# Patient Record
Sex: Male | Born: 1955
Health system: Southern US, Community
[De-identification: ages and names within clinical notes are randomized; demographics above are authoritative.]

## PROBLEM LIST (undated history)

## (undated) DIAGNOSIS — L438 Other lichen planus: Secondary | ICD-10-CM

## (undated) DIAGNOSIS — K219 Gastro-esophageal reflux disease without esophagitis: Secondary | ICD-10-CM

## (undated) DIAGNOSIS — K589 Irritable bowel syndrome without diarrhea: Secondary | ICD-10-CM

## (undated) DIAGNOSIS — I10 Essential (primary) hypertension: Secondary | ICD-10-CM

## (undated) DIAGNOSIS — F1020 Alcohol dependence, uncomplicated: Secondary | ICD-10-CM

## (undated) DIAGNOSIS — R7689 Other specified abnormal immunological findings in serum: Secondary | ICD-10-CM

## (undated) DIAGNOSIS — L400 Psoriasis vulgaris: Secondary | ICD-10-CM

## (undated) DIAGNOSIS — F1910 Other psychoactive substance abuse, uncomplicated: Secondary | ICD-10-CM

## (undated) DIAGNOSIS — G9340 Encephalopathy, unspecified: Secondary | ICD-10-CM

## (undated) DIAGNOSIS — Z9889 Other specified postprocedural states: Secondary | ICD-10-CM

## (undated) DIAGNOSIS — F419 Anxiety disorder, unspecified: Secondary | ICD-10-CM

## (undated) DIAGNOSIS — M3500 Sicca syndrome, unspecified: Secondary | ICD-10-CM

## (undated) DIAGNOSIS — H269 Unspecified cataract: Secondary | ICD-10-CM

## (undated) DIAGNOSIS — D696 Thrombocytopenia, unspecified: Secondary | ICD-10-CM

## (undated) DIAGNOSIS — E7800 Pure hypercholesterolemia, unspecified: Secondary | ICD-10-CM

## (undated) DIAGNOSIS — D72819 Decreased white blood cell count, unspecified: Secondary | ICD-10-CM

## (undated) DIAGNOSIS — B1910 Unspecified viral hepatitis B without hepatic coma: Secondary | ICD-10-CM

## (undated) DIAGNOSIS — F32A Depression, unspecified: Secondary | ICD-10-CM

## (undated) DIAGNOSIS — R768 Other specified abnormal immunological findings in serum: Secondary | ICD-10-CM

## (undated) DIAGNOSIS — Z86010 Personal history of colonic polyps: Secondary | ICD-10-CM

## (undated) DIAGNOSIS — IMO0001 Reserved for inherently not codable concepts without codable children: Secondary | ICD-10-CM

## (undated) DIAGNOSIS — F329 Major depressive disorder, single episode, unspecified: Secondary | ICD-10-CM

## (undated) DIAGNOSIS — T7840XA Allergy, unspecified, initial encounter: Secondary | ICD-10-CM

## (undated) DIAGNOSIS — B1920 Unspecified viral hepatitis C without hepatic coma: Secondary | ICD-10-CM

## (undated) DIAGNOSIS — Z8719 Personal history of other diseases of the digestive system: Secondary | ICD-10-CM

## (undated) DIAGNOSIS — L439 Lichen planus, unspecified: Secondary | ICD-10-CM

## (undated) DIAGNOSIS — M109 Gout, unspecified: Secondary | ICD-10-CM

## (undated) DIAGNOSIS — M539 Dorsopathy, unspecified: Secondary | ICD-10-CM

## (undated) DIAGNOSIS — M25569 Pain in unspecified knee: Secondary | ICD-10-CM

## (undated) DIAGNOSIS — M1990 Unspecified osteoarthritis, unspecified site: Secondary | ICD-10-CM

## (undated) HISTORY — PX: KNEE ARTHROSCOPY: SHX127

## (undated) HISTORY — DX: Unspecified cataract: H26.9

## (undated) HISTORY — DX: Dorsopathy, unspecified: M53.9

## (undated) HISTORY — DX: Personal history of other diseases of the digestive system: Z87.19

## (undated) HISTORY — DX: Essential (primary) hypertension: I10

## (undated) HISTORY — DX: Allergy, unspecified, initial encounter: T78.40XA

## (undated) HISTORY — DX: Other specified abnormal immunological findings in serum: R76.89

## (undated) HISTORY — PX: UPPER GASTROINTESTINAL ENDOSCOPY: SHX188

## (undated) HISTORY — PX: CARPAL TUNNEL RELEASE: SHX101

## (undated) HISTORY — DX: Reserved for inherently not codable concepts without codable children: IMO0001

## (undated) HISTORY — PX: ULNAR NERVE TRANSPOSITION: SHX2595

## (undated) HISTORY — DX: Thrombocytopenia, unspecified: D69.6

## (undated) HISTORY — PX: TONSILLECTOMY: SUR1361

## (undated) HISTORY — DX: Decreased white blood cell count, unspecified: D72.819

## (undated) HISTORY — DX: Irritable bowel syndrome, unspecified: K58.9

## (undated) HISTORY — DX: Personal history of other diseases of the digestive system: Z98.890

## (undated) HISTORY — DX: Depression, unspecified: F32.A

## (undated) HISTORY — DX: Personal history of colonic polyps: Z86.010

## (undated) HISTORY — DX: Lichen planus, unspecified: L43.9

## (undated) HISTORY — PX: OTHER SURGICAL HISTORY: SHX169

## (undated) HISTORY — DX: Alcohol dependence, uncomplicated: F10.20

## (undated) HISTORY — DX: Other lichen planus: L43.8

## (undated) HISTORY — DX: Gastro-esophageal reflux disease without esophagitis: K21.9

## (undated) HISTORY — DX: Pure hypercholesterolemia, unspecified: E78.00

## (undated) HISTORY — DX: Unspecified viral hepatitis B without hepatic coma: B19.10

## (undated) HISTORY — DX: Psoriasis vulgaris: L40.0

## (undated) HISTORY — DX: Other specified abnormal immunological findings in serum: R76.8

## (undated) HISTORY — DX: Major depressive disorder, single episode, unspecified: F32.9

## (undated) HISTORY — PX: FRACTURE SURGERY: SHX138

## (undated) HISTORY — PX: SPINE SURGERY: SHX786

## (undated) HISTORY — DX: Unspecified viral hepatitis C without hepatic coma: B19.20

## (undated) HISTORY — PX: COLONOSCOPY: SHX174

## (undated) HISTORY — PX: APPENDECTOMY: SHX54

## (undated) HISTORY — DX: Other psychoactive substance abuse, uncomplicated: F19.10

## (undated) HISTORY — DX: Anxiety disorder, unspecified: F41.9

## (undated) HISTORY — DX: Unspecified osteoarthritis, unspecified site: M19.90

## (undated) HISTORY — DX: Pain in unspecified knee: M25.569

---
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
## 1977-08-10 DIAGNOSIS — B1920 Unspecified viral hepatitis C without hepatic coma: Secondary | ICD-10-CM

## 1977-08-10 HISTORY — DX: Unspecified viral hepatitis C without hepatic coma: B19.20

## 1998-06-06 ENCOUNTER — Ambulatory Visit (HOSPITAL_BASED_OUTPATIENT_CLINIC_OR_DEPARTMENT_OTHER): Admission: RE | Admit: 1998-06-06 | Discharge: 1998-06-06 | Payer: Self-pay | Admitting: Orthopedic Surgery

## 2003-05-24 ENCOUNTER — Ambulatory Visit (HOSPITAL_COMMUNITY): Admission: RE | Admit: 2003-05-24 | Discharge: 2003-05-24 | Payer: Self-pay | Admitting: *Deleted

## 2003-05-24 ENCOUNTER — Encounter: Payer: Self-pay | Admitting: Otolaryngology

## 2003-08-08 ENCOUNTER — Ambulatory Visit (HOSPITAL_COMMUNITY): Admission: RE | Admit: 2003-08-08 | Discharge: 2003-08-08 | Payer: Self-pay | Admitting: Family Medicine

## 2006-02-14 ENCOUNTER — Emergency Department (HOSPITAL_COMMUNITY): Admission: EM | Admit: 2006-02-14 | Discharge: 2006-02-14 | Payer: Self-pay | Admitting: Emergency Medicine

## 2006-04-01 ENCOUNTER — Ambulatory Visit: Payer: Self-pay | Admitting: Gastroenterology

## 2006-05-11 ENCOUNTER — Encounter (HOSPITAL_COMMUNITY): Admission: RE | Admit: 2006-05-11 | Discharge: 2006-06-10 | Payer: Self-pay | Admitting: Endocrinology

## 2006-08-02 ENCOUNTER — Emergency Department (HOSPITAL_COMMUNITY): Admission: EM | Admit: 2006-08-02 | Discharge: 2006-08-02 | Payer: Self-pay | Admitting: Emergency Medicine

## 2006-08-10 DIAGNOSIS — Z860101 Personal history of adenomatous and serrated colon polyps: Secondary | ICD-10-CM

## 2006-08-10 DIAGNOSIS — Z86010 Personal history of colonic polyps: Secondary | ICD-10-CM

## 2006-08-10 HISTORY — DX: Personal history of adenomatous and serrated colon polyps: Z86.0101

## 2006-08-10 HISTORY — DX: Personal history of colonic polyps: Z86.010

## 2006-09-30 ENCOUNTER — Ambulatory Visit: Payer: Self-pay | Admitting: Gastroenterology

## 2006-10-15 ENCOUNTER — Ambulatory Visit (HOSPITAL_COMMUNITY): Admission: RE | Admit: 2006-10-15 | Discharge: 2006-10-15 | Payer: Self-pay | Admitting: Family Medicine

## 2006-10-20 ENCOUNTER — Ambulatory Visit (HOSPITAL_COMMUNITY): Admission: RE | Admit: 2006-10-20 | Discharge: 2006-10-20 | Payer: Self-pay | Admitting: Family Medicine

## 2006-10-28 ENCOUNTER — Encounter (INDEPENDENT_AMBULATORY_CARE_PROVIDER_SITE_OTHER): Payer: Self-pay | Admitting: *Deleted

## 2006-10-28 ENCOUNTER — Ambulatory Visit (HOSPITAL_COMMUNITY): Admission: RE | Admit: 2006-10-28 | Discharge: 2006-10-28 | Payer: Self-pay | Admitting: Gastroenterology

## 2006-10-30 ENCOUNTER — Emergency Department (HOSPITAL_COMMUNITY): Admission: EM | Admit: 2006-10-30 | Discharge: 2006-10-30 | Payer: Self-pay | Admitting: Emergency Medicine

## 2006-11-04 ENCOUNTER — Ambulatory Visit: Payer: Self-pay | Admitting: Urgent Care

## 2006-11-04 ENCOUNTER — Ambulatory Visit (HOSPITAL_COMMUNITY): Admission: RE | Admit: 2006-11-04 | Discharge: 2006-11-04 | Payer: Self-pay | Admitting: Family Medicine

## 2006-11-09 ENCOUNTER — Ambulatory Visit (HOSPITAL_COMMUNITY): Admission: RE | Admit: 2006-11-09 | Discharge: 2006-11-09 | Payer: Self-pay | Admitting: Gastroenterology

## 2006-11-09 ENCOUNTER — Emergency Department (HOSPITAL_COMMUNITY): Admission: EM | Admit: 2006-11-09 | Discharge: 2006-11-09 | Payer: Self-pay | Admitting: Emergency Medicine

## 2006-11-13 ENCOUNTER — Emergency Department (HOSPITAL_COMMUNITY): Admission: EM | Admit: 2006-11-13 | Discharge: 2006-11-13 | Payer: Self-pay | Admitting: Emergency Medicine

## 2006-11-16 ENCOUNTER — Ambulatory Visit: Payer: Self-pay | Admitting: Gastroenterology

## 2006-11-23 ENCOUNTER — Emergency Department (HOSPITAL_COMMUNITY): Admission: EM | Admit: 2006-11-23 | Discharge: 2006-11-23 | Payer: Self-pay | Admitting: Emergency Medicine

## 2006-11-30 ENCOUNTER — Ambulatory Visit: Payer: Self-pay | Admitting: Gastroenterology

## 2006-11-30 ENCOUNTER — Emergency Department (HOSPITAL_COMMUNITY): Admission: EM | Admit: 2006-11-30 | Discharge: 2006-11-30 | Payer: Self-pay | Admitting: Emergency Medicine

## 2006-12-03 ENCOUNTER — Ambulatory Visit (HOSPITAL_COMMUNITY): Admission: RE | Admit: 2006-12-03 | Discharge: 2006-12-03 | Payer: Self-pay | Admitting: Family Medicine

## 2006-12-07 ENCOUNTER — Ambulatory Visit: Payer: Self-pay | Admitting: Gastroenterology

## 2006-12-08 ENCOUNTER — Ambulatory Visit (HOSPITAL_COMMUNITY): Admission: RE | Admit: 2006-12-08 | Discharge: 2006-12-08 | Payer: Self-pay | Admitting: Gastroenterology

## 2006-12-08 ENCOUNTER — Ambulatory Visit: Payer: Self-pay | Admitting: Gastroenterology

## 2006-12-08 ENCOUNTER — Encounter (INDEPENDENT_AMBULATORY_CARE_PROVIDER_SITE_OTHER): Payer: Self-pay | Admitting: Specialist

## 2007-01-10 ENCOUNTER — Ambulatory Visit: Payer: Self-pay | Admitting: Internal Medicine

## 2007-01-18 ENCOUNTER — Ambulatory Visit: Payer: Self-pay | Admitting: Gastroenterology

## 2007-01-31 ENCOUNTER — Ambulatory Visit (HOSPITAL_COMMUNITY): Admission: RE | Admit: 2007-01-31 | Discharge: 2007-01-31 | Payer: Self-pay | Admitting: Family Medicine

## 2007-02-07 ENCOUNTER — Ambulatory Visit: Payer: Self-pay | Admitting: Gastroenterology

## 2007-02-07 ENCOUNTER — Ambulatory Visit: Payer: Self-pay | Admitting: Orthopedic Surgery

## 2007-02-21 ENCOUNTER — Ambulatory Visit: Payer: Self-pay | Admitting: Orthopedic Surgery

## 2007-02-22 ENCOUNTER — Encounter: Payer: Self-pay | Admitting: Orthopedic Surgery

## 2007-02-24 ENCOUNTER — Encounter (HOSPITAL_COMMUNITY): Admission: RE | Admit: 2007-02-24 | Discharge: 2007-03-26 | Payer: Self-pay | Admitting: Orthopedic Surgery

## 2007-03-10 ENCOUNTER — Ambulatory Visit: Payer: Self-pay | Admitting: Gastroenterology

## 2007-03-15 ENCOUNTER — Ambulatory Visit: Payer: Self-pay | Admitting: Gastroenterology

## 2007-04-05 ENCOUNTER — Ambulatory Visit: Payer: Self-pay | Admitting: Gastroenterology

## 2007-04-26 ENCOUNTER — Ambulatory Visit: Payer: Self-pay | Admitting: Gastroenterology

## 2007-05-03 ENCOUNTER — Ambulatory Visit: Payer: Self-pay | Admitting: Gastroenterology

## 2007-05-24 ENCOUNTER — Ambulatory Visit: Payer: Self-pay | Admitting: Gastroenterology

## 2007-05-25 ENCOUNTER — Ambulatory Visit: Payer: Self-pay | Admitting: Gastroenterology

## 2007-06-05 ENCOUNTER — Emergency Department (HOSPITAL_COMMUNITY): Admission: EM | Admit: 2007-06-05 | Discharge: 2007-06-05 | Payer: Self-pay | Admitting: Emergency Medicine

## 2007-06-16 ENCOUNTER — Ambulatory Visit: Payer: Self-pay | Admitting: Gastroenterology

## 2007-06-23 ENCOUNTER — Ambulatory Visit: Payer: Self-pay | Admitting: Gastroenterology

## 2007-06-30 ENCOUNTER — Ambulatory Visit: Payer: Self-pay | Admitting: Gastroenterology

## 2007-07-06 ENCOUNTER — Ambulatory Visit: Payer: Self-pay | Admitting: Gastroenterology

## 2007-07-18 ENCOUNTER — Ambulatory Visit: Payer: Self-pay | Admitting: Orthopedic Surgery

## 2007-08-05 ENCOUNTER — Ambulatory Visit: Payer: Self-pay | Admitting: Gastroenterology

## 2007-08-11 ENCOUNTER — Ambulatory Visit: Payer: Self-pay | Admitting: Internal Medicine

## 2007-08-17 ENCOUNTER — Ambulatory Visit: Payer: Self-pay | Admitting: Internal Medicine

## 2007-08-30 ENCOUNTER — Telehealth: Payer: Self-pay | Admitting: Orthopedic Surgery

## 2007-09-11 ENCOUNTER — Ambulatory Visit: Payer: Self-pay | Admitting: Internal Medicine

## 2007-09-19 ENCOUNTER — Ambulatory Visit: Payer: Self-pay | Admitting: Orthopedic Surgery

## 2007-10-09 ENCOUNTER — Ambulatory Visit: Payer: Self-pay | Admitting: Internal Medicine

## 2007-11-09 ENCOUNTER — Ambulatory Visit: Payer: Self-pay | Admitting: Internal Medicine

## 2007-12-09 ENCOUNTER — Ambulatory Visit: Payer: Self-pay | Admitting: Internal Medicine

## 2008-01-09 ENCOUNTER — Ambulatory Visit: Payer: Self-pay | Admitting: Internal Medicine

## 2008-01-10 ENCOUNTER — Ambulatory Visit: Payer: Self-pay | Admitting: Gastroenterology

## 2008-02-08 ENCOUNTER — Ambulatory Visit: Payer: Self-pay | Admitting: Internal Medicine

## 2008-03-10 ENCOUNTER — Ambulatory Visit: Payer: Self-pay | Admitting: Internal Medicine

## 2008-04-10 ENCOUNTER — Ambulatory Visit: Payer: Self-pay | Admitting: Internal Medicine

## 2008-05-10 ENCOUNTER — Ambulatory Visit: Payer: Self-pay | Admitting: Internal Medicine

## 2008-06-10 ENCOUNTER — Ambulatory Visit: Payer: Self-pay | Admitting: Internal Medicine

## 2008-07-10 ENCOUNTER — Ambulatory Visit: Payer: Self-pay | Admitting: Internal Medicine

## 2008-10-05 ENCOUNTER — Ambulatory Visit (HOSPITAL_COMMUNITY): Admission: RE | Admit: 2008-10-05 | Discharge: 2008-10-05 | Payer: Self-pay | Admitting: Family Medicine

## 2008-10-05 ENCOUNTER — Encounter: Payer: Self-pay | Admitting: Orthopedic Surgery

## 2008-10-08 ENCOUNTER — Ambulatory Visit: Payer: Self-pay | Admitting: Orthopedic Surgery

## 2008-10-09 ENCOUNTER — Telehealth: Payer: Self-pay | Admitting: Orthopedic Surgery

## 2008-10-22 ENCOUNTER — Telehealth: Payer: Self-pay | Admitting: Orthopedic Surgery

## 2008-10-23 ENCOUNTER — Ambulatory Visit (HOSPITAL_COMMUNITY): Admission: RE | Admit: 2008-10-23 | Discharge: 2008-10-23 | Payer: Self-pay | Admitting: Family Medicine

## 2008-10-30 ENCOUNTER — Ambulatory Visit (HOSPITAL_COMMUNITY): Admission: RE | Admit: 2008-10-30 | Discharge: 2008-10-30 | Payer: Self-pay | Admitting: Family Medicine

## 2008-11-06 ENCOUNTER — Encounter (HOSPITAL_COMMUNITY): Admission: RE | Admit: 2008-11-06 | Discharge: 2008-12-07 | Payer: Self-pay | Admitting: Physician Assistant

## 2008-11-14 ENCOUNTER — Encounter: Payer: Self-pay | Admitting: Gastroenterology

## 2008-11-15 ENCOUNTER — Encounter: Payer: Self-pay | Admitting: Orthopedic Surgery

## 2008-11-19 ENCOUNTER — Ambulatory Visit: Payer: Self-pay | Admitting: Orthopedic Surgery

## 2008-11-26 ENCOUNTER — Telehealth: Payer: Self-pay | Admitting: Orthopedic Surgery

## 2008-12-11 ENCOUNTER — Encounter (HOSPITAL_COMMUNITY): Admission: RE | Admit: 2008-12-11 | Discharge: 2009-01-10 | Payer: Self-pay | Admitting: Physician Assistant

## 2009-01-01 ENCOUNTER — Ambulatory Visit (HOSPITAL_COMMUNITY): Admission: RE | Admit: 2009-01-01 | Discharge: 2009-01-01 | Payer: Self-pay | Admitting: Family Medicine

## 2009-01-02 ENCOUNTER — Ambulatory Visit: Payer: Self-pay | Admitting: Orthopedic Surgery

## 2009-01-03 DIAGNOSIS — K219 Gastro-esophageal reflux disease without esophagitis: Secondary | ICD-10-CM | POA: Insufficient documentation

## 2009-01-04 ENCOUNTER — Ambulatory Visit: Payer: Self-pay | Admitting: Gastroenterology

## 2009-01-04 DIAGNOSIS — B182 Chronic viral hepatitis C: Secondary | ICD-10-CM | POA: Insufficient documentation

## 2009-01-04 DIAGNOSIS — K589 Irritable bowel syndrome without diarrhea: Secondary | ICD-10-CM

## 2009-01-04 HISTORY — DX: Irritable bowel syndrome, unspecified: K58.9

## 2009-01-04 HISTORY — DX: Chronic viral hepatitis C: B18.2

## 2009-01-18 ENCOUNTER — Encounter: Payer: Self-pay | Admitting: Gastroenterology

## 2009-01-31 ENCOUNTER — Encounter: Payer: Self-pay | Admitting: Orthopedic Surgery

## 2009-03-18 ENCOUNTER — Encounter: Admission: RE | Admit: 2009-03-18 | Discharge: 2009-03-18 | Payer: Self-pay | Admitting: Neurosurgery

## 2009-04-11 ENCOUNTER — Ambulatory Visit (HOSPITAL_COMMUNITY): Admission: RE | Admit: 2009-04-11 | Discharge: 2009-04-12 | Payer: Self-pay | Admitting: Neurosurgery

## 2009-04-11 HISTORY — PX: OTHER SURGICAL HISTORY: SHX169

## 2009-07-11 ENCOUNTER — Ambulatory Visit (HOSPITAL_COMMUNITY): Admission: RE | Admit: 2009-07-11 | Discharge: 2009-07-11 | Payer: Self-pay | Admitting: Neurosurgery

## 2009-09-02 ENCOUNTER — Encounter: Payer: Self-pay | Admitting: Urgent Care

## 2009-09-02 ENCOUNTER — Encounter (INDEPENDENT_AMBULATORY_CARE_PROVIDER_SITE_OTHER): Payer: Self-pay | Admitting: *Deleted

## 2009-09-04 ENCOUNTER — Ambulatory Visit: Payer: Self-pay | Admitting: Family Medicine

## 2009-09-04 DIAGNOSIS — F419 Anxiety disorder, unspecified: Secondary | ICD-10-CM | POA: Insufficient documentation

## 2009-09-04 DIAGNOSIS — F329 Major depressive disorder, single episode, unspecified: Secondary | ICD-10-CM | POA: Insufficient documentation

## 2009-09-05 ENCOUNTER — Encounter: Payer: Self-pay | Admitting: Family Medicine

## 2009-09-06 ENCOUNTER — Encounter: Payer: Self-pay | Admitting: Family Medicine

## 2009-09-09 LAB — CONVERTED CEMR LAB
AST: 23 units/L (ref 0–37)
Basophils Relative: 1 % (ref 0–1)
Bilirubin, Direct: 0.1 mg/dL (ref 0.0–0.3)
Calcium: 10.4 mg/dL (ref 8.4–10.5)
Chloride: 105 meq/L (ref 96–112)
Eosinophils Relative: 3 % (ref 0–5)
Glucose, Bld: 119 mg/dL — ABNORMAL HIGH (ref 70–99)
Hemoglobin: 14.5 g/dL (ref 13.0–17.0)
Hgb A1c MFr Bld: 6.1 % (ref 4.6–6.1)
Indirect Bilirubin: 0.2 mg/dL (ref 0.0–0.9)
Lymphocytes Relative: 29 % (ref 12–46)
Lymphs Abs: 1.1 10*3/uL (ref 0.7–4.0)
MCHC: 32.7 g/dL (ref 30.0–36.0)
Monocytes Absolute: 0.3 10*3/uL (ref 0.1–1.0)
Monocytes Relative: 9 % (ref 3–12)
PSA: 0.51 ng/mL (ref 0.10–4.00)
Platelets: 156 10*3/uL (ref 150–400)
Sodium: 145 meq/L (ref 135–145)
Triglycerides: 97 mg/dL (ref <150)
VLDL: 19 mg/dL (ref 0–40)

## 2009-09-17 ENCOUNTER — Ambulatory Visit: Payer: Self-pay | Admitting: Gastroenterology

## 2009-09-18 ENCOUNTER — Ambulatory Visit (HOSPITAL_COMMUNITY): Payer: Self-pay | Admitting: Psychology

## 2009-09-20 ENCOUNTER — Telehealth: Payer: Self-pay | Admitting: Family Medicine

## 2009-09-26 ENCOUNTER — Encounter: Admission: RE | Admit: 2009-09-26 | Discharge: 2009-09-26 | Payer: Self-pay | Admitting: Neurosurgery

## 2009-10-02 ENCOUNTER — Ambulatory Visit (HOSPITAL_COMMUNITY): Payer: Self-pay | Admitting: Psychology

## 2009-11-05 ENCOUNTER — Encounter: Payer: Self-pay | Admitting: Urgent Care

## 2009-11-07 ENCOUNTER — Ambulatory Visit: Payer: Self-pay | Admitting: Family Medicine

## 2009-11-07 DIAGNOSIS — G4700 Insomnia, unspecified: Secondary | ICD-10-CM | POA: Insufficient documentation

## 2009-11-25 ENCOUNTER — Ambulatory Visit (HOSPITAL_COMMUNITY): Payer: Self-pay | Admitting: Psychology

## 2009-12-10 ENCOUNTER — Ambulatory Visit (HOSPITAL_COMMUNITY): Payer: Self-pay | Admitting: Psychology

## 2009-12-11 ENCOUNTER — Encounter (INDEPENDENT_AMBULATORY_CARE_PROVIDER_SITE_OTHER): Payer: Self-pay | Admitting: *Deleted

## 2009-12-13 ENCOUNTER — Telehealth: Payer: Self-pay | Admitting: Family Medicine

## 2009-12-25 ENCOUNTER — Ambulatory Visit: Payer: Self-pay | Admitting: Family Medicine

## 2009-12-26 LAB — CONVERTED CEMR LAB
Eosinophils Absolute: 0.1 10*3/uL (ref 0.0–0.7)
Eosinophils Relative: 2 % (ref 0–5)
HCT: 41.4 % (ref 39.0–52.0)
Hemoglobin: 14.3 g/dL (ref 13.0–17.0)
Lymphocytes Relative: 35 % (ref 12–46)
MCHC: 34.5 g/dL (ref 30.0–36.0)
Monocytes Absolute: 0.4 10*3/uL (ref 0.1–1.0)
Platelets: 165 10*3/uL (ref 150–400)
RBC: 4.46 M/uL (ref 4.22–5.81)
RDW: 13.4 % (ref 11.5–15.5)
Vit D, 25-Hydroxy: 28 ng/mL — ABNORMAL LOW (ref 30–89)
WBC: 5.1 10*3/uL (ref 4.0–10.5)

## 2010-01-02 ENCOUNTER — Ambulatory Visit (HOSPITAL_COMMUNITY): Payer: Self-pay | Admitting: Psychology

## 2010-03-04 ENCOUNTER — Ambulatory Visit (HOSPITAL_COMMUNITY): Payer: Self-pay | Admitting: Psychology

## 2010-03-28 ENCOUNTER — Ambulatory Visit (HOSPITAL_COMMUNITY): Payer: Self-pay | Admitting: Psychology

## 2010-03-28 ENCOUNTER — Telehealth: Payer: Self-pay | Admitting: Gastroenterology

## 2010-04-02 ENCOUNTER — Ambulatory Visit: Payer: Self-pay | Admitting: Gastroenterology

## 2010-04-04 ENCOUNTER — Telehealth: Payer: Self-pay | Admitting: Family Medicine

## 2010-04-10 ENCOUNTER — Ambulatory Visit: Payer: Self-pay | Admitting: Physician Assistant

## 2010-04-10 DIAGNOSIS — R7303 Prediabetes: Secondary | ICD-10-CM | POA: Insufficient documentation

## 2010-04-10 DIAGNOSIS — R259 Unspecified abnormal involuntary movements: Secondary | ICD-10-CM | POA: Insufficient documentation

## 2010-04-10 DIAGNOSIS — E785 Hyperlipidemia, unspecified: Secondary | ICD-10-CM | POA: Insufficient documentation

## 2010-04-10 LAB — CONVERTED CEMR LAB
Alkaline Phosphatase: 60 units/L (ref 39–117)
BUN: 25 mg/dL — ABNORMAL HIGH (ref 6–23)
CO2: 30 meq/L (ref 19–32)
Calcium: 9.6 mg/dL (ref 8.4–10.5)
Creatinine, Ser: 1.15 mg/dL (ref 0.40–1.50)
HDL: 57 mg/dL (ref >39)
Potassium: 5 meq/L (ref 3.5–5.3)
Sodium: 142 meq/L (ref 135–145)
Triglycerides: 146 mg/dL (ref <150)
VLDL: 29 mg/dL (ref 0–40)

## 2010-04-21 ENCOUNTER — Ambulatory Visit: Payer: Self-pay | Admitting: Family Medicine

## 2010-04-22 ENCOUNTER — Encounter: Payer: Self-pay | Admitting: Family Medicine

## 2010-04-24 ENCOUNTER — Telehealth (INDEPENDENT_AMBULATORY_CARE_PROVIDER_SITE_OTHER): Payer: Self-pay | Admitting: *Deleted

## 2010-06-02 ENCOUNTER — Telehealth (INDEPENDENT_AMBULATORY_CARE_PROVIDER_SITE_OTHER): Payer: Self-pay | Admitting: *Deleted

## 2010-06-04 ENCOUNTER — Telehealth: Payer: Self-pay | Admitting: Family Medicine

## 2010-06-23 ENCOUNTER — Telehealth: Payer: Self-pay | Admitting: Family Medicine

## 2010-06-24 ENCOUNTER — Telehealth: Payer: Self-pay | Admitting: Family Medicine

## 2010-08-03 ENCOUNTER — Emergency Department (HOSPITAL_COMMUNITY)
Admission: EM | Admit: 2010-08-03 | Discharge: 2010-08-03 | Payer: Self-pay | Source: Home / Self Care | Admitting: Emergency Medicine

## 2010-08-05 ENCOUNTER — Telehealth (INDEPENDENT_AMBULATORY_CARE_PROVIDER_SITE_OTHER): Payer: Self-pay

## 2010-08-21 ENCOUNTER — Ambulatory Visit: Admit: 2010-08-21 | Payer: Self-pay | Admitting: Family Medicine

## 2010-08-25 ENCOUNTER — Ambulatory Visit
Admission: RE | Admit: 2010-08-25 | Discharge: 2010-08-25 | Payer: Self-pay | Source: Home / Self Care | Attending: Family Medicine | Admitting: Family Medicine

## 2010-08-26 ENCOUNTER — Ambulatory Visit (HOSPITAL_COMMUNITY)
Admission: RE | Admit: 2010-08-26 | Discharge: 2010-08-26 | Payer: Self-pay | Source: Home / Self Care | Attending: Psychiatry | Admitting: Psychiatry

## 2010-08-27 ENCOUNTER — Telehealth: Payer: Self-pay | Admitting: Family Medicine

## 2010-08-31 ENCOUNTER — Encounter: Payer: Self-pay | Admitting: Family Medicine

## 2010-09-01 ENCOUNTER — Ambulatory Visit (HOSPITAL_COMMUNITY)
Admission: RE | Admit: 2010-09-01 | Discharge: 2010-09-01 | Payer: Self-pay | Source: Home / Self Care | Attending: Psychiatry | Admitting: Psychiatry

## 2010-09-06 DIAGNOSIS — I10 Essential (primary) hypertension: Secondary | ICD-10-CM | POA: Insufficient documentation

## 2010-09-09 ENCOUNTER — Ambulatory Visit (HOSPITAL_COMMUNITY)
Admission: RE | Admit: 2010-09-09 | Discharge: 2010-09-09 | Payer: Self-pay | Source: Home / Self Care | Attending: Psychiatry | Admitting: Psychiatry

## 2010-09-09 NOTE — Letter (Signed)
Summary: Recall Office Visit  [facility]  [address]   [place], [place] [address]   Phone: [phone]  Fax: [phone]      Dec 11, 2009   RADFORD PEASE [address] [place], [place]  [address] 1956/08/09   Dear Mr. Loscalzo,   According to our records, it is time for you to schedule a follow-up office visit with [name].   At your convenience, please call [phone] to schedule an office visit. If you have any questions, concerns, or feel that this letter is in error, we would appreciate your call.   Sincerely,    Diana Eves  [facility] Ph: [phone]   Fax: [phone]

## 2010-09-09 NOTE — Progress Notes (Signed)
  Phone Note Call from Patient   Caller: Patient Reason for Call: Refill Medication Summary of Call: pt has appointment on weds and is out of his medication, Dicyclomine HCL, pharmacy is [facility] Initial call taken by: Rosine Beat,  March 28, 2010 11:42 AM    Prescriptions: DICYCLOMINE HCL 10 MG CAPS (DICYCLOMINE HCL) Take two capsules PO in am, one at lunch,  and 2 capsules at bedtime as needed for abdominal pain.  #150 x 1   Entered and Authorized by:   Leanna Battles. Dixon Boos   Signed by:   Leanna Battles Dixon Boos on 03/28/2010   Method used:   Electronically to        [facility] (retail)       [address]       [place], [place]  [address]       Ph: [phone]       Fax: [phone]   RxID:   [id number]

## 2010-09-09 NOTE — Progress Notes (Signed)
Summary: SNRI   Phone Note Call from Patient   Summary of Call: SAID THAT DR. HEDGECOCK AT [facility] THAT HIS PCP LOOK INTO RX HIM A SNRI FOR MOOD AND PAIN CALL BACK AT [phone] Initial call taken by: Lind Guest,  June 23, 2010 1:40 PM  Follow-up for Phone Call        med changeswill be discussed at next ov, pls let him know, i am happy to consider at that time Follow-up by: Syliva Overman MD,  June 23, 2010 5:44 PM  Additional Follow-up for Phone Call Additional follow up Details #1::        patient aware  appt in january Additional Follow-up by: Lind Guest,  June 24, 2010 8:21 AM

## 2010-09-09 NOTE — Assessment & Plan Note (Signed)
Summary: TREMORS?- ROOM 2   Vital Signs:  Patient profile:   55 year old male Height:      71 inches Weight:      155.25 pounds BMI:     21.73 O2 Sat:      98 % on Room air Pulse rate:   90 / minute Resp:     16 per minute BP sitting:   116 / 78  (left arm)  Vitals Entered By: Adella Hare LPN (April 10, 2010 2:51 PM) CC: TREMORS, TWITCHING? Is Patient Diabetic? No Pain Assessment Patient in pain? no      Comments DID NOT BRING MEDS TO OV   Primary Provider:  Lodema Hong, M.D.  CC:  TREMORS and TWITCHING?Marland Kitchen  History of Present Illness: Pt presents today concerned about involuntary twitching that occurs in his extremities x approx 1 yr.  Has seen Dr Rhona Leavens in the past for Essential Tremors but has been unhappy with his care.  He feels that this is getting worse too.  Hx of cervical spinal fusion. He was mentioning the twitching to his pain dr and they suggested evaluation.  Pt states he notices the twitches more at rest, but also has them with activity.  Has been doing yoga and notices an increase during the relaxation phase after yoga exercises.  He is uncertain if this is because he is relaxing and more aware,or if because of the exercises.  Pt also asks about a general test for screening for cancers.  Advised pt that there is not one test for all cancers.  He did have a PSA  in Jan 2011 and was nl. Pt has appt in 2 weeks for check up.  He is due for labs also.   Pt states he had a head CT in the last 1 yr after his fall and head injury.    Allergies (verified): 1)  ! Levaquin  Past History:  Past medical history reviewed for relevance to current acute and chronic problems.  Past Medical History: Reviewed history from 09/04/2009 and no changes required. Hepatitis C   dx in 1979,. treated with rivabarin and iterferon treated for 15 months, pt states in OCT 2009 he was dx with reactivated hep C , he should have been to [facility] for treatment but due to ill health of his  parebnts got put off back problems gerd Depression IBS  Review of Systems General:  Denies fatigue and weakness. CV:  Denies chest pain or discomfort and lightheadness. MS:  Denies joint pain and muscle weakness. Neuro:  Complains of tremors; denies headaches, numbness, and tingling.  Physical Exam  General:  Well-developed,well-nourished,in no acute distress; alert,appropriate and cooperative throughout examination Head:  Normocephalic and atraumatic without obvious abnormalities. No apparent alopecia or balding. Eyes:  pupils equal, pupils round, pupils reactive to light, and no injection.   Ears:  External ear exam shows no significant lesions or deformities.  Otoscopic examination reveals clear canals, tympanic membranes are intact bilaterally without bulging, retraction, inflammation or discharge. Hearing is grossly normal bilaterally. Nose:  External nasal examination shows no deformity or inflammation. Nasal mucosa are pink and moist without lesions or exudates. Mouth:  Oral mucosa and oropharynx without lesions or exudates.   Neck:  No deformities, masses, or tenderness noted. Lungs:  Normal respiratory effort, chest expands symmetrically. Lungs are clear to auscultation, no crackles or wheezes. Heart:  Normal rate and regular rhythm. S1 and S2 normal without gallop, murmur, click, rub or other extra sounds. Pulses:  R radial normal and L radial normal.   Extremities:  No clubbing, cyanosis, edema, or deformity noted with normal full range of motion of all joints.   Neurologic:  alert & oriented X3, cranial nerves II-XII intact, strength normal in all extremities, sensation intact to light touch, gait normal, and DTRs symmetrical and normal.    During visit one involuntary mvmt of LLE noted.  This was one quick jumping mvmt. Cervical Nodes:  No lymphadenopathy noted Psych:  Cognition and judgment appear intact. Alert and cooperative with normal attention span and concentration.  No apparent delusions, illusions, hallucinations   Impression & Recommendations:  Problem # 1:  ABNORMAL INVOLUNTARY MOVEMENTS (ICD-781.0) Assessment New  Orders: Neurology Referral (Neuro)  Problem # 2:  PRE-DIABETES (NUU-725.36) Assessment: Comment Only  Orders: T-Comprehensive Metabolic Panel ([id number]) T- Hemoglobin A1C ([id number])  Complete Medication List: 1)  Dicyclomine Hcl 10 Mg Caps (Dicyclomine hcl) .... Take two capsules po in am, one at lunch,  and 2 capsules at bedtime as needed for abdominal pain. 2)  Ibuprofen 200 Mg Tabs (Ibuprofen) .... As needed 3)  Tylenol 325 Mg Tabs (Acetaminophen) .... As needed 4)  Prilosec Otc  .... Take 1 tablet by mouth once a day 5)  Carisoprodol 350 Mg Tabs (Carisoprodol) .... Qid 6)  Klonopin 0.5 Mg Tabs (Clonazepam) .... Take 1 tablet by mouth once a day 7)  Lovastatin 40 Mg Tabs (Lovastatin) .... Take 1 tab by mouth at bedtime 8)  Stool Softener 100 Mg Caps (Docusate sodium) .... Once daily 9)  Celexa 20 Mg Tabs (Citalopram hydrobromide) .... One tab by mouth once daily 10)  Bupropion Hcl 300 Mg Xr24h-tab (Bupropion hcl) .... Take 1 capsule by mouth once a day 11)  Kadian 20 Mg Xr24h-cap (Morphine sulfate) .... Two times a day 12)  Morphine Sulfate 15 Mg Tabs (Morphine sulfate) .... Two times a day 13)  Align Caps (Probiotic product) .... Once daily 14)  Benefiber Powd (Wheat dextrin) .... Once daily  Other Orders: T-Lipid Profile ([id number])  Patient Instructions: 1)  Keep your next appt with DrSimpson. 2)  I have ordered blood work.  Have this drawn before your next appt. 3)  I am referring you to a different neurologist. Pt declined flu vac today. Esperanza Sheets PA  April 10, 2010 3:40 PM

## 2010-09-09 NOTE — Progress Notes (Signed)
Summary: stomach bug  Phone Note Call from Patient   Summary of Call: had a stomach bug since last thursday  dia and fatigue sorta nervous and weak does not feel like eatting anything and has felt nausea  could you call something in at [facility]  had a fever thursday or friday with chills but has not had 1 since  call back at [phone] Initial call taken by: Lind Guest,  June 24, 2010 8:23 AM  Follow-up for Phone Call        advise BRAT diet , i will send phenergan for nausea, if hwe has fever or significant resp symptoms still then urgent care or ed , or   [facility] Follow-up by: Adella Hare LPN,  June 25, 2010 12:14 PM  Additional Follow-up for Phone Call Additional follow up Details #1::        Phone Call Completed Additional Follow-up by: Adella Hare LPN,  June 26, 2010 1:58 PM    New/Updated Medications: PROMETHAZINE HCL 12.5 MG TABS (PROMETHAZINE HCL) Take 1 tablet by mouth two times a day as needed for nausea Prescriptions: PROMETHAZINE HCL 12.5 MG TABS (PROMETHAZINE HCL) Take 1 tablet by mouth two times a day as needed for nausea  #15 x 0   Entered and Authorized by:   Syliva Overman MD   Signed by:   Syliva Overman MD on 06/26/2010   Method used:   Electronically to        [facility] (retail)       [address]       [place], [place]  [address]       Ph: [phone]       Fax: [phone]   RxID:   [id number]

## 2010-09-09 NOTE — Progress Notes (Signed)
Summary: LAB WORK  Phone Note Call from Patient   Summary of Call: WANTS SOMETHING ADDED TO HIS LAB WORK CALL BACK AT [phone] Initial call taken by: Lind Guest,  Dec 13, 2009 9:47 AM  Follow-up for Phone Call        returned call, left message Follow-up by: Adella Hare LPN,  Dec 14, 4538 10:01 AM  Additional Follow-up for Phone Call Additional follow up Details #1::        patient hep c and was treated  wants to know can he have viral load added to bloodwork Additional Follow-up by: Adella Hare LPN,  Dec 13, 9809 10:21 AM    Additional Follow-up for Phone Call Additional follow up Details #2::    if it is for his hep c this needs to be ordered by the specialist following him, if not currently seeing one he will need to get back to he clinic if he is concerned about the infection Follow-up by: Syliva Overman MD,  Dec 13, 2009 10:48 AM  Additional Follow-up for Phone Call Additional follow up Details #3:: Details for Additional Follow-up Action Taken: patient aware Additional Follow-up by: Adella Hare LPN,  Dec 13, 9145 4:30 PM

## 2010-09-09 NOTE — Progress Notes (Signed)
  Phone Note Call from Patient   Caller: Patient Summary of Call: patient states he fell and hit his head four days ago and for the last two days he has had a headache and nausea, states he is forgetting things such as money, wallet, keys, etc. wants to know what you suggest. states the headache makes no since due to the pain meds he is already on  Initial call taken by: Adella Hare LPN,  June 04, 2010 1:05 PM  Follow-up for Phone Call        needs ov for further eval pls sched if pain very severe go to the ED Follow-up by: Syliva Overman MD,  June 04, 2010 5:12 PM  Additional Follow-up for Phone Call Additional follow up Details #1::        APPT. 10.27.11 Additional Follow-up by: Lind Guest,  June 04, 2010 5:22 PM

## 2010-09-09 NOTE — Progress Notes (Signed)
Summary: meds  Phone Note Call from Patient   Summary of Call: pt statezs dr. Shelva Majestic should be call or sending information over for doc to give him a medicine, pt hasn't heard anything. [phone] Initial call taken by: Rudene Anda,  September 20, 2009 10:13 AM  Follow-up for Phone Call        I have no msg from Dr Shelva Majestic , let him know, he can call back to Dr. Shelva Majestic to remind him Follow-up by: Syliva Overman MD,  September 20, 2009 12:21 PM  Additional Follow-up for Phone Call Additional follow up Details #1::        Used to be on Celexa in the past. He was waiting on [place] to send you a message about this. I told him I would let you know what it was about. He has trouble sleeping for more than an hour at night and he said the Celexa really helped him in the past  [facility] .  Additional Follow-up by: Everitt Amber,  September 20, 2009 2:55 PM    Additional Follow-up for Phone Call Additional follow up Details #2::    pls advise and erx celexa 20mg  Take 1 tablet by mouth once a day #30 refill x3 Follow-up by: Syliva Overman MD,  September 23, 2009 12:43 AM  Additional Follow-up for Phone Call Additional follow up Details #3:: Details for Additional Follow-up Action Taken: patient aware Additional Follow-up by: Lilyan Gilford LPN,  September 23, 2009 9:44 AM  New/Updated Medications: CELEXA 20 MG TABS (CITALOPRAM HYDROBROMIDE) one tab by mouth once daily Prescriptions: CELEXA 20 MG TABS (CITALOPRAM HYDROBROMIDE) one tab by mouth once daily  #30 x 3   Entered by:   Lilyan Gilford LPN   Authorized by:   Syliva Overman MD   Signed by:   Lilyan Gilford LPN on 09/81/1914   Method used:   Electronically to        [facility] (retail)       [address]       [place], [place]  [address]       Ph: [phone]       Fax: [phone]   RxID:   [id number]

## 2010-09-09 NOTE — Progress Notes (Signed)
  Phone Note Call from Patient   Summary of Call: ran into doorway on Sat, eyebrow bled but not bleeding now.  Does he need to come in to be seen. [phone] Initial call taken by: Rosine Beat,  June 02, 2010 10:53 AM  Follow-up for Phone Call        since not bleeding and injury is over 24 hours old it needs to heal on its own. I do not see that his tetanus is up to daTR, UNLESS HE IS SURE HE HAS HAD ONE IN THE PAST 10 YEARS HE NEEEDS NURSE VISIT ONLY FOR TDAP ONLY LET HIM KNOW HE CAN COME TODAY IF NEEDED Follow-up by: Syliva Overman MD,  June 02, 2010 12:00 PM  Additional Follow-up for Phone Call Additional follow up Details #1::        Called patient, left message Additional Follow-up by: Mauricia Area CMA,  June 02, 2010 1:38 PM    Additional Follow-up for Phone Call Additional follow up Details #2::    LEFT MESSAGE CALL BACK  Follow-up by: Lind Guest,  June 02, 2010 2:28 PM   patient aware Adella Hare LPN  June 02, 2010 2:35 PM

## 2010-09-09 NOTE — Medication Information (Signed)
Summary: Tax adviser   Imported By: Ricard Dillon 11/05/2009 08:45:42  _____________________________________________________________________  External Attachment:    Type:   Image     Comment:   External Document  Appended Document: RX FolderDICYCLOMINE    Prescriptions: DICYCLOMINE HCL 10 MG CAPS (DICYCLOMINE HCL) Take two capsules PO in am, one at lunch,  and 2 capsules at bedtime as needed for abdominal pain.  #150 x 3   Entered and Authorized by:   Joselyn Arrow FNP-BC   Signed by:   Joselyn Arrow FNP-BC on 11/05/2009   Method used:   Electronically to        [facility] (retail)       [address]       [place], [place]  [address]       Ph: [phone]       Fax: [phone]   RxID:   [id number]

## 2010-09-09 NOTE — Progress Notes (Signed)
Summary: appt referral  Phone Note Call from Patient   Summary of Call: pt has appt with dr. Lolly Mustache for 05/13/2010 11:45.  left message  Initial call taken by: Rudene Anda,  April 24, 2010 4:34 PM

## 2010-09-09 NOTE — Assessment & Plan Note (Signed)
Summary: IBS, GERD, HCV   Visit Type:  Follow-up Visit Primary Care Provider:  Lodema Hong, M.D.  Chief Complaint:  follow up.  History of Present Illness: Taking pain meds for chronic pain. Taking Align, stool softener, MLX, and benefiber. Thinks its helping. Denies constipation. Has cramps and nausea:3x/week and once he goes it seems to be better. Taking Mg for sleep. Not increasing cramps. Lovastatin helps him sleep. No need for refills. equesting Align samples.   Current Medications (verified): 1)  Dicyclomine Hcl 10 Mg Caps (Dicyclomine Hcl) .... Take Two Capsules Po in Am, One At Lunch,  and 2 Capsules At Bedtime As Needed For Abdominal Pain. 2)  Ibuprofen 200 Mg Tabs (Ibuprofen) .... As Needed 3)  Tylenol 325 Mg Tabs (Acetaminophen) .... As Needed 4)  Prilosec Otc .... Take 1 Tablet By Mouth Once A Day 5)  Carisoprodol 350 Mg Tabs (Carisoprodol) .... Qid 6)  Klonopin 0.5 Mg Tabs (Clonazepam) .... Take 1 Tablet By Mouth Once A Day 7)  Lovastatin 40 Mg Tabs (Lovastatin) .... Take 1 Tab By Mouth At Bedtime 8)  Stool Softener 100 Mg Caps (Docusate Sodium) .... Once Daily 9)  Celexa 20 Mg Tabs (Citalopram Hydrobromide) .... One Tab By Mouth Once Daily 10)  Bupropion Hcl 300 Mg Xr24h-Tab (Bupropion Hcl) .... Take 1 Capsule By Mouth Once A Day 11)  Kadian 20 Mg Xr24h-Cap (Morphine Sulfate) .... Two Times A Day 12)  Morphine Sulfate 15 Mg Tabs (Morphine Sulfate) .... Two Times A Day 13)  Align  Caps (Probiotic Product) .... Once Daily 14)  Benefiber  Powd (Wheat Dextrin) .... Once Daily  Allergies (verified): 1)  ! Levaquin  Past History:  Past Medical History: Last updated: 09/04/2009 Hepatitis C   dx in 1979,. treated with rivabarin and iterferon treated for 15 months, pt states in OCT 2009 he was dx with reactivated hep C , he [facility] have been to [facility] treatment but due to ill health of his parebnts got put off back problems gerd Depression IBS  Past Surgical History: Last  updated: 09/04/2009 Appendectomy Tonsillectomy carpal tunnel release  not by Dr. Romeo Apple  Right ring finger surgery Sept 2, 2010- DDD Spinal stenosis, had screws put in neck  dr Gerlene Fee  Vital Signs:  Patient profile:   55 year old male Height:      71 inches Weight:      155 pounds BMI:     21.70 Temp:     98.2 degrees F oral Pulse rate:   80 / minute BP sitting:   120 / 84  (left arm) Cuff size:   regular  Vitals Entered By: Hendricks Limes LPN (April 02, 2010 4:27 PM)  Physical Exam  General:  Well developed, well nourished, no acute distress. Head:  Normocephalic and atraumatic. Lungs:  Clear throughout to auscultation. Heart:  Regular rate and rhythm; no murmurs. Abdomen:  Soft, nontender and nondistended. Normal bowel sounds.  Impression & Recommendations:  Problem # 1:  IRRITABLE BOWEL SYNDROME (ICD-564.1) Minimize use of Bentyl. Continue Align, MLX, and Benefiber.  Problem # 2:  GERD (ICD-530.81) Assessment: Improved fairly well controlled. Continue OMP. OPV in 6 mos.  Problem # 3:  HEPATITIS C, CHRONIC (ICD-070.54) Assessment: Unchanged Awaiting appt at [facility]. HFP today.  CC: PCP  Appended Document: Orders Update    Clinical Lists Changes  Orders: Added new Service order of Est. Patient Level II (72536) - Signed      Appended Document: IBS, GERD, HCV 6 MONTH F/U  OPV IS IN THE COMPUTER

## 2010-09-09 NOTE — Assessment & Plan Note (Signed)
Summary: OV   Vital Signs:  Patient profile:   55 year old male Height:      71 inches Weight:      151 pounds BMI:     21.14 O2 Sat:      98 % Temp:     98.4 degrees F oral Pulse rate:   80 / minute Pulse rhythm:   regular Resp:     16 per minute BP sitting:   124 / 88  (left arm) Cuff size:   regular  Vitals Entered By: Everitt Amber LPN (Dec 25, 2009 9:25 AM) CC: started feeling bad Sunday, nauseated, achy, chills, headache in forehead and sinus areaaches and temperature of up to 101. No fever today   Primary Care Provider:  Syliva Overman  CC:  started feeling bad Sunday, nauseated, achy, chills, and headache in forehead and sinus areaaches and temperature of up to 101. No fever today.  History of Present Illness: Pt states that he ahas had generalised aches with fatigue weaknesschills and poor apetitie in th past 3 to 4 days. His drainage from the nose has started clearing , it was yellow but is now thinner and clearer. He states his sepression is somewhat better, but he has definitely noted that too often he feels "bluse" still and misses his Mom terribly.  Current Medications (verified): 1)  Dicyclomine Hcl 10 Mg Caps (Dicyclomine Hcl) .... Take Two Capsules Po in Am, One At Lunch,  and 2 Capsules At Bedtime As Needed For Abdominal Pain. 2)  Lortab 5 5-500 Mg Tabs (Hydrocodone-Acetaminophen) .Marland Kitchen.. 1-2 Tabs Q 4-6 Hrs As Needed 3)  Ibuprofen 200 Mg Tabs (Ibuprofen) .... As Needed 4)  Tylenol 325 Mg Tabs (Acetaminophen) .... As Needed 5)  Prilosec Otc .... Take 1 Tablet By Mouth Once A Day 6)  Carisoprodol 350 Mg Tabs (Carisoprodol) .... Qid 7)  Klonopin 0.5 Mg Tabs (Clonazepam) .... Take 1 Tablet By Mouth Once A Day 8)  Lovastatin 40 Mg Tabs (Lovastatin) .... Take 1 Tab By Mouth At Bedtime 9)  Stool Softener 100 Mg Caps (Docusate Sodium) .... Once Daily 10)  Metamucil 30.9 % Powd (Psyllium) .... Once Daily 11)  Celexa 20 Mg Tabs (Citalopram Hydrobromide) .... One Tab By  Mouth Once Daily 12)  Bupropion Hcl 150 Mg Xr12h-Tab (Bupropion Hcl) .... Take 1 Tablet By Mouth Once A Day  Allergies (verified): 1)  ! Levaquin  Review of Systems      See HPI General:  Complains of chills, fatigue, malaise, sweats, and weakness; denies fever; 3 day history. Eyes:  Denies discharge and red eye. ENT:  Complains of nasal congestion, postnasal drainage, sinus pressure, and sore throat; 4 day history, seems to be recovering. CV:  Denies chest pain or discomfort, palpitations, and swelling of feet. Resp:  Complains of cough; chest congestion and cough, clear sputum. GI:  Denies abdominal pain, constipation, diarrhea, nausea, and vomiting. GU:  Denies dysuria, urinary frequency, and urinary hesitancy. MS:  Complains of thoracic pain; neck pain , had an appt to see pain specialist then had to lv without being seen, in the process of establishing new care. Derm:  Denies lesion(s) and rash. Neuro:  Complains of headaches; denies falling down, numbness, poor balance, seizures, sensation of room spinning, and tingling; headqche associated with current viral symptoms. Psych:  Complains of anxiety, depression, and mental problems; denies suicidal thoughts/plans, thoughts of violence, and unusual visions or sounds; still in therapy which is being helpful, but grieving is prolonged. Endo:  Denies cold intolerance, excessive thirst, excessive urination, and heat intolerance. Heme:  Denies abnormal bruising and bleeding. Allergy:  Denies hives or rash and itching eyes.  Physical Exam  General:  Well-developed,well-nourished,in no acute distress; alert,appropriate and cooperative throughout examination HEENT: No facial asymmetry,  EOMI, No sinus tenderness, TM's Clear, oropharynx  pink and moist.   Chest: Clear to auscultation bilaterally.  CVS: S1, S2, No murmurs, No S3.   Abd: Soft, Nontender.  MS: Adequate ROM spine, hips, shoulders and knees.  Ext: No edema.   CNS: CN 2-12  intact, power tone and sensation normal throughout.   Skin: Intact, no visible lesions or rashes.  Psych: Good eye contact,flat affect.  Memory intact, not anxious but depressed appearing.     Impression & Recommendations:  Problem # 1:  VIRAL URI (ICD-465.9) Assessment Comment Only  His updated medication list for this problem includes:    Ibuprofen 200 Mg Tabs (Ibuprofen) .Marland Kitchen... As needed    Tylenol 325 Mg Tabs (Acetaminophen) .Marland Kitchen... As needed  Instructed on symptomatic treatment. Call if symptoms persist or worsen.   Problem # 2:  DEPRESSION (ICD-311) Assessment: Unchanged  Labs Reviewed: Creat: 1.07 (09/05/2009)     The following medications were removed from the medication list:    Bupropion Hcl 150 Mg Xr12h-tab (Bupropion hcl) .Marland Kitchen... Take 1 tablet by mouth once a day His updated medication list for this problem includes:    Klonopin 0.5 Mg Tabs (Clonazepam) .Marland Kitchen... Take 1 tablet by mouth once a day    Celexa 20 Mg Tabs (Citalopram hydrobromide) ..... One tab by mouth once daily    Bupropion Hcl 300 Mg Xr24h-tab (Bupropion hcl) .Marland Kitchen... Take 1 capsule by mouth once a day  Problem # 3:  GERD (ICD-530.81) Assessment: Improved  His updated medication list for this problem includes:    Dicyclomine Hcl 10 Mg Caps (Dicyclomine hcl) .Marland Kitchen... Take two capsules po in am, one at lunch,  and 2 capsules at bedtime as needed for abdominal pain.  Complete Medication List: 1)  Dicyclomine Hcl 10 Mg Caps (Dicyclomine hcl) .... Take two capsules po in am, one at lunch,  and 2 capsules at bedtime as needed for abdominal pain. 2)  Lortab 5 5-500 Mg Tabs (Hydrocodone-acetaminophen) .Marland Kitchen.. 1-2 tabs q 4-6 hrs as needed 3)  Ibuprofen 200 Mg Tabs (Ibuprofen) .... As needed 4)  Tylenol 325 Mg Tabs (Acetaminophen) .... As needed 5)  Prilosec Otc  .... Take 1 tablet by mouth once a day 6)  Carisoprodol 350 Mg Tabs (Carisoprodol) .... Qid 7)  Klonopin 0.5 Mg Tabs (Clonazepam) .... Take 1 tablet by mouth once  a day 8)  Lovastatin 40 Mg Tabs (Lovastatin) .... Take 1 tab by mouth at bedtime 9)  Stool Softener 100 Mg Caps (Docusate sodium) .... Once daily 10)  Metamucil 30.9 % Powd (Psyllium) .... Once daily 11)  Celexa 20 Mg Tabs (Citalopram hydrobromide) .... One tab by mouth once daily 12)  Bupropion Hcl 300 Mg Xr24h-tab (Bupropion hcl) .... Take 1 capsule by mouth once a day  Other Orders: T-CBC w/Diff ([id number]) T- Hemoglobin A1C ([id number]) T-Vitamin D (25-Hydroxy) ([id number])  Patient Instructions: 1)  cancel appt in end May. 2)  sched f/u in 3.5 months pls. 3)  hBA1c and cbc and diff and vit D today. 4)  Get plenty of rest, drink lots of clear liquids, and use Tylenol or Ibuprofen for fever and comfort. Return in 7-10 days if you're not better:sooner if you're feeling worse. 5)  increased dose of bupropion start two 150mg  tabs daily as of today, new med is 300mg  one daily Prescriptions: BUPROPION HCL 300 MG XR24H-TAB (BUPROPION HCL) Take 1 capsule by mouth once a day  #30 x 3   Entered and Authorized by:   Syliva Overman MD   Signed by:   Syliva Overman MD on 12/25/2009   Method used:   Printed then faxed to ...       [facility] (retail)       [address]       [place], [place]  [address]       Ph: [phone]       Fax: [phone]   RxID:   [id number]

## 2010-09-09 NOTE — Letter (Signed)
Summary: Scheduled Appointment  [facility]  [address]   [place], [place] [address]   Phone: [phone]  Fax: [phone]    September 02, 2009   Dear: Dionne Ano Cousins            DOB: 01/20/1956    I have been instructed to schedule you an appointment in our office.  Your appointment is as follows:   Date:         September 17, 2009   Time:        10 AM   Please be here 15 minutes early.   Provider:   Unice Bailey.    Please contact the office if you need to reschedule this appointment for a more convenient time.   Thank you,    Diana Eves       [facility] Ph: [phone]   Fax: [phone]

## 2010-09-09 NOTE — Assessment & Plan Note (Signed)
Summary: office visit   Vital Signs:  Patient profile:   55 year old male Height:      71 inches Weight:      160.50 pounds BMI:     22.47 O2 Sat:      98 % Pulse rate:   72 / minute Resp:     16 per minute BP sitting:   112 / 82  (left arm) Cuff size:   regular  Vitals Entered By: Everitt Amber LPN (April 21, 2010 3:06 PM) CC: left hand has been cramping up for about 2 weeks, has been alittle depressed for about a month now and his tremors have gotten worse   Primary Care Provider:  Lodema Hong, M.D.  CC:  left hand has been cramping up for about 2 weeks and has been alittle depressed for about a month now and his tremors have gotten worse.  History of Present Illness: Pt now in a pain clinic rather than neurosurgeon for managetmentof his chronic pain, he is now on long acting and short  acting morphine which is doing better, also doing yoga and deep tissue  massages. He continues to feel significantly depressed, deniesbeing suicidal or homicidal, experiencing prolonged grief following his mother's death almost 1 year ago. He will clearly benefit from psych involvement, with med management. He has been seeing a therapist for several mths, but feels as though the benefit has ceased. He has a h/o colon polyp, states he was told he would need a re[pt colonscopy in 5 yrs and this is [past due. He has a fam h/o colon and bladder ca, denies hematuria. He has changedhis lifestyle, and is here to review labs both regarding cholesterol as well as prediabetes, there is significant improvement in both.  Pt reports he is still awaiting appt with the hepatitis clinic at [facility]  Allergies: 1)  ! Levaquin  Review of Systems      See HPI General:  Complains of fatigue and malaise; denies chills and fever. Eyes:  Denies discharge, eye pain, and red eye. ENT:  Denies hoarseness, nasal congestion, and sinus pressure. CV:  Denies chest pain or discomfort, palpitations, and swelling of feet. Resp:   Denies cough, shortness of breath, and sputum productive. GI:  Denies abdominal pain, constipation, diarrhea, nausea, and vomiting. GU:  Denies dysuria, incontinence, and urinary frequency. MS:  Complains of joint pain, low back pain, mid back pain, and stiffness. Derm:  Denies itching, lesion(s), and rash. Neuro:  Complains of tremors; states tremors are worsening, esp when around people, asnd he has also developed twitchesin the past  1 year.sees Dr Ninetta Lights x 28yrs wants another opinion. Psych:  Complains of anxiety, depression, and mental problems; denies suicidal thoughts/plans, thoughts of violence, and unusual visions or sounds. Endo:  Denies excessive thirst and excessive urination. Heme:  Denies abnormal bruising and bleeding. Allergy:  Denies hives or rash and itching eyes.  Physical Exam  General:  Well-developed,well-nourished,in no acute distress; alert,appropriate and cooperative throughout examination HEENT: No facial asymmetry,  EOMI, No sinus tenderness, TM's Clear, oropharynx  pink and moist.   Chest: Clear to auscultation bilaterally.  CVS: S1, S2, No murmurs, No S3.   Abd: Soft, Nontender.  MS: Adequate ROM spine, hips, shoulders and knees.  Ext: No edema.   CNS: CN 2-12 intact, power tone and sensation normal throughout.   Skin: Intact, no visible lesions or rashes.  Psych: Good eye contact, flatl affect.  Memory intact, not anxious but  depressed appearing.  Impression & Recommendations:  Problem # 1:  IMPAIRED FASTING GLUCOSE (ICD-790.21) Assessment Improved  Orders: T- Hemoglobin A1C ([id number]) Pt advised to reduce carbohydrate intake, espescially sweets, and to start regular physical activity, at least 30 minutes 5 days weekly, to enable weight loss, and reduce the risk of becoming diabetic   Problem # 2:  TREMOR (ICD-781.0) Assessment: Deteriorated  Orders: Neurology Referral (Neuro), pt wants second opinion  Problem # 3:  HYPERLIPIDEMIA  (ICD-272.4) Assessment: Improved  His updated medication list for this problem includes:    Lovastatin 40 Mg Tabs (Lovastatin) .Marland Kitchen... Take 1 tab by mouth at bedtime Low fat dietdiscussed and encouraged   Orders: T-Lipid Profile ([id number]) T-Hepatic Function [phone])  Labs Reviewed: SGOT: 22 (04/10/2010)   SGPT: 37 (04/10/2010)   HDL:57 (04/10/2010), 47 (09/05/2009)  LDL:108 (04/10/2010), 158 (09/05/2009)  Chol:194 (04/10/2010), 224 (09/05/2009)  Trig:146 (04/10/2010), 97 (09/05/2009)  Problem # 4:  DEPRESSION (ICD-311) Assessment: Unchanged  The following medications were removed from the medication list:    Celexa 20 Mg Tabs (Citalopram hydrobromide) ..... One tab by mouth once daily His updated medication list for this problem includes:    Klonopin 0.5 Mg Tabs (Clonazepam) .Marland Kitchen... Take 1 tablet by mouth once a day    Bupropion Hcl 300 Mg Xr24h-tab (Bupropion hcl) .Marland Kitchen... Take 1 capsule by mouth once a day    Citalopram Hydrobromide 40 Mg Tabs (Citalopram hydrobromide) .Marland Kitchen... Take 1 tablet by mouth once a day  Orders: Psychiatric Referral (Psych)  Complete Medication List: 1)  Dicyclomine Hcl 10 Mg Caps (Dicyclomine hcl) .... Take two capsules po in am, one at lunch,  and 2 capsules at bedtime as needed for abdominal pain. 2)  Ibuprofen 200 Mg Tabs (Ibuprofen) .... As needed 3)  Tylenol 325 Mg Tabs (Acetaminophen) .... As needed 4)  Prilosec Otc  .... Take 1 tablet by mouth once a day 5)  Carisoprodol 350 Mg Tabs (Carisoprodol) .... Qid 6)  Klonopin 0.5 Mg Tabs (Clonazepam) .... Take 1 tablet by mouth once a day 7)  Lovastatin 40 Mg Tabs (Lovastatin) .... Take 1 tab by mouth at bedtime 8)  Stool Softener 100 Mg Caps (Docusate sodium) .... Once daily 9)  Bupropion Hcl 300 Mg Xr24h-tab (Bupropion hcl) .... Take 1 capsule by mouth once a day 10)  Kadian 20 Mg Xr24h-cap (Morphine sulfate) .... Two times a day 11)  Morphine Sulfate 15 Mg Tabs (Morphine sulfate) .... Two times a  day 12)  Align Caps (Probiotic product) .... Once daily 13)  Benefiber Powd (Wheat dextrin) .... Once daily 14)  Citalopram Hydrobromide 40 Mg Tabs (Citalopram hydrobromide) .... Take 1 tablet by mouth once a day  Other Orders: Gastroenterology Referral (GI) T-TSH [id number])  Patient Instructions: 1)  Please schedule a CPE appointment in 4 months. 2)  Dose inc of sitalopram to 40 mg daily and a referral to pschiatry. 3)  You are being referred to Dr Darrick Penna, also to a neurologist 4)  Labs are better, 5)  Low fat diet discussed and encouraged,  6)  Pt advised to reduce carbohydrate intake, espescially sweets, and to start regular physical activity, at least 30 minutes 5 days weekly, to enable weight loss, and reduce the risk of becoming diabetic  7)  HBA1C , fasting lipid , hepatic and HBA1C also TSH  fasting in January  Prescriptions: LOVASTATIN 40 MG TABS (LOVASTATIN) Take 1 tab by mouth at bedtime  #30 x 3   Entered by:   Everitt Amber LPN  Authorized by:   Syliva Overman MD   Signed by:   Everitt Amber LPN on 30/16/0109   Method used:   Electronically to        [facility] (retail)       [address]       [place], [place]  [address]       Ph: [phone]       Fax: [phone]   RxID:   [id number] BUPROPION HCL 300 MG XR24H-TAB (BUPROPION HCL) Take 1 capsule by mouth once a day  #30 x 3   Entered by:   Everitt Amber LPN   Authorized by:   Syliva Overman MD   Signed by:   Everitt Amber LPN on 48/54/6270   Method used:   Electronically to        [facility] (retail)       [address]       [place], [place]  [address]       Ph: [phone]       Fax: [phone]   RxID:   [id number] CITALOPRAM HYDROBROMIDE 40 MG TABS (CITALOPRAM HYDROBROMIDE) Take 1 tablet by mouth once a day  #30 x 4   Entered and Authorized by:   Syliva Overman MD   Signed by:   Syliva Overman MD on 04/21/2010    Method used:   Printed then faxed to ...       [facility] (retail)       [address]       [place], [place]  [address]       Ph: [phone]       Fax: [phone]   RxID:   [id number]

## 2010-09-09 NOTE — Assessment & Plan Note (Signed)
Summary: REFILLS ON MEDS/SS   Visit Type:  f/u Primary Care Provider:  Syliva Overman  Chief Complaint:  needs refills and having a little nausea.  History of Present Illness: Patient is here for f/u for refills and c/o some nausea when he eats out. He's been under a lot of stress with death of mother in Jul 22, 2023, died with cancer. He is going to bereavement counselor and has appt with psychiatrist tomorrow. He c/o nausea without vomiting when eating at certain restaurants. Happened yesterday after eating barbecue. C/O some heartburn. .  He has h/o abd bloating, pain, diarrhea fet to be secondary to functional gut d/o.  He completed antivial tx for HCV since he was seen last.  He says 2 mos off of tx his viral load is positive.  Dr. Marva Panda in [place] has been managing his care.  Overall he is feeling well.  He c/o early am cramps relieved with bms several times a week.  Denies melena or brbpr.  Denies hb, n/v.  No wt loss.  Bentyl seems to help some. Does not feel OTC PPI working, not sure if on Prilosec or Prevacid. Taking stool softner and metamucil to help with BMs, since got constipated after neck surgery. Takes ibuprofen 800mg  daily as needed for neck and back pain. Recent labs showed normal H/H, LFTs.  He is supposed to call [facility] when he is ready to be re-evaluated for h/o refractory HCV.         Current Medications (verified): 1)  Dicyclomine Hcl 10 Mg Caps (Dicyclomine Hcl) .... Take Two Capsules Po in Am, One At Lunch,  and 2 Capsules At Bedtime As Needed For Abdominal Pain. 2)  Lortab 5 5-500 Mg Tabs (Hydrocodone-Acetaminophen) .Marland Kitchen.. 1-2 Tabs Q 4-6 Hrs As Needed 3)  Ibuprofen 200 Mg Tabs (Ibuprofen) .... As Needed 4)  Tylenol 325 Mg Tabs (Acetaminophen) .... As Needed 5)  Prilosec Otc .... Take 1 Tablet By Mouth Once A Day 6)  Carisoprodol 350 Mg Tabs (Carisoprodol) .... Qid 7)  Klonopin 0.5 Mg Tabs (Clonazepam) .... Take 1 Tablet By Mouth Once A Day 8)  Wellbutrin 75 Mg Tabs  (Bupropion Hcl) .... Take 1 Tablet By Mouth Two Times A Day 9)  Lovastatin 40 Mg Tabs (Lovastatin) .... Take 1 Tab By Mouth At Bedtime 10)  Stool Softener 100 Mg Caps (Docusate Sodium) .... Once Daily 11)  Metamucil 30.9 % Powd (Psyllium) .... Once Daily  Allergies (verified): 1)  ! Levaquin  Past History:  Past medical, surgical, family and social histories (including risk factors) reviewed, and no changes noted (except as noted below).  Past Medical History: Reviewed history from 09/04/2009 and no changes required. Hepatitis C   dx in 1979,. treated with rivabarin and iterferon treated for 15 months, pt states in OCT 2009 he was dx with reactivated hep C , he should have been to [facility] for treatment but due to ill health of his parebnts got put off back problems gerd Depression IBS  Past Surgical History: Reviewed history from 09/04/2009 and no changes required. Appendectomy Tonsillectomy carpal tunnel release  not by Dr. Romeo Apple  Right ring finger surgery Sept 2, 2010- DDD Spinal stenosis, had screws put in neck  dr Gerlene Fee  Family History: Reviewed history from 09/04/2009 and no changes required. FH of Cancer:  Family History Coronary Heart Disease male < 73 Mother-deceased-rare bladder cancer  died Jul 21, 2009  age 28 Sister-Deceased-liver mets died at age 69 Sister-deceased-Stoke   at age 57 Father-deceased-Heart Failure  and TIA's died at 57  (2009)  Social History: Reviewed history from 09/04/2009 and no changes required. Patient is divorced.  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  Recrteational drug use quit in 1977  Review of Systems      See HPI  Vital Signs:  Patient profile:   55 year old male Height:      71 inches Weight:      159 pounds BMI:     22.26 Temp:     97.8 degrees F oral Pulse rate:   76 / minute BP sitting:   138 / 90  (left arm) Cuff size:    regular  Vitals Entered By: Hendricks Limes LPN (September 17, 2009 10:18 AM)  Physical Exam  General:  Well developed, well nourished, no acute distress. Head:  Normocephalic and atraumatic. Eyes:  Sclera nonicteric. Mouth:  OP moist.  Abdomen:  Bowel sounds normal.  Abdomen is soft, nontender, nondistended.  No rebound or guarding.  No hepatosplenomegaly, masses or hernias.  No abdominal bruits.  Extremities:  No clubbing, cyanosis, edema or deformities noted. Neurologic:  Alert and  oriented x4;  grossly normal neurologically. Skin:  Intact without significant lesions or rashes. Psych:  Alert and cooperative. Normal mood and affect.  Impression & Recommendations:  Problem # 1:  GERD (ICD-530.81) Some refractory symptoms on OTC PPI. Stop current PPI. Begin Nexium 40mg  by mouth daily for next four weeks. At that time we could continue Nexium or if more cost effective for patient, he could go back on OTC PPI, but if symptoms recur he would need to stay on Nexium.  No alarm symptoms at this time.  Problem # 2:  IRRITABLE BOWEL SYNDROME (ICD-564.1) Stable on dicyclomine. Add Restora one by mouth dailiy. #30. OV in 6 months with Dr. Darrick Penna.  Problem # 3:  HEPATITIS C, CHRONIC (ICD-070.54) Encouraged him to pursue w/u at [facility]. He needs routine screening for hepatoma twice per year at minimum. He agrees to call [facility]. He will let us know if we can be of further assistance. He primarily saw Dr. Barnetta Chapel, [place], for chronic HCV.  Problem # 4:  COLONIC POLYPS, ADENOMATOUS, HX OF (ICD-V12.72) Due for surveillance TCS in 11/2011.  Patient Instructions: 1)  Take Restora (probiotic) one by mouth daily for four weeks. 2)  Stop you OTC Prevacid or Prilosec. 3)  Begin Nexium 40 mg by mouth daily for next four weeks and then go back on OTC med.  4)  Please schedule a follow-up appointment in 6 months. 5)  The medication list was reviewed and reconciled.  All changed / newly prescribed  medications were explained.  A complete medication list was provided to the patient / caregiver.  Appended Document: Orders Update - charge    Clinical Lists Changes  Orders: Added new Service order of Est. Patient Level II (36644) - Signed

## 2010-09-09 NOTE — Assessment & Plan Note (Signed)
Summary: NEW PATIENT   Vital Signs:  Patient profile:   55 year old male Height:      71 inches Weight:      160.75 pounds BMI:     22.50 O2 Sat:      97 % Pulse rate:   87 / minute Pulse rhythm:   regular Resp:     16 per minute BP sitting:   120 / 78 Cuff size:   regular  Vitals Entered By: Everitt Amber (September 04, 2009 1:41 PM) CC: New Patient, headache, still recovering from neck surgery Is Patient Diabetic? No   Primary Care Provider:  [facility]  CC:  New Patient, headache, and still recovering from neck surgery.  History of Present Illness: Larey Seat on ice  about 2 weeks ago, seeing chiropracter, and physical therapy for this He notes light and sound sensitivity since fall, and increased  which are new at the base of the skullDoes not recall direct head trauma with fall, he was dazed, states his head jerked to the side.C/o soreness all over since the fall , , he is tahing relafen and hydrocodone from [facility]. hE HAS BEEN LOST TO F/U FOR HIS HEP c DISEASE BUT PLANS TO RESUME THIS. hE IS DEPRESSED, HE RECENTLY lost his only livingrelative his mother, is interstd in therapy.  Preventive Screening-Counseling & Management  Alcohol-Tobacco     Smoking Status: quit  Current Medications (verified): 1)  Dicyclomine Hcl 10 Mg Caps (Dicyclomine Hcl) .... Take Two Capsules Po in Am, One At Lunch,  and 2 Capsules At Bedtime As Needed For Abdominal Pain. 2)  Lortab 5 5-500 Mg Tabs (Hydrocodone-Acetaminophen) .Marland Kitchen.. 1-2 Tabs Q 4-6 Hrs As Needed 3)  Ibuprofen 200 Mg Tabs (Ibuprofen) .... As Needed 4)  Tylenol 325 Mg Tabs (Acetaminophen) .... As Needed 5)  Prilosec Otc .... Take 1 Tablet By Mouth Once A Day 6)  Carisoprodol 350 Mg Tabs (Carisoprodol) .... Take 1 Tablet By Mouth Two Times A Day 7)  Klonopin 0.5 Mg Tabs (Clonazepam) .... Take 1 Tablet By Mouth Once A Day  Allergies (verified): 1)  ! Levaquin  Past History:  Family History: Last updated: 09/04/2009 FH of Cancer:    Family History Coronary Heart Disease male < 35 Mother-deceased-rare bladder cancer  died 2009-07-21  age 31 Sister-Deceased-liver mets died at age 26 Sister-deceased-Stoke   at age 83 Father-deceased-Heart Failure and TIA's died at 24  01/19/08)  Social History: Last updated: 09/04/2009 Patient is divorced.  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  Recrteational drug use quit in 19-Jan-1976  Risk Factors: Caffeine Use: 1 (10/08/2008)  Risk Factors: Smoking Status: quit (09/04/2009)  Past Medical History: Hepatitis C   dx in 01/18/78,. treated with rivabarin and iterferon treated for 15 months, pt states in OCT 2009 he was dx with reactivated hep C , he should have been to [facility] treatment but due to ill health of his parebnts got put off back problems gerd Depression IBS  Past Surgical History: Appendectomy Tonsillectomy carpal tunnel release  not by Dr. Romeo Apple  Right ring finger surgery Sept 2, 2010- DDD Spinal stenosis, had screws put in neck  dr Gerlene Fee  Family History: FH of Cancer:  Family History Coronary Heart Disease male < 52 Mother-deceased-rare bladder cancer  died 2009-07-21  age 18 Sister-Deceased-liver mets died at age 61 Sister-deceased-Stoke   at age 23 Father-deceased-Heart  Failure and TIA's died at 72  Dec 31, 2007)  Social History: Patient is divorced.  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  Recrteational drug use quit in 12-31-75 Smoking Status:  quit  Review of Systems      See HPI General:  Complains of fatigue and malaise; denies chills and fever. Eyes:  Denies blurring and discharge. ENT:  Denies earache, nasal congestion, sinus pressure, and sore throat. CV:  Denies chest pain or discomfort, difficulty breathing while lying down, palpitations, and swelling of feet. Resp:   Denies cough and sputum productive. GI:  Denies abdominal pain, constipation, diarrhea, nausea, and vomiting. GU:  Denies dysuria and urinary frequency. MS:  Complains of joint pain, low back pain, mid back pain, and stiffness. Derm:  Denies itching, lesion(s), and rash. Psych:  Complains of anxiety, depression, easily angered, easily tearful, and irritability; denies sense of great danger, suicidal thoughts/plans, thoughts of violence, and unusual visions or sounds. Heme:  Denies bleeding. Allergy:  Denies hives or rash and sneezing.  Physical Exam  General:  Well-developed,well-nourished,in no acute distress; alert,appropriate and cooperative throughout examination HEENT: No facial asymmetry,  EOMI, No sinus tenderness, TM's Clear, oropharynx  pink and moist.   Chest: Clear to auscultation bilaterally.  CVS: S1, S2, No murmurs, No S3.   Abd: Soft, Nontender.  MS: decreased ROM spine, hips, shoulders and knees.  Ext: No edema.   CNS: CN 2-12 intact, power tone and sensation normal throughout.   Skin: Intact, no visible lesions or rashes.  Psych: Good eye contact, flat affect.  Memory intact, depressed appearing.    Impression & Recommendations:  Problem # 1:  HEADACHE (ICD-784.0) Assessment Deteriorated  His updated medication list for this problem includes:    Lortab 5 5-500 Mg Tabs (Hydrocodone-acetaminophen) .Marland Kitchen... 1-2 tabs q 4-6 hrs as needed    Ibuprofen 200 Mg Tabs (Ibuprofen) .Marland Kitchen... As needed    Tylenol 325 Mg Tabs (Acetaminophen) .Marland Kitchen... As needed  Orders: Neurology Referral (Neuro)  Problem # 2:  DEPRESSION (ICD-311) Assessment: Deteriorated  The following medications were removed from the medication list:    Celexa 40 Mg Tabs (Citalopram hydrobromide) ..... Once daily    Alprazolam 0.5 Mg Tabs (Alprazolam) .Marland Kitchen... Take 1 tablet by mouth two times a day His updated medication list for this problem includes:    Klonopin 0.5 Mg Tabs (Clonazepam) .Marland Kitchen... Take 1 tablet by  mouth once a day    Wellbutrin 75 Mg Tabs (Bupropion hcl) .Marland Kitchen... Take 1 tablet by mouth two times a day  Orders: Psychology Referral (Psychology)  Problem # 3:  HEPATITIS C, CHRONIC (ICD-070.54) Assessment: Comment Only pt to contact [facility] to resume treatment  Complete Medication List: 1)  Dicyclomine Hcl 10 Mg Caps (Dicyclomine hcl) .... Take two capsules po in am, one at lunch,  and 2 capsules at bedtime as needed for abdominal pain. 2)  Lortab 5 5-500 Mg Tabs (Hydrocodone-acetaminophen) .Marland Kitchen.. 1-2 tabs q 4-6 hrs as needed 3)  Ibuprofen 200 Mg Tabs (Ibuprofen) .... As needed 4)  Tylenol 325 Mg Tabs (Acetaminophen) .... As needed 5)  Prilosec Otc  .... Take 1 tablet by mouth once a day 6)  Carisoprodol 350 Mg Tabs (Carisoprodol) .... Take 1 tablet by mouth two times a day 7)  Klonopin 0.5 Mg Tabs (Clonazepam) .... Take 1 tablet by mouth once a day 8)  Wellbutrin 75 Mg Tabs (Bupropion hcl) .Marland KitchenMarland KitchenMarland Kitchen  Take 1 tablet by mouth two times a day 9)  Lovastatin 40 Mg Tabs (Lovastatin) .... Take 1 tab by mouth at bedtime  Other Orders: T-CBC w/Diff [id number]) T-Basic Metabolic Panel [id number]) T-Lipid Profile [id number]) T-PSA ([id number]) T-TSH [id number])  Patient Instructions: 1)  Please schedule a follow-up appointment in 2 months. 2)  You will be referred for therapy ansd also to the neuro,ogist for eval of new headaches. 3)  pLS call [facility] to resume treatment, if you run into problems let us know or contact the specialist in [place] who treated you before 4)  Fasting labs are past due Prescriptions: LOVASTATIN 40 MG TABS (LOVASTATIN) Take 1 tab by mouth at bedtime  #30 x 3   Entered and Authorized by:   Syliva Overman MD   Signed by:   Syliva Overman MD on 09/09/2009   Method used:   Electronically to        [facility] (retail)       [address]       [place], [place]  [address]       Ph: [phone]       Fax: [phone]    RxID:   [id number] WELLBUTRIN 75 MG TABS (BUPROPION HCL) Take 1 tablet by mouth two times a day  #60 x 3   Entered and Authorized by:   Syliva Overman MD   Signed by:   Syliva Overman MD on 09/04/2009   Method used:   Electronically to        [facility] (retail)       [address]       [place], [place]  [address]       Ph: [phone]       Fax: [phone]   RxID:   [id number]

## 2010-09-09 NOTE — Assessment & Plan Note (Signed)
Summary: F UP   Vital Signs:  Patient profile:   55 year old male Height:      71 inches Weight:      155.75 pounds BMI:     21.80 O2 Sat:      98 % Pulse rate:   72 / minute Pulse rhythm:   regular Resp:     16 per minute BP sitting:   130 / 88  (left arm) Cuff size:   regular  Vitals Entered By: Everitt Amber LPN (November 07, 2009 1:30 PM) CC: having some pain in his right shoulder, neck and his back    Primary Care Provider:  Syliva Overman  CC:  having some pain in his right shoulder and neck and his back .  History of Present Illness: pt states he has been having dentalissues was getting meds from his dentist as we3ll as from the nuerosurgeon, he is now in a bind for pain meds and will have to speak with the neurosurgeon about this . I have made this clear that i will not  prescribe pain meds.  he has poor sleep habits, sleeps for 2 hr periods from 12 h to 6. He denies any recent fever , chills , head or chest congestion. He denies urinary frequency or burning.   Current Medications (verified): 1)  Dicyclomine Hcl 10 Mg Caps (Dicyclomine Hcl) .... Take Two Capsules Po in Am, One At Lunch,  and 2 Capsules At Bedtime As Needed For Abdominal Pain. 2)  Lortab 5 5-500 Mg Tabs (Hydrocodone-Acetaminophen) .Marland Kitchen.. 1-2 Tabs Q 4-6 Hrs As Needed 3)  Ibuprofen 200 Mg Tabs (Ibuprofen) .... As Needed 4)  Tylenol 325 Mg Tabs (Acetaminophen) .... As Needed 5)  Prilosec Otc .... Take 1 Tablet By Mouth Once A Day 6)  Carisoprodol 350 Mg Tabs (Carisoprodol) .... Qid 7)  Klonopin 0.5 Mg Tabs (Clonazepam) .... Take 1 Tablet By Mouth Once A Day 8)  Wellbutrin 75 Mg Tabs (Bupropion Hcl) .... Take 1 Tablet By Mouth Two Times A Day 9)  Lovastatin 40 Mg Tabs (Lovastatin) .... Take 1 Tab By Mouth At Bedtime 10)  Stool Softener 100 Mg Caps (Docusate Sodium) .... Once Daily 11)  Metamucil 30.9 % Powd (Psyllium) .... Once Daily 12)  Celexa 20 Mg Tabs (Citalopram Hydrobromide) .... One Tab By Mouth Once  Daily  Allergies (verified): 1)  ! Levaquin  Review of Systems      See HPI General:  Complains of fatigue and sleep disorder. Eyes:  Denies blurring and discharge. ENT:  See HPI. CV:  Denies chest pain or discomfort, difficulty breathing while lying down, palpitations, and swelling of feet. Resp:  Denies cough and sputum productive. GI:  Complains of loss of appetite; denies abdominal pain, constipation, diarrhea, nausea, and vomiting. GU:  Denies erectile dysfunction, urinary frequency, and urinary hesitancy. MS:  Complains of joint pain, low back pain, mid back pain, and stiffness; c/o generalised pain in neck shoulders and back, there are also issues with pain management since he has been getting screipts from more than 1 provider. . Derm:  Denies itching and rash. Neuro:  Denies headaches, seizures, and sensation of room spinning. Psych:  Complains of anxiety, depression, and mental problems; denies sense of great danger, suicidal thoughts/plans, thoughts of violence, and unusual visions or sounds; currently dissatisfied withhis therapist, wants more time to verbalise his feelings. Endo:  Denies excessive hunger and excessive thirst. Heme:  Denies abnormal bruising and bleeding. Allergy:  Denies hives or rash  and sneezing.  Physical Exam  General:  Well-developed,well-nourished,in no acute distress; alert,appropriate and cooperative throughout examination HEENT: No facial asymmetry,  EOMI, No sinus tenderness, TM's Clear, oropharynx  pink and moist.   Chest: Clear to auscultation bilaterally.  CVS: S1, S2, No murmurs, No S3.   Abd: Soft, Nontender.  MS: Adequate ROM spine, hips, shoulders and knees.  Ext: No edema.   CNS: CN 2-12 intact, power tone and sensation normal throughout.   Skin: Intact, no visible lesions or rashes.  Psych: Good eye contact,flat affect.  Memory intact, not anxious but depressed appearing.     Impression & Recommendations:  Problem # 1:   INSOMNIA (ICD-780.52) Assessment Improved  His updated medication list for this problem includes:    Restoril 15 Mg Caps (Temazepam) .Marland Kitchen... Take 1 capsule by mouth at bedtime  Discussed sleep hygiene.   Problem # 2:  DEPRESSION (ICD-311) Assessment: Unchanged  The following medications were removed from the medication list:    Wellbutrin 75 Mg Tabs (Bupropion hcl) .Marland Kitchen... Take 1 tablet by mouth two times a dayreports that this seeems top cause some anxiety His updated medication list for this problem includes:    Klonopin 0.5 Mg Tabs (Clonazepam) .Marland Kitchen... Take 1 tablet by mouth once a day    Celexa 20 Mg Tabs (Citalopram hydrobromide) ..... One tab by mouth once daily    Bupropion Hcl 150 Mg Xr12h-tab (Bupropion hcl) .Marland Kitchen... Take 1 tablet by mouth once a day  Problem # 3:  GERD (ICD-530.81) Assessment: Improved  His updated medication list for this problem includes:    Dicyclomine Hcl 10 Mg Caps (Dicyclomine hcl) .Marland Kitchen... Take two capsules po in am, one at lunch,  and 2 capsules at bedtime as needed for abdominal pain.  Complete Medication List: 1)  Dicyclomine Hcl 10 Mg Caps (Dicyclomine hcl) .... Take two capsules po in am, one at lunch,  and 2 capsules at bedtime as needed for abdominal pain. 2)  Lortab 5 5-500 Mg Tabs (Hydrocodone-acetaminophen) .Marland Kitchen.. 1-2 tabs q 4-6 hrs as needed 3)  Ibuprofen 200 Mg Tabs (Ibuprofen) .... As needed 4)  Tylenol 325 Mg Tabs (Acetaminophen) .... As needed 5)  Prilosec Otc  .... Take 1 tablet by mouth once a day 6)  Carisoprodol 350 Mg Tabs (Carisoprodol) .... Qid 7)  Klonopin 0.5 Mg Tabs (Clonazepam) .... Take 1 tablet by mouth once a day 8)  Lovastatin 40 Mg Tabs (Lovastatin) .... Take 1 tab by mouth at bedtime 9)  Stool Softener 100 Mg Caps (Docusate sodium) .... Once daily 10)  Metamucil 30.9 % Powd (Psyllium) .... Once daily 11)  Celexa 20 Mg Tabs (Citalopram hydrobromide) .... One tab by mouth once daily 12)  Bupropion Hcl 150 Mg Xr12h-tab (Bupropion  hcl) .... Take 1 tablet by mouth once a day 13)  Restoril 15 Mg Caps (Temazepam) .... Take 1 capsule by mouth at bedtime  Other Orders: T-Hepatic Function [id number]) T-Lipid Profile [id number]) T- Hemoglobin A1C ([id number])  Patient Instructions: 1)  f/u in 6 to 8 weeks. 2)  Hepatic Panel prior to visit, ICD-9: 3)  Lipid Panel prior to visit, ICD-9: 4)  fasting blood sugar and hBA1c   in 6 to 8 weeks 5)  you will start a long acting form of wellbutrin and I have started a new med for sleep Prescriptions: RESTORIL 15 MG CAPS (TEMAZEPAM) Take 1 capsule by mouth at bedtime  #30 x 0   Entered and Authorized by:   Syliva Overman MD  Signed by:   Syliva Overman MD on 11/07/2009   Method used:   Printed then faxed to ...       [facility] (retail)       [address]       [place], [place]  [address]       Ph: [phone]       Fax: [phone]   RxID:   [id number] BUPROPION HCL 150 MG XR12H-TAB (BUPROPION HCL) Take 1 tablet by mouth once a day  #30 x 3   Entered and Authorized by:   Syliva Overman MD   Signed by:   Syliva Overman MD on 11/07/2009   Method used:   Printed then faxed to ...       [facility] (retail)       [address]       [place], [place]  [address]       Ph: [phone]       Fax: [phone]   RxID:   [id number]

## 2010-09-09 NOTE — Progress Notes (Signed)
Summary: CALL  Phone Note Call from Patient   Summary of Call: WANTS TO KNOW ABOUT A CANCER SCREENING TEST   CALL BACK AT [phone] Initial call taken by: Lind Guest,  April 04, 2010 10:06 AM  Follow-up for Phone Call        what test Follow-up by: Syliva Overman MD,  April 04, 2010 12:19 PM  Additional Follow-up for Phone Call Additional follow up Details #1::        CALLED PATIENT, LEFT MESSAGE Additional Follow-up by: Adella Hare LPN,  April 04, 2010 3:31 PM    Additional Follow-up for Phone Call Additional follow up Details #2::    PATIENT STATES CANCER HAS RUN IN HIS FAMILY, SEVERAL DIRECT FAMILY MEMBERS HAVE HAD CANCER AND WANTS TO KNOW IF THERE IS ANY BLOODWORK THAT CAN DETECT CANCER? Follow-up by: Adella Hare LPN,  April 04, 2010 3:35 PM  Additional Follow-up for Phone Call Additional follow up Details #3:: Details for Additional Follow-up Action Taken: the only blood test for ca in males is pSA, forsomeone who has colon ca there is a test done to follow the disease Additional Follow-up by: Syliva Overman MD,  April 05, 2010 9:52 AM  patient aware Adella Hare LPN  April 07, 2010 9:46 AM

## 2010-09-09 NOTE — Miscellaneous (Signed)
Summary: neuro appt  appt at [facility] Jun 04 2010 @ 9:30am they have mailed appt and info packet to patient

## 2010-09-09 NOTE — Miscellaneous (Signed)
Summary: Orders Update  Clinical Lists Changes  Orders: Added new Test order of T-Hepatic Function ([id number]) - Signed 

## 2010-09-09 NOTE — Medication Information (Signed)
Summary: Tax adviser   Imported By: Diana Eves 09/02/2009 10:43:28  _____________________________________________________________________  External Attachment:    Type:   Image     Comment:   External Document  Appended Document: RX Folder DICYCLOMINE    Prescriptions: DICYCLOMINE HCL 10 MG CAPS (DICYCLOMINE HCL) Take two capsules PO in am, one at lunch,  and 2 capsules at bedtime as needed for abdominal pain.  #150 x 1   Entered and Authorized by:   Joselyn Arrow FNP-BC   Signed by:   Joselyn Arrow FNP-BC on 09/02/2009   Method used:   Electronically to        [facility] (retail)       [address]       [place], [place]  [address]       Ph: [phone]       Fax: [phone]   RxID:   [id number]

## 2010-09-11 ENCOUNTER — Encounter (INDEPENDENT_AMBULATORY_CARE_PROVIDER_SITE_OTHER): Payer: PRIVATE HEALTH INSURANCE | Admitting: Psychiatry

## 2010-09-11 DIAGNOSIS — F331 Major depressive disorder, recurrent, moderate: Secondary | ICD-10-CM

## 2010-09-11 NOTE — Progress Notes (Signed)
Summary: headache  Phone Note Call from Patient   Summary of Call: blood pressure meds not working. just laying n bed. haedache will not go away. [phone] Initial call taken by: Rudene Anda,  August 27, 2010 11:41 AM  Follow-up for Phone Call        [pls advise ed eval for uncontrolled headache and generally feeling ill Follow-up by: Syliva Overman MD,  August 27, 2010 4:50 PM  Additional Follow-up for Phone Call Additional follow up Details #1::        patient aware Additional Follow-up by: Adella Hare LPN,  August 27, 2010 4:57 PM

## 2010-09-11 NOTE — Assessment & Plan Note (Signed)
Summary: office visit   Vital Signs:  Patient profile:   55 year old male Height:      71 inches Weight:      157.25 pounds BMI:     22.01 O2 Sat:      98 % Pulse rate:   117 / minute Pulse rhythm:   regular Resp:     16 per minute BP sitting:   160 / 102  (left arm) Cuff size:   regular  Vitals Entered By: Everitt Amber LPN (August 25, 2010 11:29 AM) CC: Follow up chronic problems    Primary Care Provider:  Lodema Hong, M.D.  CC:  Follow up chronic problems .  History of Present Illness: Feels stressed, has recently had a change in pain management, and this is stressing him out.Statesits doing better overall He states his friends who are effectively his family, are not doing well, and this bothers him. He feels alone alot of the time.  Has appt with psych tomorrow , who he has not seen for a while,  Reports increase clear drainage, no recent fever or chills  Denies recent fever or chills. Denies sinus pressure,  ear pain or sore throat. Denies chest congestion, or cough productive of sputum. Denies chest pain, palpitations, PND, orthopnea or leg swelling. Denies abdominal pain, nausea, vomitting, diarrhea or constipation. Denies change in bowel movements or bloody stool. Denies dysuria , frequency, incontinence or hesitancy. Denies  joint pain, swelling, or reduced mobility. Denies headaches, vertigo, seizures.  Denies  rash, lesions, or itch.     Current Medications (verified): 1)  Dicyclomine Hcl 10 Mg Caps (Dicyclomine Hcl) .Marland Kitchen.. 1 By Mouth Ac/hs (Up To Qid) As Needed Diarrhea or Abd Pain 2)  Ibuprofen 200 Mg Tabs (Ibuprofen) .... As Needed 3)  Tylenol 325 Mg Tabs (Acetaminophen) .... As Needed 4)  Prilosec Otc .... Take 1 Tablet By Mouth Once A Day 5)  Carisoprodol 350 Mg Tabs (Carisoprodol) .... Qid 6)  Klonopin 0.5 Mg Tabs (Clonazepam) .... Take 1 Tablet By Mouth Once A Day 7)  Lovastatin 40 Mg Tabs (Lovastatin) .... Take 1 Tab By Mouth At Bedtime 8)  Stool  Softener 100 Mg Caps (Docusate Sodium) .... Once Daily 9)  Bupropion Hcl 300 Mg Xr24h-Tab (Bupropion Hcl) .... Take 1 Capsule By Mouth Once A Day 10)  Align  Caps (Probiotic Product) .... Once Daily 11)  Benefiber  Powd (Wheat Dextrin) .... Once Daily 12)  Citalopram Hydrobromide 40 Mg Tabs (Citalopram Hydrobromide) .... Take 1 Tablet By Mouth Once A Day 13)  Promethazine Hcl 12.5 Mg Tabs (Promethazine Hcl) .... Take 1 Tablet By Mouth Two Times A Day As Needed For Nausea 14)  Oxycodone-Acetaminophen 7.5-325 Mg Tabs (Oxycodone-Acetaminophen) .... Take 1/2-1 Tab Two Times A Day 15)  Butrans 20 Mcg/hr Ptwk (Buprenorphine) .... Apply One Patch Weekly  Allergies (verified): 1)  ! Levaquin  Review of Systems      See HPI General:  Complains of fatigue. Eyes:  Denies discharge and red eye. Psych:  Complains of anxiety, depression, and mental problems; denies suicidal thoughts/plans, thoughts of violence, and unusual visions or sounds. Endo:  Denies cold intolerance, excessive hunger, excessive thirst, and excessive urination. Heme:  Denies abnormal bruising, bleeding, enlarge lymph nodes, and fevers. Allergy:  Complains of seasonal allergies.  Physical Exam  General:  Well-developed,well-nourished,in no acute distress; alert,appropriate and cooperative throughout examination HEENT: No facial asymmetry,  EOMI, No sinus tenderness, TM's Clear, oropharynx  pink and moist.   Chest: Clear to  auscultation bilaterally.  CVS: S1, S2, No murmurs, No S3.   Abd: Soft, Nontender.  MS: Adequate ROM spine, hips, shoulders and knees.  Ext: No edema.   CNS: CN 2-12 intact, power tone and sensation normal throughout.   Skin: Intact, no visible lesions or rashes.  Psych: Good eye contact, flat affect.  Memory intact, not anxious or depressed appearing.    Impression & Recommendations:  Problem # 1:  IMPAIRED FASTING GLUCOSE (ICD-790.21) Assessment Comment Only  Orders: T- Hemoglobin A1C  ([id number])  Labs Reviewed: Creat: 1.15 (04/10/2010)    Pt advised to reduce carbohydrate intake, espescially sweets, and to start regular physical activity, at least 30 minutes 5 days weekly, to enable weight loss, and reduce the risk of becoming diabetic   Problem # 2:  HYPERLIPIDEMIA (ICD-272.4) Assessment: Comment Only  His updated medication list for this problem includes:    Lovastatin 40 Mg Tabs (Lovastatin) .Marland Kitchen... Take 1 tab by mouth at bedtime Low fat dietdiscussed and encouraged  Orders: T-Lipid Profile ([id number]) T-Hepatic Function [phone])  Labs Reviewed: SGOT: 22 (04/10/2010)   SGPT: 37 (04/10/2010)   HDL:57 (04/10/2010), 47 (09/05/2009)  LDL:108 (04/10/2010), 158 (09/05/2009)  Chol:194 (04/10/2010), 224 (09/05/2009)  Trig:146 (04/10/2010), 97 (09/05/2009)  Problem # 3:  DEPRESSION (ICD-311) Assessment: Unchanged  His updated medication list for this problem includes:    Klonopin 0.5 Mg Tabs (Clonazepam) .Marland Kitchen... Take 1 tablet by mouth once a day    Bupropion Hcl 300 Mg Xr24h-tab (Bupropion hcl) .Marland Kitchen... Take 1 capsule by mouth once a day    Citalopram Hydrobromide 40 Mg Tabs (Citalopram hydrobromide) .Marland Kitchen... Take 1 tablet by mouth once a day  Problem # 4:  UNSPECIFIED ESSENTIAL HYPERTENSION (ICD-401.9) Assessment: New  His updated medication list for this problem includes:    Amlodipine Besylate 5 Mg Tabs (Amlodipine besylate) .Marland Kitchen... Take 1 tablet by mouth once a day Patient advised to follow low sodium diet rich in fruit and vegetables, and to commit to at least 30 minutes 5 days per week of regular exercise , to improve blood presure control.   BP today: 160/102 Prior BP: 112/82 (04/21/2010)  Labs Reviewed: K+: 5.0 (04/10/2010) Creat: : 1.15 (04/10/2010)   Chol: 194 (04/10/2010)   HDL: 57 (04/10/2010)   LDL: 108 (04/10/2010)   TG: 146 (04/10/2010)  Complete Medication List: 1)  Dicyclomine Hcl 10 Mg Caps (Dicyclomine hcl) .Marland Kitchen.. 1 by mouth ac/hs (up to qid)  as needed diarrhea or abd pain 2)  Ibuprofen 200 Mg Tabs (Ibuprofen) .... As needed 3)  Tylenol 325 Mg Tabs (Acetaminophen) .... As needed 4)  Prilosec Otc  .... Take 1 tablet by mouth once a day 5)  Carisoprodol 350 Mg Tabs (Carisoprodol) .... Qid 6)  Klonopin 0.5 Mg Tabs (Clonazepam) .... Take 1 tablet by mouth once a day 7)  Lovastatin 40 Mg Tabs (Lovastatin) .... Take 1 tab by mouth at bedtime 8)  Stool Softener 100 Mg Caps (Docusate sodium) .... Once daily 9)  Bupropion Hcl 300 Mg Xr24h-tab (Bupropion hcl) .... Take 1 capsule by mouth once a day 10)  Align Caps (Probiotic product) .... Once daily 11)  Benefiber Powd (Wheat dextrin) .... Once daily 12)  Citalopram Hydrobromide 40 Mg Tabs (Citalopram hydrobromide) .... Take 1 tablet by mouth once a day 13)  Promethazine Hcl 12.5 Mg Tabs (Promethazine hcl) .... Take 1 tablet by mouth two times a day as needed for nausea 14)  Oxycodone-acetaminophen 7.5-325 Mg Tabs (Oxycodone-acetaminophen) .... Take 1/2-1 tab two times a  day 15)  Butrans 20 Mcg/hr Ptwk (Buprenorphine) .... Apply one patch weekly 16)  Amlodipine Besylate 5 Mg Tabs (Amlodipine besylate) .... Take 1 tablet by mouth once a day  Other Orders: T-Basic Metabolic Panel [id number])  Patient Instructions: 1)  Please schedule a follow-up appointment in 5 weeks. 2)  BMP prior to visit, ICD-9: 3)  Hepatic Panel prior to visit, ICD-9: 4)  Lipid Panel prior to visit, ICD-9:  fasting asap 5)  HbgA1C prior to visit, ICD-9: 6)  Bp med to be started, and is faxed in , your pressure is high Prescriptions: LOVASTATIN 40 MG TABS (LOVASTATIN) Take 1 tab by mouth at bedtime  #30 x 3   Entered by:   Everitt Amber LPN   Authorized by:   Syliva Overman MD   Signed by:   Everitt Amber LPN on 72/53/6644   Method used:   Electronically to        [facility] (retail)       [address]       [place], [place]  [address]       Ph: [phone]       Fax:  [phone]   RxID:   [id number] AMLODIPINE BESYLATE 5 MG TABS (AMLODIPINE BESYLATE) Take 1 tablet by mouth once a day  #30 x 2   Entered and Authorized by:   Syliva Overman MD   Signed by:   Syliva Overman MD on 08/25/2010   Method used:   Electronically to        [facility] (retail)       [address]       [place], [place]  [address]       Ph: [phone]       Fax: [phone]   RxID:   [id number]    Orders Added: 1)  Est. Patient Level IV [46270] 2)  T-Basic Metabolic Panel [[id number]] 3)  T-Lipid Profile [[id number]] 4)  T-Hepatic Function [[id number]] 5)  T- Hemoglobin A1C [83036-23375]

## 2010-09-11 NOTE — Progress Notes (Signed)
Summary: rx request for bentyl  Phone Note Call from Patient Call back at Home Phone [phone]   Caller: Patient Summary of Call: pt called- left voicemail- pt wants to know if he can restart Bentyl. He stated he is having some of the same symptoms he was having before when he started taking it. Pt wants to know if we can call it in to [facility] or does he need an ov. please advise Initial call taken by: Hendricks Limes LPN,  August 05, 2010 9:42 AM     Appended Document: rx request for bentyl    Prescriptions: DICYCLOMINE HCL 10 MG CAPS (DICYCLOMINE HCL) 1 by mouth ac/hs (up to QID) as needed diarrhea or abd pain  #120 x 1   Entered and Authorized by:   Joselyn Arrow FNP-BC   Signed by:   Joselyn Arrow FNP-BC on 08/05/2010   Method used:   Electronically to        [facility] (retail)       [address]       [place], [place]  [address]       Ph: [phone]       Fax: [phone]   RxID:   [id number]     Appended Document: rx request for bentyl Please let pt know rx at [facility], FU as planned 2/12  Appended Document: rx request for bentyl Left the above message on VM for pt.

## 2010-09-18 ENCOUNTER — Encounter (INDEPENDENT_AMBULATORY_CARE_PROVIDER_SITE_OTHER): Payer: PRIVATE HEALTH INSURANCE | Admitting: Psychiatry

## 2010-09-18 DIAGNOSIS — F331 Major depressive disorder, recurrent, moderate: Secondary | ICD-10-CM

## 2010-09-23 ENCOUNTER — Encounter (INDEPENDENT_AMBULATORY_CARE_PROVIDER_SITE_OTHER): Payer: PRIVATE HEALTH INSURANCE | Admitting: Psychiatry

## 2010-09-23 DIAGNOSIS — F331 Major depressive disorder, recurrent, moderate: Secondary | ICD-10-CM

## 2010-09-25 ENCOUNTER — Encounter (HOSPITAL_COMMUNITY): Payer: PRIVATE HEALTH INSURANCE | Admitting: Psychiatry

## 2010-10-09 ENCOUNTER — Encounter: Payer: Self-pay | Admitting: Gastroenterology

## 2010-10-09 ENCOUNTER — Encounter: Payer: Self-pay | Admitting: Family Medicine

## 2010-10-12 ENCOUNTER — Emergency Department (HOSPITAL_COMMUNITY)
Admission: EM | Admit: 2010-10-12 | Discharge: 2010-10-12 | Disposition: A | Discharge status: Home / Self Care | Payer: PRIVATE HEALTH INSURANCE | Attending: Emergency Medicine | Admitting: Emergency Medicine

## 2010-10-12 DIAGNOSIS — R1310 Dysphagia, unspecified: Secondary | ICD-10-CM | POA: Insufficient documentation

## 2010-10-12 DIAGNOSIS — F3289 Other specified depressive episodes: Secondary | ICD-10-CM | POA: Insufficient documentation

## 2010-10-12 DIAGNOSIS — R059 Cough, unspecified: Secondary | ICD-10-CM | POA: Insufficient documentation

## 2010-10-12 DIAGNOSIS — G8929 Other chronic pain: Secondary | ICD-10-CM | POA: Insufficient documentation

## 2010-10-12 DIAGNOSIS — R599 Enlarged lymph nodes, unspecified: Secondary | ICD-10-CM | POA: Insufficient documentation

## 2010-10-12 DIAGNOSIS — M549 Dorsalgia, unspecified: Secondary | ICD-10-CM | POA: Insufficient documentation

## 2010-10-12 DIAGNOSIS — F329 Major depressive disorder, single episode, unspecified: Secondary | ICD-10-CM | POA: Insufficient documentation

## 2010-10-12 DIAGNOSIS — H9209 Otalgia, unspecified ear: Secondary | ICD-10-CM | POA: Insufficient documentation

## 2010-10-12 DIAGNOSIS — R0982 Postnasal drip: Secondary | ICD-10-CM | POA: Insufficient documentation

## 2010-10-12 DIAGNOSIS — I10 Essential (primary) hypertension: Secondary | ICD-10-CM | POA: Insufficient documentation

## 2010-10-12 DIAGNOSIS — R070 Pain in throat: Secondary | ICD-10-CM | POA: Insufficient documentation

## 2010-10-12 DIAGNOSIS — R05 Cough: Secondary | ICD-10-CM | POA: Insufficient documentation

## 2010-10-12 DIAGNOSIS — J069 Acute upper respiratory infection, unspecified: Secondary | ICD-10-CM | POA: Insufficient documentation

## 2010-10-12 DIAGNOSIS — Z79899 Other long term (current) drug therapy: Secondary | ICD-10-CM | POA: Insufficient documentation

## 2010-10-12 DIAGNOSIS — J3489 Other specified disorders of nose and nasal sinuses: Secondary | ICD-10-CM | POA: Insufficient documentation

## 2010-10-14 ENCOUNTER — Ambulatory Visit: Payer: PRIVATE HEALTH INSURANCE | Admitting: Urgent Care

## 2010-10-14 ENCOUNTER — Ambulatory Visit: Payer: PRIVATE HEALTH INSURANCE | Admitting: Gastroenterology

## 2010-10-27 ENCOUNTER — Encounter: Payer: Self-pay | Admitting: Gastroenterology

## 2010-11-03 ENCOUNTER — Ambulatory Visit (INDEPENDENT_AMBULATORY_CARE_PROVIDER_SITE_OTHER): Payer: PRIVATE HEALTH INSURANCE | Admitting: Gastroenterology

## 2010-11-03 ENCOUNTER — Encounter: Payer: Self-pay | Admitting: Gastroenterology

## 2010-11-03 VITALS — BP 138/78 | HR 84 | Temp 98.8°F | Ht 70.0 in | Wt 160.0 lb

## 2010-11-03 DIAGNOSIS — B182 Chronic viral hepatitis C: Secondary | ICD-10-CM

## 2010-11-03 NOTE — Progress Notes (Signed)
Subjective:    Patient ID: Marc [name], male    DOB: [birth year], 55 y.o.   MRN: [id number]  HPI Pt presents today in f/u for chronic Hep C, GERD, IBS. Taking Align, Miralax, supplemental fiber for IBS. Drinking lots of water. Has BM about 4 times first thing in the morning. No melena or brbpr. C/o lower abdominal cramping prior to BM usually, sometimes unassociated with BM. +gas. Occasional GERD, takes omeprazole daily. No dysphagia or odynophagia. Originally diagnosed with Hep C in 1979, treated with ribavirin and interferon X 15 mos, Hep C reactivated in 2009. Had been followed by Dr. Marva Panda in [place]. Was supposed to f/u with [facility]; due to mother's passing was unable to make appt. Requesting appt now to [facility] for evaluation and treatment.  Denies fever, chills, jaundice, pruritis, weight loss, lack of appetite.   Past Medical History  Diagnosis Date  . Hepatitis C 1979  . Hepatitis C reactive 05/2008  . Back problem   . GERD (gastroesophageal reflux disease)   . Depression   . IBS (irritable bowel syndrome)   . HTN (hypertension)   . Hypercholesterolemia   . Hx of adenomatous colonic polyps 2008    due for surveillance 2013    Past Surgical History  Procedure Date  . Appendectomy   . Tonsillectomy   . Carpal tunnel release   . Right ring finger   . Spinal stenosis, had screws put in neck 04/11/2009    DR KRITZER    Family History  Problem Relation Age of Onset  . Heart defect      FAMILY HX  . Cancer Mother     BLADDER  . Cancer Sister     METS  . Stroke Sister   . Heart failure Father   . Stroke Father     History  Substance Use Topics  . Smoking status: Never Smoker   . Smokeless tobacco: Never Used  . Alcohol Use: No    Allergies  Allergen Reactions  . Levofloxacin     REACTION: UNKNOWN REACTION    Current Outpatient Prescriptions  Medication Sig Dispense Refill  . acetaminophen (TYLENOL) 325 MG tablet Take 650 mg by mouth as needed.        Marland Kitchen  amLODipine (NORVASC) 5 MG tablet Take 5 mg by mouth daily.        . ARIPiprazole (ABILIFY) 2 MG tablet Take 3 mg by mouth daily.        . carisoprodol (SOMA) 350 MG tablet Take 350 mg by mouth 4 (four) times daily.        . clonazePAM (KLONOPIN) 0.5 MG tablet Take 0.5 mg by mouth daily as needed.        . dicyclomine (BENTYL) 10 MG capsule Take 10 mg by mouth. TAKE ONE BY MOUTH BEFORE BREAKFAST AND AT BEDTIME UP TO FOUR TIMES A DAY AS NEEDED FOR DIARRHEA AND ABD PAIN       . DULoxetine (CYMBALTA) 30 MG capsule Take 30 mg by mouth daily.        Marland Kitchen ibuprofen (ADVIL,MOTRIN) 200 MG tablet Take 200 mg by mouth as needed.        . lovastatin (MEVACOR) 40 MG tablet Take 40 mg by mouth at bedtime.        Marland Kitchen omeprazole (PRILOSEC OTC) 20 MG tablet Take 20 mg by mouth daily.        Marland Kitchen oxyCODONE-acetaminophen (PERCOCET) 7.5-325 MG per tablet Take 1 tablet by mouth every 8 (eight)  hours as needed. TAKE HALF OR ONE TAB TWO TIMES A DAY      . Probiotic Product (ALIGN PO) Take by mouth daily.        . Wheat Dextrin (BENEFIBER DRINK MIX PO) Take by mouth daily.        Jiles Garter Dextrin-Calcium (BENEFIBER PLUS CALCIUM) CHEW Chew 2 tablets by mouth daily.        . Buprenorphine (BUTRANS) 20 MCG/HR PTWK Place onto the skin once a week.        Marland Kitchen buPROPion (WELLBUTRIN XL) 300 MG 24 hr tablet Take 300 mg by mouth daily.        . citalopram (CELEXA) 40 MG tablet Take 40 mg by mouth daily.       Marland Kitchen docusate sodium (COLACE) 100 MG capsule Take 100 mg by mouth. Take once daily       . promethazine (PHENERGAN) 12.5 MG tablet Take 12.5 mg by mouth 2 (two) times daily as needed.          BP 138/78  Pulse 84  Temp(Src) 98.8 F (37.1 C) (Oral)  Ht 5\' 10"  (1.778 m)  Wt 160 lb (72.576 kg)  BMI 22.96 kg/m2    Review of Systems  Constitutional: Negative.   HENT: Negative.   Eyes: Negative.   Respiratory: Negative.   Cardiovascular: Negative.   Gastrointestinal:       See HPI  Genitourinary: Negative.   Musculoskeletal:  Negative.   Skin: Negative.   Neurological: Negative.   Hematological: Negative.   Psychiatric/Behavioral: Negative.        Objective:   Physical Exam  Constitutional: He is oriented to person, place, and time. He appears well-developed and well-nourished.  HENT:  Head: Normocephalic and atraumatic.  Eyes: Conjunctivae are normal. No scleral icterus.  Neck: Normal range of motion.  Cardiovascular: Normal rate, regular rhythm and normal heart sounds.   Pulmonary/Chest: Effort normal and breath sounds normal.  Abdominal: Soft. He exhibits no distension and no mass. There is no tenderness. There is no rebound and no guarding.       No HSM  Musculoskeletal: Normal range of motion.  Neurological: He is alert and oriented to person, place, and time.  Skin: Skin is warm and dry.  Psychiatric: He has a normal mood and affect. His behavior is normal.          Assessment & Plan:  55 year old Caucasian male with hx of chronic Hep C, GERD, IBS. Stable from GERD and IBS standpoint. Due for updated colonoscopy in 2013. Needs referral to [facility] for possible treatment of chronic Hep C.   HFP today Obtain records from [facility], Dr. Marva Panda Referral to [facility] F/U in  6 mos

## 2010-11-03 NOTE — Patient Instructions (Signed)
Return in 6 mos Miralax as needed Fiber everyday Probiotic daily Complete labs; we will call you with results

## 2010-11-04 ENCOUNTER — Encounter: Payer: Self-pay | Admitting: Gastroenterology

## 2010-11-04 ENCOUNTER — Encounter (HOSPITAL_COMMUNITY): Payer: PRIVATE HEALTH INSURANCE | Admitting: Psychiatry

## 2010-11-04 ENCOUNTER — Telehealth: Payer: Self-pay | Admitting: Gastroenterology

## 2010-11-04 NOTE — Telephone Encounter (Signed)
rx called to [facility] pharmacy

## 2010-11-04 NOTE — Telephone Encounter (Signed)
May have short course of Zofran. Has had in past. Please call into [facility].  Zofran 4 mg po every 6-8 hours prn nausea. Disp#30 with no refills.

## 2010-11-07 ENCOUNTER — Encounter (HOSPITAL_COMMUNITY): Payer: PRIVATE HEALTH INSURANCE | Admitting: Psychiatry

## 2010-11-15 LAB — BASIC METABOLIC PANEL
CO2: 26 mEq/L (ref 19–32)
Calcium: 9.7 mg/dL (ref 8.4–10.5)
GFR calc Af Amer: >60 mL/min (ref >60)
GFR calc non Af Amer: >60 mL/min (ref >60)
Potassium: 5.2 mEq/L — ABNORMAL HIGH (ref 3.5–5.1)
Sodium: 138 mEq/L (ref 135–145)

## 2010-11-15 LAB — HEPATIC FUNCTION PANEL
Albumin: 4.4 g/dL (ref 3.5–5.2)
Bilirubin, Direct: <0.1 mg/dL (ref 0.0–0.3)
Total Bilirubin: 0.6 mg/dL (ref 0.3–1.2)

## 2010-11-15 LAB — CBC
Hemoglobin: 14.3 g/dL (ref 13.0–17.0)
MCHC: 34.3 g/dL (ref 30.0–36.0)
MCV: 96.3 fL (ref 78.0–100.0)
RDW: 13.5 % (ref 11.5–15.5)

## 2010-11-23 ENCOUNTER — Emergency Department (HOSPITAL_COMMUNITY)
Admission: EM | Admit: 2010-11-23 | Discharge: 2010-11-23 | Disposition: A | Discharge status: Home / Self Care | Payer: PRIVATE HEALTH INSURANCE | Attending: Emergency Medicine | Admitting: Emergency Medicine

## 2010-11-23 ENCOUNTER — Emergency Department (HOSPITAL_COMMUNITY): Payer: PRIVATE HEALTH INSURANCE

## 2010-11-23 DIAGNOSIS — Y92009 Unspecified place in unspecified non-institutional (private) residence as the place of occurrence of the external cause: Secondary | ICD-10-CM | POA: Insufficient documentation

## 2010-11-23 DIAGNOSIS — S40029A Contusion of unspecified upper arm, initial encounter: Secondary | ICD-10-CM | POA: Insufficient documentation

## 2010-11-23 DIAGNOSIS — W010XXA Fall on same level from slipping, tripping and stumbling without subsequent striking against object, initial encounter: Secondary | ICD-10-CM | POA: Insufficient documentation

## 2010-11-23 DIAGNOSIS — R071 Chest pain on breathing: Secondary | ICD-10-CM | POA: Insufficient documentation

## 2010-11-23 DIAGNOSIS — S20219A Contusion of unspecified front wall of thorax, initial encounter: Secondary | ICD-10-CM | POA: Insufficient documentation

## 2010-12-19 ENCOUNTER — Encounter (INDEPENDENT_AMBULATORY_CARE_PROVIDER_SITE_OTHER): Payer: PRIVATE HEALTH INSURANCE | Admitting: Psychiatry

## 2010-12-19 DIAGNOSIS — F331 Major depressive disorder, recurrent, moderate: Secondary | ICD-10-CM

## 2010-12-23 ENCOUNTER — Encounter (HOSPITAL_COMMUNITY): Payer: PRIVATE HEALTH INSURANCE | Admitting: Psychiatry

## 2010-12-23 ENCOUNTER — Encounter (INDEPENDENT_AMBULATORY_CARE_PROVIDER_SITE_OTHER): Payer: PRIVATE HEALTH INSURANCE | Admitting: Psychiatry

## 2010-12-23 DIAGNOSIS — F411 Generalized anxiety disorder: Secondary | ICD-10-CM

## 2010-12-23 NOTE — Assessment & Plan Note (Signed)
NAME:  ROLLINS, WRIGHTSON                 CHART#:  [id number]   DATE:  01/10/2007                       DOB:  [birth year]   CHIEF COMPLAINT:  Followup EGD and colonoscopy.   SUBJECTIVE:  Mr. Nazar is a 55 year old male who underwent EGD and  colonoscopy by Dr. Cira Servant on 12/08/2006 with history of chronic abdominal  pain, weight loss and constipation.  He was found to have a 3 mm sigmoid  colon polyp and a 5 mm transverse colon polyp which were both removed  via cold forceps.  Biopsies consistent with adenomatous and hyperplastic  polyp.  There was no high grade dysplasia or invasive malignancy.  He  also had a normal EGD at the same time.  He was recently diagnosed with  hepatitis-C, is being followed by the [facility] in [place] prior to  coming to see [name].  He tells me he takes a class on June 10th and will  start his treatment later this month by Dr. Jacqualine Mau.  His right upper  quadrant pain seems to be much better since his colonoscopy.  He  occasionally still has intermittent pain.  He continues to have some  constipation and can go a couple of days without a bowel movement.  At  times he has lower abdominal pain and bloating.  His pain was worse when  he was on a Z-Pak as well as doxycycline recently for a presumed insect  bite, possible tick bite, and he feels better now that he is off these 2  medications.   MEDICATIONS:  See the list from 01/10/2007.   ALLERGIES:  No known drug allergies.   OBJECTIVE:  VITAL SIGNS:  Weight 150-1/2 pounds, height 70 inches, temp  97.8, blood pressure 116/82 and pulse 60.  GENERAL:  He is a well-developed, well-nourished Caucasian male, in no  acute distress.  HEENT:  Sclerae clear, non-injected.  Conjunctivae pink.  Oropharynx  pink and moist without any lesions.  CHEST:  Heart regular rate and rhythm.  Normal S1, S2 without any  murmurs, clicks, rubs or gallops.  ABDOMEN:  Positive bowel sounds x4.  No bruits auscultated.  Soft,  nontender,  nondistended without palpable mass or hepatosplenomegaly.  No  rebound tenderness or guarding.  EXTREMITIES:  Without clubbing or edema bilaterally.   ASSESSMENT:  The patient is a 55 year old Caucasian male with history of  chronic abdominal pain and constipation.  He was recently found to have  an adenomatous polyp and otherwise normal colonoscopy along with a  hyperplastic polyp.  I suspect constipation may be contributing to his  chronic abdominal pain.   He also has recently been diagnosed with hepatitis-C and is starting  treatment at the [facility] in [place] this month.   PLAN:  1. He is to increase the fiber in his diet.  2. Fiber supplement of choice.  I have given him several names today      including Fiber sure, Benefiber or fiber of choice or Metamucil.  3. Colace 100 mg once or twice daily as needed for constipation.  4. He is to follow up with Hep-C treatment in [place].  5. If he has recurrent pain, he is going to call us.  6. Colonoscopy in April of 2013 for follow up adenomatous polyp.       [name],  N.P.  Electronically Signed     Kassie Mends, M.D.  Electronically Signed    KJ/MEDQ  D:  01/10/2007  T:  01/11/2007  Job:  [id number]   cc:   Jacklyn Shell, C.N.M.

## 2010-12-23 NOTE — Op Note (Signed)
NAME:  Marc, [name] NO.:  [id number]   MEDICAL RECORD NO.:  [id number]          PATIENT TYPE:  OIB   LOCATION:  3537                         FACILITY:  [facility]   PHYSICIAN:  Reinaldo Meeker, M.D. DATE OF BIRTH:  07-09-1956   DATE OF PROCEDURE:  04/11/2009  DATE OF DISCHARGE:                               OPERATIVE REPORT   PREOPERATIVE DIAGNOSIS:  Herniated disk and spondylosis with marked  degenerative disease in C5-6.   POSTOPERATIVE DIAGNOSIS:  Herniated disk and spondylosis with marked  degenerative disease in C5-6.   PROCEDURE:  C5-6 anterior cervical diskectomy with bone bank fusion  followed by MYSTIQUE anterior cervical plating.   SURGEON:  Reinaldo Meeker, MD   ASSISTANT:  Tia Alert, MD   PROCEDURE IN DETAIL:  After being placed in the supine position in 5  pounds of halter traction, the patient's neck was prepped and draped in  the usual sterile fashion.  Localizing x-ray was taken prior to incision  to identify the appropriate level.  Transverse incision was made at the  right anterior neck starting at the midline and headed towards the  medial aspect of the sternocleidomastoid muscle.  The platysma muscle  was then incised transversely.  The natural fascial plane between the  strap muscles medially and the sternocleidomastoid laterally was  identified and followed down to the anterior aspect of the cervical  spine.  Longus colli muscles were identified, split in midline, stripped  away bilaterally with Kittner dissector unipolar coagulation.  Self-  retaining retractor was placed for exposure and x-rays showed approach  to the appropriate level.  Using a 15 blade, the outer annulus of the  disk at C5-6 was incised.  The disk was markedly collapsed and therefore  we used the drill to follow down the small remnant of the disk space.  Microscope was then draped, brought into field and used through the  remainder of the case.  Once again  using a high-speed drill, the  remainder of the bone down to the bony overgrowth was removed.  Using  small Kerrison punches, residual bone compressing the spinal dura and  proximal foraminal nerve roots bilaterally was removed until we had well  decompressed and visualized the spinal dura and nerve roots bilaterally.  The foramina were found to be widely patent at this time.  At this time,  irrigation was carried out.  Measurements were taken and a 7 mm bone  bank plug was reconstituted.  After irrigating once more and confirming  hemostasis, the plug was impacted without difficulty and fluoroscopy  showed to be in good position.  A 19-mm MYSTIQUE  anterior cervical  plate was then chosen.  Under fluoroscopic guidance, drill holes were  placed followed by tapping, re-tapping and placing 15-mm screws x4 until  the locking mechanism was completely secured.  Final fluoroscopy showed  excellent placement of the screws and interbody device.  Large amounts  of irrigation were carried at this time and bleeding controlled with  bipolar coagulation.  The wound was then closed with interrupted Vicryl  on the platysma  muscle and inverted 5-0 PDS on the subcuticular layer  and Steri-Strips were placed on the skin.  A sterile dressing and soft  collar applied.  The patient was extubated and taken to recovery room in  stable condition.          ______________________________  Reinaldo Meeker, M.D.    ROK/MEDQ  D:  04/11/2009  T:  04/12/2009  Job:  [id number]

## 2010-12-23 NOTE — Assessment & Plan Note (Signed)
NAME:  Marc [name], Marc [name]                 CHART#:  [id number]   DATE:  05/03/2007                       DOB:  [birth year]   CHIEF COMPLAINT:  Abdominal pain.   SUBJECTIVE:  The patient is here for a follow-up.  He was last seen in  June of 2008.  He has a history of chronic abdominal pain and  constipation.  He also has a history of hepatitis C and is currently on  antiviral therapy through the HCV clinic in [place].  He has been on  therapy for two months.  He states that he continues to have chronic  daily right upper quadrant abdominal pain.  He is rarely without any  pain.  At times the pain is more severe than at others.  He has  occasional pain postprandially, but it can be worse with movement as  well.  He has had some decreased in appetite and nausea on antiviral  therapy.  He has rarely vomited.  He has noticed diarrhea for the past  six weeks or so on antiviral therapy.  He is having 2-3 stools a day and  he is taking Imodium daily.  He denies any melena or rectal bleeding.  His weight is up 5 pounds since we last saw him.   CURRENT MEDICATIONS:  See updated list.   ALLERGIES:  No known drug allergies.   PHYSICAL EXAMINATION:  VITAL SIGNS:  Weight 155, height 5 feet 11  inches, temperature 98.3, blood pressure 120/80, pulse 72.  GENERAL:  Pleasant, thin, Caucasian male in no acute distress.  SKIN:  Warm and dry, no jaundice.  HEENT:  Sclerae anicteric.  Oropharyngeal mucosa moist and pink.  CHEST:  Lungs clear to auscultation.  HEART:  Regular rate and rhythm with no murmurs, rubs, or gallops.  ABDOMEN:  Positive bowel sounds.  Soft, nondistended.  He has mild  tenderness in the right upper quadrant, right mid abdomen to deep  palpation.  No rebound tenderness or guarding.  No abdominal bruits or  hernias.  No hepatosplenomegaly or masses.  EXTREMITIES:  Lower extremities; no edema.   IMPRESSION:  The patient is a 55 year old gentleman with chronic  abdominal pain in the  right upper quadrant.  The pain appears to be  related to movement and not so much meals.  He has had occasional nausea  and abdominal pain postprandially.  He has a history of chronic  constipation, but since being on antiviral therapy, he has diarrhea.  He  is somewhat immunocompromised and actually states that he is leukopenic.  He is not at the point that they have recommended Neulasta injections.  He is on antiviral therapy for chronic hepatitis C. I would be concerned  about infectious colitis versus side effect from antiviral therapy.   With regards to the right upper quadrant abdominal pain, etiology is not  determined.  He has had multiple studies which have failed to reveal a  cause for this pain.  Potentially this is musculoskeletal.  We will  treat for possible functional abdominal pain, although, symptoms are  very typical.  The patient has typical GERD symptoms.   PLAN:  1. Stools for C. difficile, O&P culture, and WBC.  2. Prevacid 30 mg daily given for GERD #20 samples.  3. Bentyl 10 mg p.o. t.i.d. p.r.n. abdominal pain and  diarrhea #60      with 0 refills to be called in.  The patient was actually given      prescription for HyoMax but the pharmacy called asking for      substitute secondary to cost.  4. Office visit in three weeks with Kassie Mends, M.D.       Tana Coast, P.A.  Electronically Signed     Kassie Mends, M.D.  Electronically Signed    LL/MEDQ  D:  05/03/2007  T:  05/04/2007  Job:  [id number]

## 2010-12-23 NOTE — Assessment & Plan Note (Signed)
NAME:  CHOU, BUSLER                 CHART#:  [id number]   DATE:  07/06/2007                       DOB:  Mar 18, 1956   CHIEF COMPLAINT:  Follow up abdominal bloating, diarrhea and chronic  right upper quadrant pain.   SUBJECTIVE:  Mr. Pariseau is a 55 year old male with history of chronic  right upper quadrant pain, felt to have functional gut disorder.  He is  also undergoing treatment for hepatitis C with Ribavirin, interferon at  the [facility] by Dr. Hermelinda Medicus.  He has recently been  started on Procrit weekly.  He tells me he is completing his 15th week  of treatment this week.  He was seen by Dr. Cira Servant on May 25, 2007.  He was started on Benefiber and p.r.n. dicyclomine.  He tells me he is  doing well with the Benefiber.  He is having very rare episodes of right  upper quadrant pain, occasionally hot cramps, which will last less than  a few minutes a couple of times per day.  His bowel movements have been  generally loose since he started hepatitis C treatment, about 3-4 times  a day.  He denies any rectal bleeding or melena.  Overall, he is much  improved.  He denies any fevers, chills.  Does have fatigue since he  started treatment for hepatitis.  He has not tried probiotics as  directed by Dr. Cira Servant as he could not remember the name of what he was  supposed to get.   CURRENT MEDICATIONS:  See the list from 07/06/2007.   ALLERGIES:  No known drug allergies.   OBJECTIVE:  VITAL SIGNS:  Weight 153 pounds, height 71 inches,  temperature 98.79, blood pressure 100/74, pulse 72.  GENERAL:  Mr. Peatross is a thin Caucasian male who is alert, pleasant,  cooperative, no acute distress.  HEENT:  Sclerae clear, nonicteric.  Conjunctivae pink.  Oropharynx pink  and moist without any lesions.  HEART:  Regular rate and rhythm, normal S1, S2, without murmurs, rubs or  gallops.  ABDOMEN:  Positive bowel sounds x 4.  No bruits auscultated.  Soft,  nontender, nondistended  without palpable masses or hepatosplenomegaly.  No rebound, guarding.  EXTREMITIES:  No clubbing, cyanosis or edema.  SKIN:  Pink, warm and dry without any rash or jaundice.   ASSESSMENT:  Mr. Levit has chronic right upper quadrant pain thought to  be a functional gut disorder.  He has longstanding history of chronic  constipation, more loose stools since he started hepatitis treatment.  He has responded well to fiber.  He has GERD, well controlled on  Prevacid 30 mg daily.   PLAN:  1. Continue Prevacid 30 mg daily, #31 with 11 refills.  2. Dicyclomine p.r.n.  3. He can add Phillip's Colon Health, Flora-Q or Align once daily to      his regimen.  4. He is going to get a copy of his recent laboratory studies from      hepatitis C clinic for his chart and our review.  5. Office visit in 6 months or sooner if he has any further problems.       [name], N.P.  Electronically Signed     Kassie Mends, M.D.  Electronically Signed    KJ/MEDQ  D:  07/06/2007  T:  07/06/2007  Job:  [id number]   cc:   Patrica Duel, M.D.

## 2010-12-23 NOTE — Assessment & Plan Note (Signed)
NAME:  Marc [name], Marc [name]                 CHART#:  [id number]   DATE:  01/10/2008                       DOB:  July 03, 1956   REFERRING PHYSICIAN:  1. Patrica Duel, M.D.  2. Kirk Ruths, M.D.   PROBLEMS:  1. Hepatitis C.  2. Abdominal bloating, diarrhea, and abdominal pain likely secondary      to functional GERD disorder.  3. History of constipation.  4. Gastroesophageal reflux disease.  5. History of thyroiditis.  6. Appendectomy.  7. History of adenomatous polyps in April 2008 (3 mm and 5 mm).   SUBJECTIVE:  Marc [name] is a 55 year old male who is seen as return  patient visit.  He was last seen in November 2008.  He continues the  treatment for hepatitis C with interferon and ribavirin.  He expects the  end of therapy in November 2009.  He complains of abdominal pain that  wakes him up at 5 a.m.  He states he gets bent over, uses the bathroom  which is usually solid, then goes 2 more times, and his abdominal cramps  are relieved.  He has had these symptoms for the last 1-2 months.  He  has tried dicyclomine twice a day but has never taken any at bedtime on  a regular basis.  He felt the HyaMax and Benefiber did not work.  He is  constantly up during the night, he says every hour using the bathroom  (urinating).  He is seeing Dr. Marva Panda in [facility] as well as  Hematology/Oncology due to his hepatitis C therapy, which has been  complicated by neutropenia and anemia.  His nausea associated with  therapy is taken care of by Phenergan half to one, 1-2 times a day.  He  also uses Zantac.  He uses ibuprofen over-the-counter up to 6 a day,  daily for headaches.  He denies any blood in the stool.  He only has  diarrhea 3 times a week.  His abdominal discomfort is below his belly  button.   MEDICATIONS:  1. Interferon 120 mcg weekly.  2. Ribavirin 800 mg b.i.d.  3. Tylenol as needed.  4. Ibuprofen.  5. Celexa 40 mg daily.  6. Phenergan 25 mg as needed.  7. Imodium as  needed.  8. Prevacid 30 mg daily.  9. Procrit.  10.Neupogen.  11.Wellbutrin 300 mg daily.  12.Dicyclomine.   OBJECTIVE:  Weight 143 pounds (down 10 pounds from December 2008), BMI  19.9 (slightly underweight), temperature 98.4, blood pressure 100/72,  and pulse 16.  GENERAL:  He is in no apparent distress with good eye contact.  He is  alert and oriented x4.LUNGS:  Clear to auscultation  bilaterally.CARDIOVASCULAR:  Regular rhythm.  No murmurs.ABDOMEN:  Bowel  sounds are present, soft, nontender, and nondistended.   LABORATORY DATA:  On January 09, 2008:  White count 1.8, hemoglobin 11, and  platelet count 145.   ASSESSMENT:  Marc [name] is a 55 year old male who is with abdominal  discomfort relieved by bowel movements consistent with irritable bowel  syndrome.  He intermittently has diarrhea as well.  Thank you for  allowing me to see Marc [name] in consultation.  My recommendations are as  follows.   RECOMMENDATIONS:  1. He was given his discharge instructions in writing. He was asked to  avoid carbonated beverages and caffeine.  He was asked to continue      to drink plenty of water.  2. He is to take Prevacid 30 minutes prior to his first meal.  He is      to follow the reflux handout recommendations.  3. He is to take Bentyl 2 every night and then twice daily as needed      for abdominal pain.  I did discuss the side effects that may be      associated with a higher dose, such as dry mouth, dry eyes,      drowsiness, and urinary retention.  4. He has a follow up visit in 6 months.       Kassie Mends, M.D.  Electronically Signed     SM/MEDQ  D:  01/10/2008  T:  01/11/2008  Job:  [id number]   cc:   Barnetta Chapel, M.D.  Patrica Duel, M.D.

## 2010-12-23 NOTE — Assessment & Plan Note (Signed)
NAME:  Marc [name], Marc [name]                 CHART#:  [id number]   DATE:  05/25/2007                       DOB:  [birth year]   REFERRING PHYSICIAN:  Dr. Nobie Putnam.   PROBLEM LIST:  1. Hepatitis C.  2. History of thyroiditis.  3. Appendectomy.  4. Constipation, now with diarrhea on the weekends when he takes his      interferon for the last 12 weeks.  5. Chronic right upper quadrant abdominal pain.   SUBJECTIVE:  The patient is a 55 year old male who presents as a return  patient visit.  He was last seen by me in March of 2008 when I performed  a colonoscopy and an upper endoscopy.  He was initially seen by Marc [name] for his abdominal pain.  There was a question of whether or not he  had malrotation based on a CT scan of the chest which was performed in  March of 2008.  He had a CT of his chest performed for right rib pain  and right upper quadrant pain.  The CT scan showed constipation.  He  also had a corkscrew appearance with superior mesenteric artery and  vein.  In March of 2008, he had an AST of 108 and an ALT of 176 with a  total bili of 0.5 and an alk phos of 65.  CT scan of the abdomen in  April of 2008 showed no evidence of malrotation.  The superior  mesenteric artery and vein retained their expected relationship without  findings of small bowel malrotation.  The finding on the CT scan of the  chest was simulated vascular finding of small bowel malrotation.  His  upper endoscopy was normal, and he had a small polyp removed from his  sigmoid colon.  He had also a polyp removed from his transverse colon.  The polyps had adenomatous and hyperplastic changes.  He was seen in  followup in June 2008, and was complaining of chronic abdominal pain as  well as constipation.  He was asked to use metamucil and Colace and  continue his hepatitis C treatment in [name].  He returned in  September of 2008 complaining of abdominal pain and diarrhea.  His stool  was checked for C-diff and  fecal lactoferrin.  All tests were negative.  He also was begun on Bentyl that he can use as needed for loose stools.  He returns today complaining of pain in his upper abdomen.  He has been  on ribavirin and interferon treatment for approximately 12 weeks.  He  gets watery stool when he takes the interferon shot.  He reports that he  was told his hemoglobin is 9.1, his ribavirin has been decreased.  His  watery stool is every now and then.  The Bentyl helps.  His right upper  quadrant pain, he has had forever.  It does not keep him awake at  nighttime.  It is sharp.  Sometimes it is in the left upper quadrant but  usually in the right upper quadrant.  It is not associated with nausea,  vomiting, or fever.  It lasts minutes.  Sometimes he has to sit down and  take a few minutes to let it pass.  He still has his gallbladder.  He  usually has 1 bowel movement a day, and sometimes 3 a  day if he is on  his interferon.  He continues to have bloating.  His appetite has  decreased because he feels full and his tastes have changed since being  on therapy.   MEDICATIONS:  1. Klonopin.  2. Interferon.  3. Ribavirin.  4. Tylenol as needed.  5. Ibuprofen 1 g daily.  6. Celexa 40 mg daily.  7. Phenergan as needed.  8. Imodium as needed.  9. Prevacid 30 mg daily.  10.Bentyl 3 times a day as needed.   OBJECTIVE:  Weight 155 pounds (down 4 pounds since March 2008), height 5  feet 10 inches, BMI 22.2 (healthy), temperature 98.2, blood pressure  118/68, pulse 72.  IN GENERAL:  He is in no apparent distress, alert and oriented x4.  His lungs are clear to auscultation bilaterally.  CARDIOVASCULAR EXAM:  Regular rhythm, no murmur, normal S1 and S2.  ABDOMEN:  Bowel sounds are present, soft, mild tenderness to palpation  in the right upper quadrant without rebound or guarding.  Negative  Carnett's sign.  He has no pulsatile masses of abdominal bruits.  NEURO:  He has no focal of neurological  deficits.   ASSESSMENT:  The patient is a 55 year old male who has had an extensive  workup for chronic right upper quadrant pain.  He most likely has a  functional gut disorder.  He is currently undergoing treatment for his  hepatitis C with ribavirin and interferon.  I believe his intermittent  loose stools are associated with the medication side effects.  Thank you  for allowing me to see the patient in consultation.  My recommendations  follow.   RECOMMENDATIONS:  1. He should continue his Prevacid and his as-needed dicyclomine.  2. He is asked to add probiotic (Digestive Advantage, Vear Clock' Colon      Health, or other lactobacillus products), and he is asked to add      Benefiber daily.  He is given 4 samples.  If the Benefiber causes      an increase in bloating, then he may discontinue the use of the      Benefiber and continue on the probiotic only.  3. Followup visit to see Marc [name] in 6 weeks to reevaluate his      bloating, diarrhea, and abdominal pain.       Marc [name], M.D.  Electronically Signed     SM/MEDQ  D:  05/25/2007  T:  05/26/2007  Job:  [id number]   cc:   Marc [name], M.D.

## 2010-12-26 NOTE — Op Note (Signed)
NAME:  Marc [name], Marc [name] NO.:  [id number]   MEDICAL RECORD NO.:  [id number]          PATIENT TYPE:  AMB   LOCATION:  DAY                           FACILITY:  [facility]   PHYSICIAN:  Kassie Mends, M.D.      DATE OF BIRTH:  24-Mar-1956   DATE OF PROCEDURE:  12/08/2006  DATE OF DISCHARGE:                               OPERATIVE REPORT   PROCEDURE:  1. Colonoscopy with cold forceps polypectomy.  2. Esophagogastroduodenoscopy.   INDICATIONS FOR EXAMINATION:  Mr. Amsden is a 55 year old male who  presents with abdominal pain, weight loss and constipation.  He is  average risk for developing colon cancer.   FINDINGS:  1. A 3 mm sigmoid colon polyp removed via cold forceps.  A 5 mm      transverse colon polyp removed via cold forceps.  Otherwise no      masses, inflammatory changes, AVMs, diverticula or internal      hemorrhoids seen.  2. Normal esophagus without evidence of Barrett's erosions or      ulcerations.  Normal stomach, duodenal bulb and second portion of      the duodenum.   RECOMMENDATIONS:  1. No aspirin, NSAIDs or anticoagulation for 3 days.  2. Mr. Sensabaugh is given handout on high-fiber diet, constipation and      polyps.  3. Screening colonoscopy in 5 years years if polyp is adenomatous.  4. Follow up appointment one month with  Orson Ape for      abdominal pain.   MEDICATIONS:  1. Demerol 100 mg IV.  2. Versed 8 mg IV.  3. Phenergan 12.5 mg IV.   PROCEDURE TECHNIQUE:  Physical exam was performed.  Informed consent was  obtained from the patient after explaining benefits, risks and  alternatives to the procedure.  The patient was connected to the monitor  and placed in left lateral position.  Continuous oxygen provided by  nasal cannula and IV medicine administered through an indwelling  cannula.  After administration of sedation and rectal exam, the  patient's rectum was intubated and the endoscope was advanced under  direct visualization to  the cecum.  The scope was subsequently slowly  removed after carefully examining the color, texture, anatomy, integrity  of mucosa on the way out.   After the colonoscopy, the patient's esophagus intubated with a  diagnostic gastroscope and scope was advanced under direct visualization  to the second portion of duodenum.  The scope was subsequently removed  slowly but carefully examining the color, texture, anatomy and integrity  of the mucosa on the way out.  The patient was recovered in endoscopy  and discharged home in satisfactory condition.      Kassie Mends, M.D.  Electronically Signed     SM/MEDQ  D:  12/08/2006  T:  12/08/2006  Job:  [id number]   cc:   Patrica Duel, M.D.  Fax: [phone]

## 2010-12-26 NOTE — Consult Note (Signed)
NAME:  JAYKO, VOORHEES NO.:  [id number]   MEDICAL RECORD NO.:  [id number]          PATIENT TYPE:  AMB   LOCATION:  DAY                           FACILITY:  [facility]   PHYSICIAN:  Kassie Mends, M.D.      DATE OF BIRTH:  23-Mar-1956   DATE OF CONSULTATION:  11/04/2006  DATE OF DISCHARGE:                                 CONSULTATION   CHIEF COMPLAINT:  Abdominal pain/possible small bowel malrotation.   HISTORY OF PRESENT ILLNESS:  Mr. Sole is a 55 year old Caucasian male  that tells me over the last couple of years he has had intermittent  abdominal pain.  The pain usually begins in the right upper quadrant.  More recently over the last month the pain has become quite constant and  he describes as a dull ache.  At times it is sharp and pinching.  He  states that movement does make the pain worse and it is also worse with  sitting and worse with eating.  He he does have nausea with the pain.  No vomiting.  Pain radiates through to his mid back.  He was seen and  evaluated by Dr. Geanie Logan office.  He had an abdominal ultrasound on  October 15, 2006, which was normal except for fatty liver.  He also had a  CT of the chest with contrast for right rib pain, cough and dyspnea and  was found to have incidentally a small left renal cyst, constipation and  there was a corkscrew appearance of the SMA and vein suggesting small  bowel malrotation.  There was also constipation noted.  He tells me he  does have a bowel movement every 2-3 days.  He has tried some MiraLax  but was not happy with the results and it did make him nauseated, so he  quit taking it.  He denies any rectal bleeding or melena.  He has had a  loss of appetite, has had a 15-20-pound weight loss in the last year,  but he attributes this to alcohol cessation.   PAST MEDICAL HISTORY:  He has a history of hepatitis C.  He recently had  a liver biopsy and is followed by GI doctor in [place].  He is  contemplating  treatment at this time.  He has history of thyroiditis,  diabetes mellitus, appendectomy, tonsillectomy, and right carpal tunnel  release.   CURRENT MEDICATIONS:  Lexapro 20 mg daily, Vicodin p.r.n.   ALLERGIES:  No known drug allergies.   FAMILY HISTORY:  His sister died from metastatic liver cancer.  He is  unsure of the primary source.   SOCIAL HISTORY:  Mr. Sayegh is single.  He has one healthy son.  He is  employed with the [employer].  He has history of IV drug use  back in the 1970s, a 25 pack-year history of tobacco use, heavy alcohol  use for about 10 years but currently does not drink or smoke.   REVIEW OF SYSTEMS:  CONSTITUTIONAL:  Weight has been stable since he  quit smoking although he did have a loss as  described in the HPI.  Denies any fever or chills.  Denies any fatigue.  CARDIOVASCULAR:  See  HPI.  Denies any palpitations.  PULMONARY:  Denies any shortness of  breath, dyspnea, cough or hemoptysis at this time.  GI:  See HPI.  Denies any dysphagia, odynophagia.   PHYSICAL EXAMINATION:  VITAL SIGNS:  Weight 159 pounds, height 71  inches, temperature 98.1, blood pressure 98/68 and pulse 60.  GENERAL:  Mr. Robarts is a well-developed, well-nourished Caucasian male in  no acute stress.  HEENT:  Sclerae are clear, nonicteric.  Conjunctivae pink.  Oropharynx  pink and moist without any lesions.  NECK:  Supple without thyromegaly.  CARDIAC:  Regular rhythm, normal S1, S2, without murmurs, clicks, rubs  or gallops.  LUNGS:  Clear to auscultation bilaterally.  ABDOMEN:  Positive bowel sounds x4.  No bruits auscultated.  Soft and  nondistended.  He does have mild tenderness to the right upper quadrant  on deep palpation.  There is no rebound tenderness or guarding.  No  hepatosplenomegaly or masses.  EXTREMITIES:  Without clubbing or edema bilaterally.  SKIN:  Pink, warm and dry without any rash or jaundice.   IMPRESSION:  Mr. Lyerly is a 55 year old male with a  history of chronic  abdominal pain mostly in the right upper quadrant.  It does radiate to  the back, associated with nausea while but no vomiting.  History of  constipation.  CT of chest incidentally noted a corkscrew appearance of  the superior mesenteric artery and vein suggesting possible small-bowel  malrotation.  His pain is worsened with movement and sitting.  Would  want to rule out small-bowel malrotation, intermittent volvulus,  obstruction in this gentleman.  Will begin workup with complete  abdominal-pelvic films for further evaluation.   He does have chronic constipation, which could be contributing to his  pain as well.  He is intolerant of MiraLax.  He may benefit from Amitiza  but, again, would want to get the CT scan prior to starting this  medication with him.  He is also in need of colonoscopy, especially  given his age.   PLAN:  1. Will schedule CT of the abdomen and pelvis with IV and oral      contrast soon as possible.  2. Once CT is reviewed, will schedule a colonoscopy plus/minus EGD      pending the results.  I have discussed this procedure including the      risks and  benefits to include but not limited to bleeding,      infection, perforation, drug reaction, and consent will be      obtained.   We would like to thank Dr. Nobie Putnam for allowing [name] to participate in  the care of Mr. Couzens.      Nicholas Lose, N.P.      Kassie Mends, M.D.  Electronically Signed    KC/MEDQ  D:  11/04/2006  T:  11/04/2006  Job:  [id number]   cc:   Patrica Duel, M.D.  Fax: [phone]

## 2011-01-02 ENCOUNTER — Telehealth: Payer: Self-pay | Admitting: Family Medicine

## 2011-01-02 ENCOUNTER — Encounter (INDEPENDENT_AMBULATORY_CARE_PROVIDER_SITE_OTHER): Payer: PRIVATE HEALTH INSURANCE | Admitting: Psychiatry

## 2011-01-02 DIAGNOSIS — F331 Major depressive disorder, recurrent, moderate: Secondary | ICD-10-CM

## 2011-01-02 NOTE — Telephone Encounter (Signed)
Order provided as requested.

## 2011-01-03 LAB — HEPATIC FUNCTION PANEL
ALT: 41 U/L (ref 0–53)
Albumin: 4.7 g/dL (ref 3.5–5.2)
Indirect Bilirubin: 0.4 mg/dL (ref 0.0–0.9)
Total Protein: 7.1 g/dL (ref 6.0–8.3)

## 2011-01-03 LAB — BASIC METABOLIC PANEL
BUN: 27 mg/dL — ABNORMAL HIGH (ref 6–23)
Creat: 1.02 mg/dL (ref 0.40–1.50)
Potassium: 5 mEq/L (ref 3.5–5.3)

## 2011-01-20 ENCOUNTER — Telehealth: Payer: Self-pay

## 2011-01-20 ENCOUNTER — Encounter (HOSPITAL_COMMUNITY): Payer: PRIVATE HEALTH INSURANCE | Admitting: Psychiatry

## 2011-01-20 NOTE — Progress Notes (Signed)
Cc to PCP 

## 2011-01-20 NOTE — Telephone Encounter (Signed)
Pt called and said he has not had a BM in 2 days and he normally has 2-3 daily. Said when he gets like this he gets nauseated too, and wants to know what to do. 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 Please advise! He is aware it is 4:30 Pm and probably will be tomorrow before he hears back from [name].

## 2011-01-21 NOTE — Telephone Encounter (Signed)
LMOM to call.

## 2011-01-21 NOTE — Telephone Encounter (Signed)
Please call pt. He should take Miralax at 1p,2p, and 3 p today. Repeat on 6/14 and 6/15 if no BM.

## 2011-01-22 NOTE — Telephone Encounter (Signed)
Pt was informed. I told him he could start today and do the next two days since he could not be reached yesterday. He wants to know what to do if it doesn't work the third day since it will be the week-end. He is also requesting something for nausea.

## 2011-01-22 NOTE — Telephone Encounter (Signed)
Pt informed. Rx called to [facility] @ [facility].

## 2011-01-22 NOTE — Telephone Encounter (Signed)
LMOM to call.

## 2011-01-22 NOTE — Telephone Encounter (Signed)
Please call pt. He may call [facility] operator [phone] over the weekend if he does not have a BM. He needs an appt to be seen for constipation. Call in Phenergan 25 mg tabs 1/2-1 po q4-6h prn NV, #20, rfx0.

## 2011-01-23 ENCOUNTER — Telehealth: Payer: Self-pay | Admitting: Family Medicine

## 2011-01-23 ENCOUNTER — Encounter (INDEPENDENT_AMBULATORY_CARE_PROVIDER_SITE_OTHER): Payer: PRIVATE HEALTH INSURANCE | Admitting: Psychiatry

## 2011-01-23 DIAGNOSIS — F331 Major depressive disorder, recurrent, moderate: Secondary | ICD-10-CM

## 2011-01-23 NOTE — Telephone Encounter (Signed)
pls get info on the knee

## 2011-01-26 ENCOUNTER — Other Ambulatory Visit: Payer: Self-pay | Admitting: Family Medicine

## 2011-01-26 DIAGNOSIS — M25561 Pain in right knee: Secondary | ICD-10-CM

## 2011-01-26 NOTE — Telephone Encounter (Signed)
Right knee pain, feels like bone on bone, mainly on the inside of his knee. The chiropractor said he can feel some tendons that are possibly torn. He has been icing it and exercising but it is still really bothering him.  Wants to be referred in [facility]

## 2011-01-26 NOTE — Telephone Encounter (Signed)
See phone message

## 2011-01-26 NOTE — Telephone Encounter (Signed)
pls refer pt to ortho of his choice in [place] re right knee pain, referral entered

## 2011-01-27 ENCOUNTER — Encounter (HOSPITAL_COMMUNITY): Payer: PRIVATE HEALTH INSURANCE | Admitting: Psychiatry

## 2011-01-29 ENCOUNTER — Telehealth: Payer: Self-pay | Admitting: Family Medicine

## 2011-01-29 NOTE — Telephone Encounter (Signed)
Patient voicemail full 

## 2011-01-30 ENCOUNTER — Other Ambulatory Visit: Payer: Self-pay | Admitting: Family Medicine

## 2011-01-30 ENCOUNTER — Telehealth: Payer: Self-pay | Admitting: Family Medicine

## 2011-01-30 MED ORDER — AMLODIPINE BESYLATE 5 MG PO TABS: 5.0000 mg | ORAL_TABLET | Freq: Every day | ORAL | Status: DC

## 2011-01-30 NOTE — Telephone Encounter (Signed)
Med sent as requested 

## 2011-02-13 ENCOUNTER — Encounter (INDEPENDENT_AMBULATORY_CARE_PROVIDER_SITE_OTHER): Payer: PRIVATE HEALTH INSURANCE | Admitting: Psychiatry

## 2011-02-13 DIAGNOSIS — F331 Major depressive disorder, recurrent, moderate: Secondary | ICD-10-CM

## 2011-02-24 ENCOUNTER — Other Ambulatory Visit: Payer: Self-pay | Admitting: Family Medicine

## 2011-02-24 ENCOUNTER — Ambulatory Visit (HOSPITAL_COMMUNITY)
Admission: RE | Admit: 2011-02-24 | Discharge: 2011-02-24 | Disposition: A | Discharge status: Home / Self Care | Payer: PRIVATE HEALTH INSURANCE | Source: Ambulatory Visit | Attending: Orthopedic Surgery | Admitting: Orthopedic Surgery

## 2011-02-24 DIAGNOSIS — M7989 Other specified soft tissue disorders: Secondary | ICD-10-CM | POA: Insufficient documentation

## 2011-02-24 DIAGNOSIS — M79609 Pain in unspecified limb: Secondary | ICD-10-CM | POA: Insufficient documentation

## 2011-03-03 ENCOUNTER — Encounter (INDEPENDENT_AMBULATORY_CARE_PROVIDER_SITE_OTHER): Payer: PRIVATE HEALTH INSURANCE | Admitting: Psychiatry

## 2011-03-03 ENCOUNTER — Other Ambulatory Visit: Payer: Self-pay

## 2011-03-03 ENCOUNTER — Telehealth: Payer: Self-pay | Admitting: Family Medicine

## 2011-03-03 DIAGNOSIS — F331 Major depressive disorder, recurrent, moderate: Secondary | ICD-10-CM

## 2011-03-03 MED ORDER — CLONAZEPAM 0.5 MG PO TABS: 0.5000 mg | ORAL_TABLET | Freq: Every day | ORAL | Status: DC | PRN

## 2011-03-03 NOTE — Telephone Encounter (Signed)
noted 

## 2011-03-03 NOTE — Telephone Encounter (Signed)
Sent to [facility]
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
## 2011-03-06 ENCOUNTER — Encounter (HOSPITAL_COMMUNITY): Payer: PRIVATE HEALTH INSURANCE | Admitting: Psychiatry

## 2011-03-12 ENCOUNTER — Encounter: Payer: Self-pay | Admitting: Gastroenterology

## 2011-03-17 ENCOUNTER — Ambulatory Visit (INDEPENDENT_AMBULATORY_CARE_PROVIDER_SITE_OTHER): Payer: PRIVATE HEALTH INSURANCE | Admitting: Family Medicine

## 2011-03-17 ENCOUNTER — Telehealth: Payer: Self-pay

## 2011-03-17 ENCOUNTER — Encounter (INDEPENDENT_AMBULATORY_CARE_PROVIDER_SITE_OTHER): Payer: PRIVATE HEALTH INSURANCE | Admitting: Psychiatry

## 2011-03-17 ENCOUNTER — Encounter: Payer: Self-pay | Admitting: Family Medicine

## 2011-03-17 VITALS — BP 140/88 | HR 85 | Resp 16 | Ht 70.0 in | Wt 158.1 lb

## 2011-03-17 DIAGNOSIS — K589 Irritable bowel syndrome without diarrhea: Secondary | ICD-10-CM

## 2011-03-17 DIAGNOSIS — F331 Major depressive disorder, recurrent, moderate: Secondary | ICD-10-CM

## 2011-03-17 DIAGNOSIS — R109 Unspecified abdominal pain: Secondary | ICD-10-CM

## 2011-03-17 DIAGNOSIS — I10 Essential (primary) hypertension: Secondary | ICD-10-CM

## 2011-03-17 MED ORDER — PROMETHAZINE HCL 12.5 MG PO TABS: 12.5000 mg | ORAL_TABLET | Freq: Two times a day (BID) | ORAL | Status: DC | PRN

## 2011-03-17 MED ORDER — TRAMADOL HCL 50 MG PO TABS: 50.0000 mg | ORAL_TABLET | Freq: Two times a day (BID) | ORAL | Status: DC

## 2011-03-17 MED ORDER — OMEPRAZOLE MAGNESIUM 20 MG PO TBEC: 20.0000 mg | DELAYED_RELEASE_TABLET | Freq: Two times a day (BID) | ORAL | Status: DC

## 2011-03-17 MED ORDER — AMLODIPINE BESYLATE 5 MG PO TABS: 5.0000 mg | ORAL_TABLET | Freq: Every day | ORAL | Status: DC

## 2011-03-17 NOTE — Telephone Encounter (Signed)
Pt consistently misses appts, he needs to be seen in the office. He may also spk with GI since he is an established pt there. Dr Horace Porteous to see him, I discussed with her. Print production planner will call him

## 2011-03-17 NOTE — Telephone Encounter (Signed)
Dr. Jeanice Lim to see him today

## 2011-03-17 NOTE — Assessment & Plan Note (Signed)
Patient has no red flags on exam. He has history of irritable bowel syndrome. The sides yesterday when he had diarrhea it appears his bowel movements are at baseline. He will continue his probiotic and MiraLAX as needed

## 2011-03-17 NOTE — Assessment & Plan Note (Addendum)
Patient has chronic abdominal pain. At this time I think his epigastric pain is secondary to the large amount of NSAIDS he was taking. I will stop the Volteran and  the ibuprofen. increase his PPI to twice a day If this does not resolve his pain with within the next 3 weeks I will send him back to GI for upper endoscopy. I will give him a short-term course of Phenergan as needed As there is no evidence of bleed at this time no labs will be drawn today.
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
## 2011-03-17 NOTE — Telephone Encounter (Signed)
Patient walked in earlier and stated that his meds have been hurting his stomach. He has been taking Depakote, prozac, Asa, voltaren and he thinks he needs something to help coat his stomach. Can he get something called into [place]?

## 2011-03-17 NOTE — Assessment & Plan Note (Addendum)
Amlodipine was refilled. No change today

## 2011-03-17 NOTE — Patient Instructions (Signed)
Stop taking the Ibuprofen and Diclofenac (Voltaren) Increase your Prilosec to 1 tablet in the morning and 1 in the evening Use the ultram as needed for pain You can take tylenol twice a day if needed- but be careful with your liver I have refilled your blood pressure pill If you have vomiting, blood in vomit or stool or you get worse call for instructions Next visit in 3 weeks

## 2011-03-20 ENCOUNTER — Encounter (INDEPENDENT_AMBULATORY_CARE_PROVIDER_SITE_OTHER): Payer: PRIVATE HEALTH INSURANCE | Admitting: Psychiatry

## 2011-03-20 ENCOUNTER — Telehealth: Payer: Self-pay | Admitting: Family Medicine

## 2011-03-20 ENCOUNTER — Encounter (HOSPITAL_COMMUNITY): Payer: PRIVATE HEALTH INSURANCE | Admitting: Psychiatry

## 2011-03-20 DIAGNOSIS — F331 Major depressive disorder, recurrent, moderate: Secondary | ICD-10-CM

## 2011-03-22 NOTE — Telephone Encounter (Signed)
Not certain why he is even taking tramadol, I have not evaluated the pt since last year, since he was recently seen by dr Jeanice Lim, I recommend this request e directed to her

## 2011-03-23 ENCOUNTER — Telehealth: Payer: Self-pay | Admitting: Family Medicine

## 2011-03-23 NOTE — Telephone Encounter (Signed)
I reviewed his note, he is on Tramadol secondary to knee injury, He may increase Tramadol to three times a day, if this does not help he should see his orthopedic surgeon. Because of our last visit with his abdominal pain, he should not take any anti-inflammatories. He will need to address his sleep at an office visit if this is something unrelated to his pain

## 2011-03-23 NOTE — Telephone Encounter (Signed)
This is a duplicate message, waiting for response from doctor

## 2011-03-24 NOTE — Telephone Encounter (Signed)
Called patient left message

## 2011-03-24 NOTE — Telephone Encounter (Signed)
Patient aware.

## 2011-04-02 ENCOUNTER — Other Ambulatory Visit: Payer: Self-pay | Admitting: Family Medicine

## 2011-04-06 ENCOUNTER — Telehealth: Payer: Self-pay | Admitting: Family Medicine

## 2011-04-07 NOTE — Telephone Encounter (Signed)
He has been taking soma for several years tid. He wants to wean himself off the [place]. Dr Margarita Rana prescribed. He don't see him anymore. He has 2 left. Wants to know if he can get a months supply to try to wean himself off. Robbie Lis

## 2011-04-07 NOTE — Telephone Encounter (Signed)
Please let Mr. Votaw know that we do not fill this medication. He needs to call whoever prescribes him this medication. He can then taper down, to twice a day x 1 week, then 1 a day, then stop.

## 2011-04-09 ENCOUNTER — Ambulatory Visit: Payer: PRIVATE HEALTH INSURANCE | Admitting: Gastroenterology

## 2011-04-10 ENCOUNTER — Encounter (HOSPITAL_COMMUNITY): Payer: PRIVATE HEALTH INSURANCE | Admitting: Psychiatry

## 2011-04-15 ENCOUNTER — Other Ambulatory Visit: Payer: Self-pay

## 2011-04-15 ENCOUNTER — Encounter: Payer: Self-pay | Admitting: Gastroenterology

## 2011-04-15 ENCOUNTER — Ambulatory Visit (INDEPENDENT_AMBULATORY_CARE_PROVIDER_SITE_OTHER): Payer: PRIVATE HEALTH INSURANCE | Admitting: Gastroenterology

## 2011-04-15 DIAGNOSIS — Z860100 Personal history of colon polyps, unspecified: Secondary | ICD-10-CM

## 2011-04-15 DIAGNOSIS — R1013 Epigastric pain: Secondary | ICD-10-CM

## 2011-04-15 DIAGNOSIS — K3189 Other diseases of stomach and duodenum: Secondary | ICD-10-CM

## 2011-04-15 NOTE — Telephone Encounter (Signed)
Called patient, left message.
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
## 2011-04-15 NOTE — Telephone Encounter (Signed)
Patient aware.

## 2011-04-15 NOTE — Assessment & Plan Note (Signed)
Sx not ideally controlled.  Continue Align. Samples given #24. EGD w/i next 2-3 weeks and will Bx small bowel to evaluate for celiac sprue. OPV in 4 mos.

## 2011-04-15 NOTE — Assessment & Plan Note (Signed)
Pt has new onset dyspepsia. Last EGD > 4 years ago. Differential diagnosis includes H. Pylori gastritis, NSAID gastritis. non-ulcer dyspepsia, and less likely gastric CA.  EGD within next 2-3 weeks. Continue OMP. Samples #28 given. OPV in 4 mos.

## 2011-04-15 NOTE — Assessment & Plan Note (Signed)
AVERAGE RISK- 2 SIMPLE ADENOMAS REMOVED < 1 CM.  TCS IN 2018 @ to [facility] ([facility]) screening guidelines 2008

## 2011-04-16 ENCOUNTER — Encounter: Payer: Self-pay | Admitting: Gastroenterology

## 2011-04-16 NOTE — Progress Notes (Signed)
Cc to PCP 

## 2011-04-16 NOTE — Progress Notes (Signed)
Reminder in epic to have tcs in 2018 °

## 2011-04-21 ENCOUNTER — Encounter (INDEPENDENT_AMBULATORY_CARE_PROVIDER_SITE_OTHER): Payer: PRIVATE HEALTH INSURANCE | Admitting: Psychiatry

## 2011-04-21 DIAGNOSIS — F331 Major depressive disorder, recurrent, moderate: Secondary | ICD-10-CM

## 2011-04-22 ENCOUNTER — Encounter (INDEPENDENT_AMBULATORY_CARE_PROVIDER_SITE_OTHER): Payer: PRIVATE HEALTH INSURANCE | Admitting: Psychiatry

## 2011-04-22 ENCOUNTER — Ambulatory Visit: Payer: PRIVATE HEALTH INSURANCE | Admitting: Gastroenterology

## 2011-04-22 DIAGNOSIS — F331 Major depressive disorder, recurrent, moderate: Secondary | ICD-10-CM

## 2011-04-29 ENCOUNTER — Telehealth: Payer: Self-pay | Admitting: Gastroenterology

## 2011-04-29 NOTE — Telephone Encounter (Signed)
Pt called to cancel his screening TCS- he said this was a bad time for him and would call us back at a later date

## 2011-05-01 ENCOUNTER — Encounter (HOSPITAL_COMMUNITY): Admission: RE | Payer: Self-pay | Source: Ambulatory Visit

## 2011-05-01 ENCOUNTER — Ambulatory Visit (HOSPITAL_COMMUNITY)
Admission: RE | Admit: 2011-05-01 | Payer: PRIVATE HEALTH INSURANCE | Source: Ambulatory Visit | Admitting: Gastroenterology

## 2011-05-01 SURGERY — EGD (ESOPHAGOGASTRODUODENOSCOPY)
Anesthesia: Moderate Sedation

## 2011-05-06 ENCOUNTER — Encounter (HOSPITAL_COMMUNITY): Payer: PRIVATE HEALTH INSURANCE | Admitting: Psychiatry

## 2011-05-19 ENCOUNTER — Encounter (HOSPITAL_COMMUNITY): Payer: PRIVATE HEALTH INSURANCE | Admitting: Psychiatry

## 2011-05-20 LAB — COMPREHENSIVE METABOLIC PANEL
ALT: 30
AST: 27
Albumin: 4
Alkaline Phosphatase: 43
Calcium: 9.9
GFR calc Af Amer: >60
Potassium: 4.1
Sodium: 137
Total Protein: 6.5

## 2011-05-20 LAB — URINALYSIS, ROUTINE W REFLEX MICROSCOPIC
Glucose, UA: NEGATIVE
Ketones, ur: NEGATIVE
Nitrite: NEGATIVE
Protein, ur: NEGATIVE
pH: 5.5

## 2011-05-20 LAB — CBC
Hemoglobin: 9.5 — ABNORMAL LOW
Platelets: 100 — ABNORMAL LOW
RDW: 16.8 — ABNORMAL HIGH

## 2011-05-20 LAB — RAPID URINE DRUG SCREEN, HOSP PERFORMED
Amphetamines: NOT DETECTED
Barbiturates: POSITIVE — AB
Benzodiazepines: POSITIVE — AB
Tetrahydrocannabinol: NOT DETECTED

## 2011-05-20 LAB — DIFFERENTIAL
Basophils Relative: 0
Eosinophils Absolute: 0
Eosinophils Relative: 0
Monocytes Absolute: 0.3
Neutro Abs: 0.8 — ABNORMAL LOW

## 2011-05-20 LAB — APTT: aPTT: 26

## 2011-05-20 LAB — ACETAMINOPHEN LEVEL: Acetaminophen (Tylenol), Serum: <10 — ABNORMAL LOW

## 2011-05-20 LAB — ETHANOL: Alcohol, Ethyl (B): <5

## 2011-05-25 DIAGNOSIS — IMO0001 Reserved for inherently not codable concepts without codable children: Secondary | ICD-10-CM

## 2011-05-25 HISTORY — DX: Reserved for inherently not codable concepts without codable children: IMO0001

## 2011-06-08 ENCOUNTER — Other Ambulatory Visit: Payer: Self-pay | Admitting: Family Medicine

## 2011-06-09 ENCOUNTER — Ambulatory Visit (HOSPITAL_COMMUNITY)
Admission: RE | Admit: 2011-06-09 | Discharge: 2011-06-09 | Disposition: A | Discharge status: Home / Self Care | Payer: PRIVATE HEALTH INSURANCE | Attending: Psychiatry | Admitting: Psychiatry

## 2011-06-09 DIAGNOSIS — F39 Unspecified mood [affective] disorder: Secondary | ICD-10-CM | POA: Insufficient documentation

## 2011-06-10 ENCOUNTER — Other Ambulatory Visit (HOSPITAL_COMMUNITY): Payer: PRIVATE HEALTH INSURANCE | Attending: Physician Assistant

## 2011-06-10 ENCOUNTER — Ambulatory Visit (INDEPENDENT_AMBULATORY_CARE_PROVIDER_SITE_OTHER): Payer: PRIVATE HEALTH INSURANCE | Admitting: Family Medicine

## 2011-06-10 ENCOUNTER — Encounter: Payer: Self-pay | Admitting: Family Medicine

## 2011-06-10 VITALS — BP 102/78 | HR 85 | Resp 16 | Ht 70.0 in | Wt 152.4 lb

## 2011-06-10 DIAGNOSIS — I10 Essential (primary) hypertension: Secondary | ICD-10-CM

## 2011-06-10 DIAGNOSIS — F1120 Opioid dependence, uncomplicated: Secondary | ICD-10-CM

## 2011-06-10 DIAGNOSIS — K219 Gastro-esophageal reflux disease without esophagitis: Secondary | ICD-10-CM | POA: Insufficient documentation

## 2011-06-10 DIAGNOSIS — F39 Unspecified mood [affective] disorder: Secondary | ICD-10-CM | POA: Insufficient documentation

## 2011-06-10 DIAGNOSIS — F329 Major depressive disorder, single episode, unspecified: Secondary | ICD-10-CM

## 2011-06-10 DIAGNOSIS — B1920 Unspecified viral hepatitis C without hepatic coma: Secondary | ICD-10-CM | POA: Insufficient documentation

## 2011-06-10 DIAGNOSIS — F3289 Other specified depressive episodes: Secondary | ICD-10-CM

## 2011-06-10 DIAGNOSIS — G8929 Other chronic pain: Secondary | ICD-10-CM | POA: Insufficient documentation

## 2011-06-10 HISTORY — DX: Opioid dependence, uncomplicated: F11.20

## 2011-06-10 MED ORDER — CLONIDINE HCL 0.1 MG PO TABS: 0.1000 mg | ORAL_TABLET | Freq: Two times a day (BID) | ORAL | Status: DC

## 2011-06-10 NOTE — Assessment & Plan Note (Signed)
Patient will continue to follow psychiatry. His medications were updated today. He is now on Prozac as well as Seroquel. I will obtain records from his recent rehabilitation and hospital stay. His mood was actually better than previous visits.

## 2011-06-10 NOTE — Progress Notes (Signed)
Subjective:    Patient ID: Marc [name], male    DOB: Feb 14, 1956, 55 y.o.   MRN: [id number]  HPI  Marc [name] presents to followup from a [facility] stay [facility]. Marc [name] himself into rehabilitation on May 07, 2011. At that time he was having withdrawals from using narcotics. He admits to buying Vicodin and Percocet from friends and off the streets. He was using approximately 6-10 tablets daily. He was also taking his Klonopin which is medically prescribed by his neurologist daily. He was on Soma for some time however quit during this time of withdrawal. [facility] when he began to have chest pain and discomfort. He attributes this to the use of HCTZ a new blood pressure medication started by one of the physicians at the Center. His rehabilitation program consisted of use of methadone to get him off narcotics. He is currently not on any narcotic medication and is not taking Ultram which was previously prescribed by his orthopedic surgeon.  During his rehabilitation stay as noted above he didn't have elevated blood pressures. He was on amlodipine previously by his primary care provider. He states his blood pressures were so elevated that they started clonidine and HCTZ. He was maintained on clonidine and HCTZ throughout his hospital stay. He was evaluated for chest pain which he states was a combination of increased Prozac dose and HCTZ. He had a nuclear stress test, EKG, laboratory work which were all negative per report. There no records at this time. After he was cleared medically he was sent back to rehabilitation for another 2 weeks and was discharged on June 08, 2011. He continues to have a feeling of nervousness and anxiety. His sleep is slowly improving but that continues to be a problem. He has not had any further chest pain, shortness of breath.  Hypertension-  per above patient had elevated blood pressures. His current medications include clonidine 0.1 mg twice a day and Norvasc 5 mg daily. He states he's been taking a half a clonidine a couple of days because they did not give her a prescription before he left. He's not sure clonidine is being used as a psychotropic medication. Note patient stopped HCTZ himself this was not discontinued by any physician.  Depression- patient has a long-standing history of depression. He has been establish with the aftercare program in [facility]. He will have to go to classes 3 times a week to meet with bruits, counselor and psychiatrist. Of note he was also started on Seroquel during the end of his hospitalization. His Prozac was decreased back to 20 mg which he was on prior to entering rehabilitation.  Chronic pain- he is currently not on any narcotic medications. He has tapered himself off of soma which is a muscle relaxant he has been on for some time. He was started on Robaxin by the facility.   Patient sees multiple providers. He has to reschedule appointment with Dr. Gerilyn Pilgrim to become his new neurologist. He also had needs to reschedule his followup appointment with his gastroenterologist he has an endoscopy pending. His orthopedic doctor is Dr. Eulah Pont in [facility]   Review of Systems     GEN- +fatigue, fever, weight loss,weakness, recent illness CVS- denies chest pain, palpitations RESP- denies SOB, cough, wheeze ABD- denies N/V, change in stools, abd pain GU- denies dysuria, hematuria, dribbling, incontinence MSK- + joint pain, muscle aches,  injury Neuro- denies headache, dizziness, syncope, seizure activity      Objective:   Physical Exam GEN- NAD, alert and oriented x3 HEENT- PERRL, EOMI, non injected sclera, pink conjunctiva, MMM, oropharynx clear Neck- Supple, no thryomegaly CVS- RRR, no murmur RESP-CTAB EXT- No edema Pulses- Radial, DP- 2+ Psych- improved affect-  still depressed appearing, not overly anxious, no apparent SI, no hallucinations, mentation appropriate       Assessment & Plan:

## 2011-06-10 NOTE — Assessment & Plan Note (Signed)
I commended patient on seeking help for his narcotic addiction. He was not prescribed any pain medication at this time. He is to followup with psychiatry as the plan is already in place. He will continue the current medications.

## 2011-06-10 NOTE — Patient Instructions (Signed)
I will get a copy of your hospital stay You can bring by a copy for your notes as well F/u with psych and after care program Reschedule with Dr. Gerilyn Pilgrim neurology Continue your current medications  Schedule a nurse visit in 2 weeks for a recheck on your blood pressure Reschedule your GI appt  F/U appt in 6 weeks with Dr. Lodema Hong

## 2011-06-10 NOTE — Assessment & Plan Note (Signed)
Patient's blood pressure was previously fairly well controlled. He will continue Norvasc and clonidine at this time to time not sure if they're using clonidine to help with insomnia as well. His blood pressure was low normal tensive today. He will follow up in 2 weeks for blood pressure recheck with the nurse. If he is having hypotension would discontinue the clonidine.

## 2011-06-11 ENCOUNTER — Telehealth: Payer: Self-pay | Admitting: Family Medicine

## 2011-06-11 ENCOUNTER — Other Ambulatory Visit: Payer: Self-pay | Admitting: Family Medicine

## 2011-06-11 NOTE — Telephone Encounter (Signed)
Med will be called in

## 2011-06-12 ENCOUNTER — Other Ambulatory Visit (HOSPITAL_COMMUNITY): Payer: PRIVATE HEALTH INSURANCE | Attending: Physician Assistant

## 2011-06-12 DIAGNOSIS — G8929 Other chronic pain: Secondary | ICD-10-CM | POA: Insufficient documentation

## 2011-06-12 DIAGNOSIS — F39 Unspecified mood [affective] disorder: Secondary | ICD-10-CM | POA: Insufficient documentation

## 2011-06-12 DIAGNOSIS — K219 Gastro-esophageal reflux disease without esophagitis: Secondary | ICD-10-CM | POA: Insufficient documentation

## 2011-06-12 DIAGNOSIS — I10 Essential (primary) hypertension: Secondary | ICD-10-CM | POA: Insufficient documentation

## 2011-06-12 DIAGNOSIS — B1920 Unspecified viral hepatitis C without hepatic coma: Secondary | ICD-10-CM | POA: Insufficient documentation

## 2011-06-12 NOTE — Telephone Encounter (Signed)
Left patient a message.

## 2011-06-15 ENCOUNTER — Encounter: Payer: Self-pay | Admitting: Family Medicine

## 2011-06-15 ENCOUNTER — Other Ambulatory Visit (HOSPITAL_COMMUNITY): Payer: PRIVATE HEALTH INSURANCE | Admitting: Psychology

## 2011-06-15 NOTE — Progress Notes (Signed)
  Subjective:    Patient ID: Marc [name], male    DOB: [birth year], 55 y.o.   MRN: [id number]  HPI    Review of Systems     Objective:   Physical Exam        Assessment & Plan:  Based on patient's previous history where he was admitted for chest pain workup. Lipid profile, metabolic profile and CBC is scanned into chart. Stress test and echocardiogram were also given to the history section. These occurred in Oct 2012

## 2011-06-16 NOTE — Progress Notes (Signed)
    Daily Group Progress Note  Program: CD-IOP   Group Time: 1 - 2:30 pm  Participation Level: Active  Behavioral Response: Appropriate, Sharing and Assertive  Type of Therapy: Psycho-education Group  Topic: Self-Esteem; a presentation was provided on self-esteem and the ways in which one can improve and strengthen their self esteem. A handout was included and group members reviewed different ways to improve or negative one's sense of self. The importance of identifying goals and then having something to work for was discussed. A majority of the group were unable to identify their goals.      Group Time: 2:45-4 pm  Participation Level: Active  Behavioral Response: Appropriate, Sharing and Assertive  Type of Therapy: Process Group  Topic: Process Group; Second half of group was spent in process. Members shared about their current stressors, issues and concerns. One member had used since the last session and her drinking was discussed and evaluated. There was good disclosure and feedback among group members.    Summary: The patient was engaged and shared openly in the session. Marc [name] was able to identify his values and goals and noted that he worked a strong daily recovery plan which enhanced his sense of self. He provided good encouragement to his fellow group members and shared about the importance of having reconnected with his father before he passed away. The patient made some good comments and responded well to this intervention.    Family Program: Family present? No   Name of family member(s):   UDS collected: No Results: negative  AA/NA attended?: Yes  Sponsor?: Yes   Katleen Carraway, LCAS

## 2011-06-16 NOTE — Progress Notes (Unsigned)
    Daily Group Progress Note  Program: {CHL AMB BH IOP/CDIOP Program Type:21022744}   Group Time: ***  Participation Level: {CHL AMB BH Group Participation:21022742}  Behavioral Response: {CHL AMB BH Group Behavior:21022743}  Type of Therapy: {CHL AMB BH Type of Therapy:21022741}  Topic: ***     Group Time: ***  Participation Level: {CHL AMB BH Group Participation:21022742}  Behavioral Response: {CHL AMB BH Group Behavior:21022743}  Type of Therapy: {CHL AMB BH Type of Therapy:21022741}  Topic: ***   Summary: ***   Family Program: Family present? {BHH YES OR NO:22294}   Name of family member(s): ***  UDS collected: {BHH YES OR NO:22294} Results: {Findings; urine drug screen:60936}  AA/NA attended?: {BHH YES OR NO:22294}{DAYS OF ZOXW:96045}  Sponsor?: {BHH YES OR NO:22294}   Chaos Carlile, LCAS          Daily Group Progress Note  Program: CD-IOP   Group Time: 1 - 2:30 pm  Participation Level: Active  Behavioral Response: Appropriate, Sharing and Assertive  Type of Therapy: Psycho-education Group  Topic: Self-Esteem; a presentation was provided on self-esteem and the ways in which one can improve and strengthen their self esteem. A handout was included and group members reviewed different ways to improve or negative one's sense of self. The importance of identifying goals and then having something to work for was discussed. A majority of the group were unable to identify their goals.      Group Time: 2:45-4 pm  Participation Level: Active  Behavioral Response: Appropriate, Sharing and Assertive  Type of Therapy: Process Group  Topic: Process Group; Second half of group was spent in process. Members shared about their current stressors, issues and concerns. One member had used since the last session and her drinking was discussed and evaluated. There was good disclosure and feedback among group members.    Summary: The patient was engaged  and shared openly in the session. Marc [name] was able to identify his values and goals and noted that he worked a strong daily recovery plan which enhanced his sense of self. He provided good encouragement to his fellow group members and shared about the importance of having reconnected with his father before he passed away. The patient made some good comments and responded well to this intervention.    Family Program: Family present? No   Name of family member(s):   UDS collected: No Results: negative  AA/NA attended?: Yes  Sponsor?: Yes   Takina Busser, LCAS

## 2011-06-17 ENCOUNTER — Other Ambulatory Visit (HOSPITAL_COMMUNITY): Payer: PRIVATE HEALTH INSURANCE | Admitting: Psychology

## 2011-06-18 ENCOUNTER — Other Ambulatory Visit (HOSPITAL_COMMUNITY): Payer: Self-pay | Admitting: *Deleted

## 2011-06-18 DIAGNOSIS — F1020 Alcohol dependence, uncomplicated: Secondary | ICD-10-CM

## 2011-06-18 NOTE — Progress Notes (Signed)
    Daily Group Progress Note  Program: CD-IOP   Group Time: 1-2:30 pm  Participation Level: Active  Behavioral Response: Appropriate, Sharing and Assertive  Type of Therapy: Psycho-education Group  Topic: The Disease Concept of Addiction; A presentation was provided on the chronic nature of addiction. Emphasis was placed on "managing" the disease through a daily program. The importance of routine in observing the daily schedule was reiterated. The session demonstrated that living a quality life through daily management of symptoms is very possible. There was good disclosure among group members and many agreed that complacency had brought about their relapses in the past.        Group Time: 2:45-4 pm  Participation Level: Active  Behavioral Response: Appropriate, Sharing  Type of Therapy: Process Group  Topic: Group Process and Graduation; Second half of group spent in process. Members shared their feelings about current struggles and issues they are currently dealing with. At the conclusion of group, a graduation ceremony was held for a member successfully graduating from the program.   Summary: The patient talked about the importance of the 12-step community and reaching out to others. He noted that he is very engaged and active in his recovery and encouraged other members to attend meetings and work on their gratitude. He made some good comments.    Family Program: Family present? No   Name of family member(s)   UDS collected: No Results: negative  AA/NA attended?: YesMonday, Tuesday, Wednesday, Thursday, Friday, Saturday and Sunday  Sponsor?: Yes   Bianna Haran, LCAS

## 2011-06-19 ENCOUNTER — Other Ambulatory Visit (HOSPITAL_COMMUNITY): Payer: PRIVATE HEALTH INSURANCE | Admitting: Psychology

## 2011-06-19 DIAGNOSIS — F1020 Alcohol dependence, uncomplicated: Secondary | ICD-10-CM

## 2011-06-22 ENCOUNTER — Other Ambulatory Visit (HOSPITAL_COMMUNITY): Payer: PRIVATE HEALTH INSURANCE

## 2011-06-22 NOTE — Progress Notes (Signed)
    Daily Group Progress Note  Program: CD-IOP   Group Time: 1-2:30 pm  Participation Level: Active  Behavioral Response: Appropriate, Sharing and Assertive  Type of Therapy: Psycho-education Group  Topic: The Wheel of Life; an exercise and presentation on the Wheel of Life. A wheel was drawn on the board divided into 8 different segments representing the various aspects of one's life. Members were given a handout and asked to chart where they are relative to each segment in their current lives. By connecting the 8 segments, one is able to see how "smoothly" their wheel would roll. The bumpier one's ride, the more inconsistent one is doing in the various segments. Each member was invited to come to the board, draw his or her wheel and explain why they were where they are in each segment.      Group Time: 2:45-4 pm  Participation Level: Active  Behavioral Response: Appropriate and Sharing  Type of Therapy: Process Group  Topic: Group Process: in the second half of group members shared about their feelings on current struggles and issues they may be dealing with. This invited good feedback and comments and all engaged actively in this session.    Summary: The patient drew his wheel on the board. He had a fairly smooth one - certainly one of the most balanced within the group. He explained that AA addresses a number of the pieces of his wheel. He talked about his means of recreation and noted that he has over 25 guitars. This brought up disclosure of guitar ownership by 2 other members. Bill shared about having had contact with his son, but now the son hasn't responded to his texts and emails. Bill noted that his son had abused stimulants at one time, but had gotten himself off those chemicals. The patient made some good comments and provided good support and feedback to his fellow group members.     Family Program: Family present? No   Name of family member(s):   UDS collected: No  Results: negative  AA/NA attended?: YesMonday, Tuesday, Wednesday, Thursday, Friday, Saturday and Sunday  Sponsor?: Yes   Colston Pyle, LCAS

## 2011-06-23 ENCOUNTER — Other Ambulatory Visit: Payer: Self-pay | Admitting: Family Medicine

## 2011-06-23 ENCOUNTER — Telehealth: Payer: Self-pay | Admitting: Family Medicine

## 2011-06-23 MED ORDER — METHOCARBAMOL 750 MG PO TABS: 750.0000 mg | ORAL_TABLET | Freq: Three times a day (TID) | ORAL | Status: AC

## 2011-06-23 NOTE — Telephone Encounter (Signed)
Came into office wanting a refill of methocarbamol 750mg  1 tab every 8 hrs as needed. Last filled by [facility] in [place]. Can patient have a refill on this med?

## 2011-06-23 NOTE — Telephone Encounter (Signed)
Med has been sent to the pharmacy on file for him, pls let him know

## 2011-06-24 ENCOUNTER — Other Ambulatory Visit (HOSPITAL_COMMUNITY): Payer: PRIVATE HEALTH INSURANCE | Admitting: Psychology

## 2011-06-24 NOTE — Telephone Encounter (Signed)
Left message for the patient that medicine is at pharmacy

## 2011-06-25 ENCOUNTER — Ambulatory Visit (INDEPENDENT_AMBULATORY_CARE_PROVIDER_SITE_OTHER): Payer: PRIVATE HEALTH INSURANCE

## 2011-06-25 VITALS — BP 110/72 | Wt 156.0 lb

## 2011-06-25 DIAGNOSIS — I10 Essential (primary) hypertension: Secondary | ICD-10-CM

## 2011-06-25 NOTE — Progress Notes (Signed)
    Daily Group Progress Note  Program: CD-IOP   Group Time: 1-2:30 pm  Participation Level: Active  Behavioral Response: Appropriate, Sharing and Assertive  Type of Therapy: Psycho-education Group  Topic: PAWS -  Post Acute Withdrawal Symptoms. A presentation and handout was provided on PAWS. These are the very common and frequently experienced symptoms in early recovery. They include memory loss, irritability, insomnia, clumsiness and a number of other impairments that are typically experienced in early recovery. The importance of being patient with one's self and realizing that there is nothing wrong is very critical. Members shared their own particular experiences with PAWS and there was not one group member who had not experienced at least 1 of these symptoms.      Group Time: 2:45-4 pm  Participation Level: Active  Behavioral Response: Sharing  Type of Therapy: Process Group  Topic: Process Group and Graduation   Summary: the second half of group was spent in process. Member shared their feelings about current struggles and issues they are dealing with. As the group neared the end of session, a graduation ceremony was held for a successfully graduating group member.    Family Program: Family present? No   Name of family member(s):   UDS collected: No Results:  AA/NA attended?: YesMonday, Tuesday, Wednesday, Thursday, Friday, Saturday and Sunday  Sponsor?: Yes   Carlisha Wisler, LCAS

## 2011-06-25 NOTE — Progress Notes (Signed)
Met with the patient before his CD-IOP group session to identify his goals of treatment. He agreed that sobriety and developing a strong support network in the 12-step community were both very important goals.

## 2011-06-26 ENCOUNTER — Other Ambulatory Visit (HOSPITAL_COMMUNITY): Payer: PRIVATE HEALTH INSURANCE | Admitting: Psychology

## 2011-06-26 NOTE — Progress Notes (Signed)
BP was 110/72 and it was doing well and told to keep his next appt

## 2011-06-29 ENCOUNTER — Other Ambulatory Visit (HOSPITAL_COMMUNITY): Payer: PRIVATE HEALTH INSURANCE | Admitting: Psychology

## 2011-06-29 NOTE — Progress Notes (Signed)
    Daily Group Progress Note  Program: CD-IOP   Group Time: 1-2:30 pm  Participation Level: Active  Behavioral Response: Appropriate and Sharing  Type of Therapy: Activity Group  Topic: Gentle Yoga: A certified yoga instructor visited the group this afternoon. She led the group om yoga stretches and poses. The group responded well and concurred that they felt relaxed and more open at the conclusion of the session. The purpose of this session was to demonstrate the way one can change their feelings quickly and in a healthy manner. There was good feedback and the group responded well to this intervention.      Group Time: 2:45- 4 pm  Participation Level: Active  Behavioral Response: Sharing  Type of Therapy: Process Group  Topic: Process Group; second half of session was spent in process. Members shared about their current feelings and experiences in early recovery. There was good disclosure and sharing among the group and they discussed plans for the coming weekend.    Summary: The patient was attentive and reported he has been feeling very irritable and discontent, but phoned his sponsor and spoke at length about his thoughts of using. I wondered if perhaps his ill feelings were due to the recent anniversary of his mother's death? In addition, his entire family is deceased now and I questioned whether the upcoming holidays will be challenging for him? He agreed that those issues could certainly be contributing to his discomfort. The patient has some kind words to another member who is struggling in very early recovery and he encouraged her to continue talking about how she is feeling and not keeping any secrets. The patient reported he will be very active in meetings over the weekend, talk with his sponsor and attend bible study. He made some good comments and continues to emphasize the importance of the 12-step community in his daily life.   Family Program: Family present? No    Name of family member(s):   UDS collected: No Results:   AA/NA attended?: YesMonday, Tuesday, Wednesday, Thursday, Friday, Saturday and Sunday  Sponsor?: Yes   Kabe Mckoy, LCAS

## 2011-06-30 NOTE — Progress Notes (Signed)
    Daily Group Progress Note  Program: CD-IOP   Group Time: 1-2:30 pm  Participation Level: Active  Behavioral Response: Appropriate and Sharing  Type of Therapy: Psycho-education Group  Topic: Values: A presentation was made on Values and the way one's addiction "hijacks" one's values. Group members were provided with a handout listing many different values. They were asked to identify their top 3. Members then shared some of the values they had identified. A discussion ensued about the ways in which members abandoned their values while in their active addiction. There was good disclosure and members were very candid about the things they had done in their active addiction that conflicted entirely with what they value.      Group Time: 2:45-4pm  Participation Level: Active  Behavioral Response: Sharing and Assertive  Type of Therapy: Process Group  Topic: Process Group; second half of group was spent in process. Members shared their feelings about current issues in early recovery. A number of members complained about emotional lability and sleeplessness. One member brought his S/O and she made some excellent comments about how his drinking had impacted her.    Summary: The patient reported he had had a good weekend. He offered feedback to another member and explained why one needed a sponsor in order to complete Step work. The patient displayed a good understanding of the 12-step program and encouraged others to secure a sponsor before starting to work the steps. He noted that he is still on Step 1. Another member asked him about remaining in the program. The patient admitted he is considering quitting. He pointed out the long hours this program requires and he maintained that he has not learned anything. I challenged him to open up and share his feelings with the group. Nothing will change for him unless and until he is willing to share about his deepest pain and sorrow. The patient  explained that he isn't the type to open up and just offer that sort of thing. I suggested that perhaps that is the reason he is here to begin with and encouraged him to reconsider how he interacts in the group.    Family Program: Family present? No   Name of family member(s):   UDS collected: No Results:   AA/NA attended?: YesMonday, Tuesday, Wednesday, Thursday, Friday and Saturday  Sponsor?: Yes   Tahiry Spicer, LCAS

## 2011-07-01 ENCOUNTER — Other Ambulatory Visit (HOSPITAL_COMMUNITY): Payer: PRIVATE HEALTH INSURANCE | Admitting: Psychology

## 2011-07-03 ENCOUNTER — Other Ambulatory Visit (HOSPITAL_COMMUNITY): Payer: PRIVATE HEALTH INSURANCE

## 2011-07-06 ENCOUNTER — Other Ambulatory Visit (HOSPITAL_COMMUNITY): Payer: PRIVATE HEALTH INSURANCE | Admitting: Psychology

## 2011-07-06 ENCOUNTER — Other Ambulatory Visit (HOSPITAL_COMMUNITY): Payer: Self-pay | Admitting: *Deleted

## 2011-07-06 DIAGNOSIS — F1920 Other psychoactive substance dependence, uncomplicated: Secondary | ICD-10-CM

## 2011-07-06 NOTE — Progress Notes (Signed)
    Daily Group Progress Note  Program: CD-IOP   Group Time: 1-2:30 pm  Participation Level: Active  Behavioral Response: Appropriate and Sharing  Type of Therapy: Psycho-education Group  Topic: Communication: A presentation was provided on different types of communication styles. A handout was provided with examples of assertive, passive, aggressive and passive-aggressive communication styles. A scenario was read and members acted out 3 different role plays demonstrating the different ways they could communicate. There was good discussion and members identified their typical communication styles, most of which were passive and passive-aggressive. There was good discussion with examples provided and the group reflecting back on their perspectives and experiences.      Group Time: 2:45-4 pm  Participation Level: Active  Behavioral Response: Appropriate, Sharing and Assertive  Type of Therapy: Process Group  Topic: Process group; second half of group spent in process. Members shared their feelings and frustrations in early recovery. There was good discussion about plans for the upcoming holiday and preparations that will support ongoing recovery.    Summary:The patient was active in the discussion on communication styles. 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 He agreed that this has enhanced his recovery and has made life much more pleasant and easier. He volunteered to role-play one of the scenarios and handled the role quite well. Annette Stable reported he had received a text from his son stating that he would like to spend thanksgiving with his father. Annette Stable seemed very pleased by this and agreed he had a lot to be grateful for. He reported he would have the dinner out with his son and some of the members of his Bible Study. The patient made some good comments and responded well to this intervention.      Family Program: Family present? No   Name of family member(s):   UDS collected: No Results:   AA/NA attended?: YesMonday, Tuesday, Wednesday, Thursday, Friday, Saturday and Sunday  Sponsor?: Yes   Genevieve Arbaugh, LCAS

## 2011-07-07 NOTE — Progress Notes (Signed)
    Daily Group Progress Note  Program: CD-IOP   Group Time: 1-2:30 pm  Participation Level: Active  Behavioral Response: Appropriate and Sharing  Type of Therapy: Process Group  Topic: Group Process; first half of group was spent in process. Members shared about their holiday weekends and the numerous ways they spent their long holiday weekend. Three members admitted using over the weekend and their "slips" were processed and discussed at length. The contrast in motivation and intention was quite apparent among the group members and while some members attended many meetings over the weekend, others did not go to any meetings or seek out support for their recovery.      Group Time: 2:45-4 pm  Participation Level: Minimal  Behavioral Response: Appropriate  Type of Therapy: Process Group  Topic:The Neurobiology of Addiction, Part 1. The second half of group was spent watching the DVD, "The Neurobiology of Addiction". It presented a very informative presentation on the brain chemistry of addiction and the chemicals that chemically dependent people are missing or experience reduced levels of. The film was halted numerous times during the presentation and the concepts discussed thoroughly to insure that group members clearly understood the chemical nature of their addiction.     Summary: The patient reported he had had a nice weekend and ate Thanksgiving dinner with his son. The group applauded this news and the patient agreed that it was really nice to be with him. The patient shared some of his insights gained through the 12-step community and made comments about various perspectives within the 12-step community. He agreed with another member about how far one will go in their active addiction and noted that he had pawned many a guitar when he was using. The patient made some good comments and responded well to this intervention.    Family Program: Family present? No   Name of family  member(s):   UDS collected: Yes Results:   AA/NA attended?: YesMonday, Tuesday, Wednesday, Thursday, Friday, Saturday and Sunday  Sponsor?: Yes   Rip Hawes, LCAS

## 2011-07-08 ENCOUNTER — Other Ambulatory Visit (HOSPITAL_COMMUNITY): Payer: PRIVATE HEALTH INSURANCE | Admitting: Psychology

## 2011-07-09 NOTE — Progress Notes (Signed)
    Daily Group Progress Note  Program: CD-IOP   Group Time: 1-2:30 pm  Participation Level: Minimal  Behavioral Response: Appropriate  Type of Therapy: Psycho-education Group  Topic :Pharmacist: The first half of group was spent with the [facility] Pharmacist, Peggye Fothergill. She engaged the group in a discussion on different medications for various illnesses and the specific issues and complications most typical in the chemically dependent population. Group members asked many questions, especially as relates to mood disorders and sleeplessness. There was good interaction and exchange and the session went well.       Group Time: 2:45-4 pm  Participation Level: Active  Behavioral Response: Appropriate and Sharing  Type of Therapy: Process Group  Topic: Group Process and Graduation: second half of group spent in process. There were 2 new group members present and they shared about their lives in addiction and what they are wanting from the group. Near the conclusion of the session, a graduation ceremony was held for a graduating member.    Summary: The patient was active in the process group. He discussed the importance of the 12-step community in recovery. He was able to relate to one of the new group member's who had witnessed his sponsor relapse. The patient agreed that one must keep in mind that one's sponsor is human and sponsors mess up too. He admitted that he had held his sponsor on a pedestal and when the sponsor relapsed, it proved devastating. The patient made some good comments and responded well to this intervention.    Family Program: Family present? No   Name of family member(s):   UDS collected: Yes Results:   AA/NA attended?: YesMonday, Tuesday, Wednesday, Thursday, Friday, Saturday and Sunday  Sponsor?: Yes   Harshika Mago, LCAS

## 2011-07-10 ENCOUNTER — Other Ambulatory Visit (HOSPITAL_COMMUNITY): Payer: PRIVATE HEALTH INSURANCE | Admitting: Psychology

## 2011-07-13 ENCOUNTER — Other Ambulatory Visit (HOSPITAL_COMMUNITY): Payer: PRIVATE HEALTH INSURANCE | Attending: Physician Assistant | Admitting: Psychology

## 2011-07-13 ENCOUNTER — Other Ambulatory Visit (HOSPITAL_COMMUNITY): Payer: Self-pay | Admitting: *Deleted

## 2011-07-13 DIAGNOSIS — F39 Unspecified mood [affective] disorder: Secondary | ICD-10-CM | POA: Insufficient documentation

## 2011-07-13 DIAGNOSIS — K219 Gastro-esophageal reflux disease without esophagitis: Secondary | ICD-10-CM | POA: Insufficient documentation

## 2011-07-13 DIAGNOSIS — I10 Essential (primary) hypertension: Secondary | ICD-10-CM | POA: Insufficient documentation

## 2011-07-13 DIAGNOSIS — F1920 Other psychoactive substance dependence, uncomplicated: Secondary | ICD-10-CM | POA: Insufficient documentation

## 2011-07-13 DIAGNOSIS — G8929 Other chronic pain: Secondary | ICD-10-CM | POA: Insufficient documentation

## 2011-07-13 DIAGNOSIS — B1920 Unspecified viral hepatitis C without hepatic coma: Secondary | ICD-10-CM | POA: Insufficient documentation

## 2011-07-13 NOTE — Progress Notes (Signed)
    Daily Group Progress Note  Program: CD-IOP   Group Time: 1-3:30 pm  Participation Level: Active  Behavioral Response: Appropriate  Type of Therapy: Activity Group  Topic:The session today was spent watching the movie, "Flight", with [employer], in a local theatre. 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 The film and main actor clearly displayed the narcissistic thinking and behavior of the addict. It also showed the consequences of continued alcohol and drug use. This session represented an opportunity to educate patients more about addiction, but also about opportunities to have fun and enjoy one's self living sober.       Group Time: 3:30 - 4 pm  Participation Level: Active  Behavioral Response: Appropriate and Sharing  Type of Therapy: Process Group  Topic: At the conclusion of the film, group members and therapists gathered in a private room to discuss the film. Members shared their feelings about the film and identified triggers or other instances where they experienced powerful emotions. The discussion included talking about ways to deal with cravings and the importance of "playing the tape out to the end". The session today proved enlightening for the group and an opportunity not experienced in many years for a number of group members.   Summary: the patient reported he enjoyed the film. He was able to relate to the main character while he was drinking. He also found the relationships with the ex-wife and 64 yo son very compelling since his addiction distanced him from his only child. The patient made some good comments and responded well to this intervention.     Family Program: Family present? No   Name of family member(s):   UDS collected: No Results:   AA/NA attended?: Yes  Sponsor?: Yes   Alvita Fana, LCAS

## 2011-07-14 LAB — DRUG SCREEN, URINE
Benzodiazepines.: NEGATIVE
Creatinine,U: 58.9 mg/dL
Opiates: NEGATIVE
Phencyclidine (PCP): NEGATIVE
Propoxyphene: NEGATIVE

## 2011-07-14 NOTE — Progress Notes (Signed)
    Daily Group Progress Note  Program: CD-IOP   Group Time: 1-2:30 pm  Participation Level: Active  Behavioral Response: Appropriate and Sharing  Type of Therapy: Process Group  Topic: Group Process: the first half of group today was spent in process. Members checked in and shared their experiences and feelings since they were last in group. One member admitted that he had relapsed over the weekend and a significant amount of time was spent processing the relapse. The group agreed that one must have a plan for the day and in some instances, make a commitment to another in recovery to insure they are following the plan. There was good disclosure and discussion among group members.      Group Time: 2:45 - 4 pm  Participation Level: Active  Behavioral Response: Appropriate  Type of Therapy: Psycho-education Group  Topic: Cognitive Distortions: second half of group was spent identifying and discussing cognitive distortions. The idea that a thought comes before a feeling or emotion was emphasized and members discussed specific scenarios in their past trying to identify the distortion that they hold true and how it affects their feelings and subsequent behaviors.  A handout was provided with 10 very common distortions and the members provided examples of these distortions in their own lives.      Summary: The patient was attentive and discussed the fellow group member's relapse over the weekend. He agreed that one had to be very regimented and keep their schedule full and be accountable. He reminded the group of the importance of honesty and being engaged in the 12-step program. During the second half of group, he talked about resentments and admitted that he had issues he needed to examine. The patient remains abstinent and continues to make excellent progress in his recovery. He made some good comments and provided support to his fellow group members.    Family Program: Family  present? No   Name of family member(s):   UDS collected: Yes Results:  AA/NA attended?: YesMonday, Tuesday, Wednesday, Thursday, Friday, Saturday and Sunday  Sponsor?: Yes   Rami Waddle, LCAS

## 2011-07-15 ENCOUNTER — Other Ambulatory Visit (HOSPITAL_COMMUNITY): Payer: PRIVATE HEALTH INSURANCE | Admitting: Psychology

## 2011-07-15 ENCOUNTER — Encounter (HOSPITAL_COMMUNITY): Payer: Self-pay | Admitting: *Deleted

## 2011-07-16 NOTE — Progress Notes (Signed)
    Daily Group Progress Note  Program: CD-IOP   Group Time: 1-2:30 pm  Participation Level: Active  Behavioral Response: Appropriate and Sharing  Type of Therapy: Psycho-education Group  Topic:The Five Love Languages:  A presentation was provided on the [employer]. A questionnaire was handed out and members were asked to complete the 30 questions on the handout. Upon completion, members shared their totals and discussed which of the Five Languages, the felt most comfortable with and validated. There was good discussion and disclosure among the group. The importance of knowing how one feels best or most loved was emphasized. Members were also encouraged to insure that their S/O is aware of their feelings so they can be validated in the manner or language that they most respond to.       Group Time: 2:45-4 pm  Participation Level: Active  Behavioral Response: Sharing  Type of Therapy: Process Group  Topic: Group Process: Second half of group was spent in process. Members shared their current struggles and concerns. Some members noted that they had been paying more attention to their thoughts and beliefs and were trying to better understand their feelings. This heralded back to a previous session focusing on how one's thoughts produce feelings. There was good disclosure and feedback provided by group members.   Summary: The patient reported he was felt most loved by quality time. He shared a little about his 2 previous marriages and the problems he had had in each of them. When asked about the future, he insisted that he is still optimistic and hopeful about being in relationship but admitted he is focusing on today and his recovery. The patient reported that he attends NA and finds the support and validation from his fellow addicts very helpful. The patient continues to make good progress in his recovery and is very active in the 12-step community.    Family Program: Family  present? No   Name of family member(s):   UDS collected: No Results:   AA/NA attended?: YesMonday, Tuesday, Wednesday, Thursday, Friday, Saturday and Sunday  Sponsor?: Yes   Vinia Jemmott, LCAS

## 2011-07-17 ENCOUNTER — Encounter: Payer: Self-pay | Admitting: Family Medicine

## 2011-07-17 ENCOUNTER — Other Ambulatory Visit (HOSPITAL_COMMUNITY): Payer: PRIVATE HEALTH INSURANCE | Admitting: Psychology

## 2011-07-17 DIAGNOSIS — F39 Unspecified mood [affective] disorder: Secondary | ICD-10-CM

## 2011-07-17 MED ORDER — QUETIAPINE FUMARATE 50 MG PO TABS: ORAL_TABLET | ORAL | Status: DC

## 2011-07-20 ENCOUNTER — Other Ambulatory Visit (HOSPITAL_COMMUNITY): Payer: PRIVATE HEALTH INSURANCE | Admitting: Psychology

## 2011-07-20 DIAGNOSIS — F1920 Other psychoactive substance dependence, uncomplicated: Secondary | ICD-10-CM

## 2011-07-20 NOTE — Progress Notes (Signed)
    Daily Group Progress Note  Program: CD-IOP   Group Time: 1-2:30 pm  Participation Level: Active  Behavioral Response: Sharing  Type of Therapy: Psycho-education Group  Topic: Sleep Hygiene and Self Care: A presentation was provided on the topic of Sleep Hygiene.  A handout was given to all members identifying the 5 stages of sleep and detailed descriptions of each stage of sleep. The discussion went on to discuss general concepts around self-care and the various elements of recovery. There was good discussion about personal issues around sleep and the ways one's active addiction impacted their sleep. One member who had relapsed last weekend admitted his sleep is still very disrupted. There was good feedback and disclosure during this presentation.      Group Time: 2:45-4 pm  Participation Level: Active  Behavioral Response: Appropriate  Type of Therapy: Process Group  Topic: Group Process: Members discussed issues they are current dealing with in early recovery. Included in this part of group was a guided relaxation exercise. The purpose of this exercise was to emphasize the importance of group members staying balanced and focused, in part, through relaxation and meditation exercises. A new member was present and she introduced herself and answered numerous questions from fellow group members asking about her history of drugs and degree of acceptance of her chemical dependency.   Summary: The patient reported he is doing well and has been attending meetings and contacting his sponsor. [name] noted that when he is feeling irritability or signs of discontent, he calls his sponsor or another fellow in the program and feels much better afterwards. He reported that he is sleeping well, but he is taking seroquel to achieve this good sleep. The patient provided good feedback to his fellow group members and made some good comments.     Family Program: Family present? No   Name of family  member(s):   UDS collected: No Results:  AA/NA attended?: YesMonday, Tuesday, Wednesday, Thursday, Friday, Saturday and Sunday  Sponsor?: Yes   Kendrah Lovern, LCAS

## 2011-07-21 LAB — DRUG SCREEN, URINE
Barbiturate Quant, Ur: NEGATIVE
Benzodiazepines.: NEGATIVE
Cocaine Metabolites: NEGATIVE
Creatinine,U: 26.4 mg/dL

## 2011-07-21 NOTE — Progress Notes (Signed)
    Daily Group Progress Note  Program: CD-IOP   Group Time: 1-2:30 pm  Participation Level: Active  Behavioral Response: Appropriate and Sharing  Type of Therapy: Psycho-education Group  Topic: Chaplain: the first part of group was spent with a visiting chaplain, Leola Brazil. She introduced herself and group members introduced themselves. Elease Hashimoto read from a book she had brought. It focused on the pain that an oyster goes through to build a pearl. The overriding emphasis was on the suffering and how our losses and pain become a part of [name]. The holiday season and particular difficulties that Christmas represents was discussed. Members shared about their own losses and how they have been transformed by the pain and suffering.      Group Time: 2:45-4 pm  Participation Level: Active  Behavioral Response: Appropriate  Type of Therapy: Process Group  Topic: Group Process; second half of group was spent in process. Members discussed their feelings and current struggles. Some talked about how they are doing in recovery while one member recounted a very close call he had had over the weekend. There was good disclosure and feedback among the group.    Summary: The patient was attentive in the session with the chaplain and reported that he had lost all of his family members, with the last one being his mother who died 2 years ago. He admitted that he was using last Christmas so this would be the first Christmas he is sober and everyone is gone. He agreed with the chaplain that it was difficult and he could appreciate the story she had read. He emphasized the importance of the 12-step program and community in his recovery. He also noted that his weekly Bible Study is very important to him. In process, Annette Stable offered some good support to his fellow group member and encouraged another one regarding his home group. The patient made some good comments and responded well to this intervention.     Family Program: Family present? No   Name of family member(s):   UDS collected: Yes Results: {  AA/NA attended?: YesMonday, Tuesday, Wednesday, Thursday, Friday, Saturday and Sunday  Sponsor?: Yes   Torsten Weniger, LCAS

## 2011-07-22 ENCOUNTER — Other Ambulatory Visit (HOSPITAL_COMMUNITY): Payer: PRIVATE HEALTH INSURANCE | Admitting: Psychology

## 2011-07-23 ENCOUNTER — Ambulatory Visit (INDEPENDENT_AMBULATORY_CARE_PROVIDER_SITE_OTHER): Payer: PRIVATE HEALTH INSURANCE | Admitting: Family Medicine

## 2011-07-23 ENCOUNTER — Encounter: Payer: Self-pay | Admitting: Family Medicine

## 2011-07-23 VITALS — BP 120/66 | HR 89 | Resp 18 | Ht 70.0 in | Wt 167.0 lb

## 2011-07-23 DIAGNOSIS — R5381 Other malaise: Secondary | ICD-10-CM

## 2011-07-23 DIAGNOSIS — J0100 Acute maxillary sinusitis, unspecified: Secondary | ICD-10-CM

## 2011-07-23 DIAGNOSIS — F32A Depression, unspecified: Secondary | ICD-10-CM

## 2011-07-23 DIAGNOSIS — F329 Major depressive disorder, single episode, unspecified: Secondary | ICD-10-CM

## 2011-07-23 DIAGNOSIS — F1120 Opioid dependence, uncomplicated: Secondary | ICD-10-CM

## 2011-07-23 DIAGNOSIS — Z125 Encounter for screening for malignant neoplasm of prostate: Secondary | ICD-10-CM

## 2011-07-23 DIAGNOSIS — R5383 Other fatigue: Secondary | ICD-10-CM

## 2011-07-23 DIAGNOSIS — I10 Essential (primary) hypertension: Secondary | ICD-10-CM

## 2011-07-23 DIAGNOSIS — E785 Hyperlipidemia, unspecified: Secondary | ICD-10-CM

## 2011-07-23 MED ORDER — CLONIDINE HCL 0.1 MG PO TABS: 0.1000 mg | ORAL_TABLET | Freq: Two times a day (BID) | ORAL | Status: DC

## 2011-07-23 MED ORDER — AMLODIPINE BESYLATE 5 MG PO TABS: ORAL_TABLET | ORAL | Status: DC

## 2011-07-23 MED ORDER — SULFAMETHOXAZOLE-TRIMETHOPRIM 800-160 MG PO TABS: 1.0000 | ORAL_TABLET | Freq: Two times a day (BID) | ORAL | Status: AC

## 2011-07-23 NOTE — Progress Notes (Signed)
Subjective:     Patient ID: Marc [name], male   DOB: 31-Oct-1955, 55 y.o.   MRN: [id number]  HPI The PT is here for follow up and re-evaluation of chronic medical conditions, medication management and review of any available recent lab and radiology data.  Preventive health is updated, specifically  Cancer screening and Immunization.   Since I last saw him he has been in rehab for narcotic dependence and is much improved mentally. 5 day h/o facial pressure with green foul tasting nasal drainage and sore throat with intermittent chills   Review of Systems See HPI Denies chest congestion, productive cough or wheezing. Denies chest pains, palpitations and leg swelling Denies abdominal pain, nausea, vomiting,diarrhea or constipation.   Denies dysuria, frequency, hesitancy or incontinence. Denies joint pain, swelling and limitation in mobility. Denies headaches, seizures, numbness, or tingling. Denies uncontrolled depression, anxiety or insomnia. Denies skin break down or rash.        Objective:   Physical Exam Patient alert and oriented and in no cardiopulmonary distress.  HEENT: No facial asymmetry, EOMI, maxillary  sinus tenderness,  oropharynx pink and moist.  Neck supple no adenopathy.  Chest: Clear to auscultation bilaterally.  CVS: S1, S2 no murmurs, no S3.  ABD: Soft non tender. Bowel sounds normal.  Ext: No edema  MS: Adequate ROM spine, shoulders, hips and knees.  Skin: Intact, no ulcerations or rash noted.  Psych: Good eye contact,   Memory intact not anxious or depressed appearing.  CNS: CN 2-12 intact, power, tone and sensation normal throughout.     Assessment:        Plan:

## 2011-07-23 NOTE — Patient Instructions (Signed)
F/u in 4 months.  You are being treated for right maxillary sinusitis.antibiotics are sent to your pharmacy.  Your health has improved greatly, continue to do the good you are doing.   Fasting labs by 07/27/2011 please

## 2011-07-24 ENCOUNTER — Other Ambulatory Visit (HOSPITAL_COMMUNITY): Payer: PRIVATE HEALTH INSURANCE | Admitting: Psychology

## 2011-07-24 NOTE — Progress Notes (Signed)
    Daily Group Progress Note  Program: CD-IOP   Group Time: 1-2:30 pm  Participation Level: Active  Behavioral Response: Appropriate and Sharing  Type of Therapy: Psycho-education Group  Topic: The Pro's and Con's of Drug Use VS Sobriety First half of group was spent identifying the pros and cons of "using" versus abstinence. Members quickly identified many benefits derived through chemicals as well as many positive aspects of remaining alcohol and drug-free. One member pointed out that while the Pro's of Sobriety were very concrete, the Pro's of chemical use were more theoretical and not necessarily tangible and certainly not measurable. There were far more benefits of sobriety as opposed to those for continued use. There was good discussion during this session.     Group Time: 2:45-4 pm  Participation Level: Active  Behavioral Response: Appropriate  Type of Therapy: Process Group  Topic: Group Process and Graduation: second half of group spent in process. A new group member was present and she introduced herself and told her "story" in this part of group. She was very articulate and open about her life over the past 4 years and displayed a very good understanding of CD since returning from a month of inpatient at [place] in [place]. Near the conclusion of the session, a graduation ceremony was observed for a group member successfully completing the program. There were kind and hopeful thoughts and feelings expressed and tears of joy for this woman. The session concluded with members sharing their plans for the weekend and how they intended to remain alcohol and drug-free.    Summary:    Family Program: Family present? No   Name of family member(s):   UDS collected: No Results:   AA/NA attended?: YesMonday, Tuesday, Wednesday, Thursday, Friday, Saturday and Sunday  Sponsor?: Yes   Briyan Kleven, LCAS

## 2011-07-24 NOTE — Progress Notes (Signed)
    Daily Group Progress Note  Program: CD-IOP   Group Time: 1-2:30 pm  Participation Level: Active  Behavioral Response: Appropriate  Type of Therapy: Psycho-education Group  Topic: The Pro's and Con's of Drug Use VS Sobriety First half of group was spent identifying the pros and cons of "using" versus abstinence. Members quickly identified many benefits derived through chemicals as well as many positive aspects of remaining alcohol and drug-free. One member pointed out that while the Pro's of Sobriety were very concrete, the Pro's of chemical use were more theoretical and not necessarily tangible and certainly not measurable. There were far more benefits of sobriety as opposed to those for continued use. There was good discussion during this session.     Group Time: 2:45-4 pm  Participation Level: Active  Behavioral Response: Sharing  Type of Therapy: Process Group  Topic:Group Process and Graduation: second half of group spent in process. A new group member was present and she introduced herself and told her "story" in this part of group. She was very articulate and open about her life over the past 4 years and displayed a very good understanding of CD since returning from a month of inpatient at [place] in [place]. Near the conclusion of the session, a graduation ceremony was observed for a group member successfully completing the program. There were kind and hopeful thoughts and feelings expressed and tears of joy for this woman. The session concluded with members sharing their plans for the weekend and how they intended to remain alcohol and drug-free.     Summary: Patient was attentive and shared openly in group. He was able to identify many negatives that accompany the addictive lifestyle and few benefits. He was also able to identify the many costs or con's of addiction. The patient shared how he sometimes gets very irritable and benefits from walking with his dog or  talking to his sponsor. Bill emphasized the importance of reaching out to others and not isolating. The patient continues to make excellent progress in his recovery. He responded well to this intervention.   Family Program: Family present? No   Name of family member(s):   UDS collected: No Results:   AA/NA attended?: YesMonday, Tuesday, Wednesday, Thursday, Friday, Saturday and Sunday  Sponsor?: Yes   Nabria Nevin, LCAS

## 2011-07-24 NOTE — Progress Notes (Signed)
    Daily Group Progress Note  Program: CD-IOP   Group Time: 1-2:30 pm  Participation Level: Active  Behavioral Response: Appropriate  Type of Therapy: Psycho-education Group  Topic: The Pro's and Con's of Drug Use VS Sobriety First half of group was spent identifying the pros and cons of "using" versus abstinence. Members quickly identified many benefits derived through chemicals as well as many positive aspects of remaining alcohol and drug-free. One member pointed out that while the Pro's of Sobriety were very concrete, the Pro's of chemical use were more theoretical and not necessarily tangible and certainly not measurable. There were far more benefits of sobriety as opposed to those for continued use. There was good discussion during this session.     Group Time: 2:45- 4pm  Participation Level: Active  Behavioral Response: Sharing  Type of Therapy: Process Group  Topic: Group Process and Graduation: second half of group spent in process. A new group member was present and she introduced herself and told her "story" in this part of group. She was very articulate and open about her life over the past 4 years and displayed a very good understanding of CD since returning from a month of inpatient at [place] in [place]. Near the conclusion of the session, a graduation ceremony was observed for a group member successfully completing the program. There were kind and hopeful thoughts and feelings expressed and tears of joy for this woman. The session concluded with members sharing their plans for the weekend and how they intended to remain alcohol and drug-free.    Summary: The patient shared about his frustrations and bad attitude of late. Bill reported he can take his dog for a walk and feel better within about 30 minutes. He also feels much better after speaking with his sponsor. The patient emphasized the importance of reaching out to others and fighting the tendency to isolate.  The patient made some kind comments to the graduating member and encouraged her to remain true to her faith and her Higher Power. He made some good comments and continues to emphasize the importance of the 12-step community in recovery.    Family Program: Family present? No   Name of family member(s):   UDS collected: No Results:  AA/NA attended?: YesMonday, Tuesday, Wednesday, Thursday, Friday, Saturday and Sunday  Sponsor?: Yes   Evy Lutterman, LCAS

## 2011-07-26 NOTE — Assessment & Plan Note (Signed)
Improved, reports he voluntarily went for in patient treatment, and is very involved in out pt rehab regualrly

## 2011-07-26 NOTE — Assessment & Plan Note (Signed)
Antibiotics prescribed, pt advised to take entire course

## 2011-07-26 NOTE — Assessment & Plan Note (Signed)
Fasting labs past due, low fat diet encouraged

## 2011-07-26 NOTE — Assessment & Plan Note (Signed)
Improved, pt treated by psychiatry, he has also changed his therapist

## 2011-07-26 NOTE — Assessment & Plan Note (Signed)
Controlled, no change in medication  

## 2011-07-27 ENCOUNTER — Other Ambulatory Visit (HOSPITAL_COMMUNITY): Payer: PRIVATE HEALTH INSURANCE | Admitting: Psychology

## 2011-07-27 ENCOUNTER — Other Ambulatory Visit: Payer: Self-pay | Admitting: Family Medicine

## 2011-07-28 NOTE — Progress Notes (Signed)
    Daily Group Progress Note  Program: CD-IOP   Group Time: 1-2:30 pm  Participation Level: Active  Behavioral Response: Sharing  Type of Therapy: Psycho-education Group  Topic: The Disease Concept of Addiction: first half of group spent presenting the disease concept of addiction. The idea was emphasized that once one has become chemically dependent, it is imperative that they remain free of all mind-altering chemicals. One patient wondered if this meant she couldn't smoke pot any more? I explained what we see happen when one stops using their primary drug of addiction, but continue to use others substances. Typically, they become addicted to the other substance or it brings them back to their primary drug of addiction. Bu the time this session ended, each member understood the importance of total abstinence in recovering from chemical dependency.      Group Time: 2:45-4 pm  Participation Level: Active  Behavioral Response: Appropriate  Type of Therapy: Process Group  Topic: Group Process: second half of group was spent in process. Members shared their current feelings and frustrations in early recovery. One member had relapsed over the weekend and admitted she had been questioning whether she was really an alcoholic. There were 2 new group members and they introduced themselves in this half of group. Both had experienced significant losses and attributed their grief and pain to an increase in their use of chemicals. The session went well with good feedback among the group.     Summary: The patient was attentive and shared his feelings about early recovery. Bill admitted that he is disappointed with himself about having relapsed and the heavy costs it meant to him. He also agreed with another member that he is punishing himself for his errors. The patient provided good support to a group member how had relapsed over the weekend and noted that she had not been ready to face the  challenges that a wedding celebration had represented for her. The patient emphasized the importance of his sponsor and talking about his difficulties and struggles that the 12-step community has provided him. He responded well to this intervention.   Family Program: Family present? No   Name of family member(s):   UDS collected: No Results:  AA/NA attended?: YesMonday, Tuesday, Wednesday, Thursday, Friday, Saturday and Sunday  Sponsor?: Yes   Zamar Odwyer, LCAS

## 2011-07-29 ENCOUNTER — Encounter: Payer: Self-pay | Admitting: Gastroenterology

## 2011-07-29 ENCOUNTER — Other Ambulatory Visit (HOSPITAL_COMMUNITY): Payer: PRIVATE HEALTH INSURANCE | Admitting: Psychology

## 2011-07-29 DIAGNOSIS — F1920 Other psychoactive substance dependence, uncomplicated: Secondary | ICD-10-CM

## 2011-07-30 LAB — DRUG SCREEN, URINE
Benzodiazepines.: NEGATIVE
Methadone: NEGATIVE
Propoxyphene: NEGATIVE

## 2011-07-30 NOTE — Progress Notes (Signed)
    Daily Group Progress Note  Program: CD-IOP   Group Time: 1-2:30 pm  Participation Level: Active  Behavioral Response: Sharing  Type of Therapy: Psycho-education Group  Topic: Building a Recovery Plan: first half of group included a presentation on basic recovery concepts and how to build a daily recovery plan. Group members participated in the discussion and identified the many different issues one must address in early recovery. There was good disclosure with emphasis on being open and honest and reaching out for help. The importance of meetings and getting to know other people in recovery was noted. Members shared about different meetings they find particularly helpful and encouraged the newer members, who are still hesitant, to go to meetings.       Group Time: 2:45-4 pm  Participation Level: Active  Behavioral Response: Appropriate and Sharing  Type of Therapy: Process Group  Topic:Group Process: Second half of group was spent in process. Members discussed current issues and struggles they are dealing with. There was good feedback with a number of members encouraging the importance of spirituality in their daily life.     Summary: The patient reported he has continued to seek support through the 12-step community. Marc [name] admitted that he had resisted the Fellowship and stopped working the program and had relapsed. He noted that he almost died during the relapse. He emphasized the importance of routine, but admitted he was getting "bored" with his routine. I pointed out that he will be graduating on Friday and have to construct a new routine since he won't be coming here 3 days a week. The patient provided good support and encouragement to another group member and he responded well to this intervention.   Family Program: Family present? No   Name of family member(s):   UDS collected: Yes Results: negative  AA/NA attended?: YesMonday, Tuesday, Wednesday, Thursday, Friday,  Saturday and Sunday  Sponsor?: Yes   Marc [name], LCAS

## 2011-07-31 ENCOUNTER — Other Ambulatory Visit (HOSPITAL_COMMUNITY): Payer: PRIVATE HEALTH INSURANCE | Admitting: Psychology

## 2011-08-01 ENCOUNTER — Other Ambulatory Visit: Payer: Self-pay | Admitting: Family Medicine

## 2011-08-03 ENCOUNTER — Other Ambulatory Visit (HOSPITAL_COMMUNITY): Payer: PRIVATE HEALTH INSURANCE

## 2011-08-05 ENCOUNTER — Other Ambulatory Visit (HOSPITAL_COMMUNITY): Payer: PRIVATE HEALTH INSURANCE

## 2011-08-05 NOTE — Progress Notes (Signed)
    Daily Group Progress Note  Program: CD-IOP   Group Time: 1-2:30 pm  Participation Level: Active  Behavioral Response: Resistant  Type of Therapy: Psycho-education Group  Topic: Role-Playing Refusal Skills: first part of group spent role-playing. Two group members were teamed up and one tried to persuade the other to join in alcohol or drug use. The member was encouraged to use all sorts of persuasion to coax the other to use. The only thing the other member could do was say "No".  The role-play lasted 3 minutes. Members shared their experiences at the conclusion of the practice. They admitted it was very tempting to join in on the chemical use and frustrating to only be able to say "No". The importance of practicing one's reactions in case they get into difficult awkward situations was emphasized. Group members agreed that practicing various responses based on the specific scenario would be helpful.     Group Time: 2:45-4 pm  Participation Level: Active  Behavioral Response: Appropriate and Sharing  Type of Therapy: Process Group  Topic: Group Process and Graduation: the second half of group was spent in process. Members shared about their current difficulties and frustrations in early recovery. As the session neared the end, a graduation ceremony was held to honor a group member who was completing the program this afternoon. There were very kind words shared by the group and the graduating member offered hope and encouragement to his fellow members.    Summary: The patient expressed frustration over the exercise and complained that it didn't help him because it was clearly not real. I quickly pointed out to him and the rest of his group members that they have role-played or practiced various scenarios in all aspects of their lives. I used practicing for an interview as an example of how role-playing can help prepare someone for "the real thing". Later on, during process, the  patient admitted he would have to do something with his time in the afternoons now that he is graduating and won't be coming to group 3 times per week. The patient received some kind words from his fellow group members and he offered them support and noted they would all be in his prayers. He made some good comments during the graduation ceremony and has successfully completed this program.    Family Program: Family present? No   Name of family member(s):   UDS collected: No Results:   AA/NA attended?: YesMonday, Tuesday, Wednesday, Thursday, Friday, Saturday and Sunday  Sponsor?: Yes   Kennith Morss, LCAS

## 2011-08-07 ENCOUNTER — Other Ambulatory Visit (HOSPITAL_COMMUNITY): Payer: PRIVATE HEALTH INSURANCE

## 2011-08-08 LAB — CBC WITH DIFFERENTIAL/PLATELET
Basophils Absolute: 0 10*3/uL (ref 0.0–0.1)
HCT: 44.2 % (ref 39.0–52.0)
Lymphocytes Relative: 26 % (ref 12–46)
Monocytes Absolute: 0.4 10*3/uL (ref 0.1–1.0)
Neutro Abs: 2.4 10*3/uL (ref 1.7–7.7)
Platelets: 169 10*3/uL (ref 150–400)
RDW: 13 % (ref 11.5–15.5)
WBC: 3.9 10*3/uL — ABNORMAL LOW (ref 4.0–10.5)

## 2011-08-08 LAB — LIPID PANEL
HDL: 42 mg/dL (ref >39)
LDL Cholesterol: 125 mg/dL — ABNORMAL HIGH (ref 0–99)
Total CHOL/HDL Ratio: 4.3 Ratio
Triglycerides: 60 mg/dL (ref <150)
VLDL: 12 mg/dL (ref 0–40)

## 2011-08-08 LAB — HEPATIC FUNCTION PANEL
Albumin: 4.3 g/dL (ref 3.5–5.2)
Total Protein: 7 g/dL (ref 6.0–8.3)

## 2011-08-08 LAB — PSA: PSA: 0.49 ng/mL (ref <=4.00)

## 2011-08-08 LAB — BASIC METABOLIC PANEL
Chloride: 106 mEq/L (ref 96–112)
Potassium: 5 mEq/L (ref 3.5–5.3)

## 2011-08-10 ENCOUNTER — Other Ambulatory Visit (HOSPITAL_COMMUNITY): Payer: PRIVATE HEALTH INSURANCE

## 2011-08-12 ENCOUNTER — Other Ambulatory Visit (HOSPITAL_COMMUNITY): Payer: PRIVATE HEALTH INSURANCE | Attending: Physician Assistant

## 2011-08-14 ENCOUNTER — Ambulatory Visit (INDEPENDENT_AMBULATORY_CARE_PROVIDER_SITE_OTHER): Payer: PRIVATE HEALTH INSURANCE | Admitting: Psychiatry

## 2011-08-14 ENCOUNTER — Encounter (HOSPITAL_COMMUNITY): Payer: Self-pay | Admitting: Psychiatry

## 2011-08-14 DIAGNOSIS — F331 Major depressive disorder, recurrent, moderate: Secondary | ICD-10-CM

## 2011-08-15 ENCOUNTER — Other Ambulatory Visit: Payer: Self-pay | Admitting: Family Medicine

## 2011-08-17 NOTE — Progress Notes (Signed)
Patient:  Marc [name]   DOB: 1955/11/01  MR Number: [id number]  Location: [facility] Center:  [address] [place],  [place], [address]  Start: Friday 08/14/2011 1:10 PM End: Friday 08/14/2011 2:10 PM  Provider/Observer:     Florencia Reasons, MSW, LCSW   Chief Complaint:      Chief Complaint  Patient presents with  . Depression  . Anxiety    Reason For Service:     The patient initially was referred by Dr. Lolly Mustache to improve coping and social skills as patient was experiencing sadness, depression, grief, and anger issues. The patient is resuming services after a three-month abstinence during which time he voluntarily admitted himself to the hospital due to suicidal thoughts and chemical dependency The patient completed inpatient treatment at the [facility] [place] in [place]. He completed the chemical dependency IOP program in [place] in December 2012. Patient is returning to this practice for a followup treatment.  Interventions Strategy:  Supportive therapy  Participation Level:   Active  Participation Quality:  Appropriate and Sharing      Behavioral Observation:  Well Groomed, Alert, and Depressed.   Current Psychosocial Factors: The patient continues to experience stress related to his reduced physical functioning. Patient continues to feel alone.  Content of Session:   Reviewing symptoms, processing patient's feelings regarding treatment, identifying realistic expectations, practicing a mindfulness activity  Current Status:   The patient reports poor concentration, anger, and increased irritability. He reports fleeting passive suicidal ideations yesterday with no plan or intent. He contracts for safety and agrees to call this practice, call 911, or have someone take him to the emergency room should symptoms worsen. He denies any current suicidal ideations.  Patient Progress:   Fair. The patient is pleased that he participated in the program at the [facility] of  [place] as well as the chemical dependency IOP program to address his addiction to pain medication. He reports regularly attending NA meetings and has been clean from drug use for 90 days.  Patient expresses frustration and guilt regarding being unable to accomplish physical tasks as he would like. He admits pattern of being very critical of self and expresses frustration regarding where he is at this stage of his life.   Target Goals:   Decrease anxiety, increasing self acceptance  Last Reviewed:   08/14/2011  Goals Addressed Today:    Decrease anxiety, increasing self acceptance  Impression/Diagnosis:   The patient has a long-standing history of recurrent periods of depression and continues to experience sadness, anger, depressed mood, and poor concentration. Diagnoses: Maj. depressive disorder, recurrent  Diagnosis:  Axis I:  1. Major depressive disorder, recurrent episode, moderate             Axis II: Deferred

## 2011-08-17 NOTE — Patient Instructions (Signed)
Discussed orally 

## 2011-08-25 ENCOUNTER — Other Ambulatory Visit (HOSPITAL_COMMUNITY): Payer: Self-pay | Admitting: Physician Assistant

## 2011-08-25 ENCOUNTER — Other Ambulatory Visit (HOSPITAL_COMMUNITY): Payer: Self-pay | Admitting: Psychiatry

## 2011-08-25 DIAGNOSIS — F39 Unspecified mood [affective] disorder: Secondary | ICD-10-CM

## 2011-08-25 MED ORDER — QUETIAPINE FUMARATE 50 MG PO TABS: ORAL_TABLET | ORAL | Status: DC

## 2011-08-28 ENCOUNTER — Telehealth (HOSPITAL_COMMUNITY): Payer: Self-pay | Admitting: *Deleted

## 2011-08-28 ENCOUNTER — Other Ambulatory Visit (HOSPITAL_COMMUNITY): Payer: Self-pay | Admitting: Psychiatry

## 2011-08-28 ENCOUNTER — Ambulatory Visit (HOSPITAL_COMMUNITY): Payer: PRIVATE HEALTH INSURANCE | Admitting: Psychiatry

## 2011-08-28 DIAGNOSIS — F39 Unspecified mood [affective] disorder: Secondary | ICD-10-CM

## 2011-09-02 ENCOUNTER — Ambulatory Visit (INDEPENDENT_AMBULATORY_CARE_PROVIDER_SITE_OTHER): Payer: PRIVATE HEALTH INSURANCE | Admitting: Psychiatry

## 2011-09-02 ENCOUNTER — Encounter (HOSPITAL_COMMUNITY): Payer: Self-pay | Admitting: Psychiatry

## 2011-09-02 DIAGNOSIS — F331 Major depressive disorder, recurrent, moderate: Secondary | ICD-10-CM

## 2011-09-03 ENCOUNTER — Encounter (HOSPITAL_COMMUNITY): Payer: Self-pay | Admitting: Psychiatry

## 2011-09-03 ENCOUNTER — Ambulatory Visit (INDEPENDENT_AMBULATORY_CARE_PROVIDER_SITE_OTHER): Payer: PRIVATE HEALTH INSURANCE | Admitting: Psychiatry

## 2011-09-03 VITALS — Wt 167.0 lb

## 2011-09-03 DIAGNOSIS — F329 Major depressive disorder, single episode, unspecified: Secondary | ICD-10-CM

## 2011-09-03 MED ORDER — FLUOXETINE HCL 20 MG PO CAPS: 20.0000 mg | ORAL_CAPSULE | Freq: Every day | ORAL | Status: DC

## 2011-09-03 NOTE — Progress Notes (Signed)
Chief complaint I'm doing better now  History of present illness Patient is a 56 year old Caucasian male who is known to this office due to his psychiatric illness. Patient was last seen in September. Patient was admitted at rehabilitation center as patient was abusing pain medication and drinking alcohol. He was discharge and then required intensive outpatient program at [facility]. Patient was discharged on Seroquel and Prozac. Patient admitted that he was using Percocet hydrocodone Xanax from the streets. He reported he was feeling very depressed and just wanted to kill myself with medication. Patient choice is opiates. Patient reported he has stopped taking all those pain medication and feels Seroquel is working better. Though he still complain of racing thoughts but he is able to sleep 3 hours. He is also going to AA and NA meetings regularly. He also started individual counseling in this office. He is still feel sometime depressed isolated but his intensity is much reduced. Denies any agitation anger or mood swings. He denies any side effects of medication. I review or records and recent blood work which shows increased ALT and AST however patient told it is gradually coming down and is scheduled to see his primary care Dr. again in March to review the results.  Past psychiatric history Patient has history of depression for many years. He has taken in the past Wellbutrin Cymbalta and Celexa. He denies any history of suicidal attempt.  Alcohol and substance use history  Patient has significant history of alcohol with multiple rehabilitation and detox treatment. He has history of DWI. He also had history of taking recreational drugs and he was in the [employer]. He has history of intravenous drug use that cause hepatitis C. Patient also had history of using pain medication.  Medical history Patient has history of back pain Cervical fusion Next surgery Neck pain Hepatitis C Hypertension Benign  tremors High blood pressure He see Dr. Lodema Hong and Dr. Harrel Carina for pain management.  Psychosocial history Patient was born and raised in [place]. He married twice. 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 Patient has multiple family member who has died in past few years. His father died in 01/09/08 and mother died in 2009-01-08 3 at 2 of his sister also died.  Family history Patient had multiple family member who has alcohol problem.  Mental status examination Patient is casually dressed and fairly groomed. He continues to have poor attention and concentration during conversation. He described his mood is anxious and his affect is constricted. His speech is soft but clear and coherent. His thought process is slow but logical. He denies any auditory or visual hallucination. He denies any active or passive suicidal thoughts. There were no psychotic symptoms present. There were no delusion or paranoia present. He is alert and oriented x3. He has some tremors which are chronic. His insight judgment and impulse control is fair.  Diagnosis Axis I Maj. depressive disorder, polysubstance dependence Axis II deferred Axis III see medical history Axis IV mild to moderate Axis V 65-70  Plan I talked to the patient about his illness and recent relapse into alcohol and in medication. Patient at this time very motivated and committed to remain sober. He's been going to groups and attending individual counseling. He feels Seroquel and Prozac is working very well. He reported no side effects. I talked to him about his high liver enzymes and patient is scheduled to see his primary care physician in March. At that  time he will get another blood work. I explained risks and benefits of medication. Patient is aware about consequences of drugs and abusing pain medication. I encourage him to participate in individual therapy. I recommended to call us if he has any question or concern  about the medication otherwise I will see him again in 4-6 weeks. Time spent 30 minutes

## 2011-09-03 NOTE — Patient Instructions (Signed)
Discussed orally 

## 2011-09-03 NOTE — Progress Notes (Signed)
Patient:  Marc [name]   DOB: [birth year]  MR Number: [id number]  Location: Behavioral Health Center:  [address] [place] [place],  [place], [address]  Start: Wednesday 09/02/2011 2:00 PM End: Wednesday 09/02/2011 2:50 PM  Provider/Observer:     Florencia Reasons, MSW, LCSW   Chief Complaint:      Chief Complaint  Patient presents with  . Depression  . Anxiety    Reason For Service:     The patient initially was referred by Dr. Lolly Mustache to improve coping and social skills as patient was experiencing sadness, depression, grief, and anger issues. The patient is resuming services after a three-month abstinence during which time he voluntarily admitted himself to the hospital due to suicidal thoughts and chemical dependency The patient completed inpatient treatment at the [facility] [place] in [place]. He completed the chemical dependency IOP program in [place] in December 2012. Patient is returning to this practice for a followup treatment.  Interventions Strategy:  Supportive therapy  Participation Level:   Active  Participation Quality:  Appropriate and Sharing      Behavioral Observation:  Well Groomed, Alert, and Depressed  Current Psychosocial Factors: The patient continues to experience stress related to his reduced physical functioning. Patient continues to feel alone. Patient also reports concerns about his 25 year old son who is experiencing financial and legal difficulties. Patient also is spacing the possibility of have been surgery on his left arm.  Content of Session:   Reviewing symptoms, processing patient's feelings regarding treatment, identifying realistic expectations, processing feelings, identifying automatic thoughts and core beliefs  Current Status:   The patient reports depressed mood and irritability.. Ports experiencing depression 6/7 days and rates the depression as a 6 on a 10 point scale. He reports no suicidal or homicidal ideations.  Patient  Progress:   Fair. The patient reports continued depressed mood, loss of interest, and poor motivation. However he has been pushing himself to participate in some activities such as reading and exercising as well as playing his guitar. He also has been relying on his spirituality and prayer to manage stress. He continues to have unrealistic expectations of self and expresses frustration when he is unable to perform certain physical tasks. He expresses frustration regarding receiving a recent e-mail from his son requesting help regarding legal matters. Patient is conflicted regarding son and reports son generally wants something when he contacts patient. This also has triggered unresolved issues regarding patient's childhood and his relationship with his father. This has resulted in increased anger and frustration. Per patient's report, he remains abstinent from alcohol use.  Target Goals:    Decrease anxiety, increasing self acceptance  Last Reviewed:   08/14/2011  Goals Addressed Today:    Decrease anxiety, increasing self acceptance  Impression/Diagnosis:   The patient has a long-standing history of recurrent periods of depression and continues to experience sadness, anger, depressed mood, and poor concentration. Diagnoses: Maj. depressive disorder, recurrent  Diagnosis:  Axis I:  1. Major depressive disorder, recurrent, moderate             Axis II: Deferred

## 2011-09-16 ENCOUNTER — Ambulatory Visit (INDEPENDENT_AMBULATORY_CARE_PROVIDER_SITE_OTHER): Payer: PRIVATE HEALTH INSURANCE | Admitting: Psychiatry

## 2011-09-16 ENCOUNTER — Encounter (HOSPITAL_COMMUNITY): Payer: Self-pay | Admitting: Psychiatry

## 2011-09-16 DIAGNOSIS — F331 Major depressive disorder, recurrent, moderate: Secondary | ICD-10-CM

## 2011-09-16 NOTE — Patient Instructions (Signed)
Review hand out.

## 2011-09-16 NOTE — Progress Notes (Addendum)
Patient:  Marc [name]   DOB: 04/15/1956  MR Number: [id number]  Location: Behavioral Health Center:  [address] [place] [place],  [place], [address]  Start: Wednesday 09/16/2011 2:00 PM End: Wednesday 09/16/2011 2:50 PM  Provider/Observer:     Florencia Reasons, MSW, LCSW   Chief Complaint:      Chief Complaint  Patient presents with  . Depression  . Anxiety    Reason For Service:     The patient initially was referred by Dr. Lolly Mustache to improve coping and social skills as patient was experiencing sadness, depression, grief, and anger issues. The patient is resuming services after a three-month absence during which time he voluntarily admitted himself to the hospital due to suicidal thoughts and chemical dependency The patient completed inpatient treatment at the [facility] [place] in [place]. He completed the chemical dependency IOP program in [place] in December 2012. Patient is returning to this practice for a followup treatment.  Interventions Strategy:  Supportive therapy  Participation Level:   Active  Participation Quality:  Appropriate and Sharing      Behavioral Observation:  Well Groomed, Alert, and Depressed  Current Psychosocial Factors:  Patient continues to feel alone and has a limited support system.  Content of Session:   Reviewing symptoms,  processing feelings, identifying automatic thoughts and core beliefs, challenging cognitive distortions  Current Status:   The patient reports improved mood and decreased irritability but continued anxiety. He reports no suicidal or homicidal ideations.  Patient Progress:   Good. The patient reports recently having surgery on his left arm and is experiencing relief due to decreased pain. He is pleased that he has had support from a friend and his neighbors. However, he still reports feeling lonely as he would like male companionship. He reports being less depressed but still having ruminating thoughts, anxiety, and excessive  worry. He also continues to have unresolved anger and conflict regarding the relationship with his father. The patient is able to begin to recognize his thought process and effects on his mood and behavior.  Target Goals:    Decrease anxiety, increasing self acceptance  Last Reviewed:   08/14/2011  Goals Addressed Today:    Decrease anxiety, increasing self acceptance  Impression/Diagnosis:   The patient has a long-standing history of recurrent periods of depression and continues to experience sadness, anger, depressed mood, and poor concentration. Diagnoses: Maj. depressive disorder, recurrent  Diagnosis:  Axis I:  1. Major depressive disorder, recurrent, moderate             Axis II: Deferred

## 2011-09-24 ENCOUNTER — Other Ambulatory Visit (HOSPITAL_COMMUNITY): Payer: Self-pay | Admitting: Psychiatry

## 2011-09-24 DIAGNOSIS — F339 Major depressive disorder, recurrent, unspecified: Secondary | ICD-10-CM

## 2011-09-25 ENCOUNTER — Other Ambulatory Visit (HOSPITAL_COMMUNITY): Payer: Self-pay | Admitting: Psychiatry

## 2011-09-25 ENCOUNTER — Other Ambulatory Visit (HOSPITAL_COMMUNITY): Payer: Self-pay | Admitting: *Deleted

## 2011-09-25 DIAGNOSIS — F339 Major depressive disorder, recurrent, unspecified: Secondary | ICD-10-CM

## 2011-09-25 MED ORDER — QUETIAPINE FUMARATE 50 MG PO TABS: ORAL_TABLET | ORAL | Status: DC

## 2011-10-06 ENCOUNTER — Ambulatory Visit (INDEPENDENT_AMBULATORY_CARE_PROVIDER_SITE_OTHER): Payer: PRIVATE HEALTH INSURANCE | Admitting: Psychiatry

## 2011-10-06 ENCOUNTER — Encounter (HOSPITAL_COMMUNITY): Payer: Self-pay | Admitting: Psychiatry

## 2011-10-06 DIAGNOSIS — F339 Major depressive disorder, recurrent, unspecified: Secondary | ICD-10-CM

## 2011-10-06 DIAGNOSIS — F329 Major depressive disorder, single episode, unspecified: Secondary | ICD-10-CM

## 2011-10-06 MED ORDER — QUETIAPINE FUMARATE 50 MG PO TABS: ORAL_TABLET | ORAL | Status: DC

## 2011-10-06 NOTE — Progress Notes (Signed)
Chief complaint I was doing better on to my dog died last week  istory of present illness Patient is a 56 year old Caucasian male who is known to this office due to his psychiatric illness. Patient came for his followup appointment. He reported last week was very sad depressed due to his dog died. Patient is unclear what happened at the.mistakenly eat poison. Patient has been stopped for past 7 years. He admitted poor sleep racing thoughts and more isolated since then. Though he denies any active suicidal thinking but admitted some time hopeless and tearful. Patient likes his Seroquel but wondering if dose can be increased further. He also has recently had surgery and given Vicodin which he is taking every 6-8 hours. Patient denies any drinking or using illegal substances. Patient is compliant with his Prozac. He denies any paranoia agitation or any mood swings. He reported no side effects of medication.   Past psychiatric history Patient has history of depression for many years. He has taken in the past Wellbutrin Cymbalta and Celexa. He denies any history of suicidal attempt.  Alcohol and substance use history  Patient has significant history of alcohol with multiple rehabilitation and detox treatment. He has history of DWI. He also had history of taking recreational drugs and he was in the [employer]. He has history of intravenous drug use that cause hepatitis C. Patient also had history of using pain medication.  Medical history Patient has history of back pain Cervical fusion Next surgery Neck pain Hepatitis C Hypertension Benign tremors High blood pressure He see Dr. Lodema Hong and Dr. Harrel Carina for pain management.  Psychosocial history Patient was born and raised in [place]. He married twice. 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 Patient has multiple family member who has died in past few years. His father died in 01-28-08 and mother died in Jan 27, 2009 3 at 2  of his sister also died.  Family history Patient had multiple family member who has alcohol problem.  Mental status examination Patient is casually dressed and fairly groomed. He appears somewhat withdrawn but relevant in conversation. He denies any active or passive suicidal thinking and homicidal thinking. He described his mood is sad and depressed and his affect is constricted. He denies any auditory or visual hallucination. There were no tremors or extrapyramidal side effects present there were no paranoia or delusions present at this time. His attention and concentration is fair. His thought process is slow but logical. He has some poverty of thought content. He's alert and oriented x3. His insight judgment and impulse control is fair.  Diagnosis Axis I Maj. depressive disorder, polysubstance dependence Axis II deferred Axis III see medical history Axis IV mild to moderate Axis V 65-70  Plan I talked to him about the loss and grievance. I offered increase Prozac however patient developed more agitation and anger with increase Prozac in the past. He preferred to take more Seroquel which has helped him a lot. He is tolerating his medication without any side effects. We also talked about safety plan that in case if he feel worsening of her symptoms or any time having suicidal thinking or homicidal thinking then he will call 911 or go to local ER. Patient acknowledged and agreed with the plan. We will also see therapist tomorrow to talk more about grief and increase coping and social skills. I will increase his Seroquel to 50 mg 3 times a day and 100 mg at bedtime. The also  talked about interaction of pain medication and his psychiatric medication. Patient is not drinking or using any illegal substances at this time. I will see him again in 10 days. Time spent 30 minutes

## 2011-10-07 ENCOUNTER — Ambulatory Visit (INDEPENDENT_AMBULATORY_CARE_PROVIDER_SITE_OTHER): Payer: PRIVATE HEALTH INSURANCE | Admitting: Psychiatry

## 2011-10-07 ENCOUNTER — Encounter (HOSPITAL_COMMUNITY): Payer: Self-pay | Admitting: Psychiatry

## 2011-10-07 DIAGNOSIS — F331 Major depressive disorder, recurrent, moderate: Secondary | ICD-10-CM

## 2011-10-07 NOTE — Patient Instructions (Signed)
Continue to use handout

## 2011-10-07 NOTE — Progress Notes (Signed)
Patient:  Marc [name]   DOB: [birth year]  MR Number: [id number]  Location: Behavioral Health Center:  [address] [place] [place],  [place], [address]  Start: Wednesday 10/07/2011 1:10 PM End: Wednesday 10/07/2011 2:00 PM  Provider/Observer:     Florencia Reasons, MSW, LCSW   Chief Complaint:      Chief Complaint  Patient presents with  . Depression  . Anxiety    Reason For Service:     The patient initially was referred by Dr. Lolly Mustache to improve coping and social skills as patient was experiencing sadness, depression, grief, and anger issues. The patient is resuming services after a three-month absence during which time he voluntarily admitted himself to the hospital due to suicidal thoughts and chemical dependency The patient completed inpatient treatment at the [facility] [place] in [place]. He completed the chemical dependency IOP program in [place] in December 2012. Patient is returning to this practice for a followup treatment.  Interventions Strategy:  Supportive therapy, cognitive behavioral therapy  Participation Level:   Active  Participation Quality:  Appropriate and Sharing      Behavioral Observation:  Well Groomed, Alert, and sad, angry  Current Psychosocial Factors:  Patient's dog died suddenly this week on patient's birthday.  Content of Session:   Reviewing symptoms,  processing grief and loss issues, identifying and challenging cognitive distortions  Current Status:   The patient reports increased sadness and anger this week. He reports passive SI the day his dog died -no plan, no intent.  He reports no current active suicidal or homicidal ideations. Patient agrees to call this practice, call 911, or have someone take him to the ER should symptoms worsen.  Patient Progress:   Fair. The patient reports increased sadness and anger as his dog died this week. Patient had his pet for 7 years and shares pictures of his dog with therapist. Therapist works with patient to  processing grief and loss issues.  He expresses some frustration with himself that he has been angry and questionned God about the loss of his pet. Patient is becoming more aware of his thought patterns and has been using the common thinking errors handout. Therapist works with patient to identify his most prevalent thinking errors which normally involve the words "should" and "ought" and ways to challenge errors.   Target Goals:    Decrease anxiety, increasing self acceptance  Last Reviewed:   08/14/2011  Goals Addressed Today:    Decrease anxiety, increasing self acceptance  Impression/Diagnosis:   The patient has a long-standing history of recurrent periods of depression and continues to experience sadness, anger, depressed mood, and poor concentration. Diagnoses: Maj. depressive disorder, recurrent  Diagnosis:  Axis I:  1. Major depressive disorder, recurrent, moderate             Axis II: Deferred

## 2011-10-15 ENCOUNTER — Encounter (HOSPITAL_COMMUNITY): Payer: Self-pay | Admitting: Psychiatry

## 2011-10-15 ENCOUNTER — Ambulatory Visit (INDEPENDENT_AMBULATORY_CARE_PROVIDER_SITE_OTHER): Payer: PRIVATE HEALTH INSURANCE | Admitting: Psychiatry

## 2011-10-15 DIAGNOSIS — F339 Major depressive disorder, recurrent, unspecified: Secondary | ICD-10-CM

## 2011-10-15 DIAGNOSIS — F329 Major depressive disorder, single episode, unspecified: Secondary | ICD-10-CM

## 2011-10-15 MED ORDER — FLUOXETINE HCL 20 MG PO CAPS: 20.0000 mg | ORAL_CAPSULE | Freq: Every day | ORAL | Status: DC

## 2011-10-15 MED ORDER — QUETIAPINE FUMARATE 50 MG PO TABS: ORAL_TABLET | ORAL | Status: DC

## 2011-10-15 NOTE — Progress Notes (Signed)
Chief complaint I am doing better.   istory of present illness Patient is a 56 year old Caucasian male who came for his followup appointment. On his last visit he complained of depression due to his dog died. We have increased Seroquel and patient is reporting much improvement in his depression. He is taking Seroquel 50 mg 3 times a day and 100 mg at bedtime. Patient reported no side effects of medication. He is sleeping more. He is thinking to get another dog from shelter. He is also seeing therapist regularly. He denies any agitation anger or mood swings. He continues to have social isolation and decreased energy to do things however he is less depressed and less anxious from past. He denies any tremors or side effects of medication. He denies drinking or using any illegal substances.   Past psychiatric history Patient has history of depression for many years. He has taken in the past Wellbutrin Cymbalta and Celexa. He denies any history of suicidal attempt.  Alcohol and substance use history Patient has significant history of alcohol with multiple rehabilitation and detox treatment. He has history of DWI. He also had history of taking recreational drugs and he was in the [employer]. He has history of intravenous drug use that cause hepatitis C. Patient also had history of using pain medication.  Medical history Patient has history of back pain Cervical fusion Next surgery Neck pain Hepatitis C Hypertension Benign tremors High blood pressure He see Dr. Lodema Hong and Dr. Harrel Carina for pain management.  Psychosocial history Patient was born and raised in [place]. He married twice. 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 Patient has multiple family member who has died in past few years. His father died in 01-16-2008 and mother died in 2009-01-15 3 at 2 of his sister also died.  Family history Patient had multiple family member who has alcohol problem.  Mental status  examination Patient is casually dressed and groomed. He is much calm and cooperative. He maintained good eye contact. He described his mood anxious however his affect is bright from the past. His speech is clear and coherent. His thought process is slow but logical linear and goal-directed.  He denies any active or passive suicidal thinking or homicidal thinking. He denies any auditory or visual hallucination. He is alert and oriented x3 his attention and concentration is improved from the past. There were no paranoia or delusions present at this time. His insight judgment and impulse control is okay.  Diagnosis Axis I Maj. depressive disorder, polysubstance dependence Axis II deferred Axis III see medical history Axis IV mild to moderate Axis V 65-70  Plan I will continue his Seroquel and Prozac at present dosage.  I did reinforce safety plan that in case if he feel worsening of her symptoms or any time having suicidal thinking or homicidal thinking then he will call 911 or go to local ER. He will see therapist on a regular basis. I will see him again in 4 weeks.

## 2011-10-28 ENCOUNTER — Ambulatory Visit (HOSPITAL_COMMUNITY): Payer: PRIVATE HEALTH INSURANCE | Admitting: Psychiatry

## 2011-11-02 ENCOUNTER — Encounter (HOSPITAL_COMMUNITY): Payer: Self-pay | Admitting: Psychiatry

## 2011-11-02 ENCOUNTER — Ambulatory Visit (INDEPENDENT_AMBULATORY_CARE_PROVIDER_SITE_OTHER): Payer: PRIVATE HEALTH INSURANCE | Admitting: Psychiatry

## 2011-11-02 DIAGNOSIS — F331 Major depressive disorder, recurrent, moderate: Secondary | ICD-10-CM

## 2011-11-03 NOTE — Patient Instructions (Signed)
Discussed orally 

## 2011-11-03 NOTE — Progress Notes (Signed)
Patient:  Marc [name]   DOB: 1956-02-08  MR Number: [id number]  Location: [facility] Center:  [address] [place],  [place], [address]  Start: Monday 11/02/2011 2:00 PM End: Monday 11/02/2011 2:50 PM  Provider/Observer:     Florencia Reasons, MSW, LCSW   Chief Complaint:      Chief Complaint  Patient presents with  . Depression    Reason For Service:     The patient initially was referred by Dr. Lolly Mustache to improve coping and social skills as patient was experiencing sadness, depression, grief, and anger issues. The patient has resumed services after a three-month absence during which time he voluntarily admitted himself to the hospital due to suicidal thoughts and chemical dependency The patient completed inpatient treatment at the [facility] [place] in [place]. He completed the chemical dependency IOP program in [place] in December 2012. Patient is seen today for a followup as he continues to experience depression/grief loss issues.  Interventions Strategy:  Supportive therapy, cognitive behavioral therapy  Participation Level:   Active  Participation Quality:  Appropriate and Sharing      Behavioral Observation:  Well Groomed, Alert, and sad,   Current Psychosocial Factors:  The anniversary of patient's sister's death is tomorrow.  The patient's dog died recently  Content of Session:   Reviewing symptoms,  processing grief and loss issues, identifying memorializations, identifying and challenging cognitive distortions  Current Status:   The patient reports continued sadness, poor motivation, and decreased involvement in activity but decreased anger. He denies suicidal ideations.   Patient Progress:   Fair. The patient reports increased thoughts about his sister as well and has his other family members as the anniversary of the death of his sister approaches. Therapist works with patient to process grief and loss issues as well as identify ways to cope with the  anniversary of his sister's death tomorrow. The patient is able to identify possible memorializationss such as going to a movie. Patient is also able to identify a balance of negative and positive memories of his deceased family members.The patient reports poor motivation this week and increased time staining in the bed. Therapist and patient explore ways to increase activity.  Patient reports feeling better spiritually and is relying on his relationship with God. He reports increased reading and studying the bible.  Patient also reports improved ability to identify and challenge cognitive distortions.  Target Goals:    Decrease anxiety, increasing self acceptance  Last Reviewed:   08/14/2011  Goals Addressed Today:    Decrease anxiety, increasing self acceptance  Impression/Diagnosis:   The patient has a long-standing history of recurrent periods of depression and continues to experience sadness, anger, depressed mood, and poor concentration. Diagnoses: Maj. depressive disorder, recurrent  Diagnosis:  Axis I:  1. Major depressive disorder, recurrent, moderate             Axis II: Deferred

## 2011-11-17 ENCOUNTER — Ambulatory Visit (INDEPENDENT_AMBULATORY_CARE_PROVIDER_SITE_OTHER): Payer: PRIVATE HEALTH INSURANCE | Admitting: Psychiatry

## 2011-11-17 ENCOUNTER — Encounter (HOSPITAL_COMMUNITY): Payer: Self-pay | Admitting: Psychiatry

## 2011-11-17 DIAGNOSIS — F329 Major depressive disorder, single episode, unspecified: Secondary | ICD-10-CM

## 2011-11-17 DIAGNOSIS — F339 Major depressive disorder, recurrent, unspecified: Secondary | ICD-10-CM

## 2011-11-17 MED ORDER — FLUOXETINE HCL 20 MG PO CAPS: 20.0000 mg | ORAL_CAPSULE | Freq: Every day | ORAL | Status: DC

## 2011-11-17 MED ORDER — QUETIAPINE FUMARATE 50 MG PO TABS: ORAL_TABLET | ORAL | Status: DC

## 2011-11-17 NOTE — Progress Notes (Signed)
Chief complaint I am doing better.   istory of present illness Patient is a 56 year old Caucasian male who came for his followup appointment.  He is been compliant with his medication and reported no side effects.  He likes her call which is helping his sleep and anxiety.  For past 2 weeks he feel more energy and motivated to do things.  He is thinking to get a puppy dog for his companion .  He is seeing therapist regularly.  He denies any agitation anger or mood swings.  He denies any active or passive suicidal thinking .  His appetite and sleep is unchanged.  He has any agitation anger or any paranoid thinking.  He's not using any illegal substance or drinking alcohol.  Past psychiatric history Patient has history of depression for many years. He has taken in the past Wellbutrin Cymbalta and Celexa. He denies any history of suicidal attempt.  Alcohol and substance use history Patient has significant history of alcohol with multiple rehabilitation and detox treatment. He has history of DWI. He also had history of taking recreational drugs and he was in the [employer]. He has history of intravenous drug use that cause hepatitis C. Patient also had history of using pain medication.  Medical history Patient has history of back pain Cervical fusion Next surgery Neck pain Hepatitis C Hypertension Benign tremors High blood pressure He see Dr. Lodema Hong and Dr. Harrel Carina for pain management.  Psychosocial history Patient was born and raised in [place]. He married twice. 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 Patient has multiple family member who has died in past few years. His father died in 05-Feb-2008 and mother died in 02/04/09 3 at 2 of his sister also died.  Family history Patient had multiple family member who has alcohol problem.  Mental status examination Patient is casually dressed and groomed. He is calm and cooperative. He maintained good eye contact. He  described his mood anxious however his affect is bright from the past. His speech is clear and coherent. His thought process is slow but logical linear and goal-directed.  He denies any active or passive suicidal thinking or homicidal thinking. He denies any auditory or visual hallucination. He is alert and oriented x3 his attention and concentration is improved from the past. There were no paranoia or delusions present at this time. His insight judgment and impulse control is okay.  Diagnosis Axis I Maj. depressive disorder, polysubstance dependence Axis II deferred Axis III see medical history Axis IV mild to moderate Axis V 65-70  Plan I will continue his Seroquel and Prozac at present dosage.  I did reinforce safety plan that in case if he feel worsening of her symptoms or any time having suicidal thinking or homicidal thinking then he will call 911 or go to local ER. He will see therapist on a regular basis. I will see him again in 6 weeks.

## 2011-11-24 ENCOUNTER — Ambulatory Visit: Payer: PRIVATE HEALTH INSURANCE | Admitting: Family Medicine

## 2011-12-02 ENCOUNTER — Ambulatory Visit (INDEPENDENT_AMBULATORY_CARE_PROVIDER_SITE_OTHER): Payer: PRIVATE HEALTH INSURANCE | Admitting: Psychiatry

## 2011-12-02 DIAGNOSIS — F331 Major depressive disorder, recurrent, moderate: Secondary | ICD-10-CM

## 2011-12-02 NOTE — Progress Notes (Signed)
Patient:  Marc [name]   DOB: [birth year]  MR Number: [id number]  Location: [facility] Center:  [address] [place] [place],  [place], [address]  Start: Wednesday 12/02/2011 2:10 PM End: Wednesday 12/02/2011 3:00 PM  Provider/Observer:     Florencia Reasons, MSW, LCSW   Chief Complaint:      Chief Complaint  Patient presents with  . Depression    Reason For Service:     The patient initially was referred by Dr. Lolly Mustache to improve coping and social skills as patient was experiencing sadness, depression, grief, and anger issues. The patient has resumed services after a three-month absence during which time he voluntarily admitted himself to the hospital due to suicidal thoughts and chemical dependency The patient completed inpatient treatment at the [facility] [place] in [place]. He completed the chemical dependency IOP program in [place] in December 2012. Patient is seen today for a follow-up appointment.  Interventions Strategy:  Supportive therapy, cognitive behavioral therapy  Participation Level:   Active  Participation Quality:  Appropriate and Sharing      Behavioral Observation:  Well Groomed, Alert,   Current Psychosocial Factors:    Content of Session:   Reviewing symptoms, identifying and challenging cognitive distortions, discussing his adjustment to having a new dog, identifying realistic expectations and ways to set and maintain boundaries in his relationships  Current Status:   The patient reports decreased sadness, increased motivation, and increased involvement in activity.   Patient Progress:   Good. The patient reports experiencing increased arm pain as well as back pain.  He expresses frustration as he has been unable to work out as he would like.  Therapist and patient explore other ways patient can remain active including asking his chiropractor for recommendations regarding exercises. He reports that he recently got a new puppy and shares pictures with  therapist.  He reports being very busy and happy with puppy. He reports recent contact with his male friend and reports this triggered negative memories and feelings along with increased stress. Patient reports being able to set boundaries with her and disengaging from their conversation. without feeling guilty.  Patient also shares he has continued his involvement with one of his friends who has schizophrenia.  Therapist and patient discuss realistic expectations related to involvement with friend as well as ways to set and maintain boundaries regarding assistance to friend.     Target Goals:    Decrease anxiety, increasing self acceptance  Last Reviewed:   08/14/2011  Goals Addressed Today:    Decrease anxiety, increasing self acceptance  Impression/Diagnosis:   The patient has a long-standing history of recurrent periods of depression and continues to experience sadness, anger, depressed mood, and poor concentration. Diagnoses: Maj. depressive disorder, recurrent  Diagnosis:  Axis I:  1. Major depressive disorder, recurrent, moderate             Axis II: Deferred

## 2011-12-02 NOTE — Patient Instructions (Signed)
Discussed orally 

## 2011-12-08 ENCOUNTER — Ambulatory Visit: Payer: Self-pay | Admitting: Family Medicine

## 2011-12-11 ENCOUNTER — Other Ambulatory Visit: Payer: Self-pay | Admitting: Family Medicine

## 2011-12-17 ENCOUNTER — Ambulatory Visit: Payer: Self-pay | Admitting: Family Medicine

## 2011-12-22 ENCOUNTER — Other Ambulatory Visit: Payer: Self-pay | Admitting: Family Medicine

## 2011-12-24 ENCOUNTER — Other Ambulatory Visit (HOSPITAL_COMMUNITY): Payer: Self-pay | Admitting: Chiropractic Medicine

## 2011-12-24 ENCOUNTER — Ambulatory Visit (INDEPENDENT_AMBULATORY_CARE_PROVIDER_SITE_OTHER): Payer: PRIVATE HEALTH INSURANCE | Admitting: Family Medicine

## 2011-12-24 ENCOUNTER — Encounter: Payer: Self-pay | Admitting: Family Medicine

## 2011-12-24 VITALS — BP 122/80 | HR 87 | Resp 18 | Ht 70.0 in | Wt 170.1 lb

## 2011-12-24 DIAGNOSIS — F329 Major depressive disorder, single episode, unspecified: Secondary | ICD-10-CM

## 2011-12-24 DIAGNOSIS — R220 Localized swelling, mass and lump, head: Secondary | ICD-10-CM

## 2011-12-24 DIAGNOSIS — Z23 Encounter for immunization: Secondary | ICD-10-CM

## 2011-12-24 DIAGNOSIS — M545 Low back pain, unspecified: Secondary | ICD-10-CM

## 2011-12-24 DIAGNOSIS — R7309 Other abnormal glucose: Secondary | ICD-10-CM

## 2011-12-24 DIAGNOSIS — R110 Nausea: Secondary | ICD-10-CM | POA: Insufficient documentation

## 2011-12-24 DIAGNOSIS — G894 Chronic pain syndrome: Secondary | ICD-10-CM | POA: Insufficient documentation

## 2011-12-24 DIAGNOSIS — I10 Essential (primary) hypertension: Secondary | ICD-10-CM

## 2011-12-24 DIAGNOSIS — M549 Dorsalgia, unspecified: Secondary | ICD-10-CM

## 2011-12-24 DIAGNOSIS — R221 Localized swelling, mass and lump, neck: Secondary | ICD-10-CM

## 2011-12-24 MED ORDER — KETOROLAC TROMETHAMINE 60 MG/2ML IJ SOLN
60.0000 mg | Freq: Once | INTRAMUSCULAR | Status: AC
  Administered 2011-12-24: 60 mg via INTRAMUSCULAR

## 2011-12-24 MED ORDER — PREDNISONE (PAK) 5 MG PO TABS: 5.0000 mg | ORAL_TABLET | ORAL | Status: DC

## 2011-12-24 MED ORDER — ONDANSETRON HCL 4 MG/2ML IJ SOLN
4.0000 mg | Freq: Once | INTRAMUSCULAR | Status: AC
  Administered 2011-12-24: 4 mg via INTRAMUSCULAR

## 2011-12-24 MED ORDER — METHYLPREDNISOLONE ACETATE 80 MG/ML IJ SUSP
80.0000 mg | Freq: Once | INTRAMUSCULAR | Status: AC
  Administered 2011-12-24: 80 mg via INTRAMUSCULAR

## 2011-12-24 NOTE — Assessment & Plan Note (Signed)
Increased in recent times, but pt in counseling and treatment

## 2011-12-24 NOTE — Assessment & Plan Note (Signed)
zofran in office today

## 2011-12-24 NOTE — Patient Instructions (Signed)
F/u in 3 month,  Please call if you need me before  You are referred for [name] of right side of face and neck to eval swelling.  You will get 2 injections in the office today for back  pain and prednisone dose pack is sent in also.  Zofran in office today for nausea  HBA1C today, please work on exercise and diet you are pre diabetic  TdAp today , it is due and you have lacerations form dog bites on your right arm

## 2011-12-24 NOTE — Assessment & Plan Note (Signed)
Increased and uncontrolled, toradol 60mg  im and depo medrol 80mg  Im in the office and pred dose pack. Pt to continue with chiro practer per his choice over PT

## 2011-12-24 NOTE — Progress Notes (Signed)
  Subjective:    Patient ID: Marc [name], male    DOB: 11/19/1955, 56 y.o.   MRN: [id number]  HPI The PT is here for follow up and re-evaluation of chronic medical conditions, medication management and review of any available recent lab and radiology data.  Preventive health is updated, specifically  Cancer screening and Immunization.   Questions or concerns regarding consultations or procedures which the PT has had in the interim are  addressed. The PT denies any adverse reactions to current medications since the last visit.  3 week h/o painless right facial swelling. 3 month h/o increased low back pain radiating down right leg, has known disc disease, pain started when he lifted his 50 pound dog, going to chiropracter now and getting little relief. C/o multiple dog scratches from his new dog, no infection at sites but unaware of when he had the last tetanus. Had nausea, vomitting and diarrheah earlir this week so missed his PT for recent left upper arm surgery, still nauseated, requests shot Increased depressiion since dog of 8 years died but sees mental health regularly       Review of Systems See HPI Denies recent fever or chills. Denies sinus pressure, nasal congestion, ear pain or sore throat. Denies chest congestion, productive cough or wheezing. Denies chest pains, palpitations and leg swelling    Denies dysuria, frequency, hesitancy or incontinence. . Denies headaches, seizures, numbness, or tingling.        Objective:   Physical Exam Patient alert and oriented and in no cardiopulmonary distress.  HEENT: Right facial swelling, non tender, EOMI, no sinus tenderness,  oropharynx pink and moist.  Neck supple no adenopathy.  Chest: Clear to auscultation bilaterally.  CVS: S1, S2 no murmurs, no S3.  ABD: Soft non tender. Bowel sounds normal.  Ext: No edema  MS: decreased  ROM spine,adequate in  shoulders, hips and knees.  Skin: Intact, no ulcerations or rash  noted.  Psych: Good eye contact, flat  affect. Memory intact CNS: CN 2-12 intact, power, tone and sensation normal throughout.        Assessment & Plan:

## 2011-12-24 NOTE — Assessment & Plan Note (Signed)
Controlled, no change in medication  

## 2011-12-24 NOTE — Progress Notes (Signed)
Addended by: Kandis Fantasia B on: 12/24/2011 05:08 PM   Modules accepted: Orders

## 2011-12-28 ENCOUNTER — Ambulatory Visit (HOSPITAL_COMMUNITY)
Admission: RE | Admit: 2011-12-28 | Discharge: 2011-12-28 | Disposition: A | Discharge status: Home / Self Care | Payer: PRIVATE HEALTH INSURANCE | Source: Ambulatory Visit | Attending: Family Medicine | Admitting: Family Medicine

## 2011-12-28 ENCOUNTER — Ambulatory Visit (HOSPITAL_COMMUNITY)
Admission: RE | Admit: 2011-12-28 | Discharge: 2011-12-28 | Disposition: A | Discharge status: Home / Self Care | Payer: PRIVATE HEALTH INSURANCE | Source: Ambulatory Visit | Attending: Chiropractic Medicine | Admitting: Chiropractic Medicine

## 2011-12-28 DIAGNOSIS — M545 Low back pain, unspecified: Secondary | ICD-10-CM | POA: Insufficient documentation

## 2011-12-28 DIAGNOSIS — R220 Localized swelling, mass and lump, head: Secondary | ICD-10-CM

## 2011-12-28 DIAGNOSIS — R209 Unspecified disturbances of skin sensation: Secondary | ICD-10-CM | POA: Insufficient documentation

## 2011-12-28 DIAGNOSIS — M79609 Pain in unspecified limb: Secondary | ICD-10-CM | POA: Insufficient documentation

## 2011-12-28 DIAGNOSIS — M5126 Other intervertebral disc displacement, lumbar region: Secondary | ICD-10-CM | POA: Insufficient documentation

## 2011-12-29 ENCOUNTER — Ambulatory Visit (HOSPITAL_COMMUNITY): Payer: PRIVATE HEALTH INSURANCE

## 2011-12-30 ENCOUNTER — Ambulatory Visit (HOSPITAL_COMMUNITY): Payer: Self-pay | Admitting: Psychiatry

## 2011-12-31 ENCOUNTER — Other Ambulatory Visit: Payer: Self-pay | Admitting: Family Medicine

## 2012-01-01 ENCOUNTER — Telehealth: Payer: Self-pay | Admitting: Family Medicine

## 2012-01-05 ENCOUNTER — Ambulatory Visit (HOSPITAL_COMMUNITY): Payer: Self-pay | Admitting: Psychiatry

## 2012-01-05 NOTE — Telephone Encounter (Signed)
Pt in to office.  Notified pt that it was too early for tordal.

## 2012-01-05 NOTE — Telephone Encounter (Signed)
I agree, he needs to get the [facility] spine , advise use of tylenol up to 3 times daily please

## 2012-01-05 NOTE — Telephone Encounter (Signed)
Notified pt that I would ask seek dr. advise

## 2012-01-06 ENCOUNTER — Other Ambulatory Visit: Payer: Self-pay | Admitting: Family Medicine

## 2012-01-07 ENCOUNTER — Telehealth: Payer: Self-pay | Admitting: Family Medicine

## 2012-01-08 ENCOUNTER — Other Ambulatory Visit: Payer: Self-pay

## 2012-01-08 ENCOUNTER — Telehealth: Payer: Self-pay | Admitting: Family Medicine

## 2012-01-08 MED ORDER — METHOCARBAMOL 750 MG PO TABS: ORAL_TABLET | ORAL | Status: DC

## 2012-01-08 NOTE — Telephone Encounter (Signed)
Called and left pt a message to return call

## 2012-01-08 NOTE — Telephone Encounter (Signed)
Already sent a message to the dr regarding this same matter

## 2012-01-08 NOTE — Telephone Encounter (Signed)
Is still having back pain and wants to know if he can have something else sent in

## 2012-01-08 NOTE — Telephone Encounter (Signed)
Pt aware.

## 2012-01-08 NOTE — Telephone Encounter (Signed)
I will not prescribe pain med for him, please let him know we had discussed his prob with this at the last visit, I recommend otc pain med as prescribed for him

## 2012-01-14 ENCOUNTER — Ambulatory Visit (INDEPENDENT_AMBULATORY_CARE_PROVIDER_SITE_OTHER): Payer: PRIVATE HEALTH INSURANCE | Admitting: Psychiatry

## 2012-01-14 ENCOUNTER — Encounter (HOSPITAL_COMMUNITY): Payer: Self-pay | Admitting: Psychiatry

## 2012-01-14 DIAGNOSIS — F329 Major depressive disorder, single episode, unspecified: Secondary | ICD-10-CM

## 2012-01-14 DIAGNOSIS — F1910 Other psychoactive substance abuse, uncomplicated: Secondary | ICD-10-CM

## 2012-01-14 DIAGNOSIS — F339 Major depressive disorder, recurrent, unspecified: Secondary | ICD-10-CM

## 2012-01-14 MED ORDER — FLUOXETINE HCL 20 MG PO CAPS: 20.0000 mg | ORAL_CAPSULE | Freq: Every day | ORAL | Status: DC

## 2012-01-14 MED ORDER — QUETIAPINE FUMARATE 50 MG PO TABS: ORAL_TABLET | ORAL | Status: DC

## 2012-01-14 NOTE — Progress Notes (Signed)
Chief complaint Medication management and followup.     History of present illness Patient is a 56 year old Caucasian male who came for his followup appointment.  Recently patient fell and complain of shoulder pain .  He has given pain medication from his primary care physician however he continued to endorse poor sleep due to pain.  Patient has multiple physical illness.  He takes multiple medication for his physical symptoms.  He is been taking Seroquel and Prozac regularly.  He feels proud that he is been compliant most of the time.  He denies any agitation anger mood swing.  He denies any crying spells.  Recently he has fascial ultrasound due to swelling and he is waiting for the results.  He also has recent hemoglobin A1c which was normal.  Overall he described his mood is stable.  He enjoys his time with his dog .  He is seeing therapist for coping and social skills.  He's not drinking or using any illegal substance.  Current psychiatric medication Prozac 20 mg daily Seroquel 50 mg 3 times a day and 2 at bedtime.    Past psychiatric history Patient has history of depression for many years. He has taken in the past Wellbutrin Cymbalta and Celexa. He denies any history of suicidal attempt.  Alcohol and substance use history Patient has significant history of alcohol with multiple rehabilitation and detox treatment. He has history of DWI. He also had history of taking recreational drugs and he was in the [employer]. He has history of intravenous drug use that cause hepatitis C. Patient also had history of using pain medication.  Medical history Patient has history of back pain Cervical fusion Next surgery Neck pain Hepatitis C Hypertension Benign tremors High blood pressure He see Dr. Lodema Hong and Dr. Harrel Carina for pain management.  Psychosocial history Patient was born and raised in [place]. He married twice. 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  Patient has multiple family member who has died in past few years. His father died in January 23, 2008 and mother died in 01-22-2009 3 at 2 of his sister also died.  Family history Patient had multiple family member who has alcohol problem.  Mental status examination Patient is casually dressed and groomed. He is calm and cooperative. He maintained fair eye contact. He described his mood anxious however his affect is constricted.  His speech is clear and coherent. His thought process is slow but logical linear and goal-directed.  There no tremors or shakes present at this time. He denies any active or passive suicidal thinking or homicidal thinking. He denies any auditory or visual hallucination. He is alert and oriented x3 his attention and concentration is improved from the past. There were no paranoia or delusions present at this time. His insight judgment and impulse control is okay.  Diagnosis Axis I Maj. depressive disorder, polysubstance dependence Axis II deferred Axis III see medical history Axis IV mild to moderate Axis V 65-70  Plan I will continue his Seroquel and Prozac at present dosage.  I recommend to call us if he is a question or concern about the medication or if he feel worsening of the symptoms.  I will see him again in 2 months.

## 2012-01-15 ENCOUNTER — Ambulatory Visit (INDEPENDENT_AMBULATORY_CARE_PROVIDER_SITE_OTHER): Payer: PRIVATE HEALTH INSURANCE | Admitting: Family Medicine

## 2012-01-15 ENCOUNTER — Encounter: Payer: Self-pay | Admitting: Family Medicine

## 2012-01-15 VITALS — BP 140/90 | HR 73 | Resp 16 | Ht 70.0 in | Wt 171.0 lb

## 2012-01-15 DIAGNOSIS — E785 Hyperlipidemia, unspecified: Secondary | ICD-10-CM

## 2012-01-15 DIAGNOSIS — Z79899 Other long term (current) drug therapy: Secondary | ICD-10-CM

## 2012-01-15 DIAGNOSIS — I10 Essential (primary) hypertension: Secondary | ICD-10-CM

## 2012-01-15 DIAGNOSIS — M549 Dorsalgia, unspecified: Secondary | ICD-10-CM

## 2012-01-15 DIAGNOSIS — F329 Major depressive disorder, single episode, unspecified: Secondary | ICD-10-CM

## 2012-01-15 MED ORDER — METHYLPREDNISOLONE ACETATE 80 MG/ML IJ SUSP
80.0000 mg | Freq: Once | INTRAMUSCULAR | Status: AC
  Administered 2012-01-15: 80 mg via INTRAMUSCULAR

## 2012-01-15 MED ORDER — FENTANYL 25 MCG/HR TD PT72: 1.0000 | MEDICATED_PATCH | TRANSDERMAL | Status: DC

## 2012-01-15 MED ORDER — HYDROCODONE-ACETAMINOPHEN 5-500 MG PO TABS: ORAL_TABLET | ORAL | Status: DC

## 2012-01-15 MED ORDER — KETOROLAC TROMETHAMINE 60 MG/2ML IM SOLN
60.0000 mg | Freq: Once | INTRAMUSCULAR | Status: AC
  Administered 2012-01-15: 60 mg via INTRAMUSCULAR

## 2012-01-15 MED ORDER — AMLODIPINE BESYLATE 5 MG PO TABS: ORAL_TABLET | ORAL | Status: DC

## 2012-01-15 NOTE — Progress Notes (Signed)
  Subjective:    Patient ID: Marc [name], male    DOB: 1956-01-16, 56 y.o.   MRN: [id number]  HPI The PT is here for follow up and re-evaluation of chronic medical conditions, medication management and review of any available recent lab and radiology data.  Preventive health is updated, specifically  Cancer screening and Immunization.   Questions or concerns regarding consultations or procedures which the PT has had in the interim are  addressed. The PT denies any adverse reactions to current medications since the last visit.  Specific request for review of pain management in light of recent MRI spine which shows nerve compression and multiple bulging discs. His narc addiction is openly addressed, he does have upcoming appt with pain management, I believe epidural will help. States he is incapacitated with pain, tramadol of no benefit, last prednisone shot helped for a short while. Has been seeing chiropracter for years who ordered the MRI     Review of Systems See HPI Denies recent fever or chills. Denies sinus pressure, nasal congestion, ear pain or sore throat. Denies chest congestion, productive cough or wheezing. Denies chest pains, palpitations and leg swelling Denies abdominal pain, nausea, vomiting,diarrhea or constipation.   Denies dysuria, frequency, hesitancy or incontinence.  Denies headaches, seizures, numbness, or tingling. Denies uncontrolled  depression, anxiety or insomnia. Denies skin break down or rash.        Objective:   Physical Exam Patient alert and oriented and in no cardiopulmonary distress.  HEENT: No facial asymmetry, EOMI, no sinus tenderness,  oropharynx pink and moist.  Neck supple no adenopathy.  Chest: Clear to auscultation bilaterally.  CVS: S1, S2 no murmurs, no S3.  ABD: Soft non tender. Bowel sounds normal.  Ext: No edema  MS: decreased  ROM spine,adeqaute in  shoulders, hips and knees.  Skin: Intact, no ulcerations or rash  noted.  Psych: Good eye contact, normal affect. Memory intact not anxious or depressed appearing.  CNS: CN 2-12 intact, power, tone and sensation normal throughout.        Assessment & Plan:

## 2012-01-15 NOTE — Assessment & Plan Note (Signed)
Uncontrolled, recent MRI shows pathology, referred to pain specialist. Pain contract signed, I am aware he has addiction to pain med, this has been discussed openly, pain contract signed, will start management here and see how he does. Epidural should help a lot. Pt engaged in decision making

## 2012-01-15 NOTE — Assessment & Plan Note (Signed)
Followed closely by psych seen 1 day ago

## 2012-01-15 NOTE — Assessment & Plan Note (Signed)
Inadequate control at this viosit, states missed medication no change at this time

## 2012-01-15 NOTE — Assessment & Plan Note (Signed)
Hyperlipidemia:Low fat diet discussed and encouraged.  Updated labs past due 

## 2012-01-15 NOTE — Patient Instructions (Signed)
F/u in 3 month  New medication for pain from this office , you need to sign a pain contract, and stick with it.I will let Dr Eduard Clos know what medication you are taking  Blood pressure high today, important that you remember to take medication as prescribed every day  Toradol and depo medrol in office today  Please keep appointment with local pain specialist, based on your recent MRI I believe that epidural injections will help a lot.  Fasting lipid and cmp in 3 months, just before next visit

## 2012-01-16 LAB — DRUG SCREEN, URINE
Amphetamine Screen, Ur: NEGATIVE
Barbiturate Quant, Ur: NEGATIVE
Benzodiazepines.: NEGATIVE
Cocaine Metabolites: NEGATIVE
Creatinine,U: 60.51 mg/dL

## 2012-01-18 ENCOUNTER — Telehealth: Payer: Self-pay | Admitting: Family Medicine

## 2012-01-20 ENCOUNTER — Ambulatory Visit (HOSPITAL_COMMUNITY): Payer: Self-pay | Admitting: Psychiatry

## 2012-01-20 ENCOUNTER — Telehealth: Payer: Self-pay | Admitting: Family Medicine

## 2012-01-20 NOTE — Telephone Encounter (Signed)
No change in pain management, pls let him know

## 2012-01-20 NOTE — Telephone Encounter (Signed)
Lower back hurting some still and down the back of his right leg is unbearable pain (rated at an 8 on pain scale). The fentanyl helped a lot with his back. Taking 1 hydrocodone daily in the am and it runs out about 2 hours after taking it. Going to pain management the 26th and wanted to ask if the hydrocodone can be increased where he can take one in the afternoon as well.Marc KitchenMarland [name]

## 2012-01-20 NOTE — Telephone Encounter (Signed)
See previous message

## 2012-01-27 ENCOUNTER — Other Ambulatory Visit: Payer: Self-pay | Admitting: Family Medicine

## 2012-01-27 ENCOUNTER — Telehealth: Payer: Self-pay | Admitting: Family Medicine

## 2012-01-27 MED ORDER — FENTANYL 50 MCG/HR TD PT72: 1.0000 | MEDICATED_PATCH | TRANSDERMAL | Status: DC

## 2012-01-27 NOTE — Telephone Encounter (Signed)
Called and left message for pt to return call.  

## 2012-01-27 NOTE — Telephone Encounter (Signed)
I have increased the fentanyl to , he can apply TWO patches till done and come to collect and sign for the higher dose of fentanyl

## 2012-01-27 NOTE — Telephone Encounter (Signed)
Pt aware and will come by and pickup rx next week.

## 2012-01-27 NOTE — Telephone Encounter (Signed)
See previous message

## 2012-01-28 ENCOUNTER — Other Ambulatory Visit: Payer: Self-pay | Admitting: Family Medicine

## 2012-02-01 ENCOUNTER — Ambulatory Visit (INDEPENDENT_AMBULATORY_CARE_PROVIDER_SITE_OTHER): Payer: PRIVATE HEALTH INSURANCE | Admitting: Psychiatry

## 2012-02-01 DIAGNOSIS — F331 Major depressive disorder, recurrent, moderate: Secondary | ICD-10-CM

## 2012-02-01 NOTE — Patient Instructions (Signed)
Discussed orally 

## 2012-02-01 NOTE — Progress Notes (Signed)
Patient:  Marc [name]   DOB: May 25, 1956  MR Number: [id number]  Location: Behavioral Health Center:  [address] [place],  [place], [address]  Start: Monday 02/01/2012 1:05 PM End: Monday 02/01/2012 1:55 PM  Provider/Observer:     Florencia Reasons, MSW, LCSW   Chief Complaint:      Chief Complaint  Patient presents with  . Depression  . Anxiety    Reason For Service:     The patient initially was referred by Dr. Lolly Mustache to improve coping and social skills as patient was experiencing sadness, depression, grief, and anger issues. The patient has resumed services after a three-month absence during which time he voluntarily admitted himself to the hospital due to suicidal thoughts and chemical dependency The patient completed inpatient treatment at the [facility] in [place]. He completed the chemical dependency IOP program in [place] in December 2012. Patient is seen today for a follow-up appointment.  Interventions Strategy:  Supportive therapy, cognitive behavioral therapy  Participation Level:   Active  Participation Quality:  Appropriate and Sharing      Behavioral Observation:  Well Groomed, Alert,   Current Psychosocial Factors:    Content of Session:   Reviewing symptoms, processing feelings, identifying patient's use of assertiveness skills, identifying areas within patient's control  Current Status:   The patient reports improved mood but increased frustration due to increased back pain. He denies any suicidal and homicidal ideations.  Patient Progress:   Good. The patient reports experiencing increased back pain for the past several weeks. He expresses frustration that it has taken on file to actually start her pain management program. He is pleased and hopeful about his scheduled consultation with a doctor who specializes in giving epidural injections to alleviate back pain. His appointment is 02/03/2012. Patient reports he is beginning to experience some  relief as he uses a pain patch. Therapist works with patient to identify ways to chart his level of pain and the activities he can use at each pain level. Patient is pleased with his use of assertiveness skills with his friend. He cites an example of a recent incident with his friend where he successfully set boundaries.     Target Goals:    Decrease anxiety, increasing self acceptance  Last Reviewed:   08/14/2011  Goals Addressed Today:    Decrease anxiety, increasing self acceptance  Impression/Diagnosis:   The patient has a long-standing history of recurrent periods of depression and continues to experience sadness, anger, depressed mood, and poor concentration. Diagnoses: Maj. depressive disorder, recurrent  Diagnosis:  Axis I:  1. Major depressive disorder, recurrent, moderate             Axis II: Deferred

## 2012-02-04 ENCOUNTER — Other Ambulatory Visit: Payer: Self-pay | Admitting: Family Medicine

## 2012-02-04 ENCOUNTER — Telehealth: Payer: Self-pay

## 2012-02-04 NOTE — Telephone Encounter (Signed)
pls advise ultrasound showed no abnormality, if he still has a lot of swelling may attempt to order mRI  Let him know i will no longer be prescribing the fentanyl patches for hiim or his pain medication now that he is with a pain specialist, so he needs to address those concerns with the pain Doc. Please also fax the note I have left in your area to Dr Eduard Clos regarding the pt

## 2012-02-05 NOTE — Telephone Encounter (Signed)
Patient aware and note faxed to Dr Eduard Clos.

## 2012-02-25 ENCOUNTER — Ambulatory Visit: Payer: Self-pay | Admitting: Gastroenterology

## 2012-03-07 ENCOUNTER — Ambulatory Visit (INDEPENDENT_AMBULATORY_CARE_PROVIDER_SITE_OTHER): Payer: PRIVATE HEALTH INSURANCE | Admitting: Psychiatry

## 2012-03-07 DIAGNOSIS — F331 Major depressive disorder, recurrent, moderate: Secondary | ICD-10-CM

## 2012-03-07 NOTE — Patient Instructions (Signed)
Discussed orally 

## 2012-03-07 NOTE — Progress Notes (Signed)
Patient:  Marc [name]   DOB: Sep 07, 1955  MR Number: [id number]  Location: [facility]:  [address] [place],  [place], [address]  Start: Monday 03/07/2012 1:10 PM End: Monday 03/07/2012 1:55 PM  Provider/Observer:     Florencia Reasons, MSW, LCSW   Chief Complaint:      Chief Complaint  Patient presents with  . Depression    Reason For Service:     The patient initially was referred by Dr. Lolly Mustache to improve coping and social skills as patient was experiencing sadness, depression, grief, and anger issues. The patient has resumed services after a three-month absence during which time he voluntarily admitted himself to the hospital due to suicidal thoughts and chemical dependency The patient completed inpatient treatment at the [facility] in [place]. He completed the chemical dependency IOP program in [place] in December 2012. Patient is seen today for a follow-up appointment.  Interventions Strategy:  Supportive therapy, cognitive behavioral therapy  Participation Level:   Active  Participation Quality:  Appropriate and Sharing      Behavioral Observation:  Well Groomed, Alert, anxious  Current Psychosocial Factors:    Content of Session:   Reviewing symptoms, processing feelings, identifying ways to improve sleep hygiene, identifying ways to set and maintain boundaries.  Current Status:   The patient reports increased agitation and irritability. He denies any suicidal and homicidal ideations.  Patient Progress:   Fair. The patient expresses frustration and disappointment as he continues to experience chronic pain although he is in a pain management program. He reports receiving temporary pain relief after injections. Patient also reports increased sleep difficulty saying he sleeps about 2 or3 hours at a time and then staying awake for 2 or 3 hours before falling asleep. Patient admits heavy caffeine use, three quarters of a pot of coffee daily. Therapist  works with patient to identify ways to improve sleep hygiene. Patient also reports stress related to his caretaker responsibilities for his dog as he requires a a lot of attention. Patient also expresses frustration about his neighbors due to the noise. Patient reports advertising several items on [employer], becoming obsessed with checking the status of the items, and feeling guilty when he doesn't do his devotional. Therapist works with patient to identify ways to structure his time to develop a schedule. Patient reports that he continues to maintain sobriety and has not abused drugs since September 2012. He attends NA/AA meetings 4 times a week. Patient reports increased difficulty saying no to one of his friends. Therapist and patient discuss ways to set and maintain boundaries in his relationship with his friend.   Target Goals:    Decrease anxiety, increasing self acceptance  Last Reviewed:   08/14/2011  Goals Addressed Today:    Decrease anxiety, increasing self acceptance  Impression/Diagnosis:   The patient has a long-standing history of recurrent periods of depression and continues to experience sadness, anger, depressed mood, and poor concentration. Diagnoses: Maj. depressive disorder, recurrent  Diagnosis:  Axis I:  1. Major depressive disorder, recurrent, moderate             Axis II: Deferred

## 2012-03-15 ENCOUNTER — Encounter (HOSPITAL_COMMUNITY): Payer: Self-pay | Admitting: Psychiatry

## 2012-03-15 ENCOUNTER — Ambulatory Visit (INDEPENDENT_AMBULATORY_CARE_PROVIDER_SITE_OTHER): Payer: PRIVATE HEALTH INSURANCE | Admitting: Psychiatry

## 2012-03-15 VITALS — BP 125/85 | HR 88 | Wt 172.0 lb

## 2012-03-15 DIAGNOSIS — F329 Major depressive disorder, single episode, unspecified: Secondary | ICD-10-CM

## 2012-03-15 DIAGNOSIS — F339 Major depressive disorder, recurrent, unspecified: Secondary | ICD-10-CM

## 2012-03-15 DIAGNOSIS — M549 Dorsalgia, unspecified: Secondary | ICD-10-CM

## 2012-03-15 MED ORDER — FLUOXETINE HCL 20 MG PO CAPS: 20.0000 mg | ORAL_CAPSULE | Freq: Every day | ORAL | Status: DC

## 2012-03-15 MED ORDER — QUETIAPINE FUMARATE 50 MG PO TABS: ORAL_TABLET | ORAL | Status: DC

## 2012-03-15 NOTE — Progress Notes (Signed)
Chief complaint Medication management and followup.     History of present illness Patient is a 56 year old Caucasian male who came for his followup appointment.  Recently patient is very stressed about his dog who has been bothering him.  Patient told his dog is bothering neighbors and he is thinking seriously to return his dog.  Patient is also concerned about his friend who has schizophrenia and recently he has difficulty getting his medication.  Patient is trying to help him by taking him to the doctors.  He endorse some days he feel very tired and has no energy to do anything is.  He also complained poor sleep sometimes but he feel Seroquel and Prozac is working well.  He takes multiple pain medication for his multiple health issues.  He scheduled to see his primary care physician next month.  He is also scheduled to have colonoscopy next week.  Denies any side effects of medication.  He denies any recent agitation anger mood swing.  He denies any active or passive suicidal thoughts.  He's not drinking or using any illegal substance.  Current psychiatric medication Prozac 20 mg daily Seroquel 50 mg 3 times a day and 2 at bedtime.    Past psychiatric history Patient has history of depression for many years. He has taken in the past Wellbutrin Cymbalta and Celexa. He denies any history of suicidal attempt.  Alcohol and substance use history Patient has significant history of alcohol with multiple rehabilitation and detox treatment. He has history of DWI. He also had history of taking recreational drugs and he was in the [employer]. He has history of intravenous drug use that cause hepatitis C. Patient also had history of using pain medication.  Medical history Patient has history of back pain Cervical fusion Next surgery Neck pain Hepatitis C Hypertension Benign tremors High blood pressure He see Dr. Lodema Hong and Dr. Harrel Carina for pain management.  Psychosocial history Patient was born and  raised in [place].  He married twice. 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 Patient has multiple family member who has died in past few years. His father died in 01-16-08 and mother died in Jan 15, 2009 3 at 2 of his sister also died.  Patient lives by himself.  Family history Patient had multiple family member who has alcohol problem.  Mental status examination Patient is casually dressed and groomed. He is tired but cooperative.  He maintained fair eye contact. He described his mood neutral and his affect is constricted.  His speech is slow and he has some thought blocking. His thought process is slow but logical linear and goal-directed.  There no tremors or shakes present at this time. He denies any active or passive suicidal thinking or homicidal thinking. He denies any auditory or visual hallucination. He is alert and oriented x3.  His attention and concentration is distracted. There were no paranoia or delusions present at this time. His insight judgment and impulse control is okay.  Diagnosis Axis I Maj. depressive disorder, polysubstance dependence Axis II deferred Axis III see medical history Axis IV mild to moderate Axis V 65-70  Plan I recommend that if he continued to feel distressed and overwhelmed with the dog that he should consider giving his dog back .  Currently patient is fairly stable on his psychiatric medication.  I will continue his Seroquel and Prozac at present does.  I recommend to call us if he is a question or concern  about the medication or if he feel worsening of the symptoms.  I will see him again in 2 months.  Time spent 30 minutes.

## 2012-03-25 ENCOUNTER — Ambulatory Visit: Payer: Self-pay | Admitting: Family Medicine

## 2012-03-28 ENCOUNTER — Other Ambulatory Visit: Payer: Self-pay | Admitting: Family Medicine

## 2012-03-31 ENCOUNTER — Other Ambulatory Visit: Payer: Self-pay | Admitting: Family Medicine

## 2012-04-01 ENCOUNTER — Other Ambulatory Visit: Payer: Self-pay

## 2012-04-01 MED ORDER — METHOCARBAMOL 750 MG PO TABS: 750.0000 mg | ORAL_TABLET | Freq: Three times a day (TID) | ORAL | Status: DC

## 2012-04-06 ENCOUNTER — Ambulatory Visit (INDEPENDENT_AMBULATORY_CARE_PROVIDER_SITE_OTHER): Payer: PRIVATE HEALTH INSURANCE | Admitting: Gastroenterology

## 2012-04-06 ENCOUNTER — Encounter: Payer: Self-pay | Admitting: Gastroenterology

## 2012-04-06 VITALS — BP 141/80 | HR 81 | Temp 97.0°F | Ht 70.0 in | Wt 168.8 lb

## 2012-04-06 DIAGNOSIS — K219 Gastro-esophageal reflux disease without esophagitis: Secondary | ICD-10-CM

## 2012-04-06 DIAGNOSIS — B1920 Unspecified viral hepatitis C without hepatic coma: Secondary | ICD-10-CM

## 2012-04-06 DIAGNOSIS — K589 Irritable bowel syndrome without diarrhea: Secondary | ICD-10-CM

## 2012-04-06 DIAGNOSIS — R1013 Epigastric pain: Secondary | ICD-10-CM

## 2012-04-06 DIAGNOSIS — K3189 Other diseases of stomach and duodenum: Secondary | ICD-10-CM

## 2012-04-06 DIAGNOSIS — B182 Chronic viral hepatitis C: Secondary | ICD-10-CM

## 2012-04-06 MED ORDER — LUBIPROSTONE 8 MCG PO CAPS: ORAL_CAPSULE | ORAL | Status: DC

## 2012-04-06 NOTE — Progress Notes (Signed)
Reminder in epic to follow up with SF in E30 in 4 months °

## 2012-04-06 NOTE — Assessment & Plan Note (Addendum)
PERSISTS IN THE SETTING OF NSAID USE & PMHx: IBS/NOW CONSTIPATION PREDOMINANT. MOST LIKELY DUE TO NSAID GASTRITIS, NON-ULCER DYSPEPSIA, AND LESS LIKELY PUD OR GASTRIC CA. GAINED 7 LBS SINCE SEP 2012.  AVOID FOOD THAT CAUSES BLOATING. HO GIVEN. EGD-DUODENAL Bx SEP 17. OPV IN 4 MOS.

## 2012-04-06 NOTE — Progress Notes (Signed)
  Subjective:    Patient ID: Marc [name], male    DOB: [birth year], 56 y.o.   MRN: [id number]  PCP: [facility]  HPI LAST SEEN SEP 2012, 161 LBS: FAILED TO SHOW FOR EGD. STILL HAVING DYSPEPSIA.  Nothing taste good. Stomach feel queazy. TAKING MLX /SENNA TO HAVE A BM. DIET HASN'T CHANGE. STRESS WORSE. NO OTHER COMPLAINTS. FEELS QUEAZY-OFF AN ON FOR 2-3 TIMES A WEEK. GETS PAIN IN RIGHT. TAKING MLX-QD, SENNA: 1XQ5 DAYS. BMs: PELLETS/LOOSE. FEELS BLOATED. PASSING GAS AND BURPING. NO HEARTBURN OR INDIGESTION. APPETITE: OK. NO BLOOD IN STOOL OR MELENA. USES ASPIRIN 1/2 325 MG & ALKA SELZTER EVERY OTHER DAY. IBUPROFEN 1X/DAY-OTC(1). ALIGN DAILY. NO CIGS/ETOH.  Past Medical History  Diagnosis Date  . Hepatitis C 1979  . Hepatitis C reactive     LIVER BX 2008-CHRONIC ACTIVE HEPATITIS  . Back problem   . GERD (gastroesophageal reflux disease)   . Depression   . IBS (irritable bowel syndrome)   . HTN (hypertension)   . Hypercholesterolemia   . Hx of adenomatous colonic polyps 2008    due for surveillance 2018  . Knee pain   . Substance abuse     narcotic addiction  . Thyroiditis     History of  . Normal echocardiogram 05/25/2011    EF 55%, mild TR  . Normal cardiac stress test 05/25/2011    [facility]    Past Surgical History  Procedure Date  . Appendectomy   . Tonsillectomy   . Carpal tunnel release   . Right ring finger   . Spinal stenosis, had screws put in neck 04/11/2009    DR KRITZER  . Upper gastrointestinal endoscopy 2008 [facility] ABD PAIN WEIGHT LOSS d100 v8 phen 12.5    NL  . Colonoscopy 2008 [facility] ARS D100 V8 PHEN 12.5    2 SIMPLE ADENOMAS (< 1 CM)  . Knee arthroscopy   . Ulnar nerve transposition       Review of Systems     Objective:   Physical Exam  Vitals reviewed. Constitutional: He is oriented to person, place, and time. He appears well-nourished. No distress.  HENT:  Head: Normocephalic and atraumatic.  Mouth/Throat: Oropharynx is clear and  moist. No oropharyngeal exudate.  Eyes: Pupils are equal, round, and reactive to light.  Neck: Normal range of motion. Neck supple.  Cardiovascular: Normal rate, regular rhythm and normal heart sounds.   Pulmonary/Chest: Effort normal and breath sounds normal. No respiratory distress.  Abdominal: Soft. Bowel sounds are normal. He exhibits no distension. There is no tenderness.  Musculoskeletal: He exhibits no edema.  Lymphadenopathy:    He has no cervical adenopathy.  Neurological: He is alert and oriented to person, place, and time.       NO  NEW FOCAL DEFICITS   Psychiatric:       NL MOOD FLAT AFFECT          Assessment & Plan:

## 2012-04-06 NOTE — Assessment & Plan Note (Signed)
Sx CONTROLLED.  CONTINUE OMP.

## 2012-04-06 NOTE — Patient Instructions (Addendum)
UPPER ENDOSCOPY SEP 17.  YOU NEED AN ABDOMINAL ULTRASOUND.  CONTINUE ALIGN.  ADD AMITIZA TO TREAT CONSTIPATION. IT CAN CAUSE NAUSEA. AVOID FOOD THAT CAUSES GAS AND BLOATING. SEE HANDOUT.  FOLLOW UP IN 3 MOS.

## 2012-04-06 NOTE — Assessment & Plan Note (Signed)
NOW WITH CONSTIPATION IN THE SETTING OF NARCOTIC USE.  ADD AMITIZA. HOLD MIRALAX. SENNA PRN. OPV IN 4 MOS.

## 2012-04-06 NOTE — Assessment & Plan Note (Signed)
LAST IMAGING 2008.  ABD U/S TO EVALUATE FOR CIRRHOSIS.

## 2012-04-07 ENCOUNTER — Ambulatory Visit (HOSPITAL_COMMUNITY): Payer: Self-pay | Admitting: Psychiatry

## 2012-04-08 ENCOUNTER — Ambulatory Visit (HOSPITAL_COMMUNITY)
Admission: RE | Admit: 2012-04-08 | Discharge: 2012-04-08 | Disposition: A | Discharge status: Home / Self Care | Payer: PRIVATE HEALTH INSURANCE | Source: Ambulatory Visit | Attending: Gastroenterology | Admitting: Gastroenterology

## 2012-04-08 DIAGNOSIS — I10 Essential (primary) hypertension: Secondary | ICD-10-CM | POA: Insufficient documentation

## 2012-04-08 DIAGNOSIS — B1920 Unspecified viral hepatitis C without hepatic coma: Secondary | ICD-10-CM | POA: Insufficient documentation

## 2012-04-08 DIAGNOSIS — K838 Other specified diseases of biliary tract: Secondary | ICD-10-CM | POA: Insufficient documentation

## 2012-04-08 DIAGNOSIS — R110 Nausea: Secondary | ICD-10-CM | POA: Insufficient documentation

## 2012-04-12 ENCOUNTER — Encounter (HOSPITAL_COMMUNITY): Payer: Self-pay | Admitting: Pharmacy Technician

## 2012-04-12 ENCOUNTER — Telehealth: Payer: Self-pay | Admitting: Gastroenterology

## 2012-04-12 NOTE — Telephone Encounter (Signed)
Have pt follow the bloat diet handout he was provided. Avoid motrin, ibuprofen, advil, aleve, etc. Start taking a probiotic daily. Please provide samples if necessary. Increase Amitiza to BID Further recommendations after EGD. This will help shed light on clinical picture.

## 2012-04-12 NOTE — Telephone Encounter (Signed)
Pt is calling to see if result are back from Ultrasound and also has questions about the upper abd pain and bloating he is having. Please call him back at [phone]

## 2012-04-12 NOTE — Telephone Encounter (Signed)
Called pt. I told him that Dr. Darrick Penna is out and she has not reviewed his [name] yet, but it looked OK. She will have recommendations after she reviews. He said that he is having some left side abdominal discomfort that radiates to his ribs. Sometimes the discomfort seems to cause him to have some difficulty breathing. He has a lot of bloating. He had fish for supper last night. Said he is not eating a lot because he just doesn't get hungry. Still having some constipation. He is taking senna daily, align, Amitiza 8 mcg once a day and supposed to increase that to bid today. I told him since Dr.Fields is out, I will see if Gerrit Halls, NP has any recommendation since she was the last extender that seen him.

## 2012-04-12 NOTE — Progress Notes (Signed)
Faxed to PCP

## 2012-04-12 NOTE — Telephone Encounter (Signed)
Pt returned call and was informed. He is taking Align and samples were provided # 28.

## 2012-04-12 NOTE — Telephone Encounter (Signed)
LMOM to call.

## 2012-04-13 NOTE — Telephone Encounter (Signed)
Pt called back and was in formed

## 2012-04-13 NOTE — Telephone Encounter (Signed)
PLEASE CALL PT.  HIS U/S DID NOT SHOW CIRRHOSIS. HIS BILE DUCT IS DILATED. HE NEEDS TO COMPLETE HIS UPPER ENDOSCOPY. WE RECHECK HIS LIVER TESTS(HFP) ON THE DAY OF HIS ENDOSCOPY.    HE WILL NEED A MRCP SCHEDULED AFTER HIS EGD TO EVALUATE HIS BILE DUCT.

## 2012-04-13 NOTE — Telephone Encounter (Signed)
LMOM to call.

## 2012-04-14 ENCOUNTER — Other Ambulatory Visit: Payer: Self-pay | Admitting: Gastroenterology

## 2012-04-14 DIAGNOSIS — K838 Other specified diseases of biliary tract: Secondary | ICD-10-CM

## 2012-04-14 NOTE — Telephone Encounter (Signed)
REVIEWED.  

## 2012-04-14 NOTE — Telephone Encounter (Signed)
Patient is scheduled for MRCP on 04/29/12 at 8:00 am and he is aware

## 2012-04-15 ENCOUNTER — Telehealth: Payer: Self-pay | Admitting: Family Medicine

## 2012-04-15 NOTE — Telephone Encounter (Signed)
Called patient No answer.  Told to call back if he still needed to speak with me

## 2012-04-19 ENCOUNTER — Ambulatory Visit: Payer: Self-pay | Admitting: Family Medicine

## 2012-04-26 ENCOUNTER — Encounter (HOSPITAL_COMMUNITY)
Admission: RE | Disposition: A | Discharge status: Home / Self Care | Payer: Self-pay | Source: Ambulatory Visit | Attending: Gastroenterology

## 2012-04-26 ENCOUNTER — Ambulatory Visit (HOSPITAL_COMMUNITY)
Admission: RE | Admit: 2012-04-26 | Discharge: 2012-04-26 | Disposition: A | Discharge status: Home / Self Care | Payer: PRIVATE HEALTH INSURANCE | Source: Ambulatory Visit | Attending: Gastroenterology | Admitting: Gastroenterology

## 2012-04-26 ENCOUNTER — Encounter (HOSPITAL_COMMUNITY): Payer: Self-pay | Admitting: *Deleted

## 2012-04-26 DIAGNOSIS — K2990 Gastroduodenitis, unspecified, without bleeding: Secondary | ICD-10-CM

## 2012-04-26 DIAGNOSIS — B1920 Unspecified viral hepatitis C without hepatic coma: Secondary | ICD-10-CM

## 2012-04-26 DIAGNOSIS — R1013 Epigastric pain: Secondary | ICD-10-CM

## 2012-04-26 DIAGNOSIS — K3189 Other diseases of stomach and duodenum: Secondary | ICD-10-CM | POA: Insufficient documentation

## 2012-04-26 DIAGNOSIS — I10 Essential (primary) hypertension: Secondary | ICD-10-CM | POA: Insufficient documentation

## 2012-04-26 DIAGNOSIS — E7800 Pure hypercholesterolemia, unspecified: Secondary | ICD-10-CM | POA: Insufficient documentation

## 2012-04-26 DIAGNOSIS — K2970 Gastritis, unspecified, without bleeding: Secondary | ICD-10-CM

## 2012-04-26 DIAGNOSIS — K2940 Chronic atrophic gastritis without bleeding: Secondary | ICD-10-CM | POA: Insufficient documentation

## 2012-04-26 HISTORY — PX: ESOPHAGOGASTRODUODENOSCOPY: SHX5428

## 2012-04-26 SURGERY — EGD (ESOPHAGOGASTRODUODENOSCOPY)
Anesthesia: Moderate Sedation

## 2012-04-26 MED ORDER — MIDAZOLAM HCL 5 MG/5ML IJ SOLN
INTRAMUSCULAR | Status: AC
  Filled 2012-04-26: qty 10

## 2012-04-26 MED ORDER — PROMETHAZINE HCL 25 MG/ML IJ SOLN
INTRAMUSCULAR | Status: DC | PRN
  Administered 2012-04-26 (×2): 12.5 mg via INTRAVENOUS

## 2012-04-26 MED ORDER — MEPERIDINE HCL 100 MG/ML IJ SOLN
INTRAMUSCULAR | Status: AC
  Filled 2012-04-26: qty 2

## 2012-04-26 MED ORDER — STERILE WATER FOR IRRIGATION IR SOLN
Status: DC | PRN
  Administered 2012-04-26: 13:00:00

## 2012-04-26 MED ORDER — SODIUM CHLORIDE 0.45 % IV SOLN
INTRAVENOUS | Status: DC
  Administered 2012-04-26: 1000 mL via INTRAVENOUS

## 2012-04-26 MED ORDER — SODIUM CHLORIDE 0.9 % IJ SOLN
INTRAMUSCULAR | Status: AC
  Filled 2012-04-26: qty 10

## 2012-04-26 MED ORDER — MEPERIDINE HCL 100 MG/ML IJ SOLN
INTRAMUSCULAR | Status: DC | PRN
  Administered 2012-04-26: 25 mg via INTRAVENOUS
  Administered 2012-04-26: 50 mg via INTRAVENOUS
  Administered 2012-04-26 (×2): 25 mg via INTRAVENOUS

## 2012-04-26 MED ORDER — BUTAMBEN-TETRACAINE-BENZOCAINE 2-2-14 % EX AERO
INHALATION_SPRAY | CUTANEOUS | Status: DC | PRN
Start: 2012-04-26 — End: 2012-04-26
  Administered 2012-04-26: 2 via TOPICAL

## 2012-04-26 MED ORDER — PROMETHAZINE HCL 25 MG/ML IJ SOLN
INTRAMUSCULAR | Status: AC
  Filled 2012-04-26: qty 1

## 2012-04-26 MED ORDER — MIDAZOLAM HCL 5 MG/5ML IJ SOLN
INTRAMUSCULAR | Status: DC | PRN
  Administered 2012-04-26: 2 mg via INTRAVENOUS
  Administered 2012-04-26: 1 mg via INTRAVENOUS
  Administered 2012-04-26: 2 mg via INTRAVENOUS
  Administered 2012-04-26: 1 mg via INTRAVENOUS
  Administered 2012-04-26: 2 mg via INTRAVENOUS
  Administered 2012-04-26: 1 mg via INTRAVENOUS

## 2012-04-26 NOTE — Progress Notes (Signed)
Reported off to susan sparks rn

## 2012-04-26 NOTE — Interval H&P Note (Signed)
History and Physical Interval Note:  04/26/2012 12:35 PM  Marc [name]  has presented today for surgery, with the diagnosis of DYSPEPSIA  The various methods of treatment have been discussed with the patient and family. After consideration of risks, benefits and other options for treatment, the patient has consented to  Procedure(s) (LRB) with comments: ESOPHAGOGASTRODUODENOSCOPY (EGD) (N/A) - 2:40 RM 1 as a surgical intervention .  The patient's history has been reviewed, patient examined, no change in status, stable for surgery.  I have reviewed the patient's chart and labs.  Questions were answered to the patient's satisfaction.     [employer]

## 2012-04-26 NOTE — Op Note (Signed)
[facility] [address] [place], [address]   ENDOSCOPY PROCEDURE REPORT  PATIENT: Marc, [name]  MR#: [id number] BIRTHDATE: 12-03-1955 , 56  yrs. old GENDER: Male  ENDOSCOPIST: Jonette Eva, MD REFERRED AO:[id number] Lodema Hong, M.D.  PROCEDURE DATE: 04/26/2012 PROCEDURE:   EGD w/ biopsy  INDICATIONS:   DYSPEPSIA ON NSAIDs MEDICATIONS: Demerol 125 mg IV, Versed 9 mg IV, and Promethazine (Phenergan) 25 mg IV TOPICAL ANESTHETIC:  DESCRIPTION OF PROCEDURE:     Physical exam was performed.  Informed consent was obtained from the patient after explaining the benefits, risks, and alternatives to the procedure.  The patient was connected to the monitor and placed in the left lateral position.  Continuous oxygen was provided by nasal cannula and IV medicine administered through an indwelling cannula.  After administration of sedation, the patients esophagus was intubated and the [id number] ([id number])  endoscope was advanced under direct visualization to the second portion of the duodenum.  The scope was removed slowly by carefully examining the color, texture, anatomy, and integrity of the mucosa on the way out.  The patient was recovered in endoscopy and discharged home in satisfactory condition.      ESOPHAGUS: The mucosa of the esophagus appeared normal.  STOMACH: Moderate non-erosive gastritis (inflammation) was found in the gastric antrum.  Multiple biopsies were performed using cold forceps.  DUODENUM: The duodenal mucosa showed no abnormalities.  Cold forcep biopsies were taken in the second portion. COMPLICATIONS:   None  ENDOSCOPIC IMPRESSION: 1.   The mucosa of the esophagus appeared normal 2.   Non-erosive gastritis (inflammation) was found in the gastric antrum; multiple biopsies 3.   The duodenal mucosa showed no abnormalities  RECOMMENDATIONS: CONTINUE OMEPRAZOLE 30 MINUTE PRIOR TO MEALS TWICE DAILY.  FOLLOW A LOW FAT DIET.  BIOPSY WILL  BE BACK IN 7 TO 10 DAYS.  YOU HAVE A FOLLOW UP IN DEC 2013.     REPEAT EXAM:   _______________________________ Rosalie DoctorJonette Eva, MD 04/26/2012 1:34 PM       PATIENT NAME:  Marc, [name] MR#: [id number]

## 2012-04-26 NOTE — H&P (View-Only) (Signed)
  Subjective:    Patient ID: Marc [name], male    DOB: 12/16/1955, 56 y.o.   MRN: [id number]  PCP: [facility]  HPI LAST SEEN SEP 2012, 161 LBS: FAILED TO SHOW FOR EGD. STILL HAVING DYSPEPSIA.  Nothing taste good. Stomach feel queazy. TAKING MLX /SENNA TO HAVE A BM. DIET HASN'T CHANGE. STRESS WORSE. NO OTHER COMPLAINTS. FEELS QUEAZY-OFF AN ON FOR 2-3 TIMES A WEEK. GETS PAIN IN RIGHT. TAKING MLX-QD, SENNA: 1XQ5 DAYS. BMs: PELLETS/LOOSE. FEELS BLOATED. PASSING GAS AND BURPING. NO HEARTBURN OR INDIGESTION. APPETITE: OK. NO BLOOD IN STOOL OR MELENA. USES ASPIRIN 1/2 325 MG & ALKA SELZTER EVERY OTHER DAY. IBUPROFEN 1X/DAY-OTC(1). ALIGN DAILY. NO CIGS/ETOH.  Past Medical History  Diagnosis Date  . Hepatitis C 1979  . Hepatitis C reactive     LIVER BX 2008-CHRONIC ACTIVE HEPATITIS  . Back problem   . GERD (gastroesophageal reflux disease)   . Depression   . IBS (irritable bowel syndrome)   . HTN (hypertension)   . Hypercholesterolemia   . Hx of adenomatous colonic polyps 2008    due for surveillance 2018  . Knee pain   . Substance abuse     narcotic addiction  . Thyroiditis     History of  . Normal echocardiogram 05/25/2011    EF 55%, mild TR  . Normal cardiac stress test 05/25/2011    [facility]    Past Surgical History  Procedure Date  . Appendectomy   . Tonsillectomy   . Carpal tunnel release   . Right ring finger   . Spinal stenosis, had screws put in neck 04/11/2009    DR KRITZER  . Upper gastrointestinal endoscopy 2008 [facility] ABD PAIN WEIGHT LOSS d100 v8 phen 12.5    NL  . Colonoscopy 2008 [facility] ARS D100 V8 PHEN 12.5    2 SIMPLE ADENOMAS (< 1 CM)  . Knee arthroscopy   . Ulnar nerve transposition       Review of Systems     Objective:   Physical Exam  Vitals reviewed. Constitutional: He is oriented to person, place, and time. He appears well-nourished. No distress.  HENT:  Head: Normocephalic and atraumatic.  Mouth/Throat: Oropharynx is clear and  moist. No oropharyngeal exudate.  Eyes: Pupils are equal, round, and reactive to light.  Neck: Normal range of motion. Neck supple.  Cardiovascular: Normal rate, regular rhythm and normal heart sounds.   Pulmonary/Chest: Effort normal and breath sounds normal. No respiratory distress.  Abdominal: Soft. Bowel sounds are normal. He exhibits no distension. There is no tenderness.  Musculoskeletal: He exhibits no edema.  Lymphadenopathy:    He has no cervical adenopathy.  Neurological: He is alert and oriented to person, place, and time.       NO  NEW FOCAL DEFICITS   Psychiatric:       NL MOOD FLAT AFFECT          Assessment & Plan:   

## 2012-04-29 ENCOUNTER — Encounter (HOSPITAL_COMMUNITY): Payer: Self-pay | Admitting: Gastroenterology

## 2012-04-29 ENCOUNTER — Ambulatory Visit (HOSPITAL_COMMUNITY)
Admission: RE | Admit: 2012-04-29 | Discharge: 2012-04-29 | Disposition: A | Discharge status: Home / Self Care | Payer: PRIVATE HEALTH INSURANCE | Source: Ambulatory Visit | Attending: Gastroenterology | Admitting: Gastroenterology

## 2012-04-29 ENCOUNTER — Other Ambulatory Visit: Payer: Self-pay | Admitting: Gastroenterology

## 2012-04-29 DIAGNOSIS — R110 Nausea: Secondary | ICD-10-CM | POA: Insufficient documentation

## 2012-04-29 DIAGNOSIS — K838 Other specified diseases of biliary tract: Secondary | ICD-10-CM | POA: Insufficient documentation

## 2012-04-29 DIAGNOSIS — R932 Abnormal findings on diagnostic imaging of liver and biliary tract: Secondary | ICD-10-CM | POA: Insufficient documentation

## 2012-04-29 DIAGNOSIS — B1920 Unspecified viral hepatitis C without hepatic coma: Secondary | ICD-10-CM | POA: Insufficient documentation

## 2012-04-29 MED ORDER — GADOBENATE DIMEGLUMINE 529 MG/ML IV SOLN
15.0000 mL | Freq: Once | INTRAVENOUS | Status: AC | PRN
  Administered 2012-04-29: 15 mL via INTRAVENOUS

## 2012-05-02 ENCOUNTER — Telehealth: Payer: Self-pay | Admitting: Gastroenterology

## 2012-05-02 ENCOUNTER — Telehealth: Payer: Self-pay | Admitting: Family Medicine

## 2012-05-02 NOTE — Telephone Encounter (Signed)
PLEASE CALL PT. His stomach Bx shows gastritis. HIS STOMACH SYMPTOMS ARE DUE TO IBS, GASTRITIS, & GERD.   HIS MRI SHOWS DILATED BILE DUCTS. HE DOES NOT HAVE STONES OR A MASS.  CONTINUE OMEPRAZOLE 30 MINUTE PRIOR TO MEALS TWICE DAILY.  FOLLOW A LOW FAT/HIGH FIBER DIET.   CONTINUE AMITIZA.  DRINK WATER TO KEEP HIS URINE LIGHT YELLOW.  FOLLOW UP IN  DEC 2013 & WE WILL RECHECK HIS LIVER ENZYMES.

## 2012-05-03 ENCOUNTER — Ambulatory Visit: Payer: PRIVATE HEALTH INSURANCE | Admitting: Family Medicine

## 2012-05-03 NOTE — Telephone Encounter (Signed)
Pt called back and he will come by to pick up the samples and he will also check with [facility].

## 2012-05-03 NOTE — Telephone Encounter (Signed)
LMOM to call.

## 2012-05-03 NOTE — Telephone Encounter (Signed)
REVIEWED.  PLEASE CALL PT.   PT SHOULD TRY [facility] TO OBTAIN PRILOSEC IF HE DOES NOT HAVE RX DRUG COVERAGE.

## 2012-05-03 NOTE — Telephone Encounter (Signed)
Faxed to PCP, recall made  

## 2012-05-03 NOTE — Telephone Encounter (Signed)
Called and informed pt. He requested samples of Prilosec. #10 left at front ( that is all we had). Mailed diets to pt.  He also said to let Dr. Darrick Penna know that he has had some difficulty swallowing. [place] it has been going on some from time to time for about a year or so. Said it doesn't bother him often. But it is like his food doesn't go quiet all the way down and then he has to try to swallow again. Please advise!

## 2012-05-12 ENCOUNTER — Ambulatory Visit (INDEPENDENT_AMBULATORY_CARE_PROVIDER_SITE_OTHER): Payer: PRIVATE HEALTH INSURANCE | Admitting: Psychiatry

## 2012-05-12 ENCOUNTER — Encounter (HOSPITAL_COMMUNITY): Payer: Self-pay | Admitting: Psychiatry

## 2012-05-12 VITALS — Wt 168.0 lb

## 2012-05-12 DIAGNOSIS — F1920 Other psychoactive substance dependence, uncomplicated: Secondary | ICD-10-CM

## 2012-05-12 DIAGNOSIS — F329 Major depressive disorder, single episode, unspecified: Secondary | ICD-10-CM

## 2012-05-12 MED ORDER — QUETIAPINE FUMARATE 50 MG PO TABS: ORAL_TABLET | ORAL | Status: DC

## 2012-05-12 MED ORDER — FLUOXETINE HCL 20 MG PO CAPS: 20.0000 mg | ORAL_CAPSULE | Freq: Every day | ORAL | Status: DC

## 2012-05-12 NOTE — Progress Notes (Signed)
Chief complaint Medication management and followup.     History of present illness Patient is a 56 year old Caucasian male who came for his followup appointment.  He is compliant with his Seroquel and Prozac.  He denies any side effects of medication.  Recently he has not seen therapist as things are going very well.  He was frustrated with his dog but lately he has no new issues.  He likes Seroquel which is helping his sleep.  He continues to have chronic back pain and taking multiple pain medication.  He scheduled to see clinician in [facility] for evaluation of a spinal stimulator.  Patient is unsure if he has appointment with neuropsychologist or neurologist.  He denies any side effects of Seroquel.  His weight remains unchanged.  He's not drinking or using any illegal substance.  He remains isolated and limited interaction with outside world.  His appetite and weight is unchanged from the past.  Current psychiatric medication Prozac 20 mg daily Seroquel 50 mg 3 times a day and 2 at bedtime.   Klonopin 0.5 mg 1-2 tablet prn prescribed by Dr. Lennie Hummer.  Past psychiatric history Patient has history of depression for many years. He has taken in the past Wellbutrin Cymbalta and Celexa. He denies any history of suicidal attempt.  Alcohol and substance use history Patient has significant history of alcohol with multiple rehabilitation and detox treatment. He has history of DWI. He also had history of taking recreational drugs and he was in the [employer]. He has history of intravenous drug use that cause hepatitis C. Patient also had history of using pain medication.  Medical history Patient has history of back pain Cervical fusion Next surgery Neck pain Hepatitis C Hypertension Benign tremors High blood pressure He see Dr. Lodema Hong and Dr. Harrel Carina for pain management.  Psychosocial history Patient was born and raised in [place].  He married twice. 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 Patient has multiple family member who has died in past few years. His father died in February 02, 2008 and mother died in February 01, 2009 3 at 2 of his sister also died.  Patient lives by himself.  Family history Patient had multiple family member who has alcohol problem.  Mental status examination Patient is casually dressed and groomed. He is tired but cooperative.  He maintained fair eye contact. He described his mood tired and his affect is constricted.  His speech is slow and he has some thought blocking. His thought process is slow but logical linear and goal-directed.  He has mild tremors which are chronic .  He denies any active or passive suicidal thinking or homicidal thinking. He denies any auditory or visual hallucination. He is alert and oriented x3.  His attention and concentration is distracted. There were no paranoia or delusions present at this time. His insight judgment and impulse control is okay.  Diagnosis Axis I Maj. depressive disorder, polysubstance dependence Axis II deferred Axis III see medical history Axis IV mild to moderate Axis V 65-70  Plan I review his history, response to the medication and last progress note.  I greeted patient is doing fairly well on Seroquel and Prozac.  He's been getting benzodiazepine from his neurologist.  I explained in detail the risk and benefits of medication especially benzodiazepine tolerance withdrawals and dependency.  Patient will inform us when he had evaluation and raised in [facility] for spinal stimulator.  I also explained that it is a possibility he  may see a new psychiatrist since I moving full-time in [place] office.  Followup in 2 months.  I also encouraged him to see therapist for coping and social skills.  Portion of this note is generated with voice dictation software and may contain typographical error.

## 2012-05-16 ENCOUNTER — Other Ambulatory Visit: Payer: Self-pay | Admitting: Family Medicine

## 2012-05-18 ENCOUNTER — Other Ambulatory Visit (HOSPITAL_COMMUNITY): Payer: Self-pay | Admitting: Psychiatry

## 2012-05-18 ENCOUNTER — Ambulatory Visit (INDEPENDENT_AMBULATORY_CARE_PROVIDER_SITE_OTHER): Payer: PRIVATE HEALTH INSURANCE | Admitting: Psychiatry

## 2012-05-18 DIAGNOSIS — F331 Major depressive disorder, recurrent, moderate: Secondary | ICD-10-CM

## 2012-05-19 NOTE — Progress Notes (Signed)
Patient:  Marc [name]   DOB: [birth year]  MR Number: [id number]  Location: [facility]:  [address] [place],  [place], [address]  Start: Monday 03/07/2012 1:10 PM End: Monday 03/07/2012 1:55 PM  Provider/Observer:     Florencia Reasons, MSW, LCSW   Chief Complaint:      Chief Complaint  Patient presents with  . Depression    Reason For Service:     The patient initially was referred by Dr. Lolly Mustache to improve coping and social skills as patient was experiencing sadness, depression, grief, and anger issues. The patient has a history of recurrent periods of depression along with a history of chemical dependency. He has had psychiatric hospitalization due to suicidal thoughts and has participated in inpatient treatment as well as outpatient treatment for chemical dependency. He completed the chemical dependency IOP program in [place] in December 2012. Patient is seen today for a follow-up appointment.  Interventions Strategy:  Supportive therapy, cognitive behavioral therapy  Participation Level:   Active  Participation Quality:  Appropriate and Sharing      Behavioral Observation:  Well Groomed, Alert, less anxious  Current Psychosocial Factors:  Patient has become the payee for one of his friends who recently was approved for disability benefits.  Content of Session:   Reviewing symptoms, processing feelings, identifying ways to set and maintain boundaries.  Current Status:   The patient reports increased sadness, decreased appetite, and isolative behaviors.. He denies any suicidal and homicidal ideations.  Patient Progress:   Fair. The patient reports increased back pain although he has been participating in a pain management program. He states being really depressed because of the pain His activity and social involvement have been inhibited. However, patient is hopeful that he will qualify feeling nerve stimulator implant for his back to reduce pain. Patient also reports  increased thoughts about his parents and his sisters, all who are deceased. Patient shares that he has become payee for one of his friends who recently was approved for disability benefits. Patient reports sometimes feeling overwhelmed with this responsibility but also feeling more productive and purposeful. He states that his parents would have liked patient doing this. He admits sometimes neglecting his own  responsibilities to help his friend manage his affairs resulting in anxiety for patient.  Therapist works with patient to identify ways to achieve balance, develop a schedule, and improve organization. Patient continues to attend AA meetings regularly.   Target Goals:    Decrease anxiety, increasing self acceptance  Last Reviewed:   08/14/2011  Goals Addressed Today:    Decrease anxiety, increasing self acceptance  Impression/Diagnosis:   The patient has a long-standing history of recurrent periods of depression and continues to experience sadness, anger, depressed mood, and poor concentration. Diagnoses: Maj. depressive disorder, recurrent  Diagnosis:  Axis I:  1. Major depressive disorder, recurrent, moderate             Axis II: Deferred

## 2012-05-19 NOTE — Patient Instructions (Signed)
Discussed orally 

## 2012-05-30 NOTE — Telephone Encounter (Signed)
Nothing has been received, needs appt

## 2012-05-31 ENCOUNTER — Other Ambulatory Visit: Payer: Self-pay | Admitting: Family Medicine

## 2012-06-03 ENCOUNTER — Other Ambulatory Visit: Payer: Self-pay | Admitting: Family Medicine

## 2012-06-13 ENCOUNTER — Ambulatory Visit (HOSPITAL_COMMUNITY): Payer: Self-pay | Admitting: Psychiatry

## 2012-06-17 ENCOUNTER — Ambulatory Visit (INDEPENDENT_AMBULATORY_CARE_PROVIDER_SITE_OTHER): Payer: PRIVATE HEALTH INSURANCE | Admitting: Psychiatry

## 2012-06-17 DIAGNOSIS — F331 Major depressive disorder, recurrent, moderate: Secondary | ICD-10-CM

## 2012-06-17 NOTE — Progress Notes (Signed)
Patient:  Marc [name]   DOB: 02/08/1956  MR Number: [id number]  Location: [facility]:  [address] [place],  [place], [address]  Start: Friday 06/17/2012 1:15 PM End: Friday 06/17/2012 2:00 PM  Provider/Observer:     Florencia Reasons, MSW, LCSW   Chief Complaint:      Chief Complaint  Patient presents with  . Depression  . Anxiety    Reason For Service:     The patient initially was referred by Dr. Lolly Mustache to improve coping and social skills as patient was experiencing sadness, depression, grief, and anger issues. The patient has a history of recurrent periods of depression along with a history of chemical dependency. He has had psychiatric hospitalization due to suicidal thoughts and has participated in inpatient treatment as well as outpatient treatment for chemical dependency. He completed the chemical dependency IOP program in [place] in December 2012. Patient is seen today for a follow-up appointment.  Interventions Strategy:  Supportive therapy, cognitive behavioral therapy  Participation Level:   Active  Participation Quality:  Appropriate and Sharing      Behavioral Observation:  Well Groomed, Alert, less anxious  Current Psychosocial Factors:  Patient has become the payee for one of his friends who recently was approved for disability benefits.  Content of Session:   Reviewing symptoms, processing feelings, identifying ways to increase social interaction  Current Status:   The patient reports improved mood, increased energy today but experiencing increased depressed mood due to being sick with the flu for 2 1/2 weeks.  Patient Progress:   Fair. The patient reports been sick a week ago with the flu and being very depressed at that time. He reports waking up today feeling better and more energetic. He displays a sense of huymor several times during session. He expresses frustration regarding his friend who recently disclosed that he is married. Patient is  contemplating no longer being payee for his friend due to trust issues and the way friend is using the disability benefits.  Patien reports increased loneliness. Therapist works with patient to identify ways to increase social involvement such as volunteering, attending church, etc Patient reports feeling better about having his dog.  Patient continues to attend AA meetings and a bible study with friends.    Target Goals:    Decrease anxiety, increasing self acceptance  Last Reviewed:   08/14/2011  Goals Addressed Today:    Decrease anxiety  Impression/Diagnosis:   The patient has a long-standing history of recurrent periods of depression and continues to experience sadness, anger, depressed mood, and poor concentration. Diagnoses: Maj. depressive disorder, recurrent  Diagnosis:  Axis I:  1. Major depressive disorder, recurrent, moderate             Axis II: Deferred

## 2012-06-17 NOTE — Patient Instructions (Signed)
Discussed orally 

## 2012-06-20 ENCOUNTER — Ambulatory Visit (INDEPENDENT_AMBULATORY_CARE_PROVIDER_SITE_OTHER): Payer: PRIVATE HEALTH INSURANCE | Admitting: Family Medicine

## 2012-06-20 ENCOUNTER — Encounter: Payer: Self-pay | Admitting: Family Medicine

## 2012-06-20 VITALS — BP 132/72 | HR 100 | Resp 18 | Ht 70.0 in | Wt 167.1 lb

## 2012-06-20 DIAGNOSIS — F3289 Other specified depressive episodes: Secondary | ICD-10-CM

## 2012-06-20 DIAGNOSIS — K219 Gastro-esophageal reflux disease without esophagitis: Secondary | ICD-10-CM

## 2012-06-20 DIAGNOSIS — F329 Major depressive disorder, single episode, unspecified: Secondary | ICD-10-CM

## 2012-06-20 DIAGNOSIS — R7309 Other abnormal glucose: Secondary | ICD-10-CM

## 2012-06-20 DIAGNOSIS — Z125 Encounter for screening for malignant neoplasm of prostate: Secondary | ICD-10-CM

## 2012-06-20 DIAGNOSIS — E559 Vitamin D deficiency, unspecified: Secondary | ICD-10-CM

## 2012-06-20 DIAGNOSIS — M549 Dorsalgia, unspecified: Secondary | ICD-10-CM

## 2012-06-20 DIAGNOSIS — M65332 Trigger finger, left middle finger: Secondary | ICD-10-CM

## 2012-06-20 DIAGNOSIS — H6120 Impacted cerumen, unspecified ear: Secondary | ICD-10-CM

## 2012-06-20 DIAGNOSIS — Z23 Encounter for immunization: Secondary | ICD-10-CM

## 2012-06-20 DIAGNOSIS — E785 Hyperlipidemia, unspecified: Secondary | ICD-10-CM

## 2012-06-20 DIAGNOSIS — M6530 Trigger finger, unspecified finger: Secondary | ICD-10-CM

## 2012-06-20 DIAGNOSIS — M79609 Pain in unspecified limb: Secondary | ICD-10-CM

## 2012-06-20 DIAGNOSIS — I10 Essential (primary) hypertension: Secondary | ICD-10-CM

## 2012-06-20 DIAGNOSIS — M79605 Pain in left leg: Secondary | ICD-10-CM | POA: Insufficient documentation

## 2012-06-20 HISTORY — DX: Trigger finger, left middle finger: M65.332

## 2012-06-20 NOTE — Progress Notes (Signed)
  Subjective:    Patient ID: Marc [name], male    DOB: 02-01-1956, 56 y.o.   MRN: [id number]  HPI Three week ago pt hit his left shin on a cofee table was seen at urgent care, and then by dr Malvin Johns. For slow healing of the sore which is still slightly red and raised, but non tender and no drainage. Pt concerned that area involved  Looks abnormal and wants an xray  Happy with therapy  Danielle Dess management is ongoing , he is in line for a nerve stimulator  Was exposed to virus from [place] had GI and respiratory symptoms  Medical info reviewed and updated, no other expressed concerns    Review of Systems    See HPI Denies recent fever or chills. Denies sinus pressure, nasal congestion, ear pain or sore throat. Denies chest congestion, productive cough or wheezing. Denies chest pains, palpitations and leg swelling Denies abdominal pain, nausea, vomiting,diarrhea or constipation.   Denies dysuria, frequency, hesitancy or incontinence.  Denies headaches, seizures, numbness, or tingling. Denies uncontrolled  depression, anxiety or insomnia. Denies current  skin break down or rash.     Objective:   Physical Exam  Patient alert and oriented and in no cardiopulmonary distress.  HEENT: No facial asymmetry, EOMI, no sinus tenderness,  oropharynx pink and moist.  Neck supple no adenopathy.Bilateral cerumen impaction  Chest: Clear to auscultation bilaterally.  CVS: S1, S2 no murmurs, no S3.  ABD: Soft non tender. Bowel sounds normal.  Ext: No edema  MS: Adequate ROM spine, shoulders, hips and knees.trigger 3rd finger left hand  Skin: Intact, no ulcerations or rash noted.  Psych: Good eye contact, normal affect. Memory intact not anxious or depressed appearing.  CNS: CN 2-12 intact, power, tone and sensation normal throughout.       Assessment & Plan:

## 2012-06-20 NOTE — Assessment & Plan Note (Signed)
Controlled, no change in medication DASH diet and commitment to daily physical activity for a minimum of 30 minutes discussed and encouraged, as a part of hypertension management. The importance of attaining a healthy weight is also discussed.  

## 2012-06-20 NOTE — Patient Instructions (Addendum)
F/u in 4 month, please call if you need me before  Flu vaccine and bilateral ear flush today.  You are referred to Dr Eulah Pont re the trigger finger.  Xray of your left leg is ordered, just go to the radiology dept please  Fasting labs Dec 29 or after, cbc, cmp, lipid, PSA , TSH , hBA1c and vit d

## 2012-06-20 NOTE — Assessment & Plan Note (Signed)
lipoma controlled when last checked updated lab needed

## 2012-07-01 ENCOUNTER — Encounter: Payer: Self-pay | Admitting: *Deleted

## 2012-07-01 DIAGNOSIS — H6120 Impacted cerumen, unspecified ear: Secondary | ICD-10-CM | POA: Insufficient documentation

## 2012-07-01 NOTE — Assessment & Plan Note (Signed)
Symptomatic trigger , refer to ortho for management

## 2012-07-01 NOTE — Assessment & Plan Note (Signed)
Continue current medication and intensive psychotherapy, not suicidal or homicidal.Also followed by psych

## 2012-07-01 NOTE — Assessment & Plan Note (Signed)
Controlled, no change in medication  

## 2012-07-01 NOTE — Assessment & Plan Note (Signed)
Chronic pain management through pain clinic ?

## 2012-07-01 NOTE — Assessment & Plan Note (Signed)
Bilateral cerumen impaction, successful ear irrigation no complications

## 2012-07-04 ENCOUNTER — Other Ambulatory Visit: Payer: Self-pay | Admitting: Family Medicine

## 2012-07-13 ENCOUNTER — Other Ambulatory Visit (HOSPITAL_COMMUNITY): Payer: Self-pay | Admitting: Psychiatry

## 2012-07-13 DIAGNOSIS — F329 Major depressive disorder, single episode, unspecified: Secondary | ICD-10-CM

## 2012-07-14 ENCOUNTER — Encounter: Payer: Self-pay | Admitting: Gastroenterology

## 2012-07-14 ENCOUNTER — Ambulatory Visit (INDEPENDENT_AMBULATORY_CARE_PROVIDER_SITE_OTHER): Payer: PRIVATE HEALTH INSURANCE | Admitting: Psychiatry

## 2012-07-14 ENCOUNTER — Encounter (HOSPITAL_COMMUNITY): Payer: Self-pay | Admitting: Psychiatry

## 2012-07-14 ENCOUNTER — Ambulatory Visit (INDEPENDENT_AMBULATORY_CARE_PROVIDER_SITE_OTHER): Payer: PRIVATE HEALTH INSURANCE | Admitting: Gastroenterology

## 2012-07-14 VITALS — BP 120/84 | HR 60 | Ht 70.0 in | Wt 165.2 lb

## 2012-07-14 VITALS — BP 114/74 | HR 73 | Temp 97.4°F | Ht 70.0 in | Wt 166.0 lb

## 2012-07-14 DIAGNOSIS — K589 Irritable bowel syndrome without diarrhea: Secondary | ICD-10-CM

## 2012-07-14 DIAGNOSIS — R1011 Right upper quadrant pain: Secondary | ICD-10-CM

## 2012-07-14 DIAGNOSIS — F1920 Other psychoactive substance dependence, uncomplicated: Secondary | ICD-10-CM

## 2012-07-14 DIAGNOSIS — R1013 Epigastric pain: Secondary | ICD-10-CM

## 2012-07-14 DIAGNOSIS — G4700 Insomnia, unspecified: Secondary | ICD-10-CM

## 2012-07-14 DIAGNOSIS — K3189 Other diseases of stomach and duodenum: Secondary | ICD-10-CM

## 2012-07-14 DIAGNOSIS — F329 Major depressive disorder, single episode, unspecified: Secondary | ICD-10-CM

## 2012-07-14 DIAGNOSIS — K838 Other specified diseases of biliary tract: Secondary | ICD-10-CM

## 2012-07-14 DIAGNOSIS — F1910 Other psychoactive substance abuse, uncomplicated: Secondary | ICD-10-CM

## 2012-07-14 DIAGNOSIS — R1010 Upper abdominal pain, unspecified: Secondary | ICD-10-CM | POA: Insufficient documentation

## 2012-07-14 HISTORY — DX: Right upper quadrant pain: R10.11

## 2012-07-14 HISTORY — DX: Epigastric pain: R10.13

## 2012-07-14 HISTORY — DX: Other specified diseases of biliary tract: K83.8

## 2012-07-14 MED ORDER — QUETIAPINE FUMARATE 50 MG PO TABS: ORAL_TABLET | ORAL | Status: DC

## 2012-07-14 MED ORDER — DEXLANSOPRAZOLE 60 MG PO CPDR: 60.0000 mg | DELAYED_RELEASE_CAPSULE | Freq: Every day | ORAL | Status: DC

## 2012-07-14 MED ORDER — LUBIPROSTONE 24 MCG PO CAPS: 24.0000 ug | ORAL_CAPSULE | Freq: Two times a day (BID) | ORAL | Status: DC

## 2012-07-14 MED ORDER — FLUOXETINE HCL 20 MG PO CAPS: 20.0000 mg | ORAL_CAPSULE | Freq: Every day | ORAL | Status: DC

## 2012-07-14 NOTE — Progress Notes (Signed)
Chief complaint Chief Complaint  Patient presents with  . Depression  . Follow-up  . Medication Refill   History of present illness Patient is a 56 year old Caucasian male who came for his followup appointment.  Pt reports that he is going to be getting a spinal stimulator for his chronic pain.  He has stopped the Neurontin as he has undergone ulnar transposition and the neurologist wants to just keep him on the Klonopin.  He notes some problems with his memory on the Klonopin.  He is an alcoholic now on a benzo.  Current psychiatric medication Prozac 20 mg daily Seroquel 50 mg 3 times a day and 2 at bedtime.   Klonopin 0.5 mg 1-2 tablet prn prescribed by Dr. Lennie Hummer.  Past psychiatric history Patient has history of depression for many years. He has taken in the past Wellbutrin Cymbalta and Celexa. He denies any history of suicidal attempt.  Alcohol and substance use history Patient has significant history of alcohol with multiple rehabilitation and detox treatment. He has history of DWI. He also had history of taking recreational drugs and he was in the [employer]. He has history of intravenous drug use that cause hepatitis C. Patient also had history of using pain medication.  Medical history Patient has history of back pain Cervical fusion Next surgery Neck pain Hepatitis C Hypertension Benign tremors High blood pressure He sees Dr. Lodema Hong and Dr. Harrel Carina for pain management.  Psychosocial history Patient was born and raised in [place].  He married twice. 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 Patient has multiple family member who has died in past few years. His father died in 01/02/2008 and mother died in Jan 01, 2009 3 at 2 of his sister also died.  Patient lives by himself.  Family history Patient had multiple family member who has alcohol problem.  Mental status examination Patient is casually dressed and groomed. He is tired but cooperative.   He maintained fair eye contact. He described his mood tired and his affect is constricted.  His speech is slow and he has some thought blocking. His thought process is slow but logical linear and goal-directed.  He has mild tremors which are chronic .  He denies any active or passive suicidal thinking or homicidal thinking. He denies any auditory or visual hallucination. He is alert and oriented x3.  His attention and concentration is distracted. There were no paranoia or delusions present at this time. His insight judgment and impulse control is okay.  Diagnosis Axis I Maj. depressive disorder, polysubstance dependence Axis II deferred Axis III see medical history Axis IV mild to moderate Axis V 65-70  Plan I took her vitals.  I reviewed CC, tobacco/med/surg Hx, meds effects/ side effects, problem list, therapies and responses as well as current situation/symptoms discussed options. See orders and pt instructions for more details.

## 2012-07-14 NOTE — Progress Notes (Signed)
Faxed to PCP

## 2012-07-14 NOTE — Assessment & Plan Note (Signed)
Increase amitiza to bid. Samples and rx provided. Align one daily, samples provided.

## 2012-07-14 NOTE — Patient Instructions (Addendum)
Please have your blood work done today. Please stop Prilosec (omeprazole). Start Dexilant 60mg  daily before breakfast. Samples provided and prescription sent to your pharmacy. Stop Amitiza tablet. Start Amitiza twice a day with food. Samples provided and prescription sent to your pharmacy. Align one daily for two weeks. Samples provided. Follow-up office visit with Dr. Darrick Penna in 4-6 weeks.

## 2012-07-14 NOTE — Patient Instructions (Signed)
Do follow up on the Vitamin D blood level.  Keep the meditation up.

## 2012-07-14 NOTE — Progress Notes (Signed)
Primary Care Physician: Syliva Overman, MD  Primary Gastroenterologist:  Jonette Eva, MD   Chief Complaint  Patient presents with  . Irritable Bowel Syndrome    HPI: Marc [name] is a 56 y.o. male here Last seen in office on 04/06/2012 by Dr. Darrick Penna. C/O nausea, constipation, right sided abdominal pain. He has h/o GERD, dyspepsia, Hep C (nonresponder), IBS. Since last OV he had EGD 04/26/12 with Dr. Darrick Penna which showed gastritis. Bx negative for H.Pylori and Celiac. Abd U/S showed CBD 13mm, previously normal on CT in 2008. He also had biliary sludge. Liver appeared normal. F/U MRCP showed diffuse biliary ductal dilatation with CBD 13mm. No choledocholithiasis. GB unremarkable. Pancreas unremarkable. Liver normal.   Epigastric burning uncontrolled lately. Lot of stress. Amitiza not helping. Taking Senna and gets better results. Taking omeprazole 20mg  BID. Not able to eat low-fat diet, not appealing. Not hungry. Eats pretzels for breakfast. For about one month having more symptoms. Tries to keep up with Align but hard due to expense. On duragesic patch for back. Hydrocodone for breakthrough pain. Takes ibuprofen 200mg  QD to BID. Had been on neurontin for right arm nerve pain, ran out on Monday, hasn't got refill yet. Rarely takes miralax.  Trial neuron transmitter for chronic pain to be placed on 07/27/12.   Current Outpatient Prescriptions  Medication Sig Dispense Refill  . clonazePAM (KLONOPIN) 0.5 MG tablet Take 0.5 mg by mouth 2 (two) times daily as needed. For anxiety      . cloNIDine (CATAPRES) 0.1 MG tablet TAKE (1) TABLET BY MOUTH TWICE DAILY.  60 tablet  3  . fentaNYL (DURAGESIC - DOSED MCG/HR) 75 MCG/HR Place 1 patch onto the skin every 3 (three) days.      Marland Kitchen FLUoxetine (PROZAC) 20 MG capsule Take 1 capsule (20 mg total) by mouth daily.  30 capsule  1  . HYDROcodone-acetaminophen (VICODIN ES) 7.5-750 MG per tablet Take 1 tablet by mouth 3 (three) times daily.      Marland Kitchen ibuprofen  (ADVIL,MOTRIN) 200 MG tablet Take 600 mg by mouth every 6 (six) hours as needed. For pain      . lubiprostone (AMITIZA) 8 MCG capsule Take 8 mcg by mouth See admin instructions. Take one tablet at bedtime for 7 days, then continue to take 1 tablet twice daily. Starting on 04/12/12      . MEVACOR 40 MG tablet TAKE (1) TABLET BY MOUTH AT BEDTIME.  30 tablet  4  . NORVASC 5 MG tablet TAKE ONE TABLET BY MOUTH ONCE DAILY.  30 tablet  2  . omeprazole (PRILOSEC OTC) 20 MG tablet Take 1 tablet (20 mg total) by mouth 2 (two) times daily.  60 tablet  3  . polyethylene glycol (MIRALAX / GLYCOLAX) packet Take 17 g by mouth daily as needed. For constipation      . Probiotic Product (ALIGN PO) Take 1 capsule by mouth daily.       . QUEtiapine (SEROQUEL) 50 MG tablet Take one tablet three times daily, and 2 tablet at bedtime.  150 tablet  1  . ROBAXIN-750 750 MG tablet TAKE (1) TABLET BY MOUTH (3) TIMES DAILY.  90 tablet  0    Allergies as of 07/14/2012 - Review Complete 07/14/2012  Allergen Reaction Noted  . Levofloxacin      ROS:  General: Negative for anorexia, weight loss, fever, chills, fatigue, weakness. ENT: Negative for hoarseness, difficulty swallowing , nasal congestion. CV: Negative for chest pain, angina, palpitations, dyspnea on exertion, peripheral  edema.  Respiratory: Negative for dyspnea at rest, dyspnea on exertion, cough, sputum, wheezing.  GI: See history of present illness. GU:  Negative for dysuria, hematuria, urinary incontinence, urinary frequency, nocturnal urination.  Endo: Negative for unusual weight change.    Physical Examination:   BP 114/74  Pulse 73  Temp 97.4 F (36.3 C) (Oral)  Ht 5\' 10"  (1.778 m)  Wt 166 lb (75.297 kg)  BMI 23.82 kg/m2  General: Well-nourished, well-developed in no acute distress.  Eyes: No icterus. Mouth: Oropharyngeal mucosa moist and pink , no lesions erythema or exudate. Lungs: Clear to auscultation bilaterally.  Heart: Regular rate and  rhythm, no murmurs rubs or gallops.  Abdomen: Bowel sounds are normal, mild epig/ruq tenderness, nondistended, no hepatosplenomegaly or masses, no abdominal bruits or hernia , no rebound or guarding.   Extremities: No lower extremity edema. No clubbing or deformities. Neuro: Alert and oriented x 4   Skin: Warm and dry, no jaundice.   Psych: Alert and cooperative, normal mood and affect.

## 2012-07-14 NOTE — Assessment & Plan Note (Signed)
Recurrent epigastric/ruq pain with h/o dyspepsia, non-erosive gastritis, new dilated biliary tree. He is taking ibuprofen 400mg  daily. States he takes his omeprazole BID on regular basis. C/o clay-colored stool for one to two weeks. Recommend check LFTs/lipase/cbc at this time. Change omeprazole to Dexilant. See patient instructions. Keep ov with Dr. Darrick Penna in 4-6 weeks.

## 2012-07-15 ENCOUNTER — Other Ambulatory Visit: Payer: Self-pay

## 2012-07-15 DIAGNOSIS — R7401 Elevation of levels of liver transaminase levels: Secondary | ICD-10-CM

## 2012-07-15 LAB — CBC WITH DIFFERENTIAL/PLATELET
Basophils Absolute: 0 10*3/uL (ref 0.0–0.1)
Basophils Relative: 0 % (ref 0–1)
Eosinophils Absolute: 0 10*3/uL (ref 0.0–0.7)
MCH: 32.1 pg (ref 26.0–34.0)
MCHC: 35 g/dL (ref 30.0–36.0)
Neutrophils Relative %: 60 % (ref 43–77)
Platelets: 147 10*3/uL — ABNORMAL LOW (ref 150–400)
RDW: 13.4 % (ref 11.5–15.5)

## 2012-07-15 LAB — HEPATIC FUNCTION PANEL
AST: 102 U/L — ABNORMAL HIGH (ref 0–37)
Albumin: 4.7 g/dL (ref 3.5–5.2)
Total Bilirubin: 0.7 mg/dL (ref 0.3–1.2)

## 2012-07-15 NOTE — Progress Notes (Signed)
Quick Note:  Called and informed pt of results/recommendations. He said he started the Dexilant yesterday and the Amitiza this AM. He ate a banana before he took the Amitiza, and that is all he has eaten today. But about 30 min after taking the Amitiza he felt stiff all over and it hasn't gone away. He also has a headache and is very nervous. Please advise! ______

## 2012-07-15 NOTE — Progress Notes (Signed)
Quick Note:  Discussed with Dr. Jena Gauss in Dr. Evelina Dun absence.  Patient has had abd u/s and MRCP for biliary dilation.  AST/ALT elevation could be secondary to chronic HCV. Plan as outlined in OV. Repeat LFTs next Wednesday to see if trending upward. ______

## 2012-07-15 NOTE — Progress Notes (Signed)
Quick Note:  Previously tolerated Amitiza . Doubt symptoms are related to either Dexilant or Amitiza.  Stop Amitiza for now. Take Benadryl 25mg  po now. If symptoms worsen, go to ED. Continue Dexilant. Call Monday with PR. ______

## 2012-07-15 NOTE — Progress Notes (Signed)
Quick Note:  Called and informed pt. ______ 

## 2012-07-18 ENCOUNTER — Ambulatory Visit (INDEPENDENT_AMBULATORY_CARE_PROVIDER_SITE_OTHER): Payer: PRIVATE HEALTH INSURANCE | Admitting: Psychiatry

## 2012-07-18 DIAGNOSIS — F331 Major depressive disorder, recurrent, moderate: Secondary | ICD-10-CM

## 2012-07-18 NOTE — Progress Notes (Signed)
Quick Note:  Lab order was faxed to [facility] on 07/15/2012. ______

## 2012-07-19 NOTE — Progress Notes (Signed)
Patient:  Marc [name]   DOB: 1956/01/24  MR Number: [id number]  Location: [facility]:  [address] [place],  [place], [address]  Start: Monday 07/18/2012 1:10 PM End: Monday 07/18/2012 1:55 PM  Provider/Observer:     Florencia Reasons, MSW, LCSW   Chief Complaint:      Chief Complaint  Patient presents with  . Depression    Reason For Service:     The patient initially was referred by Dr. Lolly Mustache to improve coping and social skills as patient was experiencing sadness, depression, grief, and anger issues. The patient has a history of recurrent periods of depression along with a history of chemical dependency. He has had psychiatric hospitalization due to suicidal thoughts and has participated in inpatient treatment as well as outpatient treatment for chemical dependency. He completed the chemical dependency IOP program in [place] in December 2012. Patient is seen today for a follow-up appointment.  Interventions Strategy:  Supportive therapy, cognitive behavioral therapy  Participation Level:   Active  Participation Quality:  Appropriate and Sharing      Behavioral Observation:  Well Groomed, Alert, less anxious  Current Psychosocial Factors:   Content of Session:   Reviewing symptoms, processing feelings, discussing spirituality and identifying ways to strengthen  Current Status:   The patient reports depressed mood, poor motivation, and increased anxiety  Patient Progress:   Fair. The patient reports continued health issues and now is faced with making a decision to either participate in a program to discontinue pain medication or to have a neurotransmitter placed in his body to manage pain per patient's report. Patient expresses anxiety about making a decision but plans to do more research on his options. Patient also reports increased stress related to recent interaction with the IRS regarding paying taxes as well as constant calls from his aunt who has bipolar  disorder. He also reports continued stress regarding being payee for his friend and states he has not yet made a decision as to whether or not he will continue as payee. Patient states feeling overwhelmed and often not doing anything due to poor motivation. Patient also reports increased loneliness and increased thoughts about his deceased parents as well as a male friend who moved to [place] last year. Therapist works with patient to process his feelings and to identify ways to prioritize as well as organize. Therapist and patient also discuss tools such as journaling and using lists. Patient and therapist discuss patient spirituality. Patient plans to resume his practice of doing a daily devotional as this has been helpful.   Target Goals:    Decrease anxiety, increasing self acceptance  Last Reviewed:   08/14/2011  Goals Addressed Today:    Decrease anxiety  Impression/Diagnosis:   The patient has a long-standing history of recurrent periods of depression and continues to experience sadness, anger, depressed mood, and poor concentration. Diagnoses: Maj. depressive disorder, recurrent  Diagnosis:  Axis I:  1. Major depressive disorder, recurrent, moderate             Axis II: Deferred

## 2012-07-19 NOTE — Patient Instructions (Signed)
Discussed orally 

## 2012-07-20 LAB — HEPATIC FUNCTION PANEL
Albumin: 4.4 g/dL (ref 3.5–5.2)
Alkaline Phosphatase: 99 U/L (ref 39–117)
Bilirubin, Direct: 0.2 mg/dL (ref 0.0–0.3)
Indirect Bilirubin: 0.4 mg/dL (ref 0.0–0.9)
Total Bilirubin: 0.6 mg/dL (ref 0.3–1.2)

## 2012-07-21 ENCOUNTER — Telehealth: Payer: Self-pay | Admitting: *Deleted

## 2012-07-21 NOTE — Telephone Encounter (Signed)
LMOM to call.

## 2012-07-21 NOTE — Progress Notes (Signed)
Quick Note:  AST/ALT stable. Likely secondary to HCV. Will discuss with [facility] upon her return. ______

## 2012-07-21 NOTE — Telephone Encounter (Signed)
I called pt. He said he woke up with joint and muscle pain this Am and did not want to get up. He wants to know if it could be coming from the Amitiza. I reviewed his last instructions with him which included Stop Amitiza. He had forgotten about it and started back on it. He said he has so much to keep up with that he forgets. He said the joint and muscle pain this AM made it difficulty to walk, but this afternoon he is feeling better. I told him not to take Amitiza, as previously recommended by Tana Coast, PA , and we will get back with him.

## 2012-07-21 NOTE — Telephone Encounter (Signed)
Pt returned call and was informed. He did his labs on 07/20/2012.

## 2012-07-21 NOTE — Telephone Encounter (Signed)
Mr Cott called today. He is having joint and muscle pain and is wondering if it is a side effect from his medication. Please call him back. Thanks.

## 2012-07-21 NOTE — Telephone Encounter (Signed)
I think he should see his PCP for these symptoms if they do not go away after stopping the Amitiza.   He was also supposed to have LFTs this week.

## 2012-07-22 NOTE — Telephone Encounter (Signed)
PLEASE CALL PT.  HE SHOULD STOP TAKING AMITIZA. HE SHOULD USE MOM PILLS OR LIQUID 2 OR 3 TIMES A DAY TO HAVE 1-2 BMs A WEEK.HE WILL FOLLOW UP WITH DR. Magdeline Prange IN 4-6 WEEKS E30 CONSTIPATION.

## 2012-07-22 NOTE — Progress Notes (Addendum)
RECORDS REVIEWED. PT LIKELY HAS IBS-C AND SX EXACERBATED BY NARCOTIC USE. UNABLE TO TOLERATE AMITIZA. CONSIDER RELASTOR.   ELEVATED LIVER ENZYMES DUE TO HCV. NO CIRRHOSIS ON IMAGING. PT HAD RECENT RCP. CONSIDER EUS TO EVALUATE HOP. MRCP MAY MISS SMALL LESIONS.  REVIEWED.

## 2012-07-25 NOTE — Telephone Encounter (Signed)
Called and informed pt. Routing to [place] to schedule OV appt.

## 2012-07-28 ENCOUNTER — Other Ambulatory Visit: Payer: Self-pay | Admitting: Family Medicine

## 2012-07-28 NOTE — Progress Notes (Signed)
Please let patient know. Discussed his AST/ALT elevation with Dr. Darrick Penna. Likely due to HCV; however, given prior evidence of dilated Common Bile Duct on U/S and MRCP few months ago, she recommends EUS to evaluate abdominal pain, biliary dilation, head of pancreas.  Please arrange EUS.

## 2012-07-29 ENCOUNTER — Other Ambulatory Visit: Payer: Self-pay | Admitting: Family Medicine

## 2012-08-01 NOTE — Progress Notes (Signed)
Called and informed pt. Routing to Soledad Gerlach to schedule. He is aware it might be tomorrow before she calls.

## 2012-08-02 NOTE — Progress Notes (Signed)
I spoke with patient and he is aware.

## 2012-08-02 NOTE — Progress Notes (Signed)
Referral has been sent to [facility] with Dr. Christella Hartigan and she will arrange appointment with patient

## 2012-08-04 ENCOUNTER — Telehealth: Payer: Self-pay

## 2012-08-04 DIAGNOSIS — K838 Other specified diseases of biliary tract: Secondary | ICD-10-CM

## 2012-08-04 NOTE — Telephone Encounter (Signed)
Message copied by Donata Duff on Thu Aug 04, 2012  8:07 AM ------      Message from: Rob Bunting P      Created: Thu Aug 04, 2012  7:01 AM      Regarding: RE: EUS       He needs upper EUS, radial +/- Linear, ++ propofol, next avail non hosp week Thursday.  For dilated CBD.  Thanks                              ----- Message -----         From: Donata Duff, CMA         Sent: 08/02/2012   8:22 AM           To: Rachael Fee, MD      Subject: Annell Greening: EUS                                                              ----- Message -----         From: Glendora Score         Sent: 08/02/2012   8:13 AM           To: Donata Duff, CMA      Subject: EUS                                                      Tana Coast, PA  07/28/2012  2:06 PM  Signed      Please let patient know. Discussed his AST/ALT elevation with Dr. Darrick Penna. Likely due to HCV; however, given prior evidence of dilated Common Bile Duct on U/S and MRCP few months ago, she recommends EUS to evaluate abdominal pain, biliary dilation, head of pancreas.                  Thanks,       Soledad Gerlach

## 2012-08-05 ENCOUNTER — Other Ambulatory Visit: Payer: Self-pay

## 2012-08-05 ENCOUNTER — Other Ambulatory Visit: Payer: Self-pay | Admitting: Family Medicine

## 2012-08-05 DIAGNOSIS — K838 Other specified diseases of biliary tract: Secondary | ICD-10-CM

## 2012-08-05 NOTE — Telephone Encounter (Signed)
EUS 09/08/12

## 2012-08-05 NOTE — Telephone Encounter (Signed)
Left message on machine to call back  

## 2012-08-05 NOTE — Telephone Encounter (Signed)
Pt has been scheduled and instructed meds have been reviewed and pt will call with any further concerns or questions

## 2012-08-08 ENCOUNTER — Encounter: Payer: Self-pay | Admitting: Gastroenterology

## 2012-08-08 LAB — CBC
HCT: 39.2 % (ref 39.0–52.0)
Hemoglobin: 13.4 g/dL (ref 13.0–17.0)
MCH: 31.8 pg (ref 26.0–34.0)
MCHC: 34.2 g/dL (ref 30.0–36.0)

## 2012-08-08 LAB — COMPREHENSIVE METABOLIC PANEL
Albumin: 4.3 g/dL (ref 3.5–5.2)
Alkaline Phosphatase: 91 U/L (ref 39–117)
BUN: 20 mg/dL (ref 6–23)
Glucose, Bld: 98 mg/dL (ref 70–99)
Potassium: 4.7 mEq/L (ref 3.5–5.3)

## 2012-08-08 LAB — HEMOGLOBIN A1C
Hgb A1c MFr Bld: 5.6 % (ref <5.7)
Mean Plasma Glucose: 114 mg/dL (ref <117)

## 2012-08-08 LAB — LIPID PANEL
Cholesterol: 155 mg/dL (ref 0–200)
LDL Cholesterol: 66 mg/dL (ref 0–99)
Triglycerides: 86 mg/dL (ref <150)

## 2012-08-17 ENCOUNTER — Ambulatory Visit (INDEPENDENT_AMBULATORY_CARE_PROVIDER_SITE_OTHER): Payer: PRIVATE HEALTH INSURANCE | Admitting: Psychiatry

## 2012-08-17 DIAGNOSIS — F331 Major depressive disorder, recurrent, moderate: Secondary | ICD-10-CM

## 2012-08-17 NOTE — Patient Instructions (Addendum)
Write questions for consultation with doctor regarding neurotransmitter Continue to attend Bible Study Continue to attend AA Groups Resume journaling  Use relaxation techniques - playing guitar, listen to music, watch a movie, visit friend

## 2012-08-17 NOTE — Progress Notes (Addendum)
Patient:  Marc [name]   DOB: Nov 16, 1955  MR Number: [id number]  Location: [facility]:  [address] [place] [place],  [place], [address]  Start: Wednesday 08/17/2012 1:10 PM End: Wednesday 08/17/2012 1:55 PM  Provider/Observer:     Florencia Reasons, MSW, LCSW   Chief Complaint:      Chief Complaint  Patient presents with  . Anxiety  . Depression    Reason For Service:     The patient initially was referred by Dr. Lolly Mustache to improve coping and social skills as patient was experiencing sadness, depression, grief, and anger issues. The patient has a history of recurrent periods of depression along with a history of chemical dependency. He has had psychiatric hospitalization due to suicidal thoughts and has participated in inpatient treatment as well as outpatient treatment for chemical dependency. He completed the chemical dependency IOP program in [place] in December 2012. Patient is seen today for a follow-up appointment.  Interventions Strategy:  Supportive therapy, cognitive behavioral therapy  Participation Level:   Active  Participation Quality:  Appropriate and Sharing      Behavioral Observation:  Well Groomed, Alert, less anxious  Current Psychosocial Factors: Patient's cousin recently was hospitalized.  Content of Session:   Reviewing symptoms, processing feelings, identifying coping and relaxation techniques  Current Status:   The patient reports depressed mood and anxiety  Patient Progress:   Fair. The patient reports experiencing decreased pain while participating in a trial for a neurotransmitter for his back. However, he expresses concern that the process may have not been done incorrectly after researching information on the Internet. Therapist works with patient to process his feelings and encourages patient to write his questions for his consultation with the surgeon  Who will be performing his procedure. The patient also has concerns about an upcoming ultrasound  and endoscopy. Patient also reports sadness related to his cousin recently being hospitalized. Therapist works with patient to process his feelings. Patient and therapist also discussed coping and relaxation techniques    Target Goals:    Decrease anxiety, increasing self acceptance  Last Reviewed:   08/14/2011  Goals Addressed Today:    Decrease anxiety  Impression/Diagnosis:   The patient has a long-standing history of recurrent periods of depression and continues to experience sadness, anger, depressed mood, and poor concentration. Diagnoses: Maj. depressive disorder, recurrent  Diagnosis:  Axis I:  1. Major depressive disorder, recurrent, moderate             Axis II: Deferred

## 2012-08-23 ENCOUNTER — Other Ambulatory Visit (HOSPITAL_COMMUNITY): Payer: Self-pay | Admitting: Physical Medicine and Rehabilitation

## 2012-08-23 ENCOUNTER — Encounter (HOSPITAL_COMMUNITY): Payer: Self-pay | Admitting: *Deleted

## 2012-08-23 ENCOUNTER — Ambulatory Visit (HOSPITAL_COMMUNITY)
Admission: RE | Admit: 2012-08-23 | Discharge: 2012-08-23 | Disposition: A | Discharge status: Home / Self Care | Payer: PRIVATE HEALTH INSURANCE | Source: Ambulatory Visit | Attending: Physical Medicine and Rehabilitation | Admitting: Physical Medicine and Rehabilitation

## 2012-08-23 DIAGNOSIS — M25569 Pain in unspecified knee: Secondary | ICD-10-CM | POA: Insufficient documentation

## 2012-08-23 DIAGNOSIS — M545 Low back pain, unspecified: Secondary | ICD-10-CM | POA: Insufficient documentation

## 2012-08-23 DIAGNOSIS — T07XXXA Unspecified multiple injuries, initial encounter: Secondary | ICD-10-CM | POA: Insufficient documentation

## 2012-08-23 DIAGNOSIS — M542 Cervicalgia: Secondary | ICD-10-CM | POA: Insufficient documentation

## 2012-08-23 DIAGNOSIS — W19XXXA Unspecified fall, initial encounter: Secondary | ICD-10-CM

## 2012-08-23 NOTE — Pre-Procedure Instructions (Signed)
Your procedure is scheduled [id number], September 08, 2012 Report to [facility] Admitting [id number] Call this number if you have problems morning of your procedure:[phone]  Follow all bowel prep instructions per your doctor's orders.  Do not eat or drink anything after midnight the night before your procedure. You may brush your teeth, rinse out your mouth, but no water, no food, no chewing gum, no mints, no candies, no chewing tobacco.     Take these medicines the morning of your procedure with A SIP OF WATER: Clonidine,Dexilant,Prozac,Norvasc,Seroquel and may take Hydrocodone if needed for pain   Please make arrangements for a responsible person to drive you home after the procedure. You cannot go home by cab/taxi. We recommend you have someone with you at home the first 24 hours after your procedure. Driver for procedure is friend Marc [name] [phone]  LEAVE ALL VALUABLES, JEWELRY, BILLFOLD AT HOME.  NO DENTURES, CONTACT LENSES ALLOWED IN THE ENDOSCOPY ROOM.   YOU MAY WEAR DEODORANT, PLEASE REMOVE ALL JEWELRY, WATCHES RINGS, BODY PIERCINGS AND LEAVE AT HOME.   WOMEN: NO MAKE-UP, LOTIONS PERFUMES

## 2012-08-25 ENCOUNTER — Telehealth: Payer: Self-pay | Admitting: Family Medicine

## 2012-08-25 ENCOUNTER — Other Ambulatory Visit: Payer: Self-pay | Admitting: Family Medicine

## 2012-08-25 ENCOUNTER — Encounter: Payer: Self-pay | Admitting: Gastroenterology

## 2012-08-25 ENCOUNTER — Ambulatory Visit (INDEPENDENT_AMBULATORY_CARE_PROVIDER_SITE_OTHER): Payer: PRIVATE HEALTH INSURANCE | Admitting: Gastroenterology

## 2012-08-25 VITALS — BP 124/76 | HR 77 | Temp 98.4°F | Ht 70.0 in | Wt 167.0 lb

## 2012-08-25 DIAGNOSIS — K219 Gastro-esophageal reflux disease without esophagitis: Secondary | ICD-10-CM

## 2012-08-25 DIAGNOSIS — K838 Other specified diseases of biliary tract: Secondary | ICD-10-CM

## 2012-08-25 DIAGNOSIS — B182 Chronic viral hepatitis C: Secondary | ICD-10-CM

## 2012-08-25 DIAGNOSIS — K589 Irritable bowel syndrome without diarrhea: Secondary | ICD-10-CM

## 2012-08-25 NOTE — Telephone Encounter (Signed)
pls see result note with abnormal labs on this pt , this is very important. Also pls fax the d/c order and call opharmacy. Pls also order rept hepatic panel pls give me follow up

## 2012-08-25 NOTE — Progress Notes (Signed)
Faxed to PCP

## 2012-08-25 NOTE — Assessment & Plan Note (Signed)
HFP NOV 2013-MILDLY ELEVATED ALT > AST  WILL INFORM PT IF REGIMEN W/O INTERFERON BECOMES AVAILABLE.

## 2012-08-25 NOTE — Progress Notes (Signed)
Subjective:    Patient ID: Marc [name], male    DOB: 10/10/1955, 57 y.o.   MRN: [id number]  HQI:[id number]  HPI LOST APPETITE. HAVING CONSTIPATION. MO AGO STOOLS DARK BROWN. AND IN THE LAST FEW DAYS TAN. WILL GET CONSTIPATION AND TAKES SENNA. BMs: EVERY 3 TIMES Q4-5 DAYS. DRINKING WATER. EATING FIBER. MAY NOT EAT UNTIL MID-DAY. MILD NAUSEA FROM HIS BACK AND WHEN HE'S CONSTIPATED. DOESN'T HAVE ABD PAIN WHEN HE'S CONSTIPATED &  RIGHT BEFORE IT LET'S LOOSE. PAIN BETTER AFTER BMs. LOST 1 LBS SINCE AUG 2013. FEELS HEARTBURN: 4X/WEEK. GETS WITH EATING AND TAKING DEXILANT.  PT DENIES FEVER, CHILLS, BRBPR, vomiting, melena, constipation, problems swallowing..  Past Medical History  Diagnosis Date  . Hepatitis C reactive     LIVER BX 2008-CHRONIC ACTIVE HEPATITIS  . Back problem   . GERD (gastroesophageal reflux disease)   . IBS (irritable bowel syndrome)   . HTN (hypertension)   . Hypercholesterolemia   . Hx of adenomatous colonic polyps 2008    due for surveillance 2018  . Knee pain   . Substance abuse     narcotic addiction  . Thyroiditis     History of  . Normal echocardiogram 05/25/2011    EF 55%, mild TR  . Normal cardiac stress test 05/25/2011    [facility] [place]  . Depression   . Hepatitis C 1979   Past Surgical History  Procedure Date  . Appendectomy   . Tonsillectomy   . Carpal tunnel release   . Right ring finger   . Spinal stenosis, had screws put in neck 04/11/2009    DR KRITZER  . Upper gastrointestinal endoscopy 2008 [facility] ABD PAIN WEIGHT LOSS d100 v8 phen 12.5    NL  . Colonoscopy 2008 [facility] ARS D100 V8 PHEN 12.5    2 SIMPLE ADENOMAS (< 1 CM)  . Knee arthroscopy   . Ulnar nerve transposition   . Esophagogastroduodenoscopy 04/26/2012    [facility]: Non-erosive gastritis (inflammation) was found in the gastric antrum but no H.pylori; multiple biopsies (duodenal bx negative for Celiac)/ The mucosa of the esophagus appeared normal   Allergies  Allergen Reactions    . Levofloxacin Nausea Only and Other (See Comments)    "Creepy" feeling, skin didn't feel right, sort of itchy.  Valinda Hoar (Lubiprostone)     MADE HIM FEEL STIFF    Current Outpatient Prescriptions  Medication Sig Dispense Refill  . clonazePAM (KLONOPIN) 0.5 MG tablet Take 0.5 mg by mouth 2 (two) times daily as needed. For anxiety      . cloNIDine (CATAPRES) 0.1 MG tablet TAKE (1) TABLET BY MOUTH TWICE DAILY.    Marland Kitchen dexlansoprazole (DEXILANT) 60 MG capsule Take 1 capsule (60 mg total) by mouth daily.    . fentaNYL (DURAGESIC - DOSED MCG/HR) 75 MCG/HR Place 1 patch onto the skin every 3 (three) days.    Marland Kitchen FLUoxetine (PROZAC) 20 MG capsule Take 1 capsule (20 mg total) by mouth daily.    Marland Kitchen gabapentin (NEURONTIN) 300 MG capsule Take 300 mg by mouth 3 (three) times daily.    Marland Kitchen HYDROcodone-acetaminophen (VICODIN ES) 7.5-750 MG per tablet Take 1 tablet by mouth 3 (three) times daily.    Marland Kitchen ibuprofen (ADVIL,MOTRIN) 200 MG tablet Take 600 mg by mouth every 6 (six) hours as needed. For pain    . MEVACOR 40 MG tablet TAKE (1) TABLET BY MOUTH AT BEDTIME.    . NORVASC 5 MG tablet TAKE ONE TABLET BY MOUTH ONCE  DAILY.    .      . Probiotic Product (ALIGN PO) Take 1 capsule by mouth daily.     . QUEtiapine (SEROQUEL) 50 MG tablet Take one tablet three times daily, and 2 tablet at bedtime.    Marland Kitchen ROBAXIN-750 750 MG tablet TAKE (1) TABLET BY MOUTH (3) TIMES DAILY.    Marland Kitchen           Review of Systems     Objective:   Physical Exam  Vitals reviewed. Constitutional: He is oriented to person, place, and time. No distress.       APPEARS SLEEPY  HENT:  Head: Normocephalic and atraumatic.  Mouth/Throat: Oropharynx is clear and moist. No oropharyngeal exudate.  Eyes: Pupils are equal, round, and reactive to light. No scleral icterus.  Cardiovascular: Normal rate, regular rhythm and normal heart sounds.   Pulmonary/Chest: Effort normal and breath sounds normal. No respiratory distress.  Abdominal: Soft.  Bowel sounds are normal. He exhibits distension (MILD). There is no tenderness. There is no rebound and no guarding.  Musculoskeletal: He exhibits no edema.  Neurological: He is alert and oriented to person, place, and time.       NO  NEW FOCAL DEFICITS   Psychiatric:       FLAT AFFECT, NL MOOD           Assessment & Plan:

## 2012-08-25 NOTE — Assessment & Plan Note (Signed)
UNABLE TO TOLERATE AMITIZA. RESPONDS TO SENNA.  USE SENNA DAILY. DRINK WATER EAT FIBER OPV IN 4 MOS

## 2012-08-25 NOTE — Assessment & Plan Note (Signed)
SX CONTROLLED WITH DEXILANT.  CONTINUE DEXILANT.

## 2012-08-25 NOTE — Patient Instructions (Signed)
  USE SENNA DAILY.  DRINK WATER TO KEEP URINE LIGHT YELLOW.  EAT FIBER. SEE INFO BELOW.  FOLLOW UP IN 4 MOS.  High-Fiber Diet A high-fiber diet changes your normal diet to include more whole grains, legumes, fruits, and vegetables. Changes in the diet involve replacing refined carbohydrates with unrefined foods. The calorie level of the diet is essentially unchanged. The Dietary Reference Intake (recommended amount) for adult males is 38 grams per day. For adult females, it is 25 grams per day. Pregnant and lactating women should consume 28 grams of fiber per day. Fiber is the intact part of a plant that is not broken down during digestion. Functional fiber is fiber that has been isolated from the plant to provide a beneficial effect in the body. PURPOSE  Increase stool bulk.   Ease and regulate bowel movements.   Lower cholesterol.  INDICATIONS THAT YOU NEED MORE FIBER  Constipation and hemorrhoids.   Uncomplicated diverticulosis (intestine condition) and irritable bowel syndrome.   Weight management.   As a protective measure against hardening of the arteries (atherosclerosis), diabetes, and cancer.   DO NOT USE WITH:  Acute diverticulitis (intestine infection).   Partial small bowel obstructions.   Complicated diverticular disease involving bleeding, rupture (perforation), or abscess (boil, furuncle).   Presence of autonomic neuropathy (nerve damage) or gastroparesis (stomach cannot empty itself).    GUIDELINES FOR INCREASING FIBER IN THE DIET  Start adding fiber to the diet slowly. A gradual increase of about 5 more grams (2 slices of whole-wheat bread, 2 servings of most fruits or vegetables, or 1 bowl of high-fiber cereal) per day is best. Too rapid an increase in fiber may result in constipation, flatulence, and bloating.   Drink enough water and fluids to keep your urine clear or pale yellow. Water, juice, or caffeine-free drinks are recommended. Not drinking enough  fluid may cause constipation.   Eat a variety of high-fiber foods rather than one type of fiber.   Try to increase your intake of fiber through using high-fiber foods rather than fiber pills or supplements that contain small amounts of fiber.   The goal is to change the types of food eaten. Do not supplement your present diet with high-fiber foods, but replace foods in your present diet.    INCLUDE A VARIETY OF FIBER SOURCES  Replace refined and processed grains with whole grains, canned fruits with fresh fruits, and incorporate other fiber sources. White rice, white breads, and most bakery goods contain little or no fiber.   Brown whole-grain rice, buckwheat oats, and many fruits and vegetables are all good sources of fiber. These include: broccoli, Brussels sprouts, cabbage, cauliflower, beets, sweet potatoes, white potatoes (skin on), carrots, tomatoes, eggplant, squash, berries, fresh fruits, and dried fruits.   Cereals appear to be the richest source of fiber. Cereal fiber is found in whole grains and bran. Bran is the fiber-rich outer coat of cereal grain, which is largely removed in refining. In whole-grain cereals, the bran remains. In breakfast cereals, the largest amount of fiber is found in those with "bran" in their names. The fiber content is sometimes indicated on the label.   You may need to include additional fruits and vegetables each day.   In baking, for 1 cup white flour, you may use the following substitutions:   1 cup whole-wheat flour minus 2 tablespoons.   1/2 cup white flour plus 1/2 cup whole-wheat flour.

## 2012-08-25 NOTE — Assessment & Plan Note (Signed)
NO SEROLOGIC EVIDENCE FOR BILI ARY OBSTRUCTION.  EUS PENDING. WILL AWAIT RESULTS.

## 2012-08-26 ENCOUNTER — Other Ambulatory Visit: Payer: Self-pay

## 2012-08-26 ENCOUNTER — Telehealth: Payer: Self-pay | Admitting: Family Medicine

## 2012-08-26 MED ORDER — ERGOCALCIFEROL 1.25 MG (50000 UT) PO CAPS: 50000.0000 [IU] | ORAL_CAPSULE | ORAL | Status: DC

## 2012-08-26 NOTE — Addendum Note (Signed)
Addended by: Abner Greenspan on: 08/26/2012 08:40 AM   Modules accepted: Orders

## 2012-08-26 NOTE — Telephone Encounter (Signed)
Done and pt aware

## 2012-08-26 NOTE — Telephone Encounter (Signed)
Patient aware of what he is to do regarding meds

## 2012-08-30 ENCOUNTER — Telehealth: Payer: Self-pay | Admitting: Gastroenterology

## 2012-08-31 ENCOUNTER — Encounter (HOSPITAL_COMMUNITY): Payer: Self-pay | Admitting: Pharmacy Technician

## 2012-08-31 ENCOUNTER — Ambulatory Visit: Payer: Self-pay | Admitting: Pain Medicine

## 2012-08-31 NOTE — Telephone Encounter (Signed)
The pt was confused because he has several appointments and called the wrong office today.  I asked the pt to call at any time he has any questions or concerns regarding his appt with Dr Christella Hartigan

## 2012-08-31 NOTE — Telephone Encounter (Signed)
Left message on machine to call back  

## 2012-08-31 NOTE — Progress Notes (Signed)
Pt is aware of OV on 12/08/12 at 3pm with SF and appt card was mailed

## 2012-09-01 ENCOUNTER — Ambulatory Visit (HOSPITAL_COMMUNITY)
Admission: RE | Admit: 2012-09-01 | Discharge: 2012-09-01 | Disposition: A | Discharge status: Home / Self Care | Payer: PRIVATE HEALTH INSURANCE | Source: Ambulatory Visit | Attending: Physical Medicine and Rehabilitation | Admitting: Physical Medicine and Rehabilitation

## 2012-09-01 ENCOUNTER — Other Ambulatory Visit (HOSPITAL_COMMUNITY): Payer: Self-pay | Admitting: Physical Medicine and Rehabilitation

## 2012-09-01 DIAGNOSIS — M546 Pain in thoracic spine: Secondary | ICD-10-CM

## 2012-09-08 ENCOUNTER — Ambulatory Visit (HOSPITAL_COMMUNITY): Payer: PRIVATE HEALTH INSURANCE | Admitting: Certified Registered Nurse Anesthetist

## 2012-09-08 ENCOUNTER — Encounter (HOSPITAL_COMMUNITY): Payer: Self-pay | Admitting: Certified Registered Nurse Anesthetist

## 2012-09-08 ENCOUNTER — Encounter (HOSPITAL_COMMUNITY)
Admission: RE | Disposition: A | Discharge status: Home / Self Care | Payer: Self-pay | Source: Ambulatory Visit | Attending: Gastroenterology

## 2012-09-08 ENCOUNTER — Ambulatory Visit (HOSPITAL_COMMUNITY)
Admission: RE | Admit: 2012-09-08 | Discharge: 2012-09-08 | Disposition: A | Discharge status: Home / Self Care | Payer: PRIVATE HEALTH INSURANCE | Source: Ambulatory Visit | Attending: Gastroenterology | Admitting: Gastroenterology

## 2012-09-08 ENCOUNTER — Encounter (HOSPITAL_COMMUNITY): Payer: Self-pay | Admitting: Gastroenterology

## 2012-09-08 DIAGNOSIS — K5900 Constipation, unspecified: Secondary | ICD-10-CM | POA: Insufficient documentation

## 2012-09-08 DIAGNOSIS — R7402 Elevation of levels of lactic acid dehydrogenase (LDH): Secondary | ICD-10-CM | POA: Insufficient documentation

## 2012-09-08 DIAGNOSIS — I10 Essential (primary) hypertension: Secondary | ICD-10-CM | POA: Insufficient documentation

## 2012-09-08 DIAGNOSIS — B1920 Unspecified viral hepatitis C without hepatic coma: Secondary | ICD-10-CM | POA: Insufficient documentation

## 2012-09-08 DIAGNOSIS — Z79899 Other long term (current) drug therapy: Secondary | ICD-10-CM | POA: Insufficient documentation

## 2012-09-08 DIAGNOSIS — E7800 Pure hypercholesterolemia, unspecified: Secondary | ICD-10-CM | POA: Insufficient documentation

## 2012-09-08 DIAGNOSIS — K589 Irritable bowel syndrome without diarrhea: Secondary | ICD-10-CM | POA: Insufficient documentation

## 2012-09-08 DIAGNOSIS — K219 Gastro-esophageal reflux disease without esophagitis: Secondary | ICD-10-CM | POA: Insufficient documentation

## 2012-09-08 DIAGNOSIS — R7401 Elevation of levels of liver transaminase levels: Secondary | ICD-10-CM | POA: Insufficient documentation

## 2012-09-08 DIAGNOSIS — K838 Other specified diseases of biliary tract: Secondary | ICD-10-CM | POA: Insufficient documentation

## 2012-09-08 HISTORY — PX: EUS: SHX5427

## 2012-09-08 SURGERY — UPPER ENDOSCOPIC ULTRASOUND (EUS) LINEAR
Anesthesia: Monitor Anesthesia Care

## 2012-09-08 MED ORDER — SODIUM CHLORIDE 0.9 % IV SOLN: INTRAVENOUS | Status: DC

## 2012-09-08 MED ORDER — ONDANSETRON HCL 4 MG/2ML IJ SOLN
INTRAMUSCULAR | Status: DC | PRN
  Administered 2012-09-08: 4 mg via INTRAVENOUS

## 2012-09-08 MED ORDER — FENTANYL CITRATE 0.05 MG/ML IJ SOLN
INTRAMUSCULAR | Status: DC | PRN
  Administered 2012-09-08 (×2): 50 ug via INTRAVENOUS

## 2012-09-08 MED ORDER — PROPOFOL INFUSION 10 MG/ML OPTIME
INTRAVENOUS | Status: DC | PRN
  Administered 2012-09-08: 160 ug/kg/min via INTRAVENOUS

## 2012-09-08 MED ORDER — MIDAZOLAM HCL 5 MG/5ML IJ SOLN
INTRAMUSCULAR | Status: DC | PRN
  Administered 2012-09-08: 2 mg via INTRAVENOUS

## 2012-09-08 MED ORDER — KETAMINE HCL 10 MG/ML IJ SOLN
INTRAMUSCULAR | Status: DC | PRN
Start: 2012-09-08 — End: 2012-09-08
  Administered 2012-09-08: 20 mg via INTRAVENOUS

## 2012-09-08 MED ORDER — LIDOCAINE HCL (CARDIAC) 20 MG/ML IV SOLN
INTRAVENOUS | Status: DC | PRN
  Administered 2012-09-08: 100 mg via INTRAVENOUS

## 2012-09-08 MED ORDER — LACTATED RINGERS IV SOLN
INTRAVENOUS | Status: DC
  Administered 2012-09-08: 11:00:00 via INTRAVENOUS

## 2012-09-08 MED ORDER — BUTAMBEN-TETRACAINE-BENZOCAINE 2-2-14 % EX AERO
INHALATION_SPRAY | CUTANEOUS | Status: DC | PRN
  Administered 2012-09-08: 2 via TOPICAL

## 2012-09-08 NOTE — Anesthesia Preprocedure Evaluation (Addendum)
Anesthesia Evaluation  Patient identified by MRN, date of birth, ID band Patient awake    Reviewed: Allergy & Precautions, H&P , NPO status , Patient's Chart, lab work & pertinent test results  Airway Mallampati: II TM Distance: >3 FB Neck ROM: full    Dental No notable dental hx. (+) Dental Advisory Given   Pulmonary neg pulmonary ROS,  breath sounds clear to auscultation  Pulmonary exam normal       Cardiovascular Exercise Tolerance: Good hypertension, Pt. on medications negative cardio ROS  Rhythm:regular Rate:Normal     Neuro/Psych Depression negative neurological ROS  negative psych ROS   GI/Hepatic negative GI ROS, Neg liver ROS, GERD-  Medicated and Controlled,(+)     substance abuse   , Hepatitis -, CChronic active hep C.  Narcotics addiction   Endo/Other  negative endocrine ROS  Renal/GU negative Renal ROS  negative genitourinary   Musculoskeletal   Abdominal   Peds  Hematology negative hematology ROS (+)   Anesthesia Other Findings   Reproductive/Obstetrics negative OB ROS                          Anesthesia Physical Anesthesia Plan  ASA: III  Anesthesia Plan: MAC   Post-op Pain Management:    Induction:   Airway Management Planned:   Additional Equipment:   Intra-op Plan:   Post-operative Plan:   Informed Consent: I have reviewed the patients History and Physical, chart, labs and discussed the procedure including the risks, benefits and alternatives for the proposed anesthesia with the patient or authorized representative who has indicated his/her understanding and acceptance.   Dental Advisory Given  Plan Discussed with: CRNA and Surgeon  Anesthesia Plan Comments:         Anesthesia Quick Evaluation

## 2012-09-08 NOTE — Interval H&P Note (Signed)
History and Physical Interval Note:  09/08/2012 10:50 AM  Marc [name]  has presented today for surgery, with the diagnosis of Dilated cbd, acquired [576.8]  The various methods of treatment have been discussed with the patient and family. After consideration of risks, benefits and other options for treatment, the patient has consented to  Procedure(s) (LRB) with comments: UPPER ENDOSCOPIC ULTRASOUND (EUS) LINEAR (N/A) as a surgical intervention .  The patient's history has been reviewed, patient examined, no change in status, stable for surgery.  I have reviewed the patient's chart and labs.  Questions were answered to the patient's satisfaction.     Rob Bunting

## 2012-09-08 NOTE — Preoperative (Signed)
Beta Blockers   Reason not to administer Beta Blockers:Not Applicable 

## 2012-09-08 NOTE — Op Note (Signed)
[facility] [address] [place], [address]   ENDOSCOPIC ULTRASOUND PROCEDURE REPORT  PATIENT: Marc [name], Marc [name]  MR#: [id number] BIRTHDATE: [birth year]  GENDER: Male ENDOSCOPIST: Rachael Fee, MD REFERRED BY:  Jonette Eva, MD PROCEDURE DATE:  09/08/2012 PROCEDURE:   Upper EUS ASA CLASS:      Class II INDICATIONS:   Dilated bile duct, elevated transaminases (has Hep C); T bili and alk phos normal; no clear biliary type pains. MEDICATIONS: MAC sedation, administered by CRNA  DESCRIPTION OF PROCEDURE:   After the risks benefits and alternatives of the procedure were  explained, informed consent was obtained. The patient was then placed in the left, lateral, decubitus postion and IV sedation was administered. Throughout the procedure, the patients blood pressure, pulse and oxygen saturations were monitored continuously.  Under direct visualization, the Pentax Radial EUS [id number]  endoscope was introduced through the mouth  and advanced to the second portion of the duodenum .  Water was used as necessary to provide an acoustic interface.  Upon completion of the imaging, water was removed and the patient was sent to the recovery room in satisfactory condition.  Endoscopic findings (with radial echoendoscope and side viewing duodenoscope): 1. Normal UGI tract including normal major papilla.  No periampullary diverticulum  EUS findings: 1. CBD was dilated up to 12mm, tapered smoothly into the major papilla. No CBD, bile duct stones.  No soft tissue within bile duct. 2. Normal pancreatic parenchyma without discrete masses or signs of chronic pancreatitis 3. No peripancreatic adenopathy 4. Main pancreatic duct is slightly dilated in head of pancreas but tapers to normal appearing duct in neck, body, tail. 5. Normal gallbladder 6. Limited views of liver, spleen, portal and splenic vessels were all normal  Impression: Incidentally dilated CBD without any  clear causative pathology. This may be from Sphincter of Oddi stenosis, dysfuction.  Given pattern of LFT elevation (not cholestatic or obstructive appearing) and no typical biliary symptoms I think simply observing him clinically is reasonable.   _______________________________ eSigned:  Rachael Fee, MD 09/08/2012 11:24 AM

## 2012-09-08 NOTE — Anesthesia Postprocedure Evaluation (Signed)
  Anesthesia Post-op Note  Patient: Marc [name]  Procedure(s) Performed: Procedure(s) (LRB): UPPER ENDOSCOPIC ULTRASOUND (EUS) LINEAR (N/A)  Patient Location: PACU  Anesthesia Type: MAC  Level of Consciousness: awake and alert   Airway and Oxygen Therapy: Patient Spontanous Breathing  Post-op Pain: mild  Post-op Assessment: Post-op Vital signs reviewed, Patient's Cardiovascular Status Stable, Respiratory Function Stable, Patent Airway and No signs of Nausea or vomiting  Last Vitals:  Filed Vitals:   09/08/12 1145  BP: 119/75  Pulse:   Temp:   Resp: 18    Post-op Vital Signs: stable   Complications: No apparent anesthesia complications

## 2012-09-08 NOTE — Transfer of Care (Signed)
Immediate Anesthesia Transfer of Care Note  Patient: Marc [name]  Procedure(s) Performed: Procedure(s) (LRB): UPPER ENDOSCOPIC ULTRASOUND (EUS) LINEAR (N/A)  Patient Location: PACU  Anesthesia Type: MAC  Level of Consciousness: sedated, patient cooperative and responds to stimulaton  Airway & Oxygen Therapy: Patient Spontanous Breathing and Patient connected to face mask oxgen  Post-op Assessment: Report given to PACU RN and Post -op Vital signs reviewed and stable  Post vital signs: Reviewed and stable  Complications: No apparent anesthesia complications

## 2012-09-08 NOTE — H&P (View-Only) (Signed)
Subjective:    Patient ID: Marc [name], male    DOB: 07/06/1956, 57 y.o.   MRN: [id number]  PCP:[facility]  HPI LOST APPETITE. HAVING CONSTIPATION. MO AGO STOOLS DARK BROWN. AND IN THE LAST FEW DAYS TAN. WILL GET CONSTIPATION AND TAKES SENNA. BMs: EVERY 3 TIMES Q4-5 DAYS. DRINKING WATER. EATING FIBER. MAY NOT EAT UNTIL MID-DAY. MILD NAUSEA FROM HIS BACK AND WHEN HE'S CONSTIPATED. DOESN'T HAVE ABD PAIN WHEN HE'S CONSTIPATED &  RIGHT BEFORE IT LET'S LOOSE. PAIN BETTER AFTER BMs. LOST 1 LBS SINCE AUG 2013. FEELS HEARTBURN: 4X/WEEK. GETS WITH EATING AND TAKING DEXILANT.  PT DENIES FEVER, CHILLS, BRBPR, vomiting, melena, constipation, problems swallowing..  Past Medical History  Diagnosis Date  . Hepatitis C reactive     LIVER BX 2008-CHRONIC ACTIVE HEPATITIS  . Back problem   . GERD (gastroesophageal reflux disease)   . IBS (irritable bowel syndrome)   . HTN (hypertension)   . Hypercholesterolemia   . Hx of adenomatous colonic polyps 2008    due for surveillance 2018  . Knee pain   . Substance abuse     narcotic addiction  . Thyroiditis     History of  . Normal echocardiogram 05/25/2011    EF 55%, mild TR  . Normal cardiac stress test 05/25/2011    [facility]  . Depression   . Hepatitis C 1979   Past Surgical History  Procedure Date  . Appendectomy   . Tonsillectomy   . Carpal tunnel release   . Right ring finger   . Spinal stenosis, had screws put in neck 04/11/2009    DR KRITZER  . Upper gastrointestinal endoscopy 2008 [facility] ABD PAIN WEIGHT LOSS d100 v8 phen 12.5    NL  . Colonoscopy 2008 [facility] ARS D100 V8 PHEN 12.5    2 SIMPLE ADENOMAS (< 1 CM)  . Knee arthroscopy   . Ulnar nerve transposition   . Esophagogastroduodenoscopy 04/26/2012    [facility]: Non-erosive gastritis (inflammation) was found in the gastric antrum but no H.pylori; multiple biopsies (duodenal bx negative for Celiac)/ The mucosa of the esophagus appeared normal   Allergies  Allergen Reactions    . Levofloxacin Nausea Only and Other (See Comments)    "Creepy" feeling, skin didn't feel right, sort of itchy.  . Amitiza (Lubiprostone)     MADE HIM FEEL STIFF    Current Outpatient Prescriptions  Medication Sig Dispense Refill  . clonazePAM (KLONOPIN) 0.5 MG tablet Take 0.5 mg by mouth 2 (two) times daily as needed. For anxiety      . cloNIDine (CATAPRES) 0.1 MG tablet TAKE (1) TABLET BY MOUTH TWICE DAILY.    . dexlansoprazole (DEXILANT) 60 MG capsule Take 1 capsule (60 mg total) by mouth daily.    . fentaNYL (DURAGESIC - DOSED MCG/HR) 75 MCG/HR Place 1 patch onto the skin every 3 (three) days.    . FLUoxetine (PROZAC) 20 MG capsule Take 1 capsule (20 mg total) by mouth daily.    . gabapentin (NEURONTIN) 300 MG capsule Take 300 mg by mouth 3 (three) times daily.    . HYDROcodone-acetaminophen (VICODIN ES) 7.5-750 MG per tablet Take 1 tablet by mouth 3 (three) times daily.    . ibuprofen (ADVIL,MOTRIN) 200 MG tablet Take 600 mg by mouth every 6 (six) hours as needed. For pain    . MEVACOR 40 MG tablet TAKE (1) TABLET BY MOUTH AT BEDTIME.    . NORVASC 5 MG tablet TAKE ONE TABLET BY MOUTH ONCE   DAILY.    .      . Probiotic Product (ALIGN PO) Take 1 capsule by mouth daily.     . QUEtiapine (SEROQUEL) 50 MG tablet Take one tablet three times daily, and 2 tablet at bedtime.    . ROBAXIN-750 750 MG tablet TAKE (1) TABLET BY MOUTH (3) TIMES DAILY.    .           Review of Systems     Objective:   Physical Exam  Vitals reviewed. Constitutional: He is oriented to person, place, and time. No distress.       APPEARS SLEEPY  HENT:  Head: Normocephalic and atraumatic.  Mouth/Throat: Oropharynx is clear and moist. No oropharyngeal exudate.  Eyes: Pupils are equal, round, and reactive to light. No scleral icterus.  Cardiovascular: Normal rate, regular rhythm and normal heart sounds.   Pulmonary/Chest: Effort normal and breath sounds normal. No respiratory distress.  Abdominal: Soft.  Bowel sounds are normal. He exhibits distension (MILD). There is no tenderness. There is no rebound and no guarding.  Musculoskeletal: He exhibits no edema.  Neurological: He is alert and oriented to person, place, and time.       NO  NEW FOCAL DEFICITS   Psychiatric:       FLAT AFFECT, NL MOOD           Assessment & Plan:   

## 2012-09-09 ENCOUNTER — Encounter (HOSPITAL_COMMUNITY): Payer: Self-pay | Admitting: Gastroenterology

## 2012-09-12 ENCOUNTER — Telehealth (HOSPITAL_COMMUNITY): Payer: Self-pay | Admitting: Psychiatry

## 2012-09-12 DIAGNOSIS — F3289 Other specified depressive episodes: Secondary | ICD-10-CM

## 2012-09-12 DIAGNOSIS — F329 Major depressive disorder, single episode, unspecified: Secondary | ICD-10-CM

## 2012-09-14 ENCOUNTER — Telehealth (HOSPITAL_COMMUNITY): Payer: Self-pay | Admitting: Psychiatry

## 2012-09-14 ENCOUNTER — Ambulatory Visit (INDEPENDENT_AMBULATORY_CARE_PROVIDER_SITE_OTHER): Payer: PRIVATE HEALTH INSURANCE | Admitting: Psychiatry

## 2012-09-14 DIAGNOSIS — F331 Major depressive disorder, recurrent, moderate: Secondary | ICD-10-CM

## 2012-09-14 MED ORDER — FLUOXETINE HCL 20 MG PO CAPS: 20.0000 mg | ORAL_CAPSULE | Freq: Every day | ORAL | Status: DC

## 2012-09-14 NOTE — Progress Notes (Addendum)
Patient:  Marc [name]   DOB: 09-04-1955  MR Number: [id number]  Location: [facility]:  [address] [place] [place],  [place], [address]  Start: Wednesday 09/14/2012 1:20 PM End: Wednesday 09/14/2012 1:55 PM  Provider/Observer:     Florencia Reasons, MSW, LCSW   Chief Complaint:      Chief Complaint  Patient presents with  . Depression  . Anxiety    Reason For Service:     The patient initially was referred by Dr. Lolly Mustache to improve coping and social skills as patient was experiencing sadness, depression, grief, and anger issues. The patient has a history of recurrent periods of depression along with a history of chemical dependency. He has had psychiatric hospitalization due to suicidal thoughts and has participated in inpatient treatment as well as outpatient treatment for chemical dependency. He completed the chemical dependency IOP program in [place] in December 2012. Patient is seen today for a follow-up appointment.  Interventions Strategy:  Supportive therapy, cognitive behavioral therapy  Participation Level:   Active  Participation Quality:  Appropriate and Sharing      Behavioral Observation:  Casual , lethargic, speech was slow  Current Psychosocial Factors: Patient reports increased financial issues.  Content of Session:   Reviewing symptoms, processing feelings, identifying coping and relaxation techniques  Current Status:   The patient reports increased depressed mood, loss of appetite, decreased interest in activities,anxiety and sleep difficulty ( sleeping about 4 hours per night).  He denies any thoughts of harming self but reports sometimes praying God would take him.  Patient Progress:   Fair. The patient reports falling 3 or 4 weeks ago resulting in increased pain initially for patient. He also reports having an endoscopy last week and having a reaction to the anesthesia including headaches and fatigue. Patient reports increased depressed mood and staying in  bed most of the time. He also reports increased irritability regarding his dog. Per patient's report, he has maintained abstinence from alcohol and illicit drug use ans has continued to attend AA meetings. Patient also is experiencing increased worry regarding financial issues as his expenses exceed his income. Patient also reports not having  his medication for the past 3 or 4 days but has requested a refill. Therapist works with patient on problem solving and explore resources regarding financial issues. Therapist also works with patient to identify ways to improve self-care and increase involvement in activity. Therapist also works with patient to identify ways to increase light exposure.   Target Goals:    Decrease anxiety, increasing self acceptance  Last Reviewed:   08/14/2011  Goals Addressed Today:    Decrease anxiety  Impression/Diagnosis:   The patient has a long-standing history of recurrent periods of depression and continues to experience sadness, anger, depressed mood, and poor concentration. Diagnoses: Maj. depressive disorder, recurrent  Diagnosis:  Axis I:  1. Major depressive disorder, recurrent, moderate             Axis II: Deferred

## 2012-09-14 NOTE — Telephone Encounter (Signed)
Refill request approved via eScripts.  

## 2012-09-14 NOTE — Patient Instructions (Signed)
Discussed orally 

## 2012-09-19 ENCOUNTER — Telehealth (HOSPITAL_COMMUNITY): Payer: Self-pay | Admitting: Psychiatry

## 2012-09-20 NOTE — Telephone Encounter (Signed)
S/W pharmacist who says that the script written on Dec 5th had 1 refil.  Epic seems to think that he has enough.  I verbally approved enough until his appointment on Mar 5th.

## 2012-10-11 ENCOUNTER — Ambulatory Visit (HOSPITAL_COMMUNITY): Payer: Self-pay | Admitting: Psychiatry

## 2012-10-12 ENCOUNTER — Encounter (HOSPITAL_COMMUNITY): Payer: Self-pay | Admitting: Psychiatry

## 2012-10-12 ENCOUNTER — Ambulatory Visit (INDEPENDENT_AMBULATORY_CARE_PROVIDER_SITE_OTHER): Payer: PRIVATE HEALTH INSURANCE | Admitting: Psychiatry

## 2012-10-12 VITALS — Wt 161.0 lb

## 2012-10-12 DIAGNOSIS — F329 Major depressive disorder, single episode, unspecified: Secondary | ICD-10-CM

## 2012-10-12 DIAGNOSIS — G4700 Insomnia, unspecified: Secondary | ICD-10-CM

## 2012-10-12 MED ORDER — FLUOXETINE HCL 20 MG PO CAPS: 20.0000 mg | ORAL_CAPSULE | Freq: Every day | ORAL | Status: DC

## 2012-10-12 MED ORDER — QUETIAPINE FUMARATE 50 MG PO TABS: ORAL_TABLET | ORAL | Status: DC

## 2012-10-12 NOTE — Patient Instructions (Signed)
Call if problems or concerns.  

## 2012-10-12 NOTE — Progress Notes (Signed)
[facility] [address] Progress Note Marc [name] MRN: [id number] DOB: 1956/04/30 Age: 57 y.o.  Date: 10/12/2012 Start Time: 2:05 PM End Time: 2:25 PM  Chief Complaint: Chief Complaint  Patient presents with  . Depression  . Follow-up  . Medication Refill   Subjective: "I'm hanging by a thread". Depression 7/10 and Anxiety 6/10, where 1 is the best and 10 is the worst.  Pain today is 6/10.  History of present illness Patient is a 57 year old Caucasian male who came for his followup appointment.  Pt reports that he is going to be getting a spinal stimulator for his chronic pain.  Pt reports that he is compliant with the psychotropic medications with fair benefit and some side effects.  Sometimes he feels a little hyper with the Prozac.  Current psychiatric medication Prozac 20 mg in AM Seroquel 50 mg 3 times a day and 2 at bedtime.   Klonopin 0.5 mg 1-2 tablet prn prescribed by Dr. Lennie Hummer.  Past psychiatric history Patient has history of depression for many years. He has taken in the past Wellbutrin Cymbalta and Celexa. He denies any history of suicidal attempt.  Alcohol and substance use history Patient has significant history of alcohol with multiple rehabilitation and detox treatment. He has history of DWI. He also had history of taking recreational drugs and he was in the [employer]. He has history of intravenous drug use that cause hepatitis C. Patient also had history of using pain medication.  Medical history Patient has history of back pain Cervical fusion Next surgery Neck pain Hepatitis C Hypertension Benign tremors High blood pressure He sees Dr. Lodema Hong and Dr. Harrel Carina for pain management.  Psychosocial history Patient was born and raised in [place].  He married twice. 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 Patient has multiple family member who has died in past few years. His father died in 12/31/2007 and mother died in  Dec 30, 2008 3 at 2 of his sister also died.  Patient lives by himself.  Family history Patient had multiple family member who has alcohol problem.  Mental status examination Patient is casually dressed and groomed. He is tired but cooperative.  He maintained fair eye contact. He described his mood tired and his affect is constricted.  His speech is slow and he has some thought blocking. His thought process is slow but logical linear and goal-directed.  He has mild tremors which are chronic .  He denies any active or passive suicidal thinking or homicidal thinking. He denies any auditory or visual hallucination. He is alert and oriented x3.  His attention and concentration is distracted. There were no paranoia or delusions present at this time. His insight judgment and impulse control is okay.  Diagnosis Axis I Maj. depressive disorder, polysubstance dependence Axis II deferred Axis III see medical history Axis IV mild to moderate Axis V 65-70  Plan/Discussion: I took his vitals.  I reviewed CC, tobacco/med/surg Hx, meds effects/ side effects, problem list, therapies and responses as well as current situation/symptoms discussed options. Continue current effective medications. See orders and pt instructions for more details.  Medical Decision Making Problem Points:  Established problem, stable/improving (1), Review of last therapy session (1) and Review of psycho-social stressors (1) Data Points:  Review or order clinical lab tests (1) Review of medication regiment & side effects (2)  I certify that outpatient services furnished can reasonably be expected to improve the patient's condition.   WALKER,  EDWIN, MD, [facility]

## 2012-10-14 NOTE — Progress Notes (Signed)
EUS JAN 2014 NL-AST/ALT elevation DUE to chronic HCV.  REVIEWED.

## 2012-10-17 ENCOUNTER — Ambulatory Visit (INDEPENDENT_AMBULATORY_CARE_PROVIDER_SITE_OTHER): Payer: PRIVATE HEALTH INSURANCE | Admitting: Psychiatry

## 2012-10-17 DIAGNOSIS — F339 Major depressive disorder, recurrent, unspecified: Secondary | ICD-10-CM

## 2012-10-18 NOTE — Progress Notes (Signed)
Patient:  Marc [name]   DOB: 27-Apr-1956  MR Number: [id number]  Location: [facility]:  [address] [place],  [place], [address]  Start: Monday 10/17/2012 2:10 PM End: Monday 10/17/2012 2:55 PM  Provider/Observer:     Florencia Reasons, MSW, LCSW   Chief Complaint:      Chief Complaint  Patient presents with  . Depression  . Anxiety    Reason For Service:     The patient initially was referred by Dr. Lolly Mustache to improve coping and social skills as patient was experiencing sadness, depression, grief, and anger issues. The patient has a history of recurrent periods of depression along with a history of chemical dependency. He has had psychiatric hospitalization due to suicidal thoughts and has participated in inpatient treatment as well as outpatient treatment for chemical dependency. He completed the chemical dependency IOP program in [place] in December 2012. Patient is seen today for a follow-up appointment.  Interventions Strategy:  Supportive therapy, cognitive behavioral therapy  Participation Level:   Active  Participation Quality:  Appropriate and Sharing      Behavioral Observation:  Casual ,   Current Psychosocial Factors: Patient reports recent death of a former co-worker, increased financial issues and resuming a relationship with a friend who he fears may have cancer  Content of Session:   Reviewing symptoms, processing feelings, identifying ways to set and maintain boundaries  Current Status:   The patient reports less depressed, increased involvement in activities, but continued anxiety.  He denies any thoughts of harming self but reports occasionally praying God would take him. Patient also reports increased memory difficulty and losing several items like his keys.  Patient Progress:   Fair. The patient reports continued financial issues and expresses frustration with himself for trading a car with no payments for another vehicle that now has payments.  Patient reports this was an impulsive purchase. He has talked with personnel from the car dealership to see if something can be done to lower his payments and is waiting for an answer. He also reports a former coworker died. He expresses appropriate sadness regarding this. He reports resuming his relationship with his male friend who he fears has cancer. Patient reports being glad to be involved in the relationship as he no longer is lonely. Therapist works with patient to process his feelings and to identify boundary issues. Patient reports decreased irritability regarding caring for his dog and reports being more active by taking his dog walking. Patient maintains abstinence from illicit drug use and continues to attend NA meetings per patient's report. He also continues to attend Bible study with his friends. Patient also reports feeling better as he is experiencing decreased pain intensity in his back.    Target Goals:    Decrease anxiety, increasing self acceptance  Last Reviewed:   08/14/2011  Goals Addressed Today:    Decrease anxiety  Impression/Diagnosis:   The patient has a long-standing history of recurrent periods of depression and continues to experience sadness, anger, depressed mood, and poor concentration. Diagnoses: Maj. depressive disorder, recurrent  Diagnosis:  Axis I:  MDD (major depressive disorder), recurrent episode          Axis II: Deferred

## 2012-10-18 NOTE — Patient Instructions (Signed)
Discussed orally 

## 2012-10-25 ENCOUNTER — Other Ambulatory Visit: Payer: Self-pay | Admitting: Family Medicine

## 2012-10-27 ENCOUNTER — Ambulatory Visit: Payer: PRIVATE HEALTH INSURANCE | Admitting: Family Medicine

## 2012-11-08 ENCOUNTER — Ambulatory Visit: Payer: Self-pay | Admitting: Pain Medicine

## 2012-11-15 ENCOUNTER — Ambulatory Visit: Payer: Self-pay | Admitting: Pain Medicine

## 2012-11-17 ENCOUNTER — Ambulatory Visit (HOSPITAL_COMMUNITY): Payer: Self-pay | Admitting: Psychiatry

## 2012-11-22 ENCOUNTER — Other Ambulatory Visit: Payer: Self-pay | Admitting: Family Medicine

## 2012-11-24 ENCOUNTER — Ambulatory Visit: Payer: Self-pay | Admitting: Pain Medicine

## 2012-12-02 ENCOUNTER — Emergency Department (HOSPITAL_COMMUNITY)
Admission: EM | Admit: 2012-12-02 | Discharge: 2012-12-02 | Disposition: A | Discharge status: Home / Self Care | Payer: PRIVATE HEALTH INSURANCE | Attending: Emergency Medicine | Admitting: Emergency Medicine

## 2012-12-02 ENCOUNTER — Encounter (HOSPITAL_COMMUNITY): Payer: Self-pay | Admitting: *Deleted

## 2012-12-02 DIAGNOSIS — Z860100 Personal history of colon polyps, unspecified: Secondary | ICD-10-CM | POA: Insufficient documentation

## 2012-12-02 DIAGNOSIS — M542 Cervicalgia: Secondary | ICD-10-CM | POA: Insufficient documentation

## 2012-12-02 DIAGNOSIS — Z8719 Personal history of other diseases of the digestive system: Secondary | ICD-10-CM | POA: Insufficient documentation

## 2012-12-02 DIAGNOSIS — R51 Headache: Secondary | ICD-10-CM

## 2012-12-02 DIAGNOSIS — Z87891 Personal history of nicotine dependence: Secondary | ICD-10-CM | POA: Insufficient documentation

## 2012-12-02 DIAGNOSIS — R110 Nausea: Secondary | ICD-10-CM | POA: Insufficient documentation

## 2012-12-02 DIAGNOSIS — Z8639 Personal history of other endocrine, nutritional and metabolic disease: Secondary | ICD-10-CM | POA: Insufficient documentation

## 2012-12-02 DIAGNOSIS — I10 Essential (primary) hypertension: Secondary | ICD-10-CM | POA: Insufficient documentation

## 2012-12-02 DIAGNOSIS — F1910 Other psychoactive substance abuse, uncomplicated: Secondary | ICD-10-CM | POA: Insufficient documentation

## 2012-12-02 DIAGNOSIS — Z862 Personal history of diseases of the blood and blood-forming organs and certain disorders involving the immune mechanism: Secondary | ICD-10-CM | POA: Insufficient documentation

## 2012-12-02 DIAGNOSIS — F3289 Other specified depressive episodes: Secondary | ICD-10-CM | POA: Insufficient documentation

## 2012-12-02 DIAGNOSIS — M549 Dorsalgia, unspecified: Secondary | ICD-10-CM | POA: Insufficient documentation

## 2012-12-02 DIAGNOSIS — Z8619 Personal history of other infectious and parasitic diseases: Secondary | ICD-10-CM | POA: Insufficient documentation

## 2012-12-02 DIAGNOSIS — Z79899 Other long term (current) drug therapy: Secondary | ICD-10-CM | POA: Insufficient documentation

## 2012-12-02 DIAGNOSIS — F329 Major depressive disorder, single episode, unspecified: Secondary | ICD-10-CM | POA: Insufficient documentation

## 2012-12-02 DIAGNOSIS — K219 Gastro-esophageal reflux disease without esophagitis: Secondary | ICD-10-CM | POA: Insufficient documentation

## 2012-12-02 DIAGNOSIS — G8929 Other chronic pain: Secondary | ICD-10-CM | POA: Insufficient documentation

## 2012-12-02 MED ORDER — HYDROMORPHONE HCL PF 2 MG/ML IJ SOLN
2.0000 mg | Freq: Once | INTRAMUSCULAR | Status: AC
  Administered 2012-12-02: 2 mg via INTRAMUSCULAR
  Filled 2012-12-02: qty 1

## 2012-12-02 MED ORDER — ONDANSETRON 8 MG PO TBDP
8.0000 mg | ORAL_TABLET | Freq: Once | ORAL | Status: AC
  Administered 2012-12-02: 8 mg via ORAL
  Filled 2012-12-02: qty 1

## 2012-12-02 NOTE — ED Provider Notes (Signed)
History  This chart was scribed for Flint Melter, MD by Greggory Stallion, ED Scribe and Bennett Scrape, ED Scribe. This patient was seen in room APA12/APA12 and the patient's care was started at 5:39 PM.   CSN: [id number]  Arrival date & time 12/02/12  1654     Chief Complaint  Patient presents with  . Back Pain    The history is provided by the patient. No language interpreter was used.    Marc [name] is a 57 y.o. male with a h/o chronic back pain who presents to the Emergency Department complaining of constant back pain that radiates into the lower neck that started yesterday with associated headache and nausea as associated symptoms. Pt states when he was getting up this morning his back pain became worse. He reports that since then the pain has been aggravated by movement. He recently had Medtronic tens unit placed in the lumbar back on 11/15/12 by Dr. Imelda Pillow at [facility] with no complications. He also states he has taken hydrocodone prescribed by Dr. Aretta Nip in [place] with no relief. He was originally on 75 mg Fentanyl patch and was decreased to 25 mg Fentanyl patch. He has also tried ASA and ibuprofen with no relief. Pt denies fever, cough, chest pain, SOB, and dysuria.   PCP is Dr. Syliva Overman.   Past Medical History  Diagnosis Date  . Hepatitis C reactive     LIVER BX 2008-CHRONIC ACTIVE HEPATITIS  . Back problem   . GERD (gastroesophageal reflux disease)   . IBS (irritable bowel syndrome)   . HTN (hypertension)   . Hypercholesterolemia   . Hx of adenomatous colonic polyps 2008    due for surveillance 2018  . Knee pain   . Substance abuse     narcotic addiction  . Thyroiditis     History of  . Normal echocardiogram 05/25/2011    EF 55%, mild TR  . Normal cardiac stress test 05/25/2011    [facility] [place]  . Depression   . Hepatitis C 1979    Past Surgical History  Procedure Laterality Date  . Appendectomy    . Tonsillectomy    .  Carpal tunnel release    . Right ring finger    . Spinal stenosis, had screws put in neck  04/11/2009    DR KRITZER  . Upper gastrointestinal endoscopy  2008 [facility] ABD PAIN WEIGHT LOSS d100 v8 phen 12.5    NL  . Colonoscopy  2008 [facility] ARS D100 V8 PHEN 12.5    2 SIMPLE ADENOMAS (< 1 CM)  . Knee arthroscopy    . Ulnar nerve transposition    . Esophagogastroduodenoscopy  04/26/2012    [facility]: Non-erosive gastritis (inflammation) was found in the gastric antrum but no H.pylori; multiple biopsies (duodenal bx negative for Celiac)/ The mucosa of the esophagus appeared normal  . Eus  09/08/2012    Procedure: UPPER ENDOSCOPIC ULTRASOUND (EUS) LINEAR;  Surgeon: Rachael Fee, MD;  Location: [facility];  Service: Endoscopy;  Laterality: N/A;    Family History  Problem Relation Age of Onset  . Heart defect      FAMILY HX  . Cancer Mother     BLADDER  . Cancer Sister     METS  . Emotional abuse Sister   . Stroke Sister   . Emotional abuse Sister   . Heart failure Father   . Stroke Father   . Alcohol abuse Father   . Colon cancer Neg  Hx   . Colon polyps Neg Hx   . Drug abuse Neg Hx   . Schizophrenia Neg Hx   . Seizures Neg Hx   . Sexual abuse Neg Hx   . Physical abuse Neg Hx   . Dementia Paternal Aunt   . Alcohol abuse Paternal Uncle   . Dementia Paternal Uncle   . Dementia Paternal Grandmother   . ADD / ADHD Cousin   . Bipolar disorder Cousin   . Anxiety disorder Cousin   . Depression Cousin     Committed suicide  . Alcohol abuse Cousin   . OCD Cousin   . Paranoid behavior Cousin     History  Substance Use Topics  . Smoking status: Former Smoker -- 1.00 packs/day    Types: Cigarettes    Quit date: 08/13/1986  . Smokeless tobacco: Never Used     Comment: quit 20 + yrs ago  . Alcohol Use: No      Review of Systems  Constitutional: Negative for fever.  HENT: Positive for neck pain.   Respiratory: Negative for cough and shortness of breath.   Cardiovascular: Negative for  chest pain.  Gastrointestinal: Positive for nausea.  Genitourinary: Negative for dysuria.  Musculoskeletal: Positive for back pain.  All other systems reviewed and are negative.    Allergies  Levofloxacin; Amitiza; and Sulfa antibiotics  Home Medications   Current Outpatient Rx  Name  Route  Sig  Dispense  Refill  . amLODipine (NORVASC) 5 MG tablet   Oral   Take 5 mg by mouth every morning.          Marland Kitchen aspirin-acetaminophen-caffeine (EXCEDRIN MIGRAINE) 250-250-65 MG per tablet   Oral   Take 2 tablets by mouth daily as needed for pain.         . clonazePAM (KLONOPIN) 0.5 MG tablet   Oral   Take 0.5 mg by mouth 2 (two) times daily as needed. For anxiety         . cloNIDine (CATAPRES) 0.1 MG tablet   Oral   Take 0.1 mg by mouth 2 (two) times daily.         Marland Kitchen dimenhyDRINATE (DRAMAMINE) 50 MG tablet   Oral   Take 50 mg by mouth every 8 (eight) hours as needed (for nausea).         . ergocalciferol (VITAMIN D2) 50000 UNITS capsule   Oral   Take 1 capsule (50,000 Units total) by mouth once a week. One capsule once weekly   12 capsule   1   . fentaNYL (DURAGESIC - DOSED MCG/HR) 25 MCG/HR   Transdermal   Place 1 patch onto the skin every 3 (three) days.         Marland Kitchen FLUoxetine (PROZAC) 20 MG capsule   Oral   Take 20 mg by mouth daily before breakfast.         . gabapentin (NEURONTIN) 300 MG capsule   Oral   Take 300 mg by mouth 3 (three) times daily.         Marland Kitchen HYDROcodone-acetaminophen (NORCO) 7.5-325 MG per tablet   Oral   Take 1 tablet by mouth 3 (three) times daily as needed. Pain         . ibuprofen (ADVIL,MOTRIN) 200 MG tablet   Oral   Take 400-600 mg by mouth every 6 (six) hours as needed. For pain         . lansoprazole (PREVACID) 15 MG capsule   Oral   Take  15-30 mg by mouth daily.         . methocarbamol (ROBAXIN-750) 750 MG tablet   Oral   Take 750 mg by mouth 3 (three) times daily.         . Probiotic Product (ALIGN) 4 MG  CAPS   Oral   Take 1 capsule by mouth daily.         . QUEtiapine (SEROQUEL) 50 MG tablet   Oral   Take 50-100 mg by mouth 2 (two) times daily. Take by mouth 1 tablet 3 times daily and 2 at bedtime         . fentaNYL (DURAGESIC - DOSED MCG/HR) 75 MCG/HR   Transdermal   Place 1 patch onto the skin every 3 (three) days.           Triage Vitals: BP 138/80  Pulse 81  Temp(Src) 98.7 F (37.1 C) (Oral)  Resp 18  Ht 5\' 10"  (1.778 m)  Wt 160 lb (72.576 kg)  BMI 22.96 kg/m2  SpO2 96%  Physical Exam  Nursing note and vitals reviewed. Constitutional: He is oriented to person, place, and time. He appears well-developed and well-nourished.  HENT:  Head: Normocephalic and atraumatic.  Right Ear: External ear normal.  Left Ear: External ear normal.  No meningismus.  Eyes: Conjunctivae and EOM are normal. Pupils are equal, round, and reactive to light.  Neck: Normal range of motion and phonation normal. Neck supple.  Cardiovascular: Normal rate, regular rhythm, normal heart sounds and intact distal pulses.   Pulmonary/Chest: Effort normal and breath sounds normal. He exhibits no bony tenderness.  Abdominal: Soft. Normal appearance. There is no tenderness.  Musculoskeletal: Normal range of motion.  Tenderness on lower thoracic and lumbar spine, gait is normal. Right lumbar tenderness. Several well-healing surgical wounds to the thoracic and lumbar regions, no dehiscence    Neurological: He is alert and oriented to person, place, and time. He has normal strength. No cranial nerve deficit or sensory deficit. He exhibits normal muscle tone. Coordination normal.  Skin: Skin is warm, dry and intact.  Psychiatric: He has a normal mood and affect. His behavior is normal. Judgment and thought content normal.    ED Course  Procedures (including critical care time)  DIAGNOSTIC STUDIES: Oxygen Saturation is 96% on RA, normal by my interpretation.    COORDINATION OF CARE: 7:07  PM-Discussed treatment plan which includes pain medications with pt at bedside and pt agreed to plan.  8:18 PM- Upon recheck, pt states he does not feel better and would like another shot of dilaudid.   9:28 PM- Upon recheck, pt states he is doing a lot better now.  Plan: Home Medications- continue with prescribed medication; Home Treatments- take prescribed medications; Recommended follow up- with PCP as needed   Medications  HYDROmorphone (DILAUDID) injection 2 mg (2 mg Intramuscular Given 12/02/12 1927)  ondansetron (ZOFRAN-ODT) disintegrating tablet 8 mg (8 mg Oral Given 12/02/12 1928)  HYDROmorphone (DILAUDID) injection 2 mg (2 mg Intramuscular Given 12/02/12 2042)             1. Chronic back pain   2. Headache       MDM  Nursing Notes Reviewed/ Care Coordinated, and agree without changes. Applicable Imaging Reviewed.  Interpretation of Laboratory Data incorporated into ED treatment  Chronic back pain with exacerbation. The fentanyl patch was decreased to a much lower level 2 weeks ago. . There is no evidence for surgical wound infection, discitis, meningitis or encephalitis. Doubt metabolic instability,  serious bacterial infection or impending vascular collapse; the patient is stable for discharge.      I personally performed the services described in this documentation, which was scribed in my presence. The recorded information has been reviewed and is accurate.     Flint Melter, MD 12/02/12 [id number]

## 2012-12-02 NOTE — ED Notes (Signed)
Pt states possible migraine earlier with nausea and back pain. Recently had medtronic tens unit placed in back.

## 2012-12-02 NOTE — ED Notes (Signed)
Pt c/o pain, and EDP aware.

## 2012-12-08 ENCOUNTER — Encounter: Payer: Self-pay | Admitting: Gastroenterology

## 2012-12-08 ENCOUNTER — Encounter: Payer: Self-pay | Admitting: Family Medicine

## 2012-12-08 ENCOUNTER — Ambulatory Visit (INDEPENDENT_AMBULATORY_CARE_PROVIDER_SITE_OTHER): Payer: PRIVATE HEALTH INSURANCE | Admitting: Gastroenterology

## 2012-12-08 ENCOUNTER — Ambulatory Visit (INDEPENDENT_AMBULATORY_CARE_PROVIDER_SITE_OTHER): Payer: PRIVATE HEALTH INSURANCE | Admitting: Family Medicine

## 2012-12-08 VITALS — BP 124/68 | HR 90 | Resp 18 | Ht 70.0 in | Wt 159.0 lb

## 2012-12-08 VITALS — BP 128/78 | HR 75 | Temp 97.4°F | Ht 70.0 in | Wt 161.2 lb

## 2012-12-08 DIAGNOSIS — K838 Other specified diseases of biliary tract: Secondary | ICD-10-CM

## 2012-12-08 DIAGNOSIS — I10 Essential (primary) hypertension: Secondary | ICD-10-CM

## 2012-12-08 DIAGNOSIS — B182 Chronic viral hepatitis C: Secondary | ICD-10-CM

## 2012-12-08 DIAGNOSIS — K589 Irritable bowel syndrome without diarrhea: Secondary | ICD-10-CM

## 2012-12-08 DIAGNOSIS — M549 Dorsalgia, unspecified: Secondary | ICD-10-CM

## 2012-12-08 DIAGNOSIS — F329 Major depressive disorder, single episode, unspecified: Secondary | ICD-10-CM

## 2012-12-08 DIAGNOSIS — J0110 Acute frontal sinusitis, unspecified: Secondary | ICD-10-CM | POA: Insufficient documentation

## 2012-12-08 MED ORDER — PENICILLIN V POTASSIUM 500 MG PO TABS: 500.0000 mg | ORAL_TABLET | Freq: Three times a day (TID) | ORAL | Status: DC

## 2012-12-08 NOTE — Patient Instructions (Signed)
USE SENNA DAILY.   DRINK WATER TO KEEP URINE LIGHT YELLOW.   EAT FIBER.   FOLLOW UP IN DEC 2014

## 2012-12-08 NOTE — Progress Notes (Signed)
Subjective:    Patient ID: Marc [name], male    DOB: 12/24/1955, 57 y.o.   MRN: [id number]  PCP: [facility]  HPI THING THAT WORKS FOR HIS CONSTIPATION IS SENNA. BUT WHEN IT WORKS IT CREATES A MESS (DIARRHEA) OR EATS THE WRONG THING AND GET DIARRHEA. WOULD LIKE TO TRY TRY THE SHOT. RARE NAUSEA/heartburn or indigestion. ABD CRAMPS > 2-3X/WEEK LOWER AND RUQ. GETS BETTER WHEN HE HAS A BM. PT DENIES FEVER, CHILLS, BRBPR, Vomiting, melena,  problems swallowing.  Past Medical History  Diagnosis Date  . Hepatitis C reactive     LIVER BX 2008-CHRONIC ACTIVE HEPATITIS  . Back problem   . GERD (gastroesophageal reflux disease)   . IBS (irritable bowel syndrome)   . HTN (hypertension)   . Hypercholesterolemia   . Hx of adenomatous colonic polyps 2008    due for surveillance 2018  . Knee pain   . Substance abuse     narcotic addiction  . Thyroiditis     History of  . Normal echocardiogram 05/25/2011    EF 55%, mild TR  . Normal cardiac stress test 05/25/2011    [facility] [place]  . Depression   . Hepatitis C 1979   Past Surgical History  Procedure Laterality Date  . Appendectomy    . Tonsillectomy    . Carpal tunnel release    . Right ring finger    . Spinal stenosis, had screws put in neck  04/11/2009    DR KRITZER  . Upper gastrointestinal endoscopy  2008 [facility] ABD PAIN WEIGHT LOSS d100 v8 phen 12.5    NL  . Colonoscopy  2008 [facility] ARS D100 V8 PHEN 12.5    2 SIMPLE ADENOMAS (< 1 CM)  . Knee arthroscopy    . Ulnar nerve transposition    . Esophagogastroduodenoscopy  04/26/2012    SLF: Non-erosive gastritis (inflammation) was found in the gastric antrum but no H.pylori; multiple biopsies (duodenal bx negative for Celiac)/ The mucosa of the esophagus appeared normal  . Eus  09/08/2012    BENIGN CBD DILATATION    Allergies  Allergen Reactions  . Levofloxacin Nausea Only and Other (See Comments)    "Creepy" feeling, skin didn't feel right, sort of itchy.  Valinda Hoar  (Lubiprostone)     MADE HIM FEEL STIFF  . Sulfa Antibiotics     Current Outpatient Prescriptions  Medication Sig Dispense Refill  . amLODipine (NORVASC) 5 MG tablet Take 5 mg by mouth every morning.       Marland Kitchen aspirin-acetaminophen-caffeine (EXCEDRIN MIGRAINE) 250-250-65 MG per tablet Take 2 tablets by mouth daily as needed for pain.  1-2X/WK    . clonazePAM (KLONOPIN) 0.5 MG tablet Take 0.5 mg by mouth 2 (two) times daily as needed. For anxiety      . cloNIDine (CATAPRES) 0.1 MG tablet Take 0.1 mg by mouth 2 (two) times daily.      Marland Kitchen dimenhyDRINATE (DRAMAMINE) 50 MG tablet Take 50 mg by mouth every 8 (eight) hours as needed (for nausea).      . ergocalciferol (VITAMIN D2) 50000 UNITS capsule Take 1 capsule (50,000 Units total) by mouth once a week. One capsule once weekly  12 capsule  1  . fentaNYL (DURAGESIC - DOSED MCG/HR) 25 MCG/HR Place 1 patch onto the skin every 3 (three) days.  REDUCED FROM 75 TO 25 APR 17    . FLUoxetine (PROZAC) 20 MG capsule Take 20 mg by mouth daily before breakfast.      .  gabapentin (NEURONTIN) 300 MG capsule Take 300 mg by mouth 3 (three) times daily.      Marland Kitchen HYDROcodone-acetaminophen (NORCO) 7.5-325 MG per tablet Take 1 tablet by mouth 3 (three) times daily as needed. Pain  3/DAY    . ibuprofen (ADVIL,MOTRIN) 200 MG tablet Take 400-600 mg by mouth every 6 (six) hours as needed. For pain      . lansoprazole (PREVACID) 15 MG capsule Take 30 mg by mouth daily.      . methocarbamol (ROBAXIN-750) 750 MG tablet Take 750 mg by mouth 3 (three) times daily.      . penicillin v potassium (VEETID) 500 MG tablet Take 1 tablet (500 mg total) by mouth 3 (three) times daily.  21 tablet  0  . Probiotic Product (ALIGN) 4 MG CAPS Take 1 capsule by mouth daily.  NOT SO MUCH TAKING IT    . QUEtiapine (SEROQUEL) 50 MG tablet Take 50-100 mg by mouth 2 (two) times daily. Take by mouth 1 tablet 3 times daily and 2 at bedtime        Review of Systems     Objective:   Physical Exam   Vitals reviewed. Constitutional: He is oriented to person, place, and time. He appears well-nourished. No distress.  HENT:  Head: Normocephalic and atraumatic.  Mouth/Throat: Oropharynx is clear and moist. No oropharyngeal exudate.  Eyes: Pupils are equal, round, and reactive to light. No scleral icterus.  Neck: Normal range of motion. Neck supple.  Cardiovascular: Normal rate, regular rhythm and normal heart sounds.   Pulmonary/Chest: Effort normal and breath sounds normal. No respiratory distress.  Abdominal: Soft. Bowel sounds are normal. He exhibits no distension. There is no tenderness.  Musculoskeletal: He exhibits no edema.  Lymphadenopathy:    He has no cervical adenopathy.  Neurological: He is alert and oriented to person, place, and time.  NO  NEW FOCAL DEFICITS   Psychiatric:  FLAT AFFECT, NL MOOD           Assessment & Plan:

## 2012-12-08 NOTE — Assessment & Plan Note (Signed)
FAILED INTERFERON REGIMEN IN THE PAST.  CONSIDER NON-INTERFERON REGIMEN WHEN AVAILABLE. OPV IN DEC 2013. NEED HFP 2X/YEAR

## 2012-12-08 NOTE — Assessment & Plan Note (Signed)
BENIGN DUCT DILATION.  CHECK HFP 2X/YEAR. NO ADDITIONAL WORKUP NEEDED AT THIS TIME.

## 2012-12-08 NOTE — Progress Notes (Signed)
  Subjective:    Patient ID: Marc [name], male    DOB: 03/20/1956, 57 y.o.   MRN: [id number]  HPI The PT is here for follow up and re-evaluation of chronic medical conditions, medication management and review of any available recent lab and radiology data.  Preventive health is updated, specifically  Cancer screening and Immunization.   Has a nerve stimulator placed since last visit, not happy with pain control, states not as effective as the tester, has appt with the manufacturer to try to adjust. Will schedule an appointment with his pain Doc, duragesic dose has been decreased and he states pain is now uncontrolled 5 day h/o increased sinus pressure with green drainage, de nies documented fever has had  Chills. Denies ear pain, orsore thraoat, at times coughs up green sputum    Review of Systems See HPI Denies chest pains, palpitations and leg swelling Denies abdominal pain, nausea, vomiting,diarrhea or constipation.   Denies dysuria, frequency, hesitancy or incontinence.  Denies headaches, seizures, numbness, or tingling. Denies uncontrolled depression, anxiety or insomnia.Needs to make an appt for therapy Denies skin break down or rash.        Objective:   Physical Exam Patient alert and oriented and in no cardiopulmonary distress.  HEENT: No facial asymmetry, EOMI, frontal sinus tenderness,  oropharynx pink and moist.  Neck supple no adenopathy.TM clear bilaterally  Chest: Clear to auscultation bilaterally.  CVS: S1, S2 no murmurs, no S3.  ABD: Soft non tender. Bowel sounds normal.  Ext: No edema  MS: decreased though adequate  ROM spine,adequate in  shoulders, hips and knees.  Skin: Intact, no ulcerations or rash noted.  Psych: Good eye contact,flat  affect. Memory intact not anxious mildly depressed appearing.  CNS: CN 2-12 intact, power, tone and sensation normal throughout.        Assessment & Plan:

## 2012-12-08 NOTE — Assessment & Plan Note (Addendum)
MIXED-CONSTIPATION EXACERBATED BY NARCOTICS. PT RECENTLY HAD FENTANYL PATCH DOSE REDUCTION.  SENNA PR. COULD NOT TOLERATE AMITIZA. PT DECLINED RELISTOR AFTER REVIEWING SIDE EFFECTS OF MEDICATION. OPV IN DEC 2013.

## 2012-12-08 NOTE — Patient Instructions (Addendum)
F/u in 4 month, please call if you need me before.  Blood pressure is excellent  Chem 7 today   Penicillin  sent in for sinus symptoms  Please make and keep appointments with Dr Eduard Clos and behavioral health

## 2012-12-08 NOTE — Progress Notes (Signed)
Forwarded to PCP.

## 2012-12-09 LAB — BASIC METABOLIC PANEL
BUN: 16 mg/dL (ref 6–23)
CO2: 24 mEq/L (ref 19–32)
Glucose, Bld: 110 mg/dL — ABNORMAL HIGH (ref 70–99)
Potassium: 4.4 mEq/L (ref 3.5–5.3)
Sodium: 141 mEq/L (ref 135–145)

## 2012-12-09 NOTE — Progress Notes (Signed)
Reminder in epic °

## 2012-12-11 NOTE — Assessment & Plan Note (Signed)
Currently satble, however needs o re establish with therapist

## 2012-12-11 NOTE — Assessment & Plan Note (Signed)
Reports dissatisfaction with current management, will return to treating MD for this

## 2012-12-29 ENCOUNTER — Encounter (HOSPITAL_COMMUNITY): Payer: Self-pay | Admitting: Emergency Medicine

## 2012-12-29 ENCOUNTER — Emergency Department (HOSPITAL_COMMUNITY)
Admission: EM | Admit: 2012-12-29 | Discharge: 2012-12-29 | Disposition: A | Discharge status: Home / Self Care | Payer: PRIVATE HEALTH INSURANCE | Attending: Emergency Medicine | Admitting: Emergency Medicine

## 2012-12-29 DIAGNOSIS — Z860100 Personal history of colon polyps, unspecified: Secondary | ICD-10-CM | POA: Insufficient documentation

## 2012-12-29 DIAGNOSIS — F329 Major depressive disorder, single episode, unspecified: Secondary | ICD-10-CM | POA: Insufficient documentation

## 2012-12-29 DIAGNOSIS — F1120 Opioid dependence, uncomplicated: Secondary | ICD-10-CM | POA: Insufficient documentation

## 2012-12-29 DIAGNOSIS — K219 Gastro-esophageal reflux disease without esophagitis: Secondary | ICD-10-CM | POA: Insufficient documentation

## 2012-12-29 DIAGNOSIS — E7800 Pure hypercholesterolemia, unspecified: Secondary | ICD-10-CM | POA: Insufficient documentation

## 2012-12-29 DIAGNOSIS — Z8619 Personal history of other infectious and parasitic diseases: Secondary | ICD-10-CM | POA: Insufficient documentation

## 2012-12-29 DIAGNOSIS — Z9089 Acquired absence of other organs: Secondary | ICD-10-CM | POA: Insufficient documentation

## 2012-12-29 DIAGNOSIS — Z862 Personal history of diseases of the blood and blood-forming organs and certain disorders involving the immune mechanism: Secondary | ICD-10-CM | POA: Insufficient documentation

## 2012-12-29 DIAGNOSIS — Z87891 Personal history of nicotine dependence: Secondary | ICD-10-CM | POA: Insufficient documentation

## 2012-12-29 DIAGNOSIS — Z9889 Other specified postprocedural states: Secondary | ICD-10-CM | POA: Insufficient documentation

## 2012-12-29 DIAGNOSIS — M545 Low back pain, unspecified: Secondary | ICD-10-CM | POA: Insufficient documentation

## 2012-12-29 DIAGNOSIS — R51 Headache: Secondary | ICD-10-CM

## 2012-12-29 DIAGNOSIS — Z8639 Personal history of other endocrine, nutritional and metabolic disease: Secondary | ICD-10-CM | POA: Insufficient documentation

## 2012-12-29 DIAGNOSIS — Z8719 Personal history of other diseases of the digestive system: Secondary | ICD-10-CM | POA: Insufficient documentation

## 2012-12-29 DIAGNOSIS — M542 Cervicalgia: Secondary | ICD-10-CM | POA: Insufficient documentation

## 2012-12-29 DIAGNOSIS — G8929 Other chronic pain: Secondary | ICD-10-CM

## 2012-12-29 DIAGNOSIS — F3289 Other specified depressive episodes: Secondary | ICD-10-CM | POA: Insufficient documentation

## 2012-12-29 DIAGNOSIS — M436 Torticollis: Secondary | ICD-10-CM | POA: Insufficient documentation

## 2012-12-29 DIAGNOSIS — I10 Essential (primary) hypertension: Secondary | ICD-10-CM | POA: Insufficient documentation

## 2012-12-29 DIAGNOSIS — M549 Dorsalgia, unspecified: Secondary | ICD-10-CM

## 2012-12-29 DIAGNOSIS — Z8739 Personal history of other diseases of the musculoskeletal system and connective tissue: Secondary | ICD-10-CM | POA: Insufficient documentation

## 2012-12-29 DIAGNOSIS — Z79899 Other long term (current) drug therapy: Secondary | ICD-10-CM | POA: Insufficient documentation

## 2012-12-29 MED ORDER — METHOCARBAMOL 500 MG PO TABS
1000.0000 mg | ORAL_TABLET | Freq: Once | ORAL | Status: AC
  Administered 2012-12-29: 1000 mg via ORAL
  Filled 2012-12-29: qty 2

## 2012-12-29 MED ORDER — HYDROMORPHONE HCL PF 1 MG/ML IJ SOLN
2.0000 mg | Freq: Once | INTRAMUSCULAR | Status: AC
  Administered 2012-12-29: 2 mg via INTRAMUSCULAR
  Filled 2012-12-29: qty 2

## 2012-12-29 MED ORDER — PROMETHAZINE HCL 12.5 MG PO TABS
12.5000 mg | ORAL_TABLET | Freq: Once | ORAL | Status: AC
  Administered 2012-12-29: 12.5 mg via ORAL
  Filled 2012-12-29: qty 1

## 2012-12-29 NOTE — ED Notes (Signed)
States that he has a problem with back pain, states that his chronic pain is worse today.  States he has a headache, lower back pain and leg stiffness.  Denies any new symptoms.

## 2012-12-29 NOTE — ED Provider Notes (Signed)
Medical screening examination/treatment/procedure(s) were performed by non-physician practitioner and as supervising physician I was immediately available for consultation/collaboration.  Donnetta Hutching, MD 12/29/12 2300

## 2012-12-29 NOTE — ED Provider Notes (Signed)
History     CSN: [id number]  Arrival date & time 12/29/12  1600   First MD Initiated Contact with Patient 12/29/12 1609      Chief Complaint  Patient presents with  . Back Pain  . Neck Pain  . Headache    (Consider location/radiation/quality/duration/timing/severity/associated sxs/prior treatment) Patient is a 57 y.o. male presenting with back pain, neck pain, and headaches. The history is provided by the patient.  Back Pain Location:  Lumbar spine Quality:  Aching Pain severity:  Severe Pain is:  Same all the time Onset quality:  Gradual Timing:  Constant Progression:  Worsening Chronicity:  Chronic Context comment:  Chronic pain with areas of disc space narrowing on MRI and xrays. Relieved by:  Nothing Worsened by:  Movement (change of position.) Ineffective treatments:  Ibuprofen and narcotics Associated symptoms: headaches   Associated symptoms: no bladder incontinence, no bowel incontinence and no perianal numbness   Neck Pain Associated symptoms: headaches   Associated symptoms: no bladder incontinence and no bowel incontinence   Headache Associated symptoms: back pain and neck pain     Past Medical History  Diagnosis Date  . Hepatitis C reactive     LIVER BX 2008-CHRONIC ACTIVE HEPATITIS  . Back problem   . GERD (gastroesophageal reflux disease)   . IBS (irritable bowel syndrome)   . HTN (hypertension)   . Hypercholesterolemia   . Hx of adenomatous colonic polyps 2008    due for surveillance 2018  . Knee pain   . Substance abuse     narcotic addiction  . Thyroiditis     History of  . Normal echocardiogram 05/25/2011    EF 55%, mild TR  . Normal cardiac stress test 05/25/2011    [facility] [place]  . Depression   . Hepatitis C 1979    Past Surgical History  Procedure Laterality Date  . Appendectomy    . Tonsillectomy    . Carpal tunnel release    . Right ring finger    . Spinal stenosis, had screws put in neck  04/11/2009    DR  KRITZER  . Upper gastrointestinal endoscopy  2008 [facility] ABD PAIN WEIGHT LOSS d100 v8 phen 12.5    NL  . Colonoscopy  2008 [facility] ARS D100 V8 PHEN 12.5    2 SIMPLE ADENOMAS (< 1 CM)  . Knee arthroscopy    . Ulnar nerve transposition    . Esophagogastroduodenoscopy  04/26/2012    [facility]: Non-erosive gastritis (inflammation) was found in the gastric antrum but no H.pylori; multiple biopsies (duodenal bx negative for Celiac)/ The mucosa of the esophagus appeared normal  . Eus  09/08/2012    Procedure: UPPER ENDOSCOPIC ULTRASOUND (EUS) LINEAR;  Surgeon: Rachael Fee, MD;  Location: [facility];  Service: Endoscopy;  Laterality: N/A;  . Neurostimulator implant      Family History  Problem Relation Age of Onset  . Heart defect      FAMILY HX  . Cancer Mother     BLADDER  . Cancer Sister     METS  . Emotional abuse Sister   . Stroke Sister   . Emotional abuse Sister   . Heart failure Father   . Stroke Father   . Alcohol abuse Father   . Colon cancer Neg Hx   . Colon polyps Neg Hx   . Drug abuse Neg Hx   . Schizophrenia Neg Hx   . Seizures Neg Hx   . Sexual abuse Neg Hx   .  Physical abuse Neg Hx   . Dementia Paternal Aunt   . Alcohol abuse Paternal Uncle   . Dementia Paternal Uncle   . Dementia Paternal Grandmother   . ADD / ADHD Cousin   . Bipolar disorder Cousin   . Anxiety disorder Cousin   . Depression Cousin     Committed suicide  . Alcohol abuse Cousin   . OCD Cousin   . Paranoid behavior Cousin     History  Substance Use Topics  . Smoking status: Former Smoker -- 1.00 packs/day    Types: Cigarettes    Quit date: 08/13/1986  . Smokeless tobacco: Never Used     Comment: quit 20 + yrs ago  . Alcohol Use: No      Review of Systems  HENT: Positive for neck pain.   Gastrointestinal: Negative for bowel incontinence.  Genitourinary: Negative for bladder incontinence.  Musculoskeletal: Positive for back pain.  Neurological: Positive for headaches.    Allergies   Levofloxacin; Amitiza; and Sulfa antibiotics  Home Medications   Current Outpatient Rx  Name  Route  Sig  Dispense  Refill  . amLODipine (NORVASC) 5 MG tablet   Oral   Take 5 mg by mouth every morning.          Marland Kitchen aspirin-acetaminophen-caffeine (EXCEDRIN MIGRAINE) 250-250-65 MG per tablet   Oral   Take 2 tablets by mouth daily as needed for pain.         . clonazePAM (KLONOPIN) 0.5 MG tablet   Oral   Take 0.5 mg by mouth 2 (two) times daily as needed. For anxiety         . cloNIDine (CATAPRES) 0.1 MG tablet   Oral   Take 0.1 mg by mouth 2 (two) times daily.         Marland Kitchen dimenhyDRINATE (DRAMAMINE) 50 MG tablet   Oral   Take 50 mg by mouth every 8 (eight) hours as needed (for nausea).         . ergocalciferol (VITAMIN D2) 50000 UNITS capsule   Oral   Take 1 capsule (50,000 Units total) by mouth once a week. One capsule once weekly   12 capsule   1   . fentaNYL (DURAGESIC - DOSED MCG/HR) 25 MCG/HR   Transdermal   Place 1 patch onto the skin every 3 (three) days.         Marland Kitchen FLUoxetine (PROZAC) 20 MG capsule   Oral   Take 20 mg by mouth daily before breakfast.         . gabapentin (NEURONTIN) 300 MG capsule   Oral   Take 300 mg by mouth 3 (three) times daily.         Marland Kitchen HYDROcodone-acetaminophen (NORCO) 7.5-325 MG per tablet   Oral   Take 1 tablet by mouth 3 (three) times daily as needed. Pain         . ibuprofen (ADVIL,MOTRIN) 200 MG tablet   Oral   Take 400-600 mg by mouth every 6 (six) hours as needed. For pain         . lansoprazole (PREVACID) 15 MG capsule   Oral   Take 15-30 mg by mouth daily.         . methocarbamol (ROBAXIN-750) 750 MG tablet   Oral   Take 750 mg by mouth 3 (three) times daily.         . penicillin v potassium (VEETID) 500 MG tablet   Oral   Take 1 tablet (500 mg  total) by mouth 3 (three) times daily.   21 tablet   0   . Probiotic Product (ALIGN) 4 MG CAPS   Oral   Take 1 capsule by mouth daily.         .  QUEtiapine (SEROQUEL) 50 MG tablet   Oral   Take 50-100 mg by mouth 2 (two) times daily. Take by mouth 1 tablet 3 times daily and 2 at bedtime           BP 128/90  Pulse 75  Temp(Src) 98.2 F (36.8 C) (Oral)  Resp 20  Ht 5\' 10"  (1.778 m)  Wt 155 lb (70.308 kg)  BMI 22.24 kg/m2  SpO2 100%  Physical Exam  Nursing note and vitals reviewed. Constitutional: He is oriented to person, place, and time. He appears well-developed and well-nourished.  Non-toxic appearance.  HENT:  Head: Normocephalic.  Right Ear: Tympanic membrane and external ear normal.  Left Ear: Tympanic membrane and external ear normal.  Eyes: EOM and lids are normal. Pupils are equal, round, and reactive to light.  Neck: Normal range of motion. Neck supple. Carotid bruit is not present.  Cardiovascular: Normal rate, regular rhythm, normal heart sounds, intact distal pulses and normal pulses.   Pulmonary/Chest: Breath sounds normal. No respiratory distress.  Abdominal: Soft. Bowel sounds are normal. There is no tenderness. There is no guarding.  Musculoskeletal:       Cervical back: He exhibits decreased range of motion.       Lumbar back: He exhibits decreased range of motion, tenderness, pain and spasm.  There is stiffness throughout the cervical, throacic, and lumbar spine area.  Lymphadenopathy:       Head (right side): No submandibular adenopathy present.       Head (left side): No submandibular adenopathy present.    He has no cervical adenopathy.  Neurological: He is alert and oriented to person, place, and time. He has normal strength. No cranial nerve deficit or sensory deficit. GCS eye subscore is 4. GCS verbal subscore is 5. GCS motor subscore is 6.  Skin: Skin is warm and dry.  Psychiatric: He has a normal mood and affect. His speech is normal.    ED Course  Procedures (including critical care time)  Labs Reviewed - No data to display No results found.   No diagnosis found.    MDM  I have  reviewed nursing notes, vital signs, and all appropriate lab and imaging results for this patient. Pt has a hx of chronic back problems. He is treated with a neurostimulator an pain patches. He is having breakthrough pain today. I have reviewed previous ED visits and imaging. No new neuro deficits. Plan - Pt given Injection of Dilaudid and oral robaxin in the ED. Pt to get in touch with his specialist tomorrow.       Kathie Dike, PA-C 12/29/12 1640

## 2013-01-03 ENCOUNTER — Other Ambulatory Visit: Payer: Self-pay | Admitting: Family Medicine

## 2013-01-05 ENCOUNTER — Telehealth (HOSPITAL_COMMUNITY): Payer: Self-pay | Admitting: Psychiatry

## 2013-01-05 ENCOUNTER — Ambulatory Visit (HOSPITAL_COMMUNITY): Payer: Self-pay | Admitting: Psychiatry

## 2013-01-05 MED ORDER — FLUOXETINE HCL 20 MG PO CAPS: 20.0000 mg | ORAL_CAPSULE | Freq: Every day | ORAL | Status: DC

## 2013-01-05 NOTE — Telephone Encounter (Signed)
6 tabs of Prozac approved until appointment on the 2nd

## 2013-01-06 ENCOUNTER — Ambulatory Visit (HOSPITAL_COMMUNITY): Payer: PRIVATE HEALTH INSURANCE

## 2013-01-06 ENCOUNTER — Ambulatory Visit (HOSPITAL_COMMUNITY)
Admission: RE | Admit: 2013-01-06 | Discharge: 2013-01-06 | Disposition: A | Discharge status: Home / Self Care | Payer: PRIVATE HEALTH INSURANCE | Source: Ambulatory Visit | Attending: Physical Medicine and Rehabilitation | Admitting: Physical Medicine and Rehabilitation

## 2013-01-06 ENCOUNTER — Other Ambulatory Visit (HOSPITAL_COMMUNITY): Payer: Self-pay | Admitting: Physical Medicine and Rehabilitation

## 2013-01-06 DIAGNOSIS — G894 Chronic pain syndrome: Secondary | ICD-10-CM

## 2013-01-06 DIAGNOSIS — IMO0002 Reserved for concepts with insufficient information to code with codable children: Secondary | ICD-10-CM

## 2013-01-06 DIAGNOSIS — M546 Pain in thoracic spine: Secondary | ICD-10-CM

## 2013-01-06 DIAGNOSIS — M47814 Spondylosis without myelopathy or radiculopathy, thoracic region: Secondary | ICD-10-CM

## 2013-01-06 DIAGNOSIS — IMO0001 Reserved for inherently not codable concepts without codable children: Secondary | ICD-10-CM

## 2013-01-09 ENCOUNTER — Ambulatory Visit (INDEPENDENT_AMBULATORY_CARE_PROVIDER_SITE_OTHER): Payer: PRIVATE HEALTH INSURANCE | Admitting: Psychiatry

## 2013-01-09 ENCOUNTER — Encounter (HOSPITAL_COMMUNITY): Payer: Self-pay | Admitting: Psychiatry

## 2013-01-09 VITALS — BP 128/83 | HR 78 | Ht 68.75 in | Wt 162.4 lb

## 2013-01-09 DIAGNOSIS — F1120 Opioid dependence, uncomplicated: Secondary | ICD-10-CM

## 2013-01-09 DIAGNOSIS — E559 Vitamin D deficiency, unspecified: Secondary | ICD-10-CM

## 2013-01-09 DIAGNOSIS — G4700 Insomnia, unspecified: Secondary | ICD-10-CM

## 2013-01-09 DIAGNOSIS — F1920 Other psychoactive substance dependence, uncomplicated: Secondary | ICD-10-CM

## 2013-01-09 DIAGNOSIS — F329 Major depressive disorder, single episode, unspecified: Secondary | ICD-10-CM

## 2013-01-09 MED ORDER — FLUOXETINE HCL 20 MG PO CAPS: 20.0000 mg | ORAL_CAPSULE | Freq: Every day | ORAL | Status: DC

## 2013-01-09 MED ORDER — QUETIAPINE FUMARATE 50 MG PO TABS: 50.0000 mg | ORAL_TABLET | Freq: Two times a day (BID) | ORAL | Status: DC

## 2013-01-09 NOTE — Patient Instructions (Signed)
Set a timer for 8 or a certain number minutes and walk for that amount of time in the house or in the yard.  Mark the number of minutes on a calendar for that day.  Do that every day this week.  Then next week increase the time by 1 minutes and then mark the calendar with the number of minutes for that day.  Each week increase your exercise by one minute.  Keep a record of this so you can see the progress you are making.  Do this every day, just like eating and sleeping.  It is good for pain control, depression, and for your soul/spirit.  Bring the record in for your next visit so we can talk about your effort and how you feel with the new exercise program going and working for you.  Relaxation is the ultimate solution for you.  You can seek it through tub baths, bubble baths, essential oils or incense, walking or chatting with friends, listening to soft music, watching a candle burn and just letting all thoughts go and appreciating the true essence of the Creator.  Pets or animals may be very helpful.  You might spend some time with them and then go do more directed meditation.  "I am Wishes Fulfilled Meditation" by Wayne W Dyer and James F Twyman may be helpful MUSIC for getting to sleep or for meditating You can order it from on line.  You might find the Chill channel on Pandora and explore the artists that you like better.   Take care of yourself.  No one else is standing up to do the job and only you know what you need.   GET SERIOUS about taking care of yourself.  Do the next right thing and that often means doing something to care for yourself along the lines of are you hungry, are you angry, are you lonely, are you tired, are you scared?  HALTS is what that stands for.  Call if problems or concerns. 

## 2013-01-09 NOTE — Progress Notes (Signed)
Marc [name] [id number] Progress Note Marc [name] MRN: [id number] DOB: [birth year] Age: 57 y.o.  Date: 01/09/2013 Start Time: 1:15 PM End Time: 1:35 PM  Chief Complaint: Chief Complaint  Patient presents with  . Depression  . Follow-up  . Medication Refill   Subjective: "Better today. I'm going over to [facility] for a part time job to pay some medical bills". Depression 7/10 and Anxiety 7/10, where 1 is the best and 10 is the worst.  Pain today is 6/10 back, neck, headaches, legs/shin  History of present illness Patient is a 57 year old Caucasian male who came for his followup appointment.  Pt reports that he got a spinal stimulator for his chronic pain.  Pt reports that he is compliant with the psychotropic medications with good benefit and no noticeable side effects.  Sometimes he feels a little hyper with the Prozac.  Current psychiatric medication Prozac 20 mg in AM Seroquel 50 mg 3 times a day and 2 at bedtime   Klonopin 0.5 mg 1-2 tablet prn prescribed by Dr. Lennie Hummer. Vitamin D 50,000 IU weekly prescribed by PCP  Past psychiatric history Patient has history of depression for many years. He has taken in the past Wellbutrin Cymbalta and Celexa. He denies any history of suicidal attempt.  Alcohol and substance use history Patient has significant history of alcohol with multiple rehabilitation and detox treatment. He has history of DWI. He also had history of taking recreational drugs and he was in the [employer]. He has history of intravenous drug use that cause hepatitis C. Patient also had history of using pain medication.  Allergies: Allergies  Allergen Reactions  . Levofloxacin Nausea Only and Other (See Comments)    "Creepy" feeling, skin didn't feel right, sort of itchy.  Valinda Hoar (Lubiprostone)     MADE HIM FEEL STIFF  . Sulfa Antibiotics    Medical History: Past Medical History  Diagnosis Date  . Hepatitis C reactive     LIVER BX 2008-CHRONIC ACTIVE HEPATITIS   . Back problem   . GERD (gastroesophageal reflux disease)   . IBS (irritable bowel syndrome)   . HTN (hypertension)   . Hypercholesterolemia   . Hx of adenomatous colonic polyps 2008    due for surveillance 2018  . Knee pain   . Substance abuse     narcotic addiction  . Thyroiditis     History of  . Normal echocardiogram 05/25/2011    EF 55%, mild TR  . Normal cardiac stress test 05/25/2011    [facility] [place]  . Depression   . Hepatitis C 1979  Cervical fusion Neck surgery Neck pain Hepatitis C Hypertension Benign tremors High blood pressure He sees Dr. Lodema Hong and Dr. Harrel Carina for pain management.  Surgical History: Past Surgical History  Procedure Laterality Date  . Appendectomy    . Tonsillectomy    . Carpal tunnel release    . Right ring finger    . Spinal stenosis, had screws put in neck  04/11/2009    DR KRITZER  . Upper gastrointestinal endoscopy  2008 [facility] ABD PAIN WEIGHT LOSS d100 v8 phen 12.5    NL  . Colonoscopy  2008 [facility] ARS D100 V8 PHEN 12.5    2 SIMPLE ADENOMAS (< 1 CM)  . Knee arthroscopy    . Ulnar nerve transposition    . Esophagogastroduodenoscopy  04/26/2012    [facility]: Non-erosive gastritis (inflammation) was found in the gastric antrum but no H.pylori; multiple biopsies (duodenal bx negative for  Celiac)/ The mucosa of the esophagus appeared normal  . Eus  09/08/2012    Procedure: UPPER ENDOSCOPIC ULTRASOUND (EUS) LINEAR;  Surgeon: Rachael Fee, MD;  Location: [facility];  Service: Endoscopy;  Laterality: N/A;  . Neurostimulator implant     Family History: family history includes ADD / ADHD in his cousin; Alcohol abuse in his cousin, father, and paternal uncle; Anxiety disorder in his cousin; Bipolar disorder in his cousin; Cancer in his mother and sister; Dementia in his paternal aunt, paternal grandmother, and paternal uncle; Depression in his cousin; Emotional abuse in his sisters; Heart defect in an unspecified family member; Heart  failure in his father; OCD in his cousin; Paranoid behavior in his cousin; and Stroke in his father and sister.  There is no history of Colon cancer, and Colon polyps, and Drug abuse, and Schizophrenia, and Seizures, and Sexual abuse, and Physical abuse, . Reviewed again in the office and no changes were noted except the new pain issues  Psychosocial history Patient was born and raised in [place].  He married twice. 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 Patient has multiple family member who has died in past few years. His father died in 2008/01/05 and mother died in Jan 04, 2009 3 at 2 of his sister also died.  Patient lives by himself.  Mental status examination Patient is casually dressed and groomed. He is tired but cooperative.  He maintained fair eye contact. He described his mood tired and his affect is constricted.  His speech is slow and he has some thought blocking. His thought process is slow but logical linear and goal-directed.  He has mild tremors which are chronic .  He denies any active or passive suicidal thinking or homicidal thinking. He denies any auditory or visual hallucination. He is alert and oriented x3.  His attention and concentration is distracted. There were no paranoia or delusions present at this time. His insight judgment and impulse control is okay.  Diagnosis Axis I Maj. depressive disorder, polysubstance dependence Axis II deferred Axis III see medical history Axis IV mild to moderate Axis V 59-70  Plan/Discussion: I took his vitals.  I reviewed CC, tobacco/med/surg Hx, meds effects/ side effects, problem list, therapies and responses as well as current situation/symptoms discussed options. Continue current effective medications. See orders and pt instructions for more details.  Medical Decision Making Problem Points:  Established problem, stable/improving (1), Review of last therapy session (1) and Review of psycho-social  stressors (1) Data Points:  Review or order clinical lab tests (1) Review of medication regiment & side effects (2)  I certify that outpatient services furnished can reasonably be expected to improve the patient's condition.   Orson Aloe, MD, [name]

## 2013-01-12 ENCOUNTER — Ambulatory Visit (HOSPITAL_COMMUNITY): Payer: Self-pay | Admitting: Psychiatry

## 2013-01-16 ENCOUNTER — Telehealth (HOSPITAL_COMMUNITY): Payer: Self-pay | Admitting: Psychiatry

## 2013-01-16 ENCOUNTER — Ambulatory Visit (HOSPITAL_COMMUNITY): Payer: Self-pay | Admitting: Psychiatry

## 2013-01-16 NOTE — Telephone Encounter (Signed)
Pt called and reported that he was not a robot and he doesn't do the camping thing.  The instructions for him to do something for himself and creative was what he was referring to.  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 Informed that he will be seeing someone else at his next appointment.  He thanked me for the help.

## 2013-01-19 ENCOUNTER — Ambulatory Visit (INDEPENDENT_AMBULATORY_CARE_PROVIDER_SITE_OTHER): Payer: PRIVATE HEALTH INSURANCE | Admitting: Psychiatry

## 2013-01-19 DIAGNOSIS — F331 Major depressive disorder, recurrent, moderate: Secondary | ICD-10-CM

## 2013-01-19 NOTE — Progress Notes (Signed)
.   Patient:  Marc [name]   DOB: November 03, 1955  MR Number: [id number]  [facility]:  [address] [place],  [place], [address]  Start: Thursday 01/19/2013 11:10 AM End: Thursday 01/19/2013 12:00 PM  Provider/Observer:     Florencia Reasons, MSW, LCSW   Chief Complaint:      Chief Complaint  Patient presents with  . Depression  . Anxiety    Reason For Service:     The patient initially was referred by Dr. Lolly Mustache to improve coping and social skills as patient was experiencing sadness, depression, grief, and anger issues. The patient has a history of recurrent periods of depression along with a history of chemical dependency. He has had psychiatric hospitalization due to suicidal thoughts and has participated in inpatient treatment as well as outpatient treatment for chemical dependency. He completed the chemical dependency IOP program in [place] in December 2012. Patient is seen today for a follow-up appointment.  Interventions Strategy:  Supportive therapy, cognitive behavioral therapy  Participation Level:   Active  Participation Quality:  Appropriate and Sharing      Behavioral Observation:  Casual , appropriate  Current Psychosocial Factors: Patient reports continued financial issues, resuming a relationship with a friend with whom he's had difficulties setting boundaries in the past, being payee for a disabled friend, concerns regarding cousin who has been very sick.  Content of Session:   Reviewing symptoms, processing feelings, discussing boundary issues, identifying coping tools  Current Status:   The patient rates depression and anxiety at 7 on a 10 point scale with one being none and 10 being severe.  He denies any thoughts of harming self but reports occasionally praying God would take him.   Patient Progress:   Fair. The patient reports being more active since medical device was implanted in his back to relieve pain. Patient reports he's had good and bad  days but being glad tobe more active. He's been exercising and walking his dog. He reports stress related to the areas relationships. Therapist works with patient to discuss boundary issues and to identify ways to set and maintain boundaries. Therapist and patient also explore coping tools including a gratitude journal. Patient continues to rely  heavily on his spirituality. Therapist works with patient to identify ways to nurture spirituality.    Target Goals:    Decrease anxiety, increasing self acceptance  Last Reviewed:   08/14/2011  Goals Addressed Today:    Decrease anxiety  Impression/Diagnosis:   The patient has a long-standing history of recurrent periods of depression and continues to experience sadness, anger, depressed mood, and poor concentration. Diagnoses: Maj. depressive disorder, recurrent  Diagnosis:  Axis I:  Major depressive disorder, recurrent, moderate          Axis II: Deferred

## 2013-01-19 NOTE — Patient Instructions (Addendum)
Continue self-care efforts  Begin journaling: use a gratitude journal

## 2013-01-24 ENCOUNTER — Other Ambulatory Visit: Payer: Self-pay | Admitting: Family Medicine

## 2013-01-26 ENCOUNTER — Ambulatory Visit (HOSPITAL_BASED_OUTPATIENT_CLINIC_OR_DEPARTMENT_OTHER)
Admission: RE | Admit: 2013-01-26 | Payer: PRIVATE HEALTH INSURANCE | Source: Ambulatory Visit | Admitting: Orthopedic Surgery

## 2013-01-26 ENCOUNTER — Encounter (HOSPITAL_BASED_OUTPATIENT_CLINIC_OR_DEPARTMENT_OTHER): Admission: RE | Payer: Self-pay | Source: Ambulatory Visit

## 2013-01-26 SURGERY — RELEASE, A1 PULLEY, FOR TRIGGER FINGER
Anesthesia: Choice | Site: Hand | Laterality: Left

## 2013-02-01 ENCOUNTER — Emergency Department (HOSPITAL_COMMUNITY)
Admission: EM | Admit: 2013-02-01 | Discharge: 2013-02-01 | Disposition: A | Discharge status: Home / Self Care | Payer: PRIVATE HEALTH INSURANCE | Attending: Emergency Medicine | Admitting: Emergency Medicine

## 2013-02-01 ENCOUNTER — Encounter (HOSPITAL_COMMUNITY): Payer: Self-pay | Admitting: Emergency Medicine

## 2013-02-01 DIAGNOSIS — I10 Essential (primary) hypertension: Secondary | ICD-10-CM | POA: Insufficient documentation

## 2013-02-01 DIAGNOSIS — Z8639 Personal history of other endocrine, nutritional and metabolic disease: Secondary | ICD-10-CM | POA: Insufficient documentation

## 2013-02-01 DIAGNOSIS — IMO0002 Reserved for concepts with insufficient information to code with codable children: Secondary | ICD-10-CM | POA: Insufficient documentation

## 2013-02-01 DIAGNOSIS — Z87891 Personal history of nicotine dependence: Secondary | ICD-10-CM | POA: Insufficient documentation

## 2013-02-01 DIAGNOSIS — K219 Gastro-esophageal reflux disease without esophagitis: Secondary | ICD-10-CM | POA: Insufficient documentation

## 2013-02-01 DIAGNOSIS — G894 Chronic pain syndrome: Secondary | ICD-10-CM | POA: Insufficient documentation

## 2013-02-01 DIAGNOSIS — Z860100 Personal history of colon polyps, unspecified: Secondary | ICD-10-CM | POA: Insufficient documentation

## 2013-02-01 DIAGNOSIS — E7800 Pure hypercholesterolemia, unspecified: Secondary | ICD-10-CM | POA: Insufficient documentation

## 2013-02-01 DIAGNOSIS — R109 Unspecified abdominal pain: Secondary | ICD-10-CM | POA: Insufficient documentation

## 2013-02-01 DIAGNOSIS — M546 Pain in thoracic spine: Secondary | ICD-10-CM

## 2013-02-01 DIAGNOSIS — Z79899 Other long term (current) drug therapy: Secondary | ICD-10-CM | POA: Insufficient documentation

## 2013-02-01 DIAGNOSIS — F329 Major depressive disorder, single episode, unspecified: Secondary | ICD-10-CM | POA: Insufficient documentation

## 2013-02-01 DIAGNOSIS — Z862 Personal history of diseases of the blood and blood-forming organs and certain disorders involving the immune mechanism: Secondary | ICD-10-CM | POA: Insufficient documentation

## 2013-02-01 DIAGNOSIS — Z8619 Personal history of other infectious and parasitic diseases: Secondary | ICD-10-CM | POA: Insufficient documentation

## 2013-02-01 DIAGNOSIS — M542 Cervicalgia: Secondary | ICD-10-CM

## 2013-02-01 DIAGNOSIS — F3289 Other specified depressive episodes: Secondary | ICD-10-CM | POA: Insufficient documentation

## 2013-02-01 MED ORDER — HYDROMORPHONE HCL PF 2 MG/ML IJ SOLN
2.0000 mg | Freq: Once | INTRAMUSCULAR | Status: AC
  Administered 2013-02-01: 2 mg via INTRAMUSCULAR
  Filled 2013-02-01: qty 1

## 2013-02-01 MED ORDER — ONDANSETRON HCL 4 MG/2ML IJ SOLN
4.0000 mg | Freq: Once | INTRAMUSCULAR | Status: AC
  Administered 2013-02-01: 4 mg via INTRAMUSCULAR
  Filled 2013-02-01: qty 2

## 2013-02-01 MED ORDER — HYDROMORPHONE HCL PF 1 MG/ML IJ SOLN
1.0000 mg | Freq: Once | INTRAMUSCULAR | Status: AC
  Administered 2013-02-01: 1 mg via INTRAMUSCULAR
  Filled 2013-02-01: qty 1

## 2013-02-01 NOTE — ED Notes (Signed)
Patient has medical implant in back for chronic back pain. Patient c/o pain in lower back and neck pain x3 days. Per patient pain radiates into flanks bilaterally and into abd. Patient reports neck surgery. Patient reports nausea and diarrhea but denies any vomiting.

## 2013-02-01 NOTE — ED Provider Notes (Signed)
History    CSN: [id number] Arrival date & time 02/01/13  1426  First MD Initiated Contact with Patient 02/01/13 1504     Chief Complaint  Patient presents with  . Neck Pain  . Back Pain  . Flank Pain  . Abdominal Pain   (Consider location/radiation/quality/duration/timing/severity/associated sxs/prior Treatment) HPI Comments: Pt is a 57 yo man who has chronic pain.  He currently is having worse than usual pain in his posterior neck and upper back as well as in the lower back that goes through to his right abdomen.  He uses Fentanyl patches and takes hydrocodone-acetaminophen 7.5/325 for his pain as prescribed by Dr. Nickola Major, his pain management specialist.  He also has a  neurostimulator in his lower back, which was put in in April, 2014.   There has been no new injury.  Patient is a 57 y.o. male presenting with neck pain, back pain, flank pain, and abdominal pain. The history is provided by the patient and medical records. No language interpreter was used.  Neck Pain Pain location: He feels pain in his posterior neck and upper thoracic region, as well as in the lumbar region that is like a knife going from his back to his abdomen. Quality:  Stabbing Pain severity:  Severe Onset quality:  Gradual Duration:  3 days Timing:  Constant Progression:  Worsening Chronicity:  Recurrent Context comment:  No precipitating cause. Relieved by:  Nothing Worsened by:  Nothing tried Ineffective treatments: He has taken his regular pain medicines without current relief. Associated symptoms: no bladder incontinence, no bowel incontinence, no fever, no numbness and no tingling   Back Pain Associated symptoms: abdominal pain   Associated symptoms: no bladder incontinence, no bowel incontinence, no fever, no numbness and no tingling   Flank Pain Associated symptoms include abdominal pain.  Abdominal Pain Associated symptoms include abdominal pain.   Past Medical History  Diagnosis Date   . Hepatitis C reactive     LIVER BX 2008-CHRONIC ACTIVE HEPATITIS  . Back problem   . GERD (gastroesophageal reflux disease)   . IBS (irritable bowel syndrome)   . HTN (hypertension)   . Hypercholesterolemia   . Hx of adenomatous colonic polyps 2008    due for surveillance 2018  . Knee pain   . Substance abuse     narcotic addiction  . Thyroiditis     History of  . Normal echocardiogram 05/25/2011    EF 55%, mild TR  . Normal cardiac stress test 05/25/2011    [facility] [place]  . Depression   . Hepatitis C 1979   Past Surgical History  Procedure Laterality Date  . Appendectomy    . Tonsillectomy    . Carpal tunnel release    . Right ring finger    . Spinal stenosis, had screws put in neck  04/11/2009    DR KRITZER  . Upper gastrointestinal endoscopy  2008 [facility] ABD PAIN WEIGHT LOSS d100 v8 phen 12.5    NL  . Colonoscopy  2008 [facility] ARS D100 V8 PHEN 12.5    2 SIMPLE ADENOMAS (< 1 CM)  . Knee arthroscopy    . Ulnar nerve transposition    . Esophagogastroduodenoscopy  04/26/2012    [facility]: Non-erosive gastritis (inflammation) was found in the gastric antrum but no H.pylori; multiple biopsies (duodenal bx negative for Celiac)/ The mucosa of the esophagus appeared normal  . Eus  09/08/2012    Procedure: UPPER ENDOSCOPIC ULTRASOUND (EUS) LINEAR;  Surgeon: Melton Alar  Christella Hartigan, MD;  Location: [facility];  Service: Endoscopy;  Laterality: N/A;  . Neurostimulator implant     Family History  Problem Relation Age of Onset  . Heart defect      FAMILY HX  . Cancer Mother     BLADDER  . Cancer Sister     METS  . Emotional abuse Sister   . Stroke Sister   . Emotional abuse Sister   . Heart failure Father   . Stroke Father   . Alcohol abuse Father   . Colon cancer Neg Hx   . Colon polyps Neg Hx   . Drug abuse Neg Hx   . Schizophrenia Neg Hx   . Seizures Neg Hx   . Sexual abuse Neg Hx   . Physical abuse Neg Hx   . Dementia Paternal Aunt   . Alcohol abuse Paternal Uncle    . Dementia Paternal Uncle   . Dementia Paternal Grandmother   . ADD / ADHD Cousin   . Bipolar disorder Cousin   . Anxiety disorder Cousin   . Depression Cousin     Committed suicide  . Alcohol abuse Cousin   . OCD Cousin   . Paranoid behavior Cousin    History  Substance Use Topics  . Smoking status: Former Smoker -- 1.00 packs/day    Types: Cigarettes    Quit date: 08/13/1986  . Smokeless tobacco: Never Used     Comment: quit 20 + yrs ago  . Alcohol Use: 35.0 oz/week    70 drink(s) per week    Review of Systems  Constitutional: Negative for fever and chills.  HENT: Positive for neck pain.   Eyes: Negative.   Respiratory: Negative.   Cardiovascular: Negative.   Gastrointestinal: Positive for abdominal pain. Negative for bowel incontinence.  Genitourinary: Positive for flank pain. Negative for bladder incontinence.  Musculoskeletal: Positive for back pain.  Skin: Negative.   Neurological: Negative.  Negative for tingling and numbness.  Psychiatric/Behavioral: Negative.     Allergies  Levofloxacin; Amitiza; and Sulfa antibiotics  Home Medications   Current Outpatient Rx  Name  Route  Sig  Dispense  Refill  . amLODipine (NORVASC) 5 MG tablet      TAKE ONE TABLET BY MOUTH ONCE DAILY.   30 tablet   2   . aspirin-acetaminophen-caffeine (EXCEDRIN MIGRAINE) 250-250-65 MG per tablet   Oral   Take 2 tablets by mouth daily as needed for pain.         . clonazePAM (KLONOPIN) 0.5 MG tablet   Oral   Take 0.5 mg by mouth 2 (two) times daily as needed. For anxiety         . cloNIDine (CATAPRES) 0.1 MG tablet   Oral   Take 0.1 mg by mouth 2 (two) times daily.         Marland Kitchen dimenhyDRINATE (DRAMAMINE) 50 MG tablet   Oral   Take 50 mg by mouth every 8 (eight) hours as needed (for nausea).         . ergocalciferol (VITAMIN D2) 50000 UNITS capsule   Oral   Take 1 capsule (50,000 Units total) by mouth once a week. One capsule once weekly   12 capsule   1   .  fentaNYL (DURAGESIC - DOSED MCG/HR) 25 MCG/HR   Transdermal   Place 1 patch onto the skin every 3 (three) days.         Marland Kitchen FLUoxetine (PROZAC) 20 MG capsule   Oral   Take  1 capsule (20 mg total) by mouth daily before breakfast.   30 capsule   2   . gabapentin (NEURONTIN) 300 MG capsule   Oral   Take 300 mg by mouth 3 (three) times daily.         Marland Kitchen HYDROcodone-acetaminophen (NORCO) 7.5-325 MG per tablet   Oral   Take 1 tablet by mouth 3 (three) times daily as needed. Pain         . ibuprofen (ADVIL,MOTRIN) 200 MG tablet   Oral   Take 400-600 mg by mouth every 6 (six) hours as needed. For pain         . lansoprazole (PREVACID) 15 MG capsule   Oral   Take 15-30 mg by mouth daily.         . methocarbamol (ROBAXIN) 750 MG tablet      TAKE (1) TABLET BY MOUTH (3) TIMES DAILY.   90 tablet   1   . penicillin v potassium (VEETID) 500 MG tablet   Oral   Take 1 tablet (500 mg total) by mouth 3 (three) times daily.   21 tablet   0   . Probiotic Product (ALIGN) 4 MG CAPS   Oral   Take 1 capsule by mouth daily.         . QUEtiapine (SEROQUEL) 50 MG tablet   Oral   Take 1-2 tablets (50-100 mg total) by mouth 2 (two) times daily. Take by mouth 1 tablet 3 times daily and 2 at bedtime   150 tablet   2    BP 132/79  Pulse 84  Temp(Src) 97.9 F (36.6 C) (Oral)  Ht 5\' 10"  (1.778 m)  Wt 160 lb (72.576 kg)  BMI 22.96 kg/m2  SpO2 99% Physical Exam  Nursing note and vitals reviewed. Constitutional: He is oriented to person, place, and time. He appears well-developed and well-nourished.  HENT:  Head: Normocephalic and atraumatic.  Right Ear: External ear normal.  Left Ear: External ear normal.  Mouth/Throat: Oropharynx is clear and moist.  Eyes: Conjunctivae and EOM are normal. Pupils are equal, round, and reactive to light.  Neck: Normal range of motion. Neck supple.  Pain localized over the posterior cervical paraspinous muscles bilaterally.  No palpable bony  deformity.  Cardiovascular: Normal rate, regular rhythm and normal heart sounds.   Pulmonary/Chest: Effort normal and breath sounds normal.  Abdominal: Soft. Bowel sounds are normal.  Musculoskeletal:  He has a surgical scar in the area of T12-L2 in the midline, with what may be a steel suture palpable in the subcutaneous tissue at the inferior end of the scar. There is no bony deformity, and no redness, heat or tenderness over this scar.  He has an implanted device in the right lumbar region, lying just above the right posterior iliac crest, also no redness, heat or tenderness.  Neurological: He is alert and oriented to person, place, and time.  No sensory or motor deficit.  Skin: Skin is warm and dry.  Psychiatric:  Depressed affect.    ED Course  Procedures (including critical care time)  3:41 PM Pt was seen and had physical examination.  IM Dilaudid and Zofran were ordered for his breakthrough pain.  Old charts were reviewed, and the [place] DMHDDSAS database was queried, which showed monthly prescriptions for Fentanyl patches, hydrocodone-acetaminophen 7.5/325, and clonazepam 0.5 mg.    5:21 PM Pt says he is "almost over the hump."  Will remedicate with additional injection of Dilaudid, and then release.  No results found. 1. Chronic pain syndrome   2. Degeneration of thoracic or thoracolumbar intervertebral disc   3. Pain in thoracic spine   4. Cervicalgia      Carleene Cooper III, MD 02/01/13 1725

## 2013-02-02 ENCOUNTER — Other Ambulatory Visit (HOSPITAL_COMMUNITY): Payer: Self-pay | Admitting: Physical Medicine and Rehabilitation

## 2013-02-02 DIAGNOSIS — M549 Dorsalgia, unspecified: Secondary | ICD-10-CM

## 2013-02-03 ENCOUNTER — Ambulatory Visit (HOSPITAL_COMMUNITY)
Admission: RE | Admit: 2013-02-03 | Discharge: 2013-02-03 | Disposition: A | Discharge status: Home / Self Care | Payer: PRIVATE HEALTH INSURANCE | Source: Ambulatory Visit | Attending: Physical Medicine and Rehabilitation | Admitting: Physical Medicine and Rehabilitation

## 2013-02-03 ENCOUNTER — Ambulatory Visit (HOSPITAL_COMMUNITY): Payer: PRIVATE HEALTH INSURANCE

## 2013-02-03 DIAGNOSIS — M5124 Other intervertebral disc displacement, thoracic region: Secondary | ICD-10-CM | POA: Insufficient documentation

## 2013-02-03 DIAGNOSIS — M47814 Spondylosis without myelopathy or radiculopathy, thoracic region: Secondary | ICD-10-CM | POA: Insufficient documentation

## 2013-02-03 DIAGNOSIS — M549 Dorsalgia, unspecified: Secondary | ICD-10-CM

## 2013-02-14 ENCOUNTER — Other Ambulatory Visit (HOSPITAL_COMMUNITY): Payer: Self-pay | Admitting: Physical Medicine and Rehabilitation

## 2013-02-14 ENCOUNTER — Ambulatory Visit (HOSPITAL_COMMUNITY)
Admission: RE | Admit: 2013-02-14 | Discharge: 2013-02-14 | Disposition: A | Discharge status: Home / Self Care | Payer: PRIVATE HEALTH INSURANCE | Source: Ambulatory Visit | Attending: Physical Medicine and Rehabilitation | Admitting: Physical Medicine and Rehabilitation

## 2013-02-14 DIAGNOSIS — M542 Cervicalgia: Secondary | ICD-10-CM

## 2013-02-14 DIAGNOSIS — Z981 Arthrodesis status: Secondary | ICD-10-CM | POA: Insufficient documentation

## 2013-03-30 ENCOUNTER — Other Ambulatory Visit: Payer: Self-pay | Admitting: Family Medicine

## 2013-04-06 ENCOUNTER — Other Ambulatory Visit: Payer: Self-pay | Admitting: Family Medicine

## 2013-04-12 ENCOUNTER — Other Ambulatory Visit: Payer: Self-pay

## 2013-04-12 ENCOUNTER — Encounter (HOSPITAL_COMMUNITY): Payer: Self-pay | Admitting: Psychiatry

## 2013-04-12 ENCOUNTER — Ambulatory Visit (HOSPITAL_COMMUNITY): Payer: Self-pay | Admitting: Psychiatry

## 2013-04-12 ENCOUNTER — Ambulatory Visit (INDEPENDENT_AMBULATORY_CARE_PROVIDER_SITE_OTHER): Payer: PRIVATE HEALTH INSURANCE | Admitting: Psychiatry

## 2013-04-12 VITALS — BP 120/86 | Ht 70.0 in | Wt 162.0 lb

## 2013-04-12 DIAGNOSIS — F331 Major depressive disorder, recurrent, moderate: Secondary | ICD-10-CM

## 2013-04-12 DIAGNOSIS — F329 Major depressive disorder, single episode, unspecified: Secondary | ICD-10-CM

## 2013-04-12 DIAGNOSIS — G4700 Insomnia, unspecified: Secondary | ICD-10-CM

## 2013-04-12 MED ORDER — ERGOCALCIFEROL 1.25 MG (50000 UT) PO CAPS: 50000.0000 [IU] | ORAL_CAPSULE | ORAL | Status: DC

## 2013-04-12 MED ORDER — FLUOXETINE HCL 20 MG PO CAPS: 20.0000 mg | ORAL_CAPSULE | Freq: Every day | ORAL | Status: DC

## 2013-04-12 MED ORDER — QUETIAPINE FUMARATE 50 MG PO TABS: 50.0000 mg | ORAL_TABLET | Freq: Two times a day (BID) | ORAL | Status: DC

## 2013-04-12 MED ORDER — CLONIDINE HCL 0.1 MG PO TABS: 0.1000 mg | ORAL_TABLET | Freq: Two times a day (BID) | ORAL | Status: DC

## 2013-04-12 MED ORDER — AMLODIPINE BESYLATE 5 MG PO TABS: 5.0000 mg | ORAL_TABLET | Freq: Every day | ORAL | Status: DC

## 2013-04-12 MED ORDER — METHOCARBAMOL 750 MG PO TABS: 750.0000 mg | ORAL_TABLET | Freq: Three times a day (TID) | ORAL | Status: DC

## 2013-04-12 MED ORDER — CLONAZEPAM 0.5 MG PO TABS: 0.5000 mg | ORAL_TABLET | Freq: Three times a day (TID) | ORAL | Status: DC | PRN

## 2013-04-12 NOTE — Progress Notes (Signed)
Patient ID: Marc [name], male   DOB: 1956/01/17, 57 y.o.   MRN: [id number] [facility] [address] Progress Note Marc [name] MRN: [id number] DOB: June 05, 1956 Age: 57 y.o.  Date: 04/12/2013 Start Time: 1:15 PM End Time: 1:35 PM  Chief Complaint: Chief Complaint  Patient presents with  . Depression  . Anxiety  . Follow-up  . 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 He has one son age 43 years old and lives in [place]  The patient states he has a history depression that started around 2009. Prior to that year he been treated with ribavirin to treat hepatitis C which made him very depressed. He's also had a number of deaths in his family-both sisters have died as well as both parents in the last several years. His son is very close to him and he only talks to him periodically.  Currently the patient is struggling with medical problems including neck and back pain. He recently had a neurostimulator installed in his back which is helped to some degree. Lately he's been a bit more depressed because his aunt keeps calling and wanting to talk about all the dead relatives people he's been draining a lot about people who have died. He's not been seeing his counselor regularly and I think it would be helpful for him to get back into counseling. He does have friends and he walks his dog 2 miles a day. He admits to passive suicidal ideation but would never hurt himself.  .  Current psychiatric medication Prozac 20 mg in AM Seroquel 50 mg 3 times a day and 2 at bedtime   Klonopin 0.5 mg 1-2 tablet prn prescribed by Dr. Lennie Hummer. Vitamin D 50,000 IU weekly prescribed by PCP  Past psychiatric history Patient has history of depression for many years. He has taken in the past Wellbutrin Cymbalta and Celexa. He denies any history of  suicidal attempt.  Alcohol and substance use history Patient has significant history of alcohol with multiple rehabilitation and detox treatment. He has history of DWI. He also had history of taking recreational drugs and he was in the [employer]. He has history of intravenous drug use that cause hepatitis C. Patient also had history of using pain medication.  Allergies: Allergies  Allergen Reactions  . Levofloxacin Nausea Only and Other (See Comments)    "Creepy" feeling, skin didn't feel right, sort of itchy.  Valinda Hoar [Lubiprostone]     MADE HIM FEEL STIFF  . Sulfa Antibiotics    Medical History: Past Medical History  Diagnosis Date  . Hepatitis C reactive     LIVER BX 2008-CHRONIC ACTIVE HEPATITIS  . Back problem   . GERD (gastroesophageal reflux disease)   . IBS (irritable bowel syndrome)   . HTN (hypertension)   . Hypercholesterolemia   . Hx of adenomatous colonic polyps 2008    due for surveillance 2018  . Knee pain   . Substance abuse     narcotic addiction  . Thyroiditis     History of  . Normal echocardiogram 05/25/2011    EF 55%, mild TR  . Normal cardiac stress test 05/25/2011    [facility]  . Depression   . Hepatitis C 1979  Cervical fusion Neck surgery Neck pain Hepatitis C Hypertension Benign tremors High blood pressure He  sees Dr. Lodema Hong and Dr. Harrel Carina for pain management.  Surgical History: Past Surgical History  Procedure Laterality Date  . Appendectomy    . Tonsillectomy    . Carpal tunnel release    . Right ring finger    . Spinal stenosis, had screws put in neck  04/11/2009    DR KRITZER  . Upper gastrointestinal endoscopy  01-04-2007 [facility] ABD PAIN WEIGHT LOSS d100 v8 phen 12.5    NL  . Colonoscopy  01-04-07 [facility] ARS D100 V8 PHEN 12.5    2 SIMPLE ADENOMAS (< 1 CM)  . Knee arthroscopy    . Ulnar nerve transposition    . Esophagogastroduodenoscopy  04/26/2012    SLF: Non-erosive gastritis (inflammation) was found in the gastric antrum  but no H.pylori; multiple biopsies (duodenal bx negative for Celiac)/ The mucosa of the esophagus appeared normal  . Eus  09/08/2012    Procedure: UPPER ENDOSCOPIC ULTRASOUND (EUS) LINEAR;  Surgeon: Rachael Fee, MD;  Location: [facility];  Service: Endoscopy;  Laterality: N/A;  . Neurostimulator implant     Family History: family history includes ADD / ADHD in his cousin; Alcohol abuse in his cousin, father, and paternal uncle; Anxiety disorder in his cousin; Bipolar disorder in his cousin; Cancer in his mother and sister; Dementia in his paternal aunt, paternal grandmother, and paternal uncle; Depression in his cousin; Emotional abuse in his sister and sister; Heart defect in an other family member; Heart failure in his father; OCD in his cousin; Paranoid behavior in his cousin; Stroke in his father and sister. There is no history of Colon cancer, Colon polyps, Drug abuse, Schizophrenia, Seizures, Sexual abuse, or Physical abuse. Reviewed again in the office and no changes were noted except the new pain issues  Psychosocial history Patient was born and raised in [place].  He married twice. 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 Patient has multiple family member who has died in past few years. His father died in 2008/01/04 and mother died in 2009/01/03  and 2 of his sister also died.  Patient lives by himself.  Mental status examination Patient is casually dressed and groomed. He is tired but cooperative.  He maintained fair eye contact. He described his mood tired and his affect is constricted.  His speech is slow but clear. His thought process is slow but logical linear and goal-directed.  He has mild tremors which are chronic .  He denies any active or passive suicidal thinking or homicidal thinking. He denies any auditory or visual hallucination. He is alert and oriented x3.  His attention and concentration is distracted. There were no paranoia or delusions  present at this time. His insight judgment and impulse control is okay.  Diagnosis Axis I Maj. depressive disorder, polysubstance dependence by history Axis II deferred Axis III see medical history Axis IV mild to moderate Axis V 65-70  Plan/Discussion: I took his vitals.  I reviewed CC, tobacco/med/surg Hx, meds effects/ side effects, problem list, therapies and responses as well as current situation/symptoms discussed options. Continue current effective medications. But add one extra dose of clonazepam 0.5 mg at bedtime to help with his nightmares. He'll restart his counseling with Peggy and return to see me in 3 months See orders and pt instructions for more details.  Medical Decision Making Problem Points:  Established problem, stable/improving (1), Review of last therapy session (1) and Review of psycho-social stressors (1) Data Points:  Review  or order clinical lab tests (1) Review of medication regiment & side effects (2)  I certify that outpatient services furnished can reasonably be expected to improve the patient's condition.   Diannia Ruder, MD

## 2013-04-14 ENCOUNTER — Other Ambulatory Visit (HOSPITAL_COMMUNITY): Payer: Self-pay | Admitting: Neurosurgery

## 2013-04-14 DIAGNOSIS — M542 Cervicalgia: Secondary | ICD-10-CM

## 2013-04-17 ENCOUNTER — Ambulatory Visit (HOSPITAL_COMMUNITY): Payer: PRIVATE HEALTH INSURANCE

## 2013-04-18 ENCOUNTER — Telehealth (HOSPITAL_COMMUNITY): Payer: Self-pay | Admitting: Psychiatry

## 2013-04-18 NOTE — Telephone Encounter (Signed)
Refilled via phone

## 2013-04-20 ENCOUNTER — Ambulatory Visit (HOSPITAL_COMMUNITY)
Admission: RE | Admit: 2013-04-20 | Discharge: 2013-04-20 | Disposition: A | Discharge status: Home / Self Care | Payer: PRIVATE HEALTH INSURANCE | Source: Ambulatory Visit | Attending: Neurosurgery | Admitting: Neurosurgery

## 2013-04-20 ENCOUNTER — Ambulatory Visit (INDEPENDENT_AMBULATORY_CARE_PROVIDER_SITE_OTHER): Payer: PRIVATE HEALTH INSURANCE | Admitting: Psychiatry

## 2013-04-20 DIAGNOSIS — M542 Cervicalgia: Secondary | ICD-10-CM | POA: Insufficient documentation

## 2013-04-20 DIAGNOSIS — M5020 Other cervical disc displacement, unspecified cervical region: Secondary | ICD-10-CM | POA: Insufficient documentation

## 2013-04-20 DIAGNOSIS — M25519 Pain in unspecified shoulder: Secondary | ICD-10-CM | POA: Insufficient documentation

## 2013-04-20 DIAGNOSIS — M5030 Other cervical disc degeneration, unspecified cervical region: Secondary | ICD-10-CM | POA: Insufficient documentation

## 2013-04-20 DIAGNOSIS — F331 Major depressive disorder, recurrent, moderate: Secondary | ICD-10-CM

## 2013-04-20 NOTE — Patient Instructions (Signed)
Discussed orally 

## 2013-04-20 NOTE — Progress Notes (Signed)
.   Patient:  Marc [name]   DOB: 03-10-1956  MR Number: [id number]  Location: [facility]:  [address] [place],  [place], [address]  Start: Thursday 04/20/2013 2:00 PM End: Thursday 04/20/2013 2:55 PM  Provider/Observer:     Florencia Reasons, MSW, LCSW   Chief Complaint:      Chief Complaint  Patient presents with  . Depression  . Anxiety    Reason For Service:     The patient initially was referred by Dr. Lolly Mustache to improve coping and social skills as patient was experiencing sadness, depression, grief, and anger issues. The patient has a history of recurrent periods of depression along with a history of chemical dependency. He has had psychiatric hospitalization due to suicidal thoughts and has participated in inpatient treatment as well as outpatient treatment for chemical dependency. He completed the chemical dependency IOP program in [place] in December 2012. Patient is seen today for a follow-up appointment.  Interventions Strategy:  Supportive therapy, cognitive behavioral therapy  Participation Level:   Active  Participation Quality:  Appropriate and Sharing      Behavioral Observation:  Casual , appropriate  Current Psychosocial Factors: Patient reports continued financial issues, resuming a relationship with a friend with whom he's had difficulties setting boundaries in the past,  Content of Session:   Reviewing symptoms, processing feelings, discussing boundary issues, identifying coping tools  Current Status:   The patient continued depressed mood and anxiety.   Patient Progress:   The patient report  improved quality of sleep since taking increased dosage of medication as prescribed by Dr. Tenny Craw. However, he has been experiencing increased mood swings and irritability due to to neck pain. He has a followup appointment with his doctor. He also expresses frustration about the implant in his back as he is experiencing discomfort from the device although it has  alleviated some of the back pain. Therapist encourages patient to followup with the surgeon who implanted the device which patient agrees to do next week. Therapist expresses frustration regarding resuming the relationship with the friend with whom he's had difficulties setting boundaries in the past. Per patient's report, the friend has resumed old patterns including asking patient for financial assistance. Therapist works with patient to process his feelings and to identify ways to set and maintain boundaries. Therapist also encourages patient to resume journaling.   Target Goals:    Decrease anxiety, increasing self acceptance  Last Reviewed:   08/14/2011  Goals Addressed Today:    Decrease anxiety  Impression/Diagnosis:   The patient has a long-standing history of recurrent periods of depression and continues to experience sadness, anger, depressed mood, and poor concentration. Diagnoses: Maj. depressive disorder, recurrent  Diagnosis:  Axis I:  Major depressive disorder, recurrent, moderate          Axis II: Deferred

## 2013-04-24 ENCOUNTER — Encounter: Payer: Self-pay | Admitting: Family Medicine

## 2013-04-24 ENCOUNTER — Ambulatory Visit (INDEPENDENT_AMBULATORY_CARE_PROVIDER_SITE_OTHER): Payer: PRIVATE HEALTH INSURANCE | Admitting: Family Medicine

## 2013-04-24 VITALS — BP 112/80 | HR 73 | Resp 16 | Wt 164.0 lb

## 2013-04-24 DIAGNOSIS — E559 Vitamin D deficiency, unspecified: Secondary | ICD-10-CM

## 2013-04-24 DIAGNOSIS — H9201 Otalgia, right ear: Secondary | ICD-10-CM

## 2013-04-24 DIAGNOSIS — I10 Essential (primary) hypertension: Secondary | ICD-10-CM

## 2013-04-24 DIAGNOSIS — Z125 Encounter for screening for malignant neoplasm of prostate: Secondary | ICD-10-CM

## 2013-04-24 DIAGNOSIS — H833X9 Noise effects on inner ear, unspecified ear: Secondary | ICD-10-CM

## 2013-04-24 DIAGNOSIS — Z23 Encounter for immunization: Secondary | ICD-10-CM

## 2013-04-24 DIAGNOSIS — E785 Hyperlipidemia, unspecified: Secondary | ICD-10-CM

## 2013-04-24 DIAGNOSIS — R7309 Other abnormal glucose: Secondary | ICD-10-CM

## 2013-04-24 DIAGNOSIS — H9209 Otalgia, unspecified ear: Secondary | ICD-10-CM

## 2013-04-24 DIAGNOSIS — F329 Major depressive disorder, single episode, unspecified: Secondary | ICD-10-CM

## 2013-04-24 DIAGNOSIS — H833X1 Noise effects on right inner ear: Secondary | ICD-10-CM

## 2013-04-24 MED ORDER — CLONIDINE HCL 0.1 MG PO TABS: 0.1000 mg | ORAL_TABLET | Freq: Two times a day (BID) | ORAL | Status: DC

## 2013-04-24 MED ORDER — AMLODIPINE BESYLATE 5 MG PO TABS: 5.0000 mg | ORAL_TABLET | Freq: Every day | ORAL | Status: DC

## 2013-04-24 NOTE — Patient Instructions (Addendum)
F/u in 4 month, call if you ned me before  Fasting CBc, lipid , cmp, TSH and PSA Novemebr 15 , or within that week please  Flu vaccine today  You are referred to ENT , DrTeoh for right ear complaints for the past 1 month  I will try locate your last colonoscopy and follow through on this   I will contact Dr Jeannette Corpus re fact that psychiatry is now precribing your xanax , but that you will continue to see him re tremors, which you need to do

## 2013-04-24 NOTE — Progress Notes (Signed)
  Subjective:    Patient ID: MARIE CHOW, male    DOB: [birth year], 57 y.o.   MRN: [id number]  HPI The PT is here for follow up and re-evaluation of chronic medical conditions, medication management and review of any available recent lab and radiology data.  Preventive health is updated, specifically  Cancer screening and Immunization.   Questions or concerns regarding consultations or procedures which the PT has had in the interim are  Addressed.Seeing both psych and neurology, appears to be duplication in anxiolytic prescriptions from each and they will be notified about this 1 month h/o right ear pain and fullness, denies sinus pressure, drainage, sore throat or productive cough, no fever or chills.States he has hearing loss in right ear also Does have neck pain which translates to headaches at times taking him to the ED    Review of Systems See HPI Denies recent fever or chills. Denies sinus pressure, nasal congestion, or sore throat. Denies chest congestion, productive cough or wheezing. Denies chest pains, palpitations and leg swelling Denies abdominal pain, nausea, vomiting,diarrhea or constipation.   Denies dysuria, frequency, hesitancy or incontinence. Chronic pain is unchnaged Denies , seizures, numbness, or tingling. Denies uncontrolled  depression, anxiety or insomnia. Denies skin break down or rash.        Objective:   Physical Exam Patient alert and oriented and in no cardiopulmonary distress.  HEENT: No facial asymmetry, EOMI, no sinus tenderness,  oropharynx pink and moist.  Neck supple no adenopathy.TM clear bilaterally with good light reflex  Chest: Clear to auscultation bilaterally.  CVS: S1, S2 no murmurs, no S3.  ABD: Soft non tender. Bowel sounds normal.  Ext: No edema  MS: Adequate ROM spine, shoulders, hips and knees.  Skin: Intact, no ulcerations or rash noted.  Psych: Good eye contact, flat affect. Memory intact not anxious or depressed  appearing.  CNS: CN 2-12 intact, power, tone and sensation normal throughout.        Assessment & Plan:

## 2013-04-25 ENCOUNTER — Ambulatory Visit: Payer: Self-pay | Admitting: Family Medicine

## 2013-05-15 ENCOUNTER — Telehealth: Payer: Self-pay | Admitting: Family Medicine

## 2013-05-15 NOTE — Telephone Encounter (Signed)
No note of call

## 2013-05-18 ENCOUNTER — Ambulatory Visit (INDEPENDENT_AMBULATORY_CARE_PROVIDER_SITE_OTHER): Payer: Self-pay | Admitting: Otolaryngology

## 2013-05-25 ENCOUNTER — Ambulatory Visit (HOSPITAL_COMMUNITY): Payer: Self-pay | Admitting: Psychiatry

## 2013-05-31 ENCOUNTER — Other Ambulatory Visit: Payer: Self-pay | Admitting: Family Medicine

## 2013-06-01 ENCOUNTER — Ambulatory Visit (INDEPENDENT_AMBULATORY_CARE_PROVIDER_SITE_OTHER): Payer: PRIVATE HEALTH INSURANCE | Admitting: Otolaryngology

## 2013-06-01 DIAGNOSIS — H6120 Impacted cerumen, unspecified ear: Secondary | ICD-10-CM

## 2013-06-01 DIAGNOSIS — H6990 Unspecified Eustachian tube disorder, unspecified ear: Secondary | ICD-10-CM

## 2013-06-01 DIAGNOSIS — H6980 Other specified disorders of Eustachian tube, unspecified ear: Secondary | ICD-10-CM

## 2013-06-01 DIAGNOSIS — H903 Sensorineural hearing loss, bilateral: Secondary | ICD-10-CM

## 2013-06-05 NOTE — Assessment & Plan Note (Signed)
Ongoing weekly vit D needs lab update

## 2013-06-05 NOTE — Assessment & Plan Note (Signed)
Patient educated about the importance of limiting  Carbohydrate intake , the need to commit to daily physical activity for a minimum of 30 minutes , and to commit weight loss. The fact that changes in all these areas will reduce or eliminate all together the development of diabetes is stressed.   updated lab needed 

## 2013-06-05 NOTE — Assessment & Plan Note (Signed)
6 week h/o right eatr pain, exam  normal, refer to ENT for further eval and management

## 2013-06-05 NOTE — Assessment & Plan Note (Signed)
Stable, and treated by psych, not suicidal or homicidal

## 2013-06-05 NOTE — Assessment & Plan Note (Signed)
Controlled, no change in medication DASH diet and commitment to daily physical activity for a minimum of 30 minutes discussed and encouraged, as a part of hypertension management. The importance of attaining a healthy weight is also discussed.  

## 2013-06-08 ENCOUNTER — Ambulatory Visit (INDEPENDENT_AMBULATORY_CARE_PROVIDER_SITE_OTHER): Payer: PRIVATE HEALTH INSURANCE | Admitting: Psychiatry

## 2013-06-08 DIAGNOSIS — F331 Major depressive disorder, recurrent, moderate: Secondary | ICD-10-CM

## 2013-06-08 NOTE — Patient Instructions (Signed)
Discussed orally 

## 2013-06-08 NOTE — Progress Notes (Signed)
.   Patient:  Marc [name]   DOB: [birth year]  MR Number: [id number]  Location: [facility]:  [address] [place],  [place], [address]  Start: Thursday 06/08/2013 1:10 PM End: Thursday 06/08/2013 2:00 PM  Provider/Observer:     Florencia Reasons, MSW, LCSW   Chief Complaint:      Chief Complaint  Patient presents with  . Anxiety  . Depression    Reason For Service:     The patient initially was referred by Dr. Lolly Mustache to improve coping and social skills as patient was experiencing sadness, depression, grief, and anger issues. The patient has a history of recurrent periods of depression along with a history of chemical dependency. He has had psychiatric hospitalization due to suicidal thoughts and has participated in inpatient treatment as well as outpatient treatment for chemical dependency. He completed the chemical dependency IOP program in [place] in December 2012. Patient is seen today for a follow-up appointment.  Interventions Strategy:  Supportive therapy, cognitive behavioral therapy  Participation Level:   Active  Participation Quality:  Appropriate and Sharing      Behavioral Observation:  Casual , appropriate  Current Psychosocial Factors: Patient reports continued financial issues, having  a relationship with a friend with whom he's had difficulties setting boundaries in the past,  Content of Session:   Reviewing symptoms, processing feelings, discussing boundary issues, identifying coping tools  Current Status:   The patient reports continued depressed mood and anxiety.   Patient Progress:   The patient states things have been up and down. He's had continued difficulty setting and maintaining boundaries with his friends. Therapist works with patient to process feelings and to identify ways to improve assertiveness skills and to challenge thinking errors. Patient also reports stress regarding financial issues related to paying back taxes and medical bills.  Patient states waking up worrying. Therapist works with patient to identify areas within his control and distinguishing those that aren't. Patient also expresses frustration regarding continued medical issues related to the implant in his back as well as pain in his arms and hands. Therapist works with patient to process feelings and to identify coping statements.    Target Goals:    Decrease anxiety, increasing self acceptance  Last Reviewed:   08/14/2011  Goals Addressed Today:    Decrease anxiety  Impression/Diagnosis:   The patient has a long-standing history of recurrent periods of depression and continues to experience sadness, anger, depressed mood, and poor concentration. Diagnoses: Maj. depressive disorder, recurrent  Diagnosis:  Axis I:  Major depressive disorder, recurrent, moderate          Axis II: Deferred

## 2013-06-29 ENCOUNTER — Other Ambulatory Visit: Payer: Self-pay | Admitting: Family Medicine

## 2013-06-29 LAB — COMPREHENSIVE METABOLIC PANEL
AST: 101 U/L — ABNORMAL HIGH (ref 0–37)
Alkaline Phosphatase: 101 U/L (ref 39–117)
BUN: 19 mg/dL (ref 6–23)
Calcium: 9.6 mg/dL (ref 8.4–10.5)
Creat: 1.02 mg/dL (ref 0.50–1.35)
Glucose, Bld: 113 mg/dL — ABNORMAL HIGH (ref 70–99)

## 2013-06-29 LAB — LIPID PANEL
Cholesterol: 193 mg/dL (ref 0–200)
HDL: 66 mg/dL (ref >39)
Total CHOL/HDL Ratio: 2.9 Ratio
Triglycerides: 77 mg/dL (ref <150)
VLDL: 15 mg/dL (ref 0–40)

## 2013-06-29 LAB — CBC
Hemoglobin: 14.5 g/dL (ref 13.0–17.0)
RBC: 4.51 MIL/uL (ref 4.22–5.81)
WBC: 3.9 10*3/uL — ABNORMAL LOW (ref 4.0–10.5)

## 2013-07-12 ENCOUNTER — Ambulatory Visit (HOSPITAL_COMMUNITY): Payer: Self-pay | Admitting: Psychiatry

## 2013-07-13 ENCOUNTER — Ambulatory Visit (INDEPENDENT_AMBULATORY_CARE_PROVIDER_SITE_OTHER): Payer: PRIVATE HEALTH INSURANCE | Admitting: Psychiatry

## 2013-07-13 DIAGNOSIS — F331 Major depressive disorder, recurrent, moderate: Secondary | ICD-10-CM

## 2013-07-13 NOTE — Progress Notes (Signed)
.   Patient:  Marc [name]   DOB: Apr 20, 1956  MR Number: [id number]  Location: [facility]:  [address] [place],  [place], [address]  Start: Thursday 07/13/2013 1:15 PM End: Thursday 07/13/2013 1:55 PM  Provider/Observer:     Florencia Reasons, MSW, LCSW   Chief Complaint:      Chief Complaint  Patient presents with  . Depression    Reason For Service:     The patient initially was referred by Dr. Lolly Mustache to improve coping and social skills as patient was experiencing sadness, depression, grief, and anger issues. The patient has a history of recurrent periods of depression along with a history of chemical dependency. He has had psychiatric hospitalization due to suicidal thoughts and has participated in inpatient treatment as well as outpatient treatment for chemical dependency. He completed the chemical dependency IOP program in [place] in December 2012. Patient is seen today for a follow-up appointment.  Interventions Strategy:  Supportive therapy, cognitive behavioral therapy  Participation Level:   Active  Participation Quality:  Appropriate and Sharing      Behavioral Observation:  Casual , appropriate  Current Psychosocial Factors: having  a relationship with a friend with whom he's had difficulties setting boundaries in the past, patient is in the disability application process  Content of Session:   Reviewing symptoms, processing feelings, identifying ways to set and maintain boundaries in the relationship with his friends, identifying realistic expectations versus reasonable expectations, identifying coping tools  Current Status:   The patient reports continued depressed mood and anxiety. He reports passive suicidal ideations at times with no intent and no plan saying he wishes God would take him. He denies current suicidal ideations.  Patient Progress:   The patient reports having sinusitis for the past 20 days. Breathing difficulty has elected to increased sleep  difficulty. Patient also reports being more depressed and worrying about seeing a doctor this afternoon as a part of the disability application process. Therapist works with patient to process his feelings and to identify negative thought patterns along with identifying and challenging cognitive distortions. He expresses anger, frustration, and resentment about his friend who does not keep promises regarding seeing him. He reports his friend is having marital stress and also suffers from bipolar disorder. Therapist works with patient to process his feelings, identifying realistic expectations in the relationship, and identify ways to set and maintain boundaries in the relationship. Patient also reports increased thoughts about his deceased family members and sadness during the holidays. Therapist works with patient to identify healthy ways to cope with the holidays. He continues to maintain a connection with his cousin who lives in [place]. Patient also has been attending Bible study regularly and has resumed journaling. He also has maintained abstinence from alcohol and drug use.    Target Goals:    Decrease anxiety, increasing self acceptance  Last Reviewed:   08/14/2011  Goals Addressed Today:    Decrease anxiety  Impression/Diagnosis:   The patient has a long-standing history of recurrent periods of depression and continues to experience sadness, anger, depressed mood, and poor concentration. Diagnoses: Maj. depressive disorder, recurrent  Diagnosis:  Axis I:  Major depressive disorder, recurrent, moderate          Axis II: Deferred

## 2013-07-13 NOTE — Patient Instructions (Signed)
Discussed orally 

## 2013-07-20 ENCOUNTER — Encounter (HOSPITAL_COMMUNITY): Payer: Self-pay | Admitting: Psychiatry

## 2013-07-20 ENCOUNTER — Ambulatory Visit (INDEPENDENT_AMBULATORY_CARE_PROVIDER_SITE_OTHER): Payer: PRIVATE HEALTH INSURANCE | Admitting: Psychiatry

## 2013-07-20 VITALS — BP 130/86 | Ht 70.0 in | Wt 164.0 lb

## 2013-07-20 DIAGNOSIS — F329 Major depressive disorder, single episode, unspecified: Secondary | ICD-10-CM

## 2013-07-20 DIAGNOSIS — G4700 Insomnia, unspecified: Secondary | ICD-10-CM

## 2013-07-20 DIAGNOSIS — F331 Major depressive disorder, recurrent, moderate: Secondary | ICD-10-CM

## 2013-07-20 MED ORDER — CLONAZEPAM 0.5 MG PO TABS: 0.5000 mg | ORAL_TABLET | Freq: Three times a day (TID) | ORAL | Status: DC | PRN

## 2013-07-20 MED ORDER — GABAPENTIN 400 MG PO CAPS: 400.0000 mg | ORAL_CAPSULE | Freq: Three times a day (TID) | ORAL | Status: DC

## 2013-07-20 MED ORDER — FLUOXETINE HCL 20 MG PO CAPS: 20.0000 mg | ORAL_CAPSULE | Freq: Every day | ORAL | Status: DC

## 2013-07-20 MED ORDER — QUETIAPINE FUMARATE 50 MG PO TABS: 50.0000 mg | ORAL_TABLET | Freq: Two times a day (BID) | ORAL | Status: DC

## 2013-07-20 NOTE — Progress Notes (Signed)
Patient ID: Marc [name], male   DOB: 01/16/1956, 57 y.o.   MRN: [id number] Patient ID: Marc [name], male   DOB: 1956/05/11, 57 y.o.   MRN: [id number] Marc [name] Progress Note Marc [name] MRN: [id number] DOB: 01-24-1956 Age: 57 y.o.  Date: 07/20/2013 Start Time: 1:15 PM End Time: 1:35 PM  Chief Complaint: Chief Complaint  Patient presents with  . Anxiety  . Depression  . 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 He has one son age 73 years old and lives in [place]  The patient states he has a history depression that started around 2009. Prior to that year he been treated with ribavirin to treat hepatitis C which made him very depressed. He's also had a number of deaths in his family-both sisters have died as well as both parents in the last several years. His son is very close to him and he only talks to him periodically.  Currently the patient is struggling with medical problems including neck and back pain. He recently had a neurostimulator installed in his back which is helped to some degree. Lately he's been a bit more depressed because his aunt keeps calling and wanting to talk about all the dead relatives people he's been draining a lot about people who have died. He's not been seeing his counselor regularly and I think it would be helpful for him to get back into counseling. He does have friends and he walks his dog 2 miles a day. He admits to passive suicidal ideation but would never hurt himself.  The patient returns for followup after 3 months. He's been under the weather. He had a bad sinus infection and he still has chronic neck pain even with a neurostimulator implanted. He's no longer on Duragesic. He also feels somewhat anxious and states that all the medicines like Prozac can cause this but he  doesn't really want to change. I told him we could increase his Neurontin which may help anxiety and the neuropathic pain as well. He is still trying to get out and do things he is not suicidal.  .  Current psychiatric medication Prozac 20 mg in AM Seroquel 50 mg 3 times a day and 2 at bedtime   Klonopin 0.5 mg 1-2 tablet  Vitamin D 50,000 IU weekly prescribed by PCP  Past psychiatric history Patient has history of depression for many years. He has taken in the past Wellbutrin Cymbalta and Celexa. He denies any history of suicidal attempt.  Alcohol and substance use history Patient has significant history of alcohol with multiple rehabilitation and detox treatment. He has history of DWI. He also had history of taking recreational drugs and he was in the [employer]. He has history of intravenous drug use that cause hepatitis C. Patient also had history of using pain medication.  Allergies: Allergies  Allergen Reactions  . Levofloxacin Nausea Only and Other (See Comments)    "Creepy" feeling, skin didn't feel right, sort of itchy.  Valinda Hoar [Lubiprostone]     MADE HIM FEEL STIFF  . Sulfa Antibiotics    Medical History: Past Medical History  Diagnosis Date  . Hepatitis C reactive     LIVER BX 2008-CHRONIC ACTIVE HEPATITIS  . Back problem   . GERD (gastroesophageal reflux disease)   . IBS (irritable bowel syndrome)   .  HTN (hypertension)   . Hypercholesterolemia   . Hx of adenomatous colonic polyps Dec 22, 2006    due for surveillance Dec 21, 2016  . Knee pain   . Substance abuse     narcotic addiction  . Thyroiditis     History of  . Normal echocardiogram 05/25/2011    EF 55%, mild TR  . Normal cardiac stress test 05/25/2011    [facility]  . Depression   . Hepatitis C 1979  Cervical fusion Neck surgery Neck pain Hepatitis C Hypertension Benign tremors High blood pressure He sees Dr. Lodema Hong and Dr. Harrel Carina for pain management.  Surgical History: Past Surgical  History  Procedure Laterality Date  . Appendectomy    . Tonsillectomy    . Carpal tunnel release    . Right ring finger    . Spinal stenosis, had screws put in neck  04/11/2009    DR KRITZER  . Upper gastrointestinal endoscopy  12-22-06 [facility] ABD PAIN WEIGHT LOSS d100 v8 phen 12.5    NL  . Colonoscopy  12-22-06 [facility] ARS D100 V8 PHEN 12.5    2 SIMPLE ADENOMAS (< 1 CM)  . Knee arthroscopy    . Ulnar nerve transposition    . Esophagogastroduodenoscopy  04/26/2012    [facility]: Non-erosive gastritis (inflammation) was found in the gastric antrum but no H.pylori; multiple biopsies (duodenal bx negative for Celiac)/ The mucosa of the esophagus appeared normal  . Eus  09/08/2012    Procedure: UPPER ENDOSCOPIC ULTRASOUND (EUS) LINEAR;  Surgeon: Rachael Fee, MD;  Location: [facility];  Service: Endoscopy;  Laterality: N/A;  . Neurostimulator implant     Family History: family history includes ADD / ADHD in his cousin; Alcohol abuse in his cousin, father, and paternal uncle; Anxiety disorder in his cousin; Bipolar disorder in his cousin; Cancer in his mother and sister; Dementia in his paternal aunt, paternal grandmother, and paternal uncle; Depression in his cousin; Emotional abuse in his sister and sister; Heart defect in an other family member; Heart failure in his father; OCD in his cousin; Paranoid behavior in his cousin; Stroke in his father and sister. There is no history of Colon cancer, Colon polyps, Drug abuse, Schizophrenia, Seizures, Sexual abuse, or Physical abuse. Reviewed again in the office and no changes were noted except the new pain issues  Psychosocial history Patient was born and raised in [place].  He married twice. 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 Patient has multiple family member who has died in past few years. His father died in 12-22-07 and mother died in 12/21/08  and 2 of his sister also died.  Patient lives by himself.  Mental status  examination Patient is casually dressed and groomed. He is tired but cooperative.  He maintained fair eye contact. He described his mood tired and his affect is constricted.  His speech is slow but clear. His thought process is slow but logical linear and goal-directed.  He has mild tremors which are chronic .  He denies any active or passive suicidal thinking or homicidal thinking. He denies any auditory or visual hallucination. He is alert and oriented x3.  His attention and concentration is distracted. There were no paranoia or delusions present at this time. His insight judgment and impulse control is okay.  Diagnosis Axis I Maj. depressive disorder, polysubstance dependence by history Axis II deferred Axis III see medical history Axis IV mild to moderate Axis V 65-70  Plan/Discussion: I took his vitals.  I reviewed CC, tobacco/med/surg Hx, meds effects/ side effects, problem list, therapies and responses as well as current situation/symptoms discussed options. Continue current effective medications.he will increase Neurontin to 400 mg 3 times a day to He'll restart his counseling with Peggy and return to see me in 3 months See orders and pt instructions for more details.  Medical Decision Making Problem Points:  Established problem, stable/improving (1), Review of last therapy session (1) and Review of psycho-social stressors (1) Data Points:  Review or order clinical lab tests (1) Review of medication regiment & side effects (2)  I certify that outpatient services furnished can reasonably be expected to improve the patient's condition.   Diannia Ruder, MD

## 2013-07-25 ENCOUNTER — Other Ambulatory Visit: Payer: Self-pay | Admitting: Family Medicine

## 2013-08-06 ENCOUNTER — Encounter (HOSPITAL_COMMUNITY): Payer: Self-pay | Admitting: Emergency Medicine

## 2013-08-06 ENCOUNTER — Emergency Department (HOSPITAL_COMMUNITY)
Admission: EM | Admit: 2013-08-06 | Discharge: 2013-08-07 | Disposition: A | Discharge status: Home / Self Care | Payer: PRIVATE HEALTH INSURANCE | Attending: Emergency Medicine | Admitting: Emergency Medicine

## 2013-08-06 ENCOUNTER — Emergency Department (HOSPITAL_COMMUNITY): Payer: PRIVATE HEALTH INSURANCE

## 2013-08-06 DIAGNOSIS — Z860100 Personal history of colon polyps, unspecified: Secondary | ICD-10-CM | POA: Insufficient documentation

## 2013-08-06 DIAGNOSIS — S62101A Fracture of unspecified carpal bone, right wrist, initial encounter for closed fracture: Secondary | ICD-10-CM

## 2013-08-06 DIAGNOSIS — Z87891 Personal history of nicotine dependence: Secondary | ICD-10-CM | POA: Insufficient documentation

## 2013-08-06 DIAGNOSIS — R296 Repeated falls: Secondary | ICD-10-CM | POA: Insufficient documentation

## 2013-08-06 DIAGNOSIS — K219 Gastro-esophageal reflux disease without esophagitis: Secondary | ICD-10-CM | POA: Insufficient documentation

## 2013-08-06 DIAGNOSIS — I10 Essential (primary) hypertension: Secondary | ICD-10-CM | POA: Insufficient documentation

## 2013-08-06 DIAGNOSIS — Z8639 Personal history of other endocrine, nutritional and metabolic disease: Secondary | ICD-10-CM | POA: Insufficient documentation

## 2013-08-06 DIAGNOSIS — S52599A Other fractures of lower end of unspecified radius, initial encounter for closed fracture: Secondary | ICD-10-CM | POA: Insufficient documentation

## 2013-08-06 DIAGNOSIS — Z862 Personal history of diseases of the blood and blood-forming organs and certain disorders involving the immune mechanism: Secondary | ICD-10-CM | POA: Insufficient documentation

## 2013-08-06 DIAGNOSIS — F329 Major depressive disorder, single episode, unspecified: Secondary | ICD-10-CM | POA: Insufficient documentation

## 2013-08-06 DIAGNOSIS — Z8619 Personal history of other infectious and parasitic diseases: Secondary | ICD-10-CM | POA: Insufficient documentation

## 2013-08-06 DIAGNOSIS — Y929 Unspecified place or not applicable: Secondary | ICD-10-CM | POA: Insufficient documentation

## 2013-08-06 DIAGNOSIS — Y9302 Activity, running: Secondary | ICD-10-CM | POA: Insufficient documentation

## 2013-08-06 DIAGNOSIS — F3289 Other specified depressive episodes: Secondary | ICD-10-CM | POA: Insufficient documentation

## 2013-08-06 DIAGNOSIS — Z9089 Acquired absence of other organs: Secondary | ICD-10-CM | POA: Insufficient documentation

## 2013-08-06 DIAGNOSIS — Z79899 Other long term (current) drug therapy: Secondary | ICD-10-CM | POA: Insufficient documentation

## 2013-08-06 MED ORDER — OXYCODONE-ACETAMINOPHEN 5-325 MG PO TABS
2.0000 | ORAL_TABLET | Freq: Once | ORAL | Status: AC
  Administered 2013-08-06: 2 via ORAL
  Filled 2013-08-06: qty 2

## 2013-08-06 NOTE — ED Notes (Signed)
Was running after my dog and I fell, injured my right wrist per pt.

## 2013-08-06 NOTE — ED Provider Notes (Signed)
CSN: [id number]     Arrival date & time 08/06/13  2124 History   First MD Initiated Contact with Patient 08/06/13 2320     Chief Complaint  Patient presents with  . Wrist Injury  . Fall   (Consider location/radiation/quality/duration/timing/severity/associated sxs/prior Treatment) Patient is a 57 y.o. male presenting with wrist injury. The history is provided by the patient.  Wrist Injury Location:  Wrist Time since incident:  6 hours Injury: yes   Mechanism of injury: fall   Fall:    Fall occurred:  Standing   Impact surface:  Designer, fashion/clothing of impact:  Hands   Entrapped after fall: no   Wrist location:  R wrist Pain details:    Quality:  Throbbing   Radiates to:  Does not radiate   Severity:  Severe   Onset quality:  Sudden   Timing:  Constant   Progression:  Worsening Chronicity:  New Handedness:  Right-handed Dislocation: no   Foreign body present:  No foreign bodies Prior injury to area:  No Relieved by:  Nothing Worsened by:  Movement Ineffective treatments:  Ice Associated symptoms: decreased range of motion and swelling   Associated symptoms: no back pain, no fever, no neck pain, no numbness and no stiffness    Patient c/o sudden onset of right wrist pain that began when he fell on an outstretched hand while walking his dog.  Pain to the wrist is worse with movement.  He denies numbness or weakness of the hand, elbow or shoulder pain.     Past Medical History  Diagnosis Date  . Hepatitis C reactive     LIVER BX 2008-CHRONIC ACTIVE HEPATITIS  . Back problem   . GERD (gastroesophageal reflux disease)   . IBS (irritable bowel syndrome)   . HTN (hypertension)   . Hypercholesterolemia   . Hx of adenomatous colonic polyps 2008    due for surveillance 2018  . Knee pain   . Substance abuse     narcotic addiction  . Thyroiditis     History of  . Normal echocardiogram 05/25/2011    EF 55%, mild TR  . Normal cardiac stress test 05/25/2011    [facility]  . Depression   . Hepatitis C 1979   Past Surgical History  Procedure Laterality Date  . Appendectomy    . Tonsillectomy    . Carpal tunnel release    . Right ring finger    . Spinal stenosis, had screws put in neck  04/11/2009    DR KRITZER  . Upper gastrointestinal endoscopy  2008 [facility] ABD PAIN WEIGHT LOSS d100 v8 phen 12.5    NL  . Colonoscopy  2008 [facility] ARS D100 V8 PHEN 12.5    2 SIMPLE ADENOMAS (< 1 CM)  . Knee arthroscopy    . Ulnar nerve transposition    . Esophagogastroduodenoscopy  04/26/2012    [facility]: Non-erosive gastritis (inflammation) was found in the gastric antrum but no H.pylori; multiple biopsies (duodenal bx negative for Celiac)/ The mucosa of the esophagus appeared normal  . Eus  09/08/2012    Procedure: UPPER ENDOSCOPIC ULTRASOUND (EUS) LINEAR;  Surgeon: Rachael Fee, MD;  Location: [facility];  Service: Endoscopy;  Laterality: N/A;  . Neurostimulator implant     Family History  Problem Relation Age of Onset  . Heart defect      FAMILY HX  . Cancer Mother     BLADDER  . Cancer Sister  METS  . Emotional abuse Sister   . Stroke Sister   . Emotional abuse Sister   . Heart failure Father   . Stroke Father   . Alcohol abuse Father   . Colon cancer Neg Hx   . Colon polyps Neg Hx   . Drug abuse Neg Hx   . Schizophrenia Neg Hx   . Seizures Neg Hx   . Sexual abuse Neg Hx   . Physical abuse Neg Hx   . Dementia Paternal Aunt   . Alcohol abuse Paternal Uncle   . Dementia Paternal Uncle   . Dementia Paternal Grandmother   . ADD / ADHD Cousin   . Bipolar disorder Cousin   . Anxiety disorder Cousin   . Depression Cousin     Committed suicide  . Alcohol abuse Cousin   . OCD Cousin   . Paranoid behavior Cousin    History  Substance Use Topics  . Smoking status: Former Smoker -- 1.00 packs/day    Types: Cigarettes    Quit date: 08/13/1986  . Smokeless tobacco: Never Used     Comment: quit 20 + yrs ago  . Alcohol Use: No    Review  of Systems  Constitutional: Negative for fever and chills.  Genitourinary: Negative for dysuria and difficulty urinating.  Musculoskeletal: Positive for arthralgias and joint swelling. Negative for back pain, neck pain, neck stiffness and stiffness.  Skin: Negative for color change and wound.  Neurological: Negative for dizziness, weakness and numbness.  All other systems reviewed and are negative.    Allergies  Levofloxacin; Amitiza; and Sulfa antibiotics  Home Medications   Current Outpatient Rx  Name  Route  Sig  Dispense  Refill  . amLODipine (NORVASC) 5 MG tablet   Oral   Take 1 tablet (5 mg total) by mouth daily.   30 tablet   4   . aspirin-acetaminophen-caffeine (EXCEDRIN MIGRAINE) 250-250-65 MG per tablet   Oral   Take 2 tablets by mouth daily as needed for pain.         . clonazePAM (KLONOPIN) 0.5 MG tablet   Oral   Take 1 tablet (0.5 mg total) by mouth 3 (three) times daily as needed for anxiety.   90 tablet   2   . cloNIDine (CATAPRES) 0.1 MG tablet   Oral   Take 1 tablet (0.1 mg total) by mouth 2 (two) times daily.   60 tablet   4   . ergocalciferol (VITAMIN D2) 50000 UNITS capsule   Oral   Take 1 capsule (50,000 Units total) by mouth once a week. One capsule once weekly ON THURSDAYS   4 capsule   0   . FLUoxetine (PROZAC) 20 MG capsule   Oral   Take 1 capsule (20 mg total) by mouth daily before breakfast.   30 capsule   2   . gabapentin (NEURONTIN) 400 MG capsule   Oral   Take 1 capsule (400 mg total) by mouth 3 (three) times daily.   90 capsule   2   . HYDROcodone-acetaminophen (NORCO) 7.5-325 MG per tablet   Oral   Take 1 tablet by mouth 3 (three) times daily as needed. Pain         . ibuprofen (ADVIL,MOTRIN) 200 MG tablet   Oral   Take 400-600 mg by mouth every 6 (six) hours as needed. For pain         . methocarbamol (ROBAXIN) 750 MG tablet      TAKE (1)  TABLET BY MOUTH (3) TIMES DAILY.   90 tablet   2   . omeprazole  (PRILOSEC) 20 MG capsule   Oral   Take 20 mg by mouth daily.         . polyethylene glycol (MIRALAX / GLYCOLAX) packet   Oral   Take 17 g by mouth daily.         . Probiotic Product (ALIGN) 4 MG CAPS   Oral   Take 1 capsule by mouth daily.         . QUEtiapine (SEROQUEL) 50 MG tablet   Oral   Take 1-2 tablets (50-100 mg total) by mouth 2 (two) times daily. Take by mouth 1 tablet 3 times daily and 2 at bedtime   150 tablet   2   . senna (SENOKOT) 8.6 MG TABS tablet   Oral   Take 1 tablet by mouth daily as needed.          BP 127/76  Pulse 89  Temp(Src) 98.4 F (36.9 C) (Oral)  Resp 20  Ht 5\' 10"  (1.778 m)  Wt 165 lb (74.844 kg)  BMI 23.68 kg/m2  SpO2 98% Physical Exam  Nursing note and vitals reviewed. Constitutional: He is oriented to person, place, and time. He appears well-developed and well-nourished. No distress.  HENT:  Head: Normocephalic and atraumatic.  Neck: Normal range of motion. Neck supple.  Cardiovascular: Normal rate, regular rhythm, normal heart sounds and intact distal pulses.   No murmur heard. Pulmonary/Chest: Effort normal and breath sounds normal. No respiratory distress. He exhibits no tenderness.  Musculoskeletal: He exhibits edema and tenderness.       Right hand: He exhibits decreased range of motion, tenderness, bony tenderness and deformity. He exhibits normal two-point discrimination, normal capillary refill, no laceration and no swelling. Normal sensation noted. Decreased sensation is not present in the ulnar distribution and is not present in the medial distribution. Normal strength noted.  Bony deformity of the right wrist.  ttp distally.  Radial pulse is brisk, distal sensation intact.  CR< 2 sec. No proximal tenderness.  Compartments soft.  Neurological: He is alert and oriented to person, place, and time. He exhibits normal muscle tone. Coordination normal.  Skin: Skin is warm and dry.    ED Course  Procedures (including  critical care time) Labs Review Labs Reviewed - No data to display Imaging Review Dg Wrist Complete Right  08/06/2013   CLINICAL DATA:  Pain post trauma  EXAM: RIGHT WRIST - COMPLETE 3+ VIEW  COMPARISON:  None.  FINDINGS: Frontal, oblique, lateral, and ulnar deviation scaphoid images were obtained. There is a comminuted fracture of the distal radial metaphysis with a fracture fragment extending into the radiocarpal joint. There is impaction at the fracture site with some volar displacement distally. There is a comminuted fracture with avulsion of the ulnar styloid. No other fractures. No dislocation.  IMPRESSION: Comminuted, impacted fracture of the distal radius with a fracture fragment extending to the radiocarpal joint level. Comminuted avulsion type injury to the ulnar styloid. No dislocation.   Electronically Signed   By: Bretta Bang M.D.   On: 08/06/2013 21:48    EKG Interpretation   None       MDM   Medications  oxyCODONE-acetaminophen (PERCOCET/ROXICET) 5-325 MG per tablet 2 tablet (2 tablets Oral Given 08/06/13 2345)     X-ray findings discussed with the patient.  He has an appt with one of the physicians at [facility] tomorrow for his  knee.  Agrees to sugar tong splint, elevate, ice and orthopedic f/u tomorrow.  I will prescribe #15 percocet for pain.  Patient appears stable for discharge.    Shaunie Boehm L. Ruthel Martine, PA-C 08/07/13 0013

## 2013-08-07 MED ORDER — OXYCODONE-ACETAMINOPHEN 5-325 MG PO TABS: 1.0000 | ORAL_TABLET | ORAL | Status: DC | PRN

## 2013-08-07 NOTE — ED Provider Notes (Signed)
Medical screening examination/treatment/procedure(s) were performed by non-physician practitioner and as supervising physician I was immediately available for consultation/collaboration.    Vida Roller, MD 08/07/13 [phone]

## 2013-08-10 HISTORY — PX: WRIST SURGERY: SHX841

## 2013-08-13 ENCOUNTER — Encounter (HOSPITAL_COMMUNITY): Payer: Self-pay | Admitting: Emergency Medicine

## 2013-08-13 ENCOUNTER — Emergency Department (HOSPITAL_COMMUNITY)
Admission: EM | Admit: 2013-08-13 | Discharge: 2013-08-13 | Disposition: A | Discharge status: Home / Self Care | Payer: PRIVATE HEALTH INSURANCE | Attending: Emergency Medicine | Admitting: Emergency Medicine

## 2013-08-13 DIAGNOSIS — Z8719 Personal history of other diseases of the digestive system: Secondary | ICD-10-CM | POA: Insufficient documentation

## 2013-08-13 DIAGNOSIS — Z79899 Other long term (current) drug therapy: Secondary | ICD-10-CM | POA: Insufficient documentation

## 2013-08-13 DIAGNOSIS — Z860100 Personal history of colon polyps, unspecified: Secondary | ICD-10-CM | POA: Insufficient documentation

## 2013-08-13 DIAGNOSIS — Z8639 Personal history of other endocrine, nutritional and metabolic disease: Secondary | ICD-10-CM | POA: Insufficient documentation

## 2013-08-13 DIAGNOSIS — F329 Major depressive disorder, single episode, unspecified: Secondary | ICD-10-CM | POA: Insufficient documentation

## 2013-08-13 DIAGNOSIS — M79641 Pain in right hand: Secondary | ICD-10-CM

## 2013-08-13 DIAGNOSIS — IMO0001 Reserved for inherently not codable concepts without codable children: Secondary | ICD-10-CM | POA: Insufficient documentation

## 2013-08-13 DIAGNOSIS — I10 Essential (primary) hypertension: Secondary | ICD-10-CM | POA: Insufficient documentation

## 2013-08-13 DIAGNOSIS — F3289 Other specified depressive episodes: Secondary | ICD-10-CM | POA: Insufficient documentation

## 2013-08-13 DIAGNOSIS — Z862 Personal history of diseases of the blood and blood-forming organs and certain disorders involving the immune mechanism: Secondary | ICD-10-CM | POA: Insufficient documentation

## 2013-08-13 DIAGNOSIS — Z8619 Personal history of other infectious and parasitic diseases: Secondary | ICD-10-CM | POA: Insufficient documentation

## 2013-08-13 DIAGNOSIS — S62101D Fracture of unspecified carpal bone, right wrist, subsequent encounter for fracture with routine healing: Secondary | ICD-10-CM

## 2013-08-13 DIAGNOSIS — M549 Dorsalgia, unspecified: Secondary | ICD-10-CM | POA: Insufficient documentation

## 2013-08-13 DIAGNOSIS — M79609 Pain in unspecified limb: Secondary | ICD-10-CM | POA: Insufficient documentation

## 2013-08-13 DIAGNOSIS — Z87891 Personal history of nicotine dependence: Secondary | ICD-10-CM | POA: Insufficient documentation

## 2013-08-13 MED ORDER — IBUPROFEN 800 MG PO TABS
800.0000 mg | ORAL_TABLET | Freq: Once | ORAL | Status: AC
  Administered 2013-08-13: 800 mg via ORAL
  Filled 2013-08-13: qty 1

## 2013-08-13 MED ORDER — OXYCODONE-ACETAMINOPHEN 5-325 MG PO TABS
1.0000 | ORAL_TABLET | Freq: Once | ORAL | Status: AC
  Administered 2013-08-13: 1 via ORAL
  Filled 2013-08-13: qty 1

## 2013-08-13 NOTE — ED Notes (Signed)
Pt states he broke his right wrist and was treated hete Sunday. States he is out of pain meds. Is to have surgery Monday

## 2013-08-13 NOTE — ED Provider Notes (Signed)
CSN: [id number]     Arrival date & time 08/13/13  1438 History   First MD Initiated Contact with Patient 08/13/13 1606     Chief Complaint  Patient presents with  . Hand Pain   (Consider location/radiation/quality/duration/timing/severity/associated sxs/prior Treatment) HPI Comments: Pt sustained fracture of the right radius and ulnar on 12/28. He wast placed in a splint, treated with pain meds, and was seen by Dr Debroah Loop team. He is now out of pain meds and presents to ED requesting assistance with pain management. Pt is scheduled for surgery next week.  Patient is a 58 y.o. male presenting with hand pain. The history is provided by the patient.  Hand Pain This is a recurrent problem. The current episode started in the past 7 days. Associated symptoms include arthralgias. Pertinent negatives include no abdominal pain, chest pain, coughing or neck pain. Exacerbated by: palpation and certain movements. Treatments tried: ice and percocets. The treatment provided mild relief.    Past Medical History  Diagnosis Date  . Hepatitis C reactive     LIVER BX 2008-CHRONIC ACTIVE HEPATITIS  . Back problem   . GERD (gastroesophageal reflux disease)   . IBS (irritable bowel syndrome)   . HTN (hypertension)   . Hypercholesterolemia   . Hx of adenomatous colonic polyps 2008    due for surveillance 2018  . Knee pain   . Substance abuse     narcotic addiction  . Thyroiditis     History of  . Normal echocardiogram 05/25/2011    EF 55%, mild TR  . Normal cardiac stress test 05/25/2011    [facility] [place]  . Depression   . Hepatitis C 1979   Past Surgical History  Procedure Laterality Date  . Appendectomy    . Tonsillectomy    . Carpal tunnel release    . Right ring finger    . Spinal stenosis, had screws put in neck  04/11/2009    DR KRITZER  . Upper gastrointestinal endoscopy  2008 [facility] ABD PAIN WEIGHT LOSS d100 v8 phen 12.5    NL  . Colonoscopy  2008 [facility] ARS D100 V8 PHEN 12.5     2 SIMPLE ADENOMAS (< 1 CM)  . Knee arthroscopy    . Ulnar nerve transposition    . Esophagogastroduodenoscopy  04/26/2012    [facility]: Non-erosive gastritis (inflammation) was found in the gastric antrum but no H.pylori; multiple biopsies (duodenal bx negative for Celiac)/ The mucosa of the esophagus appeared normal  . Eus  09/08/2012    Procedure: UPPER ENDOSCOPIC ULTRASOUND (EUS) LINEAR;  Surgeon: Milus Banister, MD;  Location: [facility];  Service: Endoscopy;  Laterality: N/A;  . Neurostimulator implant     Family History  Problem Relation Age of Onset  . Heart defect      FAMILY HX  . Cancer Mother     BLADDER  . Cancer Sister     METS  . Emotional abuse Sister   . Stroke Sister   . Emotional abuse Sister   . Heart failure Father   . Stroke Father   . Alcohol abuse Father   . Colon cancer Neg Hx   . Colon polyps Neg Hx   . Drug abuse Neg Hx   . Schizophrenia Neg Hx   . Seizures Neg Hx   . Sexual abuse Neg Hx   . Physical abuse Neg Hx   . Dementia Paternal Aunt   . Alcohol abuse Paternal Uncle   . Dementia Paternal  Uncle   . Dementia Paternal Grandmother   . ADD / ADHD Cousin   . Bipolar disorder Cousin   . Anxiety disorder Cousin   . Depression Cousin     Committed suicide  . Alcohol abuse Cousin   . OCD Cousin   . Paranoid behavior Cousin    History  Substance Use Topics  . Smoking status: Former Smoker -- 1.00 packs/day    Types: Cigarettes    Quit date: 08/13/1986  . Smokeless tobacco: Never Used     Comment: quit 20 + yrs ago  . Alcohol Use: No    Review of Systems  Constitutional: Negative for activity change.       All ROS Neg except as noted in HPI  HENT: Negative for nosebleeds.   Eyes: Negative for photophobia and discharge.  Respiratory: Negative for cough, shortness of breath and wheezing.   Cardiovascular: Negative for chest pain and palpitations.  Gastrointestinal: Negative for abdominal pain and blood in stool.  Genitourinary: Negative  for dysuria, frequency and hematuria.  Musculoskeletal: Positive for arthralgias and back pain. Negative for neck pain.  Skin: Negative.   Neurological: Negative for dizziness, seizures and speech difficulty.  Psychiatric/Behavioral: Negative for hallucinations and confusion.       Depression    Allergies  Levofloxacin; Amitiza; and Sulfa antibiotics  Home Medications   Current Outpatient Rx  Name  Route  Sig  Dispense  Refill  . amLODipine (NORVASC) 5 MG tablet   Oral   Take 1 tablet (5 mg total) by mouth daily.   30 tablet   4   . aspirin-acetaminophen-caffeine (EXCEDRIN MIGRAINE) 427-062-37 MG per tablet   Oral   Take 2 tablets by mouth daily as needed for pain.         . clonazePAM (KLONOPIN) 0.5 MG tablet   Oral   Take 1 tablet (0.5 mg total) by mouth 3 (three) times daily as needed for anxiety.   90 tablet   2   . cloNIDine (CATAPRES) 0.1 MG tablet   Oral   Take 1 tablet (0.1 mg total) by mouth 2 (two) times daily.   60 tablet   4   . FLUoxetine (PROZAC) 20 MG capsule   Oral   Take 1 capsule (20 mg total) by mouth daily before breakfast.   30 capsule   2   . gabapentin (NEURONTIN) 400 MG capsule   Oral   Take 1 capsule (400 mg total) by mouth 3 (three) times daily.   90 capsule   2   . HYDROcodone-acetaminophen (NORCO) 7.5-325 MG per tablet   Oral   Take 1 tablet by mouth 3 (three) times daily as needed. Pain         . ibuprofen (ADVIL,MOTRIN) 200 MG tablet   Oral   Take 400-600 mg by mouth every 6 (six) hours as needed. For pain         . methocarbamol (ROBAXIN) 750 MG tablet   Oral   Take 750 mg by mouth 3 (three) times daily.         . Probiotic Product (ALIGN) 4 MG CAPS   Oral   Take 1 capsule by mouth daily.         . QUEtiapine (SEROQUEL) 50 MG tablet   Oral   Take 1-2 tablets (50-100 mg total) by mouth 2 (two) times daily. Take by mouth 1 tablet 3 times daily and 2 at bedtime   150 tablet   2  BP 109/73  Pulse 94   Temp(Src) 98.2 F (36.8 C) (Oral)  Resp 20  Ht 5\' 10"  (1.778 m)  Wt 166 lb (75.297 kg)  BMI 23.82 kg/m2  SpO2 98% Physical Exam  Nursing note and vitals reviewed. Constitutional: He is oriented to person, place, and time. He appears well-developed and well-nourished.  Non-toxic appearance.  HENT:  Head: Normocephalic.  Right Ear: Tympanic membrane and external ear normal.  Left Ear: Tympanic membrane and external ear normal.  Eyes: EOM and lids are normal. Pupils are equal, round, and reactive to light.  Neck: Normal range of motion. Neck supple. Carotid bruit is not present.  Cardiovascular: Normal rate, regular rhythm, normal heart sounds, intact distal pulses and normal pulses.   Pulmonary/Chest: Breath sounds normal. No respiratory distress.  Abdominal: Soft. Bowel sounds are normal. There is no tenderness. There is no guarding.  Musculoskeletal:  Wrist forearm splint in place. Radial and brachial pulses 2+. Cap refill less than 2 sec. No atrophy of the thenar areas. No temp changes of the hand or right arm. No evidence for septic joints.  Lymphadenopathy:       Head (right side): No submandibular adenopathy present.       Head (left side): No submandibular adenopathy present.    He has no cervical adenopathy.  Neurological: He is alert and oriented to person, place, and time. He has normal strength. No cranial nerve deficit or sensory deficit.  Skin: Skin is warm and dry.  Psychiatric: He has a normal mood and affect. His speech is normal.    ED Course  Procedures (including critical care time) Labs Review Labs Reviewed - No data to display Imaging Review No results found.  EKG Interpretation   None       MDM  No diagnosis found. **I have reviewed nursing notes, vital signs, and all appropriate lab and imaging results for this patient.* Vital signs stable. Pt was seen by Dr Debroah Loop staff and surgery for wrist fracture  has been scheduled. Pt presents to ED for  additional pain management. Pt given ibuprofen and percocet in the ED. He is to see Dr Moshe Cipro or Dr Debroah Loop team for pain management. Discussed with patient that ED is not set up for pain management.   Lenox Ahr, PA-C 08/13/13 1654

## 2013-08-13 NOTE — Discharge Instructions (Signed)
Your vital signs are within normal limits today. Please see Dr Darnelle Bos or the team from Dr Debroah Loop office for pain management.

## 2013-08-13 NOTE — ED Provider Notes (Signed)
Medical screening examination/treatment/procedure(s) were performed by non-physician practitioner and as supervising physician I was immediately available for consultation/collaboration.  EKG Interpretation   None       Rolland Porter, MD, Abram Sander   Janice Norrie, MD 08/13/13 [phone]

## 2013-08-17 ENCOUNTER — Ambulatory Visit (HOSPITAL_COMMUNITY): Payer: Self-pay | Admitting: Psychiatry

## 2013-08-23 ENCOUNTER — Ambulatory Visit: Payer: PRIVATE HEALTH INSURANCE | Admitting: Family Medicine

## 2013-08-25 ENCOUNTER — Other Ambulatory Visit: Payer: Self-pay | Admitting: Family Medicine

## 2013-09-05 ENCOUNTER — Ambulatory Visit (INDEPENDENT_AMBULATORY_CARE_PROVIDER_SITE_OTHER): Payer: PRIVATE HEALTH INSURANCE | Admitting: Psychiatry

## 2013-09-05 DIAGNOSIS — F331 Major depressive disorder, recurrent, moderate: Secondary | ICD-10-CM

## 2013-09-05 NOTE — Patient Instructions (Signed)
Discussed orally 

## 2013-09-05 NOTE — Progress Notes (Signed)
.   Patient:  Marc [name]   DOB: 1955-09-15  MR Number: [id number]  Location: [facility]:  [address] [place],  [place], [address]  Start: Tuesday 09/05/2013 2:00 PM End: Tuesday 09/05/2013 2:50 PM  Provider/Observer:     Maurice Small, MSW, LCSW   Chief Complaint:      Chief Complaint  Patient presents with  . Depression    Reason For Service:     The patient initially was referred by Dr. Adele Schilder to improve coping and social skills as patient was experiencing sadness, depression, grief, and anger issues. The patient has a history of recurrent periods of depression along with a history of chemical dependency. He has had psychiatric hospitalization due to suicidal thoughts and has participated in inpatient treatment as well as outpatient treatment for chemical dependency. He completed the chemical dependency IOP program in [place] in December 2012. Patient is seen today for a follow-up appointment.  Interventions Strategy:  Supportive therapy, cognitive behavioral therapy  Participation Level:   Active  Participation Quality:  Appropriate and Sharing      Behavioral Observation:  Casual , appropriate  Current Psychosocial Factors: having  a relationship with a friend with whom he's had difficulties setting boundaries in the past, patient is in the disability application process  Content of Session:   Reviewing symptoms, processing feelings, identifying ways to pursue interests and expand support system  Current Status:   The patient reports less depressed mood and less anxiety. He denies suicidal ideations.  Patient Progress:   The patient reports falling and breaking his right hand in December, 2014. He reports chronic pain and sleep difficulty.  Being unable to use his hand has caused  Increased depressed mood at times but patient states he is maintaining.He expresses frustration regarding friend who has taken advantage of him but states difficulty saying no as he  had limited support system He expresses desire to become involved in fellowship with other believers and states wanting a companion. Therapist works with patient to [employer] pursue interests and expand support system. Patient reports he continues to attend AA and NA meetings weekly and says he has maintained abstinence from alcohol and drug use other than drugs that have been prescribed.    Target Goals:    Decrease anxiety, increasing self acceptance  Last Reviewed:   08/14/2011  Goals Addressed Today:    Decrease anxiety  Impression/Diagnosis:   The patient has a long-standing history of recurrent periods of depression and continues to experience sadness, anger, depressed mood, and poor concentration. Diagnoses: Maj. depressive disorder, recurrent  Diagnosis:  Axis I:  Major depressive disorder, recurrent, moderate          Axis II: Deferred

## 2013-09-21 ENCOUNTER — Other Ambulatory Visit: Payer: Self-pay | Admitting: Family Medicine

## 2013-09-29 ENCOUNTER — Other Ambulatory Visit: Payer: Self-pay | Admitting: Family Medicine

## 2013-10-04 ENCOUNTER — Encounter: Payer: Self-pay | Admitting: Family Medicine

## 2013-10-04 ENCOUNTER — Ambulatory Visit (INDEPENDENT_AMBULATORY_CARE_PROVIDER_SITE_OTHER): Payer: PRIVATE HEALTH INSURANCE | Admitting: Family Medicine

## 2013-10-04 VITALS — BP 120/72 | HR 86 | Resp 18 | Ht 70.0 in | Wt 161.1 lb

## 2013-10-04 DIAGNOSIS — Z1211 Encounter for screening for malignant neoplasm of colon: Secondary | ICD-10-CM

## 2013-10-04 DIAGNOSIS — Z2911 Encounter for prophylactic immunotherapy for respiratory syncytial virus (RSV): Secondary | ICD-10-CM

## 2013-10-04 DIAGNOSIS — F3289 Other specified depressive episodes: Secondary | ICD-10-CM

## 2013-10-04 DIAGNOSIS — I10 Essential (primary) hypertension: Secondary | ICD-10-CM

## 2013-10-04 DIAGNOSIS — R5382 Chronic fatigue, unspecified: Secondary | ICD-10-CM

## 2013-10-04 DIAGNOSIS — F329 Major depressive disorder, single episode, unspecified: Secondary | ICD-10-CM

## 2013-10-04 DIAGNOSIS — Z13220 Encounter for screening for lipoid disorders: Secondary | ICD-10-CM

## 2013-10-04 DIAGNOSIS — R5383 Other fatigue: Secondary | ICD-10-CM

## 2013-10-04 DIAGNOSIS — M65332 Trigger finger, left middle finger: Secondary | ICD-10-CM

## 2013-10-04 DIAGNOSIS — Z23 Encounter for immunization: Secondary | ICD-10-CM

## 2013-10-04 DIAGNOSIS — M6530 Trigger finger, unspecified finger: Secondary | ICD-10-CM

## 2013-10-04 DIAGNOSIS — R5381 Other malaise: Secondary | ICD-10-CM

## 2013-10-04 DIAGNOSIS — M549 Dorsalgia, unspecified: Secondary | ICD-10-CM

## 2013-10-04 NOTE — Patient Instructions (Signed)
F/U in 4 months, call if you need me before  It is important that you exercise regularly at least 30 minutes 5 times a week. If you develop chest pain, have severe difficulty breathing, or feel very tired, stop exercising immediately and seek medical attention    A healthy diet is rich in fruit, vegetables and whole grains. Poultry fish, nuts and beans are a healthy choice for protein rather then red meat. A low sodium diet and drinking 64 ounces of water daily is generally recommended. Oils and sweet should be limited. Carbohydrates especially for those who are diabetic or overweight, should be limited to 45 to 60 gram per meal. It is important to eat on a regular schedule, at least 3 times daily. Snacks should be primarily fruits, vegetables or nuts.  Adequate rest, generally 6 to 8 hours per night is important for good health.Good sleep hygiene involves setting a regular bedtime, and turning off all sound and light in your sleep environment.Limiting caffeine intake will also help with the ability to rest well  Lab work needs to be done 3 to 5 days before your follow up visit please.Fasting lipid, cmp in 4.5 month  Blood pressure is good , no med change  Zostavax today  You are referred for colonscopy mid March or after  All medications need to be brought to every visit

## 2013-10-05 DIAGNOSIS — R5382 Chronic fatigue, unspecified: Secondary | ICD-10-CM | POA: Insufficient documentation

## 2013-10-05 NOTE — Assessment & Plan Note (Signed)
Still symptomatic, will re address with ortho at visit next week

## 2013-10-05 NOTE — Assessment & Plan Note (Signed)
Controlled, no change in medication DASH diet and commitment to daily physical activity for a minimum of 30 minutes discussed and encouraged, as a part of hypertension management. The importance of attaining a healthy weight is also discussed.  

## 2013-10-05 NOTE — Progress Notes (Signed)
   Subjective:    Patient ID: Marc [name], male    DOB: 14-Sep-1955, 58 y.o.   MRN: [id number]  HPI The PT is here for follow up and re-evaluation of chronic medical conditions, medication management and review of any available recent lab and radiology data.  Preventive health is updated, specifically  Cancer screening and Immunization.   Questions or concerns regarding consultations or procedures which the PT has had in the interim are  Addressed.Since last visit had right forearm surgery following a fracture.  C/o uncontrolled pain affecting trigger finger, and back, not using his nerve stimulator as he should The PT denies any adverse reactions to current medications since the last visit. Currently being prescribed pain meds by ortho but has upcoming appt with his pain specialist. He attributes his chronic fatigue to poor sleep due to pain, however , I believe it may be worth discussing this with his psych to see if a stimulant may be beneficial.  There are no new concerns.  There are no specific complaints       Review of Systems See HPI Denies recent fever or chills. Denies sinus pressure, nasal congestion, ear pain or sore throat. Denies chest congestion, productive cough or wheezing. Denies chest pains, palpitations and leg swelling Denies abdominal pain, nausea, vomiting,diarrhea or constipation.   Denies dysuria, frequency, hesitancy or incontinence.  Denies headaches, seizures, numbness, or tingling. Denies uncontrolled  depression, anxiety or insomnia. Denies skin break down or rash.        Objective:   Physical Exam  BP 120/72  Pulse 86  Resp 18  Ht 5\' 10"  (1.778 m)  Wt 161 lb 1.9 oz (73.084 kg)  BMI 23.12 kg/m2  SpO2 98% Patient alert and oriented and in no cardiopulmonary distress.Flat affect   HEENT: No facial asymmetry, EOMI, no sinus tenderness,  oropharynx pink and moist.  Neck supple no adenopathy.  Chest: Clear to auscultation bilaterally.  CVS:  S1, S2 no murmurs, no S3.  ABD: Soft non tender. Bowel sounds normal.  Ext: No edema  MS: Adequate ROM spine, shoulders, hips and knees.  Skin: Intact, no ulcerations or rash noted.  Psych: Good eye contact, depressed appearing, not anxious  CNS: CN 2-12 intact, power, tone and sensation normal throughout.       Assessment & Plan:  UNSPECIFIED ESSENTIAL HYPERTENSION Controlled, no change in medication DASH diet and commitment to daily physical activity for a minimum of 30 minutes discussed and encouraged, as a part of hypertension management. The importance of attaining a healthy weight is also discussed.   Trigger middle finger of left hand Still symptomatic, will re address with ortho at visit next week  DEPRESSION Followed by psych, doing better , but has low energy and fatigue , to discuss use of a stimulant with his psych next visit  Chronic fatigue Likely due to a combination of his  Medical illness of depression as well as the medications he is maintained on, may benefit from the addition of a stimulant, will leave that to the discretion of psych No h/o excessive snoring to suggest sleep apnea  Back pain with radiation Neuro stimulator is implanted, and also followed by pain specialist, has h/o addiction to narcotic medication which is known by his pain specialist

## 2013-10-05 NOTE — Assessment & Plan Note (Signed)
Followed by psych, doing better , but has low energy and fatigue , to discuss use of a stimulant with his psych next visit

## 2013-10-05 NOTE — Assessment & Plan Note (Signed)
Likely due to a combination of his  Medical illness of depression as well as the medications he is maintained on, may benefit from the addition of a stimulant, will leave that to the discretion of psych No h/o excessive snoring to suggest sleep apnea

## 2013-10-05 NOTE — Assessment & Plan Note (Signed)
Neuro stimulator is implanted, and also followed by pain specialist, has h/o addiction to narcotic medication which is known by his pain specialist

## 2013-10-12 ENCOUNTER — Ambulatory Visit (INDEPENDENT_AMBULATORY_CARE_PROVIDER_SITE_OTHER): Payer: PRIVATE HEALTH INSURANCE | Admitting: Psychiatry

## 2013-10-12 DIAGNOSIS — F331 Major depressive disorder, recurrent, moderate: Secondary | ICD-10-CM

## 2013-10-13 NOTE — Patient Instructions (Signed)
Discussed orally 

## 2013-10-13 NOTE — Progress Notes (Signed)
.   Patient:  Marc [name]   DOB: 07-Jun-1956  MR Number: [id number]  Location: [facility]:  [address] [place],  [place], [address]  Start: Thursday 10/12/2013 2:10 PM End: Thursday 10/12/2013 2:55 PM  Provider/Observer:     Maurice Small, MSW, LCSW   Chief Complaint:      Chief Complaint  Patient presents with  . Depression    Reason For Service:     The patient initially was referred by Dr. Adele Schilder to improve coping and social skills as patient was experiencing sadness, depression, grief, and anger issues. The patient has a history of recurrent periods of depression along with a history of chemical dependency. He has had psychiatric hospitalization due to suicidal thoughts and has participated in inpatient treatment as well as outpatient treatment for chemical dependency. He completed the chemical dependency IOP program in [place] in December 2012. Patient is seen today for a follow-up appointment.  Interventions Strategy:  Supportive therapy, cognitive behavioral therapy  Participation Level:   Active  Participation Quality:  Appropriate and Sharing      Behavioral Observation:  Casual , appropriate  Current Psychosocial Factors: having  a relationship with a friend with whom he's had difficulties setting boundaries in the past, limited support system  Content of Session:   Reviewing symptoms, processing feelings, reinforcing patient's efforts to set and maintain boundaries, identify ways to expand social network and increase social interaction  Current Status:   The patient reports continued depression but feeling less depressed than last session. He reports decreased anxiety.  Patient Progress:   The patient reports recently being awarded disability benefits which has resulted in decreased financial stress. However, patient has experienced increased  pain and knee pain resulting in increased sleep difficulty and decreased physical activity. He also expresses  frustration with the inclement weather. He has improved his ability to set and maintain boundaries in the relationship with his friend and cites several examples of saying no. Patient continues to feel very lonely and wants more social involvement. However, he has not followed through on some of the previous suggestions. Therapist works with patient to identify other ways to expand social network and increased social involvement including the connecting with the friend, going out to dinner with another friend, and possibly attending church. Patient agrees to make for contacts by next session   Target Goals:    Decrease anxiety, increasing self acceptance  Last Reviewed:   08/14/2011  Goals Addressed Today:    Decrease anxiety  Impression/Diagnosis:   The patient has a long-standing history of recurrent periods of depression and continues to experience sadness, anger, depressed mood, and poor concentration. Diagnoses: Maj. depressive disorder, recurrent  Diagnosis:  Axis I:  Major depressive disorder, recurrent, moderate          Axis II: Deferred

## 2013-10-16 ENCOUNTER — Ambulatory Visit (INDEPENDENT_AMBULATORY_CARE_PROVIDER_SITE_OTHER): Payer: PRIVATE HEALTH INSURANCE | Admitting: Psychiatry

## 2013-10-16 ENCOUNTER — Encounter (HOSPITAL_COMMUNITY): Payer: Self-pay | Admitting: Psychiatry

## 2013-10-16 VITALS — BP 130/70 | Ht 70.0 in | Wt 161.0 lb

## 2013-10-16 DIAGNOSIS — F329 Major depressive disorder, single episode, unspecified: Secondary | ICD-10-CM

## 2013-10-16 DIAGNOSIS — G4700 Insomnia, unspecified: Secondary | ICD-10-CM

## 2013-10-16 DIAGNOSIS — F331 Major depressive disorder, recurrent, moderate: Secondary | ICD-10-CM

## 2013-10-16 DIAGNOSIS — F3289 Other specified depressive episodes: Secondary | ICD-10-CM

## 2013-10-16 MED ORDER — FLUOXETINE HCL 20 MG PO CAPS: 20.0000 mg | ORAL_CAPSULE | Freq: Every day | ORAL | Status: DC

## 2013-10-16 MED ORDER — QUETIAPINE FUMARATE 50 MG PO TABS: 50.0000 mg | ORAL_TABLET | Freq: Two times a day (BID) | ORAL | Status: DC

## 2013-10-16 MED ORDER — GABAPENTIN 400 MG PO CAPS: 400.0000 mg | ORAL_CAPSULE | Freq: Three times a day (TID) | ORAL | Status: DC

## 2013-10-16 MED ORDER — CLONAZEPAM 0.5 MG PO TABS: 0.5000 mg | ORAL_TABLET | Freq: Three times a day (TID) | ORAL | Status: DC | PRN

## 2013-10-16 NOTE — Progress Notes (Signed)
Patient ID: DEVANSH RIESE, male   DOB: 06-03-1956, 58 y.o.   MRN: [id number] Patient ID: BOBIE KISTLER, male   DOB: [birth year], 58 y.o.   MRN: [id number] Patient ID: ILIR MAHRT, male   DOB: 1955/10/24, 58 y.o.   MRN: [id number] [facility] [address] Progress Note ALLAN MINOTTI MRN: [id number] DOB: Oct 30, 1955 Age: 58 y.o.  Date: 10/16/2013 Start Time: 1:15 PM End Time: 1:35 PM  Chief Complaint: Chief Complaint  Patient presents with  . Anxiety  . Depression  . 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 He has one son age 18 years old and lives in [place]  The patient states he has a history depression that started around 2009. Prior to that year he been treated with ribavirin to treat hepatitis C which made him very depressed. He's also had a number of deaths in his family-both sisters have died as well as both parents in the last several years. His son is very close to him and he only talks to him periodically.  Currently the patient is struggling with medical problems including neck and back pain. He recently had a neurostimulator installed in his back which is helped to some degree. Lately he's been a bit more depressed because his aunt keeps calling and wanting to talk about all the dead relatives people he's been draining a lot about people who have died. He's not been seeing his counselor regularly and I think it would be helpful for him to get back into counseling. He does have friends and he walks his dog 2 miles a day. He admits to passive suicidal ideation but would never hurt himself.  The patient returns for followup after 3 months. On December 28 he fell in the woods and broke his right wrist. He's had to have surgery and now it is still hurting. He states that the orthopedic specialist may have to remove the plate  and start over and is very discouraged with it. He was on oxycodone for time but it made him very lethargic and now he is on Norco. He's still getting out and walking his dog and trying to go to meetings as much is possible. He states his mood has been lower but he knows this because he got injured and is in pain.  .  Current psychiatric medication Prozac 20 mg in AM Seroquel 50 mg 3 times a day and 2 at bedtime   Klonopin 0.5 mg 1-2 tablet  Vitamin D 50,000 IU weekly prescribed by PCP  Past psychiatric history Patient has history of depression for many years. He has taken in the past Wellbutrin Cymbalta and Celexa. He denies any history of suicidal attempt.  Alcohol and substance use history Patient has significant history of alcohol with multiple rehabilitation and detox treatment. He has history of DWI. He also had history of taking recreational drugs and he was in the [employer]. He has history of intravenous drug use that cause hepatitis C. Patient also had history of using pain medication.  Allergies: Allergies  Allergen Reactions  . Levofloxacin Nausea Only and Other (See Comments)    "Creepy" feeling, skin didn't feel right, sort of itchy.  Baker Pierini [Lubiprostone]     MADE HIM FEEL STIFF  . Sulfa Antibiotics    Medical History: Past Medical History  Diagnosis  Date  . Hepatitis C reactive     LIVER BX 2008-CHRONIC ACTIVE HEPATITIS  . Back problem   . GERD (gastroesophageal reflux disease)   . IBS (irritable bowel syndrome)   . HTN (hypertension)   . Hypercholesterolemia   . Hx of adenomatous colonic polyps 20-Nov-2006    due for surveillance 11/19/2016  . Knee pain   . Substance abuse     narcotic addiction  . Thyroiditis     History of  . Normal echocardiogram 05/25/2011    EF 55%, mild TR  . Normal cardiac stress test 05/25/2011    [facility] [place]  . Depression   . Hepatitis C 1979  Cervical fusion Neck surgery Neck pain Hepatitis C Hypertension Benign  tremors High blood pressure He sees Dr. Moshe Cipro and Dr. Paulita Cradle for pain management.  Surgical History: Past Surgical History  Procedure Laterality Date  . Appendectomy    . Tonsillectomy    . Carpal tunnel release    . Right ring finger    . Spinal stenosis, had screws put in neck  04/11/2009    DR KRITZER  . Upper gastrointestinal endoscopy  11/20/06 [facility] ABD PAIN WEIGHT LOSS d100 v8 phen 12.5    NL  . Colonoscopy  11/20/2006 [facility] ARS D100 V8 PHEN 12.5    2 SIMPLE ADENOMAS (< 1 CM)  . Knee arthroscopy    . Ulnar nerve transposition    . Esophagogastroduodenoscopy  04/26/2012    [facility]: Non-erosive gastritis (inflammation) was found in the gastric antrum but no H.pylori; multiple biopsies (duodenal bx negative for Celiac)/ The mucosa of the esophagus appeared normal  . Eus  09/08/2012    Procedure: UPPER ENDOSCOPIC ULTRASOUND (EUS) LINEAR;  Surgeon: Milus Banister, MD;  Location: [facility];  Service: Endoscopy;  Laterality: N/A;  . Neurostimulator implant    . Wrist surgery Right jan 2015    Dr. Edmonia Lynch   Family History: family history includes ADD / ADHD in his cousin; Alcohol abuse in his cousin, father, and paternal uncle; Anxiety disorder in his cousin; Bipolar disorder in his cousin; Cancer in his mother and sister; Dementia in his paternal aunt, paternal grandmother, and paternal uncle; Depression in his cousin; Emotional abuse in his sister and sister; Heart defect in an other family member; Heart failure in his father; OCD in his cousin; Paranoid behavior in his cousin; Stroke in his father and sister. There is no history of Colon cancer, Colon polyps, Drug abuse, Schizophrenia, Seizures, Sexual abuse, or Physical abuse. Reviewed again in the office and no changes were noted except the new pain issues  Psychosocial history Patient was born and raised in [place].  He married twice. 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 Patient has  multiple family member who has died in past few years. His father died in Nov 20, 2007 and mother died in Nov 19, 2008  and 2 of his sister also died.  Patient lives by himself.  Mental status examination Patient is casually dressed and groomed. He is tired but cooperative.  He maintained fair eye contact. He described his mood tired and his affect is constricted.  His speech is slow but clear. His thought process is slow but logical linear and goal-directed.  He has mild tremors which are chronic .  He denies any active or passive suicidal thinking or homicidal thinking. He denies any auditory or visual hallucination. He is alert and oriented x3.  His attention  and concentration is distracted. There were no paranoia or delusions present at this time. His insight judgment and impulse control is okay.  Diagnosis Axis I Maj. depressive disorder, polysubstance dependence by history Axis II deferred Axis III see medical history Axis IV mild to moderate Axis V 65-70  Plan/Discussion: I took his vitals.  I reviewed CC, tobacco/med/surg Hx, meds effects/ side effects, problem list, therapies and responses as well as current situation/symptoms discussed options. Continue current effective medications and return to see me in 3 months See orders and pt instructions for more details.  Medical Decision Making Problem Points:  Established problem, stable/improving (1), Review of last therapy session (1) and Review of psycho-social stressors (1) Data Points:  Review or order clinical lab tests (1) Review of medication regiment & side effects (2)  I certify that outpatient services furnished can reasonably be expected to improve the patient's condition.   [name], MD

## 2013-10-24 ENCOUNTER — Other Ambulatory Visit: Payer: Self-pay | Admitting: Family Medicine

## 2013-10-24 NOTE — Telephone Encounter (Signed)
Pt called stating he needs a refill on his RX he didn't realize he ran out until this weekend please advise

## 2013-11-14 ENCOUNTER — Telehealth (HOSPITAL_COMMUNITY): Payer: Self-pay | Admitting: *Deleted

## 2013-11-16 ENCOUNTER — Ambulatory Visit (HOSPITAL_COMMUNITY): Payer: Self-pay | Admitting: Psychiatry

## 2013-11-27 ENCOUNTER — Other Ambulatory Visit (HOSPITAL_COMMUNITY): Payer: Self-pay | Admitting: Psychiatry

## 2013-11-28 ENCOUNTER — Telehealth: Payer: Self-pay

## 2013-11-28 NOTE — Telephone Encounter (Signed)
Pt was referred by Dr. Moshe Cipro for a colonoscopy. His last one was by Dr. Oneida Alar in April 2008.   He is on recall for 04/2016.   I called and LMOM for a return call to see if pt is having any problems or new family hx of colon cancer.

## 2013-12-01 ENCOUNTER — Telehealth: Payer: Self-pay

## 2013-12-01 NOTE — Telephone Encounter (Signed)
Pt had received a letter about scheduling a colonoscopy. ( Referred by Dr. Moshe Cipro).  He said he is not ready to schedule yet, he shattered his right wrist a few months ago and he wants to wait til that heals better.  He also wanted to let Dr. Oneida Alar know that he has some nausea whenever he is needing to have a BM and it subsides when he uses the bathroom.  He said he tried Amitiza once ( he doesn't remember the strength). He could not tolerate taking it daily, but thinks it might help if he tries one every 2-3 days.  He would like to know if Dr. Oneida Alar would advise that.

## 2013-12-04 ENCOUNTER — Other Ambulatory Visit: Payer: Self-pay | Admitting: Family Medicine

## 2013-12-08 NOTE — Telephone Encounter (Signed)
PLEASE CALL PT. HIS LAST VISIT WAS MAY 2014. HE NEEDS AOP[facility] TO DISCUSS MANAGEMENT OF HIS CONSTIPATION. HE MAY SEE HIS PCP FOR AMITIZA IF HE NEEDS A RX. OTHERWISE HE SHOULD USE [facility].

## 2013-12-11 ENCOUNTER — Telehealth: Payer: Self-pay

## 2013-12-11 NOTE — Telephone Encounter (Signed)
Called and informed pt. OK to schedule an office visit for him.

## 2013-12-11 NOTE — Telephone Encounter (Signed)
He needs to explain to prescribing MD why he is trying to hold out on visit, so that they can work this out together

## 2013-12-11 NOTE — Telephone Encounter (Signed)
Patient states he was getting samples of amitiza from Dr Oneida Alar and called them for a prescription and they said he couldn't get any without a visit. He doesn't want to go back to them until he's ready for his colonoscopy and wants you to prescribe it. He said it was the lowest dose.

## 2013-12-12 ENCOUNTER — Encounter: Payer: Self-pay | Admitting: Gastroenterology

## 2013-12-12 NOTE — Telephone Encounter (Signed)
Message left for patient on his personal voicemail

## 2013-12-12 NOTE — Telephone Encounter (Signed)
Pt is aware of OV on 6/10 at 2 with SF and appt card was mailed to physical address and [address]

## 2013-12-22 ENCOUNTER — Other Ambulatory Visit (HOSPITAL_COMMUNITY): Payer: Self-pay | Admitting: Orthopedic Surgery

## 2013-12-22 DIAGNOSIS — M25539 Pain in unspecified wrist: Secondary | ICD-10-CM

## 2013-12-26 ENCOUNTER — Ambulatory Visit (HOSPITAL_COMMUNITY)
Admission: RE | Admit: 2013-12-26 | Discharge: 2013-12-26 | Disposition: A | Discharge status: Home / Self Care | Payer: PRIVATE HEALTH INSURANCE | Source: Ambulatory Visit | Attending: Orthopedic Surgery | Admitting: Orthopedic Surgery

## 2013-12-26 DIAGNOSIS — X58XXXA Exposure to other specified factors, initial encounter: Secondary | ICD-10-CM | POA: Insufficient documentation

## 2013-12-26 DIAGNOSIS — S52609A Unspecified fracture of lower end of unspecified ulna, initial encounter for closed fracture: Secondary | ICD-10-CM | POA: Insufficient documentation

## 2013-12-26 DIAGNOSIS — M25539 Pain in unspecified wrist: Secondary | ICD-10-CM

## 2013-12-27 NOTE — Telephone Encounter (Signed)
Pt has OV with Dr. Oneida Alar on 01/17/2014.

## 2014-01-05 ENCOUNTER — Other Ambulatory Visit (HOSPITAL_COMMUNITY): Payer: Self-pay | Admitting: Psychiatry

## 2014-01-11 ENCOUNTER — Telehealth (HOSPITAL_COMMUNITY): Payer: Self-pay | Admitting: *Deleted

## 2014-01-11 NOTE — Telephone Encounter (Signed)
done

## 2014-01-15 ENCOUNTER — Other Ambulatory Visit: Payer: Self-pay | Admitting: Orthopedic Surgery

## 2014-01-16 ENCOUNTER — Encounter (HOSPITAL_COMMUNITY): Payer: Self-pay | Admitting: Psychiatry

## 2014-01-16 ENCOUNTER — Ambulatory Visit (INDEPENDENT_AMBULATORY_CARE_PROVIDER_SITE_OTHER): Payer: PRIVATE HEALTH INSURANCE | Admitting: Psychiatry

## 2014-01-16 VITALS — BP 100/70 | Ht 70.0 in | Wt 165.0 lb

## 2014-01-16 DIAGNOSIS — F331 Major depressive disorder, recurrent, moderate: Secondary | ICD-10-CM

## 2014-01-16 DIAGNOSIS — F3289 Other specified depressive episodes: Secondary | ICD-10-CM

## 2014-01-16 DIAGNOSIS — G4700 Insomnia, unspecified: Secondary | ICD-10-CM

## 2014-01-16 DIAGNOSIS — F329 Major depressive disorder, single episode, unspecified: Secondary | ICD-10-CM

## 2014-01-16 MED ORDER — CLONAZEPAM 0.5 MG PO TABS: 0.5000 mg | ORAL_TABLET | Freq: Three times a day (TID) | ORAL | Status: DC | PRN

## 2014-01-16 MED ORDER — GABAPENTIN 400 MG PO CAPS: 400.0000 mg | ORAL_CAPSULE | Freq: Three times a day (TID) | ORAL | Status: DC

## 2014-01-16 MED ORDER — QUETIAPINE FUMARATE 50 MG PO TABS: 50.0000 mg | ORAL_TABLET | Freq: Two times a day (BID) | ORAL | Status: DC

## 2014-01-16 MED ORDER — FLUOXETINE HCL 20 MG PO CAPS: 20.0000 mg | ORAL_CAPSULE | Freq: Every day | ORAL | Status: DC

## 2014-01-16 NOTE — Progress Notes (Signed)
Patient ID: JAMYRON REDD, male   DOB: 17-Jun-1956, 58 y.o.   MRN: [id number] Patient ID: JUSTAN GAEDE, male   DOB: [birth year], 58 y.o.   MRN: [id number] Patient ID: KHAM ZUCKERMAN, male   DOB: [birth year], 58 y.o.   MRN: [id number] Patient ID: ABDOULIE TIERCE, male   DOB: Nov 05, 1955, 58 y.o.   MRN: [id number] [facility] [address] Progress Note EARNEST MCGILLIS MRN: [id number] DOB: 1955/10/09 Age: 58 y.o.  Date: 01/16/2014 Start Time: 1:15 PM End Time: 1:35 PM  Chief Complaint: Chief Complaint  Patient presents with  . Anxiety  . Depression  . 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 He has one son age 24 years old and lives in [place]  The patient states he has a history depression that started around 2009. Prior to that year he been treated with ribavirin to treat hepatitis C which made him very depressed. He's also had a number of deaths in his family-both sisters have died as well as both parents in the last several years. His son is very close to him and he only talks to him periodically.  Currently the patient is struggling with medical problems including neck and back pain. He recently had a neurostimulator installed in his back which is helped to some degree. Lately he's been a bit more depressed because his aunt keeps calling and wanting to talk about all the dead relatives people he's been draining a lot about people who have died. He's not been seeing his counselor regularly and I think it would be helpful for him to get back into counseling. He does have friends and he walks his dog 2 miles a day. He admits to passive suicidal ideation but would never hurt himself.  The patient returns for followup after 3 months. He is going to have to have a second surgery on his right arm on June 22. He's still having a lot of pain in  the arm and hopefully the surgery will help. He's been a little bit more depressed because of the pain in his arm and back however he does not want to change his medications or antidepressant. The pain wakes him from sleep as well. He denies suicidal ideation and is still getting out and doing things with friends and walking his dog.  .  Current psychiatric medication Prozac 20 mg in AM Seroquel 50 mg 3 times a day and 2 at bedtime   Klonopin 0.5 mg 1-2 tablet  Vitamin D 50,000 IU weekly prescribed by PCP  Past psychiatric history Patient has history of depression for many years. He has taken in the past Wellbutrin Cymbalta and Celexa. He denies any history of suicidal attempt.  Alcohol and substance use history Patient has significant history of alcohol with multiple rehabilitation and detox treatment. He has history of DWI. He also had history of taking recreational drugs and he was in the [employer]. He has history of intravenous drug use that cause hepatitis C. Patient also had history of using pain medication.  Allergies: Allergies  Allergen Reactions  . Levofloxacin Nausea Only and Other (See Comments)    "Creepy" feeling, skin didn't feel right, sort of itchy.  Baker Pierini [Lubiprostone]     MADE HIM FEEL STIFF  . Sulfa Antibiotics    Medical History: Past Medical History  Diagnosis Date  . Hepatitis C reactive     LIVER BX 2008-CHRONIC ACTIVE HEPATITIS  . Back problem   . GERD (gastroesophageal reflux disease)   . IBS (irritable bowel syndrome)   . HTN (hypertension)   . Hypercholesterolemia   . Hx of adenomatous colonic polyps 11-27-06    due for surveillance 11-26-16  . Knee pain   . Substance abuse     narcotic addiction  . Thyroiditis     History of  . Normal echocardiogram 05/25/2011    EF 55%, mild TR  . Normal cardiac stress test 05/25/2011    [facility] [place]  . Depression   . Hepatitis C 1979  Cervical fusion Neck surgery Neck pain Hepatitis  C Hypertension Benign tremors High blood pressure He sees Dr. Moshe Cipro and Dr. Paulita Cradle for pain management.  Surgical History: Past Surgical History  Procedure Laterality Date  . Appendectomy    . Tonsillectomy    . Carpal tunnel release    . Right ring finger    . Spinal stenosis, had screws put in neck  04/11/2009    DR KRITZER  . Upper gastrointestinal endoscopy  11-27-2006 [facility] ABD PAIN WEIGHT LOSS d100 v8 phen 12.5    NL  . Colonoscopy  11-27-06 [facility] ARS D100 V8 PHEN 12.5    2 SIMPLE ADENOMAS (< 1 CM)  . Knee arthroscopy    . Ulnar nerve transposition    . Esophagogastroduodenoscopy  04/26/2012    [facility]: Non-erosive gastritis (inflammation) was found in the gastric antrum but no H.pylori; multiple biopsies (duodenal bx negative for Celiac)/ The mucosa of the esophagus appeared normal  . Eus  09/08/2012    Procedure: UPPER ENDOSCOPIC ULTRASOUND (EUS) LINEAR;  Surgeon: Milus Banister, MD;  Location: [facility];  Service: Endoscopy;  Laterality: N/A;  . Neurostimulator implant    . Wrist surgery Right jan 2015    Dr. Edmonia Lynch   Family History: family history includes ADD / ADHD in his cousin; Alcohol abuse in his cousin, father, and paternal uncle; Anxiety disorder in his cousin; Bipolar disorder in his cousin; Cancer in his mother and sister; Dementia in his paternal aunt, paternal grandmother, and paternal uncle; Depression in his cousin; Emotional abuse in his sister and sister; Heart defect in an other family member; Heart failure in his father; OCD in his cousin; Paranoid behavior in his cousin; Stroke in his father and sister. There is no history of Colon cancer, Colon polyps, Drug abuse, Schizophrenia, Seizures, Sexual abuse, or Physical abuse. Reviewed again in the office and no changes were noted except the new pain issues  Psychosocial history Patient was born and raised in [place].  He married twice. 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 Patient has multiple family member who has died in past few years. His father died in 11/27/2007 and mother died in 26-Nov-2008  and 2 of his sister also died.  Patient lives by himself.  Mental status examination Patient is casually dressed and groomed.Marland Kitchen  He maintained fair eye contact. He described his mood as fair and his affect is mildly constricted.  His speech is slow but clear. His thought process is slow but logical linear and goal-directed.  He has mild tremors which are chronic .  He denies any active or passive suicidal thinking or homicidal thinking. He denies any auditory or visual hallucination. He is alert and oriented x3.  His attention and concentration  is distracted. There were no paranoia or delusions present at this time. His insight judgment and impulse control is okay.  Diagnosis Axis I Maj. depressive disorder, polysubstance dependence by history Axis II deferred Axis III see medical history Axis IV mild to moderate Axis V 65-70  Plan/Discussion: I took his vitals.  I reviewed CC, tobacco/med/surg Hx, meds effects/ side effects, problem list, therapies and responses as well as current situation/symptoms discussed options. Continue current effective medications and return to see me in 3 months See orders and pt instructions for more details.  Medical Decision Making Problem Points:  Established problem, stable/improving (1), Review of last therapy session (1) and Review of psycho-social stressors (1) Data Points:  Review or order clinical lab tests (1) Review of medication regiment & side effects (2)  I certify that outpatient services furnished can reasonably be expected to improve the patient's condition.   [name], MD

## 2014-01-17 ENCOUNTER — Ambulatory Visit: Payer: Self-pay | Admitting: Gastroenterology

## 2014-01-17 ENCOUNTER — Telehealth: Payer: Self-pay

## 2014-01-17 NOTE — Telephone Encounter (Signed)
Pt called to cancel his appt today. He is supposed to have surgery on his right wrist soon and will be in a cast for several weeks. He will call back later to reschedule.

## 2014-01-17 NOTE — Telephone Encounter (Signed)
REVIEWED.  

## 2014-01-18 ENCOUNTER — Ambulatory Visit (INDEPENDENT_AMBULATORY_CARE_PROVIDER_SITE_OTHER): Payer: PRIVATE HEALTH INSURANCE | Admitting: Psychiatry

## 2014-01-18 DIAGNOSIS — F331 Major depressive disorder, recurrent, moderate: Secondary | ICD-10-CM

## 2014-01-18 NOTE — Progress Notes (Signed)
   THERAPIST PROGRESS NOTE  Session Time: Thursday 01/18/2014 1:05 PM - 1:55 PM  Participation Level: Active  Behavioral Response: CasualAlert/less depressed, less anxious  Type of Therapy: Individual Therapy  Treatment Goals addressed: decrease anxiety, increase self-acceptance  Interventions: Supportive, CBT  Summary: Marc [name] is a 58 y.o. male who initially was referred by Dr. Adele Schilder to improve coping and social skills as patient was experiencing sadness, depression, grief, and anger issues. The patient has a history of recurrent periods of depression along with a history of chemical dependency. He has had psychiatric hospitalization due to suicidal thoughts and has participated in inpatient treatment as well as outpatient treatment for chemical dependency. He completed the chemical dependency IOP program in [place] in December 2012.   Since last session in March 2015, patient reports coping fairly well although he has had several medical issues. He is scheduled to have surgery on his arm on January 29, 2014. He reports being able to set and maintain boundaries with his friend whom he has had difficulty setting boundaries in the past. He has not expanded social network but has been more involved with his social network. He has been more productive and has been going out almost daily. He attends NA and a bible recovery group as well as visits his friends. He reports having mornings when he wakes up in depressed mood and makes negative comments about self. However, he reports being able to identify triggers, examine his thought patterns, and use coping statements. Reports continued abstinence from alcohol, illicit drugs, and any aprescription medications not prescribed for patient.   Suicidal/Homicidal: No  Therapist Response: Therapist works with patient to identify and verbalize feelings, reinforced patient's efforts to set and maintain boundaries as well as increase activity and social  involvement, discussed thought patterns and effects on mood and behavior  Plan: Return again in 4 weeks.  Diagnosis: Axis I: MDD    Axis II: No diagnosis    [name], LCSW 01/18/2014

## 2014-01-18 NOTE — Patient Instructions (Signed)
Discussed orally 

## 2014-01-23 ENCOUNTER — Encounter (HOSPITAL_BASED_OUTPATIENT_CLINIC_OR_DEPARTMENT_OTHER): Payer: Self-pay | Admitting: *Deleted

## 2014-01-23 NOTE — Progress Notes (Signed)
To go to [facility] for [id number] 

## 2014-01-24 NOTE — H&P (Signed)
Marc [name] is an 58 y.o. male.   CC / Reason for Visit: Right wrist stiffness and pain HPI: This patient returns following CT scan of the distal radius.  The scan suggests that perhaps there has been some settling on the ulnar side of the radius such that one of the pegs may now be intra-articular.  He confirms that he continues to have some pain, mostly ulnarly, and some stiffness.  In addition he has some intermittent numbness in the ring and small fingers.  Presenting history follows: This patient is a 58 year old male who presents for evaluation of right wrist pain and stiffness.  He underwent the above procedure after falling in the woods while walking his dog and sustaining a distal radius fracture.  He is continuing to take narcotics for pain management.  He is now just 4 months postop and has considerable stiffness, particularly with regard to supination.  He was not casted, but was splinted with motion begun fairly quickly, adding formal therapy as early as February 4.  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  Past Medical History  Diagnosis Date  . Hepatitis C reactive     LIVER BX 2008-CHRONIC ACTIVE HEPATITIS  . Back problem   . GERD (gastroesophageal reflux disease)   . IBS (irritable bowel syndrome)   . HTN (hypertension)   . Hypercholesterolemia   . Hx of adenomatous colonic polyps 2008    due for surveillance 2018  . Knee pain   . Substance abuse     narcotic addiction  . Thyroiditis     History of  . Normal echocardiogram 05/25/2011    EF 55%, mild TR  . Normal cardiac stress test 05/25/2011    [facility] [place]  . Depression   . Hepatitis C 1979    Past Surgical History  Procedure Laterality Date  . Appendectomy    . Tonsillectomy    . Carpal tunnel release      rt  . Right ring finger    . Spinal stenosis, had screws put in neck  04/11/2009    DR KRITZER  . Upper gastrointestinal endoscopy  2008 [facility] ABD PAIN WEIGHT LOSS d100 v8 phen 12.5     NL  . Colonoscopy  2008 [facility] ARS D100 V8 PHEN 12.5    2 SIMPLE ADENOMAS (< 1 CM)  . Knee arthroscopy    . Ulnar nerve transposition    . Esophagogastroduodenoscopy  04/26/2012    [facility]: Non-erosive gastritis (inflammation) was found in the gastric antrum but no H.pylori; multiple biopsies (duodenal bx negative for Celiac)/ The mucosa of the esophagus appeared normal  . Eus  09/08/2012    Procedure: UPPER ENDOSCOPIC ULTRASOUND (EUS) LINEAR;  Surgeon: Milus Banister, MD;  Location: [facility];  Service: Endoscopy;  Laterality: N/A;  . Neurostimulator implant    . Wrist surgery Right jan 2015    Dr. Edmonia Lynch    Family History  Problem Relation Age of Onset  . Heart defect      FAMILY HX  . Cancer Mother     BLADDER  . Cancer Sister     METS  . Emotional abuse Sister   . Stroke Sister   . Emotional abuse Sister   . Heart failure Father   . Stroke Father   . Alcohol abuse Father   . Colon cancer Neg Hx   . Colon polyps Neg Hx   . Drug abuse Neg Hx   . Schizophrenia  Neg Hx   . Seizures Neg Hx   . Sexual abuse Neg Hx   . Physical abuse Neg Hx   . Dementia Paternal Aunt   . Alcohol abuse Paternal Uncle   . Dementia Paternal Uncle   . Dementia Paternal Grandmother   . ADD / ADHD Cousin   . Bipolar disorder Cousin   . Anxiety disorder Cousin   . Depression Cousin     Committed suicide  . Alcohol abuse Cousin   . OCD Cousin   . Paranoid behavior Cousin    Social History:  reports that he quit smoking about 27 years ago. His smoking use included Cigarettes. He smoked 1.00 pack per day. He has never used smokeless tobacco. He reports that he does not drink alcohol or use illicit drugs.  Allergies:  Allergies  Allergen Reactions  . Levofloxacin Nausea Only and Other (See Comments)    "Creepy" feeling, skin didn't feel right, sort of itchy.  Baker Pierini [Lubiprostone]     MADE HIM FEEL STIFF  . Sulfa Antibiotics     No prescriptions prior to admission    No  results found for this or any previous visit (from the past 48 hour(s)). No results found.  Review of Systems  All other systems reviewed and are negative.   Height 5\' 10"  (1.778 m), weight 74.844 kg (165 lb). Physical Exam  Constitutional:  WD, WN, NAD HEENT:  NCAT, EOMI Neuro/Psych:  Alert & oriented to person, place, and time; appropriate mood & affect Lymphatic: No generalized UE edema or lymphadenopathy Extremities / MSK:  Both UE are normal with respect to appearance, ranges of motion, joint stability, muscle strength/tone, sensation, & perfusion except as otherwise noted:  The incision is healing nicely.  He has tenderness at the ulnocarpal region more so than about the radius.  Supinates to just 15, pronation 75, wrist extension 45, flexion 15.  Digital motion is nearly full.  2 point discrimination measures 3-4 mm across all the digits.    Labs / Xrays: No radiographic studies obtained today.  X-rays from 12-18-13 are reviewed and reveal a volar plate fixation of a distal radius fracture that appears healed.  There is some mushrooming of the distal radius, and the morphology of the sigmoid notch is not fully appreciable.  It appears that ulnar positivity of 2-3 mm exists.  Assessment:  Ongoing right ulnar-sided wrist pain and stiffness following above procedure  Plan:  I reviewed the information provided by the CT scan.  I recommended proceeding surgically with removal of the distal hardware from the radius, perhaps just 1 or 2 pegs in a more limited way then exposing the entire volar radius.  In the same setting, I would recommend proceeding with ulnar shortening osteotomy, then assessing intraoperatively pronation and supination to help determine whether any additional releases may be performed.    The details of the operative procedure were discussed with the patient.  Questions were invited and answered.  In addition to the goal of the procedure, the risks of the procedure to  include but not limited to bleeding; infection; damage to the nerves or blood vessels that could result in bleeding, numbness, weakness, chronic pain, and the need for additional procedures; stiffness; the need for revision surgery; and anesthetic risks, the worst of which is death, were reviewed.  No specific outcome was guaranteed or implied.  Informed consent was obtained.    THOMPSON, DAVID A. 01/24/2014, 6:57 PM

## 2014-01-25 ENCOUNTER — Encounter (HOSPITAL_COMMUNITY)
Admission: RE | Admit: 2014-01-25 | Discharge: 2014-01-25 | Disposition: A | Discharge status: Home / Self Care | Payer: PRIVATE HEALTH INSURANCE | Source: Ambulatory Visit | Attending: Orthopedic Surgery | Admitting: Orthopedic Surgery

## 2014-01-25 DIAGNOSIS — Z01812 Encounter for preprocedural laboratory examination: Secondary | ICD-10-CM | POA: Insufficient documentation

## 2014-01-25 DIAGNOSIS — Z01818 Encounter for other preprocedural examination: Secondary | ICD-10-CM | POA: Insufficient documentation

## 2014-01-25 LAB — BASIC METABOLIC PANEL
BUN: 23 mg/dL (ref 6–23)
CALCIUM: 9.2 mg/dL (ref 8.4–10.5)
CO2: 24 mEq/L (ref 19–32)
Chloride: 104 mEq/L (ref 96–112)
Creatinine, Ser: 1.1 mg/dL (ref 0.50–1.35)
GFR calc Af Amer: 82 mL/min — ABNORMAL LOW (ref >90)
GFR, EST NON AFRICAN AMERICAN: 71 mL/min — AB (ref >90)
GLUCOSE: 113 mg/dL — AB (ref 70–99)
Potassium: 4.3 mEq/L (ref 3.7–5.3)
SODIUM: 140 meq/L (ref 137–147)

## 2014-01-29 ENCOUNTER — Ambulatory Visit (HOSPITAL_BASED_OUTPATIENT_CLINIC_OR_DEPARTMENT_OTHER)
Admission: RE | Admit: 2014-01-29 | Payer: PRIVATE HEALTH INSURANCE | Source: Ambulatory Visit | Admitting: Orthopedic Surgery

## 2014-01-29 ENCOUNTER — Encounter (HOSPITAL_BASED_OUTPATIENT_CLINIC_OR_DEPARTMENT_OTHER): Payer: Self-pay | Admitting: Anesthesiology

## 2014-01-29 SURGERY — SHORTENING, ULNA
Anesthesia: General | Laterality: Right

## 2014-01-29 MED ORDER — PROPOFOL 10 MG/ML IV BOLUS
INTRAVENOUS | Status: AC
  Filled 2014-01-29: qty 80

## 2014-01-29 MED ORDER — MIDAZOLAM HCL 2 MG/2ML IJ SOLN
INTRAMUSCULAR | Status: AC
  Filled 2014-01-29: qty 2

## 2014-01-29 MED ORDER — LIDOCAINE HCL 2 % IJ SOLN
INTRAMUSCULAR | Status: AC
  Filled 2014-01-29: qty 20

## 2014-01-29 MED ORDER — BUPIVACAINE-EPINEPHRINE (PF) 0.5% -1:200000 IJ SOLN
INTRAMUSCULAR | Status: AC
  Filled 2014-01-29: qty 30

## 2014-01-29 MED ORDER — BUPIVACAINE HCL (PF) 0.25 % IJ SOLN
INTRAMUSCULAR | Status: AC
  Filled 2014-01-29: qty 30

## 2014-01-29 MED ORDER — PROPOFOL 10 MG/ML IV EMUL
INTRAVENOUS | Status: AC
  Filled 2014-01-29: qty 100

## 2014-01-29 MED ORDER — FENTANYL CITRATE 0.05 MG/ML IJ SOLN
INTRAMUSCULAR | Status: AC
  Filled 2014-01-29: qty 6

## 2014-01-29 MED ORDER — LIDOCAINE HCL (PF) 1 % IJ SOLN
INTRAMUSCULAR | Status: AC
  Filled 2014-01-29: qty 30

## 2014-02-05 ENCOUNTER — Other Ambulatory Visit: Payer: Self-pay | Admitting: Family Medicine

## 2014-02-06 ENCOUNTER — Encounter (HOSPITAL_BASED_OUTPATIENT_CLINIC_OR_DEPARTMENT_OTHER): Payer: Self-pay | Admitting: *Deleted

## 2014-02-06 NOTE — Progress Notes (Signed)
Pt had to r/s due to not having a ride Will bring meds-needs ekg-could not do AP due to nerve stimulater on

## 2014-02-12 ENCOUNTER — Ambulatory Visit (HOSPITAL_COMMUNITY): Payer: PRIVATE HEALTH INSURANCE

## 2014-02-12 ENCOUNTER — Ambulatory Visit (HOSPITAL_BASED_OUTPATIENT_CLINIC_OR_DEPARTMENT_OTHER): Payer: PRIVATE HEALTH INSURANCE | Admitting: Anesthesiology

## 2014-02-12 ENCOUNTER — Encounter (HOSPITAL_BASED_OUTPATIENT_CLINIC_OR_DEPARTMENT_OTHER): Payer: PRIVATE HEALTH INSURANCE | Admitting: Anesthesiology

## 2014-02-12 ENCOUNTER — Encounter (HOSPITAL_BASED_OUTPATIENT_CLINIC_OR_DEPARTMENT_OTHER)
Admission: RE | Disposition: A | Discharge status: Home / Self Care | Payer: Self-pay | Source: Ambulatory Visit | Attending: Orthopedic Surgery

## 2014-02-12 ENCOUNTER — Encounter (HOSPITAL_BASED_OUTPATIENT_CLINIC_OR_DEPARTMENT_OTHER): Payer: Self-pay | Admitting: Anesthesiology

## 2014-02-12 ENCOUNTER — Ambulatory Visit (HOSPITAL_BASED_OUTPATIENT_CLINIC_OR_DEPARTMENT_OTHER)
Admission: RE | Admit: 2014-02-12 | Discharge: 2014-02-12 | Disposition: A | Discharge status: Home / Self Care | Payer: PRIVATE HEALTH INSURANCE | Source: Ambulatory Visit | Attending: Orthopedic Surgery | Admitting: Orthopedic Surgery

## 2014-02-12 DIAGNOSIS — I10 Essential (primary) hypertension: Secondary | ICD-10-CM | POA: Diagnosis not present

## 2014-02-12 DIAGNOSIS — F1920 Other psychoactive substance dependence, uncomplicated: Secondary | ICD-10-CM | POA: Insufficient documentation

## 2014-02-12 DIAGNOSIS — Z87891 Personal history of nicotine dependence: Secondary | ICD-10-CM | POA: Diagnosis not present

## 2014-02-12 DIAGNOSIS — S42309S Unspecified fracture of shaft of humerus, unspecified arm, sequela: Secondary | ICD-10-CM | POA: Insufficient documentation

## 2014-02-12 DIAGNOSIS — F329 Major depressive disorder, single episode, unspecified: Secondary | ICD-10-CM | POA: Diagnosis not present

## 2014-02-12 DIAGNOSIS — K589 Irritable bowel syndrome without diarrhea: Secondary | ICD-10-CM | POA: Diagnosis not present

## 2014-02-12 DIAGNOSIS — B1920 Unspecified viral hepatitis C without hepatic coma: Secondary | ICD-10-CM | POA: Diagnosis not present

## 2014-02-12 DIAGNOSIS — F3289 Other specified depressive episodes: Secondary | ICD-10-CM | POA: Diagnosis not present

## 2014-02-12 DIAGNOSIS — IMO0002 Reserved for concepts with insufficient information to code with codable children: Secondary | ICD-10-CM | POA: Diagnosis not present

## 2014-02-12 DIAGNOSIS — W19XXXS Unspecified fall, sequela: Secondary | ICD-10-CM | POA: Insufficient documentation

## 2014-02-12 DIAGNOSIS — Z860100 Personal history of colon polyps, unspecified: Secondary | ICD-10-CM | POA: Insufficient documentation

## 2014-02-12 DIAGNOSIS — K219 Gastro-esophageal reflux disease without esophagitis: Secondary | ICD-10-CM | POA: Insufficient documentation

## 2014-02-12 DIAGNOSIS — E7800 Pure hypercholesterolemia, unspecified: Secondary | ICD-10-CM | POA: Insufficient documentation

## 2014-02-12 HISTORY — PX: HARDWARE REMOVAL: SHX979

## 2014-02-12 HISTORY — PX: ULNA OSTEOTOMY: SHX1077

## 2014-02-12 LAB — POCT HEMOGLOBIN-HEMACUE: Hemoglobin: 14.8 g/dL (ref 13.0–17.0)

## 2014-02-12 SURGERY — REMOVAL, HARDWARE
Anesthesia: Regional | Site: Arm Lower | Laterality: Right

## 2014-02-12 MED ORDER — FENTANYL CITRATE 0.05 MG/ML IJ SOLN
INTRAMUSCULAR | Status: DC | PRN
  Administered 2014-02-12 (×2): 50 ug via INTRAVENOUS

## 2014-02-12 MED ORDER — ONDANSETRON 4 MG PO TBDP
4.0000 mg | ORAL_TABLET | Freq: Once | ORAL | Status: AC
  Administered 2014-02-12: 4 mg via ORAL

## 2014-02-12 MED ORDER — ONDANSETRON 8 MG PO TBDP
ORAL_TABLET | ORAL | Status: AC
  Filled 2014-02-12: qty 1

## 2014-02-12 MED ORDER — LACTATED RINGERS IV SOLN
INTRAVENOUS | Status: DC
  Administered 2014-02-12 (×2): via INTRAVENOUS

## 2014-02-12 MED ORDER — CEFAZOLIN SODIUM-DEXTROSE 2-3 GM-% IV SOLR
INTRAVENOUS | Status: DC | PRN
  Administered 2014-02-12: 2 g via INTRAVENOUS

## 2014-02-12 MED ORDER — EPHEDRINE SULFATE 50 MG/ML IJ SOLN
INTRAMUSCULAR | Status: DC | PRN
  Administered 2014-02-12 (×2): 10 mg via INTRAVENOUS

## 2014-02-12 MED ORDER — CEFAZOLIN SODIUM-DEXTROSE 2-3 GM-% IV SOLR: 2.0000 g | INTRAVENOUS | Status: DC

## 2014-02-12 MED ORDER — ONDANSETRON HCL 4 MG/2ML IJ SOLN: 4.0000 mg | Freq: Four times a day (QID) | INTRAMUSCULAR | Status: DC | PRN

## 2014-02-12 MED ORDER — BUPIVACAINE-EPINEPHRINE (PF) 0.5% -1:200000 IJ SOLN
INTRAMUSCULAR | Status: DC | PRN
  Administered 2014-02-12: 30 mL
  Administered 2014-02-12: 30 mL via PERINEURAL

## 2014-02-12 MED ORDER — CHLORHEXIDINE GLUCONATE 4 % EX LIQD: 60.0000 mL | Freq: Once | CUTANEOUS | Status: DC

## 2014-02-12 MED ORDER — MIDAZOLAM HCL 2 MG/2ML IJ SOLN
INTRAMUSCULAR | Status: AC
Start: 2014-02-12 — End: 2014-02-12
  Filled 2014-02-12: qty 2

## 2014-02-12 MED ORDER — OXYCODONE HCL 5 MG/5ML PO SOLN: 5.0000 mg | Freq: Once | ORAL | Status: DC | PRN

## 2014-02-12 MED ORDER — MIDAZOLAM HCL 5 MG/5ML IJ SOLN
INTRAMUSCULAR | Status: DC | PRN
Start: 2014-02-12 — End: 2014-02-12
  Administered 2014-02-12: 2 mg via INTRAVENOUS

## 2014-02-12 MED ORDER — LACTATED RINGERS IV SOLN
INTRAVENOUS | Status: DC
Start: 2014-02-12 — End: 2014-02-12
  Administered 2014-02-12: 10:00:00 via INTRAVENOUS

## 2014-02-12 MED ORDER — CEFAZOLIN SODIUM-DEXTROSE 2-3 GM-% IV SOLR
INTRAVENOUS | Status: AC
  Filled 2014-02-12: qty 50

## 2014-02-12 MED ORDER — FENTANYL CITRATE 0.05 MG/ML IJ SOLN
INTRAMUSCULAR | Status: AC
  Filled 2014-02-12: qty 2

## 2014-02-12 MED ORDER — 0.9 % SODIUM CHLORIDE (POUR BTL) OPTIME
TOPICAL | Status: DC | PRN
  Administered 2014-02-12: 200 mL

## 2014-02-12 MED ORDER — MIDAZOLAM HCL 2 MG/2ML IJ SOLN
INTRAMUSCULAR | Status: AC
  Filled 2014-02-12: qty 2

## 2014-02-12 MED ORDER — MIDAZOLAM HCL 2 MG/2ML IJ SOLN
1.0000 mg | INTRAMUSCULAR | Status: DC | PRN
  Administered 2014-02-12: 2 mg via INTRAVENOUS

## 2014-02-12 MED ORDER — ONDANSETRON HCL 4 MG/2ML IJ SOLN
INTRAMUSCULAR | Status: DC | PRN
  Administered 2014-02-12: 4 mg via INTRAVENOUS

## 2014-02-12 MED ORDER — DEXAMETHASONE SODIUM PHOSPHATE 10 MG/ML IJ SOLN
INTRAMUSCULAR | Status: DC | PRN
  Administered 2014-02-12: 10 mg via INTRAVENOUS

## 2014-02-12 MED ORDER — PROPOFOL 10 MG/ML IV BOLUS
INTRAVENOUS | Status: DC | PRN
  Administered 2014-02-12: 200 mg via INTRAVENOUS

## 2014-02-12 MED ORDER — HYDROMORPHONE HCL PF 1 MG/ML IJ SOLN: 0.2500 mg | INTRAMUSCULAR | Status: DC | PRN

## 2014-02-12 MED ORDER — LIDOCAINE HCL (CARDIAC) 20 MG/ML IV SOLN
INTRAVENOUS | Status: DC | PRN
  Administered 2014-02-12: 30 mg via INTRAVENOUS

## 2014-02-12 MED ORDER — OXYCODONE-ACETAMINOPHEN 5-325 MG PO TABS: 1.0000 | ORAL_TABLET | ORAL | Status: DC | PRN

## 2014-02-12 MED ORDER — LIDOCAINE HCL (PF) 1 % IJ SOLN
INTRAMUSCULAR | Status: AC
  Filled 2014-02-12: qty 30

## 2014-02-12 MED ORDER — BUPIVACAINE-EPINEPHRINE (PF) 0.5% -1:200000 IJ SOLN
INTRAMUSCULAR | Status: AC
  Filled 2014-02-12: qty 30

## 2014-02-12 MED ORDER — OXYCODONE HCL 5 MG PO TABS: 5.0000 mg | ORAL_TABLET | Freq: Once | ORAL | Status: DC | PRN

## 2014-02-12 MED ORDER — FENTANYL CITRATE 0.05 MG/ML IJ SOLN
50.0000 ug | INTRAMUSCULAR | Status: DC | PRN
  Administered 2014-02-12: 100 ug via INTRAVENOUS

## 2014-02-12 MED ORDER — FENTANYL CITRATE 0.05 MG/ML IJ SOLN
INTRAMUSCULAR | Status: AC
  Filled 2014-02-12: qty 6

## 2014-02-12 SURGICAL SUPPLY — 88 items
APL SKNCLS STERI-STRIP NONHPOA (GAUZE/BANDAGES/DRESSINGS) ×1
BANDAGE COBAN STERILE 2 (GAUZE/BANDAGES/DRESSINGS) IMPLANT
BANDAGE ESMARK 6X9 LF (GAUZE/BANDAGES/DRESSINGS) IMPLANT
BENZOIN TINCTURE PRP APPL 2/3 (GAUZE/BANDAGES/DRESSINGS) ×1 IMPLANT
BIT DRILL 2.8X5 QR DISP (BIT) ×1 IMPLANT
BIT DRILL QUICK RELEASE 3.5MM (BIT) IMPLANT
BLADE AVERAGE 25X9 (BLADE) ×1 IMPLANT
BLADE MINI RND TIP GREEN BEAV (BLADE) IMPLANT
BLADE SAW OSTEOTOMY (BLADE) ×1 IMPLANT
BLADE SURG 15 STRL LF DISP TIS (BLADE) ×2 IMPLANT
BLADE SURG 15 STRL SS (BLADE) ×4
BNDG CMPR 9X4 STRL LF SNTH (GAUZE/BANDAGES/DRESSINGS) ×1
BNDG CMPR 9X6 STRL LF SNTH (GAUZE/BANDAGES/DRESSINGS)
BNDG COHESIVE 4X5 TAN STRL (GAUZE/BANDAGES/DRESSINGS) ×2 IMPLANT
BNDG ESMARK 4X9 LF (GAUZE/BANDAGES/DRESSINGS) ×2 IMPLANT
BNDG ESMARK 6X9 LF (GAUZE/BANDAGES/DRESSINGS)
BNDG GAUZE 1X2.1 STRL (MISCELLANEOUS) IMPLANT
BNDG GAUZE ELAST 4 BULKY (GAUZE/BANDAGES/DRESSINGS) ×4 IMPLANT
BRUSH SCRUB EZ PLAIN DRY (MISCELLANEOUS) IMPLANT
BUR EGG 3PK/BX (BURR) IMPLANT
CANISTER LINER 1300 C W/ELBOW (MISCELLANEOUS) ×1 IMPLANT
CANISTER SUCT 1200ML W/VALVE (MISCELLANEOUS) ×2 IMPLANT
CHLORAPREP W/TINT 26ML (MISCELLANEOUS) ×2 IMPLANT
CORDS BIPOLAR (ELECTRODE) ×2 IMPLANT
COVER MAYO STAND STRL (DRAPES) ×2 IMPLANT
COVER TABLE BACK 60X90 (DRAPES) ×2 IMPLANT
CUFF TOURNIQUET SINGLE 18IN (TOURNIQUET CUFF) ×1 IMPLANT
CUFF TOURNIQUET SINGLE 24IN (TOURNIQUET CUFF) IMPLANT
CUFF TOURNIQUET SINGLE 34IN LL (TOURNIQUET CUFF) IMPLANT
DRAIN PENROSE 1/2X12 LTX STRL (WOUND CARE) IMPLANT
DRAPE C-ARM 42X72 X-RAY (DRAPES) ×2 IMPLANT
DRAPE EXTREMITY T 121X128X90 (DRAPE) ×2 IMPLANT
DRAPE OEC MINIVIEW 54X84 (DRAPES) IMPLANT
DRAPE SURG 17X23 STRL (DRAPES) ×2 IMPLANT
DRILL QUICK RELEASE 3.5MM (BIT) ×2
DRSG ADAPTIC 3X8 NADH LF (GAUZE/BANDAGES/DRESSINGS) ×2 IMPLANT
DRSG EMULSION OIL 3X3 NADH (GAUZE/BANDAGES/DRESSINGS) ×2 IMPLANT
DRSG PAD ABDOMINAL 8X10 ST (GAUZE/BANDAGES/DRESSINGS) ×1 IMPLANT
ELECT REM PT RETURN 9FT ADLT (ELECTROSURGICAL) ×2
ELECTRODE REM PT RTRN 9FT ADLT (ELECTROSURGICAL) ×1 IMPLANT
GAUZE SPONGE 4X4 12PLY STRL (GAUZE/BANDAGES/DRESSINGS) ×2 IMPLANT
GLOVE BIO SURGEON STRL SZ7.5 (GLOVE) ×2 IMPLANT
GLOVE BIOGEL PI IND STRL 7.0 (GLOVE) IMPLANT
GLOVE BIOGEL PI IND STRL 8 (GLOVE) ×1 IMPLANT
GLOVE BIOGEL PI INDICATOR 7.0 (GLOVE) ×1
GLOVE BIOGEL PI INDICATOR 8 (GLOVE) ×1
GLOVE ECLIPSE 6.5 STRL STRAW (GLOVE) ×2 IMPLANT
GLOVE EXAM NITRILE MD LF STRL (GLOVE) ×1 IMPLANT
GOWN STRL REUS W/ TWL LRG LVL3 (GOWN DISPOSABLE) ×2 IMPLANT
GOWN STRL REUS W/TWL LRG LVL3 (GOWN DISPOSABLE) ×4
NDL HYPO 25X1 1.5 SAFETY (NEEDLE) IMPLANT
NEEDLE HYPO 25X1 1.5 SAFETY (NEEDLE) IMPLANT
NS IRRIG 1000ML POUR BTL (IV SOLUTION) ×2 IMPLANT
PACK BASIN DAY SURGERY FS (CUSTOM PROCEDURE TRAY) ×2 IMPLANT
PADDING CAST ABS 4INX4YD NS (CAST SUPPLIES)
PADDING CAST ABS COTTON 4X4 ST (CAST SUPPLIES) IMPLANT
PENCIL BUTTON HOLSTER BLD 10FT (ELECTRODE) ×2 IMPLANT
PLATE ULNAR SHORTENING (Plate) ×1 IMPLANT
RUBBERBAND STERILE (MISCELLANEOUS) IMPLANT
SCREW HEXALOBE LOCKING 3.5X16M (Screw) ×1 IMPLANT
SCREW HEXALOBE NON-LOCK 3.5X14 (Screw) ×2 IMPLANT
SCREW HEXALOBE NON-LOCK 3.5X16 (Screw) ×1 IMPLANT
SCREW LOCK 12X3.5X HEXALOBE (Screw) IMPLANT
SCREW LOCKING 3.5X12 (Screw) ×4 IMPLANT
SCREW NONLOCK HEX 3.5X12 (Screw) ×1 IMPLANT
SLEEVE SCD COMPRESS KNEE MED (MISCELLANEOUS) ×2 IMPLANT
SLING ARM LRG ADULT FOAM STRAP (SOFTGOODS) ×1 IMPLANT
SPLINT PLASTER CAST XFAST 3X15 (CAST SUPPLIES) IMPLANT
SPLINT PLASTER CAST XFAST 4X15 (CAST SUPPLIES) IMPLANT
SPLINT PLASTER XTRA FAST SET 4 (CAST SUPPLIES) ×7
SPLINT PLASTER XTRA FASTSET 3X (CAST SUPPLIES)
STOCKINETTE 4X48 STRL (DRAPES) ×2 IMPLANT
STOCKINETTE 6  STRL (DRAPES)
STOCKINETTE 6 STRL (DRAPES) IMPLANT
STRIP CLOSURE SKIN 1/2X4 (GAUZE/BANDAGES/DRESSINGS) ×1 IMPLANT
SUCTION FRAZIER TIP 10 FR DISP (SUCTIONS) ×1 IMPLANT
SUT STEEL 7 (SUTURE) ×1 IMPLANT
SUT VIC AB 2-0 CT3 27 (SUTURE) ×1 IMPLANT
SUT VIC AB 2-0 PS2 27 (SUTURE) ×1 IMPLANT
SUT VICRYL 4-0 PS2 18IN ABS (SUTURE) IMPLANT
SUT VICRYL RAPIDE 4-0 (SUTURE) IMPLANT
SUT VICRYL RAPIDE 4/0 PS 2 (SUTURE) ×2 IMPLANT
SYR BULB 3OZ (MISCELLANEOUS) ×2 IMPLANT
SYRINGE 12CC LL (MISCELLANEOUS) IMPLANT
TOWEL OR 17X24 6PK STRL BLUE (TOWEL DISPOSABLE) ×2 IMPLANT
TOWEL OR NON WOVEN STRL DISP B (DISPOSABLE) ×2 IMPLANT
TUBE CONNECTING 20X1/4 (TUBING) ×1 IMPLANT
UNDERPAD 30X30 INCONTINENT (UNDERPADS AND DIAPERS) ×2 IMPLANT

## 2014-02-12 NOTE — Anesthesia Preprocedure Evaluation (Signed)
Anesthesia Evaluation  Patient identified by MRN, date of birth, ID band Patient awake    Reviewed: Allergy & Precautions, H&P , NPO status , Patient's Chart, lab work & pertinent test results  Airway Mallampati: II  Neck ROM: full    Dental   Pulmonary former smoker,          Cardiovascular hypertension,     Neuro/Psych Depression    GI/Hepatic GERD-  ,(+)     substance abuse  , Hepatitis -, C  Endo/Other    Renal/GU      Musculoskeletal   Abdominal   Peds  Hematology   Anesthesia Other Findings   Reproductive/Obstetrics                           Anesthesia Physical Anesthesia Plan  ASA: II  Anesthesia Plan: General and Regional   Post-op Pain Management: MAC Combined w/ Regional for Post-op pain   Induction: Intravenous  Airway Management Planned: LMA  Additional Equipment:   Intra-op Plan:   Post-operative Plan:   Informed Consent: I have reviewed the patients History and Physical, chart, labs and discussed the procedure including the risks, benefits and alternatives for the proposed anesthesia with the patient or authorized representative who has indicated his/her understanding and acceptance.     Plan Discussed with: CRNA, Anesthesiologist and Surgeon  Anesthesia Plan Comments:         Anesthesia Quick Evaluation

## 2014-02-12 NOTE — Op Note (Signed)
02/12/2014  9:53 AM  PATIENT:  Marc [name]  58 y.o. male  PRE-OPERATIVE DIAGNOSIS:  Right distal radius malunion with shortening  POST-OPERATIVE DIAGNOSIS:  Same  PROCEDURE:  Right distal radius hardware removal and through separate incision ulnar shortening osteotomy  SURGEON: Rayvon Char. Grandville Silos, MD  PHYSICIAN ASSISTANT: None  ANESTHESIA:  regional and general  SPECIMENS:  None  DRAINS:   None  PREOPERATIVE INDICATIONS:  [name] is a  59 y.o. male with history of comminuted distal radius fracture which underwent ORIF. He is healed with shortening, resultant ulnar positivity of about 2-3 mm, with ulnar-sided wrist pain and diminished supination. Advanced imaging suggested the possibility of some intra-articular hardware in the radiocarpal joint as well. We discussed an operative plan of removing distal radius pegs, and shortening the ulna approximately 3 mm.  The risks benefits and alternatives were discussed with the patient preoperatively including but not limited to the risks of infection, bleeding, nerve injury, cardiopulmonary complications, the need for revision surgery, among others, and the patient verbalized understanding and consented to proceed.  OPERATIVE IMPLANTS: Acumed ulnar shortening osteotomy plate and screws, with 18-gauge cerclage wire augmenting fixation distally  OPERATIVE PROCEDURE:  After receiving prophylactic antibiotics and a regional block, the patient was escorted to the operative theatre and placed in a supine position. General anesthesia was administered A surgical "time-out" was performed during which the planned procedure, proposed operative site, and the correct patient identity were compared to the operative consent and agreement confirmed by the circulating nurse according to current facility policy.  Following application of a tourniquet to the operative extremity, the exposed skin was prepped with Chloraprep and draped in the usual sterile  fashion.  The limb was exsanguinated with an Esmarch bandage and the tourniquet inflated to approximately 122mmHg higher than systolic BP.  A portion of the distal radius incision was incised, elliptically excising  the distal scar. Spreading dissection was then carried down to the level of the distal radius, sweeping the carpal contents ulnarly. There was minimal soft tissue over the distal radius, and he was easily scraped the side. All the distal radius pegs were removed. Imaging confirmed this to be the case.  Attention was then directed to the ulnar shortening osteotomy where a separate linear longitudinal incision was made on the ulnar aspect of the forearm over the subcutaneous border of the ulna. Skin was incised sharply with scalpel, deep fascia incised sharply as well. The volar surface of the ulna was exposed through subperiosteal elevation. The Acumed plate was laid along the volar ulnar aspect of the bone, with its distal in 3-5 cm from the end of the ulna. It was applied in standard fashion, making the osteotomy cuts with the jig, first set on one and then on 3.  The bone was irrigated to prevent thermal necrosis. Efforts to close the osteotomy resulted in lack of good opposition on the surface away from the plate, so the plate was removed and pre-bent into slight bend to help with better approximation. The 2 portions of the ulna seems to want to both rather than to slide in a translating fashion, and ultimately all of the hardware in the plate was then placed. It was combination of locking and nonlocking screws. There was some gapping at the osteotomy site, particularly on the surface away from the bone and this was to be acceptably, using some of the resected bone this graft. Radiographs revealed shortening of the ulna such that the ulna was  now neutral.  As I attempted to more fully supinate the forearm, a portion of the proximal aspect of the distal stump of the ulna cracked and the  interfragmentary screw lost good purchase. This resulted in further gapping at the osteotomy site. It was closed with a clamp and an 18-gauge cerclage wire placed, improving alignment. The wound was irrigated and the bone previously resected was then morselized and placed into the osteotomy site.   Final images were obtained. Supination was improved to about 45, but I did not push it beyond that point. The fascia was closed with a running 2-0 Vicryl suture.  The tourniquet was released, additional hemostasis obtained, and the skin was closed with running 4-0 Vicryl Rapide subcuticular suture with Mastisol and Steri-Strips.  This was for both of the wounds.  A bulky short arm splint dressing was applied. He was awakened and taken to room stable condition, breathing spontaneously.   DISPOSITION: He'll be discharged home with instructions, returning in 10-15 days for reevaluation with new extended x-rays of the right wrist to include all of the ulnar plate at that time.  I'll determine whether to proceed with short arm casting versus removable mobilization at that time.

## 2014-02-12 NOTE — Anesthesia Postprocedure Evaluation (Signed)
Anesthesia Post Note  Patient: Marc [name]  Procedure(s) Performed: Procedure(s) (LRB): HARDWARE REMOVAL (Right) RIGHT ULNAR SHORTENING AND OSTEOTOMY  (Right)  Anesthesia type: General  Patient location: PACU  Post pain: Pain level controlled and Adequate analgesia  Post assessment: Post-op Vital signs reviewed, Patient's Cardiovascular Status Stable, Respiratory Function Stable, Patent Airway and Pain level controlled  Last Vitals:  Filed Vitals:   02/12/14 1300  BP: 130/78  Pulse: 79  Temp:   Resp: 17    Post vital signs: Reviewed and stable  Level of consciousness: awake, alert  and oriented  Complications: No apparent anesthesia complications

## 2014-02-12 NOTE — H&P (View-Only) (Signed)
Marc [name] is an 58 y.o. male.   CC / Reason for Visit: Right wrist stiffness and pain HPI: This patient returns following CT scan of the distal radius.  The scan suggests that perhaps there has been some settling on the ulnar side of the radius such that one of the pegs may now be intra-articular.  He confirms that he continues to have some pain, mostly ulnarly, and some stiffness.  In addition he has some intermittent numbness in the ring and small fingers.  Presenting history follows: This patient is a 57 year old male who presents for evaluation of right wrist pain and stiffness.  He underwent the above procedure after falling in the woods while walking his dog and sustaining a distal radius fracture.  He is continuing to take narcotics for pain management.  He is now just 4 months postop and has considerable stiffness, particularly with regard to supination.  He was not casted, but was splinted with motion begun fairly quickly, adding formal therapy as early as February 4.  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  Past Medical History  Diagnosis Date  . Hepatitis C reactive     LIVER BX 2008-CHRONIC ACTIVE HEPATITIS  . Back problem   . GERD (gastroesophageal reflux disease)   . IBS (irritable bowel syndrome)   . HTN (hypertension)   . Hypercholesterolemia   . Hx of adenomatous colonic polyps 2008    due for surveillance 2018  . Knee pain   . Substance abuse     narcotic addiction  . Thyroiditis     History of  . Normal echocardiogram 05/25/2011    EF 55%, mild TR  . Normal cardiac stress test 05/25/2011    [facility] [place]  . Depression   . Hepatitis C 1979    Past Surgical History  Procedure Laterality Date  . Appendectomy    . Tonsillectomy    . Carpal tunnel release      rt  . Right ring finger    . Spinal stenosis, had screws put in neck  04/11/2009    DR KRITZER  . Upper gastrointestinal endoscopy  2008 [facility] ABD PAIN WEIGHT LOSS d100 v8 phen 12.5     NL  . Colonoscopy  2008 [facility] ARS D100 V8 PHEN 12.5    2 SIMPLE ADENOMAS (< 1 CM)  . Knee arthroscopy    . Ulnar nerve transposition    . Esophagogastroduodenoscopy  04/26/2012    [facility]: Non-erosive gastritis (inflammation) was found in the gastric antrum but no H.pylori; multiple biopsies (duodenal bx negative for Celiac)/ The mucosa of the esophagus appeared normal  . Eus  09/08/2012    Procedure: UPPER ENDOSCOPIC ULTRASOUND (EUS) LINEAR;  Surgeon: Milus Banister, MD;  Location: [facility];  Service: Endoscopy;  Laterality: N/A;  . Neurostimulator implant    . Wrist surgery Right jan 2015    Dr. Edmonia Lynch    Family History  Problem Relation Age of Onset  . Heart defect      FAMILY HX  . Cancer Mother     BLADDER  . Cancer Sister     METS  . Emotional abuse Sister   . Stroke Sister   . Emotional abuse Sister   . Heart failure Father   . Stroke Father   . Alcohol abuse Father   . Colon cancer Neg Hx   . Colon polyps Neg Hx   . Drug abuse Neg Hx   . Schizophrenia  Neg Hx   . Seizures Neg Hx   . Sexual abuse Neg Hx   . Physical abuse Neg Hx   . Dementia Paternal Aunt   . Alcohol abuse Paternal Uncle   . Dementia Paternal Uncle   . Dementia Paternal Grandmother   . ADD / ADHD Cousin   . Bipolar disorder Cousin   . Anxiety disorder Cousin   . Depression Cousin     Committed suicide  . Alcohol abuse Cousin   . OCD Cousin   . Paranoid behavior Cousin    Social History:  reports that he quit smoking about 27 years ago. His smoking use included Cigarettes. He smoked 1.00 pack per day. He has never used smokeless tobacco. He reports that he does not drink alcohol or use illicit drugs.  Allergies:  Allergies  Allergen Reactions  . Levofloxacin Nausea Only and Other (See Comments)    "Creepy" feeling, skin didn't feel right, sort of itchy.  Baker Pierini [Lubiprostone]     MADE HIM FEEL STIFF  . Sulfa Antibiotics     No prescriptions prior to admission    No  results found for this or any previous visit (from the past 48 hour(s)). No results found.  Review of Systems  All other systems reviewed and are negative.   Height 5\' 10"  (1.778 m), weight 74.844 kg (165 lb). Physical Exam  Constitutional:  WD, WN, NAD HEENT:  NCAT, EOMI Neuro/Psych:  Alert & oriented to person, place, and time; appropriate mood & affect Lymphatic: No generalized UE edema or lymphadenopathy Extremities / MSK:  Both UE are normal with respect to appearance, ranges of motion, joint stability, muscle strength/tone, sensation, & perfusion except as otherwise noted:  The incision is healing nicely.  He has tenderness at the ulnocarpal region more so than about the radius.  Supinates to just 15, pronation 75, wrist extension 45, flexion 15.  Digital motion is nearly full.  2 point discrimination measures 3-4 mm across all the digits.    Labs / Xrays: No radiographic studies obtained today.  X-rays from 12-18-13 are reviewed and reveal a volar plate fixation of a distal radius fracture that appears healed.  There is some mushrooming of the distal radius, and the morphology of the sigmoid notch is not fully appreciable.  It appears that ulnar positivity of 2-3 mm exists.  Assessment:  Ongoing right ulnar-sided wrist pain and stiffness following above procedure  Plan:  I reviewed the information provided by the CT scan.  I recommended proceeding surgically with removal of the distal hardware from the radius, perhaps just 1 or 2 pegs in a more limited way then exposing the entire volar radius.  In the same setting, I would recommend proceeding with ulnar shortening osteotomy, then assessing intraoperatively pronation and supination to help determine whether any additional releases may be performed.    The details of the operative procedure were discussed with the patient.  Questions were invited and answered.  In addition to the goal of the procedure, the risks of the procedure to  include but not limited to bleeding; infection; damage to the nerves or blood vessels that could result in bleeding, numbness, weakness, chronic pain, and the need for additional procedures; stiffness; the need for revision surgery; and anesthetic risks, the worst of which is death, were reviewed.  No specific outcome was guaranteed or implied.  Informed consent was obtained.    THOMPSON, DAVID A. 01/24/2014, 6:57 PM

## 2014-02-12 NOTE — Discharge Instructions (Addendum)
Discharge Instructions   You have a dressing with a plaster splint incorporated in it. Move your fingers as much as possible, making a full fist and fully opening the fist, BUT NO LIFTING, GRIPPING, OR GRASPING WITH YOUR RIGHT HAND MORE THAN PAPER, PENCIL, KNIFE, FORK, ETC. Elevate your hand to reduce pain & swelling of the digits.  Ice over the operative site may be helpful to reduce pain & swelling.  DO NOT USE HEAT. Pain medicine has been prescribed for you.  Use your medicine as needed over the first 48 hours, and then you can begin to taper your use.  You may use Tylenol in place of your prescribed pain medication, but not IN ADDITION to it. Leave the dressing in place until you return to our office.  You may shower, but keep the bandage clean & dry.  You may drive a car when you are off of prescription pain medications and can safely control your vehicle with both hands.   Please call [phone] during normal business hours or [phone] after hours for any problems. Including the following:  - excessive redness of the incisions - drainage for more than 4 days - fever of more than 101.5 F  *Please note that pain medications will not be refilled after hours or on weekends.   Post Anesthesia Home Care Instructions  Activity: Get plenty of rest for the remainder of the day. A responsible adult should stay with you for 24 hours following the procedure.  For the next 24 hours, DO NOT: -Drive a car -Paediatric nurse -Drink alcoholic beverages -Take any medication unless instructed by your physician -Make any legal decisions or sign important papers.  Meals: Start with liquid foods such as gelatin or soup. Progress to regular foods as tolerated. Avoid greasy, spicy, heavy foods. If nausea and/or vomiting occur, drink only clear liquids until the nausea and/or vomiting subsides. Call your physician if vomiting continues.  Special Instructions/Symptoms: Your throat may feel dry  or sore from the anesthesia or the breathing tube placed in your throat during surgery. If this causes discomfort, gargle with warm salt water. The discomfort should disappear within 24 hours. Regional Anesthesia Blocks  1. Numbness or the inability to move the "blocked" extremity may last from 3-48 hours after placement. The length of time depends on the medication injected and your individual response to the medication. If the numbness is not going away after 48 hours, call your surgeon.  2. The extremity that is blocked will need to be protected until the numbness is gone and the  Strength has returned. Because you cannot feel it, you will need to take extra care to avoid injury. Because it may be weak, you may have difficulty moving it or using it. You may not know what position it is in without looking at it while the block is in effect.  3. For blocks in the legs and feet, returning to weight bearing and walking needs to be done carefully. You will need to wait until the numbness is entirely gone and the strength has returned. You should be able to move your leg and foot normally before you try and bear weight or walk. You will need someone to be with you when you first try to ensure you do not fall and possibly risk injury.  4. Bruising and tenderness at the needle site are common side effects and will resolve in a few days.  5. Persistent numbness or new problems with movement should be communicated  to the surgeon or the [facility] [phone] [place] [phone]).

## 2014-02-12 NOTE — Interval H&P Note (Signed)
History and Physical Interval Note:  02/12/2014 9:42 AM  Marc [name]  has presented today for surgery, with the diagnosis of right wrist pain  The various methods of treatment have been discussed with the patient and family. After consideration of risks, benefits and other options for treatment, the patient has consented to  Procedure(s): RIGHT ULNAR SHORTENING AND OSTEOTOMY AND HARDWARE REMOVAL (Right) HARDWARE REMOVAL (Right) as a surgical intervention .  The patient's history has been reviewed, patient examined, no change in status, stable for surgery.  I have reviewed the patient's chart and labs.  Questions were answered to the patient's satisfaction.     Arliene Rosenow A.

## 2014-02-12 NOTE — Progress Notes (Signed)
Assisted Dr. Hodierne with right, ultrasound guided, supraclavicular block. Side rails up, monitors on throughout procedure. See vital signs in flow sheet. Tolerated Procedure well.  

## 2014-02-12 NOTE — Anesthesia Procedure Notes (Addendum)
Procedure Name: LMA Insertion Date/Time: 02/12/2014 10:28 AM Performed by: Toula Moos L Pre-anesthesia Checklist: Patient identified, Emergency Drugs available, Suction available, Patient being monitored and Timeout performed Patient Re-evaluated:Patient Re-evaluated prior to inductionOxygen Delivery Method: Circle System Utilized Preoxygenation: Pre-oxygenation with 100% oxygen Intubation Type: IV induction Ventilation: Mask ventilation without difficulty LMA: LMA inserted LMA Size: 5.0 Number of attempts: 1 Airway Equipment and Method: bite block Placement Confirmation: positive ETCO2 and breath sounds checked- equal and bilateral Tube secured with: Tape Dental Injury: Teeth and Oropharynx as per pre-operative assessment    Anesthesia Regional Block:  Supraclavicular block  Pre-Anesthetic Checklist: ,, timeout performed, Correct Patient, Correct Site, Correct Laterality, Correct Procedure, Correct Position, site marked, Risks and benefits discussed,  Surgical consent,  Pre-op evaluation,  At surgeon's request and post-op pain management  Laterality: Right  Prep: chloraprep       Needles:  Injection technique: Single-shot  Needle Type: Echogenic Stimulator Needle     Needle Length: 5cm 5 cm Needle Gauge: 22 and 22 G    Additional Needles:  Procedures: ultrasound guided (picture in chart) and nerve stimulator Supraclavicular block  Nerve Stimulator or Paresthesia:  Response: biceps flexion, 0.45 mA,   Additional Responses:   Narrative:  Start time: 02/12/2014 10:02 AM End time: 02/12/2014 10:17 AM Injection made incrementally with aspirations every 5 mL.  Performed by: Personally  Anesthesiologist: Dr Marcie Bal  Additional Notes: Functioning IV was confirmed and monitors were applied.  A 78mm 22ga Arrow echogenic stimulator needle was used. Sterile prep and drape,hand hygiene and sterile gloves were used.  Negative aspiration and negative test dose prior to  incremental administration of local anesthetic. The patient tolerated the procedure well.  Ultrasound guidance: relevent anatomy identified, needle position confirmed, local anesthetic spread visualized around nerve(s), vascular puncture avoided.  Image printed for medical record.

## 2014-02-12 NOTE — Transfer of Care (Signed)
Immediate Anesthesia Transfer of Care Note  Patient: Marc [name]  Procedure(s) Performed: Procedure(s): HARDWARE REMOVAL (Right) RIGHT ULNAR SHORTENING AND OSTEOTOMY  (Right)  Patient Location: PACU  Anesthesia Type:General  Level of Consciousness: awake, sedated and patient cooperative  Airway & Oxygen Therapy: Patient Spontanous Breathing and Patient connected to face mask oxygen  Post-op Assessment: Report given to PACU RN and Post -op Vital signs reviewed and stable  Post vital signs: Reviewed and stable  Complications: No apparent anesthesia complications

## 2014-02-13 ENCOUNTER — Encounter (HOSPITAL_BASED_OUTPATIENT_CLINIC_OR_DEPARTMENT_OTHER): Payer: Self-pay | Admitting: Orthopedic Surgery

## 2014-02-15 ENCOUNTER — Ambulatory Visit (HOSPITAL_COMMUNITY): Payer: Self-pay | Admitting: Psychiatry

## 2014-02-20 ENCOUNTER — Other Ambulatory Visit: Payer: Self-pay | Admitting: Family Medicine

## 2014-02-23 ENCOUNTER — Emergency Department (HOSPITAL_COMMUNITY)
Admission: EM | Admit: 2014-02-23 | Discharge: 2014-02-23 | Disposition: A | Discharge status: Home / Self Care | Payer: PRIVATE HEALTH INSURANCE | Attending: Emergency Medicine | Admitting: Emergency Medicine

## 2014-02-23 ENCOUNTER — Encounter (HOSPITAL_COMMUNITY): Payer: Self-pay | Admitting: Emergency Medicine

## 2014-02-23 DIAGNOSIS — Z860100 Personal history of colon polyps, unspecified: Secondary | ICD-10-CM | POA: Insufficient documentation

## 2014-02-23 DIAGNOSIS — Z79899 Other long term (current) drug therapy: Secondary | ICD-10-CM | POA: Insufficient documentation

## 2014-02-23 DIAGNOSIS — Z8639 Personal history of other endocrine, nutritional and metabolic disease: Secondary | ICD-10-CM | POA: Insufficient documentation

## 2014-02-23 DIAGNOSIS — Z9889 Other specified postprocedural states: Secondary | ICD-10-CM | POA: Insufficient documentation

## 2014-02-23 DIAGNOSIS — Z8619 Personal history of other infectious and parasitic diseases: Secondary | ICD-10-CM | POA: Insufficient documentation

## 2014-02-23 DIAGNOSIS — K219 Gastro-esophageal reflux disease without esophagitis: Secondary | ICD-10-CM | POA: Insufficient documentation

## 2014-02-23 DIAGNOSIS — Y838 Other surgical procedures as the cause of abnormal reaction of the patient, or of later complication, without mention of misadventure at the time of the procedure: Secondary | ICD-10-CM | POA: Insufficient documentation

## 2014-02-23 DIAGNOSIS — T8131XA Disruption of external operation (surgical) wound, not elsewhere classified, initial encounter: Secondary | ICD-10-CM | POA: Insufficient documentation

## 2014-02-23 DIAGNOSIS — Z862 Personal history of diseases of the blood and blood-forming organs and certain disorders involving the immune mechanism: Secondary | ICD-10-CM | POA: Insufficient documentation

## 2014-02-23 DIAGNOSIS — F3289 Other specified depressive episodes: Secondary | ICD-10-CM | POA: Insufficient documentation

## 2014-02-23 DIAGNOSIS — I10 Essential (primary) hypertension: Secondary | ICD-10-CM | POA: Insufficient documentation

## 2014-02-23 DIAGNOSIS — F329 Major depressive disorder, single episode, unspecified: Secondary | ICD-10-CM | POA: Insufficient documentation

## 2014-02-23 DIAGNOSIS — Z87891 Personal history of nicotine dependence: Secondary | ICD-10-CM | POA: Insufficient documentation

## 2014-02-23 NOTE — ED Provider Notes (Signed)
Medical screening examination/treatment/procedure(s) were performed by non-physician practitioner and as supervising physician I was immediately available for consultation/collaboration.   EKG Interpretation None        Maudry Diego, MD 02/23/14 2209

## 2014-02-23 NOTE — ED Notes (Signed)
Pt has open surgical wound on rt forearm, pt had surgery on 6/29 to correct a prior surgical problem. Pt states he was taking a shower and noticed blood in the shower. Wound was bleeding on pt's arrival, dressing applied in triage, bleeding controlled at the moment.

## 2014-02-23 NOTE — Discharge Instructions (Signed)
Wound Dehiscence Wound dehiscence is when a surgical cut (incision) opens up and does not heal properly. It usually happens 7-10 days after surgery. You may have bleeding from the cut. You may also have pain or a fever. This condition should be treated early. HOME CARE  Only take medicines as told by your doctor.  Take your antibiotic medicine as told. Finish it even if you start to feel better.  Wash your wound with warm, soapy water 2 times a day, starting on Monday, in the meantime,  Keep your wound as dry as possible. Pat the wound dry. Do not rub the wound.  Change bandages (dressings) daily. Wash your hands before and after changing bandages. Apply bandages as told.  Take showers. Do not soak the wound, bathe, swim, or use a hot tub until directed by your doctor.  Avoid exercises that make you sweat.  Use medicines that stop itching as told by your doctor. The wound may itch as it heals. Do not pick or scratch at the wound.  Do not lift more than 10 pounds (4.5 kilograms) until the wound is healed, or as told by your doctor.  Keep all doctor visits as told. GET HELP IF:  You have a lot of bleeding from your surgical cut.  Your wound does not seem to be healing right.  You have a fever. GET HELP RIGHT AWAY IF:   You have more puffiness (swelling) or redness around the wound.  You have more pain in the wound.  You have yellowish white fluid (pus) coming from the wound.  More of the wound breaks open. MAKE SURE YOU:   Understand these instructions.  Will watch your condition.  Will get help right away if you are not doing well or get worse. Document Released: 07/15/2009 Document Revised: 08/01/2013 Document Reviewed: 04/03/2013 [facility] Patient Information 2015 [place], [place]. This information is not intended to replace advice given to you by your health care provider. Make sure you discuss any questions you have with your health care provider.

## 2014-02-23 NOTE — ED Notes (Signed)
Pt reports had surgery on R wrist June 29th.  Reports was told could take a shower today.  Pt says while in the shower wound came open and started bleeding.  Triage RN dressed wound with pressure dressing in triage.  Will remove dressing when PA assesses pt. Bleeding controlled at this time.

## 2014-02-23 NOTE — ED Provider Notes (Signed)
CSN: [id number]     Arrival date & time 02/23/14  1803 History   First MD Initiated Contact with Patient 02/23/14 1838     Chief Complaint  Patient presents with  . Wound Dehiscence     (Consider location/radiation/quality/duration/timing/severity/associated sxs/prior Treatment) The history is provided by the patient.   Marc [name] is a 58 y.o. male presenting with dehiscence of surgical wound occurring just prior to arrival while he was showering.  He had right wrist surgery on June 29 with followup care with Dr. Grandville Silos his surgeon 2 days ago at which time his Steri-Strips were removed and he was instructed that he could start showering but not soaking his wound.  He denies pain at the site, but has been unable to stop the bleeding.  He does not take blood thinners, denies any injury to the site.  He had just finished walking his dog for 45 minutes prior to taking a shower and states his wrist splint was pretty wet with sweat after this walk.  There has been no swelling or drainage, no surrounding redness around the surgical site.    Past Medical History  Diagnosis Date  . Hepatitis C reactive     LIVER BX 2008-CHRONIC ACTIVE HEPATITIS  . Back problem   . GERD (gastroesophageal reflux disease)   . IBS (irritable bowel syndrome)   . HTN (hypertension)   . Hypercholesterolemia   . Hx of adenomatous colonic polyps 2008    due for surveillance 2018  . Knee pain   . Substance abuse     narcotic addiction  . Thyroiditis     History of  . Normal echocardiogram 05/25/2011    EF 55%, mild TR  . Normal cardiac stress test 05/25/2011    [facility] [place]  . Depression   . Hepatitis C 1979   Past Surgical History  Procedure Laterality Date  . Appendectomy    . Tonsillectomy    . Carpal tunnel release      rt  . Right ring finger    . Spinal stenosis, had screws put in neck  04/11/2009    DR KRITZER  . Upper gastrointestinal endoscopy  2008 [facility] ABD PAIN WEIGHT LOSS  d100 v8 phen 12.5    NL  . Colonoscopy  2008 [facility] ARS D100 V8 PHEN 12.5    2 SIMPLE ADENOMAS (< 1 CM)  . Knee arthroscopy    . Ulnar nerve transposition    . Esophagogastroduodenoscopy  04/26/2012    [facility]: Non-erosive gastritis (inflammation) was found in the gastric antrum but no H.pylori; multiple biopsies (duodenal bx negative for Celiac)/ The mucosa of the esophagus appeared normal  . Eus  09/08/2012    Procedure: UPPER ENDOSCOPIC ULTRASOUND (EUS) LINEAR;  Surgeon: Milus Banister, MD;  Location: [facility];  Service: Endoscopy;  Laterality: N/A;  . Neurostimulator implant    . Wrist surgery Right jan 2015    Dr. Edmonia Lynch  . Hardware removal Right 02/12/2014    Procedure: HARDWARE REMOVAL;  Surgeon: Jolyn Nap, MD;  Location: [place];  Service: Orthopedics;  Laterality: Right;  . Ulna osteotomy Right 02/12/2014    Procedure: RIGHT ULNAR SHORTENING AND OSTEOTOMY ;  Surgeon: Jolyn Nap, MD;  Location: [place];  Service: Orthopedics;  Laterality: Right;   Family History  Problem Relation Age of Onset  . Heart defect      FAMILY HX  . Cancer Mother  BLADDER  . Cancer Sister     METS  . Emotional abuse Sister   . Stroke Sister   . Emotional abuse Sister   . Heart failure Father   . Stroke Father   . Alcohol abuse Father   . Colon cancer Neg Hx   . Colon polyps Neg Hx   . Drug abuse Neg Hx   . Schizophrenia Neg Hx   . Seizures Neg Hx   . Sexual abuse Neg Hx   . Physical abuse Neg Hx   . Dementia Paternal Aunt   . Alcohol abuse Paternal Uncle   . Dementia Paternal Uncle   . Dementia Paternal Grandmother   . ADD / ADHD Cousin   . Bipolar disorder Cousin   . Anxiety disorder Cousin   . Depression Cousin     Committed suicide  . Alcohol abuse Cousin   . OCD Cousin   . Paranoid behavior Cousin    History  Substance Use Topics  . Smoking status: Former Smoker -- 1.00 packs/day    Types: Cigarettes    Quit date:  08/13/1986  . Smokeless tobacco: Never Used     Comment: quit 20 + yrs ago  . Alcohol Use: No    Review of Systems  Constitutional: Negative for fever and chills.  Respiratory: Negative for shortness of breath and wheezing.   Skin: Positive for wound.  Neurological: Negative for numbness.      Allergies  Levofloxacin; Amitiza; and Sulfa antibiotics  Home Medications   Prior to Admission medications   Medication Sig Start Date End Date Taking? Authorizing Provider  amLODipine (NORVASC) 5 MG tablet TAKE ONE TABLET BY MOUTH ONCE DAILY.    Fayrene Helper, MD  aspirin-acetaminophen-caffeine [facility] MIGRAINE) [phone] MG per tablet Take 2 tablets by mouth daily as needed for pain.    Historical Provider, MD  clonazePAM (KLONOPIN) 0.5 MG tablet Take 0.5 mg by mouth 3 (three) times daily as needed for anxiety. 01/16/14 01/16/15  [name], MD  cloNIDine (CATAPRES) 0.1 MG tablet TAKE (1) TABLET BY MOUTH TWICE DAILY. 02/20/14   Fayrene Helper, MD  docusate sodium (COLACE) 100 MG capsule Take 100 mg by mouth 2 (two) times daily.    Historical Provider, MD  FLUoxetine (PROZAC) 20 MG capsule Take 1 capsule (20 mg total) by mouth daily before breakfast. 01/16/14   [name], MD  gabapentin (NEURONTIN) 400 MG capsule Take 1 capsule (400 mg total) by mouth 3 (three) times daily. 01/16/14 01/16/15  [name], MD  HYDROcodone-acetaminophen ([place]) 7.5-325 MG per tablet Take 1 tablet by mouth every 6 (six) hours as needed for moderate pain.    Historical Provider, MD  ibuprofen (ADVIL,MOTRIN) 200 MG tablet Take 400-600 mg by mouth every 6 (six) hours as needed. For pain    Historical Provider, MD  methocarbamol (ROBAXIN) 750 MG tablet TAKE (1) TABLET BY MOUTH (3) TIMES DAILY.    Fayrene Helper, MD  omeprazole (PRILOSEC) 20 MG capsule Take 20 mg by mouth daily.    Historical Provider, MD  oxyCODONE-acetaminophen (ROXICET) 5-325 MG per tablet Take 1-2 tablets by mouth every 4 (four) hours  as needed for severe pain (then, beginning on 02-17-14, max 6 daily). 02/12/14   Jolyn Nap, MD  Probiotic Product (ALIGN) 4 MG CAPS Take 1 capsule by mouth daily.    Historical Provider, MD  QUEtiapine (SEROQUEL) 50 MG tablet Take 50-100 mg by mouth 3 (three) times daily. Take by mouth 1 tablet  3 times daily and 2 at bedtime 01/16/14   [name], MD   BP 127/77  Pulse 135  Temp(Src) 98.9 F (37.2 C) (Oral)  Resp 18  SpO2 95% Physical Exam  Constitutional: He is oriented to person, place, and time. He appears well-developed and well-nourished.  HENT:  Head: Normocephalic.  Cardiovascular: Normal rate.   Pulmonary/Chest: Effort normal.  Musculoskeletal: He exhibits no edema and no tenderness.  Well approximated surgical incision along the right lateral wrist.  There is an approximate 1/2 cm portion which has dehisced.  It is currently hemostatic.  Neurological: He is alert and oriented to person, place, and time. No sensory deficit.  Skin: Laceration noted.    ED Course  Procedures (including critical care time) Labs Review Labs Reviewed - No data to display  Imaging Review No results found.   EKG Interpretation None      MDM   Final diagnoses:  Wound dehiscence, initial encounter    Pt's area of dehiscence was hemostatic at time of exam. The length was approximately 0.5 cm and was strengthened by applying sterile strips.  Bulky dressing applied. Wound remained hemostatic during ed visit.  Advised keeping site dry for the next 2 days,  Then no soaking,  Showers only.  F/u with Dr. Grandville Silos and/or return here for any return of sx.   Pulse rate noted,  Repeated and 79 bpm.    [name], PA-C 02/23/14 [place], PA-C 02/23/14 1921

## 2014-03-02 ENCOUNTER — Ambulatory Visit (HOSPITAL_COMMUNITY): Payer: Self-pay | Admitting: Psychiatry

## 2014-03-19 ENCOUNTER — Ambulatory Visit (HOSPITAL_COMMUNITY): Payer: Self-pay | Admitting: Psychiatry

## 2014-04-02 ENCOUNTER — Ambulatory Visit (INDEPENDENT_AMBULATORY_CARE_PROVIDER_SITE_OTHER): Payer: PRIVATE HEALTH INSURANCE | Admitting: Psychiatry

## 2014-04-02 DIAGNOSIS — F331 Major depressive disorder, recurrent, moderate: Secondary | ICD-10-CM

## 2014-04-02 NOTE — Patient Instructions (Signed)
Discussed orally 

## 2014-04-02 NOTE — Progress Notes (Signed)
   THERAPIST PROGRESS NOTE  Session Time: Monday 04/02/2014 10:15 AM - 11:00 AM  Participation Level: Active  Behavioral Response: CasualAlertAnxious and Depressed  Type of Therapy: Individual Therapy  Treatment Goals addressed: decrease anxiety, increase self-acceptance  Interventions: CBT and Supportive  Summary: Marc [name] is a 58 y.o. male who initially was referred by Dr. Adele Schilder to improve coping and social skills as patient was experiencing sadness, depression, grief, and anger issues. The patient has a history of recurrent periods of depression along with a history of chemical dependency. He has had psychiatric hospitalization due to suicidal thoughts and has participated in inpatient treatment as well as outpatient treatment for chemical dependency. He completed the chemical dependency IOP program in [place] in December 2012.  Since last session in June 2015, patient has had increased medical issues. He had hand surgery in July but reports continued hand pain. He also had decreased manual dexterity which has affected daily personal care and functioning. Patient states feeling down as it doesn't seem that hand is getting better. His doctor is considering a bone graft. Patient also is frustrated with other medical issues and states starting to become nervous every time he has to attend a medical appointment. He has continued to do well in setting boundaries with one of his friends. He still has not expanded social network but has been more involved with another friend. He has maintained abstinence from alcohol, illicit drugs, and prescription from drugs not prescribed for patient.    Suicidal/Homicidal: No  Therapist Response: Therapist works with patient to process feelings, identify ways reduce and manage anxiety and depression through physiological, cognitive, and behavioral channels  Plan: Return again in 4 weeks.  Diagnosis: Axis I: MDD, recurrent, moderqte    Axis II:  Deferred    Carnita Golob, LCSW 04/02/2014

## 2014-04-03 ENCOUNTER — Other Ambulatory Visit: Payer: Self-pay | Admitting: Family Medicine

## 2014-04-11 ENCOUNTER — Ambulatory Visit (INDEPENDENT_AMBULATORY_CARE_PROVIDER_SITE_OTHER): Payer: PRIVATE HEALTH INSURANCE | Admitting: Psychiatry

## 2014-04-11 ENCOUNTER — Encounter (HOSPITAL_COMMUNITY): Payer: Self-pay | Admitting: Psychiatry

## 2014-04-11 VITALS — BP 134/83 | HR 72 | Ht 70.0 in | Wt 169.2 lb

## 2014-04-11 DIAGNOSIS — F331 Major depressive disorder, recurrent, moderate: Secondary | ICD-10-CM

## 2014-04-11 DIAGNOSIS — F3289 Other specified depressive episodes: Secondary | ICD-10-CM

## 2014-04-11 DIAGNOSIS — F329 Major depressive disorder, single episode, unspecified: Secondary | ICD-10-CM

## 2014-04-11 MED ORDER — FLUOXETINE HCL 20 MG PO CAPS: 20.0000 mg | ORAL_CAPSULE | Freq: Every day | ORAL | Status: DC

## 2014-04-11 MED ORDER — QUETIAPINE FUMARATE 50 MG PO TABS: ORAL_TABLET | ORAL | Status: DC

## 2014-04-11 MED ORDER — GABAPENTIN 400 MG PO CAPS
400.0000 mg | ORAL_CAPSULE | Freq: Three times a day (TID) | ORAL | Status: DC
Start: 2014-04-11 — End: 2014-07-25

## 2014-04-11 MED ORDER — CLONAZEPAM 0.5 MG PO TABS: 0.5000 mg | ORAL_TABLET | Freq: Three times a day (TID) | ORAL | Status: DC | PRN

## 2014-04-11 NOTE — Progress Notes (Signed)
Patient ID: Marc [name], male   DOB: 03/12/1956, 58 y.o.   MRN: [id number] Patient ID: Marc [name], male   DOB: 1956/04/27, 58 y.o.   MRN: [id number] Patient ID: Marc [name], male   DOB: Apr 19, 1956, 58 y.o.   MRN: [id number] Patient ID: Marc [name], male   DOB: [birth year], 58 y.o.   MRN: [id number] Patient ID: Marc [name], male   DOB: 26-Aug-1955, 58 y.o.   MRN: [id number] [facility] [id number] Progress Note Marc [name] MRN: [id number] DOB: [birth year] Age: 58 y.o.  Date: 04/11/2014 Start Time: 1:15 PM End Time: 1:35 PM  Chief Complaint: Chief Complaint  Patient presents with  . Anxiety  . Depression  . 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 He has one son age 39 years old and lives in [place]  The patient states he has a history depression that started around 2009. Prior to that year he been treated with ribavirin to treat hepatitis C which made him very depressed. He's also had a number of deaths in his family-both sisters have died as well as both parents in the last several years. His son is very close to him and he only talks to him periodically.  Currently the patient is struggling with medical problems including neck and back pain. He recently had a neurostimulator installed in his back which is helped to some degree. Lately he's been a bit more depressed because his aunt keeps calling and wanting to talk about all the dead relatives people he's been draining a lot about people who have died. He's not been seeing his counselor regularly and I think it would be helpful for him to get back into counseling. He does have friends and he walks his dog 2 miles a day. He admits to passive suicidal ideation but would never hurt himself.  The patient returns for followup after 3 months. He had a second surgery on his  right arm in June. It still taped up and quite painful. He's discouraged with all this because he may now need a bone graft. He doesn't want to change it in his psychiatric medications but he is still somewhat depressed. He is not suicidal.  .  Current psychiatric medication Prozac 20 mg in AM Seroquel 50 mg 3 times a day and 2 at bedtime   Klonopin 0.5 mg 1-2 tablet  Vitamin D 50,000 IU weekly prescribed by PCP  Past psychiatric history Patient has history of depression for many years. He has taken in the past Wellbutrin Cymbalta and Celexa. He denies any history of suicidal attempt.  Alcohol and substance use history Patient has significant history of alcohol with multiple rehabilitation and detox treatment. He has history of DWI. He also had history of taking recreational drugs and he was in the [employer]. He has history of intravenous drug use that cause hepatitis C. Patient also had history of using pain medication.  Allergies: Allergies  Allergen Reactions  . Levofloxacin Nausea Only and Other (See Comments)    "Creepy" feeling, skin didn't feel right, sort of itchy.  Baker Pierini [Lubiprostone]     MADE HIM FEEL STIFF  . Sulfa Antibiotics    Medical History: Past Medical History  Diagnosis Date  . Hepatitis C reactive     LIVER BX 2008-CHRONIC ACTIVE HEPATITIS  .  Back problem   . GERD (gastroesophageal reflux disease)   . IBS (irritable bowel syndrome)   . HTN (hypertension)   . Hypercholesterolemia   . Hx of adenomatous colonic polyps 2006/11/16    due for surveillance 11-15-2016  . Knee pain   . Substance abuse     narcotic addiction  . Thyroiditis     History of  . Normal echocardiogram 05/25/2011    EF 55%, mild TR  . Normal cardiac stress test 05/25/2011    [facility] [place]  . Depression   . Hepatitis C 1979  Cervical fusion Neck surgery Neck pain Hepatitis C Hypertension Benign tremors High blood pressure He sees Dr. Moshe Cipro and Dr. Paulita Cradle for pain  management.  Surgical History: Past Surgical History  Procedure Laterality Date  . Appendectomy    . Tonsillectomy    . Carpal tunnel release      rt  . Right ring finger    . Spinal stenosis, had screws put in neck  04/11/2009    DR KRITZER  . Upper gastrointestinal endoscopy  11-16-06 [facility] ABD PAIN WEIGHT LOSS d100 v8 phen 12.5    NL  . Colonoscopy  2006/11/16 [facility] ARS D100 V8 PHEN 12.5    2 SIMPLE ADENOMAS (< 1 CM)  . Knee arthroscopy    . Ulnar nerve transposition    . Esophagogastroduodenoscopy  04/26/2012    [facility]: Non-erosive gastritis (inflammation) was found in the gastric antrum but no H.pylori; multiple biopsies (duodenal bx negative for Celiac)/ The mucosa of the esophagus appeared normal  . Eus  09/08/2012    Procedure: UPPER ENDOSCOPIC ULTRASOUND (EUS) LINEAR;  Surgeon: Milus Banister, MD;  [facility];  Service: Endoscopy;  Laterality: N/A;  . Neurostimulator implant    . Wrist surgery Right jan 2015    Dr. Edmonia Lynch  . Hardware removal Right 02/12/2014    Procedure: HARDWARE REMOVAL;  Surgeon: Jolyn Nap, MD;  Location: [place];  Service: Orthopedics;  Laterality: Right;  . Ulna osteotomy Right 02/12/2014    Procedure: RIGHT ULNAR SHORTENING AND OSTEOTOMY ;  Surgeon: Jolyn Nap, MD;  Location: [place];  Service: Orthopedics;  Laterality: Right;   Family History: family history includes ADD / ADHD in his cousin; Alcohol abuse in his cousin, father, and paternal uncle; Anxiety disorder in his cousin; Bipolar disorder in his cousin; Cancer in his mother and sister; Dementia in his paternal aunt, paternal grandmother, and paternal uncle; Depression in his cousin; Emotional abuse in his sister and sister; Heart defect in an other family member; Heart failure in his father; OCD in his cousin; Paranoid behavior in his cousin; Stroke in his father and sister. There is no history of Colon cancer, Colon polyps, Drug abuse, Schizophrenia,  Seizures, Sexual abuse, or Physical abuse. Reviewed again in the office and no changes were noted except the new pain issues  Psychosocial history Patient was born and raised in [place].  He married twice. 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 Patient has multiple family member who has died in past few years. His father died in 11-16-2007 and mother died in 11-15-2008  and 2 of his sister also died.  Patient lives by himself.  Mental status examination Patient is casually dressed and groomed.. his right arm is in a soft cast  He maintained fair eye contact. He described his mood as fair and his affect is  mildly constricted.  His speech is slow but clear. His thought process is slow but logical linear and goal-directed.  He has mild tremors which are chronic .  He denies any active or passive suicidal thinking or homicidal thinking. He denies any auditory or visual hallucination. He is alert and oriented x3.  His attention and concentration is distracted. There were no paranoia or delusions present at this time. His insight judgment and impulse control is okay.  Diagnosis Axis I Maj. depressive disorder, polysubstance dependence by history Axis II deferred Axis III see medical history Axis IV mild to moderate Axis V 65-70  Plan/Discussion: I took his vitals.  I reviewed CC, tobacco/med/surg Hx, meds effects/ side effects, problem list, therapies and responses as well as current situation/symptoms discussed options. Continue current effective medications and return to see me in 3 months See orders and pt instructions for more details.  Medical Decision Making Problem Points:  Established problem, stable/improving (1), Review of last therapy session (1) and Review of psycho-social stressors (1) Data Points:  Review or order clinical lab tests (1) Review of medication regiment & side effects (2)  I certify that outpatient services furnished can reasonably be  expected to improve the patient's condition.   [name], MD

## 2014-04-18 ENCOUNTER — Ambulatory Visit (HOSPITAL_COMMUNITY): Payer: Self-pay | Admitting: Psychiatry

## 2014-04-19 ENCOUNTER — Ambulatory Visit: Payer: PRIVATE HEALTH INSURANCE | Admitting: Family Medicine

## 2014-04-30 ENCOUNTER — Ambulatory Visit (INDEPENDENT_AMBULATORY_CARE_PROVIDER_SITE_OTHER): Payer: PRIVATE HEALTH INSURANCE | Admitting: Psychiatry

## 2014-04-30 DIAGNOSIS — F331 Major depressive disorder, recurrent, moderate: Secondary | ICD-10-CM

## 2014-04-30 NOTE — Progress Notes (Signed)
   THERAPIST PROGRESS NOTE  Session Time: Monday 04/30/2014 1:10 PM - 1:55 PM  Participation Level: Active  Behavioral Response: CasualAlert/less depressed/ anxious  Type of Therapy: Individual Therapy  Treatment Goals addressed: Implement coping skills resulting in decreased anxiety  Interventions: CBT and Supportive  Summary: Marc [name] is a 58 y.o. male  who initially was referred by Dr. Adele Schilder to improve coping and social skills as patient was experiencing sadness, depression, grief, and anger issues. The patient has a history of recurrent periods of depression along with a history of chemical dependency. He has had psychiatric hospitalization due to suicidal thoughts and has participated in inpatient treatment as well as outpatient treatment for chemical dependency. He completed the chemical dependency IOP program in [place] in December 2012.  Since last session, patient reports improved mood and increased social involvement . He has improved eating patterns and reports increased energy. He reports decreased stress regarding insurance as his attorney is providing free assistance regarding enrolling patient in medicare program. He is experiencing increased dreams about his deceased family members. They are disturbing and patient refers to the dreams as nightmares. He misses family members and expresses regrets he did not make amends. Patient reports maintaining abstinence from alcohol, illicit drugs, and any medication not prescribed for patient.   Suicidal/Homicidal: No  Therapist Response: Therapist works with patient to reinforce social involvement and improved eating patterns, process feelings regarding relationship with family  Plan: Return again in 3-4 weeks.  Diagnosis: Axis I: MDD, Recurrent, Moderate    Axis II: No diagnosis    BYNUM,PEGGY, LCSW 04/30/2014

## 2014-04-30 NOTE — Patient Instructions (Signed)
Discussed orally 

## 2014-05-14 ENCOUNTER — Telehealth: Payer: Self-pay | Admitting: Family Medicine

## 2014-05-14 MED ORDER — AMLODIPINE BESYLATE 5 MG PO TABS: ORAL_TABLET | ORAL | Status: DC

## 2014-05-14 NOTE — Telephone Encounter (Signed)
Refill sent.

## 2014-05-18 ENCOUNTER — Other Ambulatory Visit: Payer: Self-pay | Admitting: Family Medicine

## 2014-05-25 ENCOUNTER — Other Ambulatory Visit: Payer: Self-pay

## 2014-05-28 ENCOUNTER — Ambulatory Visit (INDEPENDENT_AMBULATORY_CARE_PROVIDER_SITE_OTHER): Payer: PRIVATE HEALTH INSURANCE | Admitting: Psychiatry

## 2014-05-28 DIAGNOSIS — F331 Major depressive disorder, recurrent, moderate: Secondary | ICD-10-CM

## 2014-05-28 NOTE — Progress Notes (Signed)
   THERAPIST PROGRESS NOTE  Session Time: Monday 05/28/2014  1:10 PM - 2:05 PM  Participation Level: Active  Behavioral Response: CasualAlertAnxious and Depressed  Type of Therapy: Individual Therapy  Treatment Goals addressed:  Implement coping skills resulting in decreased anxiety  Interventions: CBT and Supportive  Summary: Marc [name] is a 58 y.o. male who initially was referred by Dr. Adele Schilder to improve coping and social skills as patient was experiencing sadness, depression, grief, and anger issues. The patient has a history of recurrent periods of depression along with a history of chemical dependency. He has had psychiatric hospitalization due to suicidal thoughts and has participated in inpatient treatment as well as outpatient treatment for chemical dependency. He completed the chemical dependency IOP program in [place] in December 2012.   Since last session, patient reports increased anxiety and frustration regarding his health issues as his right arm is not healing as quickly as he had hoped and he has to to perform daily procedures using a bone healing stimulator on arm as well as continue procedures he already was using for the neurostimulator in his back. He did write letter to his father as agreed upon in last session. Patient initially experienced increased nightmares. He read letter in session today. Patient continues to experience confusion and frustration regarding his father's actions in patient's childhood. He also expresses frustration with self as he struggles with whether or not he actually has forgiven his father.   Suicidal/Homicidal: No  Therapist Response: Therapist works with patient to praise his efforts in completing homework assignment, discuss emotional awareness and impact of childhood on emotional development, regulation, and awareness, identify and verbalize feelings, introduce feelings wheel and identify ways to use  Plan: Return again in 4 weeks.  Patient agrees to use feelings wheel and feelings list to identify feelings regarding situations patient referenced in letter.  Diagnosis: Axis I: MDD, Recurrent, Moderate    Axis II: No diagnosis    BYNUM,PEGGY, LCSW 05/28/2014

## 2014-05-28 NOTE — Patient Instructions (Signed)
Discussed orally 

## 2014-06-12 ENCOUNTER — Ambulatory Visit (INDEPENDENT_AMBULATORY_CARE_PROVIDER_SITE_OTHER): Payer: PRIVATE HEALTH INSURANCE | Admitting: Family Medicine

## 2014-06-12 ENCOUNTER — Encounter: Payer: Self-pay | Admitting: Family Medicine

## 2014-06-12 VITALS — BP 126/82 | HR 100 | Resp 18 | Ht 72.0 in | Wt 168.0 lb

## 2014-06-12 DIAGNOSIS — F419 Anxiety disorder, unspecified: Secondary | ICD-10-CM

## 2014-06-12 DIAGNOSIS — J0120 Acute ethmoidal sinusitis, unspecified: Secondary | ICD-10-CM

## 2014-06-12 DIAGNOSIS — I10 Essential (primary) hypertension: Secondary | ICD-10-CM

## 2014-06-12 DIAGNOSIS — K219 Gastro-esophageal reflux disease without esophagitis: Secondary | ICD-10-CM

## 2014-06-12 DIAGNOSIS — R05 Cough: Secondary | ICD-10-CM

## 2014-06-12 DIAGNOSIS — R7309 Other abnormal glucose: Secondary | ICD-10-CM

## 2014-06-12 DIAGNOSIS — F329 Major depressive disorder, single episode, unspecified: Secondary | ICD-10-CM

## 2014-06-12 DIAGNOSIS — M549 Dorsalgia, unspecified: Secondary | ICD-10-CM

## 2014-06-12 DIAGNOSIS — Z125 Encounter for screening for malignant neoplasm of prostate: Secondary | ICD-10-CM

## 2014-06-12 DIAGNOSIS — F418 Other specified anxiety disorders: Secondary | ICD-10-CM

## 2014-06-12 DIAGNOSIS — R7303 Prediabetes: Secondary | ICD-10-CM

## 2014-06-12 DIAGNOSIS — R059 Cough, unspecified: Secondary | ICD-10-CM

## 2014-06-12 MED ORDER — AMLODIPINE BESYLATE 5 MG PO TABS: ORAL_TABLET | ORAL | Status: DC

## 2014-06-12 MED ORDER — PENICILLIN V POTASSIUM 500 MG PO TABS: 500.0000 mg | ORAL_TABLET | Freq: Three times a day (TID) | ORAL | Status: DC

## 2014-06-12 MED ORDER — CLONIDINE HCL 0.1 MG PO TABS
ORAL_TABLET | ORAL | Status: DC
Start: 2014-06-12 — End: 2014-08-08

## 2014-06-12 MED ORDER — METHOCARBAMOL 750 MG PO TABS: ORAL_TABLET | ORAL | Status: DC

## 2014-06-12 NOTE — Patient Instructions (Addendum)
F/u in 4 month, call if you need me before  You are treated today for acute sinusitis (ethmoid) important to take all antibiotics prescribed  Return for flu vaccine in next 14  to 10 days  Fasting labs 11/22 or after  Amlodipine, clonidineed and methocarbamol are refill  Xray if chest to eval left anterior rib pain s/p recent fall is ordered pls go today  Call Dr Ace Gins re back pain  Thankful that mental health support has improved  Antibiotic is prescribed for 7 days for sinus  BP is excellent

## 2014-06-12 NOTE — Progress Notes (Signed)
   Subjective:    Patient ID: Marc [name], male    DOB: 09-Jul-1956, 58 y.o.   MRN: [id number]  HPI The PT is here for follow up and re-evaluation of chronic medical conditions, medication management and review of any available recent lab and radiology data.  Preventive health is updated, specifically  Cancer screening and Immunization.   Questions or concerns regarding consultations or procedures which the PT has had in the interim are  addressed. The PT denies any adverse reactions to current medications since the last visit.  5 day h/o increased sinus pressure , thick post nasal drainage and intermittent chills. Still bothered by RUE pain , and being treated by orthopedics for RUE truma for 1 year C/o increased back pain despite stimulator , will contact his pain management Md, currently getting pain med from ortho, states Doc is aware of his h/ narcotic abuse Mental health improved with new counselor and psychiatrist, has ongoing family stress     Review of Systems See HPI Denies  Denies chest congestion, productive cough or wheezing. Denies chest pains, palpitations and leg swelling Denies abdominal pain, nausea, vomiting,diarrhea or constipation.   Denies dysuria, frequency, hesitancy or incontinence.  Denies headaches, seizures,  Denies uncontrolled  depression, anxiety or insomnia. Denies skin break down or rash.        Objective:   Physical Exam BP 126/82 mmHg  Pulse 100  Resp 18  Ht 6' (1.829 m)  Wt 168 lb 0.6 oz (76.222 kg)  BMI 22.79 kg/m2  SpO2 98% Patient alert and oriented and in no cardiopulmonary distress.  HEENT: No facial asymmetry, EOMI,   oropharynx pink and moist.  Neck supple no JVD, bilateral anterior cervical adenitis, and bilateral ethmoid tenderness, nasal mucosa erythematous and edematous.  Chest: Clear to auscultation bilaterally.  CVS: S1, S2 no murmurs, no S3.Regular rate.  ABD: Soft non tender.   Ext: No edema  [id number]  ROM  spine,  And RUE which is in protective wrap  Skin: Intact, no ulcerations or rash noted.  Psych: Good eye contact, flat  affect. Memory intact not anxious or depressed appearing.  CNS: CN 2-12 intact, power,  normal throughout.no focal deficits noted.        Assessment & Plan:  Essential hypertension Controlled, no change in medication DASH diet and commitment to daily physical activity for a minimum of 30 minutes discussed and encouraged, as a part of hypertension management. The importance of attaining a healthy weight is also discussed.   Ethmoid sinusitis Antibiotic course prescribed, pt to return for flu vaccine on completiton  GERD Controlled, no change in medication Followed by GI  Back pain with radiation Currently uncontrolled with worsening of pain, will sched f/u with physiatry  Anxiety and depression Improved control with new providers.'Treated by mental health.   Prediabetes Patient educated about the importance of limiting  Carbohydrate intake , the need to commit to daily physical activity for a minimum of 30 minutes , and to commit weight loss. The fact that changes in all these areas will reduce or eliminate all together the development of diabetes is stressed.   Updated lab needed at/ before next visit.

## 2014-06-15 ENCOUNTER — Ambulatory Visit (HOSPITAL_COMMUNITY)
Admission: RE | Admit: 2014-06-15 | Discharge: 2014-06-15 | Disposition: A | Discharge status: Home / Self Care | Payer: PRIVATE HEALTH INSURANCE | Source: Ambulatory Visit | Attending: Family Medicine | Admitting: Family Medicine

## 2014-06-15 DIAGNOSIS — R05 Cough: Secondary | ICD-10-CM | POA: Diagnosis not present

## 2014-06-15 DIAGNOSIS — R059 Cough, unspecified: Secondary | ICD-10-CM

## 2014-06-15 DIAGNOSIS — R079 Chest pain, unspecified: Secondary | ICD-10-CM | POA: Insufficient documentation

## 2014-06-17 DIAGNOSIS — J322 Chronic ethmoidal sinusitis: Secondary | ICD-10-CM | POA: Insufficient documentation

## 2014-06-17 NOTE — Assessment & Plan Note (Signed)
Improved control with new providers.'Treated by mental health.

## 2014-06-17 NOTE — Assessment & Plan Note (Signed)
Antibiotic course prescribed, pt to return for flu vaccine on completiton

## 2014-06-17 NOTE — Assessment & Plan Note (Signed)
Controlled, no change in medication Followed by GI 

## 2014-06-17 NOTE — Assessment & Plan Note (Signed)
Currently uncontrolled with worsening of pain, will sched f/u with physiatry

## 2014-06-17 NOTE — Assessment & Plan Note (Signed)
Controlled, no change in medication DASH diet and commitment to daily physical activity for a minimum of 30 minutes discussed and encouraged, as a part of hypertension management. The importance of attaining a healthy weight is also discussed.  

## 2014-06-17 NOTE — Assessment & Plan Note (Signed)
Patient educated about the importance of limiting  Carbohydrate intake , the need to commit to daily physical activity for a minimum of 30 minutes , and to commit weight loss. The fact that changes in all these areas will reduce or eliminate all together the development of diabetes is stressed.   Updated lab needed at/ before next visit.  

## 2014-06-27 ENCOUNTER — Ambulatory Visit (HOSPITAL_COMMUNITY): Payer: Self-pay | Admitting: Psychiatry

## 2014-07-11 ENCOUNTER — Ambulatory Visit (HOSPITAL_COMMUNITY): Payer: Self-pay | Admitting: Psychiatry

## 2014-07-25 ENCOUNTER — Encounter (HOSPITAL_COMMUNITY): Payer: Self-pay | Admitting: Psychiatry

## 2014-07-25 ENCOUNTER — Ambulatory Visit (HOSPITAL_COMMUNITY): Payer: Self-pay | Admitting: Psychiatry

## 2014-07-25 ENCOUNTER — Ambulatory Visit (INDEPENDENT_AMBULATORY_CARE_PROVIDER_SITE_OTHER): Payer: Medicare HMO | Admitting: Psychiatry

## 2014-07-25 VITALS — BP 97/64 | HR 76 | Ht 72.0 in | Wt 152.6 lb

## 2014-07-25 DIAGNOSIS — F32A Depression, unspecified: Secondary | ICD-10-CM

## 2014-07-25 DIAGNOSIS — F332 Major depressive disorder, recurrent severe without psychotic features: Secondary | ICD-10-CM

## 2014-07-25 DIAGNOSIS — F331 Major depressive disorder, recurrent, moderate: Secondary | ICD-10-CM

## 2014-07-25 DIAGNOSIS — F329 Major depressive disorder, single episode, unspecified: Secondary | ICD-10-CM

## 2014-07-25 DIAGNOSIS — F1920 Other psychoactive substance dependence, uncomplicated: Secondary | ICD-10-CM

## 2014-07-25 MED ORDER — CLONAZEPAM 0.5 MG PO TABS: 0.5000 mg | ORAL_TABLET | Freq: Three times a day (TID) | ORAL | Status: DC | PRN

## 2014-07-25 MED ORDER — GABAPENTIN 400 MG PO CAPS: 400.0000 mg | ORAL_CAPSULE | Freq: Three times a day (TID) | ORAL | Status: DC

## 2014-07-25 MED ORDER — FLUOXETINE HCL 20 MG PO CAPS: 20.0000 mg | ORAL_CAPSULE | Freq: Every day | ORAL | Status: DC

## 2014-07-25 MED ORDER — QUETIAPINE FUMARATE 50 MG PO TABS: ORAL_TABLET | ORAL | Status: DC

## 2014-07-25 NOTE — Progress Notes (Signed)
Patient ID: Marc [name], male   DOB: 11/14/1955, 58 y.o.   MRN: [id number] Patient ID: Marc [name], male   DOB: [birth year], 58 y.o.   MRN: [id number] Patient ID: Marc [name], male   DOB: 1955/09/29, 58 y.o.   MRN: [id number] Patient ID: Marc [name], male   DOB: 1956/03/10, 58 y.o.   MRN: [id number] Patient ID: Marc [name], male   DOB: 1956/07/11, 58 y.o.   MRN: [id number] Patient ID: Marc [name], male   DOB: 05/01/1956, 58 y.o.   MRN: [id number] [facility] [id number] Progress Note Marc [name] MRN: [id number] DOB: Feb 19, 1956 Age: 58 y.o.  Date: 07/25/2014 Start Time: 1:15 PM End Time: 1:35 PM  Chief Complaint: Chief Complaint  Patient presents with  . Depression  . Anxiety  . 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 He has one son age 63 years old and lives in [place]  The patient states he has a history depression that started around 2009. Prior to that year he been treated with ribavirin to treat hepatitis C which made him very depressed. He's also had a number of deaths in his family-both sisters have died as well as both parents in the last several years. His son is very close to him and he only talks to him periodically.  Currently the patient is struggling with medical problems including neck and back pain. He recently had a neurostimulator installed in his back which is helped to some degree. Lately he's been a bit more depressed because his aunt keeps calling and wanting to talk about all the dead relatives people he's been draining a lot about people who have died. He's not been seeing his counselor regularly and I think it would be helpful for him to get back into counseling. He does have friends and he walks his dog 2 miles a day. He admits to passive suicidal ideation but would never hurt  himself.  The patient returns for followup after 3 months. He states that around November 25 [facility] [place]. He admits that he was overusing his pain medicine he wanted to get off it. While there he was playing with the dog that live near the property and some the other patients claimed he was trying to be sexual with the dog and called the police. He was placed in jail for 21 days. While they're most of his medicines were stopped and he went into severe withdrawal. He had to go to emergency room and get medication for narcotic withdrawal. A friend here bails him out and he just got back yesterday. He is very weak and tired and has lost 17 pounds. He is just now getting back on his medicine. He states that prayer was only thing that got him through it.  .  Current psychiatric medication Prozac 20 mg in AM Seroquel 50 mg 3 times a day and 2 at bedtime   Klonopin 0.5 mg 1-2 tablet  Vitamin D 50,000 IU weekly prescribed by PCP  Past psychiatric history Patient has history of depression for many years. He has taken in the past Wellbutrin Cymbalta and Celexa. He denies any history of suicidal attempt.  Alcohol and substance use history Patient has significant history of alcohol with multiple rehabilitation and detox treatment.  He has history of DWI. He also had history of taking recreational drugs and he was in the [employer]. He has history of intravenous drug use that cause hepatitis C. Patient also had history of using pain medication.  Allergies: Allergies  Allergen Reactions  . Levofloxacin Nausea Only and Other (See Comments)    "Creepy" feeling, skin didn't feel right, sort of itchy.  Baker Pierini [Lubiprostone]     MADE HIM FEEL STIFF  . Sulfa Antibiotics    Medical History: Past Medical History  Diagnosis Date  . Hepatitis C reactive     LIVER BX 2008-CHRONIC ACTIVE HEPATITIS  . Back problem   . GERD (gastroesophageal reflux disease)   . IBS (irritable  bowel syndrome)   . HTN (hypertension)   . Hypercholesterolemia   . Hx of adenomatous colonic polyps 2008    due for surveillance 2018  . Knee pain   . Substance abuse     narcotic addiction  . Thyroiditis     History of  . Normal echocardiogram 05/25/2011    EF 55%, mild TR  . Normal cardiac stress test 05/25/2011    [facility] [place]  . Depression   . Hepatitis C 1979  Cervical fusion Neck surgery Neck pain Hepatitis C Hypertension Benign tremors High blood pressure He sees Dr. Moshe Cipro and Dr. Paulita Cradle for pain management.  Surgical History: Past Surgical History  Procedure Laterality Date  . Appendectomy    . Tonsillectomy    . Carpal tunnel release      rt  . Right ring finger    . Spinal stenosis, had screws put in neck  04/11/2009    DR KRITZER  . Upper gastrointestinal endoscopy  2008 [facility] ABD PAIN WEIGHT LOSS d100 v8 phen 12.5    NL  . Colonoscopy  2008 [facility] ARS D100 V8 PHEN 12.5    2 SIMPLE ADENOMAS (< 1 CM)  . Knee arthroscopy    . Ulnar nerve transposition    . Esophagogastroduodenoscopy  04/26/2012    [facility]: Non-erosive gastritis (inflammation) was found in the gastric antrum but no H.pylori; multiple biopsies (duodenal bx negative for Celiac)/ The mucosa of the esophagus appeared normal  . Eus  09/08/2012    Procedure: UPPER ENDOSCOPIC ULTRASOUND (EUS) LINEAR;  Surgeon: Milus Banister, MD;  Location: [facility];  Service: Endoscopy;  Laterality: N/A;  . Neurostimulator implant    . Wrist surgery Right jan 2015    Dr. Edmonia Lynch  . Hardware removal Right 02/12/2014    Procedure: HARDWARE REMOVAL;  Surgeon: Jolyn Nap, MD;  Location: [facility];  Service: Orthopedics;  Laterality: Right;  . Ulna osteotomy Right 02/12/2014    Procedure: RIGHT ULNAR SHORTENING AND OSTEOTOMY ;  Surgeon: Jolyn Nap, MD;  Location: [facility];  Service: Orthopedics;  Laterality: Right;   Family History: family history includes  ADD / ADHD in his cousin; Alcohol abuse in his cousin, father, and paternal uncle; Anxiety disorder in his cousin; Bipolar disorder in his cousin; Cancer in his mother and sister; Dementia in his paternal aunt, paternal grandmother, and paternal uncle; Depression in his cousin; Emotional abuse in his sister and sister; Heart defect in an other family member; Heart failure in his father; OCD in his cousin; Paranoid behavior in his cousin; Stroke in his father and sister. There is no history of Colon cancer, Colon polyps, Drug abuse, Schizophrenia, Seizures, Sexual abuse, or Physical abuse. Reviewed again in the office and  no changes were noted except the new pain issues  Psychosocial history Patient was born and raised in [place].  He married twice. 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 Patient has multiple family member who has died in past few years. His father died in 12/09/2007 and mother died in 12/08/2008  and 2 of his sister also died.  Patient lives by himself.  Mental status examination Patient is casually dressed and groomed.. his right arm is in a soft cast  He maintained fair eye contact. He described his mood as anxious and his affect is mildly constricted.  His speech is slow but clear. His thought process is slow but logical linear and goal-directed.  He has mild tremors which are chronic .  He denies any active or passive suicidal thinking or homicidal thinking. He denies any auditory or visual hallucination. He is alert and oriented x3.  His attention and concentration is distracted. There were no paranoia or delusions present at this time. His insight judgment and impulse control is okay.  Diagnosis Axis I Maj. depressive disorder, polysubstance dependence by history Axis II deferred Axis III see medical history Axis IV mild to moderate Axis V 65-70  Plan/Discussion: I took his vitals.  I reviewed CC, tobacco/med/surg Hx, meds effects/ side  effects, problem list, therapies and responses as well as current situation/symptoms discussed options. He will restart his current medications and get back into therapy as soon as possible. He is totally off pain medication now and only uses a low dose of clonazepam which she has never abused. He'll return to see me in 4 weeks See orders and pt instructions for more details.  Medical Decision Making Problem Points:  Established problem, stable/improving (1), Review of last therapy session (1) and Review of psycho-social stressors (1) Data Points:  Review or order clinical lab tests (1) Review of medication regiment & side effects (2)  I certify that outpatient services furnished can reasonably be expected to improve the patient's condition.   [name], MD

## 2014-08-07 ENCOUNTER — Other Ambulatory Visit: Payer: Self-pay | Admitting: Family Medicine

## 2014-08-07 LAB — COMPREHENSIVE METABOLIC PANEL
ALBUMIN: 3.7 g/dL (ref 3.5–5.2)
ALT: 46 U/L (ref 0–53)
AST: 31 U/L (ref 0–37)
Alkaline Phosphatase: 72 U/L (ref 39–117)
BUN: 9 mg/dL (ref 6–23)
CALCIUM: 9.1 mg/dL (ref 8.4–10.5)
CHLORIDE: 105 meq/L (ref 96–112)
CO2: 26 meq/L (ref 19–32)
CREATININE: 0.99 mg/dL (ref 0.50–1.35)
Glucose, Bld: 112 mg/dL — ABNORMAL HIGH (ref 70–99)
POTASSIUM: 3.9 meq/L (ref 3.5–5.3)
Sodium: 140 mEq/L (ref 135–145)
TOTAL PROTEIN: 6.2 g/dL (ref 6.0–8.3)
Total Bilirubin: 0.4 mg/dL (ref 0.2–1.2)

## 2014-08-07 LAB — CBC
HCT: 34.7 % — ABNORMAL LOW (ref 39.0–52.0)
HEMOGLOBIN: 11.9 g/dL — AB (ref 13.0–17.0)
MCH: 32.1 pg (ref 26.0–34.0)
MCHC: 34.3 g/dL (ref 30.0–36.0)
MCV: 93.5 fL (ref 78.0–100.0)
Platelets: 128 10*3/uL — ABNORMAL LOW (ref 150–400)
RBC: 3.71 MIL/uL — ABNORMAL LOW (ref 4.22–5.81)
RDW: 13.3 % (ref 11.5–15.5)
WBC: 6 10*3/uL (ref 4.0–10.5)

## 2014-08-07 LAB — LIPID PANEL
Cholesterol: 153 mg/dL (ref 0–200)
HDL: 36 mg/dL — ABNORMAL LOW (ref >39)
LDL Cholesterol: 103 mg/dL — ABNORMAL HIGH (ref 0–99)
Total CHOL/HDL Ratio: 4.3 Ratio
Triglycerides: 71 mg/dL (ref <150)
VLDL: 14 mg/dL (ref 0–40)

## 2014-08-07 LAB — TSH: TSH: 2.032 u[IU]/mL (ref 0.350–4.500)

## 2014-08-08 ENCOUNTER — Encounter: Payer: Self-pay | Admitting: Family Medicine

## 2014-08-08 ENCOUNTER — Ambulatory Visit (INDEPENDENT_AMBULATORY_CARE_PROVIDER_SITE_OTHER): Payer: Medicare HMO | Admitting: Family Medicine

## 2014-08-08 VITALS — BP 120/78 | HR 101 | Temp 98.9°F | Resp 16 | Ht 70.0 in | Wt 170.1 lb

## 2014-08-08 DIAGNOSIS — J209 Acute bronchitis, unspecified: Secondary | ICD-10-CM

## 2014-08-08 DIAGNOSIS — I10 Essential (primary) hypertension: Secondary | ICD-10-CM

## 2014-08-08 DIAGNOSIS — R7309 Other abnormal glucose: Secondary | ICD-10-CM

## 2014-08-08 DIAGNOSIS — F418 Other specified anxiety disorders: Secondary | ICD-10-CM

## 2014-08-08 DIAGNOSIS — R7303 Prediabetes: Secondary | ICD-10-CM

## 2014-08-08 DIAGNOSIS — F419 Anxiety disorder, unspecified: Secondary | ICD-10-CM

## 2014-08-08 DIAGNOSIS — F329 Major depressive disorder, single episode, unspecified: Secondary | ICD-10-CM

## 2014-08-08 LAB — HEMOGLOBIN A1C
HEMOGLOBIN A1C: 5.9 % — AB (ref <5.7)
Mean Plasma Glucose: 123 mg/dL — ABNORMAL HIGH (ref <117)

## 2014-08-08 LAB — PSA: PSA: 0.37 ng/mL (ref <=4.00)

## 2014-08-08 MED ORDER — CLONIDINE HCL 0.2 MG PO TABS: 0.2000 mg | ORAL_TABLET | Freq: Two times a day (BID) | ORAL | Status: DC

## 2014-08-08 MED ORDER — BENZONATATE 100 MG PO CAPS: 100.0000 mg | ORAL_CAPSULE | Freq: Two times a day (BID) | ORAL | Status: DC

## 2014-08-08 MED ORDER — PENICILLIN V POTASSIUM 500 MG PO TABS: 500.0000 mg | ORAL_TABLET | Freq: Three times a day (TID) | ORAL | Status: DC

## 2014-08-08 NOTE — Progress Notes (Signed)
   Subjective:    Patient ID: Marc [name], male    DOB: 07/25/1956, 58 y.o.   MRN: [id number]  HPI The PT is here for follow up and re-evaluation of chronic medical conditions, medication management and review of any available recent lab and radiology data.  Preventive health is updated, specifically  Cancer screening and Immunization. Still has limited movement in RUE wearing a sling, has not had colonoscopy states unable  C/o debilitating lower extremity swelling, likely dye to anlodipine wll change med, swelling resolves overnight 2 week h/o increased cough and chest congestion intermittent chills, sputum is yellow. Denies ear pain or sinus symptoms      Review of Systems See HPI Denies chest pains, palpitations and leg swelling Denies abdominal pain, nausea, vomiting,diarrhea or constipation.   Denies dysuria, frequency, hesitancy or incontinence. Denies joint pain, swelling and limitation in mobility. Denies headaches, seizures, numbness, or tingling. Denies uncontrolled depression, anxiety or insomnia. Denies skin break down or rash.        Objective:   Physical Exam  BP 120/78 mmHg  Pulse 101  Temp(Src) 98.9 F (37.2 C) (Oral)  Resp 16  Ht 5\' 10"  (1.778 m)  Wt 170 lb 1.9 oz (77.166 kg)  BMI 24.41 kg/m2  SpO2 96% Patient alert and oriented and in no cardiopulmonary distress.  HEENT: No facial asymmetry, EOMI,   oropharynx pink and moist.  Neck supple no JVD, no mass. Sinuses non tender, TM clear bilaterally Chest: adequate though reduced air entry, bilateral crackles, npo wheezes.  CVS: S1, S2 no murmurs, no S3.Regular rate.  ABD: Soft non tender.   Ext: No edema  MS: Adequate ROM spine, shoulders, hips and knees.  Skin: Intact, no ulcerations or rash noted.  Psych: Good eye contact, normal affect. Memory intact not anxious or depressed appearing.  CNS: CN 2-12 intact, power,  normal throughout.no focal deficits noted.       Assessment & Plan:    Essential hypertension Reports leg swelling with amlodipine, will increase dose of clonidine and d/c the norvasc DASH diet and commitment to daily physical activity for a minimum of 30 minutes discussed and encouraged, as a part of hypertension management. The importance of attaining a healthy weight is also discussed. F/u in 2 months  Prediabetes Worsened Patient educated about the importance of limiting  Carbohydrate intake , the need to commit to daily physical activity for a minimum of 30 minutes , and to commit weight loss. The fact that changes in all these areas will reduce or eliminate all together the development of diabetes is stressed.     Anxiety and depression Im[proved, treated by psychiatry  Acute bronchitis Decongestant and anti biotic prescribed

## 2014-08-08 NOTE — Patient Instructions (Addendum)
F/u in 2 month, call if you need me before  You are treated for acute bronchitis, tessalon perles and penicillin are prescribed    Stop amlodipine  Due to leg swelling and change dose of clonidine to 0.2 mg one twice daily   Labs show elevated blood sugar and bad cholesterol, also low "good cholesterol" please reduce fried and fatty foods , and watch sugar and carb intake and commit to daily exercise for a minimum of 30 minutes

## 2014-08-09 ENCOUNTER — Ambulatory Visit: Payer: Medicare HMO | Admitting: Family Medicine

## 2014-08-11 ENCOUNTER — Telehealth: Payer: Self-pay | Admitting: Family Medicine

## 2014-08-11 DIAGNOSIS — J209 Acute bronchitis, unspecified: Secondary | ICD-10-CM | POA: Insufficient documentation

## 2014-08-11 NOTE — Assessment & Plan Note (Signed)
Reports leg swelling with amlodipine, will increase dose of clonidine and d/c the norvasc DASH diet and commitment to daily physical activity for a minimum of 30 minutes discussed and encouraged, as a part of hypertension management. The importance of attaining a healthy weight is also discussed. F/u in 2 months

## 2014-08-11 NOTE — Assessment & Plan Note (Signed)
Worsened Patient educated about the importance of limiting  Carbohydrate intake , the need to commit to daily physical activity for a minimum of 30 minutes , and to commit weight loss. The fact that changes in all these areas will reduce or eliminate all together the development of diabetes is stressed.

## 2014-08-11 NOTE — Assessment & Plan Note (Signed)
Decongestant and antibiotic prescribed 

## 2014-08-11 NOTE — Telephone Encounter (Signed)
Pls contact pt. He is scheduled for 4 month f/u with me Needs nurse visit for BP check in 3 to 4 weeks, since I changed his BP meds, should  Also get flu vaccine at that time, he was sick during recent visit and still needs one  ?? pls ask

## 2014-08-11 NOTE — Assessment & Plan Note (Signed)
Im[proved, treated by psychiatry

## 2014-08-13 NOTE — Telephone Encounter (Signed)
lmom to call back 

## 2014-08-23 ENCOUNTER — Ambulatory Visit (HOSPITAL_COMMUNITY): Payer: Self-pay | Admitting: Psychiatry

## 2014-08-28 ENCOUNTER — Ambulatory Visit (HOSPITAL_COMMUNITY): Payer: Self-pay | Admitting: Psychiatry

## 2014-08-28 ENCOUNTER — Telehealth: Payer: Self-pay

## 2014-08-28 ENCOUNTER — Ambulatory Visit (INDEPENDENT_AMBULATORY_CARE_PROVIDER_SITE_OTHER): Payer: Medicare HMO

## 2014-08-28 VITALS — BP 114/72

## 2014-08-28 DIAGNOSIS — Z23 Encounter for immunization: Secondary | ICD-10-CM

## 2014-08-28 DIAGNOSIS — I10 Essential (primary) hypertension: Secondary | ICD-10-CM

## 2014-08-28 NOTE — Telephone Encounter (Signed)
States the PA at Dr [facility] office quit and he can't get a refill of his pain medication until he goes for an OV on jan 28th. Wants to know if you will give him enough to last until his appt there. States he gets hydrocodone 7.5-325 tid. Please advise. Will print registry info

## 2014-08-28 NOTE — Telephone Encounter (Signed)
I faxed the office and they are going to give him a sooner appt per patient

## 2014-08-28 NOTE — Telephone Encounter (Signed)
Please lT HIM KNOW THAT [facility] IS RESPONSIBLE FOR HIS PAIN MEEICATION, SHE KNOW IF AND WHEN HE WILL NEED REFILLS AND WILL FILL THEm  i WILL NOT PRESCRIBE PAIN MEDICATION FOR HIM  pLS AL;LSO SEND THE REGISTRY INFO TO [facility]S OFFICE , CALL NURSE DIRECTLY BEFORE FAXING TO ENSURE THAT [facility] SEES THIS INFO

## 2014-08-28 NOTE — Progress Notes (Signed)
Advised BP was normal and to continue on the same meds

## 2014-08-30 ENCOUNTER — Telehealth: Payer: Self-pay | Admitting: *Deleted

## 2014-08-30 ENCOUNTER — Ambulatory Visit (HOSPITAL_COMMUNITY): Payer: Self-pay | Admitting: Psychiatry

## 2014-08-30 MED ORDER — OSELTAMIVIR PHOSPHATE 75 MG PO CAPS: 75.0000 mg | ORAL_CAPSULE | Freq: Two times a day (BID) | ORAL | Status: DC

## 2014-08-30 NOTE — Telephone Encounter (Signed)
Called and left message for patient to return call.  

## 2014-08-30 NOTE — Telephone Encounter (Signed)
Patient c/o body aches, chills, headache and cough x 2 days.  Tamiflu sent to pharmacy .   Acute onset of fever Yes.  y Headache Yes.  y Body ache Yes.  y Fatigue Yes.  y Non productive cough Yes.  y Sore throat Yes.   Nasal discharge Yes.    Onset between 1-4 days of possible exposure Yes.  y  Recommendation:  Fluid, rest, Tylenol for control of fever  Expect 5 days before feeling better and not so contagious and generally better in 7-10 days.  Standard treatment Tama flu 75 mg 1 tablet twice daily times 5 days  Please call office if symptoms do not improve or worsen in 5 days after beginning treatment.

## 2014-08-30 NOTE — Telephone Encounter (Signed)
Pt called [facility] stating he has just come off of chronic bronchitis and the day before yesterday he got a flu shot, and he has not been feeling a 100% pt would like to talk to the nurse because yesterday pt had aches and headaches and not feeling well, pt is concerned if he has symptoms of the flu bug after getting the shot. Please advise

## 2014-09-03 ENCOUNTER — Encounter (HOSPITAL_COMMUNITY): Payer: Self-pay | Admitting: *Deleted

## 2014-09-03 ENCOUNTER — Telehealth (HOSPITAL_COMMUNITY): Payer: Self-pay | Admitting: *Deleted

## 2014-09-03 NOTE — Progress Notes (Signed)
[phone] Prior Auth  Quetiapine 50 mg  Approved from 09-03-2014 until 08-10-2015 Approval number is [id number]

## 2014-09-03 NOTE — Telephone Encounter (Signed)
Opened in Error.

## 2014-09-10 ENCOUNTER — Ambulatory Visit (INDEPENDENT_AMBULATORY_CARE_PROVIDER_SITE_OTHER): Payer: Medicare HMO | Admitting: Psychiatry

## 2014-09-10 ENCOUNTER — Encounter (HOSPITAL_COMMUNITY): Payer: Self-pay | Admitting: Psychiatry

## 2014-09-10 VITALS — BP 104/71 | HR 83 | Ht 70.0 in | Wt 176.0 lb

## 2014-09-10 DIAGNOSIS — F331 Major depressive disorder, recurrent, moderate: Secondary | ICD-10-CM

## 2014-09-10 DIAGNOSIS — F1920 Other psychoactive substance dependence, uncomplicated: Secondary | ICD-10-CM

## 2014-09-10 DIAGNOSIS — F329 Major depressive disorder, single episode, unspecified: Secondary | ICD-10-CM

## 2014-09-10 MED ORDER — GABAPENTIN 400 MG PO CAPS: 400.0000 mg | ORAL_CAPSULE | Freq: Three times a day (TID) | ORAL | Status: DC

## 2014-09-10 MED ORDER — CLONAZEPAM 0.5 MG PO TABS: 0.5000 mg | ORAL_TABLET | Freq: Three times a day (TID) | ORAL | Status: DC | PRN

## 2014-09-10 MED ORDER — QUETIAPINE FUMARATE 50 MG PO TABS: ORAL_TABLET | ORAL | Status: DC

## 2014-09-10 MED ORDER — FLUOXETINE HCL 40 MG PO CAPS: 40.0000 mg | ORAL_CAPSULE | Freq: Every day | ORAL | Status: DC

## 2014-09-10 NOTE — Progress Notes (Signed)
Patient ID: MARKOS THEIL, male   DOB: [birth year], 59 y.o.   MRN: [id number] Patient ID: JUDAH CARCHI, male   DOB: 1956-06-13, 59 y.o.   MRN: [id number] Patient ID: CORIN TILLY, male   DOB: March 10, 1956, 59 y.o.   MRN: [id number] Patient ID: RETT STEHLIK, male   DOB: [birth year], 59 y.o.   MRN: [id number] Patient ID: RAYLIN WINER, male   DOB: 01-09-1956, 59 y.o.   MRN: [id number] Patient ID: BRADON FESTER, male   DOB: 07/15/1956, 59 y.o.   MRN: [id number] Patient ID: BERISH BOHMAN, male   DOB: [birth year], 59 y.o.   MRN: [id number] [facility] [address] Progress Note ALTARIQ GOODALL MRN: [id number] DOB: Feb 03, 1956 Age: 59 y.o.  Date: 09/10/2014 Start Time: 1:15 PM End Time: 1:35 PM  Chief Complaint: Chief Complaint  Patient presents with  . Depression  . Anxiety  . 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 He has one son age 75 years old and lives in [place]  The patient states he has a history depression that started around 2009. Prior to that year he been treated with ribavirin to treat hepatitis C which made him very depressed. He's also had a number of deaths in his family-both sisters have died as well as both parents in the last several years. His son is very close to him and he only talks to him periodically.  Currently the patient is struggling with medical problems including neck and back pain. He recently had a neurostimulator installed in his back which is helped to some degree. Lately he's been a bit more depressed because his aunt keeps calling and wanting to talk about all the dead relatives people he's been draining a lot about people who have died. He's not been seeing his counselor regularly and I think it would be helpful for him to get back into counseling. He does have friends and he walks his dog 2  miles a day. He admits to passive suicidal ideation but would never hurt himself.  The patient returns for followup after one month. He is still getting over the time he got put in jail last month for 21 days. His discharge got reduced to trespassing and he doesn't have any further legal proceedings. He still feels traumatized by this and has nightmares. He's been more depressed lately. He is now going to pain management. I suggested we increase his Prozac due to his worsening depression he agrees. He denies suicidal ideation.  .  Current psychiatric medication Prozac 20 mg in AM Seroquel 50 mg 3 times a day and 2 at bedtime   Klonopin 0.5 mg 1-2 tablet  Vitamin D 50,000 IU weekly prescribed by PCP  Past psychiatric history Patient has history of depression for many years. He has taken in the past Wellbutrin Cymbalta and Celexa. He denies any history of suicidal attempt.  Alcohol and substance use history Patient has significant history of alcohol with multiple rehabilitation and detox treatment. He has history of DWI. He also had history of taking recreational drugs and he was in the [employer]. He has history of intravenous drug use that cause hepatitis C. Patient also had history of using pain medication.  Allergies: Allergies  Allergen Reactions  . Levofloxacin Nausea Only and Other (See Comments)    "  Creepy" feeling, skin didn't feel right, sort of itchy.  Baker Pierini [Lubiprostone]     MADE HIM FEEL STIFF  . Sulfa Antibiotics    Medical History: Past Medical History  Diagnosis Date  . Hepatitis C reactive     LIVER BX 2008-CHRONIC ACTIVE HEPATITIS  . Back problem   . GERD (gastroesophageal reflux disease)   . IBS (irritable bowel syndrome)   . HTN (hypertension)   . Hypercholesterolemia   . Hx of adenomatous colonic polyps 11/27/06    due for surveillance 11/26/2016  . Knee pain   . Substance abuse     narcotic addiction  . Thyroiditis     History of  . Normal echocardiogram  05/25/2011    EF 55%, mild TR  . Normal cardiac stress test 05/25/2011    [facility] [place]  . Depression   . Hepatitis C 1979  Cervical fusion Neck surgery Neck pain Hepatitis C Hypertension Benign tremors High blood pressure He sees Dr. Moshe Cipro and Dr. Paulita Cradle for pain management.  Surgical History: Past Surgical History  Procedure Laterality Date  . Appendectomy    . Tonsillectomy    . Carpal tunnel release      rt  . Right ring finger    . Spinal stenosis, had screws put in neck  04/11/2009    DR KRITZER  . Upper gastrointestinal endoscopy  2006-11-27 [facility] ABD PAIN WEIGHT LOSS d100 v8 phen 12.5    NL  . Colonoscopy  11-27-2006 [facility] ARS D100 V8 PHEN 12.5    2 SIMPLE ADENOMAS (< 1 CM)  . Knee arthroscopy    . Ulnar nerve transposition    . Esophagogastroduodenoscopy  04/26/2012    [facility]: Non-erosive gastritis (inflammation) was found in the gastric antrum but no H.pylori; multiple biopsies (duodenal bx negative for Celiac)/ The mucosa of the esophagus appeared normal  . Eus  09/08/2012    Procedure: UPPER ENDOSCOPIC ULTRASOUND (EUS) LINEAR;  Surgeon: Milus Banister, MD;  Location: [facility];  Service: Endoscopy;  Laterality: N/A;  . Neurostimulator implant    . Wrist surgery Right jan 2015    Dr. Edmonia Lynch  . Hardware removal Right 02/12/2014    Procedure: HARDWARE REMOVAL;  Surgeon: Jolyn Nap, MD;  Location: [place];  Service: Orthopedics;  Laterality: Right;  . Ulna osteotomy Right 02/12/2014    Procedure: RIGHT ULNAR SHORTENING AND OSTEOTOMY ;  Surgeon: Jolyn Nap, MD;  Location: [place];  Service: Orthopedics;  Laterality: Right;   Family History: family history includes ADD / ADHD in his cousin; Alcohol abuse in his cousin, father, and paternal uncle; Anxiety disorder in his cousin; Bipolar disorder in his cousin; Cancer in his mother and sister; Dementia in his paternal aunt, paternal grandmother, and paternal uncle;  Depression in his cousin; Emotional abuse in his sister and sister; Heart defect in an other family member; Heart failure in his father; OCD in his cousin; Paranoid behavior in his cousin; Stroke in his father and sister. There is no history of Colon cancer, Colon polyps, Drug abuse, Schizophrenia, Seizures, Sexual abuse, or Physical abuse. Reviewed again in the office and no changes were noted except the new pain issues  Psychosocial history Patient was born and raised in [place].  He married twice. 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 Patient has multiple family member who has died in past few years. His father died in November 27, 2007  and mother died in 2010  and 2 of his sister also died.  Patient lives by himself.  Mental status examination Patient is casually dressed and groomed.. his right arm is no longer in a cast  He maintained fair eye contact. He described his mood as depressed and his affect isconstricted.  His speech is slow but clear. His thought process is slow but logical linear and goal-directed.  He has mild tremors which are chronic .  He denies any active or passive suicidal thinking or homicidal thinking. He denies any auditory or visual hallucination. He is alert and oriented x3.  His attention and concentration is distracted. There were no paranoia or delusions present at this time. His insight judgment and impulse control is okay.  Diagnosis Axis I Maj. depressive disorder, polysubstance dependence by history Axis II deferred Axis III see medical history Axis IV mild to moderate Axis V 65-70  Plan/Discussion: I took his vitals.  I reviewed CC, tobacco/med/surg Hx, meds effects/ side effects, problem list, therapies and responses as well as current situation/symptoms discussed options. He will restart his current medications and get back into therapy as soon as possible. He we'll continue all medications but increase Prozac to 40 mg  daily. He'll return to see me in 2 months See orders and pt instructions for more details.  Medical Decision Making Problem Points:  Established problem, stable/improving (1), Review of last therapy session (1) and Review of psycho-social stressors (1) Data Points:  Review or order clinical lab tests (1) Review of medication regiment & side effects (2)  I certify that outpatient services furnished can reasonably be expected to improve the patient's condition.   [name], MD

## 2014-09-20 ENCOUNTER — Telehealth (HOSPITAL_COMMUNITY): Payer: Self-pay | Admitting: *Deleted

## 2014-09-20 ENCOUNTER — Telehealth: Payer: Self-pay | Admitting: *Deleted

## 2014-09-20 ENCOUNTER — Other Ambulatory Visit (HOSPITAL_COMMUNITY): Payer: Self-pay | Admitting: Psychiatry

## 2014-09-20 MED ORDER — FLUOXETINE HCL 20 MG PO CAPS: 20.0000 mg | ORAL_CAPSULE | Freq: Every day | ORAL | Status: DC

## 2014-09-20 NOTE — Telephone Encounter (Signed)
I have sent a lower dose-20 mg into his pharmacy. Tell him to wait a day, then start back on loser dose

## 2014-09-20 NOTE — Telephone Encounter (Signed)
Pt called stating he is on medicare and methocarmabol 750 needs a piror Fannie Knee # [phone]. Please advise

## 2014-09-20 NOTE — Telephone Encounter (Signed)
Pt called stating he feels like after taking his Prozac, he feeling like he just drank 10 cups of coffee at one time. Per pt he just feel very hipper and can not settle down. Pt number is [phone]

## 2014-09-20 NOTE — Telephone Encounter (Signed)
Pt is aware and shows understanding 

## 2014-09-21 ENCOUNTER — Ambulatory Visit (HOSPITAL_COMMUNITY): Payer: Self-pay | Admitting: Psychiatry

## 2014-09-26 ENCOUNTER — Telehealth: Payer: Self-pay | Admitting: Family Medicine

## 2014-09-26 NOTE — Telephone Encounter (Signed)
i have faxed to [facility] already because they have not sent the PA either by fax or on cover my meds website

## 2014-09-26 NOTE — Telephone Encounter (Signed)
PA has been faxed back- can take up to 72 hours

## 2014-10-03 ENCOUNTER — Ambulatory Visit (INDEPENDENT_AMBULATORY_CARE_PROVIDER_SITE_OTHER): Payer: Medicare HMO | Admitting: Psychiatry

## 2014-10-03 DIAGNOSIS — F331 Major depressive disorder, recurrent, moderate: Secondary | ICD-10-CM

## 2014-10-03 NOTE — Patient Instructions (Signed)
Discussed orally 

## 2014-10-03 NOTE — Progress Notes (Signed)
   THERAPIST PROGRESS NOTE  Session Time: Wednesday 10/03/2014 2:10 PM - 3:10 PM  Participation Level: Active  Behavioral Response: CasualAlert/ Anxious,   Type of Therapy: Individual Therapy  Treatment Goals addressed:  Implement coping skills resulting in decreased anxiety  Interventions: CBT and Supportive  Summary: Marc [name] is a 59 y.o. male who initially was referred by Dr. Adele Schilder to improve coping and social skills as patient was experiencing sadness, depression, grief, and anger issues. The patient has a history of recurrent periods of depression along with a history of chemical dependency. He has had psychiatric hospitalization due to suicidal thoughts and has participated in inpatient treatment as well as outpatient treatment for chemical dependency. He completed the chemical dependency IOP program in [place] in December 2012.   Patient was last seen in October 2015. He reports being jailed in [place] from 07/04/2014 through 07/24/2014. Per his report he had been taking more than the prescribed amount of pain medication and using alcohol to manage pain after he hurt his back. He reports not wanting to do that anymore and checking himself into the [facility] [place] [facility] [place] to receive treatment. One evening while there, he petted and played with a dog that was on an adjacent property and reports the dog licked him in the face. He jokingly made some comment to a group of other patients about he and the dog pulled tongue. He reports three of the patients later told police they saw him have sexual contact with the dog. Patient was arrested and taken to jail. During that time, patient became very ill as he was not given any of his medications and was going through withdrawal. He eventually was taken to the hospital where he was treated and then taken back to jail. He eventually had a bond hearing and was released. He had support and contact from two of his friends.  He had to go for  hearing in January and the charges were dismissed as none of the witnesses showed up for court. Patient reports feeling helpless during the experience and says it was traumatic. Patient reports flashbacks, nightmares, and intrusive memories. He doesn't trust people and has fear of going to jail. He expresses frustration and anger as well as confusion as to why these three people lied. He also expresses anger at the way he was treated while in jail. He states the only way he was able to go through the experience was with the help of God and his friends. He is thankful and glad this experience led him to reconnecting with his son, his son's girlfriend, and his granddaughter. They maintain regular contact. Patient also is thankful for the reminder of people who do care about his welfare. He is maintaining  involvement with his friends.      Suicidal/Homicidal: No  Therapist Response: Therapist works with patient to identify and verbalize his feelings and normalize response to trauma   Plan: Return again in 4 weeks.  Diagnosis: Axis I: MDD, Recurrent, Moderate    Axis II: No diagnosis    BYNUM,PEGGY, LCSW 10/03/2014

## 2014-10-31 ENCOUNTER — Ambulatory Visit (INDEPENDENT_AMBULATORY_CARE_PROVIDER_SITE_OTHER): Payer: Medicare HMO | Admitting: Psychiatry

## 2014-10-31 DIAGNOSIS — F331 Major depressive disorder, recurrent, moderate: Secondary | ICD-10-CM

## 2014-10-31 NOTE — Patient Instructions (Signed)
Discussed orally 

## 2014-10-31 NOTE — Progress Notes (Signed)
    THERAPIST PROGRESS NOTE  Session Time: Wednesday 10/31/2014 1:10 PM - 2:00 PM  Participation Level: Active  Behavioral Response: CasualAlert/ Anxious,   Type of Therapy: Individual Therapy  Treatment Goals addressed:  Implement coping skills resulting in decreased anxiety  Interventions: CBT and Supportive  Summary: Marc [name] is a 59 y.o. male who initially was referred by Dr. Adele Schilder to improve coping and social skills as patient was experiencing sadness, depression, grief, and anger issues. The patient has a history of recurrent periods of depression along with a history of chemical dependency. He has had psychiatric hospitalization due to suicidal thoughts and has participated in inpatient treatment as well as outpatient treatment for chemical dependency. He completed the chemical dependency IOP program in [place] in December 2012.   Patient reports continued intrusive memories of his experience in jail. He doesn't express as much fear but reports the areas situations trigger his memories. He reports increased depressed mood, decreased interest in activities, poor concentration, sleep difficulty (wakes up several times per night) and decreased appetite in the past week. He denies suicidal ideations. He expresses frustration about increased health issues. He also reports frustration with several of his friends who keep asking to borrow money.   Suicidal/Homicidal: No  Therapist Response: Therapist works with patient to identify and verbalize his feelings , normalize response to trauma, provide psychoeducation regarding depression, identify ways to improve self-care and sleep hygiene, identify ways to set and maintain boundaries   Plan: Return again in 4 weeks.  Diagnosis: Axis I: MDD, Recurrent, Moderate    Axis II: No diagnosis    Laaibah Wartman, LCSW 10/31/2014

## 2014-11-08 ENCOUNTER — Encounter (HOSPITAL_COMMUNITY): Payer: Self-pay | Admitting: Psychiatry

## 2014-11-08 ENCOUNTER — Ambulatory Visit (INDEPENDENT_AMBULATORY_CARE_PROVIDER_SITE_OTHER): Payer: Medicare HMO | Admitting: Psychiatry

## 2014-11-08 VITALS — BP 125/73 | HR 86 | Ht 70.0 in | Wt 171.0 lb

## 2014-11-08 DIAGNOSIS — F1920 Other psychoactive substance dependence, uncomplicated: Secondary | ICD-10-CM | POA: Diagnosis not present

## 2014-11-08 DIAGNOSIS — F331 Major depressive disorder, recurrent, moderate: Secondary | ICD-10-CM

## 2014-11-08 MED ORDER — CLONIDINE HCL 0.2 MG PO TABS: 0.2000 mg | ORAL_TABLET | Freq: Two times a day (BID) | ORAL | Status: DC

## 2014-11-08 MED ORDER — CLONAZEPAM 0.5 MG PO TABS: 0.5000 mg | ORAL_TABLET | Freq: Three times a day (TID) | ORAL | Status: DC | PRN

## 2014-11-08 MED ORDER — FLUOXETINE HCL 20 MG PO CAPS: 20.0000 mg | ORAL_CAPSULE | Freq: Every day | ORAL | Status: DC

## 2014-11-08 MED ORDER — QUETIAPINE FUMARATE 50 MG PO TABS: ORAL_TABLET | ORAL | Status: DC

## 2014-11-08 NOTE — Progress Notes (Signed)
Patient ID: Marc [name], male   DOB: 05-25-1956, 59 y.o.   MRN: [id number] Patient ID: Marc [name], male   DOB: [birth year], 59 y.o.   MRN: [id number] Patient ID: Marc [name], male   DOB: [birth year], 59 y.o.   MRN: [id number] Patient ID: Marc [name], male   DOB: 02-22-1956, 59 y.o.   MRN: [id number] Patient ID: Marc [name], male   DOB: 1956/02/20, 59 y.o.   MRN: [id number] Patient ID: Marc [name], male   DOB: September 01, 1955, 59 y.o.   MRN: [id number] Patient ID: Marc [name], male   DOB: 27-Apr-1956, 59 y.o.   MRN: [id number] Patient ID: Marc [name], male   DOB: 16-Jan-1956, 59 y.o.   MRN: [id number] [facility] [address] Progress Note Marc [name] MRN: [id number] DOB: [birth year] Age: 59 y.o.  Date: 11/08/2014   Chief Complaint: Chief Complaint  Patient presents with  . Depression  . Anxiety  . 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 He has one son age 85 years old and lives in [place]  The patient states he has a history depression that started around 2009. Prior to that year he been treated with ribavirin to treat hepatitis C which made him very depressed. He's also had a number of deaths in his family-both sisters have died as well as both parents in the last several years. His son is very close to him and he only talks to him periodically.  Currently the patient is struggling with medical problems including neck and back pain. He recently had a neurostimulator installed in his back which is helped to some degree. Lately he's been a bit more depressed because his aunt keeps calling and wanting to talk about all the dead relatives people he's been draining a lot about people who have died. He's not been seeing his counselor regularly and I think it would be helpful for him to get back into counseling. He does  have friends and he walks his dog 2 miles a day. He admits to passive suicidal ideation but would never hurt himself.  The patient returns for followup after 2 months. He still ruminates a lot at night and wonders why he had to spend time in jail and false charges and the people who betrayed him. He's trying not to think too much about it and getting out and walking his dog. He still has chronic back pain pain in his right arm from surgeries and a trigger finger on his left hand. He's going to try to get surgery done on the left hand first. He's friends with various people at most of them are very negative and have serious problems cells I suggested he try to find friends or more positive. He's going to try going to a church  .  Current psychiatric medication Prozac 20 mg in AM Seroquel 50 mg 3 times a day and 2 at bedtime   Klonopin 0.5 mg 1-2 tablet  Vitamin D 50,000 IU weekly prescribed by PCP  Past psychiatric history Patient has history of depression for many years. He has taken in the past Wellbutrin Cymbalta and Celexa. He denies any history of suicidal attempt.  Alcohol and substance use history Patient has significant history of alcohol with multiple rehabilitation and detox treatment. He has history of DWI.  He also had history of taking recreational drugs and he was in the [employer]. He has history of intravenous drug use that cause hepatitis C. Patient also had history of using pain medication.  Allergies: Allergies  Allergen Reactions  . Levofloxacin Nausea Only and Other (See Comments)    "Creepy" feeling, skin didn't feel right, sort of itchy.  Baker Pierini [Lubiprostone]     MADE HIM FEEL STIFF  . Sulfa Antibiotics    Medical History: Past Medical History  Diagnosis Date  . Hepatitis C reactive     LIVER BX 2008-CHRONIC ACTIVE HEPATITIS  . Back problem   . GERD (gastroesophageal reflux disease)   . IBS (irritable bowel syndrome)   . HTN (hypertension)   .  Hypercholesterolemia   . Hx of adenomatous colonic polyps 2008    due for surveillance 2018  . Knee pain   . Substance abuse     narcotic addiction  . Thyroiditis     History of  . Normal echocardiogram 05/25/2011    EF 55%, mild TR  . Normal cardiac stress test 05/25/2011    [facility] [place]  . Depression   . Hepatitis C 1979  Cervical fusion Neck surgery Neck pain Hepatitis C Hypertension Benign tremors High blood pressure He sees Dr. Moshe Cipro and Dr. Paulita Cradle for pain management.  Surgical History: Past Surgical History  Procedure Laterality Date  . Appendectomy    . Tonsillectomy    . Carpal tunnel release      rt  . Right ring finger    . Spinal stenosis, had screws put in neck  04/11/2009    DR KRITZER  . Upper gastrointestinal endoscopy  2008 [facility] ABD PAIN WEIGHT LOSS d100 v8 phen 12.5    NL  . Colonoscopy  2008 [facility] ARS D100 V8 PHEN 12.5    2 SIMPLE ADENOMAS (< 1 CM)  . Knee arthroscopy    . Ulnar nerve transposition    . Esophagogastroduodenoscopy  04/26/2012    [facility]: Non-erosive gastritis (inflammation) was found in the gastric antrum but no H.pylori; multiple biopsies (duodenal bx negative for Celiac)/ The mucosa of the esophagus appeared normal  . Eus  09/08/2012    Procedure: UPPER ENDOSCOPIC ULTRASOUND (EUS) LINEAR;  Surgeon: Milus Banister, MD;  [facility];  Service: Endoscopy;  Laterality: N/A;  . Neurostimulator implant    . Wrist surgery Right jan 2015    Dr. Edmonia Lynch  . Hardware removal Right 02/12/2014    Procedure: HARDWARE REMOVAL;  Surgeon: Jolyn Nap, MD;  Location: [place];  Service: Orthopedics;  Laterality: Right;  . Ulna osteotomy Right 02/12/2014    Procedure: RIGHT ULNAR SHORTENING AND OSTEOTOMY ;  Surgeon: Jolyn Nap, MD;  Location: [place];  Service: Orthopedics;  Laterality: Right;   Family History: family history includes ADD / ADHD in his cousin; Alcohol abuse in  his cousin, father, and paternal uncle; Anxiety disorder in his cousin; Bipolar disorder in his cousin; Cancer in his mother and sister; Dementia in his paternal aunt, paternal grandmother, and paternal uncle; Depression in his cousin; Emotional abuse in his sister and sister; Heart defect in an other family member; Heart failure in his father; OCD in his cousin; Paranoid behavior in his cousin; Stroke in his father and sister. There is no history of Colon cancer, Colon polyps, Drug abuse, Schizophrenia, Seizures, Sexual abuse, or Physical abuse. Reviewed again in the office and no changes were noted except  the new pain issues  Psychosocial history Patient was born and raised in [place].  He married twice. 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 Patient has multiple family member who has died in past few years. His father died in 26-Nov-2007 and mother died in 11/25/2008  and 2 of his sister also died.  Patient lives by himself.  Mental status examination Patient is casually dressed and groomed.. his right arm is no longer in a cast  He maintained fair eye contact. He described his mood as still somewhat depressed but improved.  His speech is slow but clear. His thought process is slow but logical linear and goal-directed. Marland Kitchen  He denies any active or passive suicidal thinking or homicidal thinking. He denies any auditory or visual hallucination. He is alert and oriented x3.  His attention and concentration is distracted. There were no paranoia or delusions present at this time. His insight judgment and impulse control is okay.  Diagnosis Axis I Maj. depressive disorder, polysubstance dependence by history Axis II deferred Axis III see medical history Axis IV mild to moderate Axis V 65-70  Plan/Discussion: I took his vitals.  I reviewed CC, tobacco/med/surg Hx, meds effects/ side effects, problem list, therapies and responses as well as current situation/symptoms  discussed options. He will restart his current medications and get back into therapy as soon as possible. He we'll continue all medications . He'll return to see me in 2 months See orders and pt instructions for more details.  Medical Decision Making Problem Points:  Established problem, stable/improving (1), Review of last therapy session (1) and Review of psycho-social stressors (1) Data Points:  Review or order clinical lab tests (1) Review of medication regiment & side effects (2)  I certify that outpatient services furnished can reasonably be expected to improve the patient's condition.   [name], MD

## 2014-11-12 ENCOUNTER — Ambulatory Visit: Payer: Medicare HMO | Admitting: Family Medicine

## 2014-11-26 ENCOUNTER — Ambulatory Visit (HOSPITAL_COMMUNITY): Payer: Self-pay | Admitting: Psychiatry

## 2014-11-30 NOTE — Op Note (Signed)
PATIENT NAME:  Marc [name], Marc [name] MR#:  [id number] DATE OF BIRTH:  17-Jun-1956  DATE OF PROCEDURE:  11/15/2012  LOCATION: Pain clinic facility.    Location: Operating Room Referring Physician: Neomia Dear, MD Procedure by: Kathlen Brunswick Dossie Arbour, M.D.  Note: This is the case of a 59 year old white male patient referred to our clinic by Dr. Niel Hummer for permanent placement of a bilateral lumbar spinal cord stimulator. The initial trial was done by Dr. Niel Hummer at her office with excellent results. The patient comes in today for a permanent placement.   Procedure(s):  1. Permanent implantation of Lumbar Epidural, Double Percutaneous Neurostimulator Leads (16 electrode array). 2. Implantation of Epidural Neurostimulator Generator. 3. Fluoroscopic Needle Guidance 4. Intraoperative Analysis and Programming. 5. Postoperative Analysis and Programming. 6. Moderate Conscious Sedation by the [facility] Anesthesia Team.  Surgeon: Kathlen Brunswick. Dossie Arbour, M.D. Side of implant: Bilateral Top electrode tip level: Bottom endplate of T7. Diagnostic Indications: Chronic Lumbosacral Radiculopathy/Radiculitis. Position: Prone.  Prepping solution: DuraPrep Area prepped: The thoracolumbar and sacral areas, were prepped with a broad-spectrum topical antiseptic microbicide. Target area: Lumbar Epidural Space, around the T7-T8 vertebral body, for the electrode tip. Insertion site is the T12-L1 intervertebral space. Level entered: T12-L1 Number of attempts: Two  Infection Control: Standard Universal Precautions taken (Respiratory Hygiene/Cough Etiquette; Mouth, nose, eye protection; Hand Hygiene; Personal protective equipment (PPE); safe injection practices; and use of masks and disposable sterile surgical gloves) as recommended by the Department of [place] for Disease Control and Prevention (CDC).  Safety Measures: Allergies were reviewed. Appropriate site, procedure, and patient were  confirmed by following the Joint Commission's Universal Protocol (UP.01.01.01). The patient was asked to confirm marked site and procedure, before commencing. The patient was asked about blood thinners, or active infections, both of which were denied. No attempt was made at seeking any paresthesias. Aspiration looking for blood return was conducted prior to injecting. At no point did we inject any substances, as a needle was being advanced.  Pre-procedure Assessment:  A medical history and physical exam were obtained. Relevant documentation was reviewed and verified. Prior to the procedure, the patient was provided with an Audio CD, as well as written information on the procedure, including side-effects, and possible complications. Under the influence of no sedatives, a verbal, as well as a written informed consent were obtained, after having provided information on the risks and possible complications. To fulfill our ethical and legal obligations, as recommended by the American Medical Association's Code of Ethics, we have provided information to the patient about our clinical impression; the nature and purpose of an available treatment or procedure; the risks and benefits of an available treatment or procedure; alternatives; the risk and benefits of the alternative treatment or procedure; and the risks and benefits of not receiving or undergoing a treatment or procedure. The patient was provided information about the risks and possible complications associated with the procedure. These include, but not limited to, failure to achieve desired goals, infection, bleeding, organ or nerve damage, allergic reactions, paralysis, and death. In addition, the patient was informed that Medicine is not an exact science; therefore, there is also the possibility of unforeseen risks and possible complications that may result in a catastrophic outcome. The patient indicated having understood very clearly.  We have given the  patient no guarantees and we have made no promises. Ample time was given to the patient to ask questions, all of which were answered, to the patient's satisfaction, before proceeding. The patient understands that  by signing our informed consent form, they understand and accept the risks and the fact that it is impossible to predict all possible complications. Baseline vital signs were taken and the medical assessment was completed. Verification of the correct person, correct site (including marking of site), and correct procedure were performed and confirmed by the patient. Baseline vital signs were taken and the initial assessment was completed. Verification of the correct person, correct site (including marking of site), and correct procedure were performed and confirmed by the patient, in the form of a "Time Out".  Monitoring: The patient was monitored in the usual manner, using NIBPM, ECG, and pulse oximetry.  IV Access:  An IV access was obtained and secured.  Analgesia:  Moderate (Conscious) Intravenous sedation: Consent was obtained before administering any sedation. Availability of a responsible, adult driver, and NPO status confirmed. Meaningful verbal contact was maintained, with the patient at all times during the procedure. ASA Sedation Guidelines followed. For specifics on pharmacological type and quantity of sedation, please see nursing chart.  Prophylactic Antibiotics: Cefazolin (1st generation cephalosporin) 1 gm IVPB.  Local Anesthesia: Lidocaine 1%. The skin over the procedure site were infiltrated using a 3 ml Luer-Lok syringe with a 0.5 inch, 25-G needle. Deeper tissues were infiltrated using a 3.0 inch, 22-G spinal needle, under fluoroscopic guidance.  Fluoroscopy: The patient was taken to the operative suite, where the patient was placed in position for the procedure, over the fluoroscopy compatible table. Fluoroscopy was manipulated, using "Tunnel Vision Technique", to obtain the  best possible view of the target area, on the affected side. Parallax error was corrected before commencing the procedure. Gabor Racz's "Direction-Depth-Direction" technique was used to introduce the procedural needle under continuous pulsed fluoroscopic guidance. Once the target was reached, antero-posterior and lateral fluoroscopic views were taken to confirm needle placement in two planes. Fluoroscopy time: Please see the patient's chart for details.  Description of the procedure: The procedure site was prepped using a broad-spectrum topical antiseptic. The area was then draped in the usual and standard manner. "Time-out" was performed as per JC Universal Protocol (UP.01.01.01).   A midline incision was made over the spinous processes, and hemostasis attained. The procedural needle was then introduced through the incision using Gabor Racz's "Direction-Depth-Direction" technique, under pulsed fluoroscopic guidance. No attempt was made at seeking a paresthesia. The paramidline approach was used to enter the posterior lumbar epidural space at a 30 degree angle, using "Loss-of-resistance Technique" with 3 ml of PF-NaCl (0.9% NSS) + 0.5 ml of air, in a 5 ml glass syringe, using a "loss-of-bounce technique", at the desired level. Correct needle placement was confirmed in the  antero-posterior and lateral fluoroscopic views. The epidural lead was gently introduced under real-time fluoroscopy, constantly assessing for pain or paresthesias, until the tip was observed to be at the target level, on the side ipsilateral to the pain. The placement of the second lead was accomplished in a similar fashion. Once the target was thought to have been reached, antero-posterior and lateral fluoroscopic views were taken to confirm electrode placement in two planes. Placement was tested until a comfortable stimulation pattern was observed over the usual painful area. Once the patient had assured [name] that the stimulation was in the  correct pattern, and distribution, we proceeded to remove the 15-G "Tuohy" epidural needle(s). This was done while observing the electrode tip(s) under real-time fluoroscopy to prevent movement. The patient was sedated again, and 1% lidocaine was infiltrated into the buttocks area, in order to create  the generator pocket. The extension was tunneled and connected to the generator. An impedance check was conducted after connecting all sections of the system. Once hemostasis was confirmed, both wounds were closed with Vicryl 2-0 after cleaning them with a solution containing 50:50 hydrogen peroxide and Betadine. Surgical staples were used to close the skin. The wounds were covered with sterile transparent bio-occlusive dressings, to easily assess any evidence of infection in the future.  The patient tolerated the entire procedure well. A repeat set of vitals were taken after the procedure and the patient was kept under observation until discharge criteria was met. The patient was provided with discharge instructions, including a section on how to identify potential problems. Should any problems arise concerning this procedure, the patient was given instructions to immediately contact us, without hesitation. The neurostimulator representative and I, both provided the patient with our Business cards containing our contact telephone numbers, and instructed the patient to contact either one of [name], at any time, should there be any problems or questions. In any case, we plan to contact the patient by telephone for a follow-up status report regarding this interventional procedure.  EBL: 20 ml  Complications: No heme; no paresthesias.  Disposition: Return to clinics in 10-11 days for removal of staples and postoperative evaluation.  Additional Comments/Plan: None.  Equipment used:   1.  Medtronic RestoreSensor SureScan MRI, model [id number], serial [id number].  2.  Medtronic Lead Kit, model [id number], lot #[id number].   3.  Medtronic Lead Kit, model [id number], lot #[id number].     Disclaimer: Medicine is not an Chief Strategy Officer. The only guarantee in medicine is that nothing is guaranteed. It is important to note that the decision to proceed with this intervention was based on the information collected from the patient. The Data and conclusions were drawn from the patient's questionnaire, the interview, and the physical examination. Because the information was provided in large part by the patient, it cannot be guaranteed that it has not been purposely or unconsciously manipulated. Every effort has been made to obtain as much relevant data as possible for this evaluation. It is important to note that the conclusions that lead to this procedure are derived in large part from the available data. Always take into account that the treatment will also be dependent on availability of resources and existing treatment guidelines, considered by other Pain Management Practitioners as being common knowledge and practice, at this time. For Medico-Legal purposes, it is also important to point out that variations in procedural techniques and pharmacological choices are the acceptable norm. The indications, contraindications, technique, and results of the above procedure should only be interpreted and judged by a Board-Certified Interventional Pain Specialist with extensive familiarity and expertise in the same exact procedure and technique, doing otherwise would be inappropriate and unethical.     ____________________________ Kathlen Brunswick. Dossie Arbour, MD fan:cc D: 11/15/2012 15:47:39 ET T: 11/15/2012 92:44:62 ET JOB#: [id number]  cc: Anala Whisenant A. Dossie Arbour, MD, <Dictator> Gaspar Cola MD ELECTRONICALLY SIGNED 11/17/2012 12:01

## 2014-12-10 ENCOUNTER — Other Ambulatory Visit: Payer: Self-pay | Admitting: Family Medicine

## 2014-12-11 ENCOUNTER — Telehealth: Payer: Self-pay | Admitting: *Deleted

## 2014-12-11 ENCOUNTER — Other Ambulatory Visit: Payer: Self-pay | Admitting: Family Medicine

## 2014-12-11 NOTE — Telephone Encounter (Signed)
Pt called and made a appt for 5/19 at 1:30. Pt states he needs a refill on his medications.

## 2014-12-11 NOTE — Telephone Encounter (Signed)
Requested medications refilled 

## 2014-12-18 ENCOUNTER — Ambulatory Visit (HOSPITAL_COMMUNITY): Payer: Self-pay | Admitting: Psychiatry

## 2014-12-27 ENCOUNTER — Encounter: Payer: Self-pay | Admitting: Family Medicine

## 2014-12-27 ENCOUNTER — Ambulatory Visit (INDEPENDENT_AMBULATORY_CARE_PROVIDER_SITE_OTHER): Payer: Medicare HMO | Admitting: Family Medicine

## 2014-12-27 VITALS — BP 120/78 | HR 82 | Resp 16 | Ht 70.0 in | Wt 170.1 lb

## 2014-12-27 DIAGNOSIS — R7303 Prediabetes: Secondary | ICD-10-CM

## 2014-12-27 DIAGNOSIS — I10 Essential (primary) hypertension: Secondary | ICD-10-CM

## 2014-12-27 DIAGNOSIS — Z1211 Encounter for screening for malignant neoplasm of colon: Secondary | ICD-10-CM

## 2014-12-27 DIAGNOSIS — J302 Other seasonal allergic rhinitis: Secondary | ICD-10-CM

## 2014-12-27 DIAGNOSIS — R7309 Other abnormal glucose: Secondary | ICD-10-CM

## 2014-12-27 DIAGNOSIS — F331 Major depressive disorder, recurrent, moderate: Secondary | ICD-10-CM

## 2014-12-27 MED ORDER — MOMETASONE FUROATE 50 MCG/ACT NA SUSP: 2.0000 | Freq: Every day | NASAL | Status: DC

## 2014-12-27 NOTE — Patient Instructions (Addendum)
Annual wellness in 4 month, call if you need me before  Additional medication for daily use for allergies is nasonex, continue claritin and saline fl;ushes as before  BP is good no med change  Labs today  Call forsooner appt with Dr Harrington Challenger based on today's discussion please  You are referred for a colonoscopy  Thanks for choosing [facility], we consider it a privelige to serve you. Hope thuings improve soon!

## 2014-12-28 LAB — LIPID PANEL
CHOL/HDL RATIO: 2.9 ratio
CHOLESTEROL: 171 mg/dL (ref 0–200)
HDL: 58 mg/dL (ref >=40)
LDL CALC: 97 mg/dL (ref 0–99)
TRIGLYCERIDES: 81 mg/dL (ref <150)
VLDL: 16 mg/dL (ref 0–40)

## 2014-12-28 LAB — COMPREHENSIVE METABOLIC PANEL
ALBUMIN: 4.1 g/dL (ref 3.5–5.2)
ALT: 157 U/L — ABNORMAL HIGH (ref 0–53)
AST: 108 U/L — ABNORMAL HIGH (ref 0–37)
Alkaline Phosphatase: 88 U/L (ref 39–117)
BUN: 21 mg/dL (ref 6–23)
CHLORIDE: 104 meq/L (ref 96–112)
CO2: 28 meq/L (ref 19–32)
Calcium: 9.8 mg/dL (ref 8.4–10.5)
Creat: 1.15 mg/dL (ref 0.50–1.35)
GLUCOSE: 105 mg/dL — AB (ref 70–99)
Potassium: 4.9 mEq/L (ref 3.5–5.3)
SODIUM: 137 meq/L (ref 135–145)
TOTAL PROTEIN: 7.3 g/dL (ref 6.0–8.3)
Total Bilirubin: 0.7 mg/dL (ref 0.2–1.2)

## 2014-12-28 LAB — HEMOGLOBIN A1C
Hgb A1c MFr Bld: 5.8 % — ABNORMAL HIGH (ref <5.7)
Mean Plasma Glucose: 120 mg/dL — ABNORMAL HIGH (ref <117)

## 2015-01-08 ENCOUNTER — Ambulatory Visit (INDEPENDENT_AMBULATORY_CARE_PROVIDER_SITE_OTHER): Payer: Medicare HMO | Admitting: Psychiatry

## 2015-01-08 ENCOUNTER — Encounter (HOSPITAL_COMMUNITY): Payer: Self-pay | Admitting: Psychiatry

## 2015-01-08 VITALS — BP 130/86 | HR 100 | Ht 70.0 in | Wt 172.4 lb

## 2015-01-08 DIAGNOSIS — F331 Major depressive disorder, recurrent, moderate: Secondary | ICD-10-CM

## 2015-01-08 DIAGNOSIS — F329 Major depressive disorder, single episode, unspecified: Secondary | ICD-10-CM

## 2015-01-08 MED ORDER — FLUOXETINE HCL 20 MG PO CAPS: 20.0000 mg | ORAL_CAPSULE | Freq: Every day | ORAL | Status: DC

## 2015-01-08 MED ORDER — CLONAZEPAM 0.5 MG PO TABS: 0.5000 mg | ORAL_TABLET | Freq: Three times a day (TID) | ORAL | Status: DC | PRN

## 2015-01-08 MED ORDER — QUETIAPINE FUMARATE 50 MG PO TABS: ORAL_TABLET | ORAL | Status: DC

## 2015-01-08 MED ORDER — GABAPENTIN 400 MG PO CAPS: 400.0000 mg | ORAL_CAPSULE | Freq: Three times a day (TID) | ORAL | Status: DC

## 2015-01-08 NOTE — Progress Notes (Signed)
Patient ID: Marc [name], male   DOB: [birth year], 59 y.o.   MRN: [id number] Patient ID: Marc [name], male   DOB: [name][birth year], 59 y.o.   MRN: [id number] Patient ID: Marc [name], male   DOB: [birth year], 59 y.o.   MRN: [id number] Patient ID: Marc [name], male   DOB: [birth year], 59 y.o.   MRN: [id number] Patient ID: Marc [name], male   DOB: [birth year], 59 y.o.   MRN: [id number] Patient ID: Marc [name], male   DOB: [birth year] 59 y.o.   MRN: [id number] Patient ID: Marc [name], male   DOB: [birth year][name], 59 y.o.   MRN: [id number] Patient ID: Marc [name], male   DOB: [birth year], 59 y.o.   MRN: [id number] Patient ID: Marc [name], male   DOB: [birth year], 59 y.o.   MRN: [id number] Marc [name], Marc [name]   Chief Complaint: Chief Complaint  Patient presents with  . Depression  . Anxiety  . 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 He has one son age 64 years old and lives in [place]  The patient states he has a history depression that started around 2009. Prior to that year he been treated with ribavirin to treat hepatitis C which made him very depressed. He's also had a number of deaths in his family-both sisters have died as well as both parents in the last several years. His son is very close to him and he only talks to him periodically.  Currently the patient is struggling with medical problems including neck and back pain. He recently had a neurostimulator installed in his back which is helped to some degree. Lately he's been a bit more depressed because his aunt keeps calling and wanting to talk about all the dead relatives. He's not been seeing his counselor regularly and I think it would be helpful for him to get back into counseling. He does  have friends and he walks his dog 2 miles a day. He admits to passive suicidal ideation but would never hurt himself.  The patient returns for followup after 3 months. He is doing about the same. He still thinks a lot about his time in jail and why he had to be there and how poorly he was treated by the guards. He also feels like friends and family of drifted away from him and he is alone all the time. He still hasn't made any efforts to join a church. He is trying to call more friends . He denies suicidal ideation but still seems somewhat down. He is thinking about moving to another country but it's hard to tell if he serious about this. He still feels his medicines are effective and he doesn't really want to change anything  .  Current psychiatric medication Prozac 20 mg in AM Seroquel 50 mg 3 times a day and 2 at bedtime   Klonopin 0.5 mg 1-2 tablet  Vitamin D 50,000 IU weekly prescribed by PCP  Past psychiatric history Patient has history of depression for many years. He has taken in the past Wellbutrin Cymbalta and Celexa. He denies any history of suicidal attempt.  Alcohol and substance use history Patient has significant history of alcohol with multiple rehabilitation and detox treatment. He has history  of DWI. He also had history of taking recreational drugs and he was in the [employer]. He has history of intravenous drug use that cause hepatitis C. Patient also had history of using pain medication.  Allergies: Allergies  Allergen Reactions  . Levofloxacin Nausea Only and Other (See Comments)    "Creepy" feeling, skin didn't feel right, sort of itchy.  Baker Pierini [Lubiprostone]     MADE HIM FEEL STIFF  . Sulfa Antibiotics    Medical History: Past Medical History  Diagnosis Date  . Hepatitis C reactive     LIVER BX 2008-CHRONIC ACTIVE HEPATITIS  . Back problem   . GERD (gastroesophageal reflux disease)   . IBS (irritable bowel syndrome)   . HTN (hypertension)   .  Hypercholesterolemia   . Hx of adenomatous colonic polyps 2008    due for surveillance 2018  . Knee pain   . Substance abuse     narcotic addiction  . Thyroiditis     History of  . Normal echocardiogram 05/25/2011    EF 55%, mild TR  . Normal cardiac stress test 05/25/2011    [facility] [place]  . Depression   . Hepatitis C 1979  Cervical fusion Neck surgery Neck pain Hepatitis C Hypertension Benign tremors High blood pressure He sees Dr. Moshe Cipro and Dr. Paulita Cradle for pain management.  Surgical History: Past Surgical History  Procedure Laterality Date  . Appendectomy    . Tonsillectomy    . Carpal tunnel release      rt  . Right ring finger    . Spinal stenosis, had screws put in neck  04/11/2009    DR KRITZER  . Upper gastrointestinal endoscopy  2008 [facility] ABD PAIN WEIGHT LOSS d100 v8 phen 12.5    NL  . Colonoscopy  2008 [facility] ARS D100 V8 PHEN 12.5    2 SIMPLE ADENOMAS (< 1 CM)  . Knee arthroscopy    . Ulnar nerve transposition    . Esophagogastroduodenoscopy  04/26/2012    [facility]: Non-erosive gastritis (inflammation) was found in the gastric antrum but no H.pylori; multiple biopsies (duodenal bx negative for Celiac)/ The mucosa of the esophagus appeared normal  . Eus  09/08/2012    Procedure: UPPER ENDOSCOPIC ULTRASOUND (EUS) LINEAR;  Surgeon: Milus Banister, MD;  Location: [facility];  Service: Endoscopy;  Laterality: N/A;  . Neurostimulator implant    . Wrist surgery Right jan 2015    Dr. Edmonia Lynch  . Hardware removal Right 02/12/2014    Procedure: HARDWARE REMOVAL;  Surgeon: Jolyn Nap, MD;  Location: [facility];  Service: Orthopedics;  Laterality: Right;  . Ulna osteotomy Right 02/12/2014    Procedure: RIGHT ULNAR SHORTENING AND OSTEOTOMY ;  Surgeon: Jolyn Nap, MD;  Location: [facility];  Service: Orthopedics;  Laterality: Right;   Family History: family history includes ADD / ADHD in his cousin; Alcohol abuse in  his cousin, father, and paternal uncle; Anxiety disorder in his cousin; Bipolar disorder in his cousin; Cancer in his mother and sister; Dementia in his paternal aunt, paternal grandmother, and paternal uncle; Depression in his cousin; Emotional abuse in his sister and sister; Heart defect in an other family member; Heart failure in his father; OCD in his cousin; Paranoid behavior in his cousin; Stroke in his father and sister. There is no history of Colon cancer, Colon polyps, Drug abuse, Schizophrenia, Seizures, Sexual abuse, or Physical abuse. Reviewed again in the office and no changes were  noted except the new pain issues  Psychosocial history Patient was born and raised in [place].  He married twice. 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 Patient has multiple family member who has died in past few years. His father died in November 13, 2007 and mother died in 12-Nov-2008  and 2 of his sister also died.  Patient lives by himself.  Mental status examination Patient is casually dressed and groomed.. his right arm is no longer in a cast  He maintained fair eye contact. He described his mood as still somewhat depressed but improved.  His speech is slow but clear. His thought process is slow but logical linear and goal-directed. Marland Kitchen  He denies any active or passive suicidal thinking or homicidal thinking. He denies any auditory or visual hallucination. He is alert and oriented x3.  His attention and concentration is distracted. There were no paranoia or delusions present at this time. His insight judgment and impulse control is okay. Review of systems is positive for chronic back pain Diagnosis Axis I Maj. depressive disorder, polysubstance dependence by history Axis II deferred Axis III see medical history Axis IV mild to moderate Axis V 65-70  Plan/Discussion: I took his vitals.  I reviewed CC, tobacco/med/surg Hx, meds effects/ side effects, problem list, therapies and  responses as well as current situation/symptoms discussed options. He will continue clonazepam for anxiety, Prozac for depression, Seroquel for mood stabilization, Neurontin for anxiety. Continue his counseling and return in 3 months or call if needed sooner  See orders and pt instructions for more details.  Medical Decision Making Problem Points:  Established problem, stable/improving (1), Review of last therapy session (1) and Review of psycho-social stressors (1) Data Points:  Review or order clinical lab tests (1) Review of medication regiment & side effects (2)  I certify that outpatient services furnished can reasonably be expected to improve the patient's condition.   [name], MD

## 2015-01-09 ENCOUNTER — Ambulatory Visit (HOSPITAL_COMMUNITY): Payer: Self-pay | Admitting: Psychiatry

## 2015-01-21 DIAGNOSIS — F329 Major depressive disorder, single episode, unspecified: Secondary | ICD-10-CM | POA: Insufficient documentation

## 2015-01-21 NOTE — Assessment & Plan Note (Addendum)
Uncontrolled, add daily nasal steroid, and saline flushes

## 2015-01-21 NOTE — Progress Notes (Signed)
Subjective:    Patient ID: Marc [name], male    DOB: 1955/10/30, 59 y.o.   MRN: [id number]  HPI The PT is here for follow up and re-evaluation of chronic medical conditions, medication management and review of any available recent lab and radiology data.  Preventive health is updated, specifically  Cancer screening and Immunization.   Questions or concerns regarding consultations or procedures which the PT has had in the interim are  addressed. The PT denies any adverse reactions to current medications since the last visit.  1 week h/o increased  Nasal drainage and sinus pressure, drainage is manly clear , and at times bloody, getting better overall. No cough , fever, chills Increased depression and anxiety, not suicidal or homicidal will schedule closer f/u with psychiatry       Review of Systems See HPI Denies chest pains, palpitations and leg swelling Denies abdominal pain, nausea, vomiting,diarrhea or constipation.   Denies dysuria, frequency, hesitancy or incontinence. Denies joint pain, swelling and limitation in mobility. Denies headaches, seizures, numbness, or tingling.  Denies skin break down or rash.        Objective:   Physical Exam BP 120/78 mmHg  Pulse 82  Resp 16  Ht 5\' 10"  (1.778 m)  Wt 170 lb 1.9 oz (77.166 kg)  BMI 24.41 kg/m2  SpO2 99% Patient alert and oriented and in no cardiopulmonary distress.  HEENT: No facial asymmetry, EOMI,   oropharynx pink and moist.  Neck supple no JVD, no mass. Nasal congestion and clear drainage, npo sinus pressure , TM clear Chest: Clear to auscultation bilaterally.  CVS: S1, S2 no murmurs, no S3.Regular rate.  ABD: Soft non tender.   Ext: No edema  MS: Adequate ROM spine, shoulders, hips and knees.  Skin: Intact, no ulcerations or rash noted.  Psych: Good eye contact, normal affect. Memory intact not anxious or depressed appearing.  CNS: CN 2-12 intact, power,  normal throughout.no focal deficits  noted.        Assessment & Plan:  Essential hypertension Controlled, no change in medication DASH diet and commitment to daily physical activity for a minimum of 30 minutes discussed and encouraged, as a part of hypertension management. The importance of attaining a healthy weight is also discussed.  BP/Weight 12/27/2014 08/28/2014 08/08/2014 06/12/2014 02/23/2014 02/12/2014 4/70/9628  Systolic BP 366 294 765 465 035 465 -  Diastolic BP 78 72 78 82 67 80 -  Wt. (Lbs) 170.12 - 170.12 168.04 - - -  BMI 24.41 - 24.41 22.79 - - 23.68  Some encounter information is confidential and restricted. Go to Review Flowsheets activity to see all data.        Seasonal allergies Uncontrolled, add daily nasal steroid, and saline flushes  Prediabetes Improved Patient educated about the importance of limiting  Carbohydrate intake , the need to commit to daily physical activity for a minimum of 30 minutes , and to commit weight loss. The fact that changes in all these areas will reduce or eliminate all together the development of diabetes is stressed.   Diabetic Labs Latest Ref Rng 12/27/2014 08/07/2014 01/25/2014 06/29/2013 12/08/2012  HbA1c <5.7 % 5.8(H) 5.9(H) - 5.4 -  Chol 0 - 200 mg/dL 171 153 - 193 -  HDL >=40 mg/dL 58 36(L) - 66 -  Calc LDL 0 - 99 mg/dL 97 103(H) - 112(H) -  Triglycerides <150 mg/dL 81 71 - 77 -  Creatinine 0.50 - 1.35 mg/dL 1.15 0.99 1.10 1.02 0.94  BP/Weight 12/27/2014 08/28/2014 08/08/2014 06/12/2014 02/23/2014 02/12/2014 09/20/1733  Systolic BP 670 141 030 131 438 887 -  Diastolic BP 78 72 78 82 67 80 -  Wt. (Lbs) 170.12 - 170.12 168.04 - - -  BMI 24.41 - 24.41 22.79 - - 23.68  Some encounter information is confidential and restricted. Go to Review Flowsheets activity to see all data.   No flowsheet data found.     MDD (major depressive disorder) Treated by psychiatry, will schedule f/u asap as declining, not suicidal or homicidal

## 2015-01-21 NOTE — Assessment & Plan Note (Signed)
Treated by psychiatry, will schedule f/u asap as declining, not suicidal or homicidal

## 2015-01-21 NOTE — Assessment & Plan Note (Signed)
Improved Patient educated about the importance of limiting  Carbohydrate intake , the need to commit to daily physical activity for a minimum of 30 minutes , and to commit weight loss. The fact that changes in all these areas will reduce or eliminate all together the development of diabetes is stressed.   Diabetic Labs Latest Ref Rng 12/27/2014 08/07/2014 01/25/2014 06/29/2013 12/08/2012  HbA1c <5.7 % 5.8(H) 5.9(H) - 5.4 -  Chol 0 - 200 mg/dL 171 153 - 193 -  HDL >=40 mg/dL 58 36(L) - 66 -  Calc LDL 0 - 99 mg/dL 97 103(H) - 112(H) -  Triglycerides <150 mg/dL 81 71 - 77 -  Creatinine 0.50 - 1.35 mg/dL 1.15 0.99 1.10 1.02 0.94   BP/Weight 12/27/2014 08/28/2014 08/08/2014 06/12/2014 02/23/2014 02/12/2014 5/32/9924  Systolic BP 268 341 962 229 798 921 -  Diastolic BP 78 72 78 82 67 80 -  Wt. (Lbs) 170.12 - 170.12 168.04 - - -  BMI 24.41 - 24.41 22.79 - - 23.68  Some encounter information is confidential and restricted. Go to Review Flowsheets activity to see all data.   No flowsheet data found.

## 2015-01-21 NOTE — Assessment & Plan Note (Signed)
Controlled, no change in medication DASH diet and commitment to daily physical activity for a minimum of 30 minutes discussed and encouraged, as a part of hypertension management. The importance of attaining a healthy weight is also discussed.  BP/Weight 12/27/2014 08/28/2014 08/08/2014 06/12/2014 02/23/2014 02/12/2014 04/04/4157  Systolic BP 309 407 680 881 103 159 -  Diastolic BP 78 72 78 82 67 80 -  Wt. (Lbs) 170.12 - 170.12 168.04 - - -  BMI 24.41 - 24.41 22.79 - - 23.68  Some encounter information is confidential and restricted. Go to Review Flowsheets activity to see all data.

## 2015-01-24 ENCOUNTER — Other Ambulatory Visit: Payer: Self-pay | Admitting: Family Medicine

## 2015-02-04 ENCOUNTER — Ambulatory Visit (HOSPITAL_COMMUNITY): Payer: Self-pay | Admitting: Psychiatry

## 2015-02-08 ENCOUNTER — Other Ambulatory Visit: Payer: Self-pay | Admitting: Family Medicine

## 2015-02-12 ENCOUNTER — Telehealth: Payer: Self-pay | Admitting: *Deleted

## 2015-02-12 NOTE — Telephone Encounter (Signed)
Medication refilled

## 2015-02-12 NOTE — Telephone Encounter (Signed)
Pt called requesting to have clonidine refilled. Please advise

## 2015-02-15 ENCOUNTER — Telehealth: Payer: Self-pay

## 2015-02-15 NOTE — Telephone Encounter (Signed)
Corrected

## 2015-02-15 NOTE — Telephone Encounter (Addendum)
Received a referral for pt to have a screening colonoscopy. I called and he is not having any problems and according to our records he is not due til 2018.  Last one was 11/2006 by Dr. Oneida Alar.   He is aware he is on recall.   Manuela Schwartz, please note the recall should be for 2018 not 2017.  Not sure who did that.  Thanks!

## 2015-02-20 ENCOUNTER — Other Ambulatory Visit: Payer: Self-pay | Admitting: Family Medicine

## 2015-02-21 NOTE — Telephone Encounter (Signed)
PLEASE CALL PT. HE HAD ONE SIMPLE ADENOMA REMOVED IN 2008. THE RECOMMENDATION IS HE HAVE A COLONOSCOPY IN THE NEXT 5-10 YEARS. HE MAY HAVE A COLONOSCOPY WITH [facility] DUE TO POLYPHARMACY IN 2016 OR WAIT UNTIL 2017 OR 2018. WHENEVER HE'S READY TO HAVE ONE HE CAN CALL, & BE TRIAGED FOR TCS.

## 2015-02-22 NOTE — Telephone Encounter (Signed)
LMOM to call.

## 2015-02-26 NOTE — Telephone Encounter (Signed)
Pt was informed he could have the colonoscopy when he decides. He has not decided yet to have it, but he is having some problems with constipation. He said he uses a stool softener but he still has to strain when he has a BM some. He is scheduled for OV with Neil Crouch, PA on 03/26/2015 at 11:30 Am.

## 2015-03-20 ENCOUNTER — Other Ambulatory Visit (HOSPITAL_COMMUNITY): Payer: Self-pay | Admitting: Psychiatry

## 2015-03-26 ENCOUNTER — Ambulatory Visit: Payer: Self-pay | Admitting: Gastroenterology

## 2015-03-26 ENCOUNTER — Telehealth: Payer: Self-pay | Admitting: Gastroenterology

## 2015-03-26 ENCOUNTER — Encounter: Payer: Self-pay | Admitting: Gastroenterology

## 2015-03-26 NOTE — Telephone Encounter (Signed)
PATIENT WAS A NO SHOW AND LETTER SENT  °

## 2015-03-28 ENCOUNTER — Other Ambulatory Visit (HOSPITAL_COMMUNITY): Payer: Self-pay | Admitting: Psychiatry

## 2015-04-08 ENCOUNTER — Other Ambulatory Visit: Payer: Self-pay | Admitting: Family Medicine

## 2015-04-10 ENCOUNTER — Encounter (HOSPITAL_COMMUNITY): Payer: Self-pay | Admitting: Psychiatry

## 2015-04-10 ENCOUNTER — Ambulatory Visit (INDEPENDENT_AMBULATORY_CARE_PROVIDER_SITE_OTHER): Payer: Medicare HMO | Admitting: Psychiatry

## 2015-04-10 VITALS — BP 110/80 | Ht 70.0 in | Wt 172.0 lb

## 2015-04-10 DIAGNOSIS — F1920 Other psychoactive substance dependence, uncomplicated: Secondary | ICD-10-CM | POA: Diagnosis not present

## 2015-04-10 DIAGNOSIS — F331 Major depressive disorder, recurrent, moderate: Secondary | ICD-10-CM | POA: Diagnosis not present

## 2015-04-10 MED ORDER — QUETIAPINE FUMARATE 50 MG PO TABS: ORAL_TABLET | ORAL | Status: DC

## 2015-04-10 MED ORDER — CLONAZEPAM 0.5 MG PO TABS: 0.5000 mg | ORAL_TABLET | Freq: Three times a day (TID) | ORAL | Status: DC | PRN

## 2015-04-10 MED ORDER — FLUOXETINE HCL 20 MG PO CAPS: 20.0000 mg | ORAL_CAPSULE | Freq: Two times a day (BID) | ORAL | Status: DC

## 2015-04-10 MED ORDER — GABAPENTIN 400 MG PO CAPS: 400.0000 mg | ORAL_CAPSULE | Freq: Three times a day (TID) | ORAL | Status: DC

## 2015-04-10 NOTE — Progress Notes (Signed)
Patient ID: Marc [name], male   DOB: 1956/05/14, 59 y.o.   MRN: [id number] Patient ID: Marc [name], male   DOB: Dec 18, 1955, 59 y.o.   MRN: [id number] Patient ID: Marc [name], male   DOB: 05-01-1956, 59 y.o.   MRN: [id number] Patient ID: Marc [name], male   DOB: 12-19-1955, 59 y.o.   MRN: [id number] Patient ID: Marc [name], male   DOB: 1956/03/02, 59 y.o.   MRN: [id number] Patient ID: Marc [name], male   DOB: 1956-05-26, 59 y.o.   MRN: [id number] Patient ID: Marc [name], male   DOB: 1956-07-29, 59 y.o.   MRN: [id number] Patient ID: Marc [name], male   DOB: Mar 12, 1956, 59 y.o.   MRN: [id number] Patient ID: Marc [name], male   DOB: Apr 17, 1956, 59 y.o.   MRN: [id number] Patient ID: Marc [name], male   DOB: 11/28/1955, 59 y.o.   MRN: [id number] [facility] [id number] Progress Note Marc [name] MRN: [id number] DOB: 1956-02-11 Age: 59 y.o.  Date: 04/10/2015   Chief Complaint: Chief Complaint  Patient presents with  . Depression  . Anxiety  . 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 He has one son age 76 years old and lives in [place]  The patient states he has a history depression that started around 2009. Prior to that year he been treated with ribavirin to treat hepatitis C which made him very depressed. He's also had a number of deaths in his family-both sisters have died as well as both parents in the last several years. His son is very close to him and he only talks to him periodically.  Currently the patient is struggling with medical problems including neck and back pain. He recently had a neurostimulator installed in his back which is helped to some degree. Lately he's been a bit more depressed because his aunt keeps calling and wanting to talk about all the dead relatives. He's not been seeing his counselor  regularly and I think it would be helpful for him to get back into counseling. He does have friends and he walks his dog 2 miles a day. He admits to passive suicidal ideation but would never hurt himself.  The patient returns for followup after 61 months. He is more depressed lately. He's been thinking still about the time he had to spend in jail and how unjustly he was treated. He has not been getting out much. His energy is low and he still has pain in his arm and back. He hasn't been back to GI and quite a while to check out his hepatitis C status. I urged him to do this as soon as possible because this may be part of why he is feeling low and there are new treatments available. He asked if he go up on his Prozac and I think this is a reasonable idea. He is also going to restart his counseling here  .  Current psychiatric medication Prozac 20 mg in AM Seroquel 50 mg 3 times a day and 2 at bedtime   Klonopin 0.5 mg 1-2 tablet  Vitamin D 50,000 IU weekly prescribed by PCP  Past psychiatric history Patient has history of depression for many years. He has taken in the past Wellbutrin Cymbalta and Celexa. He denies any history of suicidal attempt.  Alcohol and substance use history Patient has significant history of alcohol with multiple rehabilitation and detox treatment. He has history of DWI. He also had history of taking recreational drugs and he was in the [employer]. He has history of intravenous drug use that cause hepatitis C. Patient also had history of using pain medication.  Allergies: Allergies  Allergen Reactions  . Levofloxacin Nausea Only and Other (See Comments)    "Creepy" feeling, skin didn't feel right, sort of itchy.  Baker Pierini [Lubiprostone]     MADE HIM FEEL STIFF  . Sulfa Antibiotics    Medical History: Past Medical History  Diagnosis Date  . Hepatitis C reactive     LIVER BX 2008-CHRONIC ACTIVE HEPATITIS  . Back problem   . GERD (gastroesophageal reflux disease)   .  IBS (irritable bowel syndrome)   . HTN (hypertension)   . Hypercholesterolemia   . Hx of adenomatous colonic polyps 2008    due for surveillance 2018  . Knee pain   . Substance abuse     narcotic addiction  . Thyroiditis     History of  . Normal echocardiogram 05/25/2011    EF 55%, mild TR  . Normal cardiac stress test 05/25/2011    [facility] [place]  . Depression   . Hepatitis C 1979  Cervical fusion Neck surgery Neck pain Hepatitis C Hypertension Benign tremors High blood pressure He sees Dr. Moshe Cipro and Dr. Paulita Cradle for pain management.  Surgical History: Past Surgical History  Procedure Laterality Date  . Appendectomy    . Tonsillectomy    . Carpal tunnel release      rt  . Right ring finger    . Spinal stenosis, had screws put in neck  04/11/2009    DR KRITZER  . Upper gastrointestinal endoscopy  2008 [facility] ABD PAIN WEIGHT LOSS d100 v8 phen 12.5    NL  . Colonoscopy  2008 [facility] ARS D100 V8 PHEN 12.5    2 SIMPLE ADENOMAS (< 1 CM)  . Knee arthroscopy    . Ulnar nerve transposition    . Esophagogastroduodenoscopy  04/26/2012    [facility]: Non-erosive gastritis (inflammation) was found in the gastric antrum but no H.pylori; multiple biopsies (duodenal bx negative for Celiac)/ The mucosa of the esophagus appeared normal  . Eus  09/08/2012    Procedure: UPPER ENDOSCOPIC ULTRASOUND (EUS) LINEAR;  Surgeon: Milus Banister, MD;  Location: [facility];  Service: Endoscopy;  Laterality: N/A;  . Neurostimulator implant    . Wrist surgery Right jan 2015    Dr. Edmonia Lynch  . Hardware removal Right 02/12/2014    Procedure: HARDWARE REMOVAL;  Surgeon: Jolyn Nap, MD;  Location: [facility];  Service: Orthopedics;  Laterality: Right;  . Ulna osteotomy Right 02/12/2014    Procedure: RIGHT ULNAR SHORTENING AND OSTEOTOMY ;  Surgeon: Jolyn Nap, MD;  Location: [place];  Service: Orthopedics;  Laterality: Right;   Family History: family  history includes ADD / ADHD in his cousin; Alcohol abuse in his cousin, father, and paternal uncle; Anxiety disorder in his cousin; Bipolar disorder in his cousin; Cancer in his mother and sister; Dementia in his paternal aunt, paternal grandmother, and paternal uncle; Depression in his cousin; Emotional abuse in his sister and sister; Heart defect in an other family member; Heart failure in his father; OCD in his cousin; Paranoid behavior in his cousin; Stroke in his father and sister. There is no history of Colon cancer,  Colon polyps, Drug abuse, Schizophrenia, Seizures, Sexual abuse, or Physical abuse. Reviewed again in the office and no changes were noted except the new pain issues  Psychosocial history Patient was born and raised in [place].  He married twice. 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 Patient has multiple family member who has died in past few years. His father died in 11/14/07 and mother died in 11-13-2008  and 2 of his sister also died.  Patient lives by himself.  Mental status examination Patient is casually dressed and groomed.. his right arm is no longer in a cast  He maintained fair eye contact. He described his mood as still somewhat depressed   His speech is slow but clear. His thought process is slow but logical linear and goal-directed. Marland Kitchen  He denies any active or passive suicidal thinking or homicidal thinking. He denies any auditory or visual hallucination. He is alert and oriented x3.  His attention and concentration is distracted. There were no paranoia or delusions present at this time. His insight judgment and impulse control is okay. Review of systems is positive for chronic back pain Diagnosis Axis I Maj. depressive disorder, polysubstance dependence by history Axis II deferred Axis III see medical history Axis IV mild to moderate Axis V 65-70  Plan/Discussion: I took his vitals.  I reviewed CC, tobacco/med/surg Hx, meds  effects/ side effects, problem list, therapies and responses as well as current situation/symptoms discussed options. He will continue clonazepam for anxiety, Prozac for depression but increase the dose to 40 mg daily, Seroquel for mood stabilization, Neurontin for anxiety. Continue his counseling and return in 3 months or call if needed sooner  See orders and pt instructions for more details.  Medical Decision Making Problem Points:  Established problem, stable/improving (1), Review of last therapy session (1) and Review of psycho-social stressors (1) Data Points:  Review or order clinical lab tests (1) Review of medication regiment & side effects (2)  I certify that outpatient services furnished can reasonably be expected to improve the patient's condition.   [name], MD

## 2015-05-02 ENCOUNTER — Ambulatory Visit (HOSPITAL_COMMUNITY): Payer: Self-pay | Admitting: Psychiatry

## 2015-05-16 DIAGNOSIS — M545 Low back pain, unspecified: Secondary | ICD-10-CM | POA: Insufficient documentation

## 2015-05-16 DIAGNOSIS — G8929 Other chronic pain: Secondary | ICD-10-CM | POA: Insufficient documentation

## 2015-05-23 ENCOUNTER — Ambulatory Visit (HOSPITAL_COMMUNITY): Payer: Self-pay | Admitting: Psychiatry

## 2015-05-27 ENCOUNTER — Encounter (HOSPITAL_COMMUNITY): Payer: Self-pay | Admitting: Psychiatry

## 2015-05-27 ENCOUNTER — Ambulatory Visit (INDEPENDENT_AMBULATORY_CARE_PROVIDER_SITE_OTHER): Payer: Medicare HMO | Admitting: Psychiatry

## 2015-05-27 DIAGNOSIS — F331 Major depressive disorder, recurrent, moderate: Secondary | ICD-10-CM

## 2015-05-27 NOTE — Progress Notes (Signed)
      THERAPIST PROGRESS NOTE  Session Time: Monday 05/27/2015 2:12 PM - 3:10 PM  Participation Level: Active  Behavioral Response: CasualAlert/ Anxious, Depressed  Type of Therapy: Individual Therapy  Treatment Goals addressed:    1. Learn and implement calming sills to manage anxiety response to trigger of trauma.      2. Participate in cognitive to help identify, challenge, and replace biased, negative, and self-defeating thoughts resulting from trauma  Interventions: CBT and Supportive  Summary: Marc [name] is a 59 y.o. male who initially was referred by Dr. Adele Schilder to improve coping and social skills as patient was experiencing sadness, depression, grief, and anger issues. The patient has a history of recurrent periods of depression along with a history of chemical dependency. He has had psychiatric hospitalization due to suicidal thoughts and has participated in inpatient treatment as well as outpatient treatment for chemical dependency. He completed the chemical dependency IOP program in [place] in December 2012.   Patient last was seen in March 2016. He is resuming services today due to increased stress related to traumatic experience of being in jail in November 2015. He reports flashbacks, nightmares, intrusive memories, hypervigilance, avoidant behaviors, poor concentration, and startled response. He states being lonely bur  fearing being in crowds . He also fears legal authorities and reports mistrust. He reports incident at his apartment complex that led officers to question him. This triggered increased thoughts about incident of being in jail last year.  He also is experiencing increased stress related to back problems as neurostimulator isn't as effective as it once was. This also has inhibited patient's involvement in activity. He denies suicidal ideations.   Suicidal/Homicidal: No  Therapist Response: Therapist works with patient to review symptoms, identify and  verbalize his feelings , identify strengths, identify support system, develop treatment plan.   Plan: Return again in 2 weeks.  Diagnosis: Axis I: MDD, Recurrent, Moderate    Axis II: No diagnosis    Topanga Alvelo, LCSW 05/27/2015

## 2015-05-27 NOTE — Patient Instructions (Signed)
Discussed orally 

## 2015-06-04 ENCOUNTER — Other Ambulatory Visit (HOSPITAL_COMMUNITY): Payer: Self-pay | Admitting: Psychiatry

## 2015-06-13 ENCOUNTER — Encounter: Payer: Self-pay | Admitting: Family Medicine

## 2015-06-13 ENCOUNTER — Ambulatory Visit (INDEPENDENT_AMBULATORY_CARE_PROVIDER_SITE_OTHER): Payer: Medicare HMO | Admitting: Family Medicine

## 2015-06-13 VITALS — BP 130/64 | HR 96 | Resp 18 | Ht 70.0 in | Wt 177.1 lb

## 2015-06-13 DIAGNOSIS — I10 Essential (primary) hypertension: Secondary | ICD-10-CM

## 2015-06-13 DIAGNOSIS — R7303 Prediabetes: Secondary | ICD-10-CM

## 2015-06-13 DIAGNOSIS — J302 Other seasonal allergic rhinitis: Secondary | ICD-10-CM

## 2015-06-13 DIAGNOSIS — Z23 Encounter for immunization: Secondary | ICD-10-CM | POA: Diagnosis not present

## 2015-06-13 DIAGNOSIS — J309 Allergic rhinitis, unspecified: Secondary | ICD-10-CM | POA: Insufficient documentation

## 2015-06-13 DIAGNOSIS — Z0000 Encounter for general adult medical examination without abnormal findings: Secondary | ICD-10-CM

## 2015-06-13 DIAGNOSIS — R351 Nocturia: Secondary | ICD-10-CM

## 2015-06-13 DIAGNOSIS — J3089 Other allergic rhinitis: Secondary | ICD-10-CM

## 2015-06-13 DIAGNOSIS — Z125 Encounter for screening for malignant neoplasm of prostate: Secondary | ICD-10-CM

## 2015-06-13 MED ORDER — AZELASTINE HCL 0.1 % NA SOLN: 2.0000 | Freq: Two times a day (BID) | NASAL | Status: DC

## 2015-06-13 NOTE — Patient Instructions (Signed)
F/u in 4 month, call if you need me sooner  Flu vaccine today  Fasting labs Dec 30 or after  Pls schedule your colonoscopy, this is past due  You are referred to urology in [place] for excess urination at night  Hope stimulator surgery goes well and you get improved control of back pain  Continue to work with your therpaist andpsychiatrist , orthopedic and pain docs  Thanks for choosing [facility], we consider it a privelige to serve you.   All the best for 2017!  New additional nose spray astelin sent in

## 2015-06-13 NOTE — Progress Notes (Signed)
Subjective:    Patient ID: Marc [name], male    DOB: 1955-12-27, 59 y.o.   MRN: [id number]  HPI Preventive Screening-Counseling & Management   Patient present here today for a Medicare annual wellness visit.   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  Yes, is currently being treated by psych  Over the past two weeks, have you felt little interest or pleasure in doing things? Yes Have you lost interest or pleasure in daily life? Yes Do you often feel hopeless? Yes Do you cry easily over simple problems? No   Activities of Daily Living  In your present state of health, do you have any difficulty performing the following activities?  Driving?: No Managing money?: No Feeding yourself?:No Getting from bed to chair?:No Climbing a flight of stairs?: Yes, depends on back pain  Preparing food and eating?:No Bathing or showering?:No Getting dressed?:No Getting to the toilet?:No Using the toilet?:No Moving around from place to place?: Yes, some limitations from back pain   Fall Risk Assessment In the past year have you fallen or had a near fall?:No Are you currently taking any medications that make you dizzys?:No   Hearing Difficulties: No Do you often ask people to speak up or repeat themselves?:No Do you experience ringing or noises in your ears?:No Do you have difficulty understanding soft or whispered voices?:No  Cognitive Testing  Alert? Yes Normal Appearance?Yes  Oriented  to person? Yes Place? Yes  Time? Yes  Displays appropriate judgment?Yes  Can read the correct time from a watch face? yes Are you having problems remembering things?No  Advanced Directives have been discussed with the patient?Yes, full code, and brochure provided    List the Names of Other Physician/Practitioners you currently use: careteams updated    Indicate any recent Medical Services you may have received from other than [facility] providers in the past year (date may be approximate).   Assessment:    Annual Wellness Exam   Plan:      Medicare Attestation  I have personally reviewed:  The patient's medical and social history  Their use of alcohol, tobacco or illicit drugs  Their current medications and supplements  The patient's functional ability including ADLs,fall risks, home safety risks, cognitive, and hearing and visual impairment  Diet and physical activities  Evidence for depression or mood disorders  The patient's weight, height, BMI, and visual acuity have been recorded in the chart. I have made referrals, counseling, and provided education to the patient based on review of the above and I have provided the patient with a written personalized care plan for preventive services.     Review of Systems     Objective:   Physical Exam  BP 130/64 mmHg  Pulse 96  Resp 18  Ht 5\' 10"  (1.778 m)  Wt 177 lb 1.3 oz (80.323 kg)  BMI 25.41 kg/m2  SpO2 97%       Assessment & Plan:  Medicare annual wellness visit,  subsequent Annual exam as documented. Counseling done  re healthy lifestyle involving commitment to 150 minutes exercise per week, heart healthy diet, and attaining healthy weight.The importance of adequate sleep also discussed. Regular seat belt use and home safety, is also discussed. Changes in health habits are decided on by the patient with goals and time frames  set for achieving them. Immunization and cancer screening needs are specifically addressed at  this visit.   Allergic rhinitis Increased and uncontrolled symptoms of nasal congestion with clear drainage and facial pressure, add astelin, continue claritin  Nocturia C/o nocturia, disturbing sleep, denies significant daytime symptoms, denies dysuria or hematuria, wishes urology eval, states stream is still good, will refer to urology per request

## 2015-06-13 NOTE — Assessment & Plan Note (Signed)

## 2015-06-16 ENCOUNTER — Encounter: Payer: Self-pay | Admitting: Family Medicine

## 2015-06-16 DIAGNOSIS — B1920 Unspecified viral hepatitis C without hepatic coma: Secondary | ICD-10-CM

## 2015-06-16 DIAGNOSIS — R351 Nocturia: Secondary | ICD-10-CM | POA: Insufficient documentation

## 2015-06-16 HISTORY — DX: Unspecified viral hepatitis C without hepatic coma: B19.20

## 2015-06-16 NOTE — Assessment & Plan Note (Signed)
C/o nocturia, disturbing sleep, denies significant daytime symptoms, denies dysuria or hematuria, wishes urology eval, states stream is still good, will refer to urology per request

## 2015-06-16 NOTE — Assessment & Plan Note (Signed)
Increased and uncontrolled symptoms of nasal congestion with clear drainage and facial pressure, add astelin, continue claritin

## 2015-06-17 ENCOUNTER — Telehealth (HOSPITAL_COMMUNITY): Payer: Self-pay | Admitting: *Deleted

## 2015-06-17 ENCOUNTER — Ambulatory Visit (HOSPITAL_COMMUNITY): Payer: Self-pay | Admitting: Psychiatry

## 2015-06-17 NOTE — Telephone Encounter (Signed)
Called pt and lmtcb to resch appt for 06-17-15 due to provider being out of office. Number provided

## 2015-07-01 ENCOUNTER — Ambulatory Visit (HOSPITAL_COMMUNITY): Payer: Self-pay | Admitting: Psychiatry

## 2015-07-01 ENCOUNTER — Other Ambulatory Visit: Payer: Self-pay | Admitting: Family Medicine

## 2015-07-03 ENCOUNTER — Ambulatory Visit (HOSPITAL_COMMUNITY): Payer: Self-pay | Admitting: Psychiatry

## 2015-07-03 ENCOUNTER — Encounter (HOSPITAL_COMMUNITY): Payer: Self-pay | Admitting: Psychiatry

## 2015-07-03 ENCOUNTER — Ambulatory Visit (INDEPENDENT_AMBULATORY_CARE_PROVIDER_SITE_OTHER): Payer: Medicare HMO | Admitting: Psychiatry

## 2015-07-03 VITALS — BP 142/88 | Ht 70.0 in | Wt 179.0 lb

## 2015-07-03 DIAGNOSIS — F1920 Other psychoactive substance dependence, uncomplicated: Secondary | ICD-10-CM | POA: Diagnosis not present

## 2015-07-03 DIAGNOSIS — F331 Major depressive disorder, recurrent, moderate: Secondary | ICD-10-CM | POA: Diagnosis not present

## 2015-07-03 MED ORDER — GABAPENTIN 400 MG PO CAPS: 400.0000 mg | ORAL_CAPSULE | Freq: Three times a day (TID) | ORAL | Status: DC

## 2015-07-03 MED ORDER — CLONAZEPAM 0.5 MG PO TABS: 0.5000 mg | ORAL_TABLET | Freq: Three times a day (TID) | ORAL | Status: DC | PRN

## 2015-07-03 MED ORDER — QUETIAPINE FUMARATE 50 MG PO TABS: ORAL_TABLET | ORAL | Status: DC

## 2015-07-03 MED ORDER — DULOXETINE HCL 60 MG PO CPEP: 60.0000 mg | ORAL_CAPSULE | Freq: Every day | ORAL | Status: DC

## 2015-07-03 NOTE — Progress Notes (Signed)
Patient ID: Marc [name], male   DOB: [birth year], 59 y.o.   MRN: [id number] Patient ID: Marc [name], male   DOB: 01/13/1956, 59 y.o.   MRN: [id number] Patient ID: Marc [name], male   DOB: [birth year], 59 y.o.   MRN: [id number] Patient ID: Marc [name], male   DOB: April 21, 1956, 59 y.o.   MRN: [id number] Patient ID: Marc [name], male   DOB: November 17, 1955, 59 y.o.   MRN: [id number] Patient ID: Marc [name], male   DOB: 11/23/1955, 59 y.o.   MRN: [id number] Patient ID: Marc [name], male   DOB: 07-09-1956, 59 y.o.   MRN: [id number] Patient ID: Marc [name], male   DOB: 30-Jun-1956, 59 y.o.   MRN: [id number] Patient ID: Marc [name], male   DOB: [birth year], 59 y.o.   MRN: [id number] Patient ID: Marc [name], male   DOB: 1956-02-03, 59 y.o.   MRN: [id number] Patient ID: Marc [name], male   DOB: Sep 15, 1955, 59 y.o.   MRN: [id number] [facility] [id number] Progress Note [name] MRN: [id number] DOB: 22-Dec-1955 Age: 59 y.o.  Date: 07/03/2015   Chief Complaint: Chief Complaint  Patient presents with  . Depression  . Anxiety  . 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 He has one son age 12 years old and lives in [place]  The patient states he has a history depression that started around 2009. Prior to that year he been treated with ribavirin to treat hepatitis C which made him very depressed. He's also had a number of deaths in his family-both sisters have died as well as both parents in the last several years. His son is very close to him and he only talks to him periodically.  Currently the patient is struggling with medical problems including neck and back pain. He recently had a neurostimulator installed in his back which is helped to some degree. Lately he's been a bit more depressed because his aunt keeps calling and wanting  to talk about all the dead relatives. He's not been seeing his counselor regularly and I think it would be helpful for him to get back into counseling. He does have friends and he walks his dog 2 miles a day. He admits to passive suicidal ideation but would never hurt himself.  The patient returns for followup after 3 months. He is still depressed and doesn't think the increase of Prozac is helped much. He has chronic pain from numerous surgeries and injuries to his back and arms and even that trigger finger in his left hand. He has never been on Cymbalta and this might be a good alternative to Prozac. He's going to try to get out more with family and friends but often pain and fatigue keep him from doing this. He denies being suicidal  .  Current psychiatric medication Prozac 20 mg bid Seroquel 50 mg 3 times a day and 2 at bedtime   Klonopin 0.5 mg 1-2 tablet  Vitamin D 50,000 IU weekly prescribed by PCP  Past psychiatric history Patient has history of depression for many years. He has taken in the past Wellbutrin Cymbalta and Celexa. He denies any history of suicidal attempt.  Alcohol and substance use history Patient has significant history of alcohol with multiple rehabilitation and detox treatment. He has history of DWI.  He also had history of taking recreational drugs and he was in the [employer]. He has history of intravenous drug use that cause hepatitis C. Patient also had history of using pain medication.  Allergies: Allergies  Allergen Reactions  . Levofloxacin Nausea Only and Other (See Comments)    "Creepy" feeling, skin didn't feel right, sort of itchy.  Baker Pierini [Lubiprostone]     MADE HIM FEEL STIFF  . Ciprofloxacin     Other reaction(s): Sweating (intolerance)  . Sulfa Antibiotics    Medical History: Past Medical History  Diagnosis Date  . Hepatitis C reactive     LIVER BX 2008-CHRONIC ACTIVE HEPATITIS  . Back problem   . GERD (gastroesophageal reflux disease)   . IBS  (irritable bowel syndrome)   . HTN (hypertension)   . Hypercholesterolemia   . Hx of adenomatous colonic polyps 2008    due for surveillance 2018  . Knee pain   . Substance abuse     narcotic addiction  . Thyroiditis     History of  . Normal echocardiogram 05/25/2011    EF 55%, mild TR  . Normal cardiac stress test 05/25/2011    [facility] [place]  . Depression   . Hepatitis C 1979  . Allergy   . Anxiety   . Arthritis   Cervical fusion Neck surgery Neck pain Hepatitis C Hypertension Benign tremors High blood pressure He sees Dr. Moshe Cipro and Dr. Paulita Cradle for pain management.  Surgical History: Past Surgical History  Procedure Laterality Date  . Appendectomy    . Tonsillectomy    . Carpal tunnel release      rt  . Right ring finger    . Spinal stenosis, had screws put in neck  04/11/2009    DR KRITZER  . Upper gastrointestinal endoscopy  2008 [facility] ABD PAIN WEIGHT LOSS d100 v8 phen 12.5    NL  . Colonoscopy  2008 [facility] ARS D100 V8 PHEN 12.5    2 SIMPLE ADENOMAS (< 1 CM)  . Knee arthroscopy    . Ulnar nerve transposition    . Esophagogastroduodenoscopy  04/26/2012    [facility]: Non-erosive gastritis (inflammation) was found in the gastric antrum but no H.pylori; multiple biopsies (duodenal bx negative for Celiac)/ The mucosa of the esophagus appeared normal  . Eus  09/08/2012    Procedure: UPPER ENDOSCOPIC ULTRASOUND (EUS) LINEAR;  Surgeon: Milus Banister, MD;  Location: [facility];  Service: Endoscopy;  Laterality: N/A;  . Neurostimulator implant    . Wrist surgery Right jan 2015    Dr. Edmonia Lynch  . Hardware removal Right 02/12/2014    Procedure: HARDWARE REMOVAL;  Surgeon: Jolyn Nap, MD;  Location: [place];  Service: Orthopedics;  Laterality: Right;  . Ulna osteotomy Right 02/12/2014    Procedure: RIGHT ULNAR SHORTENING AND OSTEOTOMY ;  Surgeon: Jolyn Nap, MD;  Location: [place];  Service: Orthopedics;  Laterality:  Right;   Family History: family history includes ADD / ADHD in his cousin; Alcohol abuse in his cousin, father, and paternal uncle; Anxiety disorder in his cousin; Bipolar disorder in his cousin; Cancer in his mother and sister; Dementia in his paternal aunt, paternal grandmother, and paternal uncle; Depression in his cousin; Emotional abuse in his sister and sister; Heart defect in an other family member; Heart failure in his father; OCD in his cousin; Paranoid behavior in his cousin; Stroke in his father and sister. There is no history of Colon  cancer, Colon polyps, Drug abuse, Schizophrenia, Seizures, Sexual abuse, or Physical abuse. Reviewed again in the office and no changes were noted except the new pain issues  Psychosocial history Patient was born and raised in [place].  He married twice. 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 Patient has multiple family member who has died in past few years. His father died in Dec 10, 2007 and mother died in 12-09-08  and 2 of his sister also died.  Patient lives by himself.  Mental status examination Patient is casually dressed and groomed.. his right arm is no longer in a cast  He maintained fair eye contact. He described his mood as still somewhat depressed   His speech is slow but clear. His thought process is slow but logical linear and goal-directed. Marland Kitchen  He denies any active or passive suicidal thinking or homicidal thinking. He denies any auditory or visual hallucination. He is alert and oriented x3.  His attention and concentration is distracted. There were no paranoia or delusions present at this time. His insight judgment and impulse control is okay. Review of systems is positive for chronic back pain Diagnosis Axis I Maj. depressive disorder, polysubstance dependence by history Axis II deferred Axis III see medical history Axis IV mild to moderate Axis V 65-70  Plan/Discussion: I took his vitals.  I reviewed  CC, tobacco/med/surg Hx, meds effects/ side effects, problem list, therapies and responses as well as current situation/symptoms discussed options. He will continue clonazepam for anxiety,  Seroquel for mood stabilization, Neurontin for anxiety. He'll discontinue Prozac and start Cymbalta 60 mg daily Continue his counseling and return in 6 weeks or call if needed sooner  See orders and pt instructions for more details.  Medical Decision Making Problem Points:  Established problem, stable/improving (1), Review of last therapy session (1) and Review of psycho-social stressors (1) Data Points:  Review or order clinical lab tests (1) Review of medication regiment & side effects (2)  I certify that outpatient services furnished can reasonably be expected to improve the patient's condition.   [name], MD

## 2015-08-01 ENCOUNTER — Encounter (HOSPITAL_COMMUNITY): Payer: Self-pay | Admitting: Psychiatry

## 2015-08-01 ENCOUNTER — Telehealth (HOSPITAL_COMMUNITY): Payer: Self-pay | Admitting: *Deleted

## 2015-08-01 ENCOUNTER — Ambulatory Visit (INDEPENDENT_AMBULATORY_CARE_PROVIDER_SITE_OTHER): Payer: Medicare HMO | Admitting: Psychiatry

## 2015-08-01 DIAGNOSIS — F331 Major depressive disorder, recurrent, moderate: Secondary | ICD-10-CM

## 2015-08-01 NOTE — Progress Notes (Signed)
       THERAPIST PROGRESS NOTE  Session Time: Thursday 08/01/2015  2:05 PM -  3:15 PM   Participation Level: Active  Behavioral Response: CasualAlert/ Anxious, Depressed  Type of Therapy: Individual Therapy  Treatment Goals addressed:    1. Learn and implement calming sills to manage anxiety response to trigger of trauma.      2. Participate in cognitive to help identify, challenge, and replace biased, negative, and self-defeating thoughts resulting from trauma  Interventions: CBT and Supportive  Summary: Marc [name] is a 59 y.o. male who initially was referred by Dr. Adele Schilder to improve coping and social skills as patient was experiencing sadness, depression, grief, and anger issues. The patient has a history of recurrent periods of depression along with a history of chemical dependency. He has had psychiatric hospitalization due to suicidal thoughts and has participated in inpatient treatment as well as outpatient treatment for chemical dependency. He completed the chemical dependency IOP program in [place] in December 2012.   Patient last was seen in October 2016. He reports increased anxiety, isolative behaviors, and depressed mood in the last 2-3 weeks.  He attributes this to not feeling well after taking a flu shot and worrying about his cousin whom he did not hear from for a couple of weeks. This time frame also is the time frame he had  traumatic experience of being in jail a year ago. Her reports flashbacks, nightmares, intrusive memories, hypervigilance, avoidant behaviors, poor concentration, and startled response. .Patient also is experiencing increased sadness regarding deceased parents and siblings triggered by the holidays. He is trying to maintain contact with his son and has gone out with friends a couple of times.. He denies suicidal ideations.   Suicidal/Homicidal: No  Therapist Response: Therapist works with patient to review symptoms,  normalize feelings of sadness  regarding deceased loved ones, provide overview of PTSD symptoms, and begin to present cognitive explanation of the development and maintenance of PTSD, review relaxation techniques, identify ways to use support system  Plan: Return again in 2 weeks.  Diagnosis: Axis I: MDD, Recurrent, Moderate    Axis II: No diagnosis    Neeraj Housand, LCSW 08/01/2015

## 2015-08-01 NOTE — Patient Instructions (Signed)
Discussed orally 

## 2015-08-01 NOTE — Telephone Encounter (Signed)
patient states that he want to switch from Cymbalta to Prozac.

## 2015-08-05 ENCOUNTER — Other Ambulatory Visit: Payer: Self-pay | Admitting: Family Medicine

## 2015-08-06 ENCOUNTER — Other Ambulatory Visit: Payer: Self-pay

## 2015-08-06 ENCOUNTER — Telehealth (HOSPITAL_COMMUNITY): Payer: Self-pay | Admitting: *Deleted

## 2015-08-06 MED ORDER — CLONAZEPAM 0.5 MG PO TABS
0.5000 mg | ORAL_TABLET | Freq: Three times a day (TID) | ORAL | Status: DC | PRN
Start: 2015-08-06 — End: 2015-08-14

## 2015-08-06 NOTE — Telephone Encounter (Signed)
lmtcb number provided 

## 2015-08-06 NOTE — Telephone Encounter (Signed)
Patient is asking if cloNIDine (CATAPRES) 0.2 MG tablet  Has been called in please advise?

## 2015-08-06 NOTE — Telephone Encounter (Signed)
lmtcb

## 2015-08-06 NOTE — Telephone Encounter (Signed)
voice message from patient, said problems with Cymbalta, making his sleep worse, more nervous, dizzy.

## 2015-08-14 ENCOUNTER — Ambulatory Visit (INDEPENDENT_AMBULATORY_CARE_PROVIDER_SITE_OTHER): Payer: Medicare HMO | Admitting: Psychiatry

## 2015-08-14 ENCOUNTER — Encounter (HOSPITAL_COMMUNITY): Payer: Self-pay | Admitting: Psychiatry

## 2015-08-14 VITALS — BP 107/76 | HR 100 | Ht 70.0 in | Wt 173.6 lb

## 2015-08-14 DIAGNOSIS — F331 Major depressive disorder, recurrent, moderate: Secondary | ICD-10-CM

## 2015-08-14 DIAGNOSIS — F1920 Other psychoactive substance dependence, uncomplicated: Secondary | ICD-10-CM | POA: Diagnosis not present

## 2015-08-14 MED ORDER — CLONAZEPAM 0.5 MG PO TABS: 0.5000 mg | ORAL_TABLET | Freq: Three times a day (TID) | ORAL | Status: DC | PRN

## 2015-08-14 MED ORDER — GABAPENTIN 400 MG PO CAPS: 400.0000 mg | ORAL_CAPSULE | Freq: Three times a day (TID) | ORAL | Status: DC

## 2015-08-14 MED ORDER — DULOXETINE HCL 60 MG PO CPEP: 60.0000 mg | ORAL_CAPSULE | Freq: Every day | ORAL | Status: DC

## 2015-08-14 MED ORDER — QUETIAPINE FUMARATE 50 MG PO TABS: ORAL_TABLET | ORAL | Status: DC

## 2015-08-14 NOTE — Telephone Encounter (Signed)
Pt came into office for f/u visit

## 2015-08-14 NOTE — Progress Notes (Signed)
Patient ID: SASCHA BONDER, male   DOB: July 30, 1956, 60 y.o.   MRN: [id number] Patient ID: MERTON OSLER, male   DOB: 1956-04-01, 60 y.o.   MRN: [id number] Patient ID: TYDUS OPDYKE, male   DOB: [birth year], 60 y.o.   MRN: [id number] Patient ID: GURSHAAN SNELLEN, male   DOB: 10-20-1955, 60 y.o.   MRN: [id number] Patient ID: DEITRICH DECLEENE, male   DOB: 11-20-1955, 60 y.o.   MRN: [id number] Patient ID: KALAI ROYES, male   DOB: 06-17-1956, 60 y.o.   MRN: [id number] Patient ID: EKANSH DITTER, male   DOB: 05-Sep-1955, 60 y.o.   MRN: [id number] Patient ID: RAKIEM FACTOR, male   DOB: Aug 29, 1955, 60 y.o.   MRN: [id number] Patient ID: SKYLOR GALLAGHER, male   DOB: [birth year], 60 y.o.   MRN: [id number] Patient ID: RANDOL BERMEL, male   DOB: August 22, 1955, 60 y.o.   MRN: [id number] Patient ID: VALDEMAR COMAN, male   DOB: 1956-03-28, 60 y.o.   MRN: [id number] Patient ID: CARLESTER DAYS, male   DOB: 1955/08/24, 60 y.o.   MRN: [id number] [facility] [id number] Progress Note DELVONTA ROOTES MRN: [id number] DOB: 12/26/1955 Age: 60 y.o.  Date: 08/14/2015   Chief Complaint: Chief Complaint  Patient presents with  . Depression  . Anxiety  . 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 He has one son age 47 years old and lives in [place]  The patient states he has a history depression that started around 2009. Prior to that year he been treated with ribavirin to treat hepatitis C which made him very depressed. He's also had a number of deaths in his family-both sisters have died as well as both parents in the last several years. His son is very close to him and he only talks to him periodically.  Currently the patient is struggling with medical problems including neck and back pain. He recently had a neurostimulator installed in his back which is helped to some degree.  Lately he's been a bit more depressed because his aunt keeps calling and wanting to talk about all the dead relatives. He's not been seeing his counselor regularly and I think it would be helpful for him to get back into counseling. He does have friends and he walks his dog 2 miles a day. He admits to passive suicidal ideation but would never hurt himself.  The patient returns for followup after 6 weeks. 60 years old  divorced white male who lives alone in [place] he retired from the city of [place] increase in Prozac did not help his mood and I switched him to Cymbalta. At first he didn't feel well on it and felt somewhat nauseous and couldn't sleep. However now he is starting to adjust to it and wants to continue with it. He does think it's helped the pain in his hand. He got depressed over the holidays because he really misses his family, many of whom are deceased. He denies suicidal ideation he slowly trying to get out and do things with people.   Past psychiatric history Patient has history of depression for many years. He has taken in the past Wellbutrin Cymbalta and Celexa. He denies any history of suicidal attempt.  Alcohol and substance use history Patient has significant history of alcohol with multiple rehabilitation and detox treatment. He has history of DWI. He also had history of taking recreational  drugs and he was in the [employer]. He has history of intravenous drug use that cause hepatitis C. Patient also had history of using pain medication.  Allergies: Allergies  Allergen Reactions  . Levofloxacin Nausea Only and Other (See Comments)    "Creepy" feeling, skin didn't feel right, sort of itchy.  Baker Pierini [Lubiprostone]     MADE HIM FEEL STIFF  . Ciprofloxacin     Other reaction(s): Sweating (intolerance)  . Sulfa Antibiotics    Medical History: Past Medical History  Diagnosis Date  . Hepatitis C reactive     LIVER BX 2008-CHRONIC ACTIVE HEPATITIS  . Back problem   . GERD (gastroesophageal reflux disease)   . IBS (irritable bowel syndrome)   .  HTN (hypertension)   . Hypercholesterolemia   . Hx of adenomatous colonic polyps 2008    due for surveillance 2018  . Knee pain   . Substance abuse     narcotic addiction  . Thyroiditis     History of  . Normal echocardiogram 05/25/2011    EF 55%, mild TR  . Normal cardiac stress test 05/25/2011    [facility] [place]  . Depression   . Hepatitis C 1979  . Allergy   . Anxiety   . Arthritis   Cervical fusion Neck surgery Neck pain Hepatitis C Hypertension Benign tremors High blood pressure He sees Dr. Moshe Cipro and Dr. Paulita Cradle for pain management.  Surgical History: Past Surgical History  Procedure Laterality Date  . Appendectomy    . Tonsillectomy    . Carpal tunnel release      rt  . Right ring finger    . Spinal stenosis, had screws put in neck  04/11/2009    DR KRITZER  . Upper gastrointestinal endoscopy  2008 [facility] ABD PAIN WEIGHT LOSS d100 v8 phen 12.5    NL  . Colonoscopy  2008 [facility] ARS D100 V8 PHEN 12.5    2 SIMPLE ADENOMAS (< 1 CM)  . Knee arthroscopy    . Ulnar nerve transposition    . Esophagogastroduodenoscopy  04/26/2012    [facility]: Non-erosive gastritis (inflammation) was found in the gastric antrum but no H.pylori; multiple biopsies (duodenal bx negative for Celiac)/ The mucosa of the esophagus appeared normal  . Eus  09/08/2012    Procedure: UPPER ENDOSCOPIC ULTRASOUND (EUS) LINEAR;  Surgeon: Milus Banister, MD;  Location: [facility];  Service: Endoscopy;  Laterality: N/A;  . Neurostimulator implant    . Wrist surgery Right jan 2015    Dr. Edmonia Lynch  . Hardware removal Right 02/12/2014    Procedure: HARDWARE REMOVAL;  Surgeon: Jolyn Nap, MD;  Location: [place];  Service: Orthopedics;  Laterality: Right;  . Ulna osteotomy Right 02/12/2014    Procedure: RIGHT ULNAR SHORTENING AND OSTEOTOMY ;  Surgeon: Jolyn Nap, MD;  Location: [place];  Service: Orthopedics;  Laterality: Right;   Family  History: family history includes ADD / ADHD in his cousin; Alcohol abuse in his cousin, father, and paternal uncle; Anxiety disorder in his cousin; Bipolar disorder in his cousin; Cancer in his mother and sister; Dementia in his paternal aunt, paternal grandmother, and paternal uncle; Depression in his cousin; Emotional abuse in his sister and sister; Heart failure in his father; OCD in his cousin; Paranoid behavior in his cousin; Stroke in his father and sister. There is no history of Colon cancer, Colon polyps, Drug abuse, Schizophrenia, Seizures, Sexual abuse, or Physical abuse. Reviewed again  in the office and no changes were noted except the new pain issues  Psychosocial history Patient was born and raised in [place].  He married twice. 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 Patient has multiple family member who has died in past few years. His father died in 11/21/2007 and mother died in 2008/11/20  and 2 of his sister also died.  Patient lives by himself.  Mental status examination Patient is casually dressed and groomed.Marland KitchenHe maintained fair eye contact. He described his mood as still somewhat depressed   His speech is slow but clear. His thought process is slow but logical linear and goal-directed. Marland Kitchen  He denies any active or passive suicidal thinking or homicidal thinking. He denies any auditory or visual hallucination. He is alert and oriented x3.  His attention and concentration is distracted. There were no paranoia or delusions present at this time. His insight judgment and impulse control is okay. Review of systems is positive for chronic back pain and pain in his left hand Diagnosis Axis I Maj. depressive disorder, polysubstance dependence by history Axis II deferred Axis III see medical history Axis IV mild to moderate Axis V 65-70  Plan/Discussion: I took his vitals.  I reviewed CC, tobacco/med/surg Hx, meds effects/ side effects, problem list,  therapies and responses as well as current situation/symptoms discussed options. He will continue clonazepam for anxiety,  Seroquel for mood stabilization, Neurontin for anxiety. He'll continue Cymbalta 60 mg daily Continue his counseling and return in 6 weeks or call if needed sooner  See orders and pt instructions for more details.  Medical Decision Making Problem Points:  Established problem, stable/improving (1), Review of last therapy session (1) and Review of psycho-social stressors (1) Data Points:  Review or order clinical lab tests (1) Review of medication regiment & side effects (2)  I certify that outpatient services furnished can reasonably be expected to improve the patient's condition.   [name], MD

## 2015-08-14 NOTE — Telephone Encounter (Signed)
Pt came into office for f/u 

## 2015-08-22 ENCOUNTER — Ambulatory Visit (HOSPITAL_COMMUNITY): Payer: Self-pay | Admitting: Psychiatry

## 2015-09-11 ENCOUNTER — Other Ambulatory Visit: Payer: Self-pay | Admitting: Family Medicine

## 2015-09-12 ENCOUNTER — Ambulatory Visit (HOSPITAL_COMMUNITY): Payer: Self-pay | Admitting: Psychiatry

## 2015-09-25 ENCOUNTER — Ambulatory Visit (INDEPENDENT_AMBULATORY_CARE_PROVIDER_SITE_OTHER): Payer: Medicare HMO | Admitting: Psychiatry

## 2015-09-25 ENCOUNTER — Encounter (HOSPITAL_COMMUNITY): Payer: Self-pay | Admitting: Psychiatry

## 2015-09-25 VITALS — BP 159/89 | HR 81 | Ht 70.0 in | Wt 167.8 lb

## 2015-09-25 DIAGNOSIS — F1920 Other psychoactive substance dependence, uncomplicated: Secondary | ICD-10-CM

## 2015-09-25 DIAGNOSIS — F331 Major depressive disorder, recurrent, moderate: Secondary | ICD-10-CM

## 2015-09-25 MED ORDER — QUETIAPINE FUMARATE 50 MG PO TABS: ORAL_TABLET | ORAL | Status: DC

## 2015-09-25 MED ORDER — CLONAZEPAM 0.5 MG PO TABS
0.5000 mg | ORAL_TABLET | Freq: Three times a day (TID) | ORAL | Status: DC | PRN
Start: 2015-09-25 — End: 2015-12-23

## 2015-09-25 MED ORDER — FLUOXETINE HCL 20 MG PO CAPS: 20.0000 mg | ORAL_CAPSULE | Freq: Every day | ORAL | Status: DC

## 2015-09-25 MED ORDER — GABAPENTIN 400 MG PO CAPS: 400.0000 mg | ORAL_CAPSULE | Freq: Three times a day (TID) | ORAL | Status: DC

## 2015-09-25 NOTE — Progress Notes (Signed)
Patient ID: Marc [name], male   DOB: 09-Sep-1955, 60 y.o.   MRN: [id number] Patient ID: Marc [name], male   DOB: 1956/02/10, 60 y.o.   MRN: [id number] Patient ID: Marc [name], male   DOB: [birth year], 60 y.o.   MRN: [id number] Patient ID: Marc [name], male   DOB: 03/15/1956, 60 y.o.   MRN: [id number] Patient ID: Marc [name], male   DOB: 10/22/1955, 60 y.o.   MRN: [id number] Patient ID: Marc [name], male   DOB: Feb 21, 1956, 60 y.o.   MRN: [id number] Patient ID: Marc [name], male   DOB: Oct 17, 1955, 60 y.o.   MRN: [id number] Patient ID: Marc [name], male   DOB: 09-21-1955, 60 y.o.   MRN: [id number] Patient ID: Marc [name], male   DOB: July 22, 1956, 60 y.o.   MRN: [id number] Patient ID: Marc [name], male   DOB: 04-13-1956, 60 y.o.   MRN: [id number] Patient ID: Marc [name], male   DOB: [birth year], 60 y.o.   MRN: [id number] Patient ID: Marc [name], male   DOB: 07/27/1956, 60 y.o.   MRN: [id number] Patient ID: Marc [name], male   DOB: 04/16/1956, 60 y.o.   MRN: [id number] [facility] [id number] Progress Note Marc [name] MRN: [id number] DOB: [birth year] Age: 60 y.o.  Date: 09/25/2015   Chief Complaint: Chief Complaint  Patient presents with  . Depression  . Anxiety  . 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 He has one son age 16 years old and lives in [place]  The patient states he has a history depression that started around 2009. Prior to that year he been treated with ribavirin to treat hepatitis C which made him very depressed. He's also had a number of deaths in his family-both sisters have died as well as both parents in the last several years. His son is very close to him and he only talks to him periodically.  Currently the patient is struggling with medical problems including neck and back pain. He recently had  a neurostimulator installed in his back which is helped to some degree. Lately he's been a bit more depressed because his aunt keeps calling and wanting to talk about all the dead relatives. He's not been seeing his counselor regularly and I think it would be helpful for him to get back into counseling. He does have friends and he walks his dog 2 miles a day. He admits to passive suicidal ideation but would never hurt himself.  The patient returns for followup after 2 months. He states that lately he's been more sad and thinking about family members who have died. He doesn't think the Cymbalta is working as well as he would like. He also needs more treatment for hepatitis C. Overall he thinks he did better on Prozac.   Past psychiatric history Patient has history of depression for many years. He has taken in the past Wellbutrin Cymbalta and Celexa. He denies any history of suicidal attempt.  Alcohol and substance use history Patient has significant history of alcohol with multiple rehabilitation and detox treatment. He has history of DWI. He also had history of taking recreational drugs and he was in the [employer]. He has history of intravenous drug use that cause hepatitis C. Patient also had history of using pain medication.  Allergies: Allergies  Allergen Reactions  . Levofloxacin Nausea Only and Other (See Comments)    "Creepy" feeling, skin didn't feel right, sort of itchy.  Baker Pierini [Lubiprostone]     MADE HIM FEEL STIFF  . Ciprofloxacin     Other reaction(s): Sweating (intolerance)  . Sulfa Antibiotics    Medical History: Past Medical History  Diagnosis Date  . Hepatitis C reactive     LIVER BX 2008-CHRONIC ACTIVE HEPATITIS  . Back problem   . GERD (gastroesophageal reflux disease)   . IBS (irritable bowel syndrome)   . HTN (hypertension)   . Hypercholesterolemia   . Hx of adenomatous colonic polyps 2008    due for surveillance 2018  . Knee pain   . Substance abuse     narcotic  addiction  . Thyroiditis     History of  . Normal echocardiogram 05/25/2011    EF 55%, mild TR  . Normal cardiac stress test 05/25/2011    [facility] [place]  . Depression   . Hepatitis C 1979  . Allergy   . Anxiety   . Arthritis   Cervical fusion Neck surgery Neck pain Hepatitis C Hypertension Benign tremors High blood pressure He sees Dr. Moshe Cipro and Dr. Paulita Cradle for pain management.  Surgical History: Past Surgical History  Procedure Laterality Date  . Appendectomy    . Tonsillectomy    . Carpal tunnel release      rt  . Right ring finger    . Spinal stenosis, had screws put in neck  04/11/2009    DR KRITZER  . Upper gastrointestinal endoscopy  2008 [facility] ABD PAIN WEIGHT LOSS d100 v8 phen 12.5    NL  . Colonoscopy  2008 [facility] ARS D100 V8 PHEN 12.5    2 SIMPLE ADENOMAS (< 1 CM)  . Knee arthroscopy    . Ulnar nerve transposition    . Esophagogastroduodenoscopy  04/26/2012    [facility]: Non-erosive gastritis (inflammation) was found in the gastric antrum but no H.pylori; multiple biopsies (duodenal bx negative for Celiac)/ The mucosa of the esophagus appeared normal  . Eus  09/08/2012    Procedure: UPPER ENDOSCOPIC ULTRASOUND (EUS) LINEAR;  Surgeon: Milus Banister, MD;  Location: [facility];  Service: Endoscopy;  Laterality: N/A;  . Neurostimulator implant    . Wrist surgery Right jan 2015    Dr. Edmonia Lynch  . Hardware removal Right 02/12/2014    Procedure: HARDWARE REMOVAL;  Surgeon: Jolyn Nap, MD;  Location: [facility];  Service: Orthopedics;  Laterality: Right;  . Ulna osteotomy Right 02/12/2014    Procedure: RIGHT ULNAR SHORTENING AND OSTEOTOMY ;  Surgeon: Jolyn Nap, MD;  Location: [facility];  Service: Orthopedics;  Laterality: Right;   Family History: family history includes ADD / ADHD in his cousin; Alcohol abuse in his cousin, father, and paternal uncle; Anxiety disorder in his cousin; Bipolar disorder in his cousin;  Cancer in his mother and sister; Dementia in his paternal aunt, paternal grandmother, and paternal uncle; Depression in his cousin; Emotional abuse in his sister and sister; Heart failure in his father; OCD in his cousin; Paranoid behavior in his cousin; Stroke in his father and sister. There is no history of Colon cancer, Colon polyps, Drug abuse, Schizophrenia, Seizures, Sexual abuse, or Physical abuse. Reviewed again in the office and no changes were noted except the new pain issues  Psychosocial history Patient was born and raised in [place].  He married twice. 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 Patient has multiple family member who has died in past few years. His father died in November 13, 2007 and mother died in 11/12/08  and 2 of his sister also died.  Patient lives by himself.  Mental status examination Patient is casually dressed and groomed.Marland KitchenHe maintained fair eye contact. He described his mood as still somewhat depressed   His speech is slow but clear. His thought process is slow but logical linear and goal-directed. Marland Kitchen  He denies any active or passive suicidal thinking or homicidal thinking. He denies any auditory or visual hallucination. He is alert and oriented x3.  His attention and concentration is distracted. There were no paranoia or delusions present at this time. His insight judgment and impulse control is okay. Review of systems is positive for chronic back pain and pain in his left hand Diagnosis Axis I Maj. depressive disorder, polysubstance dependence by history Axis II deferred Axis III see medical history Axis IV mild to moderate Axis V 65-70  Plan/Discussion: I took his vitals.  I reviewed CC, tobacco/med/surg Hx, meds effects/ side effects, problem list, therapies and responses as well as current situation/symptoms discussed options. He will continue clonazepam for anxiety,  Seroquel for mood stabilization, Neurontin for anxiety. He'll  discontinue Cymbalta 60 and restart Prozac 20 mg daily mg daily Continue his counseling and return in 2 months or call if needed sooner  See orders and pt instructions for more details.  Medical Decision Making Problem Points:  Established problem, stable/improving (1), Review of last therapy session (1) and Review of psycho-social stressors (1) Data Points:  Review or order clinical lab tests (1) Review of medication regiment & side effects (2)  I certify that outpatient services furnished can reasonably be expected to improve the patient's condition.   [name], MD

## 2015-09-30 ENCOUNTER — Other Ambulatory Visit: Payer: Self-pay | Admitting: Family Medicine

## 2015-10-01 ENCOUNTER — Ambulatory Visit (HOSPITAL_COMMUNITY): Payer: Self-pay | Admitting: Psychiatry

## 2015-10-09 ENCOUNTER — Ambulatory Visit: Payer: Self-pay | Admitting: Gastroenterology

## 2015-10-14 ENCOUNTER — Ambulatory Visit: Payer: Self-pay | Admitting: Family Medicine

## 2015-10-16 ENCOUNTER — Ambulatory Visit: Payer: Self-pay | Admitting: Urology

## 2015-10-21 ENCOUNTER — Ambulatory Visit (INDEPENDENT_AMBULATORY_CARE_PROVIDER_SITE_OTHER): Payer: Medicare HMO | Admitting: Family Medicine

## 2015-10-21 ENCOUNTER — Encounter: Payer: Self-pay | Admitting: Family Medicine

## 2015-10-21 VITALS — BP 130/84 | HR 102 | Resp 16 | Ht 70.0 in | Wt 170.0 lb

## 2015-10-21 DIAGNOSIS — K219 Gastro-esophageal reflux disease without esophagitis: Secondary | ICD-10-CM

## 2015-10-21 DIAGNOSIS — J3089 Other allergic rhinitis: Secondary | ICD-10-CM

## 2015-10-21 DIAGNOSIS — F329 Major depressive disorder, single episode, unspecified: Secondary | ICD-10-CM

## 2015-10-21 DIAGNOSIS — I10 Essential (primary) hypertension: Secondary | ICD-10-CM | POA: Diagnosis not present

## 2015-10-21 DIAGNOSIS — M62830 Muscle spasm of back: Secondary | ICD-10-CM

## 2015-10-21 DIAGNOSIS — R7303 Prediabetes: Secondary | ICD-10-CM | POA: Diagnosis not present

## 2015-10-21 DIAGNOSIS — F419 Anxiety disorder, unspecified: Secondary | ICD-10-CM

## 2015-10-21 DIAGNOSIS — M549 Dorsalgia, unspecified: Secondary | ICD-10-CM | POA: Diagnosis not present

## 2015-10-21 DIAGNOSIS — F418 Other specified anxiety disorders: Secondary | ICD-10-CM

## 2015-10-21 MED ORDER — CYCLOBENZAPRINE HCL 10 MG PO TABS: 10.0000 mg | ORAL_TABLET | Freq: Every day | ORAL | Status: DC

## 2015-10-21 NOTE — Assessment & Plan Note (Signed)
States he is unable to afford robaxin, will try flexeril

## 2015-10-21 NOTE — Patient Instructions (Addendum)
Annual physical exam in 4.5 month, call if you need me sooner  Change from robaxin to flexeril as discussed  I will refer you to alternate pain Doc who accepts your insurance  Labs today  Thanks for choosing [facility], we consider it a privelige to serve you.

## 2015-10-21 NOTE — Progress Notes (Signed)
Subjective:    Patient ID: Marc [name], male    DOB: 1955-10-11, 60 y.o.   MRN: [id number]  HPI   Marc [name]     MRN: [id number]      DOB: September 01, 1955   HPI [name] is here for follow up and re-evaluation of chronic medical conditions, medication management and review of any available recent lab and radiology data.  Preventive health is updated, specifically  Cancer screening and Immunization.   Questions or concerns regarding consultations or procedures which the PT has had in the interim are  addressed. The PT denies any adverse reactions to current medications since the last visit. Medication cost is an issue, I will need to change robaxin, and will try flexeril. He will discuss Cymbalta with psych Needs to identify new Pain Doc due to insurance coverage / lack thereof as far as current Provider is concerned. With a nerve stimulator in place and a h/o narcotic med addiction, it is important that he see pain Specialist  ROS Denies recent fever or chills. Denies sinus pressure, nasal congestion, ear pain or sore throat. Denies chest congestion, productive cough or wheezing. Denies chest pains, palpitations and leg swelling Denies abdominal pain, nausea, vomiting,diarrhea or constipation.   Denies dysuria, frequency, hesitancy or incontinence.  Denies headaches, seizures, numbness, or tingling. Denies uncontrolled  depression, anxiety or insomnia. Denies skin break down or rash.   PE  BP 130/84 mmHg  Pulse 102  Resp 16  Ht 5\' 10"  (1.778 m)  Wt 170 lb (77.111 kg)  BMI 24.39 kg/m2  SpO2 99%  Patient alert and oriented and in no cardiopulmonary distress.  HEENT: No facial asymmetry, EOMI,   oropharynx pink and moist.  Neck supple no JVD, no mass.  Chest: Clear to auscultation bilaterally.  CVS: S1, S2 no murmurs, no S3.Regular rate.  ABD: Soft non tender.   Ext: No edema  MS: Adequate though reduced  ROM spine, shoulders, hips and knees.  Skin: Intact, no  ulcerations or rash noted.  Psych: Good eye contact, normal affect. Memory intact not anxious or depressed appearing.  CNS: CN 2-12 intact, power,  normal throughout.no focal deficits noted.   Assessment & Plan  Back muscle spasm States he is unable to afford robaxin, will try flexeril  Back pain with radiation In chronic pain management for years with a personal h/o prescription drug addiciton, Has nerve stimulator in place. vberage/ lack, will seek Provider in his network  Prediabetes Patient educated about the importance of limiting  Carbohydrate intake , the need to commit to daily physical activity for a minimum of 30 minutes , and to commit weight loss. The fact that changes in all these areas will reduce or eliminate all together the development of diabetes is stressed.  Improved, now normal, excellent  Diabetic Labs Latest Ref Rng 10/21/2015 12/27/2014 08/07/2014 01/25/2014 06/29/2013  HbA1c <5.7 % 5.5 5.8(H) 5.9(H) - 5.4  Chol 125 - 200 mg/dL 171 171 153 - 193  HDL >=40 mg/dL 51 58 36(L) - 66  Calc LDL <130 mg/dL 100 97 103(H) - 112(H)  Triglycerides <150 mg/dL 99 81 71 - 77  Creatinine 0.70 - 1.25 mg/dL 0.92 1.15 0.99 1.10 1.02   BP/Weight 10/21/2015 06/13/2015 12/27/2014 08/28/2014 08/08/2014 06/12/2014 [id number]  Systolic BP [id number] 123456 [id number] 123456 [id number]  Diastolic BP 84 64 78 72 78 82 67  Wt. (Lbs) 170 177.08 170.12 - 170.12 168.04 -  BMI 24.39 25.41 24.41 - 24.41 22.79 -  Some encounter information is confidential and restricted. Go to Review Flowsheets activity to see all data.   No flowsheet data found.     Essential hypertension Controlled, no change in medication DASH diet and commitment to daily physical activity for a minimum of 30 minutes discussed and encouraged, as a part of hypertension management. The importance of attaining a healthy weight is also discussed.  BP/Weight 10/21/2015 06/13/2015 12/27/2014 08/28/2014 08/08/2014 06/12/2014 [id number]  Systolic BP  [id number] 123456 [id number] 123456 [id number]  Diastolic BP 84 64 78 72 78 82 67  Wt. (Lbs) 170 177.08 170.12 - 170.12 168.04 -  BMI 24.39 25.41 24.41 - 24.41 22.79 -  Some encounter information is confidential and restricted. Go to Review Flowsheets activity to see all data.        Anxiety and depression Well managed by psychiatry and therapist  GERD Controlled, no change in medication   Allergic rhinitis Controlled, no change in medication         Review of Systems     Objective:   Physical Exam        Assessment & Plan:

## 2015-10-22 ENCOUNTER — Telehealth (HOSPITAL_COMMUNITY): Payer: Self-pay | Admitting: *Deleted

## 2015-10-22 LAB — COMPREHENSIVE METABOLIC PANEL
ALT: 131 U/L — AB (ref 9–46)
AST: 113 U/L — AB (ref 10–35)
Albumin: 3.8 g/dL (ref 3.6–5.1)
Alkaline Phosphatase: 66 U/L (ref 40–115)
BILIRUBIN TOTAL: 0.6 mg/dL (ref 0.2–1.2)
BUN: 16 mg/dL (ref 7–25)
CO2: 29 mmol/L (ref 20–31)
CREATININE: 0.92 mg/dL (ref 0.70–1.25)
Calcium: 9.1 mg/dL (ref 8.6–10.3)
Chloride: 107 mmol/L (ref 98–110)
Glucose, Bld: 110 mg/dL — ABNORMAL HIGH (ref 65–99)
Potassium: 4.7 mmol/L (ref 3.5–5.3)
SODIUM: 141 mmol/L (ref 135–146)
TOTAL PROTEIN: 6.7 g/dL (ref 6.1–8.1)

## 2015-10-22 LAB — TSH: TSH: 1.29 mIU/L (ref 0.40–4.50)

## 2015-10-22 LAB — LIPID PANEL
CHOLESTEROL: 171 mg/dL (ref 125–200)
HDL: 51 mg/dL (ref >=40)
LDL Cholesterol: 100 mg/dL (ref <130)
Total CHOL/HDL Ratio: 3.4 Ratio (ref <=5.0)
Triglycerides: 99 mg/dL (ref <150)
VLDL: 20 mg/dL (ref <30)

## 2015-10-22 LAB — CBC
HEMATOCRIT: 39.5 % (ref 39.0–52.0)
HEMOGLOBIN: 13.3 g/dL (ref 13.0–17.0)
MCH: 33.5 pg (ref 26.0–34.0)
MCHC: 33.7 g/dL (ref 30.0–36.0)
MCV: 99.5 fL (ref 78.0–100.0)
MPV: 11 fL (ref 8.6–12.4)
Platelets: 105 10*3/uL — ABNORMAL LOW (ref 150–400)
RBC: 3.97 MIL/uL — AB (ref 4.22–5.81)
RDW: 15.3 % (ref 11.5–15.5)
WBC: 3 10*3/uL — ABNORMAL LOW (ref 4.0–10.5)

## 2015-10-22 LAB — HEMOGLOBIN A1C
Hgb A1c MFr Bld: 5.5 % (ref <5.7)
MEAN PLASMA GLUCOSE: 111 mg/dL (ref <117)

## 2015-10-22 LAB — PSA, MEDICARE: PSA: 0.38 ng/mL (ref <=4.00)

## 2015-10-23 ENCOUNTER — Other Ambulatory Visit: Payer: Self-pay | Admitting: Family Medicine

## 2015-10-23 DIAGNOSIS — D696 Thrombocytopenia, unspecified: Secondary | ICD-10-CM

## 2015-10-23 DIAGNOSIS — D72819 Decreased white blood cell count, unspecified: Secondary | ICD-10-CM

## 2015-10-23 NOTE — Assessment & Plan Note (Signed)
Controlled, no change in medication DASH diet and commitment to daily physical activity for a minimum of 30 minutes discussed and encouraged, as a part of hypertension management. The importance of attaining a healthy weight is also discussed.  BP/Weight 10/21/2015 06/13/2015 12/27/2014 08/28/2014 08/08/2014 06/12/2014 [id number]  Systolic BP [id number] [id number] 123456 [id number] 123456 [id number]  Diastolic BP 84 64 78 72 78 82 67  Wt. (Lbs) 170 177.08 170.12 - 170.12 168.04 -  BMI 24.39 25.41 24.41 - 24.41 22.79 -  Some encounter information is confidential and restricted. Go to Review Flowsheets activity to see all data.

## 2015-10-23 NOTE — Assessment & Plan Note (Signed)
Patient educated about the importance of limiting  Carbohydrate intake , the need to commit to daily physical activity for a minimum of 30 minutes , and to commit weight loss. The fact that changes in all these areas will reduce or eliminate all together the development of diabetes is stressed.  Improved, now normal, excellent  Diabetic Labs Latest Ref Rng 10/21/2015 12/27/2014 08/07/2014 01/25/2014 06/29/2013  HbA1c <5.7 % 5.5 5.8(H) 5.9(H) - 5.4  Chol 125 - 200 mg/dL 171 171 153 - 193  HDL >=40 mg/dL 51 58 36(L) - 66  Calc LDL <130 mg/dL 100 97 103(H) - 112(H)  Triglycerides <150 mg/dL 99 81 71 - 77  Creatinine 0.70 - 1.25 mg/dL 0.92 1.15 0.99 1.10 1.02   BP/Weight 10/21/2015 06/13/2015 12/27/2014 08/28/2014 08/08/2014 06/12/2014 [id number]  Systolic BP [id number] [id number] 123456 [id number] 123456 [id number]  Diastolic BP 84 64 78 72 78 82 67  Wt. (Lbs) 170 177.08 170.12 - 170.12 168.04 -  BMI 24.39 25.41 24.41 - 24.41 22.79 -  Some encounter information is confidential and restricted. Go to Review Flowsheets activity to see all data.   No flowsheet data found.

## 2015-10-23 NOTE — Assessment & Plan Note (Signed)
Well managed by psychiatry and therapist

## 2015-10-23 NOTE — Assessment & Plan Note (Signed)
Controlled, no change in medication  

## 2015-10-23 NOTE — Assessment & Plan Note (Signed)
In chronic pain management for years with a personal h/o prescription drug addiciton, Has nerve stimulator in place. vberage/ lack, will seek Provider in his network

## 2015-10-29 ENCOUNTER — Ambulatory Visit: Payer: Self-pay | Admitting: Gastroenterology

## 2015-10-29 ENCOUNTER — Encounter: Payer: Self-pay | Admitting: Gastroenterology

## 2015-11-06 ENCOUNTER — Encounter (HOSPITAL_COMMUNITY): Payer: Medicare HMO

## 2015-11-06 ENCOUNTER — Encounter (HOSPITAL_COMMUNITY): Payer: Medicare HMO | Attending: Oncology | Admitting: Oncology

## 2015-11-06 ENCOUNTER — Encounter (HOSPITAL_COMMUNITY): Payer: Self-pay | Admitting: Oncology

## 2015-11-06 VITALS — BP 113/72 | HR 79 | Resp 18 | Wt 165.7 lb

## 2015-11-06 DIAGNOSIS — B199 Unspecified viral hepatitis without hepatic coma: Secondary | ICD-10-CM | POA: Diagnosis not present

## 2015-11-06 DIAGNOSIS — Z808 Family history of malignant neoplasm of other organs or systems: Secondary | ICD-10-CM

## 2015-11-06 DIAGNOSIS — D72819 Decreased white blood cell count, unspecified: Secondary | ICD-10-CM | POA: Diagnosis not present

## 2015-11-06 DIAGNOSIS — D696 Thrombocytopenia, unspecified: Secondary | ICD-10-CM | POA: Insufficient documentation

## 2015-11-06 DIAGNOSIS — I10 Essential (primary) hypertension: Secondary | ICD-10-CM | POA: Insufficient documentation

## 2015-11-06 DIAGNOSIS — Z809 Family history of malignant neoplasm, unspecified: Secondary | ICD-10-CM | POA: Insufficient documentation

## 2015-11-06 DIAGNOSIS — Z87891 Personal history of nicotine dependence: Secondary | ICD-10-CM

## 2015-11-06 DIAGNOSIS — K219 Gastro-esophageal reflux disease without esophagitis: Secondary | ICD-10-CM | POA: Diagnosis not present

## 2015-11-06 DIAGNOSIS — B1920 Unspecified viral hepatitis C without hepatic coma: Secondary | ICD-10-CM | POA: Insufficient documentation

## 2015-11-06 DIAGNOSIS — Z9889 Other specified postprocedural states: Secondary | ICD-10-CM | POA: Diagnosis not present

## 2015-11-06 DIAGNOSIS — Z79899 Other long term (current) drug therapy: Secondary | ICD-10-CM | POA: Insufficient documentation

## 2015-11-06 DIAGNOSIS — Z818 Family history of other mental and behavioral disorders: Secondary | ICD-10-CM

## 2015-11-06 DIAGNOSIS — Z8052 Family history of malignant neoplasm of bladder: Secondary | ICD-10-CM

## 2015-11-06 DIAGNOSIS — E7800 Pure hypercholesterolemia, unspecified: Secondary | ICD-10-CM | POA: Insufficient documentation

## 2015-11-06 DIAGNOSIS — Z888 Allergy status to other drugs, medicaments and biological substances status: Secondary | ICD-10-CM | POA: Insufficient documentation

## 2015-11-06 HISTORY — DX: Decreased white blood cell count, unspecified: D72.819

## 2015-11-06 HISTORY — DX: Thrombocytopenia, unspecified: D69.6

## 2015-11-06 LAB — CBC WITH DIFFERENTIAL/PLATELET
BASOS PCT: 0 %
Basophils Absolute: 0 10*3/uL (ref 0.0–0.1)
Eosinophils Absolute: 0.1 10*3/uL (ref 0.0–0.7)
Eosinophils Relative: 2 %
HCT: 37.3 % — ABNORMAL LOW (ref 39.0–52.0)
HEMOGLOBIN: 12.9 g/dL — AB (ref 13.0–17.0)
LYMPHS ABS: 1.2 10*3/uL (ref 0.7–4.0)
Lymphocytes Relative: 52 %
MCH: 34.9 pg — AB (ref 26.0–34.0)
MCHC: 34.6 g/dL (ref 30.0–36.0)
MCV: 100.8 fL — ABNORMAL HIGH (ref 78.0–100.0)
MONOS PCT: 9 %
Monocytes Absolute: 0.2 10*3/uL (ref 0.1–1.0)
NEUTROS ABS: 0.9 10*3/uL — AB (ref 1.7–7.7)
NEUTROS PCT: 37 %
Platelets: 80 10*3/uL — ABNORMAL LOW (ref 150–400)
RBC: 3.7 MIL/uL — ABNORMAL LOW (ref 4.22–5.81)
RDW: 13.2 % (ref 11.5–15.5)
WBC: 2.3 10*3/uL — ABNORMAL LOW (ref 4.0–10.5)

## 2015-11-06 LAB — IRON AND TIBC
Iron: 136 ug/dL (ref 45–182)
SATURATION RATIOS: 43 % — AB (ref 17.9–39.5)
TIBC: 319 ug/dL (ref 250–450)
UIBC: 183 ug/dL

## 2015-11-06 LAB — COMPREHENSIVE METABOLIC PANEL
ALK PHOS: 72 U/L (ref 38–126)
ALT: 120 U/L — AB (ref 17–63)
AST: 126 U/L — AB (ref 15–41)
Albumin: 3.9 g/dL (ref 3.5–5.0)
Anion gap: 8 (ref 5–15)
BUN: 17 mg/dL (ref 6–20)
CALCIUM: 8.9 mg/dL (ref 8.9–10.3)
CO2: 26 mmol/L (ref 22–32)
CREATININE: 0.99 mg/dL (ref 0.61–1.24)
Chloride: 103 mmol/L (ref 101–111)
GFR calc non Af Amer: >60 mL/min (ref >60)
GLUCOSE: 116 mg/dL — AB (ref 65–99)
Potassium: 3.9 mmol/L (ref 3.5–5.1)
SODIUM: 137 mmol/L (ref 135–145)
Total Bilirubin: 0.8 mg/dL (ref 0.3–1.2)
Total Protein: 7.4 g/dL (ref 6.5–8.1)

## 2015-11-06 LAB — SEDIMENTATION RATE: Sed Rate: 14 mm/hr (ref 0–16)

## 2015-11-06 LAB — FERRITIN: Ferritin: 714 ng/mL — ABNORMAL HIGH (ref 24–336)

## 2015-11-06 LAB — VITAMIN B12: Vitamin B-12: 462 pg/mL (ref 180–914)

## 2015-11-06 LAB — FOLATE: FOLATE: 13.8 ng/mL (ref >5.9)

## 2015-11-06 LAB — C-REACTIVE PROTEIN: CRP: <0.5 mg/dL (ref <1.0)

## 2015-11-06 NOTE — Assessment & Plan Note (Addendum)
Leukopenia without known predominance of particular WBC line due to lack of differentials available to me.  Stable dating back to at least 2008.  Reported history of Hepatitis.  Labs today: CBC diff, CMET, pathologist smear review, hepatitis panel, hepatitis B surface antigen, HIV antibody, ESR, CRP, ANA, B12, Folate, iron/TIBC, ferritin, IgG, IgA, IgM.  Given his hepatitis history and recent liver test results, we must be suspicious of hepatitis/hepatitis-induced cirrhosis.  Both can result in lab abnormalities.    [name] of liver to evaluate for cirrhosis and [name] of spleen to evaluate for splenomegaly.  He will return in 2-3 weeks for follow-up.

## 2015-11-06 NOTE — Assessment & Plan Note (Addendum)
Thrombocytopenia, noted in 2013 but on last two blood checks noted in December 2015 and March 2017 with most recent platelet count being 105,000.  In his chart, he is noted to have hepatitis, chronic.  He reports that he was "cured" in 2009 but told he relapsed.  He reports "relapse" in 2009-2010 that was treated with interferon at [facility] but discontinued early due to "complications."  I do not see any recent testing for Hepatitis.  With recent lab abnormalities in liver enzymes, we are suspicious for hepatic abnormality +/- hepatitis.  Labs today: CBC diff, CMET, pathologist smear review, hepatitis panel, hepatitis B surface antigen, HIV antibody, ESR, CRP, ANA, B12, Folate, iron/TIBC, ferritin, IgG, IgA, IgM.  [name] of spleen and liver to evaluate for cirrhosis of liver and splenomegaly.

## 2015-11-06 NOTE — Patient Instructions (Addendum)
[place] at [facility] Discharge Instructions  RECOMMENDATIONS MADE BY THE CONSULTANT AND ANY TEST RESULTS WILL BE SENT TO YOUR REFERRING PHYSICIAN.  Exam done and seen today by Kirby Crigler Your here today due to your WBC count being low. Looked stable over the past few years.  Get labs today. Ultra sound in 2-3 weeks Return to see the Doctor in 2-3weeks Call the clinic for any concerns or questions.   Thank you for choosing [place] at [facility] to provide your oncology and hematology care.  To afford each patient quality time with our provider, please arrive at least 15 minutes before your scheduled appointment time.   Beginning January 23rd 2017 lab work for the [facility] will be done in the  Main lab at [facility] on 1st floor. If you have a lab appointment with the [facility] please come in thru the  Main Entrance and check in at the main information desk  You need to re-schedule your appointment should you arrive 10 or more minutes late.  We strive to give you quality time with our providers, and arriving late affects you and other patients whose appointments are after yours.  Also, if you no show three or more times for appointments you may be dismissed from the clinic at the providers discretion.     Again, thank you for choosing [facility]  Our hope is that these requests will decrease the amount of time that you wait before being seen by our physicians.       _____________________________________________________________  Should you have questions after your visit to [facility], please contact our office at [phone] between the hours of 8:30 a.m. and 4:30 p.m.  Voicemails left after 4:30 p.m. will not be returned until the following business day.  For prescription refill requests, have your pharmacy contact our office.         Resources For Cancer Patients and their  Caregivers ? American Cancer Society: Can assist with transportation, wigs, general needs, runs Look Good Feel Better.        ([phone] 871-0013 ? Cancer Care: Provides financial assistance, online support groups, medication/co-pay assistance.  1-800-813-HOPE 816-871-1581) ? [facility] Assists [place] cancer patients and their families through emotional , educational and financial support.  515-313-9149 ? [place] DSS Where to apply for food stamps, Medicaid and utility assistance. 575-294-9243 ? [facility]: Transportation to medical appointments. (541)250-5266 ? Social Security Administration: May apply for disability if have a Stage IV cancer. (865)201-5287 229-516-8927 ? [employer], Disability and Transit Services: Assists with nutrition, care and transit needs. (704) 507-8035

## 2015-11-06 NOTE — Progress Notes (Signed)
[facility] Hematology/Oncology Consultation   Name: Marc [name]      MRN: [id number]    Location: Room/bed info not found  Date: 11/06/2015 Time:1:59 PM   REFERRING PHYSICIAN:  Tula Nakayama, MD (Primary Care Provider)  REASON FOR CONSULT:  Leukopenia   DIAGNOSIS:  Leukopenia dating back to at least 2008 with infrequent episodes of thrombocytopenia (more most recently).  HISTORY OF PRESENT ILLNESS:   Marc [name] is a 60 year old white American with a past medical history significant for MDD, chronic low back pain with nerve stimulator in place, HTN, chronic Hep C, IBS, narcotic addiction who is referred to [facility] for leukopenia.  I personally reviewed and went over laboratory results with the patient.  The results are noted within this dictation.  Labs demonstrate a leukopenia dating back to at least 2008 without any significant changes.  No obvious WBC type that is low due to no differentials being performed regularly (most differentials are WNL).  More recently, he has shown signs of thrombocytopenia.  Additionally, elevated AST and ALT are noted in March 2017 and May 2016.   I personally reviewed and went over radiographic studies with the patient.  The results are noted within this dictation.  No recent imaging that would assist in this patient's leukopenia/thrombocytosis.  Chart is reviewed.  Medications are reviewed.  No obvious medications implicated at this time.  Seroquel is a chronic medication and dates back years.  Discussion with patient leads to the following information: He denies any recurrent antibiotic needs, hospitalizations for infection, recurrent infections.  He denies any OTC supplements, vitamins, herbs.  He reports that since a recent snow storm, he feels as though he is going down.  He reports lightheadedness followed by LOC without loss of bowel or bladder control.  He notes a few falls over the past few months/weeks.   He thinks he is having "seizures."  For instance, yesterday, he was in a restaurant and become dizzy and lightheaded.  He denies any LOC.  It lasted a few minutes and then resolved spontaneously.  He notes that it lasted 10 minutes.  He denies any B symptoms except 4 lbs weight loss over ~ 6 months.  He has a decreased appetite x months-years.    We discussed his hepatitis history.  According to the patient, he believes he contracted the virus via recreational drugs while in the [employer].  He admits to IV drug use then.  He notes that he was treated for hepatitis in [place] and [place].  In 2009, he was reportedly "cured," but shortly thereafter, was told that he "relapsed."  He went to [facility] for Interferon treatment in 2009-2010 and the patient notes that he stopped treatment early because of complications.   Since then, he denies any follow-up.  I do not see any recent documentation of hepatitis status.   Review of Systems  Constitutional: Positive for weight loss (4 lbs weight loss over 6 months) and malaise/fatigue. Negative for fever, chills and diaphoresis.  HENT: Negative.  Negative for congestion, ear pain, hearing loss, nosebleeds, sore throat and tinnitus.   Eyes: Negative.  Negative for blurred vision, double vision, photophobia, pain and discharge.  Respiratory: Negative.  Negative for cough, hemoptysis, sputum production, shortness of breath, wheezing and stridor.   Cardiovascular: Negative.  Negative for chest pain, palpitations and leg swelling.  Gastrointestinal: Positive for constipation (secondary to pain medication). Negative for heartburn, nausea,  vomiting, abdominal pain, diarrhea, blood in stool and melena.  Genitourinary: Negative.  Negative for dysuria, urgency, frequency and hematuria.  Musculoskeletal: Positive for back pain, joint pain and falls. Negative for myalgias and neck pain.  Skin: Negative.  Negative for itching and rash.  Neurological: Positive for  dizziness, loss of consciousness and weakness. Negative for tingling, tremors, sensory change, speech change, focal weakness, seizures and headaches.  Endo/Heme/Allergies: Negative.  Does not bruise/bleed easily.  Psychiatric/Behavioral: Negative.      PAST MEDICAL HISTORY:   Past Medical History  Diagnosis Date  . Hepatitis C reactive     LIVER BX 2008-CHRONIC ACTIVE HEPATITIS  . Back problem   . GERD (gastroesophageal reflux disease)   . IBS (irritable bowel syndrome)   . HTN (hypertension)   . Hypercholesterolemia   . Hx of adenomatous colonic polyps 2008    due for surveillance 2018  . Knee pain   . Substance abuse     narcotic addiction  . Thyroiditis     History of  . Normal echocardiogram 05/25/2011    EF 55%, mild TR  . Normal cardiac stress test 05/25/2011    [facility] [place]  . Depression   . Hepatitis C 1979  . Allergy   . Anxiety   . Arthritis   . Leukopenia 11/06/2015  . Thrombocytopenia ([place]) 11/06/2015    ALLERGIES: Allergies  Allergen Reactions  . Levofloxacin Nausea Only and Other (See Comments)    "Creepy" feeling, skin didn't feel right, sort of itchy.  Baker Pierini [Lubiprostone]     MADE HIM FEEL STIFF  . Ciprofloxacin     Other reaction(s): Sweating (intolerance)  . Sulfa Antibiotics       MEDICATIONS: I have reviewed the patient's current medications.    Current Outpatient Prescriptions on File Prior to Visit  Medication Sig Dispense Refill  . acetaminophen (TYLENOL) 500 MG tablet Take 500 mg by mouth as needed.    Marland Kitchen azelastine (ASTELIN) 0.1 % nasal spray Place 2 sprays into both nostrils 2 (two) times daily. Use in each nostril as directed 30 mL 12  . clonazePAM (KLONOPIN) 0.5 MG tablet Take 1 tablet (0.5 mg total) by mouth 3 (three) times daily as needed for anxiety. 90 tablet 2  . cloNIDine (CATAPRES) 0.2 MG tablet TAKE (1) TABLET BY MOUTH TWICE DAILY. 60 tablet 2  . cyclobenzaprine (FLEXERIL) 10 MG tablet Take 1 tablet (10 mg  total) by mouth at bedtime. 30 tablet 4  . DULoxetine (CYMBALTA) 30 MG capsule Take 30 mg by mouth daily.    Marland Kitchen gabapentin (NEURONTIN) 400 MG capsule Take 1 capsule (400 mg total) by mouth 3 (three) times daily. 90 capsule 2  . HYDROcodone-acetaminophen (NORCO) 7.5-325 MG per tablet Take 1 tablet by mouth 3 (three) times daily.    Marland Kitchen ibuprofen (ADVIL,MOTRIN) 200 MG tablet Take 400-600 mg by mouth every 6 (six) hours as needed. For pain    . Lactobacillus (DIGESTIVE HEALTH PROBIOTIC PO) Take 1 capsule by mouth daily.    Marland Kitchen loratadine (CLARITIN) 10 MG tablet Take 10 mg by mouth daily.    . mometasone (NASONEX) 50 MCG/ACT nasal spray Place 2 sprays into the nose daily. 17 g 12  . omeprazole (PRILOSEC) 20 MG capsule Take 20 mg by mouth daily.    . QUEtiapine (SEROQUEL) 50 MG tablet Take by mouth 1 tablet 3 times daily and 2 at bedtime 150 tablet 2  . senna (SENOKOT) 8.6 MG TABS tablet Take 1 tablet  by mouth daily as needed for mild constipation.    . methocarbamol (ROBAXIN) 750 MG tablet TAKE (1) TABLET BY MOUTH (3) TIMES DAILY. (Patient not taking: Reported on 11/06/2015) 40 tablet 0   No current facility-administered medications on file prior to visit.     PAST SURGICAL HISTORY Past Surgical History  Procedure Laterality Date  . Appendectomy    . Tonsillectomy    . Carpal tunnel release      rt  . Right ring finger    . Spinal stenosis, had screws put in neck  04/11/2009    DR KRITZER  . Upper gastrointestinal endoscopy  2008 [facility] ABD PAIN WEIGHT LOSS d100 v8 phen 12.5    NL  . Colonoscopy  2008 [facility] ARS D100 V8 PHEN 12.5    2 SIMPLE ADENOMAS (< 1 CM)  . Knee arthroscopy    . Ulnar nerve transposition    . Esophagogastroduodenoscopy  04/26/2012    [facility]: Non-erosive gastritis (inflammation) was found in the gastric antrum but no H.pylori; multiple biopsies (duodenal bx negative for Celiac)/ The mucosa of the esophagus appeared normal  . Eus  09/08/2012    Procedure: UPPER ENDOSCOPIC ULTRASOUND  (EUS) LINEAR;  Surgeon: Milus Banister, MD;  Location: [facility];  Service: Endoscopy;  Laterality: N/A;  . Neurostimulator implant    . Wrist surgery Right jan 2015    Dr. Edmonia Lynch  . Hardware removal Right 02/12/2014    Procedure: HARDWARE REMOVAL;  Surgeon: Jolyn Nap, MD;  Location: [place];  Service: Orthopedics;  Laterality: Right;  . Ulna osteotomy Right 02/12/2014    Procedure: RIGHT ULNAR SHORTENING AND OSTEOTOMY ;  Surgeon: Jolyn Nap, MD;  Location: [facility];  Service: Orthopedics;  Laterality: Right;    FAMILY HISTORY: Family History  Problem Relation Age of Onset  . Heart defect      FAMILY HX  . Cancer Mother     BLADDER  . Cancer Sister     METS  . Emotional abuse Sister   . Stroke Sister   . Emotional abuse Sister   . Heart failure Father   . Stroke Father   . Alcohol abuse Father   . Colon cancer Neg Hx   . Colon polyps Neg Hx   . Drug abuse Neg Hx   . Schizophrenia Neg Hx   . Seizures Neg Hx   . Sexual abuse Neg Hx   . Physical abuse Neg Hx   . Dementia Paternal Aunt   . Alcohol abuse Paternal Uncle   . Dementia Paternal Uncle   . Dementia Paternal Grandmother   . ADD / ADHD Cousin   . Bipolar disorder Cousin   . Anxiety disorder Cousin   . Depression Cousin     Committed suicide  . Alcohol abuse Cousin   . OCD Cousin   . Paranoid behavior Cousin    Mother is deceased at the age of 13 from ?urothelial cancer? Father is deceased at the age of 26 from CHF 1 sister is deceased from "abdominal cancer" at the age of 5 (primary unknown to patient). 1 sister deceased from complications of a stroke at the age of 42; she was also mentally handicapped. 1 son who is 46 and healthy 1 grand-daughter who is 31 years old and healthy.   SOCIAL HISTORY: He admits to a smoking history of 2-3 pack ppd quitting 1987 after having smoked x 8 years = 24 pack years.  He  also used to drink EtOH and admits to being a  "weekend warrior."  He quit drinking in 2009.  He used to drink 200 mL of hard liquor with 2-4 16 oz cans of beer nightly on weekends ("sometimes more").  He admits to a history of marijuana; quitting in 2008.  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     PERFORMANCE STATUS: The patient's performance status is 1 - Symptomatic but completely ambulatory  PHYSICAL EXAM: Most Recent Vital Signs: Blood pressure 113/72, pulse 79, resp. rate 18, weight 165 lb 11.2 oz (75.161 kg), SpO2 100 %. General appearance: alert, cooperative, appears older than stated age, no distress, slowed mentation and unaccompanied, flat/blunted affect. Head: Normocephalic, without obvious abnormality, atraumatic Eyes: negative findings: lids and lashes normal, conjunctivae and sclerae normal, corneas clear and pupils equal, round, reactive to light and accomodation Ears: external ears are normal Throat: normal findings: lips normal without lesions, buccal mucosa normal, tongue midline and normal and oropharynx pink & moist without lesions or evidence of thrush Neck: no adenopathy, supple, symmetrical, trachea midline and thyroid not enlarged, symmetric, no tenderness/mass/nodules Lungs: clear to auscultation bilaterally and normal percussion bilaterally Heart: regular rate and rhythm, S1, S2 normal, no murmur, click, rub or gallop Abdomen: soft, non-tender; bowel sounds normal; no masses,  no organomegaly Extremities: extremities normal, atraumatic, no cyanosis or edema Skin: Skin color, texture, turgor normal. No rashes or lesions Lymph nodes: Cervical, supraclavicular, and axillary nodes normal. Neurologic: Grossly normal  LABORATORY DATA:  CBC    Component Value Date/Time   WBC 3.0* 10/21/2015 1622   RBC 3.97* 10/21/2015 1622   HGB 13.3 10/21/2015 1622   HCT 39.5 10/21/2015 1622   PLT 105* 10/21/2015 1622   MCV 99.5 10/21/2015 1622   MCH 33.5 10/21/2015 1622    MCHC 33.7 10/21/2015 1622   RDW 15.3 10/21/2015 1622   LYMPHSABS 1.1 07/14/2012 1540   MONOABS 0.4 07/14/2012 1540   EOSABS 0.0 07/14/2012 1540   BASOSABS 0.0 07/14/2012 1540      Chemistry      Component Value Date/Time   NA 141 10/21/2015 1622   K 4.7 10/21/2015 1622   CL 107 10/21/2015 1622   CO2 29 10/21/2015 1622   BUN 16 10/21/2015 1622   CREATININE 0.92 10/21/2015 1622   CREATININE 1.10 01/25/2014 1210      Component Value Date/Time   CALCIUM 9.1 10/21/2015 1622   ALKPHOS 66 10/21/2015 1622   AST 113* 10/21/2015 1622   ALT 131* 10/21/2015 1622   BILITOT 0.6 10/21/2015 1622       ASSESSMENT/PLAN:   Leukopenia Leukopenia without known predominance of particular WBC line due to lack of differentials.  Stable dating back to at least 2008.  Reported history of Hepatitis.  Labs today: CBC diff, CMET, pathologist smear review, hepatitis panel, hepatitis B surface antigen, HIV antibody, ESR, CRP, ANA, B12, Folate, iron/TIBC, ferritin, IgG, IgA, IgM.  Given his hepatitis history and recent liver test results, we must be suspicious of hepatitis/hepatitis-induced cirrhosis.  Both can result in lab abnormalities, specifically in regards to his CBC, leukopenia and thrombocytopenia.  [name] of liver to evaluate for cirrhosis and [name] of spleen to evaluate for splenomegaly.  He will return in 2-3 weeks for follow-up.  Thrombocytopenia (HCC) Thrombocytopenia, noted in 2013 but on last two blood checks noted in December 2015 and March 2017 with most recent platelet count being 105,000.  In his chart, he is noted to have hepatitis, chronic.  He reports that he was "cured" in 2009 but told he relapsed.  He reports "relapse" in 2009-2010 that was treated with interferon at [facility] but discontinued early due to "complications."  I do not see any recent testing for Hepatitis.  With recent lab abnormalities in liver enzymes, we are suspicious for hepatic abnormality +/-  hepatitis.  Labs today: CBC diff, CMET, pathologist smear review, hepatitis panel, hepatitis B surface antigen, HIV antibody, ESR, CRP, ANA, B12, Folate, iron/TIBC, ferritin, IgG, IgA, IgM.  [name] of spleen and liver to evaluate for cirrhosis of liver and splenomegaly.  All questions were answered. The patient knows to call the clinic with any problems, questions or concerns. We can certainly see the patient much sooner if necessary.  This note is electronically signed by: Molli Hazard, MD  11/06/2015 1:59 PM

## 2015-11-07 ENCOUNTER — Other Ambulatory Visit (HOSPITAL_COMMUNITY): Payer: Self-pay | Admitting: Emergency Medicine

## 2015-11-07 ENCOUNTER — Other Ambulatory Visit (HOSPITAL_COMMUNITY): Payer: Self-pay | Admitting: Oncology

## 2015-11-07 DIAGNOSIS — R768 Other specified abnormal immunological findings in serum: Secondary | ICD-10-CM

## 2015-11-07 LAB — HEPATITIS PANEL, ACUTE
HCV Ab: >11 s/co ratio — ABNORMAL HIGH (ref 0.0–0.9)
HEP B C IGM: NEGATIVE
HEP B S AG: NEGATIVE
Hep A IgM: NEGATIVE

## 2015-11-07 LAB — ANTINUCLEAR ANTIBODIES, IFA: ANTINUCLEAR ANTIBODIES, IFA: NEGATIVE

## 2015-11-07 LAB — IGG, IGA, IGM
IGA: 158 mg/dL (ref 61–437)
IGM, SERUM: 138 mg/dL (ref 20–172)
IgG (Immunoglobin G), Serum: 1178 mg/dL (ref 700–1600)

## 2015-11-07 LAB — PATHOLOGIST SMEAR REVIEW

## 2015-11-07 LAB — HIV ANTIBODY (ROUTINE TESTING W REFLEX): HIV SCREEN 4TH GENERATION: NONREACTIVE

## 2015-11-07 LAB — HEPATITIS B SURFACE ANTIGEN: Hepatitis B Surface Ag: NEGATIVE

## 2015-11-08 ENCOUNTER — Encounter (HOSPITAL_COMMUNITY): Payer: Medicare HMO

## 2015-11-08 DIAGNOSIS — D72819 Decreased white blood cell count, unspecified: Secondary | ICD-10-CM | POA: Diagnosis not present

## 2015-11-08 DIAGNOSIS — R768 Other specified abnormal immunological findings in serum: Secondary | ICD-10-CM

## 2015-11-11 LAB — HEPATITIS C VRS RNA DETECT BY PCR-QUAL: HEPATITIS C VRS RNA BY PCR-QUAL: POSITIVE — AB

## 2015-11-13 ENCOUNTER — Encounter: Payer: Self-pay | Admitting: Gastroenterology

## 2015-11-13 ENCOUNTER — Ambulatory Visit (INDEPENDENT_AMBULATORY_CARE_PROVIDER_SITE_OTHER): Payer: Medicare HMO | Admitting: Gastroenterology

## 2015-11-13 ENCOUNTER — Telehealth: Payer: Self-pay | Admitting: Gastroenterology

## 2015-11-13 ENCOUNTER — Other Ambulatory Visit (HOSPITAL_COMMUNITY)
Admission: RE | Admit: 2015-11-13 | Discharge: 2015-11-13 | Disposition: A | Discharge status: Home / Self Care | Payer: Medicare HMO | Source: Ambulatory Visit | Attending: Gastroenterology | Admitting: Gastroenterology

## 2015-11-13 ENCOUNTER — Other Ambulatory Visit: Payer: Self-pay

## 2015-11-13 VITALS — BP 157/83 | HR 110 | Temp 98.0°F | Ht 70.0 in | Wt 167.0 lb

## 2015-11-13 DIAGNOSIS — Z860100 Personal history of colon polyps, unspecified: Secondary | ICD-10-CM | POA: Insufficient documentation

## 2015-11-13 DIAGNOSIS — B182 Chronic viral hepatitis C: Secondary | ICD-10-CM

## 2015-11-13 DIAGNOSIS — T402X5A Adverse effect of other opioids, initial encounter: Secondary | ICD-10-CM | POA: Diagnosis not present

## 2015-11-13 DIAGNOSIS — K5903 Drug induced constipation: Secondary | ICD-10-CM | POA: Diagnosis not present

## 2015-11-13 MED ORDER — PEG 3350-KCL-NA BICARB-NACL 420 G PO SOLR: 4000.0000 mL | Freq: Once | ORAL | Status: DC

## 2015-11-13 MED ORDER — NALOXEGOL OXALATE 25 MG PO TABS: 25.0000 mg | ORAL_TABLET | Freq: Every day | ORAL | Status: DC

## 2015-11-13 NOTE — Assessment & Plan Note (Signed)
2 adenomas in 2008, due for surveillance now. No concerning lower GI symptoms.   Proceed with colonoscopy  (+/- EGD due to ?cirrhosis) with Dr. Oneida Alar in the near future. The risks, benefits, and alternatives have been discussed in detail with the patient. They state understanding and desire to proceed.  PROPOFOL due to polypharmacy

## 2015-11-13 NOTE — Progress Notes (Signed)
Referring Provider: Fayrene Helper, MD Primary Care Physician:  Tula Nakayama, MD  Primary GI: Dr. Oneida Alar   Chief Complaint  Patient presents with  . Colonoscopy  . Hepatitis C    HPI:   Marc [name] is a 60 y.o. male presenting today with a history of chronic Hep C, GERD, IBS, history of adenomas and due for colonoscopy now. Most recent Hep C RNA positive. Unknown genotype. Appears he was treated for Hep C in the past and failed interferon/ribavirin regimen. He tells me he believes he was genotype 1a. US abdomen upcoming.   No rectal bleeding. Has chronic constipation. Takes Senokot but has to take for 2 days but can be really "messy". On hydrocodone TID. Has tried Amitiza in the past but didn't like it, felt nauseated. Felt funny taking it. Occasional reflux. On Prilosec once a day. No dysphagia. Last EGD 2013.    Past Medical History  Diagnosis Date  . Hepatitis C reactive     LIVER BX 2008-CHRONIC ACTIVE HEPATITIS  . Back problem   . GERD (gastroesophageal reflux disease)   . IBS (irritable bowel syndrome)   . HTN (hypertension)   . Hypercholesterolemia   . Hx of adenomatous colonic polyps 2008    due for surveillance 2017  . Knee pain   . Substance abuse     narcotic addiction  . Thyroiditis     History of  . Normal echocardiogram 05/25/2011    EF 55%, mild TR  . Normal cardiac stress test 05/25/2011    [facility] [place]  . Depression   . Hepatitis C 1979  . Allergy   . Anxiety   . Arthritis   . Leukopenia 11/06/2015  . Thrombocytopenia ([place]) 11/06/2015    Past Surgical History  Procedure Laterality Date  . Appendectomy    . Tonsillectomy    . Carpal tunnel release      rt  . Right ring finger    . Spinal stenosis, had screws put in neck  04/11/2009    DR KRITZER  . Upper gastrointestinal endoscopy  2008 [facility] ABD PAIN WEIGHT LOSS d100 v8 phen 12.5    NL  . Colonoscopy  2008 [facility] ARS D100 V8 PHEN 12.5    2 SIMPLE ADENOMAS (< 1 CM)  .  Knee arthroscopy    . Ulnar nerve transposition    . Esophagogastroduodenoscopy  04/26/2012    Dr. Oneida Alar: Non-erosive gastritis (inflammation) was found in the gastric antrum but no H.pylori; multiple biopsies (duodenal bx negative for Celiac)/ The mucosa of the esophagus appeared normal  . Eus  09/08/2012    Dr. Ardis Hughs: CBD dilated but no stones. Query secondary to Sphincter of Oddi stenosis, ?dysfunction, but clinically without symptoms  . Neurostimulator implant    . Wrist surgery Right jan 2015    Dr. Edmonia Lynch  . Hardware removal Right 02/12/2014    Procedure: HARDWARE REMOVAL;  Surgeon: Jolyn Nap, MD;  Location: [facility];  Service: Orthopedics;  Laterality: Right;  . Ulna osteotomy Right 02/12/2014    Procedure: RIGHT ULNAR SHORTENING AND OSTEOTOMY ;  Surgeon: Jolyn Nap, MD;  Location: [facility];  Service: Orthopedics;  Laterality: Right;    Current Outpatient Prescriptions  Medication Sig Dispense Refill  . acetaminophen (TYLENOL) 500 MG tablet Take 500 mg by mouth as needed.    Marland Kitchen azelastine (ASTELIN) 0.1 % nasal spray Place 2 sprays into both nostrils 2 (two) times daily.  Use in each nostril as directed 30 mL 12  . clonazePAM (KLONOPIN) 0.5 MG tablet Take 1 tablet (0.5 mg total) by mouth 3 (three) times daily as needed for anxiety. 90 tablet 2  . cloNIDine (CATAPRES) 0.2 MG tablet TAKE (1) TABLET BY MOUTH TWICE DAILY. 60 tablet 2  . cyclobenzaprine (FLEXERIL) 10 MG tablet Take 1 tablet (10 mg total) by mouth at bedtime. 30 tablet 4  . gabapentin (NEURONTIN) 400 MG capsule Take 1 capsule (400 mg total) by mouth 3 (three) times daily. 90 capsule 2  . HYDROcodone-acetaminophen (NORCO) 7.5-325 MG per tablet Take 1 tablet by mouth 3 (three) times daily.    Marland Kitchen ibuprofen (ADVIL,MOTRIN) 200 MG tablet Take 400-600 mg by mouth every 6 (six) hours as needed. For pain    . Lactobacillus (DIGESTIVE HEALTH PROBIOTIC PO) Take 1 capsule by mouth daily.     Marland Kitchen loratadine (CLARITIN) 10 MG tablet Take 10 mg by mouth daily.    . methocarbamol (ROBAXIN) 750 MG tablet TAKE (1) TABLET BY MOUTH (3) TIMES DAILY. 40 tablet 0  . mometasone (NASONEX) 50 MCG/ACT nasal spray Place 2 sprays into the nose daily. 17 g 12  . omeprazole (PRILOSEC) 20 MG capsule Take 20 mg by mouth daily.    . QUEtiapine (SEROQUEL) 50 MG tablet Take by mouth 1 tablet 3 times daily and 2 at bedtime 150 tablet 2  . senna (SENOKOT) 8.6 MG TABS tablet Take 1 tablet by mouth daily as needed for mild constipation.     No current facility-administered medications for this visit.    Allergies as of 11/13/2015 - Review Complete 11/13/2015  Allergen Reaction Noted  . Levofloxacin Nausea Only and Other (See Comments)   . Amitiza [lubiprostone]  07/22/2012  . Ciprofloxacin  06/13/2015  . Sulfa antibiotics  12/02/2012    Family History  Problem Relation Age of Onset  . Heart defect      FAMILY HX  . Cancer Mother     BLADDER  . Cancer Sister     METS  . Emotional abuse Sister   . Stroke Sister   . Emotional abuse Sister   . Heart failure Father   . Stroke Father   . Alcohol abuse Father   . Colon polyps Neg Hx   . Drug abuse Neg Hx   . Schizophrenia Neg Hx   . Seizures Neg Hx   . Sexual abuse Neg Hx   . Physical abuse Neg Hx   . Dementia Paternal Aunt   . Alcohol abuse Paternal Uncle   . Dementia Paternal Uncle   . Dementia Paternal Grandmother   . ADD / ADHD Cousin   . Bipolar disorder Cousin   . Anxiety disorder Cousin   . Depression Cousin     Committed suicide  . Alcohol abuse Cousin   . OCD Cousin   . Paranoid behavior Cousin   . Colon cancer Maternal Grandfather     Social History   Social History  . Marital Status: Divorced    Spouse Name: N/A  . Number of Children: N/A  . Years of Education: N/A   Social History Main Topics  . Smoking status: Former Smoker -- 1.50 packs/day for 8 years    Types: Cigarettes    Quit date: 08/13/1986  . Smokeless  tobacco: Never Used     Comment: quit 20 + yrs ago  . Alcohol Use: No  . Drug Use: No  . Sexual Activity: No   Other Topics  Concern  . None   Social History Narrative    Review of Systems: Gen: Denies fever, chills, anorexia. Denies fatigue, weakness, weight loss.  CV: Denies chest pain, palpitations, syncope, peripheral edema, and claudication. Resp: Denies dyspnea at rest, cough, wheezing, coughing up blood, and pleurisy. GI: see HPI  Derm: Denies rash, itching, dry skin Psych: +depression/anxiety  Heme: Denies bruising, bleeding, and enlarged lymph nodes.  Physical Exam: BP 157/83 mmHg  Pulse 110  Temp(Src) 98 F (36.7 C) (Oral)  Ht 5\' 10"  (1.778 m)  Wt 167 lb (75.751 kg)  BMI 23.96 kg/m2 General:   Alert and oriented. No distress noted. Pleasant and cooperative.  Head:  Normocephalic and atraumatic. Eyes:  Conjuctiva clear without scleral icterus. Mouth:  Oral mucosa pink and moist. Good dentition. No lesions. Heart:  S1, S2 present without murmurs, rubs, or gallops. Regular rate and rhythm. Abdomen:  +BS, soft, non-tender and non-distended. No rebound or guarding. No HSM or masses noted. Msk:  Symmetrical without gross deformities. Normal posture. Extremities:  Without edema. Neurologic:  Alert and  oriented x4;  grossly normal neurologically. Psych:  Alert and cooperative. Normal mood and affect.   Lab Results  Component Value Date   WBC 2.3* 11/06/2015   HGB 12.9* 11/06/2015   HCT 37.3* 11/06/2015   MCV 100.8* 11/06/2015   PLT 80* 11/06/2015   Lab Results  Component Value Date   ALT 120* 11/06/2015   AST 126* 11/06/2015   ALKPHOS 72 11/06/2015   BILITOT 0.8 11/06/2015   Lab Results  Component Value Date   CREATININE 0.99 11/06/2015   BUN 17 11/06/2015   NA 137 11/06/2015   K 3.9 11/06/2015   CL 103 11/06/2015   CO2 26 11/06/2015

## 2015-11-13 NOTE — Telephone Encounter (Signed)
LMOM to call.

## 2015-11-13 NOTE — Assessment & Plan Note (Signed)
60 year old male with history of chronic Hep C, previously failing treatment with interferon/ribavirin in remote past. Positive viral load, unknown genotype. Needs US abdomen, strongly suspecting cirrhosis with his thrombocytopenia. If he does have evidence of cirrhosis, will need screening EGD for varices at time of upcoming colonoscopy. Labs ordered. Further recommendations to follow.

## 2015-11-13 NOTE — Telephone Encounter (Signed)
I need him to get an INR done as well. I totally forgot to order this at his visit this morning. Can we fax to [facility]?  Also, has he ever had Hep A and B vaccinations? If not, he needs this.

## 2015-11-13 NOTE — Progress Notes (Signed)
cc'ed to pcp °

## 2015-11-13 NOTE — Patient Instructions (Addendum)
Please complete blood work. This will help US guide what therapy to use to treat Hepatitis C.   Complete the ultrasound ordered by Hematology.   We do not have samples of Movantik, so I have sent to your pharmacy. Let's see what this costs. THIS TAKES THE PLACE OF SENNA. Call if you have severe abdominal pain or diarrhea. If you like this, we can send in a prescription.  We have scheduled you for a colonoscopy and possible upper endoscopy with Dr. Oneida Alar in the near future.

## 2015-11-13 NOTE — Assessment & Plan Note (Signed)
Senokot without much improvement. Start Movantik 25 mg once daily. Samples unavailable at time of visit. I have sent to pharmacy to see if this will be covered or not. May need to trial something different.

## 2015-11-13 NOTE — Telephone Encounter (Signed)
Pt is aware the order for INR is being faxed to [facility]. He thinks he had the Hepatitis A and Hepatitis B vaccinations, cannot remember exactly when, but will check it out and let us know.

## 2015-11-14 ENCOUNTER — Ambulatory Visit (HOSPITAL_COMMUNITY)
Admission: RE | Admit: 2015-11-14 | Discharge: 2015-11-14 | Disposition: A | Discharge status: Home / Self Care | Payer: Medicare HMO | Source: Ambulatory Visit | Attending: Oncology | Admitting: Oncology

## 2015-11-14 DIAGNOSIS — D72819 Decreased white blood cell count, unspecified: Secondary | ICD-10-CM | POA: Diagnosis not present

## 2015-11-14 DIAGNOSIS — D696 Thrombocytopenia, unspecified: Secondary | ICD-10-CM

## 2015-11-14 DIAGNOSIS — B1920 Unspecified viral hepatitis C without hepatic coma: Secondary | ICD-10-CM | POA: Diagnosis not present

## 2015-11-14 DIAGNOSIS — K7460 Unspecified cirrhosis of liver: Secondary | ICD-10-CM | POA: Diagnosis present

## 2015-11-14 LAB — PROTIME-INR
INR: 1.04 (ref <1.50)
Prothrombin Time: 13.7 seconds (ref 11.6–15.2)

## 2015-11-15 LAB — HCV RNA QUANT RFLX ULTRA OR GENOTYP
HCV RNA Qnt(log copy/mL): 6.417 log10 IU/mL
HepC Qn: 2610000 IU/mL

## 2015-11-15 LAB — HEPATITIS C GENOTYPE

## 2015-11-18 LAB — HCV RNA,LIPA RFLX NS5A DRUG RESIST

## 2015-11-21 LAB — HCV RNA NS5A DRUG RESISTANCE

## 2015-11-22 ENCOUNTER — Ambulatory Visit (HOSPITAL_COMMUNITY): Payer: Self-pay | Admitting: Psychiatry

## 2015-11-25 ENCOUNTER — Other Ambulatory Visit: Payer: Self-pay | Admitting: Family Medicine

## 2015-11-25 ENCOUNTER — Telehealth: Payer: Self-pay | Admitting: Gastroenterology

## 2015-11-25 MED ORDER — LEDIPASVIR-SOFOSBUVIR 90-400 MG PO TABS: 1.0000 | ORAL_TABLET | Freq: Every day | ORAL | Status: DC

## 2015-11-25 NOTE — Telephone Encounter (Signed)
Harvoni sent to [place]. Will await approval. Patient will need to have further instructions and medication picked up from our office prior to beginning.

## 2015-11-25 NOTE — Progress Notes (Signed)
Quick Note:  Hep C genotype 1a. We can get the ball rolling on Harvoni. I have sent to [facility]. Will need appt after colonoscopy. I recommend EGD at time of colonoscopy (it is already scheduled as "possible"). He may need an elastography, but we will see. ______

## 2015-11-26 NOTE — Telephone Encounter (Signed)
All required pt information has been faxed to [facility] for prior authorization.

## 2015-11-26 NOTE — Progress Notes (Signed)
Quick Note:  To [place]. ______

## 2015-11-26 NOTE — Progress Notes (Signed)
Quick Note:  PT is aware. Routing to [place] to make sure the EGD is confirmed. ______

## 2015-11-29 ENCOUNTER — Encounter (HOSPITAL_COMMUNITY): Payer: Medicare HMO | Attending: Oncology | Admitting: Hematology & Oncology

## 2015-11-29 ENCOUNTER — Encounter (HOSPITAL_COMMUNITY): Payer: Self-pay | Admitting: Hematology & Oncology

## 2015-11-29 VITALS — BP 140/86 | HR 96 | Temp 97.7°F | Resp 18 | Wt 173.6 lb

## 2015-11-29 DIAGNOSIS — R7989 Other specified abnormal findings of blood chemistry: Secondary | ICD-10-CM

## 2015-11-29 DIAGNOSIS — E7800 Pure hypercholesterolemia, unspecified: Secondary | ICD-10-CM | POA: Insufficient documentation

## 2015-11-29 DIAGNOSIS — Z79899 Other long term (current) drug therapy: Secondary | ICD-10-CM | POA: Insufficient documentation

## 2015-11-29 DIAGNOSIS — K760 Fatty (change of) liver, not elsewhere classified: Secondary | ICD-10-CM | POA: Diagnosis not present

## 2015-11-29 DIAGNOSIS — Z9889 Other specified postprocedural states: Secondary | ICD-10-CM | POA: Insufficient documentation

## 2015-11-29 DIAGNOSIS — Z809 Family history of malignant neoplasm, unspecified: Secondary | ICD-10-CM | POA: Insufficient documentation

## 2015-11-29 DIAGNOSIS — I10 Essential (primary) hypertension: Secondary | ICD-10-CM | POA: Insufficient documentation

## 2015-11-29 DIAGNOSIS — D72819 Decreased white blood cell count, unspecified: Secondary | ICD-10-CM

## 2015-11-29 DIAGNOSIS — D696 Thrombocytopenia, unspecified: Secondary | ICD-10-CM | POA: Diagnosis not present

## 2015-11-29 DIAGNOSIS — B1920 Unspecified viral hepatitis C without hepatic coma: Secondary | ICD-10-CM | POA: Diagnosis not present

## 2015-11-29 DIAGNOSIS — Z888 Allergy status to other drugs, medicaments and biological substances status: Secondary | ICD-10-CM | POA: Insufficient documentation

## 2015-11-29 DIAGNOSIS — B182 Chronic viral hepatitis C: Secondary | ICD-10-CM

## 2015-11-29 DIAGNOSIS — D539 Nutritional anemia, unspecified: Secondary | ICD-10-CM

## 2015-11-29 DIAGNOSIS — K219 Gastro-esophageal reflux disease without esophagitis: Secondary | ICD-10-CM | POA: Insufficient documentation

## 2015-11-29 NOTE — Patient Instructions (Addendum)
[place] at [facility] Discharge Instructions  RECOMMENDATIONS MADE BY THE CONSULTANT AND ANY TEST RESULTS WILL BE SENT TO YOUR REFERRING PHYSICIAN.   Exam and discussion by Dr Whitney Muse today  Return to see the doctor in 4 months with labs Please call the clinic if you have any questions or concerns     Thank you for choosing [place] at [facility] to provide your oncology and hematology care.  To afford each patient quality time with our provider, please arrive at least 15 minutes before your scheduled appointment time.   Beginning January 23rd 2017 lab work for the [facility] will be done in the  Main lab at [facility] on 1st floor. If you have a lab appointment with the [facility] please come in thru the  Main Entrance and check in at the main information desk  You need to re-schedule your appointment should you arrive 10 or more minutes late.  We strive to give you quality time with our providers, and arriving late affects you and other patients whose appointments are after yours.  Also, if you no show three or more times for appointments you may be dismissed from the clinic at the providers discretion.     Again, thank you for choosing [facility].  Our hope is that these requests will decrease the amount of time that you wait before being seen by our physicians.       _____________________________________________________________  Should you have questions after your visit to [facility], please contact our office at [phone] between the hours of 8:30 a.m. and 4:30 p.m.  Voicemails left after 4:30 p.m. will not be returned until the following business day.  For prescription refill requests, have your pharmacy contact our office.         Resources For Cancer Patients and their Caregivers ? American Cancer Society: Can assist with transportation, wigs, general needs, runs Look Good Feel  Better.        (805)148-5018 ? Cancer Care: Provides financial assistance, online support groups, medication/co-pay assistance.  1-800-813-HOPE 803 409 3390) ? [facility] Assists [place] cancer patients and their families through emotional , educational and financial support.  320-717-7165 ? [place] DSS Where to apply for food stamps, Medicaid and utility assistance. 815-827-6465 ? [facility]: Transportation to medical appointments. 818-559-0028 ? Social Security Administration: May apply for disability if have a Stage IV cancer. 419 487 6511 647-828-9331 ? [employer], Disability and Transit Services: Assists with nutrition, care and transit needs. 620-414-8049

## 2015-11-29 NOTE — Progress Notes (Signed)
Marc [name]      MRN: [id number]    Location: Room/bed info not found  Date: 11/29/2015 Time:9:09 AM   REFERRING PHYSICIAN:  Tula Nakayama, MD (Primary Care Provider)  REASON FOR CONSULT:  Leukopenia   DIAGNOSIS:  Leukopenia dating back to at least 2008 with infrequent episodes of thrombocytopenia (more most recently).  HISTORY OF PRESENT ILLNESS:   Marc [name] is a 60 year old white American with a past medical history significant for MDD, chronic low back pain with nerve stimulator in place, HTN, chronic Hep C, IBS, narcotic addiction who is referred to [facility] for leukopenia/thrombocytopenia.  Marc [name] returns to the [place] today alone.  Regarding Harvoni, he notes that GI has called him, but that they haven't moved forward with any treatment because it will cost a thousand dollars. He believes they are trying to get assistance for the drug cost.   In terms of communication with GI, he goes the 3rd for pre-op, and notes that he gets done with everything (colonoscopy, endoscopy) on May 9th.  He notes that he's been eating like a pig the past week or so. He says he's been having a real dry cough that "feels like I've got dirt in my throat." He was advised that this could be allergies, and he says "it could also be dust, since they are building these new apartments right next to me."  He also notes that he's had occasional pain in his eye (no visual changes), and was advised to see his eye doctor about this. He sees Dr. Gershon Crane through [facility].  He say he has Medicare which doesn't cover hardly anything. He was advised to looking into supplemental insurance coverage.    Review of Systems  Constitutional: Positive for weight loss (4 lbs weight loss over 6 months) and malaise/fatigue. Negative for fever, chills and diaphoresis.  HENT: Negative.  Negative for congestion, ear  pain, hearing loss, nosebleeds, sore throat and tinnitus.   Eyes: Negative.  Negative for blurred vision, double vision, photophobia, pain and discharge.  Respiratory: Negative.  Negative for cough, hemoptysis, sputum production, shortness of breath, wheezing and stridor.   Cardiovascular: Negative.  Negative for chest pain, palpitations and leg swelling.  Gastrointestinal: Positive for constipation (secondary to pain medication). Negative for heartburn, nausea, vomiting, abdominal pain, diarrhea, blood in stool and melena.  Genitourinary: Negative.  Negative for dysuria, urgency, frequency and hematuria.  Musculoskeletal: Positive for back pain, joint pain and falls. Negative for myalgias and neck pain.  Skin: Negative.  Negative for itching and rash.  Neurological: Positive for dizziness, loss of consciousness and weakness. Negative for tingling, tremors, sensory change, speech change, focal weakness, seizures and headaches.  Endo/Heme/Allergies: Negative.  Does not bruise/bleed easily.  Psychiatric/Behavioral: Negative.      PAST MEDICAL HISTORY:   Past Medical History  Diagnosis Date  . Hepatitis C reactive     LIVER BX 2008-CHRONIC ACTIVE HEPATITIS  . Back problem   . GERD (gastroesophageal reflux disease)   . IBS (irritable bowel syndrome)   . HTN (hypertension)   . Hypercholesterolemia   . Hx of adenomatous colonic polyps 2008    due for surveillance 2017  . Knee pain   . Substance abuse     narcotic addiction  . Thyroiditis     History of  . Normal echocardiogram 05/25/2011    EF 55%,  mild TR  . Normal cardiac stress test 05/25/2011    [facility]  . Depression   . Hepatitis C 1979  . Allergy   . Anxiety   . Arthritis   . Leukopenia 11/06/2015  . Thrombocytopenia ([place]) 11/06/2015    ALLERGIES: Allergies  Allergen Reactions  . Levofloxacin Nausea Only and Other (See Comments)    "Creepy" feeling, skin didn't feel right, sort of itchy.  Baker Pierini  [Lubiprostone]     MADE HIM FEEL STIFF  . Ciprofloxacin     Other reaction(s): Sweating (intolerance)  . Sulfa Antibiotics       MEDICATIONS: I have reviewed the patient's current medications.    Current Outpatient Prescriptions on File Prior to Visit  Medication Sig Dispense Refill  . acetaminophen (TYLENOL) 500 MG tablet Take 500 mg by mouth as needed.    Marland Kitchen azelastine (ASTELIN) 0.1 % nasal spray Place 2 sprays into both nostrils 2 (two) times daily. Use in each nostril as directed 30 mL 12  . clonazePAM (KLONOPIN) 0.5 MG tablet Take 1 tablet (0.5 mg total) by mouth 3 (three) times daily as needed for anxiety. 90 tablet 2  . cloNIDine (CATAPRES) 0.2 MG tablet TAKE (1) TABLET BY MOUTH TWICE DAILY. 60 tablet 2  . cyclobenzaprine (FLEXERIL) 10 MG tablet Take 1 tablet (10 mg total) by mouth at bedtime. 30 tablet 4  . gabapentin (NEURONTIN) 400 MG capsule Take 1 capsule (400 mg total) by mouth 3 (three) times daily. 90 capsule 2  . HYDROcodone-acetaminophen (NORCO) 7.5-325 MG per tablet Take 1 tablet by mouth 3 (three) times daily.    Marland Kitchen ibuprofen (ADVIL,MOTRIN) 200 MG tablet Take 400-600 mg by mouth every 6 (six) hours as needed. For pain    . Lactobacillus (DIGESTIVE HEALTH PROBIOTIC PO) Take 1 capsule by mouth daily.    . Ledipasvir-Sofosbuvir (HARVONI) 90-400 MG TABS Take 1 tablet by mouth daily. 90 tablet 0  . loratadine (CLARITIN) 10 MG tablet Take 10 mg by mouth daily.    . mometasone (NASONEX) 50 MCG/ACT nasal spray Place 2 sprays into the nose daily. 17 g 12  . naloxegol oxalate (MOVANTIK) 25 MG TABS tablet Take 1 tablet (25 mg total) by mouth daily. 30 tablet 3  . omeprazole (PRILOSEC) 20 MG capsule Take 20 mg by mouth daily.    . polyethylene glycol-electrolytes (NULYTELY/GOLYTELY) 420 g solution Take 4,000 mLs by mouth once. 4000 mL 0  . QUEtiapine (SEROQUEL) 50 MG tablet Take by mouth 1 tablet 3 times daily and 2 at bedtime 150 tablet 2  . senna (SENOKOT) 8.6 MG TABS tablet Take 1  tablet by mouth daily as needed for mild constipation.     No current facility-administered medications on file prior to visit.     PAST SURGICAL HISTORY Past Surgical History  Procedure Laterality Date  . Appendectomy    . Tonsillectomy    . Carpal tunnel release      rt  . Right ring finger    . Spinal stenosis, had screws put in neck  04/11/2009    DR KRITZER  . Upper gastrointestinal endoscopy  2008 [facility] ABD PAIN WEIGHT LOSS d100 v8 phen 12.5    NL  . Colonoscopy  2008 [facility] ARS D100 V8 PHEN 12.5    2 SIMPLE ADENOMAS (< 1 CM)  . Knee arthroscopy    . Ulnar nerve transposition    . Esophagogastroduodenoscopy  04/26/2012    Dr. Oneida Alar: Non-erosive gastritis (inflammation) was found  in the gastric antrum but no H.pylori; multiple biopsies (duodenal bx negative for Celiac)/ The mucosa of the esophagus appeared normal  . Eus  09/08/2012    Dr. Ardis Hughs: CBD dilated but no stones. Query secondary to Sphincter of Oddi stenosis, ?dysfunction, but clinically without symptoms  . Neurostimulator implant    . Wrist surgery Right jan 2015    Dr. Edmonia Lynch  . Hardware removal Right 02/12/2014    Procedure: HARDWARE REMOVAL;  Surgeon: Jolyn Nap, MD;  Location: [facility];  Service: Orthopedics;  Laterality: Right;  . Ulna osteotomy Right 02/12/2014    Procedure: RIGHT ULNAR SHORTENING AND OSTEOTOMY ;  Surgeon: Jolyn Nap, MD;  Location: [place];  Service: Orthopedics;  Laterality: Right;    FAMILY HISTORY: Family History  Problem Relation Age of Onset  . Heart defect      FAMILY HX  . Cancer Mother     BLADDER  . Cancer Sister     METS  . Emotional abuse Sister   . Stroke Sister   . Emotional abuse Sister   . Heart failure Father   . Stroke Father   . Alcohol abuse Father   . Colon polyps Neg Hx   . Drug abuse Neg Hx   . Schizophrenia Neg Hx   . Seizures Neg Hx   . Sexual abuse Neg Hx   . Physical abuse Neg Hx   . Dementia Paternal  Aunt   . Alcohol abuse Paternal Uncle   . Dementia Paternal Uncle   . Dementia Paternal Grandmother   . ADD / ADHD Cousin   . Bipolar disorder Cousin   . Anxiety disorder Cousin   . Depression Cousin     Committed suicide  . Alcohol abuse Cousin   . OCD Cousin   . Paranoid behavior Cousin   . Colon cancer Maternal Grandfather    Mother is deceased at the age of 79 from ?urothelial cancer? Father is deceased at the age of 40 from CHF 1 sister is deceased from "abdominal cancer" at the age of 68 (primary unknown to patient). 1 sister deceased from complications of a stroke at the age of 67; she was also mentally handicapped. 1 son who is 7 and healthy 1 grand-daughter who is 71 years old and healthy.   SOCIAL HISTORY: He admits to a smoking history of 2-3 pack ppd quitting 1987 after having smoked x 8 years = 24 pack years.  He also used to drink EtOH and admits to being a "weekend warrior."  He quit drinking in 2009.  He used to drink 200 mL of hard liquor with 2-4 16 oz cans of beer nightly on weekends ("sometimes more").  He admits to a history of marijuana; quitting in 2008.  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    PERFORMANCE STATUS: The patient's performance status is 1 - Symptomatic but completely ambulatory  PHYSICAL EXAM: Most Recent Vital Signs: Blood pressure 140/86, pulse 96, temperature 97.7 F (36.5 C), temperature source Oral, resp. rate 18, weight 173 lb 9.6 oz (78.744 kg), SpO2 98 %. General appearance: alert, cooperative, appears older than stated age, no distress Head: Normocephalic, without obvious abnormality, atraumatic Eyes: negative findings: lids and lashes normal, conjunctivae and sclerae normal, corneas clear and pupils equal, round, reactive to light and accomodation Ears: external ears are normal Throat: normal findings: lips normal without lesions, buccal  mucosa normal, tongue midline and normal  and oropharynx pink & moist without lesions or evidence of thrush Neck: no adenopathy, supple, symmetrical, trachea midline and thyroid not enlarged, symmetric, no tenderness/mass/nodules Lungs: clear to auscultation bilaterally and normal percussion bilaterally Heart: regular rate and rhythm, S1, S2 normal, no murmur, click, rub or gallop Abdomen: soft, non-tender; bowel sounds normal; no masses,  no organomegaly Extremities: extremities normal, atraumatic, no cyanosis or edema Skin: Skin color, texture, turgor normal. No rashes or lesions Lymph nodes: Cervical, supraclavicular, and axillary nodes normal. Neurologic: Grossly normal  LABORATORY DATA:  I have reviewed the data as listed.  CBC    Component Value Date/Time   WBC 2.3* 11/06/2015 1345   RBC 3.70* 11/06/2015 1345   HGB 12.9* 11/06/2015 1345   HCT 37.3* 11/06/2015 1345   PLT 80* 11/06/2015 1345   MCV 100.8* 11/06/2015 1345   MCH 34.9* 11/06/2015 1345   MCHC 34.6 11/06/2015 1345   RDW 13.2 11/06/2015 1345   LYMPHSABS 1.2 11/06/2015 1345   MONOABS 0.2 11/06/2015 1345   EOSABS 0.1 11/06/2015 1345   BASOSABS 0.0 11/06/2015 1345      Chemistry      Component Value Date/Time   NA 137 11/06/2015 1345   K 3.9 11/06/2015 1345   CL 103 11/06/2015 1345   CO2 26 11/06/2015 1345   BUN 17 11/06/2015 1345   CREATININE 0.99 11/06/2015 1345   CREATININE 0.92 10/21/2015 1622      Component Value Date/Time   CALCIUM 8.9 11/06/2015 1345   ALKPHOS 72 11/06/2015 1345   AST 126* 11/06/2015 1345   ALT 120* 11/06/2015 1345   BILITOT 0.8 11/06/2015 1345     Results for Marc [name], Marc [name] (MRN [id number]) as of 11/29/2015 13:09  Ref. Range 11/06/2015 13:45 11/06/2015 13:46  Iron Latest Ref Range: 45-182 ug/dL 136   UIBC Latest Units: ug/dL 183   TIBC Latest Ref Range: 250-450 ug/dL 319   Saturation Ratios Latest Ref Range: 17.9-39.5 % 43 (H)   Ferritin Latest Ref Range: 24-336 ng/mL 714 (H)   Folate Latest Ref Range: >5.9 ng/mL  13.8   CRP Latest Ref Range: <1.0 mg/dL <0.5   Vitamin B12 Latest Ref Range: 180-914 pg/mL 462   IgG (Immunoglobin G), Serum Latest Ref Range: 229-863-9928 mg/dL 1178   IgA Latest Ref Range: 61-437 mg/dL 158   IgM, Serum Latest Ref Range: 20-172 mg/dL 138    Results for Marc [name], Marc [name] (MRN [id number]) as of 11/29/2015 13:09  Ref. Range 11/06/2015 13:45  Sed Rate Latest Ref Range: 0-16 mm/hr 14      RADIOLOGY  Study Result     CLINICAL DATA: Hepatitis c. Cirrhosis. Thrombocytopenia. Leukopenia.  EXAM: ABDOMEN ULTRASOUND COMPLETE  COMPARISON: 04/29/2012  FINDINGS: Gallbladder: No gallstones or wall thickening visualized. No sonographic Murphy sign noted by sonographer.  Common bile duct: Diameter: 5 mm  Liver: No focal lesion identified. Coarse echogenic liver with poor sonic penetration compatible with diffuse hepatic steatosis. No specific morphologic findings of cirrhosis at this time.  IVC: No abnormality visualized.  Pancreas: Not seen due to overlying bowel gas.  Spleen: Size and appearance within normal limits.  Right Kidney: Length: 11.2 cm. Echogenicity within normal limits. No mass or hydronephrosis visualized.  Left Kidney: Length: 12.0 cm. Echogenicity within normal limits. No mass or hydronephrosis visualized.  Abdominal aorta: No aneurysm visualized. Proximal abdominal aorta and bifurcation not well seen due to overlying bowel gas.  Other findings: None.  IMPRESSION: 1. Coarse echogenic liver  with poor sonic penetration compatible with diffuse hepatic steatosis. 2. The pancreas and portions the abdominal aorta were not well seen due to overlying bowel gas. 3. Otherwise, no significant abnormalities are observed.   Electronically Signed  By: Van Clines M.D.  On: 11/14/2015 11:00     ASSESSMENT/PLAN:  Leukopenia Thrombocytopenia (HCC) Macrocytic Anemia Fatty Liver Hepatitis C Elevated Ferritin/Iron  saturation  Currently I feel his laboratory abnormalities can be explained secondary to his liver disease ie. Fatty liver and hepatitis C.  I have recommended that he proceeed with hepatitis treatment with GI. We will follow up with him post.  I do not feel a need to proceed with BMBX or additional workup at this point.  After he has completed therapy for his hep C we will also repeat iron studies/ferritin. If still markedly elevated may need to consider a hemochromatosis panel given his iron saturation. However, his ferritin may be elevated currently as it is an acute phase reactant and patient has active hepatitis C.  I would like to bring him back in 3-4 months after he's completedHarvoni treatment. We will see if Angie will come and discuss supplement plans with him today before he leaves.  He has an appointment with GI May 9th, with pre-op occuring May 3rd.  He will return in 3-4 months.  Orders Placed This Encounter  Procedures  . CBC with Differential    Standing Status: Future     Number of Occurrences:      Standing Expiration Date: 11/28/2016    All questions were answered. The patient knows to call the clinic with any problems, questions or concerns. We can certainly see the patient much sooner if necessary.  This document serves as a record of services personally performed by Ancil Linsey, MD. It was created on her behalf by Toni Amend, a trained medical scribe. The creation of this record is based on the scribe's personal observations and the provider's statements to them. This document has been checked and approved by the attending provider.  I have reviewed the above documentation for accuracy and completeness and I agree with the above.  This note is electronically signed by:  Molli Hazard, MD  11/29/2015 9:09 AM

## 2015-12-09 ENCOUNTER — Telehealth: Payer: Self-pay

## 2015-12-09 NOTE — Telephone Encounter (Signed)
Pt is aware to call the number listed.

## 2015-12-09 NOTE — Telephone Encounter (Signed)
Pt called and needs to cancel his procedure for now. Didn't say why. Wants procedure in June. Pt to call us back to reschedule

## 2015-12-09 NOTE — Telephone Encounter (Signed)
Pt has been approved for Harvoni but his copay is 3804.36. I spoke with Sherren Mocha at [employer], they are working on getting him some assistance with his copay, they have him approved for a grant but they need to speak with the pt. They have called him 8 or 9 times and he wont call them back. I tried to call the pt, NA-LMOM asked him to call me back. Pt needs to call [phone] and speak with someone at [employer] so they can help him with the cost of this medication.

## 2015-12-10 NOTE — Patient Instructions (Signed)
Marc [name]  12/10/2015     @PREFPERIOPPHARMACY @   Your procedure is scheduled on   12/17/2015   Report to [name] at  830  A.M.  Call this number if you have problems the morning of surgery:  [phone]   Remember:  Do not eat food or drink liquids after midnight.  Take these medicines the morning of surgery with A SIP OF WATER  Clonazepam, clonidine, flexaril, neurontin, hydrocodone, ledipasvir, prilosec, seroquel.   Do not wear jewelry, make-up or nail polish.  Do not wear lotions, powders, or perfumes.  You may wear deodorant.  Do not shave 48 hours prior to surgery.  Men may shave face and neck.  Do not bring valuables to the hospital.  [facility] is not responsible for any belongings or valuables.  Contacts, dentures or bridgework may not be worn into surgery.  Leave your suitcase in the car.  After surgery it may be brought to your room.  For patients admitted to the hospital, discharge time will be determined by your treatment team.  Patients discharged the day of surgery will not be allowed to drive home.   Name and phone number of your driver:   family Special instructions:  Follow the diet and prep instructions given to you by Dr Nona Dell office.  Please read over the following fact sheets that you were given. Coughing and Deep Breathing, Surgical Site Infection Prevention, Anesthesia Post-op Instructions and Care and Recovery After Surgery      Esophagogastroduodenoscopy Esophagogastroduodenoscopy (EGD) is a procedure that is used to examine the lining of the esophagus, stomach, and first part of the small intestine (duodenum). A long, flexible, lighted tube with a camera attached (endoscope) is inserted down the throat to view these organs. This procedure is done to detect problems or abnormalities, such as inflammation, bleeding, ulcers, or growths, in order to treat them. The procedure lasts 5-20 minutes. It is usually an outpatient  procedure, but it may need to be performed in a hospital in emergency cases. LET [facility] CARE PROVIDER KNOW ABOUT:  Any allergies you have.  All medicines you are taking, including vitamins, herbs, eye drops, creams, and over-the-counter medicines.  Previous problems you or members of your family have had with the use of anesthetics.  Any blood disorders you have.  Previous surgeries you have had.  Medical conditions you have. RISKS AND COMPLICATIONS Generally, this is a safe procedure. However, problems can occur and include:  Infection.  Bleeding.  Tearing (perforation) of the esophagus, stomach, or duodenum.  Difficulty breathing or not being able to breathe.  Excessive sweating.  Spasms of the larynx.  Slowed heartbeat.  Low blood pressure. BEFORE THE PROCEDURE  Do not eat or drink anything after midnight on the night before the procedure or as directed by your health care provider.  Do not take your regular medicines before the procedure if your health care provider asks you not to. Ask your health care provider about changing or stopping those medicines.  If you wear dentures, be prepared to remove them before the procedure.  Arrange for someone to drive you home after the procedure. PROCEDURE  A numbing medicine (local anesthetic) may be sprayed in your throat for comfort and to stop you from gagging or coughing.  You will have an IV tube inserted in a vein in your hand or arm. You will receive medicines and fluids through  this tube.  You will be given a medicine to relax you (sedative).  A pain reliever will be given through the IV tube.  A mouth guard may be placed in your mouth to protect your teeth and to keep you from biting on the endoscope.  You will be asked to lie on your left side.  The endoscope will be inserted down your throat and into your esophagus, stomach, and duodenum.  Air will be put through the endoscope to allow your health  care provider to clearly view the lining of your esophagus.  The lining of your esophagus, stomach, and duodenum will be examined. During the exam, your health care provider may:  Remove tissue to be examined under a microscope (biopsy) for inflammation, infection, or other medical problems.  Remove growths.  Remove objects (foreign bodies) that are stuck.  Treat any bleeding with medicines or other devices that stop tissues from bleeding (hot cautery, clipping devices).  Widen (dilate) or stretch narrowed areas of your esophagus and stomach.  The endoscope will be withdrawn. AFTER THE PROCEDURE  You will be taken to a recovery area for observation. Your blood pressure, heart rate, breathing rate, and blood oxygen level will be monitored often until the medicines you were given have worn off.  Do not eat or drink anything until the numbing medicine has worn off and your gag reflex has returned. You may choke.  Your health care provider should be able to discuss his or her findings with you. It will take longer to discuss the test results if any biopsies were taken.   This information is not intended to replace advice given to you by your health care provider. Make sure you discuss any questions you have with your health care provider.   Document Released: 11/27/2004 Document Revised: 08/17/2014 Document Reviewed: 06/29/2012 Elsevier Interactive Patient Education 2016 [employer]. Esophagogastroduodenoscopy, Care After Refer to this sheet in the next few weeks. These instructions provide you with information about caring for yourself after your procedure. Your health care provider may also give you more specific instructions. Your treatment has been planned according to current medical practices, but problems sometimes occur. Call your health care provider if you have any problems or questions after your procedure. WHAT TO EXPECT AFTER THE PROCEDURE After your procedure, it is typical  to feel:  Soreness in your throat.  Pain with swallowing.  Sick to your stomach (nauseous).  Bloated.  Dizzy.  Fatigued. HOME CARE INSTRUCTIONS  Do not eat or drink anything until the numbing medicine (local anesthetic) has worn off and your gag reflex has returned. You will know that the local anesthetic has worn off when you can swallow comfortably.  Do not drive or operate machinery until directed by your health care provider.  Take medicines only as directed by your health care provider. SEEK MEDICAL CARE IF:   You cannot stop coughing.  You are not urinating at all or less than usual. SEEK IMMEDIATE MEDICAL CARE IF:  You have difficulty swallowing.  You cannot eat or drink.  You have worsening throat or chest pain.  You have dizziness or lightheadedness or you faint.  You have nausea or vomiting.  You have chills.  You have a fever.  You have severe abdominal pain.  You have black, tarry, or bloody stools.   This information is not intended to replace advice given to you by your health care provider. Make sure you discuss any questions you have with your health care  provider.   Document Released: 07/13/2012 Document Revised: 08/17/2014 Document Reviewed: 07/13/2012 Elsevier Interactive Patient Education 2016 [employer]. Colonoscopy A colonoscopy is an exam to look at the entire large intestine (colon). This exam can help find problems such as tumors, polyps, inflammation, and areas of bleeding. The exam takes about 1 hour.  LET [facility] CARE PROVIDER KNOW ABOUT:   Any allergies you have.  All medicines you are taking, including vitamins, herbs, eye drops, creams, and over-the-counter medicines.  Previous problems you or members of your family have had with the use of anesthetics.  Any blood disorders you have.  Previous surgeries you have had.  Medical conditions you have. RISKS AND COMPLICATIONS  Generally, this is a safe procedure.  However, as with any procedure, complications can occur. Possible complications include:  Bleeding.  Tearing or rupture of the colon wall.  Reaction to medicines given during the exam.  Infection (rare). BEFORE THE PROCEDURE   Ask your health care provider about changing or stopping your regular medicines.  You may be prescribed an oral bowel prep. This involves drinking a large amount of medicated liquid, starting the day before your procedure. The liquid will cause you to have multiple loose stools until your stool is almost clear or light green. This cleans out your colon in preparation for the procedure.  Do not eat or drink anything else once you have started the bowel prep, unless your health care provider tells you it is safe to do so.  Arrange for someone to drive you home after the procedure. PROCEDURE   You will be given medicine to help you relax (sedative).  You will lie on your side with your knees bent.  A long, flexible tube with a light and camera on the end (colonoscope) will be inserted through the rectum and into the colon. The camera sends video back to a computer screen as it moves through the colon. The colonoscope also releases carbon dioxide gas to inflate the colon. This helps your health care provider see the area better.  During the exam, your health care provider may take a small tissue sample (biopsy) to be examined under a microscope if any abnormalities are found.  The exam is finished when the entire colon has been viewed. AFTER THE PROCEDURE   Do not drive for 24 hours after the exam.  You may have a small amount of blood in your stool.  You may pass moderate amounts of gas and have mild abdominal cramping or bloating. This is caused by the gas used to inflate your colon during the exam.  Ask when your test results will be ready and how you will get your results. Make sure you get your test results.   This information is not intended to replace  advice given to you by your health care provider. Make sure you discuss any questions you have with your health care provider.   Document Released: 07/24/2000 Document Revised: 05/17/2013 Document Reviewed: 04/03/2013 Elsevier Interactive Patient Education 2016 [employer] Colonoscopy, Care After Refer to this sheet in the next few weeks. These instructions provide you with information on caring for yourself after your procedure. Your health care provider may also give you more specific instructions. Your treatment has been planned according to current medical practices, but problems sometimes occur. Call your health care provider if you have any problems or questions after your procedure. WHAT TO EXPECT AFTER THE PROCEDURE  After your procedure, it is typical to have the following:  A small amount of blood in your stool.  Moderate amounts of gas and mild abdominal cramping or bloating. HOME CARE INSTRUCTIONS  Do not drive, operate machinery, or sign important documents for 24 hours.  You may shower and resume your regular physical activities, but move at a slower pace for the first 24 hours.  Take frequent rest periods for the first 24 hours.  Walk around or put a warm pack on your abdomen to help reduce abdominal cramping and bloating.  Drink enough fluids to keep your urine clear or pale yellow.  You may resume your normal diet as instructed by your health care provider. Avoid heavy or fried foods that are hard to digest.  Avoid drinking alcohol for 24 hours or as instructed by your health care provider.  Only take over-the-counter or prescription medicines as directed by your health care provider.  If a tissue sample (biopsy) was taken during your procedure:  Do not take aspirin or blood thinners for 7 days, or as instructed by your health care provider.  Do not drink alcohol for 7 days, or as instructed by your health care provider.  Eat soft foods for the first 24  hours. SEEK MEDICAL CARE IF: You have persistent spotting of blood in your stool 2-3 days after the procedure. SEEK IMMEDIATE MEDICAL CARE IF:  You have more than a small spotting of blood in your stool.  You pass large blood clots in your stool.  Your abdomen is swollen (distended).  You have nausea or vomiting.  You have a fever.  You have increasing abdominal pain that is not relieved with medicine.   This information is not intended to replace advice given to you by your health care provider. Make sure you discuss any questions you have with your health care provider.   Document Released: 03/10/2004 Document Revised: 05/17/2013 Document Reviewed: 04/03/2013 Elsevier Interactive Patient Education 2016 Elsevier Inc. PATIENT INSTRUCTIONS POST-ANESTHESIA  IMMEDIATELY FOLLOWING SURGERY:  Do not drive or operate machinery for the first twenty four hours after surgery.  Do not make any important decisions for twenty four hours after surgery or while taking narcotic pain medications or sedatives.  If you develop intractable nausea and vomiting or a severe headache please notify your doctor immediately.  FOLLOW-UP:  Please make an appointment with your surgeon as instructed. You do not need to follow up with anesthesia unless specifically instructed to do so.  WOUND CARE INSTRUCTIONS (if applicable):  Keep a dry clean dressing on the anesthesia/puncture wound site if there is drainage.  Once the wound has quit draining you may leave it open to air.  Generally you should leave the bandage intact for twenty four hours unless there is drainage.  If the epidural site drains for more than 36-48 hours please call the anesthesia department.  QUESTIONS?:  Please feel free to call your physician or the hospital operator if you have any questions, and they will be happy to assist you.

## 2015-12-10 NOTE — Telephone Encounter (Signed)
Pt called- he spoke with [employer] yesterday and pt has been approved to get the grant for harvoni. I also received a fax today from [email or web address], saying he was approved. I informed pt that the medication should be delivered to our office but if there is a mistake, and he receives the medication at home, he should not take it until he calls the office and gets directions from [name]. Pt verbalized understanding.  Vicente Males, what instructions do you want [name] to give the pt?

## 2015-12-11 ENCOUNTER — Inpatient Hospital Stay (HOSPITAL_COMMUNITY): Admission: RE | Admit: 2015-12-11 | Payer: Self-pay | Source: Ambulatory Visit

## 2015-12-11 NOTE — Telephone Encounter (Signed)
Pt called, he did receive his harvoni today at his house. I advised him to not take it until he hears back from me with instructions. Pt stated he wouldn't take it until I call him.   Marc [name], what instructions do you want the pt to have?

## 2015-12-12 NOTE — Telephone Encounter (Signed)
OV made and appt card mailed °

## 2015-12-12 NOTE — Telephone Encounter (Signed)
Take Harvoni once each day for 12 weeks. IF he can do without Prilosec, would be best not to take it. However, if he has to have it, he needs to take Wallace at the same exact time every day. No other OTC medications. Return in 4 weeks for office visit and labs.

## 2015-12-12 NOTE — Telephone Encounter (Signed)
Pt is aware. Please schedule ov in 4 weeks.

## 2015-12-13 ENCOUNTER — Telehealth: Payer: Self-pay

## 2015-12-13 NOTE — Telephone Encounter (Signed)
Called pt and LMOM to call office back to update H&P

## 2015-12-13 NOTE — Telephone Encounter (Signed)
Error pt cancelled pt.

## 2015-12-17 ENCOUNTER — Ambulatory Visit (HOSPITAL_COMMUNITY): Admission: RE | Admit: 2015-12-17 | Payer: Medicare HMO | Source: Ambulatory Visit | Admitting: Gastroenterology

## 2015-12-17 ENCOUNTER — Encounter (HOSPITAL_COMMUNITY): Admission: RE | Payer: Self-pay | Source: Ambulatory Visit

## 2015-12-17 SURGERY — COLONOSCOPY WITH PROPOFOL
Anesthesia: Monitor Anesthesia Care

## 2015-12-18 ENCOUNTER — Telehealth: Payer: Self-pay | Admitting: Family Medicine

## 2015-12-18 NOTE — Telephone Encounter (Signed)
lm to call back to schedule July physical

## 2015-12-19 NOTE — Telephone Encounter (Signed)
Discussed with Dr. Oneida Alar. Spleen normal on [name]. Likely does not have cirrhosis. Await EGD findings. Continue with current plan. Let's check with [employer] non-urgently regarding the feasibility of extending the course IF he falls into the cirrhosis category.

## 2015-12-19 NOTE — Telephone Encounter (Addendum)
UPDATE:  He needs to take Harvoni for 24 weeks, not 12. He is treatment-experienced. Although US abdomen does not specifically say cirrhosis, he has thrombocytopenia and concern remains for cirrhosis. EGD/TCS needs to be completed soon, as EGD can show changes of portal hypertension.  He is coming June 1st to see me. At that point, we will arrange TCS/EGD.

## 2015-12-19 NOTE — Telephone Encounter (Signed)
Please see addendum.

## 2015-12-21 NOTE — Telephone Encounter (Signed)
WOULD CALL DAWN [place] AND DISCUSS CASE WITH HER AS WELL.

## 2015-12-23 ENCOUNTER — Telehealth (HOSPITAL_COMMUNITY): Payer: Self-pay | Admitting: *Deleted

## 2015-12-23 ENCOUNTER — Other Ambulatory Visit (HOSPITAL_COMMUNITY): Payer: Self-pay | Admitting: Psychiatry

## 2015-12-23 MED ORDER — CLONAZEPAM 0.5 MG PO TABS: 0.5000 mg | ORAL_TABLET | Freq: Three times a day (TID) | ORAL | Status: DC | PRN

## 2015-12-23 NOTE — Telephone Encounter (Signed)
Called pt to inform him that his printed script is ready for pick up. Per pt, he already have his script and he received it today and it has 2 refills on it. Called pt pharmacy and was informed by pharmacist that the reason pt have his Clonazepam to is due to them filling an old script that was dated for back in February of this year and it has 2 refills on it.

## 2015-12-23 NOTE — Telephone Encounter (Signed)
I spoke with Marc [name] at [facility], he said it would be extremely hard to get harvoni approved for the extra 12 weeks. He said they would try but he didn't think it would happen.  He said make sure we get a viral load at 4 weeks and they would need documentation of the cirrhosis.  I told him I would call him back if we needed him.

## 2015-12-23 NOTE — Telephone Encounter (Signed)
Please shred it

## 2015-12-23 NOTE — Telephone Encounter (Signed)
voice message from patient, he need refill of clonazePAM.

## 2015-12-23 NOTE — Telephone Encounter (Signed)
Printed. He needs appt

## 2015-12-24 ENCOUNTER — Other Ambulatory Visit (HOSPITAL_COMMUNITY): Payer: Self-pay | Admitting: Psychiatry

## 2015-12-24 ENCOUNTER — Telehealth (HOSPITAL_COMMUNITY): Payer: Self-pay | Admitting: *Deleted

## 2015-12-24 MED ORDER — QUETIAPINE FUMARATE 50 MG PO TABS: ORAL_TABLET | ORAL | Status: DC

## 2015-12-24 NOTE — Telephone Encounter (Signed)
noted 

## 2015-12-24 NOTE — Telephone Encounter (Signed)
Pt pharmacy requesting refills for pt QUEtiapine (SEROQUEL) 50 MG via e-scribe. Pt medication last filled 09-25-2015 with 2 refills. Pt had appt for 11-22-15 but provider out of office. Pt is rescheduled for f/u 01-14-2016.

## 2015-12-24 NOTE — Telephone Encounter (Signed)
sent 

## 2016-01-08 NOTE — Telephone Encounter (Signed)
REVIEWED. AGREE. NO ADDITIONAL RECOMMENDATIONS. 

## 2016-01-08 NOTE — Telephone Encounter (Signed)
Routing to Doris, this is an [facility] pt. 

## 2016-01-08 NOTE — Telephone Encounter (Signed)
Spoke with [employer]   We need to get either an elastography or the fibrosure test. If F2/F3, no issues with the 12 weeks. If F3/F4, may be ok with still doing the 12 weeks. If unable to extend to 24 weeks, will need Ribavirin.   Let's get an elastography as soon as possible so we know where we stand.

## 2016-01-09 ENCOUNTER — Ambulatory Visit: Payer: Self-pay | Admitting: Gastroenterology

## 2016-01-09 ENCOUNTER — Other Ambulatory Visit: Payer: Self-pay

## 2016-01-09 DIAGNOSIS — B1920 Unspecified viral hepatitis C without hepatic coma: Secondary | ICD-10-CM

## 2016-01-09 NOTE — Telephone Encounter (Signed)
Pt called back and is aware of appt for ultrasound

## 2016-01-09 NOTE — Telephone Encounter (Signed)
LMOM for a return call. Routing to [facility] to schedule the elasticity test.

## 2016-01-09 NOTE — Telephone Encounter (Signed)
Called pt and LMOM with [name] appt. Pt is set for 01/17/2016 @ 0815

## 2016-01-14 ENCOUNTER — Encounter (HOSPITAL_COMMUNITY): Payer: Self-pay | Admitting: Psychiatry

## 2016-01-14 ENCOUNTER — Ambulatory Visit (INDEPENDENT_AMBULATORY_CARE_PROVIDER_SITE_OTHER): Payer: Medicare HMO | Admitting: Psychiatry

## 2016-01-14 VITALS — BP 119/72 | HR 92 | Ht 70.0 in | Wt 171.6 lb

## 2016-01-14 DIAGNOSIS — F1920 Other psychoactive substance dependence, uncomplicated: Secondary | ICD-10-CM

## 2016-01-14 DIAGNOSIS — F331 Major depressive disorder, recurrent, moderate: Secondary | ICD-10-CM

## 2016-01-14 MED ORDER — QUETIAPINE FUMARATE 50 MG PO TABS: ORAL_TABLET | ORAL | Status: DC

## 2016-01-14 MED ORDER — GABAPENTIN 400 MG PO CAPS: 400.0000 mg | ORAL_CAPSULE | Freq: Three times a day (TID) | ORAL | Status: DC

## 2016-01-14 MED ORDER — CLONAZEPAM 0.5 MG PO TABS: 0.5000 mg | ORAL_TABLET | Freq: Three times a day (TID) | ORAL | Status: DC | PRN

## 2016-01-14 MED ORDER — FLUOXETINE HCL 20 MG PO CAPS: 20.0000 mg | ORAL_CAPSULE | Freq: Every day | ORAL | Status: DC

## 2016-01-14 NOTE — Progress Notes (Signed)
Patient ID: SHAWN ROCKS, male   DOB: February 24, 1956, 60 y.o.   MRN: [id number] Patient ID: RUFE RUTKOWSKI, male   DOB: 24-Jul-1956, 60 y.o.   MRN: [id number] Patient ID: TRAMINE HIGGS, male   DOB: Feb 21, 1956, 60 y.o.   MRN: [id number] Patient ID: TRANDON LONGTIN, male   DOB: [birth year], 60 y.o.   MRN: [id number] Patient ID: ROBEL POTTHOFF, male   DOB: 1956-07-09, 60 y.o.   MRN: [id number] Patient ID: SYLVAIN BROCCOLI, male   DOB: Oct 17, 1955, 60 y.o.   MRN: [id number] Patient ID: KELDRICK TIFFANY, male   DOB: April 12, 1956, 60 y.o.   MRN: [id number] Patient ID: MIKAL DEGRANDE, male   DOB: Jul 04, 1956, 60 y.o.   MRN: [id number] Patient ID: MARQUIESE CASSANI, male   DOB: 03-Jun-1956, 60 y.o.   MRN: [id number] Patient ID: MERRIK HAYMAN, male   DOB: March 16, 1956, 60 y.o.   MRN: [id number] Patient ID: AVERY MILLSTEIN, male   DOB: [birth year], 60 y.o.   MRN: [id number] Patient ID: MUHAMMADYUSUF RAZI, male   DOB: October 07, 1955, 60 y.o.   MRN: [id number] Patient ID: FEDRICK SOUTHWARD, male   DOB: [birth year], 60 y.o.   MRN: [id number] Patient ID: ARIANA RICHWINE, male   DOB: [birth year], 60 y.o.   MRN: [id number] [facility] [id number] Progress Note VARDAAN LEIDIG MRN: [id number] DOB: [birth year] Age: 60 y.o.  Date: 01/14/2016   Chief Complaint: Chief Complaint  Patient presents with  . Follow-up    per pt he stoppped his Cymbalta due to it not agreeing with him and started back on the Prozac  . Depression  . 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 He has one son age 35 years old and lives in [place]  The patient states he has a history depression that started around 2009. Prior to that year he been treated with ribavirin to treat hepatitis C which made him very depressed. He's also had a number of deaths in his family-both sisters have died as well as both parents in the last several years. His  son is very close to him and he only talks to him periodically.  Currently the patient is struggling with medical problems including neck and back pain. He recently had a neurostimulator installed in his back which is helped to some degree. Lately he's been a bit more depressed because his aunt keeps calling and wanting to talk about all the dead relatives. He's not been seeing his counselor regularly and I think it would be helpful for him to get back into counseling. He does have friends and he walks his dog 2 miles a day. He admits to passive suicidal ideation but would never hurt himself.  The patient returns for followup after 3 months. He states that overall he is doing fairly well. He is now on Harvony for treatment of hepatitis C. It is making him a little bit tired and he has more vivid dreams but other than that he's not had significant side effects. He is almost done with his first 30 days. His mood is been stable and overall he feels like his medications have helped his depression and anxiety   Past psychiatric history Patient has history of depression for many years. He has taken in the past Wellbutrin Cymbalta and Celexa. He denies any history of  suicidal attempt.  Alcohol and substance use history Patient has significant history of alcohol with multiple rehabilitation and detox treatment. He has history of DWI. He also had history of taking recreational drugs and he was in the [employer]. He has history of intravenous drug use that cause hepatitis C. Patient also had history of using pain medication.  Allergies: Allergies  Allergen Reactions  . Levofloxacin Nausea Only and Other (See Comments)    "Creepy" feeling, skin didn't feel right, sort of itchy.  Baker Pierini [Lubiprostone]     MADE HIM FEEL STIFF  . Ciprofloxacin     Other reaction(s): Sweating (intolerance)  . Sulfa Antibiotics    Medical History: Past Medical History  Diagnosis Date  . Hepatitis C reactive     LIVER BX  2008-CHRONIC ACTIVE HEPATITIS  . Back problem   . GERD (gastroesophageal reflux disease)   . IBS (irritable bowel syndrome)   . HTN (hypertension)   . Hypercholesterolemia   . Hx of adenomatous colonic polyps 2008    due for surveillance 2017  . Knee pain   . Substance abuse     narcotic addiction  . Thyroiditis     History of  . Normal echocardiogram 05/25/2011    EF 55%, mild TR  . Normal cardiac stress test 05/25/2011    [facility] [place]  . Depression   . Hepatitis C 1979  . Allergy   . Anxiety   . Arthritis   . Leukopenia 11/06/2015  . Thrombocytopenia ([place]) 11/06/2015  Cervical fusion Neck surgery Neck pain Hepatitis C Hypertension Benign tremors High blood pressure He sees Dr. Moshe Cipro and Dr. Paulita Cradle for pain management.  Surgical History: Past Surgical History  Procedure Laterality Date  . Appendectomy    . Tonsillectomy    . Carpal tunnel release      rt  . Right ring finger    . Spinal stenosis, had screws put in neck  04/11/2009    DR KRITZER  . Upper gastrointestinal endoscopy  2008 [facility] ABD PAIN WEIGHT LOSS d100 v8 phen 12.5    NL  . Colonoscopy  2008 [facility] ARS D100 V8 PHEN 12.5    2 SIMPLE ADENOMAS (< 1 CM)  . Knee arthroscopy    . Ulnar nerve transposition    . Esophagogastroduodenoscopy  04/26/2012    Dr. Oneida Alar: Non-erosive gastritis (inflammation) was found in the gastric antrum but no H.pylori; multiple biopsies (duodenal bx negative for Celiac)/ The mucosa of the esophagus appeared normal  . Eus  09/08/2012    Dr. Ardis Hughs: CBD dilated but no stones. Query secondary to Sphincter of Oddi stenosis, ?dysfunction, but clinically without symptoms  . Neurostimulator implant    . Wrist surgery Right jan 2015    Dr. Edmonia Lynch  . Hardware removal Right 02/12/2014    Procedure: HARDWARE REMOVAL;  Surgeon: Jolyn Nap, MD;  Location: [place];  Service: Orthopedics;  Laterality: Right;  . Ulna osteotomy Right 02/12/2014     Procedure: RIGHT ULNAR SHORTENING AND OSTEOTOMY ;  Surgeon: Jolyn Nap, MD;  Location: [place];  Service: Orthopedics;  Laterality: Right;   Family History: family history includes ADD / ADHD in his cousin; Alcohol abuse in his cousin, father, and paternal uncle; Anxiety disorder in his cousin; Bipolar disorder in his cousin; Cancer in his mother and sister; Colon cancer in his maternal grandfather; Dementia in his paternal aunt, paternal grandmother, and paternal uncle; Depression in his cousin; Emotional abuse  in his sister and sister; Heart failure in his father; OCD in his cousin; Paranoid behavior in his cousin; Stroke in his father and sister. There is no history of Colon polyps, Drug abuse, Schizophrenia, Seizures, Sexual abuse, or Physical abuse. Reviewed again in the office and no changes were noted except the new pain issues  Psychosocial history Patient was born and raised in [place].  He married twice. 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 Patient has multiple family member who has died in past few years. His father died in 12-09-07 and mother died in Dec 08, 2008  and 2 of his sister also died.  Patient lives by himself.  Mental status examination Patient is casually dressed and groomed.Marland KitchenHe maintained fair eye contact. He described his mood as ok   His speech is slow but clear. His thought process is slow but logical linear and goal-directed. Marland Kitchen  He denies any active or passive suicidal thinking or homicidal thinking. He denies any auditory or visual hallucination. He is alert and oriented x3.  His attention and concentration is distracted. There were no paranoia or delusions present at this time. His insight judgment and impulse control is okay. Review of systems is positive for chronic back pain and pain in his left hand Diagnosis Axis I Maj. depressive disorder, polysubstance dependence by history Axis II deferred Axis III see  medical history Axis IV mild to moderate Axis V 65-70  Plan/Discussion: I took his vitals.  I reviewed CC, tobacco/med/surg Hx, meds effects/ side effects, problem list, therapies and responses as well as current situation/symptoms discussed options. He will continue clonazepam for anxiety,  Seroquel for mood stabilization, Neurontin for anxiety. He'll continue  Prozac 20 mg daily mg daily Continue his counseling and return in 3 months or call if needed sooner  See orders and pt instructions for more details.  Medical Decision Making Problem Points:  Established problem, stable/improving (1), Review of last therapy session (1) and Review of psycho-social stressors (1) Data Points:  Review or order clinical lab tests (1) Review of medication regiment & side effects (2)  I certify that outpatient services furnished can reasonably be expected to improve the patient's condition.   [name], MD

## 2016-01-17 ENCOUNTER — Ambulatory Visit (HOSPITAL_COMMUNITY)
Admission: RE | Admit: 2016-01-17 | Discharge: 2016-01-17 | Disposition: A | Discharge status: Home / Self Care | Payer: Medicare HMO | Source: Ambulatory Visit | Attending: Gastroenterology | Admitting: Gastroenterology

## 2016-01-17 DIAGNOSIS — B1920 Unspecified viral hepatitis C without hepatic coma: Secondary | ICD-10-CM | POA: Diagnosis present

## 2016-01-17 DIAGNOSIS — K760 Fatty (change of) liver, not elsewhere classified: Secondary | ICD-10-CM | POA: Diagnosis not present

## 2016-01-22 NOTE — Progress Notes (Signed)
Quick Note:  F3/F4 on elastography. Will discuss at visit on 6/16. Have already reached out to [facility] regarding need for extending treatment an additional 12 weeks. Without liver biopsy, uncertain if truly has cirrhosis or not. Elastography tends to over call. Will see him in a few days and follow-up with how he is doing. ______

## 2016-01-23 NOTE — Telephone Encounter (Signed)
Reviewed with Roosevelt Locks. Will continue with 12 weeks of harvoni as originally ordered.

## 2016-01-24 ENCOUNTER — Ambulatory Visit (INDEPENDENT_AMBULATORY_CARE_PROVIDER_SITE_OTHER): Payer: Medicare HMO | Admitting: Gastroenterology

## 2016-01-24 ENCOUNTER — Encounter: Payer: Self-pay | Admitting: Gastroenterology

## 2016-01-24 ENCOUNTER — Other Ambulatory Visit: Payer: Self-pay

## 2016-01-24 VITALS — BP 130/86 | HR 96 | Temp 98.3°F | Ht 68.0 in | Wt 171.8 lb

## 2016-01-24 DIAGNOSIS — K5903 Drug induced constipation: Secondary | ICD-10-CM | POA: Diagnosis not present

## 2016-01-24 DIAGNOSIS — T402X5A Adverse effect of other opioids, initial encounter: Secondary | ICD-10-CM

## 2016-01-24 DIAGNOSIS — B182 Chronic viral hepatitis C: Secondary | ICD-10-CM

## 2016-01-24 DIAGNOSIS — Z860100 Personal history of colon polyps, unspecified: Secondary | ICD-10-CM

## 2016-01-24 LAB — COMPLETE METABOLIC PANEL WITH GFR
ALT: 62 U/L — ABNORMAL HIGH (ref 9–46)
AST: 61 U/L — ABNORMAL HIGH (ref 10–35)
Albumin: 3.8 g/dL (ref 3.6–5.1)
Alkaline Phosphatase: 60 U/L (ref 40–115)
BUN: 18 mg/dL (ref 7–25)
CO2: 24 mmol/L (ref 20–31)
Calcium: 9.1 mg/dL (ref 8.6–10.3)
Chloride: 103 mmol/L (ref 98–110)
Creat: 1.12 mg/dL (ref 0.70–1.25)
GFR, EST NON AFRICAN AMERICAN: 70 mL/min (ref >=60)
GFR, Est African American: 81 mL/min (ref >=60)
GLUCOSE: 101 mg/dL — AB (ref 65–99)
POTASSIUM: 4.2 mmol/L (ref 3.5–5.3)
SODIUM: 137 mmol/L (ref 135–146)
TOTAL PROTEIN: 6.8 g/dL (ref 6.1–8.1)
Total Bilirubin: 0.5 mg/dL (ref 0.2–1.2)

## 2016-01-24 LAB — CBC
HCT: 40.4 % (ref 38.5–50.0)
Hemoglobin: 13.5 g/dL (ref 13.2–17.1)
MCH: 33.5 pg — AB (ref 27.0–33.0)
MCHC: 33.4 g/dL (ref 32.0–36.0)
MCV: 100.2 fL — ABNORMAL HIGH (ref 80.0–100.0)
MPV: 10.5 fL (ref 7.5–12.5)
PLATELETS: 101 10*3/uL — AB (ref 140–400)
RBC: 4.03 MIL/uL — AB (ref 4.20–5.80)
RDW: 13.6 % (ref 11.0–15.0)
WBC: 3.6 10*3/uL — ABNORMAL LOW (ref 3.8–10.8)

## 2016-01-24 MED ORDER — PEG 3350-KCL-NA BICARB-NACL 420 G PO SOLR: 4000.0000 mL | Freq: Once | ORAL | Status: DC

## 2016-01-24 NOTE — Progress Notes (Signed)
Referring Provider: Fayrene Helper, MD Primary Care Physician:  Tula Nakayama, MD  Primary GI: Dr. Oneida Alar   Chief Complaint  Patient presents with  . Follow-up    HPI:   Marc [name] is a 60 y.o. male presenting today with a history of chronic Hep C genotype 1a with elastography showing F3/F4,  GERD, IBS, history of adenomas, and due for surveillance colonoscopy now. He started Harvoni in May 2017 and due for labs during treatment for [place]. He has completed about 4 weeks worth.   Had a headache at beginning of treatment but went away. Has weakness, fatigue at some times. Difficulty sleeping every now and then. Had only one day of nausea, otherwise doing well. Taking Senna for constipation. Movantik extremely expensive. Amitiza caused nausea. Never Tried Linzess. Quit Prilosec while on Harvoni, has "suffered a little for that".    Past Medical History  Diagnosis Date  . Hepatitis C reactive     LIVER BX 2008-CHRONIC ACTIVE HEPATITIS  . Back problem   . GERD (gastroesophageal reflux disease)   . IBS (irritable bowel syndrome)   . HTN (hypertension)   . Hypercholesterolemia   . Hx of adenomatous colonic polyps 2008    due for surveillance 2017  . Knee pain   . Substance abuse     narcotic addiction  . Thyroiditis     History of  . Normal echocardiogram 05/25/2011    EF 55%, mild TR  . Normal cardiac stress test 05/25/2011    [facility] [place]  . Depression   . Hepatitis C 1979  . Allergy   . Anxiety   . Arthritis   . Leukopenia 11/06/2015  . Thrombocytopenia ([place]) 11/06/2015    Past Surgical History  Procedure Laterality Date  . Appendectomy    . Tonsillectomy    . Carpal tunnel release      rt  . Right ring finger    . Spinal stenosis, had screws put in neck  04/11/2009    DR KRITZER  . Upper gastrointestinal endoscopy  2008 [facility] ABD PAIN WEIGHT LOSS d100 v8 phen 12.5    NL  . Colonoscopy  2008 [facility] ARS D100 V8 PHEN 12.5    2 SIMPLE  ADENOMAS (< 1 CM)  . Knee arthroscopy    . Ulnar nerve transposition    . Esophagogastroduodenoscopy  04/26/2012    Dr. Oneida Alar: Non-erosive gastritis (inflammation) was found in the gastric antrum but no H.pylori; multiple biopsies (duodenal bx negative for Celiac)/ The mucosa of the esophagus appeared normal  . Eus  09/08/2012    Dr. Ardis Hughs: CBD dilated but no stones. Query secondary to Sphincter of Oddi stenosis, ?dysfunction, but clinically without symptoms  . Neurostimulator implant    . Wrist surgery Right jan 2015    Dr. Edmonia Lynch  . Hardware removal Right 02/12/2014    Procedure: HARDWARE REMOVAL;  Surgeon: Jolyn Nap, MD;  Location: [place];  Service: Orthopedics;  Laterality: Right;  . Ulna osteotomy Right 02/12/2014    Procedure: RIGHT ULNAR SHORTENING AND OSTEOTOMY ;  Surgeon: Jolyn Nap, MD;  Location: [place];  Service: Orthopedics;  Laterality: Right;    Current Outpatient Prescriptions  Medication Sig Dispense Refill  . acetaminophen (TYLENOL) 500 MG tablet Take 500 mg by mouth as needed.    Marland Kitchen azelastine (ASTELIN) 0.1 % nasal spray Place 2 sprays into both nostrils 2 (two) times daily. Use in each nostril as  directed 30 mL 12  . clonazePAM (KLONOPIN) 0.5 MG tablet Take 1 tablet (0.5 mg total) by mouth 3 (three) times daily as needed for anxiety. 90 tablet 2  . cloNIDine (CATAPRES) 0.2 MG tablet TAKE (1) TABLET BY MOUTH TWICE DAILY. 60 tablet 2  . cyclobenzaprine (FLEXERIL) 10 MG tablet Take 1 tablet (10 mg total) by mouth at bedtime. 30 tablet 4  . FLUoxetine (PROZAC) 20 MG capsule Take 1 capsule (20 mg total) by mouth daily. 30 capsule 2  . gabapentin (NEURONTIN) 400 MG capsule Take 1 capsule (400 mg total) by mouth 3 (three) times daily. 90 capsule 2  . HYDROcodone-acetaminophen (NORCO) 7.5-325 MG per tablet Take 1 tablet by mouth 3 (three) times daily.    Marland Kitchen ibuprofen (ADVIL,MOTRIN) 200 MG tablet Take 400-600 mg by mouth every  6 (six) hours as needed. For pain    . Lactobacillus (DIGESTIVE HEALTH PROBIOTIC PO) Take 1 capsule by mouth daily.    . Ledipasvir-Sofosbuvir (HARVONI) 90-400 MG TABS Take 1 tablet by mouth daily. 90 tablet 0  . loratadine (CLARITIN) 10 MG tablet Take 10 mg by mouth daily.    . mometasone (NASONEX) 50 MCG/ACT nasal spray Place 2 sprays into the nose daily. 17 g 12  . naloxegol oxalate (MOVANTIK) 25 MG TABS tablet Take 1 tablet (25 mg total) by mouth daily. 30 tablet 3  . polyethylene glycol-electrolytes (NULYTELY/GOLYTELY) 420 g solution Take 4,000 mLs by mouth once. 4000 mL 0  . QUEtiapine (SEROQUEL) 50 MG tablet Take by mouth 1 tablet 3 times daily and 2 at bedtime 150 tablet 2  . senna (SENOKOT) 8.6 MG TABS tablet Take 1 tablet by mouth daily as needed for mild constipation.     No current facility-administered medications for this visit.    Allergies as of 01/24/2016 - Review Complete 01/24/2016  Allergen Reaction Noted  . Levofloxacin Nausea Only and Other (See Comments)   . Amitiza [lubiprostone]  07/22/2012  . Ciprofloxacin  06/13/2015  . Sulfa antibiotics  12/02/2012    Family History  Problem Relation Age of Onset  . Heart defect      FAMILY HX  . Cancer Mother     BLADDER  . Cancer Sister     METS  . Emotional abuse Sister   . Stroke Sister   . Emotional abuse Sister   . Heart failure Father   . Stroke Father   . Alcohol abuse Father   . Colon polyps Neg Hx   . Drug abuse Neg Hx   . Schizophrenia Neg Hx   . Seizures Neg Hx   . Sexual abuse Neg Hx   . Physical abuse Neg Hx   . Dementia Paternal Aunt   . Alcohol abuse Paternal Uncle   . Dementia Paternal Uncle   . Dementia Paternal Grandmother   . ADD / ADHD Cousin   . Bipolar disorder Cousin   . Anxiety disorder Cousin   . Depression Cousin     Committed suicide  . Alcohol abuse Cousin   . OCD Cousin   . Paranoid behavior Cousin   . Colon cancer Maternal Grandfather     Social History   Social  History  . Marital Status: Divorced    Spouse Name: N/A  . Number of Children: N/A  . Years of Education: N/A   Social History Main Topics  . Smoking status: Former Smoker -- 1.50 packs/day for 8 years    Types: Cigarettes    Quit date: 08/13/1986  .  Smokeless tobacco: Never Used     Comment: quit 20 + yrs ago  . Alcohol Use: No  . Drug Use: No  . Sexual Activity: No   Other Topics Concern  . None   Social History Narrative    Review of Systems: As mentioned in HPI    Physical Exam: BP 130/86 mmHg  Pulse 96  Temp(Src) 98.3 F (36.8 C) (Oral)  Ht 5\' 8"  (1.727 m)  Wt 171 lb 12.8 oz (77.928 kg)  BMI 26.13 kg/m2 General:   Alert and oriented. No distress noted. Pleasant and cooperative.  Head:  Normocephalic and atraumatic. Eyes:  Conjuctiva clear without scleral icterus. Mouth:  Oral mucosa pink and moist. Good dentition. No lesions. Heart:  S1, S2 present without murmurs, rubs, or gallops. Regular rate and rhythm. Abdomen:  +BS, soft, non-tender and non-distended. No rebound or guarding. No HSM or masses noted. Msk:  Symmetrical without gross deformities. Normal posture. Extremities:  Without edema. Neurologic:  Alert and  oriented x4;  grossly normal neurologically. Psych:  Alert and cooperative. Normal mood and affect.   Lab Results  Component Value Date   WBC 2.3* 11/06/2015   HGB 12.9* 11/06/2015   HCT 37.3* 11/06/2015   MCV 100.8* 11/06/2015   PLT 80* 11/06/2015   Lab Results  Component Value Date   ALT 120* 11/06/2015   AST 126* 11/06/2015   ALKPHOS 72 11/06/2015   BILITOT 0.8 11/06/2015

## 2016-01-24 NOTE — Assessment & Plan Note (Signed)
60 year old male with history of 2 adenomas in 2008, due for surveillance now. Chronic constipation likely opioid-induced. "see constipation". No concerning lower GI symptoms.  Proceed with colonoscopy with Dr. Oneida Alar in the near future. The risks, benefits, and alternatives have been discussed in detail with the patient. They state understanding and desire to proceed.  PROPOFOL due to polypharmacy

## 2016-01-24 NOTE — Patient Instructions (Signed)
We have scheduled you for a colonoscopy and upper endoscopy with Dr. Oneida Alar in the near future.  Please have blood work done today.  I will see you in about 3 months.  For constipation: start taking Linzess 290 mcg once each morning on an empty stomach, at least 30 minutes before a meal.

## 2016-01-24 NOTE — Assessment & Plan Note (Signed)
Failed Amitiza and OTC agents. Movantik expensive. Trial Linzess 290 mcg once daily. Patient to call if he would like a prescription.

## 2016-01-24 NOTE — Progress Notes (Signed)
Quick Note:  Pt was seen at the office with Laban Emperor', NP. ______

## 2016-01-24 NOTE — Assessment & Plan Note (Signed)
Harvoni started 4 weeks ago, doing well overall. Chronic thrombocytopenia and leukopenia followed by Hematology, with ultrasound showing normal spleen and recent Metavir score of F3/F4. Discussed with Roosevelt Locks, who states 12 weeks of Harvoni should be sufficient. EGD to be scheduled at time of colonoscopy for screening purposes and assess for any changes of portal hypertension.  Proceed with upper endoscopy in the near future with Dr. Oneida Alar. The risks, benefits, and alternatives have been discussed in detail with patient. They have stated understanding and desire to proceed.  PROPOFOL due to polypharmacy

## 2016-01-26 NOTE — Progress Notes (Signed)
Quick Note:  LFTs improving on Harvoni. Still with thrombocytopenia, leukopenia, chronic. HCV RNA remains pending. ______

## 2016-01-27 LAB — HEPATITIS C RNA QUANTITATIVE: HCV Quantitative: NOT DETECTED IU/mL (ref <15)

## 2016-01-27 NOTE — Progress Notes (Signed)
CC'D TO PCP °

## 2016-01-28 NOTE — Progress Notes (Signed)
Quick Note:  Pt is aware. ______ 

## 2016-02-05 NOTE — Progress Notes (Signed)
Quick Note:  HCV RNA undetected. CONTINUE HARVONI for full treatment course. I will see him in Sept. ______

## 2016-02-06 NOTE — Progress Notes (Signed)
Quick Note:  Pt is aware of results and will keep appt in September. ______

## 2016-02-17 NOTE — Patient Instructions (Signed)
Marc [name]  02/17/2016     @PREFPERIOPPHARMACY @   Your procedure is scheduled on 02/25/2016.  Report to [name] at 11:15 A.M.  Call this number if you have problems the morning of surgery:  [phone]   Remember:  Do not eat food or drink liquids after midnight.  Take these medicines the morning of surgery with A SIP OF WATER Klonopin, Catapress, Flexeril, Prozac, Gabapentin, Hydrocodone, Claritin  Nasonex, Seroquel   Do not wear jewelry, make-up or nail polish.  Do not wear lotions, powders, or perfumes.  You may wear deoderant.  Do not shave 48 hours prior to surgery.  Men may shave face and neck.  Do not bring valuables to the hospital.  [facility] is not responsible for any belongings or valuables.  Contacts, dentures or bridgework may not be worn into surgery.  Leave your suitcase in the car.  After surgery it may be brought to your room.  For patients admitted to the hospital, discharge time will be determined by your treatment team.  Patients discharged the day of surgery will not be allowed to drive home.    Please read over the following fact sheets that you were given. Anesthesia Post-op Instructions     PATIENT INSTRUCTIONS POST-ANESTHESIA  IMMEDIATELY FOLLOWING SURGERY:  Do not drive or operate machinery for the first twenty four hours after surgery.  Do not make any important decisions for twenty four hours after surgery or while taking narcotic pain medications or sedatives.  If you develop intractable nausea and vomiting or a severe headache please notify your doctor immediately.  FOLLOW-UP:  Please make an appointment with your surgeon as instructed. You do not need to follow up with anesthesia unless specifically instructed to do so.  WOUND CARE INSTRUCTIONS (if applicable):  Keep a dry clean dressing on the anesthesia/puncture wound site if there is drainage.  Once the wound has quit draining you may leave it open to air.  Generally you should  leave the bandage intact for twenty four hours unless there is drainage.  If the epidural site drains for more than 36-48 hours please call the anesthesia department.  QUESTIONS?:  Please feel free to call your physician or the hospital operator if you have any questions, and they will be happy to assist you.      Colonoscopy A colonoscopy is an exam to look at the entire large intestine (colon). This exam can help find problems such as tumors, polyps, inflammation, and areas of bleeding. The exam takes about 1 hour.  LET [facility] CARE PROVIDER KNOW ABOUT:   Any allergies you have.  All medicines you are taking, including vitamins, herbs, eye drops, creams, and over-the-counter medicines.  Previous problems you or members of your family have had with the use of anesthetics.  Any blood disorders you have.  Previous surgeries you have had.  Medical conditions you have. RISKS AND COMPLICATIONS  Generally, this is a safe procedure. However, as with any procedure, complications can occur. Possible complications include:  Bleeding.  Tearing or rupture of the colon wall.  Reaction to medicines given during the exam.  Infection (rare). BEFORE THE PROCEDURE   Ask your health care provider about changing or stopping your regular medicines.  You may be prescribed an oral bowel prep. This involves drinking a large amount of medicated liquid, starting the day before your procedure. The liquid will cause you to have multiple loose stools until your stool is almost clear or light  green. This cleans out your colon in preparation for the procedure.  Do not eat or drink anything else once you have started the bowel prep, unless your health care provider tells you it is safe to do so.  Arrange for someone to drive you home after the procedure. PROCEDURE   You will be given medicine to help you relax (sedative).  You will lie on your side with your knees bent.  A long, flexible tube with  a light and camera on the end (colonoscope) will be inserted through the rectum and into the colon. The camera sends video back to a computer screen as it moves through the colon. The colonoscope also releases carbon dioxide gas to inflate the colon. This helps your health care provider see the area better.  During the exam, your health care provider may take a small tissue sample (biopsy) to be examined under a microscope if any abnormalities are found.  The exam is finished when the entire colon has been viewed. AFTER THE PROCEDURE   Do not drive for 24 hours after the exam.  You may have a small amount of blood in your stool.  You may pass moderate amounts of gas and have mild abdominal cramping or bloating. This is caused by the gas used to inflate your colon during the exam.  Ask when your test results will be ready and how you will get your results. Make sure you get your test results.   This information is not intended to replace advice given to you by your health care provider. Make sure you discuss any questions you have with your health care provider.   Document Released: 07/24/2000 Document Revised: 05/17/2013 Document Reviewed: 04/03/2013 Elsevier Interactive Patient Education 2016 [place]. Esophagogastroduodenoscopy Esophagogastroduodenoscopy (EGD) is a procedure that is used to examine the lining of the esophagus, stomach, and first part of the small intestine (duodenum). A long, flexible, lighted tube with a camera attached (endoscope) is inserted down the throat to view these organs. This procedure is done to detect problems or abnormalities, such as inflammation, bleeding, ulcers, or growths, in order to treat them. The procedure lasts 5-20 minutes. It is usually an outpatient procedure, but it may need to be performed in a hospital in emergency cases. LET [facility] CARE PROVIDER KNOW ABOUT:  Any allergies you have.  All medicines you are taking, including vitamins,  herbs, eye drops, creams, and over-the-counter medicines.  Previous problems you or members of your family have had with the use of anesthetics.  Any blood disorders you have.  Previous surgeries you have had.  Medical conditions you have. RISKS AND COMPLICATIONS Generally, this is a safe procedure. However, problems can occur and include:  Infection.  Bleeding.  Tearing (perforation) of the esophagus, stomach, or duodenum.  Difficulty breathing or not being able to breathe.  Excessive sweating.  Spasms of the larynx.  Slowed heartbeat.  Low blood pressure. BEFORE THE PROCEDURE  Do not eat or drink anything after midnight on the night before the procedure or as directed by your health care provider.  Do not take your regular medicines before the procedure if your health care provider asks you not to. Ask your health care provider about changing or stopping those medicines.  If you wear dentures, be prepared to remove them before the procedure.  Arrange for someone to drive you home after the procedure. PROCEDURE  A numbing medicine (local anesthetic) may be sprayed in your throat for comfort and to stop  you from gagging or coughing.  You will have an IV tube inserted in a vein in your hand or arm. You will receive medicines and fluids through this tube.  You will be given a medicine to relax you (sedative).  A pain reliever will be given through the IV tube.  A mouth guard may be placed in your mouth to protect your teeth and to keep you from biting on the endoscope.  You will be asked to lie on your left side.  The endoscope will be inserted down your throat and into your esophagus, stomach, and duodenum.  Air will be put through the endoscope to allow your health care provider to clearly view the lining of your esophagus.  The lining of your esophagus, stomach, and duodenum will be examined. During the exam, your health care provider may:  Remove tissue to be  examined under a microscope (biopsy) for inflammation, infection, or other medical problems.  Remove growths.  Remove objects (foreign bodies) that are stuck.  Treat any bleeding with medicines or other devices that stop tissues from bleeding (hot cautery, clipping devices).  Widen (dilate) or stretch narrowed areas of your esophagus and stomach.  The endoscope will be withdrawn. AFTER THE PROCEDURE  You will be taken to a recovery area for observation. Your blood pressure, heart rate, breathing rate, and blood oxygen level will be monitored often until the medicines you were given have worn off.  Do not eat or drink anything until the numbing medicine has worn off and your gag reflex has returned. You may choke.  Your health care provider should be able to discuss his or her findings with you. It will take longer to discuss the test results if any biopsies were taken.   This information is not intended to replace advice given to you by your health care provider. Make sure you discuss any questions you have with your health care provider.   Document Released: 11/27/2004 Document Revised: 08/17/2014 Document Reviewed: 06/29/2012 Elsevier Interactive Patient Education [employer].

## 2016-02-19 ENCOUNTER — Telehealth: Payer: Self-pay | Admitting: Gastroenterology

## 2016-02-19 NOTE — Telephone Encounter (Signed)
noted 

## 2016-02-19 NOTE — Telephone Encounter (Signed)
Pt called to cancel his pre op and procedure. He is having problems with finances. He said that he would have to call us back to reschedule. I gave him the number to call to cancel his pre op. He is scheduled with SF on 02/25/16

## 2016-02-20 ENCOUNTER — Encounter (HOSPITAL_COMMUNITY)
Admission: RE | Admit: 2016-02-20 | Discharge: 2016-02-20 | Disposition: A | Discharge status: Home / Self Care | Payer: Medicare HMO | Source: Ambulatory Visit | Attending: Gastroenterology | Admitting: Gastroenterology

## 2016-02-25 ENCOUNTER — Ambulatory Visit (HOSPITAL_COMMUNITY): Admission: RE | Admit: 2016-02-25 | Payer: Medicare HMO | Source: Ambulatory Visit | Admitting: Gastroenterology

## 2016-02-25 ENCOUNTER — Encounter (HOSPITAL_COMMUNITY): Admission: RE | Payer: Self-pay | Source: Ambulatory Visit

## 2016-02-25 SURGERY — ESOPHAGOGASTRODUODENOSCOPY (EGD) WITH PROPOFOL
Anesthesia: Monitor Anesthesia Care

## 2016-02-25 NOTE — Telephone Encounter (Signed)
CANCELLED PREOP x2.

## 2016-02-26 ENCOUNTER — Other Ambulatory Visit: Payer: Self-pay | Admitting: Family Medicine

## 2016-02-28 ENCOUNTER — Telehealth: Payer: Self-pay | Admitting: Family Medicine

## 2016-02-28 DIAGNOSIS — M62830 Muscle spasm of back: Secondary | ICD-10-CM

## 2016-02-28 MED ORDER — CYCLOBENZAPRINE HCL 10 MG PO TABS: 10.0000 mg | ORAL_TABLET | Freq: Every day | ORAL | Status: DC

## 2016-02-28 NOTE — Telephone Encounter (Signed)
Patient is calling requesting a refill on cyclobenzaprine (FLEXERIL) 10 MG tablet  Called to [facility]

## 2016-02-28 NOTE — Telephone Encounter (Signed)
Med refill sent in.

## 2016-03-13 ENCOUNTER — Encounter (HOSPITAL_COMMUNITY): Payer: Self-pay | Admitting: Emergency Medicine

## 2016-03-13 ENCOUNTER — Telehealth (HOSPITAL_COMMUNITY): Payer: Self-pay | Admitting: *Deleted

## 2016-03-13 NOTE — Progress Notes (Signed)
Pt wanted to know about Harvoni, but we did not prescribe that for him, told him to call the prescribing doctor for that.

## 2016-03-16 ENCOUNTER — Telehealth: Payer: Self-pay

## 2016-03-16 NOTE — Telephone Encounter (Signed)
Noted  

## 2016-03-16 NOTE — Telephone Encounter (Signed)
Pt called and said he is almost finished with his 3rd bottle of Harvoni. Per Almyra Free, he only had 3 bottles to take and pt is aware. I told him to keep his follow up appt and he said he would.

## 2016-03-19 ENCOUNTER — Other Ambulatory Visit: Payer: Self-pay | Admitting: Family Medicine

## 2016-03-20 ENCOUNTER — Other Ambulatory Visit: Payer: Self-pay

## 2016-03-20 MED ORDER — CLONIDINE HCL 0.2 MG PO TABS: ORAL_TABLET | ORAL | 2 refills | Status: DC

## 2016-03-30 NOTE — Progress Notes (Signed)
[facility] NOTE  Name: Marc [name]      MRN: [id number]    Location: Room/bed info not found  Date: 03/31/2016 Time:7:05 PM   REFERRING PHYSICIAN:  Tula Nakayama, MD (Primary Care Provider)  REASON FOR CONSULT:  Leukopenia   DIAGNOSIS:  Leukopenia dating back to at least 2008 with infrequent episodes of thrombocytopenia (more most recently).  HISTORY OF PRESENT ILLNESS:   Marc [name] is a 60 year old white American with a past medical history significant for MDD, chronic low back pain with nerve stimulator in place, HTN, chronic Hep C, IBS, narcotic addiction who is referred to [facility] for leukopenia/thrombocytopenia.  Marc [name] returns to the [facility] today alone.  He has just recently completed Harvoni with last hepatitis C studies negative. He is relieved by this. He notes today that his pain physician is leaving their practice and he has been referred to another pain doctor. He is worried he will run out of his pain medication prior.   Since completion of his Harvoni he has days where he feels quite fatigued. He notes that after he finished the interferon it took him several months to feel like himself again.  He denies bleeding or bruising. No fever, chills.    Review of Systems  Constitutional: Positive for malaise/fatigue. Negative for fever, chills and diaphoresis.  HENT: Negative.  Negative for congestion, ear pain, hearing loss, nosebleeds, sore throat and tinnitus.   Eyes: Negative.  Negative for blurred vision, double vision, photophobia, pain and discharge.  Respiratory: Negative.  Negative for cough, hemoptysis, sputum production, shortness of breath, wheezing and stridor.   Cardiovascular: Negative.  Negative for chest pain, palpitations and leg swelling.  Gastrointestinal: Positive for constipation (secondary to pain medication). Negative for heartburn, nausea, vomiting, abdominal pain,  diarrhea, blood in stool and melena.  Genitourinary: Negative.  Negative for dysuria, urgency, frequency and hematuria.  Musculoskeletal: Positive for back pain, joint pain and falls. Negative for myalgias and neck pain.  Skin: Negative.  Negative for itching and rash.  Neurological: Positive for dizziness, loss of consciousness and weakness. Negative for tingling, tremors, sensory change, speech change, focal weakness, seizures and headaches.  Endo/Heme/Allergies: Negative.  Does not bruise/bleed easily.  Psychiatric/Behavioral: Negative.   14 point review of systems was performed and is negative except as detailed under history of present illness and above    PAST MEDICAL HISTORY:   Past Medical History:  Diagnosis Date  . Allergy   . Anxiety   . Arthritis   . Back problem   . Depression   . GERD (gastroesophageal reflux disease)   . Hepatitis C 1979  . Hepatitis C reactive    LIVER BX 2008-CHRONIC ACTIVE HEPATITIS  . HTN (hypertension)   . Hx of adenomatous colonic polyps 2008   due for surveillance 2017  . Hypercholesterolemia   . IBS (irritable bowel syndrome)   . Knee pain   . Leukopenia 11/06/2015  . Normal cardiac stress test 05/25/2011   [facility]  . Normal echocardiogram 05/25/2011   EF 55%, mild TR  . Substance abuse    narcotic addiction  . Thrombocytopenia ([place]) 11/06/2015  . Thyroiditis    History of    ALLERGIES: Allergies  Allergen Reactions  . Levofloxacin Nausea Only and Other (See Comments)    "Creepy" feeling, skin didn't feel right, sort of itchy.  Baker Pierini [Lubiprostone]  MADE HIM FEEL STIFF  . Ciprofloxacin     Other reaction(s): Sweating (intolerance)  . Sulfa Antibiotics       MEDICATIONS: I have reviewed the patient's current medications.    Current Outpatient Prescriptions on File Prior to Visit  Medication Sig Dispense Refill  . acetaminophen (TYLENOL) 500 MG tablet Take 500 mg by mouth as needed.    Marland Kitchen  azelastine (ASTELIN) 0.1 % nasal spray Place 2 sprays into both nostrils 2 (two) times daily. Use in each nostril as directed 30 mL 12  . clonazePAM (KLONOPIN) 0.5 MG tablet Take 1 tablet (0.5 mg total) by mouth 3 (three) times daily as needed for anxiety. 90 tablet 2  . cloNIDine (CATAPRES) 0.2 MG tablet TAKE (1) TABLET BY MOUTH TWICE DAILY. 60 tablet 2  . cyclobenzaprine (FLEXERIL) 10 MG tablet Take 1 tablet (10 mg total) by mouth at bedtime. 30 tablet 4  . FLUoxetine (PROZAC) 20 MG capsule Take 1 capsule (20 mg total) by mouth daily. 30 capsule 2  . gabapentin (NEURONTIN) 400 MG capsule Take 1 capsule (400 mg total) by mouth 3 (three) times daily. 90 capsule 2  . HYDROcodone-acetaminophen (NORCO) 7.5-325 MG per tablet Take 1 tablet by mouth 3 (three) times daily.    Marland Kitchen ibuprofen (ADVIL,MOTRIN) 200 MG tablet Take 400-600 mg by mouth every 6 (six) hours as needed. For pain    . Lactobacillus (DIGESTIVE HEALTH PROBIOTIC PO) Take 1 capsule by mouth daily.    . Ledipasvir-Sofosbuvir (HARVONI) 90-400 MG TABS Take 1 tablet by mouth daily. 90 tablet 0  . loratadine (CLARITIN) 10 MG tablet Take 10 mg by mouth daily.    . mometasone (NASONEX) 50 MCG/ACT nasal spray Place 2 sprays into the nose daily. 17 g 12  . polyethylene glycol-electrolytes (NULYTELY/GOLYTELY) 420 g solution Take 4,000 mLs by mouth once. 4000 mL 0  . QUEtiapine (SEROQUEL) 50 MG tablet Take by mouth 1 tablet 3 times daily and 2 at bedtime 150 tablet 2  . senna (SENOKOT) 8.6 MG TABS tablet Take 1 tablet by mouth daily as needed for mild constipation.     No current facility-administered medications on file prior to visit.      PAST SURGICAL HISTORY Past Surgical History:  Procedure Laterality Date  . APPENDECTOMY    . CARPAL TUNNEL RELEASE     rt  . COLONOSCOPY  2008 [facility] ARS D100 V8 PHEN 12.5   2 SIMPLE ADENOMAS (< 1 CM)  . ESOPHAGOGASTRODUODENOSCOPY  04/26/2012   Dr. Oneida Alar: Non-erosive gastritis (inflammation) was found in the  gastric antrum but no H.pylori; multiple biopsies (duodenal bx negative for Celiac)/ The mucosa of the esophagus appeared normal  . EUS  09/08/2012   Dr. Ardis Hughs: CBD dilated but no stones. Query secondary to Sphincter of Oddi stenosis, ?dysfunction, but clinically without symptoms  . HARDWARE REMOVAL Right 02/12/2014   Procedure: HARDWARE REMOVAL;  Surgeon: Jolyn Nap, MD;  Location: [facility];  Service: Orthopedics;  Laterality: Right;  . KNEE ARTHROSCOPY    . Neurostimulator implant    . right ring finger    . spinal stenosis, had screws put in neck  04/11/2009   DR KRITZER  . TONSILLECTOMY    . ULNA OSTEOTOMY Right 02/12/2014   Procedure: RIGHT ULNAR SHORTENING AND OSTEOTOMY ;  Surgeon: Jolyn Nap, MD;  Location: [place];  Service: Orthopedics;  Laterality: Right;  . ULNAR NERVE TRANSPOSITION    . UPPER GASTROINTESTINAL ENDOSCOPY  2008 [facility]  ABD PAIN WEIGHT LOSS d100 v8 phen 12.5   NL  . WRIST SURGERY Right jan 2015   Dr. Edmonia Lynch    FAMILY HISTORY: Family History  Problem Relation Age of Onset  . Cancer Mother     BLADDER  . Cancer Sister     METS  . Emotional abuse Sister   . Stroke Sister   . Emotional abuse Sister   . Heart failure Father   . Stroke Father   . Alcohol abuse Father   . Dementia Paternal Aunt   . Alcohol abuse Paternal Uncle   . Dementia Paternal Uncle   . Dementia Paternal Grandmother   . ADD / ADHD Cousin   . Bipolar disorder Cousin   . Anxiety disorder Cousin   . Depression Cousin     Committed suicide  . Alcohol abuse Cousin   . OCD Cousin   . Paranoid behavior Cousin   . Colon cancer Maternal Grandfather   . Heart defect      FAMILY HX  . Colon polyps Neg Hx   . Drug abuse Neg Hx   . Schizophrenia Neg Hx   . Seizures Neg Hx   . Sexual abuse Neg Hx   . Physical abuse Neg Hx    Mother is deceased at the age of 82 from ?urothelial cancer? Father is deceased at the age of 8 from CHF 1 sister is  deceased from "abdominal cancer" at the age of 67 (primary unknown to patient). 1 sister deceased from complications of a stroke at the age of 22; she was also mentally handicapped. 1 son who is 54 and healthy 1 grand-daughter who is 70 years old and healthy.   SOCIAL HISTORY: He admits to a smoking history of 2-3 pack ppd quitting 1987 after having smoked x 8 years = 24 pack years.  He also used to drink EtOH and admits to being a "weekend warrior."  He quit drinking in 2009.  He used to drink 200 mL of hard liquor with 2-4 16 oz cans of beer nightly on weekends ("sometimes more").  He admits to a history of marijuana; quitting in 2008.  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    PERFORMANCE STATUS: The patient's performance status is 1 - Symptomatic but completely ambulatory  PHYSICAL EXAM: Most Recent Vital Signs: Blood pressure 125/78, pulse 73, temperature 97.6 F (36.4 C), temperature source Oral, resp. rate 16, weight 175 lb 1.6 oz (79.4 kg), SpO2 100 %. General appearance: alert, cooperative, appears older than stated age, no distress Head: Normocephalic, without obvious abnormality, atraumatic Eyes: negative findings: lids and lashes normal, conjunctivae and sclerae normal, corneas clear and pupils equal, round, reactive to light and accomodation Ears: external ears are normal Throat: normal findings: lips normal without lesions, buccal mucosa normal, tongue midline and normal and oropharynx pink & moist without lesions or evidence of thrush Neck: no adenopathy, supple, symmetrical, trachea midline and thyroid not enlarged, symmetric, no tenderness/mass/nodules Lungs: clear to auscultation bilaterally and normal percussion bilaterally Heart: regular rate and rhythm, S1, S2 normal, no murmur, click, rub or gallop Abdomen: soft, non-tender; bowel sounds normal; no masses,  no organomegaly Extremities: extremities normal,  atraumatic, no cyanosis or edema Skin: Skin color, texture, turgor normal. No rashes or lesions Lymph nodes: Cervical, supraclavicular, and axillary nodes normal. Neurologic: Grossly normal  LABORATORY DATA:  I have reviewed the data as listed.  CBC  Component Value Date/Time   WBC 3.1 (L) 03/31/2016 1017   RBC 3.95 (L) 03/31/2016 1017   HGB 13.5 03/31/2016 1017   HCT 38.7 (L) 03/31/2016 1017   PLT 90 (L) 03/31/2016 1017   MCV 98.0 03/31/2016 1017   MCH 34.2 (H) 03/31/2016 1017   MCHC 34.9 03/31/2016 1017   RDW 12.4 03/31/2016 1017   LYMPHSABS 1.7 03/31/2016 1017   MONOABS 0.3 03/31/2016 1017   EOSABS 0.1 03/31/2016 1017   BASOSABS 0.0 03/31/2016 1017      Chemistry      Component Value Date/Time   NA 137 01/24/2016 0931   K 4.2 01/24/2016 0931   CL 103 01/24/2016 0931   CO2 24 01/24/2016 0931   BUN 18 01/24/2016 0931   CREATININE 1.12 01/24/2016 0931      Component Value Date/Time   CALCIUM 9.1 01/24/2016 0931   ALKPHOS 60 01/24/2016 0931   AST 61 (H) 01/24/2016 0931   ALT 62 (H) 01/24/2016 0931   BILITOT 0.5 01/24/2016 0931       RADIOLOGY: I have reviewed the images below and agree with the reported results  Study Result   CLINICAL DATA:  Chronic hepatitis-C.  EXAM: ULTRASOUND ABDOMEN COMPLETE  ULTRASOUND HEPATIC ELASTOGRAPHY  TECHNIQUE: Sonography of the upper abdomen was performed. In addition, ultrasound elastography evaluation of the liver was performed. A region of interest was placed within the right lobe of the liver. Following application of a compressive sonographic pulse, shear waves were detected in the adjacent hepatic tissue and the shear wave velocity was calculated. Multiple assessments were performed at the selected site. Median shear wave velocity is correlated to a Metavir fibrosis score.  COMPARISON:  Abdomen ultrasound on 11/14/2015  FINDINGS: ULTRASOUND ABDOMEN  Gallbladder: No gallstones or wall thickening  visualized. No sonographic Murphy sign noted by sonographer.  Common bile duct: Diameter: Suboptimally visualized, but measures approximately 6 mm in diameter.  Liver: Diffusely increased echogenicity of the hepatic parenchyma, consistent with hepatic steatosis/diffuse hepatocellular disease. No focal mass lesion identified. Portal vein is patent with normal hepatopetal flow direction.  IVC: Not well visualized due to decreased penetration related to hepatocellular disease.  Pancreas: Not well visualized due to overlying bowel gas.  Spleen: Size and appearance within normal limits.  Right Kidney: Length: 11.1 cm. Echogenicity within normal limits. No mass or hydronephrosis visualized.  Left Kidney: Length: 11.5 cm. Echogenicity within normal limits. No mass or hydronephrosis visualized.  Abdominal aorta: No aneurysm visualized, although abdominal aorta is partially obscured by overlying bowel gas.  Other findings: None.  ULTRASOUND HEPATIC ELASTOGRAPHY  Device: Siemens Helix VTQ  Patient position:  Left Lateral Decubitus  Transducer 4V1  Number of measurements:  10  Hepatic Segment:  8  Median velocity:   2.25  m/sec  IQR: 0.39  IQR/Median velocity ratio 0.17  Corresponding Metavir fibrosis score:  Some F3 + F4  Risk of fibrosis: High  Limitations of exam: Some liver motion due to limited breath-holding ability of patient.  Pertinent findings noted on other imaging exams:  None  Please note that abnormal shear wave velocities may also be identified in clinical settings other than with hepatic fibrosis, such as: acute hepatitis, elevated right heart and central venous pressures including use of beta blockers, veno-occlusive disease (Budd-Chiari), infiltrative processes such as mastocytosis/amyloidosis/infiltrative tumor, extrahepatic cholestasis, in the post-prandial state, and liver transplantation. Correlation with patient  history, laboratory data, and clinical condition recommended.  IMPRESSION: Diffuse hepatic steatosis/ hepatocellular disease. No other sonographic abnormality  identified.  Median hepatic shear wave velocity is calculated at 2.25 m/sec.  Corresponding Metavir fibrosis score is Some F3 + F4.  Risk of fibrosis is high.  Follow-up:  Followup Advised   Electronically Signed   By: Earle Gell M.D.   On: 01/17/2016 10:37    ASSESSMENT/PLAN:  Leukopenia Thrombocytopenia (HCC) Macrocytic Anemia Fatty Liver Hepatitis C Elevated Ferritin/Iron saturation  Currently I feel his laboratory abnormalities can be explained secondary to his liver disease ie. Fatty liver. He has completed Harvoni and last Hepatitis C RNA was negative. White count and platelets are stable, would continue with observation.  Will have him return in 6 months with repeat CBC and repeat iron studies and ferritin at that time. If ferritin and iron saturation remains elevated at that time will proceed with additional workup of his iron levels then.    Orders Placed This Encounter  Procedures  . CBC with Differential    Standing Status:   Future    Standing Expiration Date:   03/31/2017    All questions were answered. The patient knows to call the clinic with any problems, questions or concerns. We can certainly see the patient much sooner if necessary.  This document serves as a record of services personally performed by Ancil Linsey, MD. It was created on her behalf by Arlyce Harman, a trained medical scribe. The creation of this record is based on the scribe's personal observations and the provider's statements to them. This document has been checked and approved by the attending provider.  I have reviewed the above documentation for accuracy and completeness and I agree with the above.  This note is electronically signed by:  Molli Hazard, MD  03/31/2016 7:05 PM

## 2016-03-31 ENCOUNTER — Encounter (HOSPITAL_COMMUNITY): Payer: Medicare HMO

## 2016-03-31 ENCOUNTER — Encounter (HOSPITAL_COMMUNITY): Payer: Self-pay | Admitting: Hematology & Oncology

## 2016-03-31 ENCOUNTER — Encounter (HOSPITAL_COMMUNITY): Payer: Medicare HMO | Attending: Hematology & Oncology | Admitting: Hematology & Oncology

## 2016-03-31 VITALS — BP 125/78 | HR 73 | Temp 97.6°F | Resp 16 | Wt 175.1 lb

## 2016-03-31 DIAGNOSIS — D72819 Decreased white blood cell count, unspecified: Secondary | ICD-10-CM

## 2016-03-31 DIAGNOSIS — D539 Nutritional anemia, unspecified: Secondary | ICD-10-CM

## 2016-03-31 DIAGNOSIS — D696 Thrombocytopenia, unspecified: Secondary | ICD-10-CM | POA: Diagnosis present

## 2016-03-31 DIAGNOSIS — R7989 Other specified abnormal findings of blood chemistry: Secondary | ICD-10-CM

## 2016-03-31 LAB — CBC WITH DIFFERENTIAL/PLATELET
BASOS ABS: 0 10*3/uL (ref 0.0–0.1)
BASOS PCT: 0 %
EOS PCT: 2 %
Eosinophils Absolute: 0.1 10*3/uL (ref 0.0–0.7)
HEMATOCRIT: 38.7 % — AB (ref 39.0–52.0)
HEMOGLOBIN: 13.5 g/dL (ref 13.0–17.0)
LYMPHS ABS: 1.7 10*3/uL (ref 0.7–4.0)
LYMPHS PCT: 57 %
MCH: 34.2 pg — AB (ref 26.0–34.0)
MCHC: 34.9 g/dL (ref 30.0–36.0)
MCV: 98 fL (ref 78.0–100.0)
MONOS PCT: 9 %
Monocytes Absolute: 0.3 10*3/uL (ref 0.1–1.0)
NEUTROS ABS: 1 10*3/uL — AB (ref 1.7–7.7)
Neutrophils Relative %: 32 %
Platelets: 90 10*3/uL — ABNORMAL LOW (ref 150–400)
RBC: 3.95 MIL/uL — ABNORMAL LOW (ref 4.22–5.81)
RDW: 12.4 % (ref 11.5–15.5)
WBC: 3.1 10*3/uL — ABNORMAL LOW (ref 4.0–10.5)

## 2016-03-31 NOTE — Patient Instructions (Signed)
[facility] at [facility] Discharge Instructions  RECOMMENDATIONS MADE BY THE CONSULTANT AND ANY TEST RESULTS WILL BE SENT TO YOUR REFERRING PHYSICIAN.  You saw Dr. Whitney Muse today. Follow up in 6 months with Tom with lab work.  Thank you for choosing [place] at [facility] to provide your oncology and hematology care.  To afford each patient quality time with our provider, please arrive at least 15 minutes before your scheduled appointment time.   Beginning January 23rd 2017 lab work for the [facility] will be done in the  Main lab at [facility] on 1st floor. If you have a lab appointment with the [facility] please come in thru the  Main Entrance and check in at the main information desk  You need to re-schedule your appointment should you arrive 10 or more minutes late.  We strive to give you quality time with our providers, and arriving late affects you and other patients whose appointments are after yours.  Also, if you no show three or more times for appointments you may be dismissed from the clinic at the providers discretion.     Again, thank you for choosing [facility].  Our hope is that these requests will decrease the amount of time that you wait before being seen by our physicians.       _____________________________________________________________  Should you have questions after your visit to [facility], please contact our office at [phone] between the hours of 8:30 a.m. and 4:30 p.m.  Voicemails left after 4:30 p.m. will not be returned until the following business day.  For prescription refill requests, have your pharmacy contact our office.         Resources For Cancer Patients and their Caregivers ? American Cancer Society: Can assist with transportation, wigs, general needs, runs Look Good Feel Better.        (959)355-3944 ? Cancer Care: Provides financial assistance, online  support groups, medication/co-pay assistance.  1-800-813-HOPE 859 194 6024) ? [facility] Assists [place] cancer patients and their families through emotional , educational and financial support.  985 613 0724 ? [place] DSS Where to apply for food stamps, Medicaid and utility assistance. (351) 844-6411 ? [facility]: Transportation to medical appointments. 929-465-4185 ? Social Security Administration: May apply for disability if have a Stage IV cancer. 334-707-1991 952-283-6356 ? [employer], Disability and Transit Services: Assists with nutrition, care and transit needs. [facility] Support Programs: @10RELATIVEDAYS @ > Cancer Support Group  2nd Tuesday of the month 1pm-2pm, Journey Room  > Creative Journey  3rd Tuesday of the month 1130am-1pm, Journey Room  > Look Good Feel Better  1st Wednesday of the month 10am-12 noon, Journey Room (Call [facility] to register [phone])

## 2016-04-15 ENCOUNTER — Encounter (HOSPITAL_COMMUNITY): Payer: Self-pay | Admitting: Psychiatry

## 2016-04-15 ENCOUNTER — Ambulatory Visit (INDEPENDENT_AMBULATORY_CARE_PROVIDER_SITE_OTHER): Payer: Medicare HMO | Admitting: Psychiatry

## 2016-04-15 VITALS — BP 123/88 | HR 96 | Ht 70.0 in | Wt 171.6 lb

## 2016-04-15 DIAGNOSIS — F1920 Other psychoactive substance dependence, uncomplicated: Secondary | ICD-10-CM

## 2016-04-15 DIAGNOSIS — F331 Major depressive disorder, recurrent, moderate: Secondary | ICD-10-CM | POA: Diagnosis not present

## 2016-04-15 MED ORDER — FLUOXETINE HCL 20 MG PO CAPS: 20.0000 mg | ORAL_CAPSULE | Freq: Two times a day (BID) | ORAL | 2 refills | Status: DC

## 2016-04-15 MED ORDER — GABAPENTIN 400 MG PO CAPS: 400.0000 mg | ORAL_CAPSULE | Freq: Three times a day (TID) | ORAL | 2 refills | Status: DC

## 2016-04-15 MED ORDER — QUETIAPINE FUMARATE 50 MG PO TABS: ORAL_TABLET | ORAL | 2 refills | Status: DC

## 2016-04-15 MED ORDER — CLONAZEPAM 0.5 MG PO TABS: 0.5000 mg | ORAL_TABLET | Freq: Three times a day (TID) | ORAL | 2 refills | Status: DC | PRN

## 2016-04-15 NOTE — Progress Notes (Signed)
Patient ID: Marc [name], male   DOB: 11-18-1955, 60 y.o.   MRN: [id number] Patient ID: Marc [name], male   DOB: 1956/06/08, 60 y.o.   MRN: [id number] Patient ID: Marc [name], male   DOB: [birth year], 60 y.o.   MRN: [id number] Patient ID: Marc [name], male   DOB: Aug 07, 1956, 60 y.o.   MRN: [id number] Patient ID: Marc [name], male   DOB: 20-Jan-1956, 60 y.o.   MRN: [id number] Patient ID: Marc [name], male   DOB: [birth year], 60 y.o.   MRN: [id number] Patient ID: Marc [name], male   DOB: 18-Jul-1956, 60 y.o.   MRN: [id number] Patient ID: Marc [name], male   DOB: 09-May-1956, 60 y.o.   MRN: [id number] Patient ID: Marc [name], male   DOB: August 11, 1955, 60 y.o.   MRN: [id number] Patient ID: Marc [name], male   DOB: 12-07-1955, 60 y.o.   MRN: [id number] Patient ID: Marc [name], male   DOB: 1955/08/26, 60 y.o.   MRN: [id number] Patient ID: Marc [name], male   DOB: September 04, 1955, 60 y.o.   MRN: [id number] Patient ID: Marc [name], male   DOB: [birth year], 60 y.o.   MRN: [id number] Patient ID: Marc [name], male   DOB: 05-24-1956, 60 y.o.   MRN: [id number] [facility] [id number] Progress Note Marc [name] MRN: [id number] DOB: [birth year] Age: 60 y.o.  Date: 04/15/2016   Chief Complaint: Chief Complaint  Patient presents with  . Depression  . Anxiety  . 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 He has one son age 48 years old and lives in [place]  The patient states he has a history depression that started around 2009. Prior to that year he been treated with ribavirin to treat hepatitis C which made him very depressed. He's also had a number of deaths in his family-both sisters have died as well as both parents in the last several years. His son is very close to him and he only talks to him periodically.  Currently the patient is  struggling with medical problems including neck and back pain. He recently had a neurostimulator installed in his back which is helped to some degree. Lately he's been a bit more depressed because his aunt keeps calling and wanting to talk about all the dead relatives. He's not been seeing his counselor regularly and I think it would be helpful for him to get back into counseling. He does have friends and he walks his dog 2 miles a day. He admits to passive suicidal ideation but would never hurt himself.  The patient returns for followup after 3 months. He finished treatment for hepatitis C in July and is completed the [place]. He claims that he is not feeling well since then and is going through "withdrawal." He feels tired and achy with reduced appetite no energy and it "hurts to move." He somewhat more depressed because he feels foggy. I suggested we increase his Prozac and he agrees. He states that this is slowly getting better and is just going to take time. He also states that his pain management doctor has left the practice and he's not scheduled with another one until September 26 and these worries going to run out of his pain medicine. I explained that we don't prescribe this year and he will have  to discuss it with the next pain management specialist.   Past psychiatric history Patient has history of depression for many years. He has taken in the past Wellbutrin Cymbalta and Celexa. He denies any history of suicidal attempt.  Alcohol and substance use history Patient has significant history of alcohol with multiple rehabilitation and detox treatment. He has history of DWI. He also had history of taking recreational drugs and he was in the [employer]. He has history of intravenous drug use that cause hepatitis C. Patient also had history of using pain medication.  Allergies: Allergies  Allergen Reactions  . Levofloxacin Nausea Only and Other (See Comments)    "Creepy" feeling, skin didn't feel  right, sort of itchy.  Baker Pierini [Lubiprostone]     MADE HIM FEEL STIFF  . Ciprofloxacin     Other reaction(s): Sweating (intolerance)  . Sulfa Antibiotics    Medical History: Past Medical History:  Diagnosis Date  . Allergy   . Anxiety   . Arthritis   . Back problem   . Depression   . GERD (gastroesophageal reflux disease)   . Hepatitis C 1979  . Hepatitis C reactive    LIVER BX 2008-CHRONIC ACTIVE HEPATITIS  . HTN (hypertension)   . Hx of adenomatous colonic polyps 2008   due for surveillance 2017  . Hypercholesterolemia   . IBS (irritable bowel syndrome)   . Knee pain   . Leukopenia 11/06/2015  . Normal cardiac stress test 05/25/2011   [facility] [place]  . Normal echocardiogram 05/25/2011   EF 55%, mild TR  . Substance abuse    narcotic addiction  . Thrombocytopenia ([place]) 11/06/2015  . Thyroiditis    History of  Cervical fusion Neck surgery Neck pain Hepatitis C Hypertension Benign tremors High blood pressure He sees Dr. Moshe Cipro and Dr. Paulita Cradle for pain management.  Surgical History: Past Surgical History:  Procedure Laterality Date  . APPENDECTOMY    . CARPAL TUNNEL RELEASE     rt  . COLONOSCOPY  2008 SLF ARS D100 V8 PHEN 12.5   2 SIMPLE ADENOMAS (< 1 CM)  . ESOPHAGOGASTRODUODENOSCOPY  04/26/2012   Dr. Oneida Alar: Non-erosive gastritis (inflammation) was found in the gastric antrum but no H.pylori; multiple biopsies (duodenal bx negative for Celiac)/ The mucosa of the esophagus appeared normal  . EUS  09/08/2012   Dr. Ardis Hughs: CBD dilated but no stones. Query secondary to Sphincter of Oddi stenosis, ?dysfunction, but clinically without symptoms  . HARDWARE REMOVAL Right 02/12/2014   Procedure: HARDWARE REMOVAL;  Surgeon: Jolyn Nap, MD;  Location: [place];  Service: Orthopedics;  Laterality: Right;  . KNEE ARTHROSCOPY    . Neurostimulator implant    . right ring finger    . spinal stenosis, had screws put in neck  04/11/2009   DR  KRITZER  . TONSILLECTOMY    . ULNA OSTEOTOMY Right 02/12/2014   Procedure: RIGHT ULNAR SHORTENING AND OSTEOTOMY ;  Surgeon: Jolyn Nap, MD;  Location: [place];  Service: Orthopedics;  Laterality: Right;  . ULNAR NERVE TRANSPOSITION    . UPPER GASTROINTESTINAL ENDOSCOPY  2008 SLF ABD PAIN WEIGHT LOSS d100 v8 phen 12.5   NL  . WRIST SURGERY Right jan 2015   Dr. Edmonia Lynch   Family History: family history includes ADD / ADHD in his cousin; Alcohol abuse in his cousin, father, and paternal uncle; Anxiety disorder in his cousin; Bipolar disorder in his cousin; Cancer in his mother and sister;  Colon cancer in his maternal grandfather; Dementia in his paternal aunt, paternal grandmother, and paternal uncle; Depression in his cousin; Emotional abuse in his sister and sister; Heart failure in his father; OCD in his cousin; Paranoid behavior in his cousin; Stroke in his father and sister. Reviewed again in the office and no changes were noted except the new pain issues  Psychosocial history Patient was born and raised in [place].  He married twice. 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 Patient has multiple family member who has died in past few years. His father died in 11/25/2007 and mother died in 11-24-08  and 2 of his sister also died.  Patient lives by himself.  Mental status examination Patient is casually dressed and groomed.Marland KitchenHe maintained fair eye contact. He described his mood as somewhat depressed and tired and his affect is congruent His speech is slow but clear. His thought process is slow but logical linear and goal-directed. Marland Kitchen  He denies any active or passive suicidal thinking or homicidal thinking. He denies any auditory or visual hallucination. He is alert and oriented x3.  His attention and concentration is distracted. There were no paranoia or delusions present at this time. His insight judgment and impulse control is  okay. Review of systems is positive for chronic back pain and pain in his left hand Diagnosis Axis I Maj. depressive disorder, polysubstance dependence by history Axis II deferred Axis III see medical history Axis IV mild to moderate Axis V 65-70  Plan/Discussion: I took his vitals.  I reviewed CC, tobacco/med/surg Hx, meds effects/ side effects, problem list, therapies and responses as well as current situation/symptoms discussed options. He will continue clonazepam for anxiety,  Seroquel for mood stabilization, Neurontin for anxiety. He'll continue  Prozac and increase the dose to 20 mg twice a day. He'll reinstate his counseling here with [facility]  and return in 3 months or call if needed sooner  See orders and pt instructions for more details.  Medical Decision Making Problem Points:  Established problem, stable/improving (1), Review of last therapy session (1) and Review of psycho-social stressors (1) Data Points:  Review or order clinical lab tests (1) Review of medication regiment & side effects (2)  I certify that outpatient services furnished can reasonably be expected to improve the patient's condition.   [name], MDPatient ID: Morene Antu, male   DOB: 1955-11-16, 60 y.o.   MRN: [id number]

## 2016-04-16 ENCOUNTER — Telehealth: Payer: Self-pay | Admitting: *Deleted

## 2016-04-27 ENCOUNTER — Ambulatory Visit: Payer: Self-pay | Admitting: Gastroenterology

## 2016-05-05 ENCOUNTER — Ambulatory Visit: Payer: Self-pay | Admitting: Pain Medicine

## 2016-05-06 ENCOUNTER — Ambulatory Visit: Payer: Self-pay | Admitting: Gastroenterology

## 2016-05-12 ENCOUNTER — Ambulatory Visit (HOSPITAL_COMMUNITY): Payer: Self-pay | Admitting: Psychiatry

## 2016-06-03 ENCOUNTER — Ambulatory Visit: Payer: Self-pay | Admitting: Gastroenterology

## 2016-06-04 ENCOUNTER — Ambulatory Visit (INDEPENDENT_AMBULATORY_CARE_PROVIDER_SITE_OTHER): Payer: Medicare HMO | Admitting: Gastroenterology

## 2016-06-04 ENCOUNTER — Other Ambulatory Visit: Payer: Self-pay

## 2016-06-04 ENCOUNTER — Encounter: Payer: Self-pay | Admitting: Gastroenterology

## 2016-06-04 VITALS — BP 153/108 | HR 88 | Temp 97.6°F | Ht 70.0 in | Wt 181.2 lb

## 2016-06-04 DIAGNOSIS — K5903 Drug induced constipation: Secondary | ICD-10-CM | POA: Diagnosis not present

## 2016-06-04 DIAGNOSIS — Z86010 Personal history of colonic polyps: Secondary | ICD-10-CM

## 2016-06-04 DIAGNOSIS — T402X5A Adverse effect of other opioids, initial encounter: Secondary | ICD-10-CM

## 2016-06-04 DIAGNOSIS — B182 Chronic viral hepatitis C: Secondary | ICD-10-CM | POA: Diagnosis not present

## 2016-06-04 NOTE — Patient Instructions (Signed)
We have scheduled you for a colonoscopy and upper endoscopy with Dr. Oneida Alar.  For constipation: try Amitiza 24 mcg one gelcap twice a day with food. You can decrease this to once a day if needed.   Please have blood work done today.   We will do an ultrasound of your liver in December 2017.

## 2016-06-04 NOTE — Progress Notes (Signed)
Referring Provider: Fayrene Helper, MD Primary Care Physician:  Tula Nakayama, MD  Primary GI: Dr. Oneida Alar   Chief Complaint  Patient presents with  . Follow-up    ? f/u after taking Harvoni, no problems  . Constipation    Senna doesn't help    HPI:   Marc [name] is a 60 y.o. male presenting today with a history of chronic Hep C genotype 1a with elastography showing F3/F4, GERD, IBS, history of adenomas, due for surveillance colonoscopy now. He is also due for EGD for screening purposes and assess for any changes of portal hypertension due to F3/F4 score. Chronic thrombocytopenia and leukopenia followed by hematology, with ultrasound showing normal spleen.   Constipation: Movantik too expensive. Linzess 290 mcg was too "messy".   Hep C: HCV RNA at 4 weeks was undetectable. He needs a repeat HCV RNA now.   No abdominal pain, N/V, dysphagia, overt GI bleeding.    Past Medical History:  Diagnosis Date  . Allergy   . Anxiety   . Arthritis   . Back problem   . Depression   . GERD (gastroesophageal reflux disease)   . Hepatitis C 1979  . Hepatitis C reactive    LIVER BX 2008-CHRONIC ACTIVE HEPATITIS  . HTN (hypertension)   . Hx of adenomatous colonic polyps 2008   due for surveillance 2017  . Hypercholesterolemia   . IBS (irritable bowel syndrome)   . Knee pain   . Leukopenia 11/06/2015  . Normal cardiac stress test 05/25/2011   [facility] [place]  . Normal echocardiogram 05/25/2011   EF 55%, mild TR  . Substance abuse    narcotic addiction  . Thrombocytopenia ([place]) 11/06/2015  . Thyroiditis    History of    Past Surgical History:  Procedure Laterality Date  . APPENDECTOMY    . CARPAL TUNNEL RELEASE     rt  . COLONOSCOPY  2008 SLF ARS D100 V8 PHEN 12.5   2 SIMPLE ADENOMAS (< 1 CM)  . ESOPHAGOGASTRODUODENOSCOPY  04/26/2012   Dr. Oneida Alar: Non-erosive gastritis (inflammation) was found in the gastric antrum but no H.pylori; multiple biopsies  (duodenal bx negative for Celiac)/ The mucosa of the esophagus appeared normal  . EUS  09/08/2012   Dr. Ardis Hughs: CBD dilated but no stones. Query secondary to Sphincter of Oddi stenosis, ?dysfunction, but clinically without symptoms  . HARDWARE REMOVAL Right 02/12/2014   Procedure: HARDWARE REMOVAL;  Surgeon: Jolyn Nap, MD;  Location: [place];  Service: Orthopedics;  Laterality: Right;  . KNEE ARTHROSCOPY    . Neurostimulator implant    . right ring finger    . spinal stenosis, had screws put in neck  04/11/2009   DR KRITZER  . TONSILLECTOMY    . ULNA OSTEOTOMY Right 02/12/2014   Procedure: RIGHT ULNAR SHORTENING AND OSTEOTOMY ;  Surgeon: Jolyn Nap, MD;  Location: [place];  Service: Orthopedics;  Laterality: Right;  . ULNAR NERVE TRANSPOSITION    . UPPER GASTROINTESTINAL ENDOSCOPY  2008 SLF ABD PAIN WEIGHT LOSS d100 v8 phen 12.5   NL  . WRIST SURGERY Right jan 2015   Dr. Edmonia Lynch    Current Outpatient Prescriptions  Medication Sig Dispense Refill  . acetaminophen (TYLENOL) 500 MG tablet Take 500 mg by mouth as needed.    Marland Kitchen azelastine (ASTELIN) 0.1 % nasal spray Place 2 sprays into both nostrils 2 (two) times daily. Use in each nostril as directed (Patient taking  differently: Place 2 sprays into both nostrils as needed. Use in each nostril as directed) 30 mL 12  . Buprenorphine HCl (BELBUCA) 75 MCG FILM Place 1 Film inside cheek 2 (two) times daily. For pain    . clonazePAM (KLONOPIN) 0.5 MG tablet Take 1 tablet (0.5 mg total) by mouth 3 (three) times daily as needed for anxiety. 90 tablet 2  . cloNIDine (CATAPRES) 0.2 MG tablet TAKE (1) TABLET BY MOUTH TWICE DAILY. 60 tablet 2  . cloNIDine (CATAPRES) 0.2 MG tablet TAKE (1) TABLET BY MOUTH TWICE DAILY.    . cyclobenzaprine (FLEXERIL) 10 MG tablet Take 1 tablet (10 mg total) by mouth at bedtime. 30 tablet 4  . FLUoxetine (PROZAC) 20 MG capsule Take 1 capsule (20 mg total) by mouth 2 (two)  times daily. 60 capsule 2  . gabapentin (NEURONTIN) 400 MG capsule Take 1 capsule (400 mg total) by mouth 3 (three) times daily. 90 capsule 2  . ibuprofen (ADVIL,MOTRIN) 200 MG tablet Take 400-600 mg by mouth every 6 (six) hours as needed. For pain    . Lactobacillus (DIGESTIVE HEALTH PROBIOTIC PO) Take 1 capsule by mouth as needed.     Marland Kitchen omeprazole (PRILOSEC) 20 MG capsule Take 20 mg by mouth daily.    . QUEtiapine (SEROQUEL) 50 MG tablet Take by mouth 1 tablet 3 times daily and 2 at bedtime 150 tablet 2  . senna (SENOKOT) 8.6 MG TABS tablet Take 1 tablet by mouth daily as needed for mild constipation.     No current facility-administered medications for this visit.     Allergies as of 06/04/2016 - Review Complete 06/04/2016  Allergen Reaction Noted  . Levofloxacin Nausea Only and Other (See Comments)   . Amitiza [lubiprostone]  07/22/2012  . Ciprofloxacin  06/13/2015  . Sulfa antibiotics  12/02/2012    Family History  Problem Relation Age of Onset  . Cancer Mother     BLADDER  . Cancer Sister     METS  . Emotional abuse Sister   . Stroke Sister   . Emotional abuse Sister   . Heart failure Father   . Stroke Father   . Alcohol abuse Father   . Dementia Paternal Aunt   . Alcohol abuse Paternal Uncle   . Dementia Paternal Uncle   . Dementia Paternal Grandmother   . ADD / ADHD Cousin   . Bipolar disorder Cousin   . Anxiety disorder Cousin   . Depression Cousin     Committed suicide  . Alcohol abuse Cousin   . OCD Cousin   . Paranoid behavior Cousin   . Colon cancer Maternal Grandfather   . Heart defect      FAMILY HX  . Colon polyps Neg Hx   . Drug abuse Neg Hx   . Schizophrenia Neg Hx   . Seizures Neg Hx   . Sexual abuse Neg Hx   . Physical abuse Neg Hx     Social History   Social History  . Marital status: Divorced    Spouse name: N/A  . Number of children: N/A  . Years of education: N/A   Social History Main Topics  . Smoking status: Former Smoker     Packs/day: 1.50    Years: 8.00    Types: Cigarettes    Quit date: 08/13/1986  . Smokeless tobacco: Never Used     Comment: quit 20 + yrs ago  . Alcohol use No  . Drug use: No  . Sexual activity:  No   Other Topics Concern  . None   Social History Narrative  . None    Review of Systems: As mentioned in HPI   Physical Exam: BP (!) 153/108   Pulse 88   Temp 97.6 F (36.4 C) (Oral)   Ht 5\' 10"  (1.778 m)   Wt 181 lb 3.2 oz (82.2 kg)   BMI 26.00 kg/m  General:   Alert and oriented. No distress noted. Pleasant and cooperative.  Head:  Normocephalic and atraumatic. Eyes:  Conjuctiva clear without scleral icterus. Mouth:  Oral mucosa pink and moist. Good dentition. No lesions. Neck:  Supple, without mass or thyromegaly. Heart:  S1, S2 present without murmurs, rubs, or gallops.  Abdomen:  +BS, soft, non-tender and non-distended. No rebound or guarding.  Msk:  Symmetrical without gross deformities. Normal posture. Extremities:  Without edema. Neurologic:  Alert and  oriented x4;  grossly normal neurologically. Psych:  Alert and cooperative. Normal mood and affect.

## 2016-06-04 NOTE — Assessment & Plan Note (Addendum)
Genotype 1a, elastography F3/F4, completed course of Harvoni with last HCV RNA at 4 weeks into treatment undetectable. Needs HCV RNA now as he is approximately 3 months post-therapy. May check again in 3 months as well. Chronic thrombocytopenia and leukopenia followed by hematology, with ultrasound showing normal spleen. Needs EGD to be scheduled at time of colonoscopy for screening purposes and assess for any changes of portal hypertension.    Check HCV RNA now and then again 3 months from now to ensure SVR.  Proceed with upper endoscopy in the near future with Dr. Oneida Alar. The risks, benefits, and alternatives have been discussed in detail with patient. They have stated understanding and desire to proceed.  PROPOFOL due to polypharmacy US abdomen Dec 2017

## 2016-06-05 LAB — COMPLETE METABOLIC PANEL WITH GFR
ALK PHOS: 67 U/L (ref 40–115)
ALT: 57 U/L — ABNORMAL HIGH (ref 9–46)
AST: 56 U/L — ABNORMAL HIGH (ref 10–35)
Albumin: 4.1 g/dL (ref 3.6–5.1)
BUN: 15 mg/dL (ref 7–25)
CO2: 24 mmol/L (ref 20–31)
Calcium: 9.5 mg/dL (ref 8.6–10.3)
Chloride: 104 mmol/L (ref 98–110)
Creat: 1.05 mg/dL (ref 0.70–1.25)
GFR, EST AFRICAN AMERICAN: 88 mL/min (ref >=60)
GFR, EST NON AFRICAN AMERICAN: 76 mL/min (ref >=60)
GLUCOSE: 134 mg/dL — AB (ref 65–99)
POTASSIUM: 4.1 mmol/L (ref 3.5–5.3)
SODIUM: 138 mmol/L (ref 135–146)
Total Bilirubin: 0.6 mg/dL (ref 0.2–1.2)
Total Protein: 7.1 g/dL (ref 6.1–8.1)

## 2016-06-06 LAB — PROTIME-INR
INR: 1
Prothrombin Time: 11 s (ref 9.0–11.5)

## 2016-06-07 NOTE — Assessment & Plan Note (Signed)
Movantik too expensive, Linzess causing diarrhea. Trial Amitiza 24 mcg with food.

## 2016-06-07 NOTE — Assessment & Plan Note (Signed)
60 year old male with history of adenomas in 2008, due for surveillance now. No concerning lower GI symptoms. Chronic constipation at baseline and secondary to chronic opioid use.   Proceed with colonoscopy with Dr. Oneida Alar in the near future. The risks, benefits, and alternatives have been discussed in detail with the patient. They state understanding and desire to proceed.  PROPOFOL due to polypharmacy

## 2016-06-08 LAB — HEPATITIS C RNA QUANTITATIVE: HCV Quantitative: NOT DETECTED IU/mL (ref <15)

## 2016-06-08 NOTE — Progress Notes (Signed)
CC'D TO PCP °

## 2016-06-11 ENCOUNTER — Telehealth: Payer: Self-pay | Admitting: Gastroenterology

## 2016-06-11 NOTE — Telephone Encounter (Signed)
Patient wanted you to know that he was fasting the day he had his labs drawn

## 2016-06-12 ENCOUNTER — Encounter: Payer: Self-pay | Admitting: Gastroenterology

## 2016-06-12 ENCOUNTER — Other Ambulatory Visit: Payer: Self-pay | Admitting: Family Medicine

## 2016-06-15 ENCOUNTER — Telehealth: Payer: Self-pay | Admitting: Gastroenterology

## 2016-06-15 NOTE — Progress Notes (Signed)
MELD 9. Achieved SVR. Patient aware.

## 2016-06-15 NOTE — Telephone Encounter (Signed)
Is patient going to be scheduled for TCS/EGD with Dr. Oneida Alar with Propofol, or is he waiting? I saw him Oct 26th and that was the plan, but he could have cancelled and I not know about it. Just making sure.

## 2016-06-15 NOTE — Telephone Encounter (Signed)
He stated that he would call us to schedule but he has not called yet

## 2016-06-25 ENCOUNTER — Ambulatory Visit: Payer: Self-pay

## 2016-06-29 NOTE — Telephone Encounter (Signed)
Letter mailed

## 2016-06-29 NOTE — Telephone Encounter (Signed)
Let's send a letter. Will likely need another office visit to arrange.

## 2016-07-01 ENCOUNTER — Ambulatory Visit: Payer: Self-pay

## 2016-07-01 ENCOUNTER — Other Ambulatory Visit (HOSPITAL_COMMUNITY): Payer: Self-pay | Admitting: Psychiatry

## 2016-07-14 ENCOUNTER — Ambulatory Visit: Payer: Self-pay

## 2016-07-14 ENCOUNTER — Other Ambulatory Visit: Payer: Self-pay

## 2016-07-14 ENCOUNTER — Telehealth: Payer: Self-pay | Admitting: Family Medicine

## 2016-07-14 ENCOUNTER — Other Ambulatory Visit: Payer: Self-pay | Admitting: Family Medicine

## 2016-07-14 DIAGNOSIS — M62830 Muscle spasm of back: Secondary | ICD-10-CM

## 2016-07-14 MED ORDER — CYCLOBENZAPRINE HCL 10 MG PO TABS: 10.0000 mg | ORAL_TABLET | Freq: Every day | ORAL | 4 refills | Status: DC

## 2016-07-14 NOTE — Telephone Encounter (Signed)
[place] Visit today was r/s to Thursday and he is calling stating that he is completely out of cyclobenzaprine (FLEXERIL) 10 MG tablet and is asking if we can go ahead and refill that, please advise?

## 2016-07-14 NOTE — Telephone Encounter (Signed)
Med refilled.

## 2016-07-15 ENCOUNTER — Ambulatory Visit (INDEPENDENT_AMBULATORY_CARE_PROVIDER_SITE_OTHER): Payer: Medicare HMO | Admitting: Psychiatry

## 2016-07-15 ENCOUNTER — Encounter (HOSPITAL_COMMUNITY): Payer: Self-pay | Admitting: Psychiatry

## 2016-07-15 VITALS — BP 146/100 | HR 92 | Ht 70.0 in | Wt 174.0 lb

## 2016-07-15 DIAGNOSIS — F331 Major depressive disorder, recurrent, moderate: Secondary | ICD-10-CM

## 2016-07-15 DIAGNOSIS — Z882 Allergy status to sulfonamides status: Secondary | ICD-10-CM | POA: Diagnosis not present

## 2016-07-15 DIAGNOSIS — Z888 Allergy status to other drugs, medicaments and biological substances status: Secondary | ICD-10-CM

## 2016-07-15 DIAGNOSIS — Z79899 Other long term (current) drug therapy: Secondary | ICD-10-CM

## 2016-07-15 MED ORDER — FLUOXETINE HCL 20 MG PO CAPS: 20.0000 mg | ORAL_CAPSULE | Freq: Two times a day (BID) | ORAL | 2 refills | Status: DC

## 2016-07-15 MED ORDER — QUETIAPINE FUMARATE 50 MG PO TABS: ORAL_TABLET | ORAL | 2 refills | Status: DC

## 2016-07-15 MED ORDER — CLONAZEPAM 0.5 MG PO TABS: 0.5000 mg | ORAL_TABLET | Freq: Three times a day (TID) | ORAL | 2 refills | Status: DC | PRN

## 2016-07-15 MED ORDER — GABAPENTIN 400 MG PO CAPS: 400.0000 mg | ORAL_CAPSULE | Freq: Three times a day (TID) | ORAL | 2 refills | Status: DC

## 2016-07-15 NOTE — Progress Notes (Signed)
Patient ID: Marc [name], male   DOB: 11/27/1955, 60 y.o.   MRN: [id number] Patient ID: Marc [name], male   DOB: 1955-12-07, 60 y.o.   MRN: [id number] Patient ID: Marc [name], male   DOB: 23-Feb-1956, 60 y.o.   MRN: [id number] Patient ID: Marc [name], male   DOB: 01/28/1956, 60 y.o.   MRN: [id number] Patient ID: Marc [name], male   DOB: 10-Jul-1956, 60 y.o.   MRN: [id number] Patient ID: Marc [name], male   DOB: [birth year], 60 y.o.   MRN: [id number] Patient ID: Marc [name], male   DOB: 1956/07/11, 60 y.o.   MRN: [id number] Patient ID: Marc [name], male   DOB: 03-04-1956, 60 y.o.   MRN: [id number] Patient ID: Marc [name], male   DOB: [birth year], 60 y.o.   MRN: [id number] Patient ID: Marc [name], male   DOB: [birth year], 60 y.o.   MRN: [id number] Patient ID: Marc [name], male   DOB: 21-May-1956, 60 y.o.   MRN: [id number] Patient ID: Marc [name], male   DOB: 08-15-1955, 60 y.o.   MRN: [id number] Patient ID: Marc [name], male   DOB: [birth year], 60 y.o.   MRN: [id number] Patient ID: Marc [name], male   DOB: 1955-10-06, 60 y.o.   MRN: [id number] [facility] [id number] Progress Note Marc [name] MRN: [id number] DOB: Nov 02, 1955 Age: 60 y.o.  Date: 07/15/2016   Chief Complaint: Chief Complaint  Patient presents with  . Depression  . Anxiety  . 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 He has one son age 35 years old and lives in [place]  The patient states he has a history depression that started around 2009. Prior to that year he been treated with ribavirin to treat hepatitis C which made him very depressed. He's also had a number of deaths in his family-both sisters have died as well as both parents in the last several years. His son is very close to him and he only talks to him periodically.  Currently the patient is struggling with  medical problems including neck and back pain. He recently had a neurostimulator installed in his back which is helped to some degree. Lately he's been a bit more depressed because his aunt keeps calling and wanting to talk about all the dead relatives. He's not been seeing his counselor regularly and I think it would be helpful for him to get back into counseling. He does have friends and he walks his dog 2 miles a day. He admits to passive suicidal ideation but would never hurt himself.  The patient returns for followup after 3 months. He still a little better than last time but still has a lot of chronic pain. He is glad that he is finally off the harvoni and his hepatitisC is gone now. His pain management physician has started him on [place] and he doesn't like it and is increasing his blood pressure. He is trying to connect with some people from his past job most the time he stays to himself and spends time with his dog and goes on several walks a day. He does think the medications are still helpful for his anxiety and depression.  Past psychiatric history Patient has history of depression for many years. He has taken in the past Wellbutrin Cymbalta and Celexa. He denies  any history of suicidal attempt.  Alcohol and substance use history Patient has significant history of alcohol with multiple rehabilitation and detox treatment. He has history of DWI. He also had history of taking recreational drugs and he was in the [employer]. He has history of intravenous drug use that cause hepatitis C. Patient also had history of using pain medication.  Allergies: Allergies  Allergen Reactions  . Levofloxacin Nausea Only and Other (See Comments)    "Creepy" feeling, skin didn't feel right, sort of itchy.  Baker Pierini [Lubiprostone]     MADE HIM FEEL STIFF  . Ciprofloxacin     Other reaction(s): Sweating (intolerance)  . Sulfa Antibiotics    Medical History: Past Medical History:  Diagnosis Date  . Allergy    . Anxiety   . Arthritis   . Back problem   . Depression   . GERD (gastroesophageal reflux disease)   . Hepatitis C 1979  . Hepatitis C reactive    LIVER BX 2008-CHRONIC ACTIVE HEPATITIS  . HTN (hypertension)   . Hx of adenomatous colonic polyps 2008   due for surveillance 2017  . Hypercholesterolemia   . IBS (irritable bowel syndrome)   . Knee pain   . Leukopenia 11/06/2015  . Normal cardiac stress test 05/25/2011   [facility] [place]  . Normal echocardiogram 05/25/2011   EF 55%, mild TR  . Substance abuse    narcotic addiction  . Thrombocytopenia ([place]) 11/06/2015  . Thyroiditis    History of  Cervical fusion Neck surgery Neck pain Hepatitis C Hypertension Benign tremors High blood pressure He sees Dr. Moshe Cipro and Dr. Paulita Cradle for pain management.  Surgical History: Past Surgical History:  Procedure Laterality Date  . APPENDECTOMY    . CARPAL TUNNEL RELEASE     rt  . COLONOSCOPY  2008 SLF ARS D100 V8 PHEN 12.5   2 SIMPLE ADENOMAS (< 1 CM)  . ESOPHAGOGASTRODUODENOSCOPY  04/26/2012   Dr. Oneida Alar: Non-erosive gastritis (inflammation) was found in the gastric antrum but no H.pylori; multiple biopsies (duodenal bx negative for Celiac)/ The mucosa of the esophagus appeared normal  . EUS  09/08/2012   Dr. Ardis Hughs: CBD dilated but no stones. Query secondary to Sphincter of Oddi stenosis, ?dysfunction, but clinically without symptoms  . HARDWARE REMOVAL Right 02/12/2014   Procedure: HARDWARE REMOVAL;  Surgeon: Jolyn Nap, MD;  Location: [facility];  Service: Orthopedics;  Laterality: Right;  . KNEE ARTHROSCOPY    . Neurostimulator implant    . right ring finger    . spinal stenosis, had screws put in neck  04/11/2009   DR KRITZER  . TONSILLECTOMY    . ULNA OSTEOTOMY Right 02/12/2014   Procedure: RIGHT ULNAR SHORTENING AND OSTEOTOMY ;  Surgeon: Jolyn Nap, MD;  Location: [facility];  Service: Orthopedics;  Laterality: Right;  .  ULNAR NERVE TRANSPOSITION    . UPPER GASTROINTESTINAL ENDOSCOPY  2008 SLF ABD PAIN WEIGHT LOSS d100 v8 phen 12.5   NL  . WRIST SURGERY Right jan 2015   Dr. Edmonia Lynch   Family History: family history includes ADD / ADHD in his cousin; Alcohol abuse in his cousin, father, and paternal uncle; Anxiety disorder in his cousin; Bipolar disorder in his cousin; Cancer in his mother and sister; Colon cancer in his maternal grandfather; Dementia in his paternal aunt, paternal grandmother, and paternal uncle; Depression in his cousin; Emotional abuse in his sister and sister; Heart failure in his father; OCD in  his cousin; Paranoid behavior in his cousin; Stroke in his father and sister. Reviewed again in the office and no changes were noted except the new pain issues  Psychosocial history Patient was born and raised in [place].  He married twice. 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 Patient has multiple family member who has died in past few years. His father died in Dec 02, 2007 and mother died in 12/01/08  and 2 of his sister also died.  Patient lives by himself.  Mental status examination Patient is casually dressed and groomed.Marland KitchenHe maintained fair eye contact. He described his mood as fairly good and his affect is congruent His speech is slow but clear. His thought process is slow but logical linear and goal-directed. Marland Kitchen  He denies any active or passive suicidal thinking or homicidal thinking. He denies any auditory or visual hallucination. He is alert and oriented x3.  His attention and concentration is distracted. There were no paranoia or delusions present at this time. His insight judgment and impulse control is okay. Review of systems is positive for chronic back pain and pain in his left hand Diagnosis Axis I Maj. depressive disorder, polysubstance dependence by history Axis II deferred Axis III see medical history Axis IV mild to moderate Axis V  65-70  Plan/Discussion: I took his vitals.  I reviewed CC, tobacco/med/surg Hx, meds effects/ side effects, problem list, therapies and responses as well as current situation/symptoms discussed options. He will continue clonazepam for anxiety,  Seroquel for mood stabilization, Neurontin for anxiety. He'll continue  Prozac 20 mg twice a day. He'll reinstate his counseling here with Maurice Small  and return in 3 months or call if needed sooner  See orders and pt instructions for more details.  Medical Decision Making Problem Points:  Established problem, stable/improving (1), Review of last therapy session (1) and Review of psycho-social stressors (1) Data Points:  Review or order clinical lab tests (1) Review of medication regiment & side effects (2)  I certify that outpatient services furnished can reasonably be expected to improve the patient's condition.   [name], MDPatient ID: Morene Antu, male   DOB: 08/11/1955, 61 y.o.   MRN: [id number]

## 2016-07-16 ENCOUNTER — Ambulatory Visit (INDEPENDENT_AMBULATORY_CARE_PROVIDER_SITE_OTHER): Payer: Medicare HMO

## 2016-07-16 VITALS — BP 130/68 | HR 80 | Resp 18 | Ht 72.0 in | Wt 173.1 lb

## 2016-07-16 DIAGNOSIS — Z0000 Encounter for general adult medical examination without abnormal findings: Secondary | ICD-10-CM

## 2016-07-16 NOTE — Patient Instructions (Signed)
Thank you for choosing [facility] for your health care needs  The Annual Wellness Visit is designed to allow [name] the chance to assist you in preserving and improving you health.   Dr. Moshe Cipro will see you back in 4 months  If you need labs done these will be mailed to you.    Please review the health maintenance information provided to you  If you have any questions please feel free to contact the office.

## 2016-07-20 NOTE — Progress Notes (Addendum)
Subjective:   Marc [name] is a 60 y.o. male who presents for Medicare Annual/Subsequent preventive examination.     Objective:    Vitals: BP 130/68   Pulse 80   Resp 18   Ht 6' (1.829 m)   Wt 173 lb 1.3 oz (78.5 kg)   SpO2 98%   BMI 23.47 kg/m   Body mass index is 23.47 kg/m.  Tobacco History  Smoking Status  . Former Smoker  . Packs/day: 1.50  . Years: 8.00  . Types: Cigarettes  . Quit date: 08/13/1986  Smokeless Tobacco  . Never Used    Comment: quit 20 + yrs ago     Counseling given: Not Answered   Past Medical History:  Diagnosis Date  . Allergy   . Anxiety   . Arthritis   . Back problem   . Depression   . GERD (gastroesophageal reflux disease)   . Hepatitis C 1979  . Hepatitis C reactive    LIVER BX 2008-CHRONIC ACTIVE HEPATITIS  . HTN (hypertension)   . Hx of adenomatous colonic polyps 2008   due for surveillance 2017  . Hypercholesterolemia   . IBS (irritable bowel syndrome)   . Knee pain   . Leukopenia 11/06/2015  . Normal cardiac stress test 05/25/2011   [facility] [place]  . Normal echocardiogram 05/25/2011   EF 55%, mild TR  . Substance abuse    narcotic addiction  . Thrombocytopenia ([place]) 11/06/2015  . Thyroiditis    History of   Past Surgical History:  Procedure Laterality Date  . APPENDECTOMY    . CARPAL TUNNEL RELEASE     rt  . COLONOSCOPY  2008 SLF ARS D100 V8 PHEN 12.5   2 SIMPLE ADENOMAS (< 1 CM)  . ESOPHAGOGASTRODUODENOSCOPY  04/26/2012   Dr. Oneida Alar: Non-erosive gastritis (inflammation) was found in the gastric antrum but no H.pylori; multiple biopsies (duodenal bx negative for Celiac)/ The mucosa of the esophagus appeared normal  . EUS  09/08/2012   Dr. Ardis Hughs: CBD dilated but no stones. Query secondary to Sphincter of Oddi stenosis, ?dysfunction, but clinically without symptoms  . HARDWARE REMOVAL Right 02/12/2014   Procedure: HARDWARE REMOVAL;  Surgeon: Jolyn Nap, MD;  Location: [facility];   Service: Orthopedics;  Laterality: Right;  . KNEE ARTHROSCOPY    . Neurostimulator implant    . right ring finger    . spinal stenosis, had screws put in neck  04/11/2009   DR KRITZER  . TONSILLECTOMY    . ULNA OSTEOTOMY Right 02/12/2014   Procedure: RIGHT ULNAR SHORTENING AND OSTEOTOMY ;  Surgeon: Jolyn Nap, MD;  Location: [facility];  Service: Orthopedics;  Laterality: Right;  . ULNAR NERVE TRANSPOSITION    . UPPER GASTROINTESTINAL ENDOSCOPY  2008 SLF ABD PAIN WEIGHT LOSS d100 v8 phen 12.5   NL  . WRIST SURGERY Right jan 2015   Dr. Edmonia Lynch   Family History  Problem Relation Age of Onset  . Cancer Mother     BLADDER  . Cancer Sister     METS  . Emotional abuse Sister   . Stroke Sister   . Emotional abuse Sister   . Heart failure Father   . Stroke Father   . Alcohol abuse Father   . Dementia Paternal Aunt   . Alcohol abuse Paternal Uncle   . Dementia Paternal Uncle   . Dementia Paternal Grandmother   . ADD / ADHD Cousin   .  Bipolar disorder Cousin   . Anxiety disorder Cousin   . Depression Cousin     Committed suicide  . Alcohol abuse Cousin   . OCD Cousin   . Paranoid behavior Cousin   . Colon cancer Maternal Grandfather   . Heart defect      FAMILY HX  . Colon polyps Neg Hx   . Drug abuse Neg Hx   . Schizophrenia Neg Hx   . Seizures Neg Hx   . Sexual abuse Neg Hx   . Physical abuse Neg Hx    History  Sexual Activity  . Sexual activity: No    Outpatient Encounter Prescriptions as of 07/16/2016  Medication Sig  . acetaminophen (TYLENOL) 500 MG tablet Take 500 mg by mouth as needed.  Marland Kitchen azelastine (ASTELIN) 0.1 % nasal spray Place 2 sprays into both nostrils 2 (two) times daily. Use in each nostril as directed (Patient taking differently: Place 2 sprays into both nostrils as needed. Use in each nostril as directed)  . Buprenorphine HCl (BELBUCA) 75 MCG FILM Place 1 Film inside cheek 2 (two) times daily. For pain  . clonazePAM (KLONOPIN)  0.5 MG tablet Take 1 tablet (0.5 mg total) by mouth 3 (three) times daily as needed for anxiety.  . cloNIDine (CATAPRES) 0.2 MG tablet TAKE (1) TABLET BY MOUTH TWICE DAILY.  . cyclobenzaprine (FLEXERIL) 10 MG tablet Take 1 tablet (10 mg total) by mouth at bedtime.  Marland Kitchen FLUoxetine (PROZAC) 20 MG capsule Take 1 capsule (20 mg total) by mouth 2 (two) times daily.  Marland Kitchen gabapentin (NEURONTIN) 400 MG capsule Take 1 capsule (400 mg total) by mouth 3 (three) times daily.  Marland Kitchen ibuprofen (ADVIL,MOTRIN) 200 MG tablet Take 400-600 mg by mouth every 6 (six) hours as needed. For pain  . Lactobacillus (DIGESTIVE HEALTH PROBIOTIC PO) Take 1 capsule by mouth as needed.   Marland Kitchen omeprazole (PRILOSEC) 20 MG capsule Take 20 mg by mouth daily.  . QUEtiapine (SEROQUEL) 50 MG tablet Take by mouth 1 tablet 3 times daily and 2 at bedtime  . senna (SENOKOT) 8.6 MG TABS tablet Take 1 tablet by mouth daily as needed for mild constipation.   No facility-administered encounter medications on file as of 07/16/2016.     Activities of Daily Living In your present state of health, do you have any difficulty performing the following activities: 07/20/2016 07/16/2016  Hearing? - N  Vision? - N  Difficulty concentrating or making decisions? - N  Walking or climbing stairs? - Y  Dressing or bathing? - N  Doing errands, shopping? - N  Conservation officer, nature and eating ? N -  Using the Toilet? N -  In the past six months, have you accidently leaked urine? N -  Do you have problems with loss of bowel control? N -  Managing your Medications? N -  Managing your Finances? N -  Housekeeping or managing your Housekeeping? N -  Some recent data might be hidden    Patient Care Team: Fayrene Helper, MD as PCP - General Danie Binder, MD (Gastroenterology) Leta Baptist, MD as Consulting Physician (Otolaryngology) Drema Dallas, MD (Physical Medicine and Rehabilitation) Milly Jakob, MD as Consulting Physician (Orthopedic Surgery) Cloria Spring, MD as Consulting Physician ([place])   Assessment:    Medicare annual wellness visit, subsequent Annual exam as documented. Counseling done  re healthy lifestyle involving commitment to 150 minutes exercise per week, heart healthy diet, and attaining healthy weight.The importance of adequate sleep  also discussed. . Immunization and cancer screening needs are specifically addressed at this visit.    Exercise Activities and Dietary recommendations Current Exercise Habits: The patient does not participate in regular exercise at present (Patient does walk dog daily)  Goals    None     Fall Risk No flowsheet data found. Depression Screen PHQ 2/9 Scores 07/20/2016 10/21/2015  PHQ - 2 Score - 4  PHQ- 9 Score - 14  Exception Documentation Other- indicate reason in comment box -  Not completed Patient currently actively treated by Psychiatry  -    Cognitive Function        Immunization History  Administered Date(s) Administered  . Influenza Split 06/20/2012  . Influenza,inj,Quad PF,36+ Mos 04/24/2013, 08/28/2014, 06/13/2015  . Tdap 12/24/2011  . Zoster 10/04/2013   Screening Tests Health Maintenance  Topic Date Due  . COLONOSCOPY  09/29/2003  . INFLUENZA VACCINE  11/07/2016 (Originally 03/10/2016)  . TETANUS/TDAP  12/23/2021  . ZOSTAVAX  Completed  . Hepatitis C Screening  Completed  . HIV Screening  Completed      Plan:    Patient declined flu shot but will reconsider and come in if he changes his mind.   During the course of the visit the patient was educated and counseled about the following appropriate screening and preventive services:   Vaccines to include Pneumoccal, Influenza, Hepatitis B, Td, Zostavax, HCV  Electrocardiogram  Cardiovascular Disease  Colorectal cancer screening  Diabetes screening  Prostate Cancer Screening  Glaucoma screening  Nutrition counseling   Smoking cessation counseling  Patient Instructions (the written  plan) was given to the patient.    Denman George [place], [place]  [id number]

## 2016-07-29 NOTE — Assessment & Plan Note (Signed)
Annual exam as documented. Counseling done  re healthy lifestyle involving commitment to 150 minutes exercise per week, heart healthy diet, and attaining healthy weight.The importance of adequate sleep also discussed.  Immunization and cancer screening needs are specifically addressed at this visit.  

## 2016-07-30 ENCOUNTER — Encounter: Payer: Self-pay | Admitting: Family Medicine

## 2016-09-02 ENCOUNTER — Other Ambulatory Visit: Payer: Self-pay | Admitting: Family Medicine

## 2016-09-02 ENCOUNTER — Telehealth: Payer: Self-pay | Admitting: Family Medicine

## 2016-09-02 NOTE — Telephone Encounter (Signed)
pls let pt know that his insurance has contacted me re concerns about his medications as far as safety is concerned. I reviewed the medication list and think it best that he discontinue cyclobenzaprine , which I prescribe, and so will not refill. Has not collected since 12/05 , so should not be an issue, just informing and help him to understand that the concern is to prevent unintentional over medication with meds that interact with each other   ?? pls ask  Pls fax back my response to [place], left in your box Thanks

## 2016-09-03 NOTE — Telephone Encounter (Signed)
Letter sent to patient regarding this

## 2016-09-07 ENCOUNTER — Ambulatory Visit: Payer: Medicare HMO | Admitting: Gastroenterology

## 2016-09-07 ENCOUNTER — Telehealth (HOSPITAL_COMMUNITY): Payer: Self-pay | Admitting: *Deleted

## 2016-09-07 NOTE — Telephone Encounter (Signed)
lmtcb

## 2016-09-07 NOTE — Telephone Encounter (Signed)
voice message from patient left on 09/04/16 at 4:04 p.m. regarding his [employer] and Seroquel.

## 2016-09-08 ENCOUNTER — Emergency Department (HOSPITAL_COMMUNITY)
Admission: EM | Admit: 2016-09-08 | Discharge: 2016-09-08 | Disposition: A | Discharge status: Home / Self Care | Payer: Medicare HMO | Attending: Emergency Medicine | Admitting: Emergency Medicine

## 2016-09-08 ENCOUNTER — Encounter (HOSPITAL_COMMUNITY): Payer: Self-pay

## 2016-09-08 ENCOUNTER — Telehealth (HOSPITAL_COMMUNITY): Payer: Self-pay | Admitting: *Deleted

## 2016-09-08 DIAGNOSIS — S6992XA Unspecified injury of left wrist, hand and finger(s), initial encounter: Secondary | ICD-10-CM | POA: Diagnosis present

## 2016-09-08 DIAGNOSIS — Y929 Unspecified place or not applicable: Secondary | ICD-10-CM | POA: Diagnosis not present

## 2016-09-08 DIAGNOSIS — I10 Essential (primary) hypertension: Secondary | ICD-10-CM | POA: Diagnosis not present

## 2016-09-08 DIAGNOSIS — Y999 Unspecified external cause status: Secondary | ICD-10-CM | POA: Diagnosis not present

## 2016-09-08 DIAGNOSIS — Z87891 Personal history of nicotine dependence: Secondary | ICD-10-CM | POA: Insufficient documentation

## 2016-09-08 DIAGNOSIS — S61412A Laceration without foreign body of left hand, initial encounter: Secondary | ICD-10-CM | POA: Diagnosis not present

## 2016-09-08 DIAGNOSIS — Y939 Activity, unspecified: Secondary | ICD-10-CM | POA: Insufficient documentation

## 2016-09-08 DIAGNOSIS — W260XXA Contact with knife, initial encounter: Secondary | ICD-10-CM | POA: Insufficient documentation

## 2016-09-08 DIAGNOSIS — R110 Nausea: Secondary | ICD-10-CM | POA: Diagnosis not present

## 2016-09-08 MED ORDER — LIDOCAINE-EPINEPHRINE (PF) 2 %-1:200000 IJ SOLN
10.0000 mL | Freq: Once | INTRAMUSCULAR | Status: AC
  Administered 2016-09-08: 10 mL

## 2016-09-08 MED ORDER — PROMETHAZINE HCL 25 MG PO TABS: 25.0000 mg | ORAL_TABLET | Freq: Four times a day (QID) | ORAL | 0 refills | Status: DC | PRN

## 2016-09-08 MED ORDER — LIDOCAINE-EPINEPHRINE (PF) 2 %-1:200000 IJ SOLN
INTRAMUSCULAR | Status: AC
  Filled 2016-09-08: qty 20

## 2016-09-08 NOTE — ED Provider Notes (Signed)
[facility] DEPT Provider Note   CSN: [id number] Arrival date & time: 09/08/16  1729     History   Chief Complaint Chief Complaint  Patient presents with  . Laceration    HPI Marc [name] is a 61 y.o. male.  The history is provided by the patient.  Laceration   The incident occurred less than 1 hour ago. The laceration is located on the left hand. The laceration is 1 cm in size. The laceration mechanism was a a clean knife. The pain is at a severity of 7/10. The pain is moderate. The pain has been constant since onset. He reports no foreign bodies present. His tetanus status is UTD.    Past Medical History:  Diagnosis Date  . Allergy   . Anxiety   . Arthritis   . Back problem   . Depression   . GERD (gastroesophageal reflux disease)   . Hepatitis C 1979  . Hepatitis C reactive    LIVER BX 2008-CHRONIC ACTIVE HEPATITIS  . HTN (hypertension)   . Hx of adenomatous colonic polyps 2008   due for surveillance 2017  . Hypercholesterolemia   . IBS (irritable bowel syndrome)   . Knee pain   . Leukopenia 11/06/2015  . Normal cardiac stress test 05/25/2011   [facility]  . Normal echocardiogram 05/25/2011   EF 55%, mild TR  . Substance abuse    narcotic addiction  . Thrombocytopenia ([place]) 11/06/2015    Patient Active Problem List   Diagnosis Date Noted  . History of colonic polyps 11/13/2015  . Constipation due to opioid therapy 11/13/2015  . Leukopenia 11/06/2015  . Thrombocytopenia ([place]) 11/06/2015  . Back muscle spasm 10/21/2015  . HCV (hepatitis C virus) 06/16/2015  . Nocturia 06/16/2015  . Medicare annual wellness visit, subsequent 06/13/2015  . Allergic rhinitis 06/13/2015  . Chronic LBP 05/16/2015  . MDD (major depressive disorder) 01/21/2015  . Seasonal allergies 12/27/2014  . Dilation of biliary tract 07/14/2012  . Epigastric pain 07/14/2012  . RUQ pain 07/14/2012  . Trigger middle finger of left hand 06/20/2012  . Vitamin D  deficiency 06/20/2012  . Back pain with radiation 12/24/2011  . Narcotic addiction ([place]) 06/10/2011  . Essential hypertension 09/06/2010  . Prediabetes 04/10/2010  . INSOMNIA 11/07/2009  . Anxiety and depression 09/04/2009  . Chronic hepatitis C virus infection ([place]) 01/04/2009  . IRRITABLE BOWEL SYNDROME 01/04/2009  . GERD 01/03/2009    Past Surgical History:  Procedure Laterality Date  . APPENDECTOMY    . CARPAL TUNNEL RELEASE     rt  . COLONOSCOPY  2008 SLF ARS D100 V8 PHEN 12.5   2 SIMPLE ADENOMAS (< 1 CM)  . ESOPHAGOGASTRODUODENOSCOPY  04/26/2012   Dr. Oneida Alar: Non-erosive gastritis (inflammation) was found in the gastric antrum but no H.pylori; multiple biopsies (duodenal bx negative for Celiac)/ The mucosa of the esophagus appeared normal  . EUS  09/08/2012   Dr. Ardis Hughs: CBD dilated but no stones. Query secondary to Sphincter of Oddi stenosis, ?dysfunction, but clinically without symptoms  . HARDWARE REMOVAL Right 02/12/2014   Procedure: HARDWARE REMOVAL;  Surgeon: Jolyn Nap, MD;  Location: [facility];  Service: Orthopedics;  Laterality: Right;  . KNEE ARTHROSCOPY    . Neurostimulator implant    . right ring finger    . spinal stenosis, had screws put in neck  04/11/2009   DR KRITZER  . TONSILLECTOMY    . ULNA OSTEOTOMY Right 02/12/2014   Procedure: RIGHT  ULNAR SHORTENING AND OSTEOTOMY ;  Surgeon: Jolyn Nap, MD;  Location: [facility];  Service: Orthopedics;  Laterality: Right;  . ULNAR NERVE TRANSPOSITION    . UPPER GASTROINTESTINAL ENDOSCOPY  2008 SLF ABD PAIN WEIGHT LOSS d100 v8 phen 12.5   NL  . WRIST SURGERY Right jan 2015   Dr. Edmonia Lynch       Home Medications    Prior to Admission medications   Medication Sig Start Date End Date Taking? Authorizing Provider  acetaminophen (TYLENOL) 500 MG tablet Take 500 mg by mouth as needed.    Historical Provider, MD  azelastine (ASTELIN) 0.1 % nasal spray Place 2 sprays into both  nostrils 2 (two) times daily. Use in each nostril as directed Patient taking differently: Place 2 sprays into both nostrils as needed. Use in each nostril as directed 06/13/15   Fayrene Helper, MD  Buprenorphine HCl (BELBUCA) 75 MCG FILM Place 1 Film inside cheek 2 (two) times daily. For pain    Historical Provider, MD  clonazePAM (KLONOPIN) 0.5 MG tablet Take 1 tablet (0.5 mg total) by mouth 3 (three) times daily as needed for anxiety. 07/15/16 07/15/17  Cloria Spring, MD  cloNIDine (CATAPRES) 0.2 MG tablet TAKE (1) TABLET BY MOUTH TWICE DAILY. 06/12/16   Fayrene Helper, MD  FLUoxetine (PROZAC) 20 MG capsule Take 1 capsule (20 mg total) by mouth 2 (two) times daily. 07/15/16   Cloria Spring, MD  gabapentin (NEURONTIN) 400 MG capsule Take 1 capsule (400 mg total) by mouth 3 (three) times daily. 07/15/16 07/15/17  Cloria Spring, MD  ibuprofen (ADVIL,MOTRIN) 200 MG tablet Take 400-600 mg by mouth every 6 (six) hours as needed. For pain    Historical Provider, MD  Lactobacillus (DIGESTIVE HEALTH PROBIOTIC PO) Take 1 capsule by mouth as needed.     Historical Provider, MD  omeprazole (PRILOSEC) 20 MG capsule Take 20 mg by mouth daily.    Historical Provider, MD  promethazine (PHENERGAN) 25 MG tablet Take 1 tablet (25 mg total) by mouth every 6 (six) hours as needed for nausea or vomiting. 09/08/16   [name], PA-C  QUEtiapine (SEROQUEL) 50 MG tablet Take by mouth 1 tablet 3 times daily and 2 at bedtime 07/15/16   Cloria Spring, MD  senna (SENOKOT) 8.6 MG TABS tablet Take 1 tablet by mouth daily as needed for mild constipation.    Historical Provider, MD    Family History Family History  Problem Relation Age of Onset  . Cancer Mother     BLADDER  . Cancer Sister     METS  . Emotional abuse Sister   . Stroke Sister   . Emotional abuse Sister   . Heart failure Father   . Stroke Father   . Alcohol abuse Father   . Dementia Paternal Aunt   . Alcohol abuse Paternal Uncle   . Dementia  Paternal Uncle   . Dementia Paternal Grandmother   . ADD / ADHD Cousin   . Bipolar disorder Cousin   . Anxiety disorder Cousin   . Depression Cousin     Committed suicide  . Alcohol abuse Cousin   . OCD Cousin   . Paranoid behavior Cousin   . Colon cancer Maternal Grandfather   . Heart defect      FAMILY HX  . Colon polyps Neg Hx   . Drug abuse Neg Hx   . Schizophrenia Neg Hx   . Seizures Neg Hx   .  Sexual abuse Neg Hx   . Physical abuse Neg Hx     Social History Social History  Substance Use Topics  . Smoking status: Former Smoker    Packs/day: 1.50    Years: 8.00    Types: Cigarettes    Quit date: 08/13/1986  . Smokeless tobacco: Never Used     Comment: quit 20 + yrs ago  . Alcohol use No     Allergies   Levofloxacin; Amitiza [lubiprostone]; Ciprofloxacin; and Sulfa antibiotics   Review of Systems Review of Systems  Constitutional: Negative for chills and fever.  Respiratory: Negative for shortness of breath and wheezing.   Skin: Positive for wound.  Neurological: Negative for numbness.     Physical Exam Updated Vital Signs BP 132/94 (BP Location: Right Arm)   Pulse 77   Temp 98 F (36.7 C) (Oral)   Resp 16   Ht 5\' 10"  (1.778 m)   Wt 74.8 kg   SpO2 97%   BMI 23.68 kg/m   Physical Exam  Constitutional: He is oriented to person, place, and time. He appears well-developed and well-nourished.  HENT:  Head: Normocephalic.  Cardiovascular: Normal rate.   Pulmonary/Chest: Effort normal.  Musculoskeletal: He exhibits tenderness.  Full range of motion of fingers including thumb of left hand.  No decreased strength against resistance.  Less than 2 second cap refill in fingertips.  Neurological: He is alert and oriented to person, place, and time. No sensory deficit.  Skin: Laceration noted.  1 cm hemostatic laceration left thenar eminence.     ED Treatments / Results  Labs (all labs ordered are listed, but only abnormal results are displayed) Labs  Reviewed - No data to display  EKG  EKG Interpretation None       Radiology No results found.  Procedures Procedures (including critical care time)  LACERATION REPAIR Performed by: [name] Authorized by: [name] Consent: Verbal consent obtained. Risks and benefits: risks, benefits and alternatives were discussed Consent given by: patient Patient identity confirmed: provided demographic data Prepped and Draped in normal sterile fashion Wound explored  Laceration Location: left hand  Laceration Length: 1cm  No Foreign Bodies seen or palpated  Anesthesia: local infiltration  Local anesthetic: lidocaine 2% with epinephrine  Anesthetic total: 1 ml  Irrigation method: syringe, using NS and jet lavage with copious irrigation after cleaning skin with safe clens Amount of cleaning: copious Skin closure: ethilon 4-0  Number of sutures: 3  Technique: simple interupted  Patient tolerance: Patient tolerated the procedure well with no immediate complications.   Medications Ordered in ED Medications  lidocaine-EPINEPHrine (XYLOCAINE W/EPI) 2 %-1:200000 (PF) injection 10 mL (10 mLs Other Given by Other 09/08/16 1951)     Initial Impression / Assessment and Plan / ED Course  I have reviewed the triage vital signs and the nursing notes.  Pertinent labs & imaging results that were available during my care of the patient were reviewed by me and considered in my medical decision making (see chart for details).     Wound care instructions given.  Pt advised to have sutures removed in 10 days,  Return here sooner for any signs of infection including redness, swelling, worse pain or drainage of pus.  During procedure,  Patient endorses having a one-week history of mild flulike symptoms including subjective fever, nonproductive cough and mild nausea.  He was prescribed Phenergan for when necessary use.  Advised Tylenol, increase fluid intake, rest, follow-up with PCP if  symptoms do not  resolve.     Final Clinical Impressions(s) / ED Diagnoses   Final diagnoses:  Laceration of left hand without foreign body, initial encounter  Nausea    New Prescriptions Discharge Medication List as of 09/08/2016  7:43 PM    START taking these medications   Details  promethazine (PHENERGAN) 25 MG tablet Take 1 tablet (25 mg total) by mouth every 6 (six) hours as needed for nausea or vomiting., Starting Tue 09/08/2016, Print         [name], PA-C 09/08/16 2021    Milton Ferguson, MD 09/10/16 1625

## 2016-09-08 NOTE — ED Notes (Signed)
Pt verbalized understanding of discharge instructions. Pt ambulatory to waiting room.  

## 2016-09-08 NOTE — Telephone Encounter (Signed)
Pt walked into office stating that his Seroquel is needing PA. PA form pt brought into office was faxed to [facility]. Informed pt that PA are only completed on Tuesdays and Thursdays and pt verbalized understanding and agreed.

## 2016-09-08 NOTE — Discharge Instructions (Signed)
Have your sutures removed in 10 days.  Keep your wound clean and dry,  Until a good scab forms - you may then wash gently twice daily with mild soap and water, but dry completely after.  Get rechecked for any sign of infection (redness,  Swelling,  Increased pain or drainage of purulent fluid). ° °

## 2016-09-08 NOTE — Telephone Encounter (Signed)
Spoke with pt and see note in epic

## 2016-09-08 NOTE — Telephone Encounter (Signed)
patient brought letter from [facility].  It states that the Quetiapine Fumarate 50 mg. is on their 2018 form,ulary drug list, but they do not cover full amount prescribed.  they will not pay for more than what their quantity limit permits unless you obtain a limit exception from them.   They can be contacted at phone:[phone].  Their fax is [phone].

## 2016-09-08 NOTE — ED Notes (Signed)
Pt also c/o nausea, fatigue, nasal drainage, possible fever x 4 days.

## 2016-09-08 NOTE — ED Triage Notes (Signed)
Pt accidentally stabbed self in left hand with knifewhile trying to open package. Bleeding controlled. Not currently on blood thinners

## 2016-09-18 ENCOUNTER — Telehealth (HOSPITAL_COMMUNITY): Payer: Self-pay | Admitting: *Deleted

## 2016-09-18 NOTE — Telephone Encounter (Signed)
Prior authorization for Seroquel received. Submitted online with cover my meds. Awaiting decision to be faxed to office.

## 2016-09-21 ENCOUNTER — Ambulatory Visit: Payer: Self-pay | Admitting: Family Medicine

## 2016-09-21 NOTE — Telephone Encounter (Signed)
noted 

## 2016-09-22 ENCOUNTER — Telehealth (HOSPITAL_COMMUNITY): Payer: Self-pay | Admitting: *Deleted

## 2016-09-22 NOTE — Telephone Encounter (Signed)
Checked online with cover my meds on status of Seroquel. It was approved.

## 2016-09-23 NOTE — Telephone Encounter (Signed)
noted 

## 2016-09-28 ENCOUNTER — Telehealth (HOSPITAL_COMMUNITY): Payer: Self-pay | Admitting: *Deleted

## 2016-09-28 NOTE — Telephone Encounter (Signed)
Office received a fax from [facility] with approval for pt Quetiapine Fumarate 50 mg. Pt medication was approved for 150/30 and is good until 09-19-2017.

## 2016-09-29 ENCOUNTER — Encounter: Payer: Self-pay | Admitting: Family Medicine

## 2016-10-01 ENCOUNTER — Encounter (HOSPITAL_COMMUNITY): Payer: Self-pay | Admitting: Oncology

## 2016-10-01 ENCOUNTER — Encounter (HOSPITAL_COMMUNITY): Payer: Medicare HMO | Attending: Oncology | Admitting: Oncology

## 2016-10-01 ENCOUNTER — Encounter (HOSPITAL_COMMUNITY): Payer: Medicare HMO

## 2016-10-01 VITALS — BP 137/76 | HR 102 | Resp 16 | Ht 70.0 in | Wt 176.0 lb

## 2016-10-01 DIAGNOSIS — K760 Fatty (change of) liver, not elsewhere classified: Secondary | ICD-10-CM

## 2016-10-01 DIAGNOSIS — B1920 Unspecified viral hepatitis C without hepatic coma: Secondary | ICD-10-CM | POA: Diagnosis not present

## 2016-10-01 DIAGNOSIS — D696 Thrombocytopenia, unspecified: Secondary | ICD-10-CM | POA: Insufficient documentation

## 2016-10-01 DIAGNOSIS — R7989 Other specified abnormal findings of blood chemistry: Secondary | ICD-10-CM

## 2016-10-01 DIAGNOSIS — D708 Other neutropenia: Secondary | ICD-10-CM | POA: Insufficient documentation

## 2016-10-01 DIAGNOSIS — D72819 Decreased white blood cell count, unspecified: Secondary | ICD-10-CM

## 2016-10-01 LAB — CBC WITH DIFFERENTIAL/PLATELET
Basophils Absolute: 0 10*3/uL (ref 0.0–0.1)
Basophils Relative: 0 %
Eosinophils Absolute: 0 10*3/uL (ref 0.0–0.7)
Eosinophils Relative: 1 %
HCT: 41.7 % (ref 39.0–52.0)
Hemoglobin: 14.7 g/dL (ref 13.0–17.0)
Lymphocytes Relative: 33 %
Lymphs Abs: 1.8 10*3/uL (ref 0.7–4.0)
MCH: 32.3 pg (ref 26.0–34.0)
MCHC: 35.3 g/dL (ref 30.0–36.0)
MCV: 91.6 fL (ref 78.0–100.0)
Monocytes Absolute: 0.4 10*3/uL (ref 0.1–1.0)
Monocytes Relative: 7 %
Neutro Abs: 3.2 10*3/uL (ref 1.7–7.7)
Neutrophils Relative %: 59 %
Platelets: 101 10*3/uL — ABNORMAL LOW (ref 150–400)
RBC: 4.55 MIL/uL (ref 4.22–5.81)
RDW: 13.1 % (ref 11.5–15.5)
WBC: 5.4 10*3/uL (ref 4.0–10.5)

## 2016-10-01 LAB — FERRITIN: Ferritin: 58 ng/mL (ref 24–336)

## 2016-10-01 LAB — IRON AND TIBC
Iron: 114 ug/dL (ref 45–182)
Saturation Ratios: 28 % (ref 17.9–39.5)
TIBC: 400 ug/dL (ref 250–450)
UIBC: 286 ug/dL

## 2016-10-01 NOTE — Assessment & Plan Note (Signed)
Leukopenia with thrombocytopenia in the setting of hepatitis C and fatty liver. AND Elevated ferritin/iron saturation.  Labs today: CBC diff, iron/TIBC, ferritin.  I personally reviewed and went over laboratory results with the patient.  The results are noted within this dictation.  If iron studies remain elevated, I will add labs to work this up (ie hemochromatosis work-up).  Labs in 6 months: CBC diff, BMET, iron/TIBC, ferritin.  Return in 6 months for follow-up.

## 2016-10-01 NOTE — Progress Notes (Signed)
Tula Nakayama, MD [address] [place] [address]  Other neutropenia [facility]) - Plan: CBC with Differential, Basic metabolic panel, Iron and TIBC, Ferritin, CBC with Differential, Basic metabolic panel, Iron and TIBC, Ferritin  CURRENT THERAPY: Observation  INTERVAL HISTORY: Marc [name] 61 y.o. male returns for followup of Leukopenia with thrombocytopenia in the setting of hepatitis C and fatty liver. AND Elevated ferritin/iron saturation  He is doing well.  He denies any hospitalizations or recurrent antibiotic needs.    He continues to follow with his pain management physician.  He denies any abnormal bruising or rashes.  He has provide education regarding the definition of abnormal bruising.  He denies any issues with bleeding as well.  He denies any blood in his stools or dark stools.  He denies any spontaneous gingival bleeding as well.  Review of Systems  Constitutional: Negative.  Negative for chills, fever and weight loss.  HENT: Negative.   Eyes: Negative.   Respiratory: Negative.  Negative for cough.   Cardiovascular: Negative.  Negative for chest pain.  Gastrointestinal: Negative.  Negative for blood in stool, constipation, diarrhea, melena, nausea and vomiting.  Genitourinary: Negative.   Musculoskeletal: Negative.   Skin: Negative.   Neurological: Negative.  Negative for weakness.  Endo/Heme/Allergies: Negative.   Psychiatric/Behavioral: Negative.     Past Medical History:  Diagnosis Date  . Allergy   . Anxiety   . Arthritis   . Back problem   . Depression   . GERD (gastroesophageal reflux disease)   . Hepatitis C 1979  . Hepatitis C reactive    LIVER BX 2008-CHRONIC ACTIVE HEPATITIS  . HTN (hypertension)   . Hx of adenomatous colonic polyps 2008   due for surveillance 2017  . Hypercholesterolemia   . IBS (irritable bowel syndrome)   . Knee pain   . Leukopenia 11/06/2015  . Normal cardiac stress test 05/25/2011   [facility]  . Normal echocardiogram 05/25/2011   EF 55%, mild TR  . Substance abuse    narcotic addiction  . Thrombocytopenia ([place]) 11/06/2015    Past Surgical History:  Procedure Laterality Date  . APPENDECTOMY    . CARPAL TUNNEL RELEASE     rt  . COLONOSCOPY  2008 SLF ARS D100 V8 PHEN 12.5   2 SIMPLE ADENOMAS (< 1 CM)  . ESOPHAGOGASTRODUODENOSCOPY  04/26/2012   Dr. Oneida Alar: Non-erosive gastritis (inflammation) was found in the gastric antrum but no H.pylori; multiple biopsies (duodenal bx negative for Celiac)/ The mucosa of the esophagus appeared normal  . EUS  09/08/2012   Dr. Ardis Hughs: CBD dilated but no stones. Query secondary to Sphincter of Oddi stenosis, ?dysfunction, but clinically without symptoms  . HARDWARE REMOVAL Right 02/12/2014   Procedure: HARDWARE REMOVAL;  Surgeon: Jolyn Nap, MD;  Location: [facility];  Service: Orthopedics;  Laterality: Right;  . KNEE ARTHROSCOPY    . Neurostimulator implant    . right ring finger    . spinal stenosis, had screws put in neck  04/11/2009   DR KRITZER  . TONSILLECTOMY    . ULNA OSTEOTOMY Right 02/12/2014   Procedure: RIGHT ULNAR SHORTENING AND OSTEOTOMY ;  Surgeon: Jolyn Nap, MD;  Location: [facility];  Service: Orthopedics;  Laterality: Right;  . ULNAR NERVE TRANSPOSITION    . UPPER GASTROINTESTINAL ENDOSCOPY  2008 SLF ABD PAIN WEIGHT LOSS d100 v8 phen 12.5   NL  . WRIST  SURGERY Right jan 2015   Dr. Edmonia Lynch    Family History  Problem Relation Age of Onset  . Cancer Mother     BLADDER  . Cancer Sister     METS  . Emotional abuse Sister   . Stroke Sister   . Emotional abuse Sister   . Heart failure Father   . Stroke Father   . Alcohol abuse Father   . Dementia Paternal Aunt   . Alcohol abuse Paternal Uncle   . Dementia Paternal Uncle   . Dementia Paternal Grandmother   . ADD / ADHD Cousin   . Bipolar disorder Cousin   . Anxiety disorder Cousin   . Depression  Cousin     Committed suicide  . Alcohol abuse Cousin   . OCD Cousin   . Paranoid behavior Cousin   . Colon cancer Maternal Grandfather   . Heart defect      FAMILY HX  . Colon polyps Neg Hx   . Drug abuse Neg Hx   . Schizophrenia Neg Hx   . Seizures Neg Hx   . Sexual abuse Neg Hx   . Physical abuse Neg Hx     Social History   Social History  . Marital status: Divorced    Spouse name: N/A  . Number of children: N/A  . Years of education: N/A   Social History Main Topics  . Smoking status: Former Smoker    Packs/day: 1.50    Years: 8.00    Types: Cigarettes    Quit date: 08/13/1986  . Smokeless tobacco: Never Used     Comment: quit 20 + yrs ago  . Alcohol use No  . Drug use: No  . Sexual activity: No   Other Topics Concern  . None   Social History Narrative  . None     PHYSICAL EXAMINATION  ECOG PERFORMANCE STATUS: 0 - Asymptomatic  Vitals:   10/01/16 1425  BP: 137/76  Pulse: (!) 102  Resp: 16    GENERAL:alert, no distress, well nourished, well developed, comfortable, cooperative, smiling and unaccompanied SKIN: skin color, texture, turgor are normal, no rashes or significant lesions HEAD: Normocephalic, No masses, lesions, tenderness or abnormalities EYES: normal, EOMI, Conjunctiva are pink and non-injected EARS: External ears normal OROPHARYNX:lips, buccal mucosa, and tongue normal and mucous membranes are moist  NECK: supple, trachea midline LYMPH:  no palpable lymphadenopathy BREAST:not examined LUNGS: clear to auscultation  HEART: regular rate & rhythm ABDOMEN:abdomen soft and normal bowel sounds BACK: Back symmetric, no curvature. EXTREMITIES:less then 2 second capillary refill, no joint deformities, effusion, or inflammation, no skin discoloration, no cyanosis  NEURO: alert & oriented x 3 with fluent speech, no focal motor/sensory deficits, gait normal   LABORATORY DATA: CBC    Component Value Date/Time   WBC 5.4 10/01/2016 1500   RBC  4.55 10/01/2016 1500   HGB 14.7 10/01/2016 1500   HCT 41.7 10/01/2016 1500   PLT 101 (L) 10/01/2016 1500   MCV 91.6 10/01/2016 1500   MCH 32.3 10/01/2016 1500   MCHC 35.3 10/01/2016 1500   RDW 13.1 10/01/2016 1500   LYMPHSABS 1.8 10/01/2016 1500   MONOABS 0.4 10/01/2016 1500   EOSABS 0.0 10/01/2016 1500   BASOSABS 0.0 10/01/2016 1500      Chemistry      Component Value Date/Time   NA 138 06/04/2016 1244   K 4.1 06/04/2016 1244   CL 104 06/04/2016 1244   CO2 24 06/04/2016 1244   BUN 15  06/04/2016 1244   CREATININE 1.05 06/04/2016 1244      Component Value Date/Time   CALCIUM 9.5 06/04/2016 1244   ALKPHOS 67 06/04/2016 1244   AST 56 (H) 06/04/2016 1244   ALT 57 (H) 06/04/2016 1244   BILITOT 0.6 06/04/2016 1244      Lab Results  Component Value Date   IRON 136 11/06/2015   TIBC 319 11/06/2015   FERRITIN 714 (H) 11/06/2015     PENDING LABS:   RADIOGRAPHIC STUDIES:  No results found.   PATHOLOGY:    ASSESSMENT AND PLAN:  Leukopenia Leukopenia with thrombocytopenia in the setting of hepatitis C and fatty liver. AND Elevated ferritin/iron saturation.  Labs today: CBC diff, iron/TIBC, ferritin.  I personally reviewed and went over laboratory results with the patient.  The results are noted within this dictation.  If iron studies remain elevated, I will add labs to work this up (ie hemochromatosis work-up).  Labs in 6 months: CBC diff, BMET, iron/TIBC, ferritin.  Return in 6 months for follow-up.   ORDERS PLACED FOR THIS ENCOUNTER: Orders Placed This Encounter  Procedures  . CBC with Differential  . Basic metabolic panel  . Iron and TIBC  . Ferritin  . CBC with Differential  . Basic metabolic panel  . Iron and TIBC  . Ferritin    MEDICATIONS PRESCRIBED THIS ENCOUNTER: No orders of the defined types were placed in this encounter.   THERAPY PLAN:  Ongoing observation  All questions were answered. The patient knows to call the clinic with any  problems, questions or concerns. We can certainly see the patient much sooner if necessary.  Patient and plan discussed with Dr. Twana First and she is in agreement with the aforementioned.   This note is electronically signed by: Doy Mince 10/01/2016 5:54 PM

## 2016-10-01 NOTE — Patient Instructions (Addendum)
[place] at [facility] Discharge Instructions  RECOMMENDATIONS MADE BY THE CONSULTANT AND ANY TEST RESULTS WILL BE SENT TO YOUR REFERRING PHYSICIAN.  You were seen today by Kirby Crigler PA-C. Labs today, we will call you with results. Return in 6 months for labs and follow up.    Thank you for choosing [place] at [facility] to provide your oncology and hematology care.  To afford each patient quality time with our provider, please arrive at least 15 minutes before your scheduled appointment time.    If you have a lab appointment with the [facility] please come in thru the  Main Entrance and check in at the main information desk  You need to re-schedule your appointment should you arrive 10 or more minutes late.  We strive to give you quality time with our providers, and arriving late affects you and other patients whose appointments are after yours.  Also, if you no show three or more times for appointments you may be dismissed from the clinic at the providers discretion.     Again, thank you for choosing [facility].  Our hope is that these requests will decrease the amount of time that you wait before being seen by our physicians.       _____________________________________________________________  Should you have questions after your visit to [facility], please contact our office at [phone] between the hours of 8:30 a.m. and 4:30 p.m.  Voicemails left after 4:30 p.m. will not be returned until the following business day.  For prescription refill requests, have your pharmacy contact our office.       Resources For Cancer Patients and their Caregivers ? American Cancer Society: Can assist with transportation, wigs, general needs, runs Look Good Feel Better.        (360) 766-6197 ? Cancer Care: Provides financial assistance, online support groups, medication/co-pay assistance.  1-800-813-HOPE  629-217-8830) ? [facility] Assists [place] cancer patients and their families through emotional , educational and financial support.  419-815-9023 ? [place] DSS Where to apply for food stamps, Medicaid and utility assistance. 219-796-1095 ? [facility]: Transportation to medical appointments. 785-443-4798 ? Social Security Administration: May apply for disability if have a Stage IV cancer. 253-210-4607 (306)211-2667 ? [employer], Disability and Transit Services: Assists with nutrition, care and transit needs. [place] Support Programs: @10RELATIVEDAYS @ > Cancer Support Group  2nd Tuesday of the month 1pm-2pm, Journey Room  > Creative Journey  3rd Tuesday of the month 1130am-1pm, Journey Room  > Look Good Feel Better  1st Wednesday of the month 10am-12 noon, Journey Room (Call [place] to register [phone])

## 2016-10-02 ENCOUNTER — Encounter: Payer: Self-pay | Admitting: Gastroenterology

## 2016-10-02 ENCOUNTER — Ambulatory Visit (INDEPENDENT_AMBULATORY_CARE_PROVIDER_SITE_OTHER): Payer: Self-pay | Admitting: Gastroenterology

## 2016-10-02 VITALS — BP 116/78 | HR 81 | Temp 98.3°F | Ht 70.0 in | Wt 175.0 lb

## 2016-10-02 DIAGNOSIS — Z860100 Personal history of colon polyps, unspecified: Secondary | ICD-10-CM

## 2016-10-02 DIAGNOSIS — B182 Chronic viral hepatitis C: Secondary | ICD-10-CM

## 2016-10-02 MED ORDER — LUBIPROSTONE 24 MCG PO CAPS: 24.0000 ug | ORAL_CAPSULE | Freq: Two times a day (BID) | ORAL | 3 refills | Status: DC

## 2016-10-02 NOTE — Patient Instructions (Addendum)
We were unable to add on the orders to the blood, so we will need it done now. I'm sorry you have to get stuck again!  I have ordered an ultrasound of your liver for routine purposes.   I sent Amitiza to your pharmacy to have as needed.  We will see if we can triage you for a colonoscopy and upper endoscopy in April.   Return in 6 months.

## 2016-10-02 NOTE — Assessment & Plan Note (Signed)
History of simple adenomas in 2008, due for surveillance now. Would like to hold off until April due to scheduling conflicts. Will see if we may triage. Constipation well managed with OTC agents and uses Amitiza 24 mcg just as needed, which seems to work for him. Will need colonoscopy with Propofol.

## 2016-10-02 NOTE — Progress Notes (Signed)
Referring Provider: Fayrene Helper, MD Primary Care Physician:  Tula Nakayama, MD Primary GI: Dr. Oneida Alar   Chief Complaint  Patient presents with  . Hepatitis C    HPI:   Marc [name] is a 61 y.o. male presenting today with a history of Hep C genotype 1a, elastography F3F4, s/p treatment with Harvoni and 3 month post treatment negative RNA. History of adenomas and due for surveillance colonoscopy. Also due for EGD for screening purposes due to F3/F4 score. Chronic thrombocytopenia and leukopenia followed by hematology.    Due for HCV RNA now. Taking Senna for constipation. Took Amitiza prn for constipation. Would like prescription for 24 mcg. No rectal bleeding, no upper GI symptoms.   Past Medical History:  Diagnosis Date  . Allergy   . Anxiety   . Arthritis   . Back problem   . Depression   . GERD (gastroesophageal reflux disease)   . Hepatitis C 1979  . Hepatitis C reactive    LIVER BX 2008-CHRONIC ACTIVE HEPATITIS  . HTN (hypertension)   . Hx of adenomatous colonic polyps 2008   due for surveillance 2017  . Hypercholesterolemia   . IBS (irritable bowel syndrome)   . Knee pain   . Leukopenia 11/06/2015  . Normal cardiac stress test 05/25/2011   [facility] [place]  . Normal echocardiogram 05/25/2011   EF 55%, mild TR  . Substance abuse    narcotic addiction  . Thrombocytopenia ([place]) 11/06/2015    Past Surgical History:  Procedure Laterality Date  . APPENDECTOMY    . CARPAL TUNNEL RELEASE     rt  . COLONOSCOPY  2008 [facility] ARS D100 V8 PHEN 12.5   2 SIMPLE ADENOMAS (< 1 CM)  . ESOPHAGOGASTRODUODENOSCOPY  04/26/2012   Dr. Oneida Alar: Non-erosive gastritis (inflammation) was found in the gastric antrum but no H.pylori; multiple biopsies (duodenal bx negative for Celiac)/ The mucosa of the esophagus appeared normal  . EUS  09/08/2012   Dr. Ardis Hughs: CBD dilated but no stones. Query secondary to Sphincter of Oddi stenosis, ?dysfunction, but clinically  without symptoms  . HARDWARE REMOVAL Right 02/12/2014   Procedure: HARDWARE REMOVAL;  Surgeon: Jolyn Nap, MD;  Location: [facility];  Service: Orthopedics;  Laterality: Right;  . KNEE ARTHROSCOPY    . Neurostimulator implant    . right ring finger    . spinal stenosis, had screws put in neck  04/11/2009   DR KRITZER  . TONSILLECTOMY    . ULNA OSTEOTOMY Right 02/12/2014   Procedure: RIGHT ULNAR SHORTENING AND OSTEOTOMY ;  Surgeon: Jolyn Nap, MD;  Location: [place];  Service: Orthopedics;  Laterality: Right;  . ULNAR NERVE TRANSPOSITION    . UPPER GASTROINTESTINAL ENDOSCOPY  2008 SLF ABD PAIN WEIGHT LOSS d100 v8 phen 12.5   NL  . WRIST SURGERY Right jan 2015   Dr. Edmonia Lynch    Current Outpatient Prescriptions  Medication Sig Dispense Refill  . acetaminophen (TYLENOL) 500 MG tablet Take 500 mg by mouth as needed.    . Buprenorphine HCl (BELBUCA) 75 MCG FILM Place 1 Film inside cheek 2 (two) times daily. For pain    . clonazePAM (KLONOPIN) 0.5 MG tablet Take 1 tablet (0.5 mg total) by mouth 3 (three) times daily as needed for anxiety. 90 tablet 2  . cloNIDine (CATAPRES) 0.2 MG tablet TAKE (1) TABLET BY MOUTH TWICE DAILY. 60 tablet 3  . FLUoxetine (PROZAC) 20 MG capsule  Take 1 capsule (20 mg total) by mouth 2 (two) times daily. 60 capsule 2  . gabapentin (NEURONTIN) 400 MG capsule Take 1 capsule (400 mg total) by mouth 3 (three) times daily. 90 capsule 2  . ibuprofen (ADVIL,MOTRIN) 200 MG tablet Take 400-600 mg by mouth every 6 (six) hours as needed. For pain    . Lactobacillus (DIGESTIVE HEALTH PROBIOTIC PO) Take 1 capsule by mouth as needed.     Marland Kitchen omeprazole (PRILOSEC) 20 MG capsule Take 20 mg by mouth daily.    . QUEtiapine (SEROQUEL) 50 MG tablet Take by mouth 1 tablet 3 times daily and 2 at bedtime 150 tablet 2  . senna (SENOKOT) 8.6 MG TABS tablet Take 1 tablet by mouth daily as needed for mild constipation.    Marland Kitchen lubiprostone (AMITIZA) 24  MCG capsule Take 1 capsule (24 mcg total) by mouth 2 (two) times daily with a meal. 60 capsule 3   No current facility-administered medications for this visit.     Allergies as of 10/02/2016 - Review Complete 10/02/2016  Allergen Reaction Noted  . Levofloxacin Nausea Only and Other (See Comments)   . Ciprofloxacin  06/13/2015  . Sulfa antibiotics  12/02/2012    Family History  Problem Relation Age of Onset  . Cancer Mother     BLADDER  . Cancer Sister     METS  . Emotional abuse Sister   . Stroke Sister   . Emotional abuse Sister   . Heart failure Father   . Stroke Father   . Alcohol abuse Father   . Dementia Paternal Aunt   . Alcohol abuse Paternal Uncle   . Dementia Paternal Uncle   . Dementia Paternal Grandmother   . ADD / ADHD Cousin   . Bipolar disorder Cousin   . Anxiety disorder Cousin   . Depression Cousin     Committed suicide  . Alcohol abuse Cousin   . OCD Cousin   . Paranoid behavior Cousin   . Colon cancer Maternal Grandfather   . Heart defect      FAMILY HX  . Colon polyps Neg Hx   . Drug abuse Neg Hx   . Schizophrenia Neg Hx   . Seizures Neg Hx   . Sexual abuse Neg Hx   . Physical abuse Neg Hx     Social History   Social History  . Marital status: Divorced    Spouse name: N/A  . Number of children: N/A  . Years of education: N/A   Social History Main Topics  . Smoking status: Former Smoker    Packs/day: 1.50    Years: 8.00    Types: Cigarettes    Quit date: 08/13/1986  . Smokeless tobacco: Never Used     Comment: quit 20 + yrs ago  . Alcohol use No  . Drug use: No  . Sexual activity: No   Other Topics Concern  . None   Social History Narrative  . None    Review of Systems: As mentioned in HPI   Physical Exam: BP 116/78   Pulse 81   Temp 98.3 F (36.8 C) (Oral)   Ht 5\' 10"  (1.778 m)   Wt 175 lb (79.4 kg)   BMI 25.11 kg/m  General:   Alert and oriented. No distress noted. Pleasant and cooperative.  Head:  Normocephalic  and atraumatic. Eyes:  Conjuctiva clear without scleral icterus. Mouth:  Oral mucosa pink and moist. Good dentition. No lesions. Heart:  S1, S2 present  without murmurs, rubs, or gallops. Regular rate and rhythm. Abdomen:  +BS, soft, non-tender and non-distended. No rebound or guarding. No HSM or masses noted. Msk:  Symmetrical without gross deformities. Normal posture Extremities:  Without edema. Neurologic:  Alert and  oriented x4;  grossly normal neurologically. Psych:  Alert and cooperative. Normal mood and affect.

## 2016-10-02 NOTE — Assessment & Plan Note (Addendum)
Genotype 1a, elastography F3/F4, s/p Harvoni treatment last year. Approximately 6 months post treatment and will check RNA now. Due for US abdomen now for surveillance, along with EGD for screening. Would like to postpone until April. Will see if we may triage. Will need to do with Propofol due to polypharmacy. 6 month return.

## 2016-10-05 ENCOUNTER — Telehealth: Payer: Self-pay

## 2016-10-05 NOTE — Progress Notes (Signed)
cc'ed to pcp °

## 2016-10-05 NOTE — Telephone Encounter (Signed)
LMOVM and informed pt of Ultrasound appt 10/08/16 at 11:30am, arrive at 11:15am. NPO after midnight. Letter also mailed.

## 2016-10-05 NOTE — Telephone Encounter (Signed)
Pt called office and confirmed that he received voicemail for Ultrasound.

## 2016-10-05 NOTE — Telephone Encounter (Signed)
Called [employer] for PA of Ultrasound abdomen limited. No PA needed. Ref# [id number]

## 2016-10-08 ENCOUNTER — Ambulatory Visit (HOSPITAL_COMMUNITY): Admission: RE | Admit: 2016-10-08 | Payer: Medicare HMO | Source: Ambulatory Visit

## 2016-10-10 ENCOUNTER — Other Ambulatory Visit: Payer: Self-pay | Admitting: Family Medicine

## 2016-10-13 ENCOUNTER — Ambulatory Visit (HOSPITAL_COMMUNITY)
Admission: RE | Admit: 2016-10-13 | Discharge: 2016-10-13 | Disposition: A | Discharge status: Home / Self Care | Payer: Medicare HMO | Source: Ambulatory Visit | Attending: Gastroenterology | Admitting: Gastroenterology

## 2016-10-13 ENCOUNTER — Encounter (HOSPITAL_COMMUNITY): Payer: Self-pay | Admitting: Psychiatry

## 2016-10-13 ENCOUNTER — Ambulatory Visit (INDEPENDENT_AMBULATORY_CARE_PROVIDER_SITE_OTHER): Payer: Medicare HMO | Admitting: Psychiatry

## 2016-10-13 VITALS — BP 100/78 | HR 74 | Ht 70.0 in | Wt 176.8 lb

## 2016-10-13 DIAGNOSIS — Z811 Family history of alcohol abuse and dependence: Secondary | ICD-10-CM

## 2016-10-13 DIAGNOSIS — F331 Major depressive disorder, recurrent, moderate: Secondary | ICD-10-CM | POA: Diagnosis not present

## 2016-10-13 DIAGNOSIS — B182 Chronic viral hepatitis C: Secondary | ICD-10-CM | POA: Diagnosis not present

## 2016-10-13 DIAGNOSIS — Z810 Family history of intellectual disabilities: Secondary | ICD-10-CM | POA: Diagnosis not present

## 2016-10-13 DIAGNOSIS — F1920 Other psychoactive substance dependence, uncomplicated: Secondary | ICD-10-CM

## 2016-10-13 DIAGNOSIS — Z888 Allergy status to other drugs, medicaments and biological substances status: Secondary | ICD-10-CM | POA: Diagnosis not present

## 2016-10-13 DIAGNOSIS — Z882 Allergy status to sulfonamides status: Secondary | ICD-10-CM

## 2016-10-13 DIAGNOSIS — Z818 Family history of other mental and behavioral disorders: Secondary | ICD-10-CM

## 2016-10-13 MED ORDER — CLONAZEPAM 0.5 MG PO TABS: 0.5000 mg | ORAL_TABLET | Freq: Three times a day (TID) | ORAL | 2 refills | Status: DC | PRN

## 2016-10-13 MED ORDER — FLUOXETINE HCL 20 MG PO CAPS: 20.0000 mg | ORAL_CAPSULE | Freq: Two times a day (BID) | ORAL | 2 refills | Status: DC

## 2016-10-13 MED ORDER — QUETIAPINE FUMARATE 50 MG PO TABS: ORAL_TABLET | ORAL | 2 refills | Status: DC

## 2016-10-13 MED ORDER — GABAPENTIN 400 MG PO CAPS: 400.0000 mg | ORAL_CAPSULE | Freq: Three times a day (TID) | ORAL | 2 refills | Status: DC

## 2016-10-13 NOTE — Progress Notes (Signed)
Patient ID: Marc [name], male   DOB: August 23, 1955, 61 y.o.   MRN: [id number] Patient ID: Marc [name], male   DOB: April 16, 1956, 61 y.o.   MRN: [id number] Patient ID: Marc [name], male   DOB: 1956/03/03, 61 y.o.   MRN: [id number] Patient ID: Marc [name], male   DOB: November 01, 1955, 61 y.o.   MRN: [id number] Patient ID: Marc [name], male   DOB: [birth year], 61 y.o.   MRN: [id number] Patient ID: Marc [name], male   DOB: 04/27/1956, 61 y.o.   MRN: [id number] Patient ID: Marc [name], male   DOB: 01/27/1956, 61 y.o.   MRN: [id number] Patient ID: Marc [name], male   DOB: Oct 22, 1955, 61 y.o.   MRN: [id number] Patient ID: Marc [name], male   DOB: 07/31/1956, 61 y.o.   MRN: [id number] Patient ID: Marc [name], male   DOB: 1956/06/27, 61 y.o.   MRN: [id number] Patient ID: Marc [name], male   DOB: 02-07-1956, 61 y.o.   MRN: [id number] Patient ID: Marc [name], male   DOB: [birth year], 61 y.o.   MRN: [id number] Patient ID: Marc [name], male   DOB: [birth year], 61 y.o.   MRN: [id number] Patient ID: Marc [name], male   DOB: [birth year], 61 y.o.   MRN: [id number] [facility] [id number] Progress Note Marc [name] MRN: [id number] DOB: [birth year] Age: 62 y.o.  Date: 10/13/2016   Chief Complaint: Chief Complaint  Patient presents with  . Depression  . Anxiety  . 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 He has one son age 90 years old and lives in [place]  The patient states he has a history depression that started around 2009. Prior to that year he been treated with ribavirin to treat hepatitis C which made him very depressed. He's also had a number of deaths in his family-both sisters have died as well as both parents in the last several years. His son is very close to him and he only talks to him periodically.  Currently the patient is struggling with  medical problems including neck and back pain. He recently had a neurostimulator installed in his back which is helped to some degree. Lately he's been a bit more depressed because his aunt keeps calling and wanting to talk about all the dead relatives. He's not been seeing his counselor regularly and I think it would be helpful for him to get back into counseling. He does have friends and he walks his dog 2 miles a day. He admits to passive suicidal ideation but would never hurt himself.  The patient returns for followup after 3 months. He still a little better than last time but still has a lot of chronic pain. He switched insurance and doesn't cover his pain medication and it's very expensive so he is trying to ration it out. I urged him to call the pain clinic about this. Overall his mood is pretty good and he is trying to get out more with people and not be so isolated.  Past psychiatric history Patient has history of depression for many years. He has taken in the past Wellbutrin Cymbalta and Celexa. He denies any history of suicidal attempt.  Alcohol and substance use history Patient has significant history of alcohol with multiple rehabilitation and detox treatment. He has history of DWI. He  also had history of taking recreational drugs and he was in the [employer]. He has history of intravenous drug use that cause hepatitis C. Patient also had history of using pain medication.  Allergies: Allergies  Allergen Reactions  . Levofloxacin Nausea Only and Other (See Comments)    "Creepy" feeling, skin didn't feel right, sort of itchy.  . Ciprofloxacin     Other reaction(s): Sweating (intolerance)  . Sulfa Antibiotics    Medical History: Past Medical History:  Diagnosis Date  . Allergy   . Anxiety   . Arthritis   . Back problem   . Depression   . GERD (gastroesophageal reflux disease)   . Hepatitis C 1979  . Hepatitis C reactive    LIVER BX 2008-CHRONIC ACTIVE HEPATITIS  . HTN  (hypertension)   . Hx of adenomatous colonic polyps 2008   due for surveillance 2017  . Hypercholesterolemia   . IBS (irritable bowel syndrome)   . Knee pain   . Leukopenia 11/06/2015  . Normal cardiac stress test 05/25/2011   [facility] [place]  . Normal echocardiogram 05/25/2011   EF 55%, mild TR  . Substance abuse    narcotic addiction  . Thrombocytopenia ([place]) 11/06/2015  Cervical fusion Neck surgery Neck pain Hepatitis C Hypertension Benign tremors High blood pressure He sees Dr. Moshe Cipro and Dr. Paulita Cradle for pain management.  Surgical History: Past Surgical History:  Procedure Laterality Date  . APPENDECTOMY    . CARPAL TUNNEL RELEASE     rt  . COLONOSCOPY  2008 [facility] ARS D100 V8 PHEN 12.5   2 SIMPLE ADENOMAS (< 1 CM)  . ESOPHAGOGASTRODUODENOSCOPY  04/26/2012   Dr. Oneida Alar: Non-erosive gastritis (inflammation) was found in the gastric antrum but no H.pylori; multiple biopsies (duodenal bx negative for Celiac)/ The mucosa of the esophagus appeared normal  . EUS  09/08/2012   Dr. Ardis Hughs: CBD dilated but no stones. Query secondary to Sphincter of Oddi stenosis, ?dysfunction, but clinically without symptoms  . HARDWARE REMOVAL Right 02/12/2014   Procedure: HARDWARE REMOVAL;  Surgeon: Jolyn Nap, MD;  Location: [facility];  Service: Orthopedics;  Laterality: Right;  . KNEE ARTHROSCOPY    . Neurostimulator implant    . right ring finger    . spinal stenosis, had screws put in neck  04/11/2009   DR KRITZER  . TONSILLECTOMY    . ULNA OSTEOTOMY Right 02/12/2014   Procedure: RIGHT ULNAR SHORTENING AND OSTEOTOMY ;  Surgeon: Jolyn Nap, MD;  Location: [facility];  Service: Orthopedics;  Laterality: Right;  . ULNAR NERVE TRANSPOSITION    . UPPER GASTROINTESTINAL ENDOSCOPY  2008 SLF ABD PAIN WEIGHT LOSS d100 v8 phen 12.5   NL  . WRIST SURGERY Right jan 2015   Dr. Edmonia Lynch   Family History: family history includes ADD / ADHD in his  cousin; Alcohol abuse in his cousin, father, and paternal uncle; Anxiety disorder in his cousin; Bipolar disorder in his cousin; Cancer in his mother and sister; Colon cancer in his maternal grandfather; Dementia in his paternal aunt, paternal grandmother, and paternal uncle; Depression in his cousin; Emotional abuse in his sister and sister; Heart failure in his father; OCD in his cousin; Paranoid behavior in his cousin; Stroke in his father and sister. Reviewed again in the office and no changes were noted except the new pain issues  Psychosocial history Patient was born and raised in [place].  He married twice. 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 Patient has multiple family member who has died in past few years. His father died in 2007/12/02 and mother died in December 01, 2008  and 2 of his sister also died.  Patient lives by himself.  Mental status examination Patient is casually dressed and groomed.Marland KitchenHe maintained fair eye contact. He described his mood as fairly good and his affect is congruent His speech is slow but clear. His thought process is slow but logical linear and goal-directed. Marland Kitchen  He denies any active or passive suicidal thinking or homicidal thinking. He denies any auditory or visual hallucination. He is alert and oriented x3.  His attention and concentration is distracted. There were no paranoia or delusions present at this time. His insight judgment and impulse control is okay. Review of systems is positive for chronic back pain and pain in his left hand Diagnosis Axis I Maj. depressive disorder, polysubstance dependence by history Axis II deferred Axis III see medical history Axis IV mild to moderate Axis V 65-70  Plan/Discussion: I took his vitals.  I reviewed CC, tobacco/med/surg Hx, meds effects/ side effects, problem list, therapies and responses as well as current situation/symptoms discussed options. He will continue clonazepam for anxiety,   Seroquel for mood stabilization, Neurontin for anxiety. He'll continue  Prozac 20 mg twice a day. He'll  return in 3 months or call if needed sooner  See orders and pt instructions for more details.  Medical Decision Making Problem Points:  Established problem, stable/improving (1), Review of last therapy session (1) and Review of psycho-social stressors (1) Data Points:  Review or order clinical lab tests (1) Review of medication regiment & side effects (2)  I certify that outpatient services furnished can reasonably be expected to improve the patient's condition.   [name], MDPatient ID: Marc [name], male   DOB: 02-May-1956, 61 y.o.   MRN: [id number]

## 2016-10-19 NOTE — Progress Notes (Signed)
Ultrasound reviewed. No mass. Will need serial ultrasounds due to history of Hep C and Fibrosis score F3/F4. Needs to complete CMP, HCV RNA labs that were given at time of his appt. When he is ready to proceed with EGD for screening, let us know. Let's make sure he is on recall to return 6 months.

## 2016-10-20 ENCOUNTER — Ambulatory Visit (INDEPENDENT_AMBULATORY_CARE_PROVIDER_SITE_OTHER): Payer: Medicare HMO | Admitting: Family Medicine

## 2016-10-20 ENCOUNTER — Encounter: Payer: Self-pay | Admitting: Family Medicine

## 2016-10-20 VITALS — BP 118/80 | HR 82 | Resp 15 | Ht 70.0 in | Wt 177.0 lb

## 2016-10-20 DIAGNOSIS — G894 Chronic pain syndrome: Secondary | ICD-10-CM

## 2016-10-20 DIAGNOSIS — Z1211 Encounter for screening for malignant neoplasm of colon: Secondary | ICD-10-CM

## 2016-10-20 DIAGNOSIS — Z0000 Encounter for general adult medical examination without abnormal findings: Secondary | ICD-10-CM | POA: Insufficient documentation

## 2016-10-20 LAB — POC HEMOCCULT BLD/STL (OFFICE/1-CARD/DIAGNOSTIC): Fecal Occult Blood, POC: NEGATIVE

## 2016-10-20 NOTE — Patient Instructions (Signed)
Wellness visit with Marc [name] in September, call if you need me sooner  Fasting labs this week please.  Rectal exam today is normal, bUT you still nEED your colonoscopy, please f/u with dr Oneida Alar' office about this  You are referred to Dr Merlene Laughter re pain management, his office will contact you with appt info  Please work on good  health habits so that your health will improve. 1. Commitment to daily physical activity for 30 to 60  minutes, if you are able to do this.  2. Commitment to wise food choices. Aim for half of your  food intake to be vegetable and fruit, one quarter starchy foods, and one quarter protein. Try to eat on a regular schedule  3 meals per day, snacking between meals should be limited to vegetables or fruits or small portions of nuts. 64 ounces of water per day is generally recommended, unless you have specific health conditions, like heart failure or kidney failure where you will need to limit fluid intake.  3. Commitment to sufficient and a  good quality of physical and mental rest daily, generally between 6 to 8 hours per day.  WITH PERSISTANCE AND PERSEVERANCE, THE IMPOSSIBLE , BECOMES THE NORM! Thank you  for choosing [facility]. We consider it a privelige to serve you.  Delivering excellent health care in a caring and  compassionate way is our goal.  Partnering with you,  so that together we can achieve this goal is our strategy.

## 2016-10-20 NOTE — Progress Notes (Signed)
   Marc [name]     MRN: [id number]      DOB: 03-27-1956   HPI: Patient is in for annual physical exam. C/o chronic pain and the need to establish with a local pain center . Immunization is reviewed , refuses flu vaccine, states it makes him weak for 1 week after getting it.    PE; Pleasant male, alert and oriented x 3, in no cardio-pulmonary distress. Afebrile. HEENT No facial trauma or asymetry. Sinuses non tender. EOMI, pupils equally reactive to light. External ears normal, tympanic membranes clear. Oropharynx moist, no exudate. Neck: decreased though adequate ROM, no adenopathy,JVD or thyromegaly.No bruits.  Chest: Clear to ascultation bilaterally.No crackles or wheezes. Non tender to palpation   Cardiovascular system; Heart sounds normal,  S1 and  S2 ,no S3.  No murmur, or thrill. Apical beat not displaced Peripheral pulses normal.  Abdomen: Soft, non tender, no organomegaly or masses. No bruits. Bowel sounds normal. No guarding, tenderness or rebound.  Rectal:  Normal sphincter tone. No hemorrhoids or  masses. guaiac negative stool. Prostate smooth and firm    Musculoskeletal exam: Decreased ROM of spine,adequate in  hips , shoulders and knees. No deformity ,swelling or crepitus noted. No muscle wasting or atrophy.   Neurologic: Cranial nerves 2 to 12 intact. Power, tone ,sensation and reflexes normal throughout. No disturbance in gait. No tremor.  Skin: Intact, no ulceration, erythema , scaling or rash noted. Pigmentation normal throughout  Psych; Normal mood and affect. Judgement and concentration normal   Assessment & Plan:  Annual physical exam Annual exam as documented. Counseling done  re healthy lifestyle involving commitment to 150 minutes exercise per week, heart healthy diet, and attaining healthy weight.The importance of adequate sleep also discussed.  Immunization and cancer screening needs are specifically addressed at this  visit.   Chronic pain syndrome Longstanding h/o generalized pain, has had c spine surgery, and has established spine and disc disease down entire spine. Also c/o small joint and muscle pain. Needs treatment through pain clinic as he ahs an opioid addiction history for which he has been treated inpatient in the past. Requests change to local pain Specialist, and states most recent medication did "not work " for him. Will refer to Dr Merlene Laughter

## 2016-10-21 NOTE — Progress Notes (Signed)
Pt is aware. He is working out transportation in order to schedule EGD. PT ALSO SAID HE IS TO SCHEDULE COLONOSCOPY. PLEASE ADVISE SO WHEN HE CALLS BACK.

## 2016-10-21 NOTE — Assessment & Plan Note (Signed)
Annual exam as documented. Counseling done  re healthy lifestyle involving commitment to 150 minutes exercise per week, heart healthy diet, and attaining healthy weight.The importance of adequate sleep also discussed.  Immunization and cancer screening needs are specifically addressed at this visit.  

## 2016-10-21 NOTE — Assessment & Plan Note (Signed)
Longstanding h/o generalized pain, has had c spine surgery, and has established spine and disc disease down entire spine. Also c/o small joint and muscle pain. Needs treatment through pain clinic as he ahs an opioid addiction history for which he has been treated inpatient in the past. Requests change to local pain Specialist, and states most recent medication did "not work " for him. Will refer to Dr Merlene Laughter

## 2016-10-21 NOTE — Progress Notes (Signed)
He is going to call back when he talks to someone about transportation. Then I will triage/schedule for both.

## 2016-10-21 NOTE — Progress Notes (Signed)
LMOM to call.

## 2016-10-21 NOTE — Progress Notes (Signed)
Yes, due for colonoscopy as well due to history of adenomas.

## 2016-10-22 ENCOUNTER — Other Ambulatory Visit (HOSPITAL_COMMUNITY)
Admission: RE | Admit: 2016-10-22 | Discharge: 2016-10-22 | Disposition: A | Discharge status: Home / Self Care | Payer: Medicare HMO | Source: Ambulatory Visit | Attending: Family Medicine | Admitting: Family Medicine

## 2016-10-22 DIAGNOSIS — Z0000 Encounter for general adult medical examination without abnormal findings: Secondary | ICD-10-CM | POA: Diagnosis not present

## 2016-10-22 LAB — LIPID PANEL
Cholesterol: 229 mg/dL — ABNORMAL HIGH (ref 0–200)
HDL: 57 mg/dL (ref >40)
LDL Cholesterol: 145 mg/dL — ABNORMAL HIGH (ref 0–99)
Total CHOL/HDL Ratio: 4 RATIO
Triglycerides: 134 mg/dL (ref <150)
VLDL: 27 mg/dL (ref 0–40)

## 2016-10-22 LAB — TSH: TSH: 1.724 u[IU]/mL (ref 0.350–4.500)

## 2016-10-23 LAB — HEMOGLOBIN A1C
HEMOGLOBIN A1C: 5.5 % (ref 4.8–5.6)
Mean Plasma Glucose: 111 mg/dL

## 2016-10-25 ENCOUNTER — Encounter: Payer: Self-pay | Admitting: Family Medicine

## 2016-11-09 ENCOUNTER — Other Ambulatory Visit: Payer: Self-pay | Admitting: Family Medicine

## 2016-11-17 ENCOUNTER — Encounter: Payer: Self-pay | Admitting: Family Medicine

## 2016-11-17 ENCOUNTER — Ambulatory Visit: Payer: Self-pay | Admitting: Family Medicine

## 2016-12-08 ENCOUNTER — Encounter: Payer: Self-pay | Admitting: Gastroenterology

## 2016-12-11 ENCOUNTER — Other Ambulatory Visit (HOSPITAL_COMMUNITY): Payer: Self-pay | Admitting: Psychiatry

## 2016-12-25 ENCOUNTER — Telehealth (HOSPITAL_COMMUNITY): Payer: Self-pay | Admitting: *Deleted

## 2016-12-28 NOTE — Telephone Encounter (Signed)
Notified patient that labs are not due until August. He verbalized understanding.

## 2016-12-29 ENCOUNTER — Ambulatory Visit: Payer: Self-pay | Admitting: Family Medicine

## 2016-12-31 NOTE — Progress Notes (Signed)
REVIEWED-NO ADDITIONAL RECOMMENDATIONS. 

## 2017-01-08 ENCOUNTER — Other Ambulatory Visit: Payer: Self-pay | Admitting: Family Medicine

## 2017-01-11 ENCOUNTER — Other Ambulatory Visit: Payer: Self-pay

## 2017-01-11 ENCOUNTER — Encounter (HOSPITAL_COMMUNITY): Payer: Self-pay | Admitting: Psychiatry

## 2017-01-11 ENCOUNTER — Ambulatory Visit (INDEPENDENT_AMBULATORY_CARE_PROVIDER_SITE_OTHER): Payer: Medicare HMO | Admitting: Psychiatry

## 2017-01-11 VITALS — BP 150/90 | HR 98 | Ht 70.0 in | Wt 174.0 lb

## 2017-01-11 DIAGNOSIS — F1920 Other psychoactive substance dependence, uncomplicated: Secondary | ICD-10-CM | POA: Diagnosis not present

## 2017-01-11 DIAGNOSIS — Z810 Family history of intellectual disabilities: Secondary | ICD-10-CM | POA: Diagnosis not present

## 2017-01-11 DIAGNOSIS — Z818 Family history of other mental and behavioral disorders: Secondary | ICD-10-CM

## 2017-01-11 DIAGNOSIS — F331 Major depressive disorder, recurrent, moderate: Secondary | ICD-10-CM

## 2017-01-11 DIAGNOSIS — Z811 Family history of alcohol abuse and dependence: Secondary | ICD-10-CM

## 2017-01-11 MED ORDER — CYCLOBENZAPRINE HCL 10 MG PO TABS: ORAL_TABLET | ORAL | 3 refills | Status: DC

## 2017-01-11 MED ORDER — QUETIAPINE FUMARATE 50 MG PO TABS: ORAL_TABLET | ORAL | 2 refills | Status: DC

## 2017-01-11 MED ORDER — FLUOXETINE HCL 20 MG PO CAPS: 20.0000 mg | ORAL_CAPSULE | Freq: Two times a day (BID) | ORAL | 2 refills | Status: DC

## 2017-01-11 MED ORDER — CLONAZEPAM 0.5 MG PO TABS: 0.5000 mg | ORAL_TABLET | Freq: Three times a day (TID) | ORAL | 2 refills | Status: DC | PRN

## 2017-01-11 MED ORDER — GABAPENTIN 400 MG PO CAPS
400.0000 mg | ORAL_CAPSULE | Freq: Three times a day (TID) | ORAL | 2 refills | Status: DC
Start: 2017-01-11 — End: 2017-03-25

## 2017-01-11 NOTE — Telephone Encounter (Signed)
Patient requesting Rx generic flexeril be called in @ [name].  He is completely out. Has appt w/Dr. Moshe Cipro this Wednesday.  (he went thru the pharmacy and there should be a fax)

## 2017-01-11 NOTE — Progress Notes (Signed)
Patient ID: QUINT CHESTNUT, male   DOB: 1956-03-05, 61 y.o.   MRN: [id number] Patient ID: LIZZIE COKLEY, male   DOB: 1955-11-03, 61 y.o.   MRN: [id number] Patient ID: DRAKEN FARRIOR, male   DOB: 10-Aug-1956, 61 y.o.   MRN: [id number] Patient ID: LYRIK BURESH, male   DOB: November 28, 1955, 61 y.o.   MRN: [id number] Patient ID: AMER ALCINDOR, male   DOB: 1955-11-14, 61 y.o.   MRN: [id number] Patient ID: KABIR BRANNOCK, male   DOB: 1955/09/05, 61 y.o.   MRN: [id number] Patient ID: KYRO JOSWICK, male   DOB: 19-Feb-1956, 61 y.o.   MRN: [id number] Patient ID: LUISFELIPE ENGELSTAD, male   DOB: May 01, 1956, 61 y.o.   MRN: [id number] Patient ID: RAVINDER LUKEHART, male   DOB: March 02, 1956, 61 y.o.   MRN: [id number] Patient ID: MICKAL MENO, male   DOB: 11/09/1955, 61 y.o.   MRN: [id number] Patient ID: FOWLER ANTOS, male   DOB: 1956/07/08, 61 y.o.   MRN: [id number] Patient ID: DARIC KOREN, male   DOB: [birth year], 61 y.o.   MRN: [id number] Patient ID: ZIV WELCHEL, male   DOB: 1956/06/13, 61 y.o.   MRN: [id number] Patient ID: DAWSYN RAMSARAN, male   DOB: 1956/07/08, 61 y.o.   MRN: [id number] [facility] [id number] Progress Note GAJE TENNYSON MRN: [id number] DOB: 01-Apr-1956 Age: 61 y.o.  Date: 01/11/2017   Chief Complaint: Chief Complaint  Patient presents with  . Follow-up  . Depression  . 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 He has one son age 40 years old and lives in [place]  The patient states he has a history depression that started around 2009. Prior to that year he been treated with ribavirin to treat hepatitis C which made him very depressed. He's also had a number of deaths in his family-both sisters have died as well as both parents in the last several years. His son is very close to him and he only talks to him periodically.  Currently the patient is struggling with  medical problems including neck and back pain. He recently had a neurostimulator installed in his back which is helped to some degree. Lately he's been a bit more depressed because his aunt keeps calling and wanting to talk about all the dead relatives. He's not been seeing his counselor regularly and I think it would be helpful for him to get back into counseling. He does have friends and he walks his dog 2 miles a day. He admits to passive suicidal ideation but would never hurt himself.  The patient returns for followup after 3 months. He still a little better than last time . 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 He's also been playing his guitar more. He still has a lot of chronic pain and is trying to get in with a local pain management clinic without much success. The gabapentin helps to some degree  Past psychiatric history Patient has history of depression for many years. He has taken in the past Wellbutrin Cymbalta and Celexa. He denies any history of suicidal attempt.  Alcohol and substance use history Patient has significant history of alcohol with multiple rehabilitation and detox treatment.  He has history of DWI. He also had history of taking recreational drugs and he was in the [employer]. He has history of intravenous drug use that cause hepatitis C. Patient also had history of using pain medication.  Allergies: Allergies  Allergen Reactions  . Levofloxacin Nausea Only and Other (See Comments)    "Creepy" feeling, skin didn't feel right, sort of itchy.  . Ciprofloxacin     Other reaction(s): Sweating (intolerance)  . Sulfa Antibiotics    Medical History: Past Medical History:  Diagnosis Date  . Allergy   . Anxiety   . Arthritis   . Back problem   . Depression   . GERD (gastroesophageal reflux disease)   . Hepatitis C 1979  . Hepatitis C reactive    LIVER BX  2008-CHRONIC ACTIVE HEPATITIS  . HTN (hypertension)   . Hx of adenomatous colonic polyps 2008   due for surveillance 2017  . Hypercholesterolemia   . IBS (irritable bowel syndrome)   . Knee pain   . Leukopenia 11/06/2015  . Normal cardiac stress test 05/25/2011   [facility] [place]  . Normal echocardiogram 05/25/2011   EF 55%, mild TR  . Substance abuse    narcotic addiction  . Thrombocytopenia ([place]) 11/06/2015  Cervical fusion Neck surgery Neck pain Hepatitis C Hypertension Benign tremors High blood pressure He sees Dr. Moshe Cipro and Dr. Paulita Cradle for pain management.  Surgical History: Past Surgical History:  Procedure Laterality Date  . APPENDECTOMY    . CARPAL TUNNEL RELEASE     rt  . COLONOSCOPY  2008 [facility] ARS D100 V8 PHEN 12.5   2 SIMPLE ADENOMAS (< 1 CM)  . ESOPHAGOGASTRODUODENOSCOPY  04/26/2012   Dr. Oneida Alar: Non-erosive gastritis (inflammation) was found in the gastric antrum but no H.pylori; multiple biopsies (duodenal bx negative for Celiac)/ The mucosa of the esophagus appeared normal  . EUS  09/08/2012   Dr. Ardis Hughs: CBD dilated but no stones. Query secondary to Sphincter of Oddi stenosis, ?dysfunction, but clinically without symptoms  . HARDWARE REMOVAL Right 02/12/2014   Procedure: HARDWARE REMOVAL;  Surgeon: Jolyn Nap, MD;  Location: [facility];  Service: Orthopedics;  Laterality: Right;  . KNEE ARTHROSCOPY    . Neurostimulator implant    . right ring finger    . spinal stenosis, had screws put in neck  04/11/2009   DR KRITZER  . TONSILLECTOMY    . ULNA OSTEOTOMY Right 02/12/2014   Procedure: RIGHT ULNAR SHORTENING AND OSTEOTOMY ;  Surgeon: Jolyn Nap, MD;  Location: [facility];  Service: Orthopedics;  Laterality: Right;  . ULNAR NERVE TRANSPOSITION    . UPPER GASTROINTESTINAL ENDOSCOPY  2008 [facility] ABD PAIN WEIGHT LOSS d100 v8 phen 12.5   NL  . WRIST SURGERY Right jan 2015   Dr. Edmonia Lynch   Family  History: family history includes ADD / ADHD in his cousin; Alcohol abuse in his cousin, father, and paternal uncle; Anxiety disorder in his cousin; Bipolar disorder in his cousin; Cancer in his mother and sister; Colon cancer in his maternal grandfather; Dementia in his paternal aunt, paternal grandmother, and paternal uncle; Depression in his cousin; Emotional abuse in his sister and sister; Heart failure in his father; OCD in his cousin; Paranoid behavior in his cousin; Stroke in his father and sister. Reviewed again in the office and no changes were noted except the new pain issues  Psychosocial history Patient was born and raised in [place].  He married twice.  Patient served in the [employer] but got honorable discharge  Patient has multiple family member who has died in past few years. His father died in 12-05-2007 and mother died in 04-Dec-2008  and 2 of his sister also died.  Patient lives by himself.  Mental status examination Patient is casually dressed and groomed. He is limping .He maintained fair eye contact. He described his mood as fairly good and his affect is congruent His speech is slow but clear. His thought process is slow but logical linear and goal-directed. Marland Kitchen  He denies any active or passive suicidal thinking or homicidal thinking. He denies any auditory or visual hallucination. He is alert and oriented x3.  His attention and concentration is distracted. There were no paranoia or delusions present at this time. His insight judgment and impulse control is okay. Review of systems is positive for chronic back pain and pain in his left hand Diagnosis Axis I Maj. depressive disorder, polysubstance dependence by history Axis II deferred Axis III see medical history Axis IV mild to moderate Axis V 65-70  Plan/Discussion: I took his vitals.  I reviewed CC, tobacco/med/surg Hx, meds effects/ side effects, problem list, therapies and responses as well as current situation/symptoms discussed  options. He will continue clonazepam for anxiety,  Seroquel for mood stabilization, Neurontin for anxiety. He'll continue  Prozac 20 mg twice a day. He'll  return in 3 months or call if needed sooner  See orders and pt instructions for more details.  Medical Decision Making Problem Points:  Established problem, stable/improving (1), Review of last therapy session (1) and Review of psycho-social stressors (1) Data Points:  Review or order clinical lab tests (1) Review of medication regiment & side effects (2)  I certify that outpatient services furnished can reasonably be expected to improve the patient's condition.   [name], MDPatient ID: Morene Antu, male   DOB: [birth year], 61 y.o.   MRN: [id number]

## 2017-01-11 NOTE — Telephone Encounter (Signed)
This has been done.

## 2017-01-13 ENCOUNTER — Encounter: Payer: Self-pay | Admitting: Family Medicine

## 2017-01-13 ENCOUNTER — Ambulatory Visit (INDEPENDENT_AMBULATORY_CARE_PROVIDER_SITE_OTHER): Payer: Medicare HMO | Admitting: Family Medicine

## 2017-01-13 VITALS — BP 128/82 | HR 76 | Temp 98.1°F | Resp 16 | Ht 70.0 in | Wt 175.1 lb

## 2017-01-13 DIAGNOSIS — F329 Major depressive disorder, single episode, unspecified: Secondary | ICD-10-CM

## 2017-01-13 DIAGNOSIS — F419 Anxiety disorder, unspecified: Secondary | ICD-10-CM | POA: Diagnosis not present

## 2017-01-13 DIAGNOSIS — I10 Essential (primary) hypertension: Secondary | ICD-10-CM | POA: Diagnosis not present

## 2017-01-13 DIAGNOSIS — G894 Chronic pain syndrome: Secondary | ICD-10-CM | POA: Diagnosis not present

## 2017-01-13 DIAGNOSIS — Z125 Encounter for screening for malignant neoplasm of prostate: Secondary | ICD-10-CM

## 2017-01-13 DIAGNOSIS — F32A Depression, unspecified: Secondary | ICD-10-CM

## 2017-01-13 DIAGNOSIS — J3089 Other allergic rhinitis: Secondary | ICD-10-CM

## 2017-01-13 MED ORDER — MONTELUKAST SODIUM 10 MG PO TABS: 10.0000 mg | ORAL_TABLET | Freq: Every day | ORAL | 4 refills | Status: DC

## 2017-01-13 MED ORDER — AZELASTINE HCL 0.1 % NA SOLN: 2.0000 | Freq: Two times a day (BID) | NASAL | 4 refills | Status: DC

## 2017-01-13 MED ORDER — OLOPATADINE HCL 0.1 % OP SOLN: 1.0000 [drp] | Freq: Two times a day (BID) | OPHTHALMIC | 2 refills | Status: DC

## 2017-01-13 NOTE — Patient Instructions (Addendum)
F/u in 5 month, call if you need me before  Wellness visit as scheduled  Three new meds sent for allergies, eye drops, tablet and nose spray  Please get PSA the day you get lab in August at the hospital in August  It is important that you exercise regularly at least 30 minutes 5 times a week. If you develop chest pain, have severe difficulty breathing, or feel very tired, stop exercising immediately and seek medical attention  Please continue to reduce fried and fatty foods  Thank you  for choosing [facility]. We consider it a privelige to serve you.  Delivering excellent health care in a caring and  compassionate way is our goal.  Partnering with you,  so that together we can achieve this goal is our strategy.

## 2017-01-13 NOTE — Assessment & Plan Note (Signed)
Controlled, no change in medication DASH diet and commitment to daily physical activity for a minimum of 30 minutes discussed and encouraged, as a part of hypertension management. The importance of attaining a healthy weight is also discussed.  BP/Weight 01/13/2017 10/20/2016 10/02/2016 10/01/2016 09/08/2016 07/16/2016 68/07/7516  Systolic BP 001 749 449 675 916 384 665  Diastolic BP 82 80 78 76 94 68 108  Wt. (Lbs) 175.08 177 175 176 165 173.08 181.2  BMI 25.12 25.4 25.11 25.25 23.68 23.47 26  Some encounter information is confidential and restricted. Go to Review Flowsheets activity to see all data.

## 2017-01-13 NOTE — Assessment & Plan Note (Signed)
Uncontrolled start singulair, pataolastelin

## 2017-01-14 ENCOUNTER — Telehealth: Payer: Self-pay | Admitting: Family Medicine

## 2017-01-14 NOTE — Telephone Encounter (Signed)
Patient left voicemail on nurse line requesting a refill of prednisone.  Cb#: [phone]

## 2017-01-15 ENCOUNTER — Other Ambulatory Visit: Payer: Self-pay | Admitting: Family Medicine

## 2017-01-15 MED ORDER — PREDNISONE 5 MG (21) PO TBPK: 5.0000 mg | ORAL_TABLET | ORAL | 0 refills | Status: DC

## 2017-01-15 NOTE — Progress Notes (Signed)
pred 

## 2017-01-15 NOTE — Telephone Encounter (Signed)
Was seen yesterday. Was wanting a refill on the prednisone

## 2017-01-15 NOTE — Telephone Encounter (Signed)
Prednisone sent to [facility]

## 2017-01-16 ENCOUNTER — Encounter: Payer: Self-pay | Admitting: Family Medicine

## 2017-01-16 NOTE — Assessment & Plan Note (Signed)
Increased and uncontrolled symptoms, Astelin, Singulair and Patanol prescribed, explained proper technique for use of nasal spray and the need to commit to daily  Use of meds until symptoms clearly subside. Pt declined prednisone burst at visit , but called for this the following  day

## 2017-01-16 NOTE — Progress Notes (Signed)
   Marc [name]     MRN: [id number]      DOB: 24-Apr-1956   HPI [name] is here for follow up and re-evaluation of chronic medical conditions, medication management and review of any available recent lab and radiology data.  Preventive health is updated, specifically  Cancer screening and Immunization.   Questions or concerns regarding consultations or procedures which the PT has had in the interim are  addressed. The PT denies any adverse reactions to current medications since the last visit.  There are no new concerns.  C/o increased allergy symptoms, declined prednisone at visit , but called the following day for a dose pack, c/o itchy watery eyes ROS Denies recent fever or chills. Denies sinus pressure,  C/o increased nasal congestion, denies ear pain or sore throat. Denies chest congestion, productive cough or wheezing. Denies chest pains, palpitations and leg swelling Denies abdominal pain, nausea, vomiting,diarrhea or constipation.   Denies dysuria, frequency, hesitancy or incontinence. Denies joint pain, swelling and limitation in mobility. Denies headaches, seizures, numbness, or tingling. Denies depression, anxiety or insomnia. Denies skin break down or rash.   PE  BP 128/82 (BP Location: Left Arm, Patient Position: Sitting, Cuff Size: Normal)   Pulse 76   Temp 98.1 F (36.7 C) (Temporal)   Resp 16   Ht 5\' 10"  (1.778 m)   Wt 175 lb 1.3 oz (79.4 kg)   SpO2 98%   BMI 25.12 kg/m   Patient alert and oriented and in no cardiopulmonary distress.  HEENT: No facial asymmetry, EOMI,   oropharynx pink and moist.  Neck supple no JVD, no mass.No sinus tenderness, nasal mucosa erythematous and edematous  Chest: Clear to auscultation bilaterally.  CVS: S1, S2 no murmurs, no S3.Regular rate.  ABD: Soft non tender.   Ext: No edema  MS: Adequate ROM spine, shoulders, hips and knees.  Skin: Intact, no ulcerations or rash noted.  Psych: Good eye contact, normal affect.  Memory intact not anxious or depressed appearing.  CNS: CN 2-12 intact, power,  normal throughout.no focal deficits noted.   Assessment & Plan  Allergic rhinitis Uncontrolled start singulair, pataolastelin  Essential hypertension Controlled, no change in medication DASH diet and commitment to daily physical activity for a minimum of 30 minutes discussed and encouraged, as a part of hypertension management. The importance of attaining a healthy weight is also discussed.  BP/Weight 01/13/2017 10/20/2016 10/02/2016 10/01/2016 09/08/2016 07/16/2016 28/78/6767  Systolic BP 209 470 962 836 629 476 546  Diastolic BP 82 80 78 76 94 68 108  Wt. (Lbs) 175.08 177 175 176 165 173.08 181.2  BMI 25.12 25.4 25.11 25.25 23.68 23.47 26  Some encounter information is confidential and restricted. Go to Review Flowsheets activity to see all data.       Seasonal allergies Increased and uncontrolled symptoms, Astelin, Singulair and Patanol prescribed, explained proper technique for use of nasal spray and the need to commit to daily  Use of meds until symptoms clearly subside. Pt declined prednisone burst at visit , but called for this the following  day  Anxiety and depression controlled and managed by psychiatry  Chronic pain syndrome Awaiting reinstatement in a pain clinic

## 2017-01-16 NOTE — Assessment & Plan Note (Signed)
controlled and managed by psychiatry

## 2017-01-16 NOTE — Assessment & Plan Note (Signed)
Awaiting reinstatement in a pain clinic

## 2017-01-29 DIAGNOSIS — Z79891 Long term (current) use of opiate analgesic: Secondary | ICD-10-CM | POA: Diagnosis not present

## 2017-01-29 DIAGNOSIS — M5116 Intervertebral disc disorders with radiculopathy, lumbar region: Secondary | ICD-10-CM | POA: Diagnosis not present

## 2017-01-29 DIAGNOSIS — M792 Neuralgia and neuritis, unspecified: Secondary | ICD-10-CM | POA: Diagnosis not present

## 2017-01-29 DIAGNOSIS — F419 Anxiety disorder, unspecified: Secondary | ICD-10-CM | POA: Diagnosis not present

## 2017-01-29 DIAGNOSIS — F329 Major depressive disorder, single episode, unspecified: Secondary | ICD-10-CM | POA: Diagnosis not present

## 2017-01-29 DIAGNOSIS — M79601 Pain in right arm: Secondary | ICD-10-CM | POA: Diagnosis not present

## 2017-01-29 DIAGNOSIS — M545 Low back pain: Secondary | ICD-10-CM | POA: Diagnosis not present

## 2017-01-29 DIAGNOSIS — G894 Chronic pain syndrome: Secondary | ICD-10-CM | POA: Diagnosis not present

## 2017-02-02 ENCOUNTER — Encounter: Payer: Self-pay | Admitting: Gastroenterology

## 2017-03-01 DIAGNOSIS — M549 Dorsalgia, unspecified: Secondary | ICD-10-CM | POA: Diagnosis not present

## 2017-03-01 DIAGNOSIS — M5116 Intervertebral disc disorders with radiculopathy, lumbar region: Secondary | ICD-10-CM | POA: Diagnosis not present

## 2017-03-01 DIAGNOSIS — M79609 Pain in unspecified limb: Secondary | ICD-10-CM | POA: Diagnosis not present

## 2017-03-01 DIAGNOSIS — F419 Anxiety disorder, unspecified: Secondary | ICD-10-CM | POA: Diagnosis not present

## 2017-03-01 DIAGNOSIS — Z79891 Long term (current) use of opiate analgesic: Secondary | ICD-10-CM | POA: Diagnosis not present

## 2017-03-01 DIAGNOSIS — M545 Low back pain: Secondary | ICD-10-CM | POA: Diagnosis not present

## 2017-03-01 DIAGNOSIS — M79601 Pain in right arm: Secondary | ICD-10-CM | POA: Diagnosis not present

## 2017-03-01 DIAGNOSIS — M792 Neuralgia and neuritis, unspecified: Secondary | ICD-10-CM | POA: Diagnosis not present

## 2017-03-02 ENCOUNTER — Other Ambulatory Visit: Payer: Self-pay | Admitting: Family Medicine

## 2017-03-18 ENCOUNTER — Telehealth (HOSPITAL_COMMUNITY): Payer: Self-pay | Admitting: *Deleted

## 2017-03-18 ENCOUNTER — Ambulatory Visit (HOSPITAL_COMMUNITY): Payer: Medicare HMO | Admitting: Psychiatry

## 2017-03-18 ENCOUNTER — Encounter (HOSPITAL_COMMUNITY): Payer: Self-pay

## 2017-03-18 NOTE — Telephone Encounter (Signed)
Pt called and lm stating stating he was cleaning and he accidentally threw away his Klonopin. Pt then walked into office to let staff know what happened. Per pt, he was cleaning and thinks he threw away his Klonopin bottle. Per pt, he still have refills left but the pharmacy said it is too early to fill his Klonopin. Per pt he went to the pharmacy and the pharmacist informed him they will only refill his medication early if Dr. Harrington Challenger approve an early fill. Called pt pharmacy and spoke with Trip. Per pharmacist, he do not remember pt doing this before but pt seemed a bit off when he came into the pharmacy to request an early fill. Pt is currently sitting in lobby waiting for a response

## 2017-03-18 NOTE — Telephone Encounter (Signed)
We asked patient to do a urine test and he went into the bathroom for about 10 minutes with no result. He seemed a bit zoned out but vitals are normal. He last picked up clonazepam om 7/19 so is not eligible to pick up refill until 8/19. I told him there is nothing more I can do unless he gives the urine tox test. He is also on hydrocodone from a NP at [facility]

## 2017-03-19 ENCOUNTER — Ambulatory Visit (HOSPITAL_COMMUNITY): Payer: Medicare HMO | Admitting: Psychiatry

## 2017-03-19 ENCOUNTER — Encounter (HOSPITAL_COMMUNITY): Payer: Self-pay

## 2017-03-19 NOTE — Telephone Encounter (Signed)
Due pt no showing for his appt on 03-19-2017, staff celled pt to resch appt and to inform him that an appt is needed due to pt lm stating he will see staff on Monday when he does not have a scheduled appt for Monday 03-12-2017. Office number was provided on pt voicemail.

## 2017-03-24 ENCOUNTER — Other Ambulatory Visit (HOSPITAL_COMMUNITY): Payer: Self-pay | Admitting: *Deleted

## 2017-03-24 DIAGNOSIS — D696 Thrombocytopenia, unspecified: Secondary | ICD-10-CM

## 2017-03-25 ENCOUNTER — Ambulatory Visit (INDEPENDENT_AMBULATORY_CARE_PROVIDER_SITE_OTHER): Payer: Medicare HMO | Admitting: Psychiatry

## 2017-03-25 ENCOUNTER — Encounter: Payer: Self-pay | Admitting: Family Medicine

## 2017-03-25 ENCOUNTER — Ambulatory Visit (HOSPITAL_COMMUNITY)
Admission: RE | Admit: 2017-03-25 | Discharge: 2017-03-25 | Disposition: A | Discharge status: Home / Self Care | Payer: Medicare HMO | Source: Ambulatory Visit | Attending: Family Medicine | Admitting: Family Medicine

## 2017-03-25 ENCOUNTER — Encounter (HOSPITAL_COMMUNITY): Payer: Self-pay | Admitting: Psychiatry

## 2017-03-25 ENCOUNTER — Ambulatory Visit (INDEPENDENT_AMBULATORY_CARE_PROVIDER_SITE_OTHER): Payer: Medicare HMO | Admitting: Family Medicine

## 2017-03-25 VITALS — BP 139/87 | HR 76 | Ht 70.0 in | Wt 168.4 lb

## 2017-03-25 VITALS — BP 120/80 | HR 75 | Resp 16 | Ht 70.0 in | Wt 169.0 lb

## 2017-03-25 DIAGNOSIS — F339 Major depressive disorder, recurrent, unspecified: Secondary | ICD-10-CM | POA: Diagnosis not present

## 2017-03-25 DIAGNOSIS — Z818 Family history of other mental and behavioral disorders: Secondary | ICD-10-CM

## 2017-03-25 DIAGNOSIS — F419 Anxiety disorder, unspecified: Secondary | ICD-10-CM | POA: Diagnosis not present

## 2017-03-25 DIAGNOSIS — M549 Dorsalgia, unspecified: Secondary | ICD-10-CM | POA: Diagnosis not present

## 2017-03-25 DIAGNOSIS — Z811 Family history of alcohol abuse and dependence: Secondary | ICD-10-CM | POA: Diagnosis not present

## 2017-03-25 DIAGNOSIS — R0789 Other chest pain: Secondary | ICD-10-CM

## 2017-03-25 DIAGNOSIS — F329 Major depressive disorder, single episode, unspecified: Secondary | ICD-10-CM | POA: Diagnosis not present

## 2017-03-25 DIAGNOSIS — I10 Essential (primary) hypertension: Secondary | ICD-10-CM | POA: Diagnosis not present

## 2017-03-25 DIAGNOSIS — B1920 Unspecified viral hepatitis C without hepatic coma: Secondary | ICD-10-CM | POA: Diagnosis not present

## 2017-03-25 DIAGNOSIS — M25542 Pain in joints of left hand: Secondary | ICD-10-CM

## 2017-03-25 DIAGNOSIS — F331 Major depressive disorder, recurrent, moderate: Secondary | ICD-10-CM | POA: Diagnosis not present

## 2017-03-25 DIAGNOSIS — M542 Cervicalgia: Secondary | ICD-10-CM

## 2017-03-25 DIAGNOSIS — G894 Chronic pain syndrome: Secondary | ICD-10-CM | POA: Diagnosis not present

## 2017-03-25 DIAGNOSIS — S299XXA Unspecified injury of thorax, initial encounter: Secondary | ICD-10-CM | POA: Diagnosis not present

## 2017-03-25 MED ORDER — QUETIAPINE FUMARATE 50 MG PO TABS: ORAL_TABLET | ORAL | 2 refills | Status: DC

## 2017-03-25 MED ORDER — FLUOXETINE HCL 20 MG PO CAPS: 20.0000 mg | ORAL_CAPSULE | Freq: Two times a day (BID) | ORAL | 2 refills | Status: DC

## 2017-03-25 MED ORDER — GABAPENTIN 400 MG PO CAPS: 400.0000 mg | ORAL_CAPSULE | Freq: Three times a day (TID) | ORAL | 2 refills | Status: DC
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
## 2017-03-25 NOTE — Progress Notes (Signed)
Patient ID: RYDAN GULYAS, male   DOB: 1956/03/06, 61 y.o.   MRN: [id number] Patient ID: GAUDENCIO CHESNUT, male   DOB: 05/06/1956, 61 y.o.   MRN: [id number] Patient ID: ELAN BRAINERD, male   DOB: 01-20-1956, 61 y.o.   MRN: [id number] Patient ID: EYTHAN JAYNE, male   DOB: 1955-11-13, 61 y.o.   MRN: [id number] Patient ID: CHARISTOPHER RUMBLE, male   DOB: 19-Oct-1955, 61 y.o.   MRN: [id number] Patient ID: YAMAN GRAUBERGER, male   DOB: 10/02/1955, 61 y.o.   MRN: [id number] Patient ID: EHSAN CORVIN, male   DOB: 08-30-1955, 61 y.o.   MRN: [id number] Patient ID: SUMIT BRANHAM, male   DOB: 1955/11/03, 61 y.o.   MRN: [id number] Patient ID: JAVID KEMLER, male   DOB: Jan 18, 1956, 61 y.o.   MRN: [id number] Patient ID: KRISTAIN HU, male   DOB: [birth year], 61 y.o.   MRN: [id number] Patient ID: LUISFERNANDO BRIGHTWELL, male   DOB: 12-24-1955, 61 y.o.   MRN: [id number] Patient ID: PERKINS MOLINA, male   DOB: [birth year], 61 y.o.   MRN: [id number] Patient ID: CALYN RUBI, male   DOB: 1955-11-17, 61 y.o.   MRN: [id number] Patient ID: LETRELL ATTWOOD, male   DOB: 02-Jun-1956, 61 y.o.   MRN: [id number] [facility] [address] Progress Note KIER SMEAD MRN: [id number] DOB: 07/26/1956 Age: 61 y.o.  Date: 03/25/2017   Chief Complaint: No chief complaint on file.  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 He has one son age 80 years old and lives in [place]  The patient states he has a history depression that started around 2009. Prior to that year he been treated with ribavirin to treat hepatitis C which made him very depressed. He's also had a number of deaths in his family-both sisters have died as well as both parents in the last several years. His son is very close to him and he only talks to him periodically.  Currently the patient is struggling with medical problems  including neck and back pain. He recently had a neurostimulator installed in his back which is helped to some degree. Lately he's been a bit more depressed because his aunt keeps calling and wanting to talk about all the dead relatives. He's not been seeing his counselor regularly and I think it would be helpful for him to get back into counseling. He does have friends and he walks his dog 2 miles a day. He admits to passive suicidal ideation but would never hurt himself.  The patient returns for followup after 2 months. Last week he came in claiming that he lost his Klonopin and then claiming that he threw it away. He was able to get it filled on August 14 and states now he feels a little bit better. He also claims that his pain management specialist put him on Cymbalta and he ran out and he was having "withdrawal" he claims he still not sleeping well and is nervous about starting a temporary job. He had also stopped the Prozac because he was put on Cymbalta but feels like he did better on it. He still seems rather drowsy and out of it. The pain specialist nurse practitioner also put him on hydrocodone and he really has no business being on this since he's had an addiction problem  to opiates in the past. I explained this to him and he agreed to sign a release so I can speak to his pain management specialist  Past psychiatric history Patient has history of depression for many years. He has taken in the past Wellbutrin Cymbalta and Celexa. He denies any history of suicidal attempt.  Alcohol and substance use history Patient has significant history of alcohol with multiple rehabilitation and detox treatment. He has history of DWI. He also had history of taking recreational drugs and he was in the [employer]. He has history of intravenous drug use that cause hepatitis C. Patient also had history of using pain medication.  Allergies: Allergies  Allergen Reactions  . Levofloxacin Nausea Only and Other (See  Comments)    "Creepy" feeling, skin didn't feel right, sort of itchy.  . Ciprofloxacin     Other reaction(s): Sweating (intolerance)  . Sulfa Antibiotics    Medical History: Past Medical History:  Diagnosis Date  . Allergy   . Anxiety   . Arthritis   . Back problem   . Depression   . GERD (gastroesophageal reflux disease)   . Hepatitis C 1979  . Hepatitis C reactive    LIVER BX 2008-CHRONIC ACTIVE HEPATITIS  . HTN (hypertension)   . Hx of adenomatous colonic polyps 2008   due for surveillance 2017  . Hypercholesterolemia   . IBS (irritable bowel syndrome)   . Knee pain   . Leukopenia 11/06/2015  . Normal cardiac stress test 05/25/2011   [facility] [place]  . Normal echocardiogram 05/25/2011   EF 55%, mild TR  . Substance abuse    narcotic addiction  . Thrombocytopenia ([place]) 11/06/2015  Cervical fusion Neck surgery Neck pain Hepatitis C Hypertension Benign tremors High blood pressure He sees Dr. Moshe Cipro and Dr. Paulita Cradle for pain management.  Surgical History: Past Surgical History:  Procedure Laterality Date  . APPENDECTOMY    . CARPAL TUNNEL RELEASE     rt  . COLONOSCOPY  2008 SLF ARS D100 V8 PHEN 12.5   2 SIMPLE ADENOMAS (< 1 CM)  . ESOPHAGOGASTRODUODENOSCOPY  04/26/2012   Dr. Oneida Alar: Non-erosive gastritis (inflammation) was found in the gastric antrum but no H.pylori; multiple biopsies (duodenal bx negative for Celiac)/ The mucosa of the esophagus appeared normal  . EUS  09/08/2012   Dr. Ardis Hughs: CBD dilated but no stones. Query secondary to Sphincter of Oddi stenosis, ?dysfunction, but clinically without symptoms  . HARDWARE REMOVAL Right 02/12/2014   Procedure: HARDWARE REMOVAL;  Surgeon: Jolyn Nap, MD;  Location: [place];  Service: Orthopedics;  Laterality: Right;  . KNEE ARTHROSCOPY    . Neurostimulator implant    . right ring finger    . spinal stenosis, had screws put in neck  04/11/2009   DR KRITZER  . TONSILLECTOMY    .  ULNA OSTEOTOMY Right 02/12/2014   Procedure: RIGHT ULNAR SHORTENING AND OSTEOTOMY ;  Surgeon: Jolyn Nap, MD;  Location: [place];  Service: Orthopedics;  Laterality: Right;  . ULNAR NERVE TRANSPOSITION    . UPPER GASTROINTESTINAL ENDOSCOPY  2008 SLF ABD PAIN WEIGHT LOSS d100 v8 phen 12.5   NL  . WRIST SURGERY Right jan 2015   Dr. Edmonia Lynch   Family History: family history includes ADD / ADHD in his cousin; Alcohol abuse in his cousin, father, and paternal uncle; Anxiety disorder in his cousin; Bipolar disorder in his cousin; Cancer in his mother and sister; Colon cancer in his  maternal grandfather; Dementia in his paternal aunt, paternal grandmother, and paternal uncle; Depression in his cousin; Emotional abuse in his sister and sister; Heart defect in his unknown relative; Heart failure in his father; OCD in his cousin; Paranoid behavior in his cousin; Stroke in his father and sister. Reviewed again in the office and no changes were noted except the new pain issues  Psychosocial history Patient was born and raised in [place].  He married twice. Patient served in the [employer] but got honorable discharge  Patient has multiple family member who has died in past few years. His father died in Dec 05, 2007 and mother died in 2008/12/04  and 2 of his sister also died.  Patient lives by himself.  Mental status examination Patient is casually dressed and groomed. He is limping .He maintained fair eye contact. He described his mood as fairly somewhat down and his affect is congruent His speech is slow but clear. His thought process is slow but logical linear and goal-directed. Marland Kitchen  He denies any active or passive suicidal thinking or homicidal thinking. He denies any auditory or visual hallucination. He is sluggish appearing and oriented x3.  His attention and concentration is distracted. There were no paranoia or delusions present at this time. His insight judgment and impulse control is  okay. Review of systems is positive for chronic back pain and pain in his left hand Diagnosis Axis I Maj. depressive disorder, polysubstance dependence by history Axis II deferred Axis III see medical history Axis IV mild to moderate Axis V 65-70  Plan/Discussion: I took his vitals.  I reviewed CC, tobacco/med/surg Hx, meds effects/ side effects, problem list, therapies and responses as well as current situation/symptoms discussed options. He will continue clonazepam for anxiety,  Seroquel for mood stabilization, Neurontin for anxiety. He'll restart Prozac 20 mg twice a day. He'll  return in 4 weeks and I will speak to his pain management specialist  See orders and pt instructions for more details.  Medical Decision Making Problem Points:  Established problem, stable/improving (1), Review of last therapy session (1) and Review of psycho-social stressors (1) Data Points:  Review or order clinical lab tests (1) Review of medication regiment & side effects (2)  I certify that outpatient services furnished can reasonably be expected to improve the patient's condition.   [name], MDPatient ID: Morene Antu, male   DOB: [birth year], 61 y.o.   MRN: [id number]

## 2017-03-25 NOTE — Progress Notes (Signed)
Marc [name]     MRN: [id number]      DOB: 10/02/1955   HPI Marc [name] is here for follow up and re-evaluation of chronic medical conditions, medication management and review of any available recent lab and radiology data.  Preventive health is updated, specifically  Cancer screening and Immunization.   States he has not been doing very well. Reports that h "lost " his medication while cleaning his apartment because he essentially "had no choice"States he has been having withdrawals from pain medication, and psychiatric meds which is very unpleasant, recalls and incident just over 1 week ago when he "foud himself" wandering in the street in the early hrs of the morning, and he  has no recall of how he either got out of his home or how he got home between 2 am and 4 am C/o pain and bruising of anterior chest and right side of chest after terrifying recurrent dreams over this period when he rolled off his bed, still has significant bruise on right chest, painful to breathe    ROS Denies recent fever or chills. Denies sinus pressure, nasal congestion, ear pain or sore throat. Denies chest congestion, productive cough or wheezing. Denies  palpitations and leg swelling Denies abdominal pain, nausea, vomiting,diarrhea or constipation.   Denies dysuria, frequency, hesitancy or incontinence. . Denies skin break down or rash.   PE  BP 120/80   Pulse 75   Resp 16   Ht 5\' 10"  (1.778 m)   Wt 169 lb (76.7 kg)   SpO2 98%   BMI 24.25 kg/m   Patient alert and oriented and in no cardiopulmonary distress.  HEENT: No facial asymmetry, EOMI,   oropharynx pink and moist.  Neck supple no JVD, no mass.  Chest: Clear to auscultation bilaterally.Bruising over lower one third of right chest  CVS: S1, S2 no murmurs, no S3.Regular rate.  ABD: Soft non tender.   Ext: No edema  MS: Adequate ROM spine, shoulders, hips and knees.  Skin: Intact, no ulcerations or rash noted.  Psych: Good eye  contact, normal affect. Memory intact not anxious or depressed appearing.  CNS: CN 2-12 intact, power,  normal throughout.no focal deficits noted.   Assessment & Plan  Chest wall pain Bruising  And pain of chest wall s/p fall, will obtain CXR to determine extent of underling bony injury, clnical impression is of soft tissue injury only, pt to use tylenol for pain and is encouraged to breathe deeply to reduce small airway  collapse and infection  Anxiety and depression Currently uncontrolled due to forced drug holiday as pt reports "losing medication for the last 10 days,he is to re establish with therapist as he appears to be decompensating curently  Essential hypertension Controlled, no change in medication DASH diet and commitment to daily physical activity for a minimum of 30 minutes discussed and encouraged, as a part of hypertension management. The importance of attaining a healthy weight is also discussed.  BP/Weight 03/25/2017 01/13/2017 10/20/2016 10/02/2016 10/01/2016 09/08/2016 17/11/9447  Systolic BP 675 916 384 665 993 570 177  Diastolic BP 80 82 80 78 76 94 68  Wt. (Lbs) 169 175.08 177 175 176 165 173.08  BMI 24.25 25.12 25.4 25.11 25.25 23.68 23.47  Some encounter information is confidential and restricted. Go to Review Flowsheets activity to see all data.       Chronic pain syndrome Managed by pain specialist, complicated by history of narcotic addiction, pt to use tylenol for current  acute chest wall pain s/p recent trauma

## 2017-03-28 DIAGNOSIS — R0789 Other chest pain: Secondary | ICD-10-CM

## 2017-03-28 HISTORY — DX: Other chest pain: R07.89

## 2017-03-28 NOTE — Assessment & Plan Note (Signed)
Managed by pain specialist, complicated by history of narcotic addiction, pt to use tylenol for current acute chest wall pain s/p recent trauma

## 2017-03-28 NOTE — Assessment & Plan Note (Addendum)
Bruising  And pain of chest wall s/p fall, will obtain CXR to determine extent of underling bony injury, clnical impression is of soft tissue injury only, pt to use tylenol for pain and is encouraged to breathe deeply to reduce small airway  collapse and infection

## 2017-03-28 NOTE — Assessment & Plan Note (Signed)
Controlled, no change in medication DASH diet and commitment to daily physical activity for a minimum of 30 minutes discussed and encouraged, as a part of hypertension management. The importance of attaining a healthy weight is also discussed.  BP/Weight 03/25/2017 01/13/2017 10/20/2016 10/02/2016 10/01/2016 09/08/2016 05/12/5851  Systolic BP 778 242 353 614 431 540 086  Diastolic BP 80 82 80 78 76 94 68  Wt. (Lbs) 169 175.08 177 175 176 165 173.08  BMI 24.25 25.12 25.4 25.11 25.25 23.68 23.47  Some encounter information is confidential and restricted. Go to Review Flowsheets activity to see all data.

## 2017-03-28 NOTE — Assessment & Plan Note (Signed)
Currently uncontrolled due to forced drug holiday as pt reports "losing medication for the last 10 days,he is to re establish with therapist as he appears to be decompensating curently

## 2017-03-31 ENCOUNTER — Encounter (HOSPITAL_COMMUNITY): Payer: Medicare HMO | Attending: Oncology | Admitting: Oncology

## 2017-03-31 ENCOUNTER — Encounter (HOSPITAL_COMMUNITY): Payer: Medicare HMO

## 2017-03-31 ENCOUNTER — Encounter (HOSPITAL_COMMUNITY): Payer: Self-pay | Admitting: Oncology

## 2017-03-31 VITALS — BP 132/86 | HR 69 | Resp 16 | Ht 70.0 in | Wt 171.4 lb

## 2017-03-31 DIAGNOSIS — R7989 Other specified abnormal findings of blood chemistry: Secondary | ICD-10-CM

## 2017-03-31 DIAGNOSIS — D696 Thrombocytopenia, unspecified: Secondary | ICD-10-CM | POA: Diagnosis not present

## 2017-03-31 DIAGNOSIS — M5116 Intervertebral disc disorders with radiculopathy, lumbar region: Secondary | ICD-10-CM | POA: Diagnosis not present

## 2017-03-31 DIAGNOSIS — M545 Low back pain: Secondary | ICD-10-CM | POA: Diagnosis not present

## 2017-03-31 DIAGNOSIS — M79601 Pain in right arm: Secondary | ICD-10-CM | POA: Diagnosis not present

## 2017-03-31 DIAGNOSIS — M549 Dorsalgia, unspecified: Secondary | ICD-10-CM | POA: Diagnosis not present

## 2017-03-31 DIAGNOSIS — K760 Fatty (change of) liver, not elsewhere classified: Secondary | ICD-10-CM

## 2017-03-31 DIAGNOSIS — B1920 Unspecified viral hepatitis C without hepatic coma: Secondary | ICD-10-CM

## 2017-03-31 DIAGNOSIS — M5416 Radiculopathy, lumbar region: Secondary | ICD-10-CM | POA: Diagnosis not present

## 2017-03-31 DIAGNOSIS — M792 Neuralgia and neuritis, unspecified: Secondary | ICD-10-CM | POA: Diagnosis not present

## 2017-03-31 DIAGNOSIS — D708 Other neutropenia: Secondary | ICD-10-CM

## 2017-03-31 DIAGNOSIS — D72819 Decreased white blood cell count, unspecified: Secondary | ICD-10-CM | POA: Diagnosis not present

## 2017-03-31 LAB — BASIC METABOLIC PANEL
Anion gap: 9 (ref 5–15)
BUN: 15 mg/dL (ref 6–20)
CHLORIDE: 100 mmol/L — AB (ref 101–111)
CO2: 27 mmol/L (ref 22–32)
Calcium: 9.6 mg/dL (ref 8.9–10.3)
Creatinine, Ser: 1.14 mg/dL (ref 0.61–1.24)
GFR calc Af Amer: >60 mL/min (ref >60)
GFR calc non Af Amer: >60 mL/min (ref >60)
GLUCOSE: 103 mg/dL — AB (ref 65–99)
POTASSIUM: 3.8 mmol/L (ref 3.5–5.1)
Sodium: 136 mmol/L (ref 135–145)

## 2017-03-31 LAB — CBC WITH DIFFERENTIAL/PLATELET
BASOS PCT: 0 %
Basophils Absolute: 0 10*3/uL (ref 0.0–0.1)
Eosinophils Absolute: 0.1 10*3/uL (ref 0.0–0.7)
Eosinophils Relative: 1 %
HEMATOCRIT: 39 % (ref 39.0–52.0)
HEMOGLOBIN: 13.4 g/dL (ref 13.0–17.0)
LYMPHS ABS: 1.6 10*3/uL (ref 0.7–4.0)
LYMPHS PCT: 31 %
MCH: 31.8 pg (ref 26.0–34.0)
MCHC: 34.4 g/dL (ref 30.0–36.0)
MCV: 92.6 fL (ref 78.0–100.0)
MONO ABS: 0.3 10*3/uL (ref 0.1–1.0)
MONOS PCT: 6 %
NEUTROS ABS: 3.2 10*3/uL (ref 1.7–7.7)
NEUTROS PCT: 62 %
Platelets: 132 10*3/uL — ABNORMAL LOW (ref 150–400)
RBC: 4.21 MIL/uL — ABNORMAL LOW (ref 4.22–5.81)
RDW: 13.7 % (ref 11.5–15.5)
WBC: 5.2 10*3/uL (ref 4.0–10.5)

## 2017-03-31 LAB — IRON AND TIBC
Iron: 94 ug/dL (ref 45–182)
Saturation Ratios: 27 % (ref 17.9–39.5)
TIBC: 349 ug/dL (ref 250–450)
UIBC: 255 ug/dL

## 2017-03-31 LAB — FERRITIN: FERRITIN: 45 ng/mL (ref 24–336)

## 2017-03-31 NOTE — Progress Notes (Signed)
Fayrene Helper, Marc [name], [place] [address]  No diagnosis found.  CURRENT THERAPY: Observation  INTERVAL HISTORY: Marc [name] Hopes 61 y.o. male returns for followup of Leukopenia with thrombocytopenia in the setting of hepatitis C and fatty liver. AND Elevated ferritin/iron saturation  Patient presents today for continued follow up. He stated he had a rough month last month when he was cleaning his house and accidentally threw out all his medications including his cymbalta and klonipin. He had bad nightmares recently and fell off his bed and hit his side on an end table which caused a big bruise on his right side. Otherwise he states he has been doing well. He denies any chest pain, shortness of breath, abdominal pain, nausea, vomiting, diarrhea.  Review of Systems  Constitutional: Negative.  Negative for chills, fever and weight loss.  HENT: Negative.   Eyes: Negative.   Respiratory: Negative.  Negative for cough.   Cardiovascular: Negative.  Negative for chest pain.  Gastrointestinal: Negative.  Negative for blood in stool, constipation, diarrhea, melena, nausea and vomiting.  Genitourinary: Negative.   Musculoskeletal: Negative.   Skin: Negative.   Neurological: Negative.  Negative for weakness.  Endo/Heme/Allergies: Negative.   Psychiatric/Behavioral: Negative.     Past Medical History:  Diagnosis Date  . Allergy   . Anxiety   . Arthritis   . Back problem   . Depression   . GERD (gastroesophageal reflux disease)   . Hepatitis C 1979  . Hepatitis C reactive    LIVER BX 2008-CHRONIC ACTIVE HEPATITIS  . HTN (hypertension)   . Hx of adenomatous colonic polyps 2008   due for surveillance 2017  . Hypercholesterolemia   . IBS (irritable bowel syndrome)   . Knee pain   . Leukopenia 11/06/2015  . Normal cardiac stress test 05/25/2011   [facility] [place]  . Normal echocardiogram 05/25/2011   EF 55%, mild TR  . Substance  abuse    narcotic addiction  . Thrombocytopenia ([place]) 11/06/2015    Past Surgical History:  Procedure Laterality Date  . APPENDECTOMY    . CARPAL TUNNEL RELEASE     rt  . COLONOSCOPY  2008 SLF ARS D100 V8 PHEN 12.5   2 SIMPLE ADENOMAS (< 1 CM)  . ESOPHAGOGASTRODUODENOSCOPY  04/26/2012   Dr. Oneida Alar: Non-erosive gastritis (inflammation) was found in the gastric antrum but no H.pylori; multiple biopsies (duodenal bx negative for Celiac)/ The mucosa of the esophagus appeared normal  . EUS  09/08/2012   Dr. Ardis Hughs: CBD dilated but no stones. Query secondary to Sphincter of Oddi stenosis, ?dysfunction, but clinically without symptoms  . HARDWARE REMOVAL Right 02/12/2014   Procedure: HARDWARE REMOVAL;  Surgeon: Jolyn Nap, MD;  Location: [place];  Service: Orthopedics;  Laterality: Right;  . KNEE ARTHROSCOPY    . Neurostimulator implant    . right ring finger    . spinal stenosis, had screws put in neck  04/11/2009   DR KRITZER  . TONSILLECTOMY    . ULNA OSTEOTOMY Right 02/12/2014   Procedure: RIGHT ULNAR SHORTENING AND OSTEOTOMY ;  Surgeon: Jolyn Nap, MD;  Location: [place];  Service: Orthopedics;  Laterality: Right;  . ULNAR NERVE TRANSPOSITION    . UPPER GASTROINTESTINAL ENDOSCOPY  2008 SLF ABD PAIN WEIGHT LOSS d100 v8 phen 12.5   NL  . WRIST SURGERY Right jan 2015   Dr. Edmonia Lynch  Family History  Problem Relation Age of Onset  . Cancer Mother        BLADDER  . Cancer Sister        METS  . Emotional abuse Sister   . Stroke Sister   . Emotional abuse Sister   . Heart failure Father   . Stroke Father   . Alcohol abuse Father   . Dementia Paternal Aunt   . Alcohol abuse Paternal Uncle   . Dementia Paternal Uncle   . Dementia Paternal Grandmother   . ADD / ADHD Cousin   . Bipolar disorder Cousin   . Anxiety disorder Cousin   . Depression Cousin        Committed suicide  . Alcohol abuse Cousin   . OCD Cousin   . Paranoid  behavior Cousin   . Colon cancer Maternal Grandfather   . Heart defect Unknown        FAMILY HX  . Colon polyps Neg Hx   . Drug abuse Neg Hx   . Schizophrenia Neg Hx   . Seizures Neg Hx   . Sexual abuse Neg Hx   . Physical abuse Neg Hx     Social History   Social History  . Marital status: Divorced    Spouse name: N/A  . Number of children: N/A  . Years of education: N/A   Social History Main Topics  . Smoking status: Former Smoker    Packs/day: 1.50    Years: 8.00    Types: Cigarettes    Quit date: 08/13/1986  . Smokeless tobacco: Never Used     Comment: quit 20 + yrs ago  . Alcohol use No  . Drug use: No  . Sexual activity: No   Other Topics Concern  . Not on file   Social History Narrative  . No narrative on file     PHYSICAL EXAMINATION  ECOG PERFORMANCE STATUS: 0 - Asymptomatic  Vitals:   03/31/17 1447  BP: 132/86  Pulse: 69  Resp: 16  SpO2: 98%    Constitutional: Well-developed, well-nourished, and in no distress.   HENT:  Head: Normocephalic and atraumatic.  Mouth/Throat: No oropharyngeal exudate. Mucosa moist. Eyes: Pupils are equal, round, and reactive to light. Conjunctivae are normal. No scleral icterus.  Neck: Normal range of motion. Neck supple. No JVD present.  Cardiovascular: Normal rate, regular rhythm and normal heart sounds.  Exam reveals no gallop and no friction rub.   No murmur heard. Pulmonary/Chest: Effort normal and breath sounds normal. No respiratory distress. No wheezes.No rales.  Abdominal: Soft. Bowel sounds are normal. No distension. There is no tenderness. There is no guarding.  Musculoskeletal: No edema or tenderness.  Lymphadenopathy:    No cervical or supraclavicular adenopathy.  Neurological: Alert and oriented to person, place, and time. No cranial nerve deficit.  Skin: Skin is warm and dry. No rash noted. No erythema. No pallor. 5 cm ecchymosis on his right lateral side which is healing. Psychiatric: Affect and  judgment normal.   LABORATORY DATA: CBC    Component Value Date/Time   WBC 5.2 03/31/2017 1408   RBC 4.21 (L) 03/31/2017 1408   HGB 13.4 03/31/2017 1408   HCT 39.0 03/31/2017 1408   PLT 132 (L) 03/31/2017 1408   MCV 92.6 03/31/2017 1408   MCH 31.8 03/31/2017 1408   MCHC 34.4 03/31/2017 1408   RDW 13.7 03/31/2017 1408   LYMPHSABS 1.6 03/31/2017 1408   MONOABS 0.3 03/31/2017 1408   EOSABS 0.1 03/31/2017  1408   BASOSABS 0.0 03/31/2017 1408      Chemistry      Component Value Date/Time   NA 138 06/04/2016 1244   K 4.1 06/04/2016 1244   CL 104 06/04/2016 1244   CO2 24 06/04/2016 1244   BUN 15 06/04/2016 1244   CREATININE 1.05 06/04/2016 1244      Component Value Date/Time   CALCIUM 9.5 06/04/2016 1244   ALKPHOS 67 06/04/2016 1244   AST 56 (H) 06/04/2016 1244   ALT 57 (H) 06/04/2016 1244   BILITOT 0.6 06/04/2016 1244      Lab Results  Component Value Date   IRON 114 10/01/2016   TIBC 400 10/01/2016   FERRITIN 58 10/01/2016     PENDING LABS:   RADIOGRAPHIC STUDIES:  Dg Chest 2 View  Result Date: 03/25/2017 CLINICAL DATA:  Fall. EXAM: CHEST  2 VIEW COMPARISON:  06/15/2014. FINDINGS: Small density noted over the right upper chest most likely outside the patient. Mediastinum and hilar structures normal. Lungs are clear. Heart size normal. No pleural effusion or pneumothorax. Neurostimulator noted over the spot thoracic spinal canal. IMPRESSION: No acute cardiopulmonary disease. Electronically Signed   By: Marcello Moores  Register   On: 03/25/2017 16:29     PATHOLOGY:    ASSESSMENT AND PLAN:  Leukopenia with thrombocytopenia in the setting of hepatitis C and fatty liver. Hepatitis C previously treated with peg-interferon and ribavirin and then harvoni. AND Elevated ferritin/iron saturation.  PLAN: -Reviewed his leukopenia and thrombocytopenia has improved.  -Iron studies pending, will follow up. -Continue observation.  -RTC in 6 months for follow up with  labs.    THERAPY PLAN:  Ongoing observation  All questions were answered. The patient knows to call the clinic with any problems, questions or concerns. We can certainly see the patient much sooner if necessary.   This note is electronically signed by: Twana First, MD 03/31/2017 2:38 PM

## 2017-04-08 ENCOUNTER — Ambulatory Visit (HOSPITAL_COMMUNITY): Payer: Self-pay | Admitting: Psychiatry

## 2017-04-09 ENCOUNTER — Encounter (HOSPITAL_COMMUNITY): Payer: Self-pay | Admitting: Psychiatry

## 2017-04-09 ENCOUNTER — Ambulatory Visit (INDEPENDENT_AMBULATORY_CARE_PROVIDER_SITE_OTHER): Payer: Medicare HMO | Admitting: Psychiatry

## 2017-04-09 DIAGNOSIS — F331 Major depressive disorder, recurrent, moderate: Secondary | ICD-10-CM | POA: Diagnosis not present

## 2017-04-09 NOTE — Progress Notes (Signed)
       THERAPIST PROGRESS NOTE  Session Time: Friday 04/09/2017 11:15 AM -11:51 AM    Participation Level: Active  Behavioral Response: CasualAlert/ Anxious, Depressed  Type of Therapy: Individual Therapy  Treatment Goals addressed:    1. Learn and implement calming sills to manage anxiety response to trigger of trauma.      2. Participate in cognitive to help identify, challenge, and replace biased, negative, and self-defeating thoughts resulting from trauma  Interventions: CBT and Supportive  Summary: Marc [name] is a 61 y.o. male who initially was referred by Dr. Adele Schilder to improve coping and social skills as patient was experiencing sadness, depression, grief, and anger issues. The patient has a history of recurrent periods of depression along with a history of chemical dependency. He has had psychiatric hospitalization due to suicidal thoughts and has participated in inpatient treatment as well as outpatient treatment for chemical dependency. He completed the chemical dependency IOP program in [place] in December 2012.   Patient last was seen in December 2016. He reports scheduling appointment today upon recommendation from PCP Dr. Tula Nakayama. Per patient's report, he experienced an event about 3-4 weeks ago in which she cannot recall a significant amount of time. He says he was talking on the phone with someone and began having no recollection of what occurred next. He says he found himself walking on the road at about 4 AM. He reports no further incidents have occurred. He reports he had nightmares for about 4 days after the incident and possibly experienced flashbacks. He expresses concern that this could possibly have been a symptom of withdrawal from Cymbalta. Per patient's report, he had mistakenly thrown away all of his medication.  Patient reports he also was stressed during that time as he feared he was going to be evicted from his apartment. Patient reports positive  social involvement and says he has a temporary job. He denies suicidal ideations.   Suicidal/Homicidal: No  Therapist Response: Reestablished rapport, facilitated expression of thoughts and feelings regarding recent issues in patient's life, discuss possible connection between stress, trauma history, and lack of recollection of time/events, encouraged patient to discuss concerns about withdrawal from Cymbalta with psychiatrist Dr. Harrington Challenger,   Plan: Return again in 2-3 weeks.  Diagnosis: Axis I: MDD, Recurrent, Moderate    Axis II: No diagnosis    Hanin Decook, LCSW 04/09/2017

## 2017-04-13 ENCOUNTER — Other Ambulatory Visit: Payer: Self-pay | Admitting: Family Medicine

## 2017-04-13 ENCOUNTER — Other Ambulatory Visit: Payer: Self-pay | Admitting: *Deleted

## 2017-04-13 ENCOUNTER — Other Ambulatory Visit (HOSPITAL_COMMUNITY): Payer: Self-pay | Admitting: Psychiatry

## 2017-04-13 MED ORDER — CYCLOBENZAPRINE HCL 10 MG PO TABS: ORAL_TABLET | ORAL | 3 refills | Status: DC

## 2017-04-23 ENCOUNTER — Encounter (HOSPITAL_COMMUNITY): Payer: Self-pay | Admitting: Psychiatry

## 2017-04-23 ENCOUNTER — Ambulatory Visit (INDEPENDENT_AMBULATORY_CARE_PROVIDER_SITE_OTHER): Payer: Medicare HMO | Admitting: Psychiatry

## 2017-04-23 VITALS — BP 134/76 | Wt 170.6 lb

## 2017-04-23 DIAGNOSIS — B1920 Unspecified viral hepatitis C without hepatic coma: Secondary | ICD-10-CM

## 2017-04-23 DIAGNOSIS — M542 Cervicalgia: Secondary | ICD-10-CM

## 2017-04-23 DIAGNOSIS — M549 Dorsalgia, unspecified: Secondary | ICD-10-CM

## 2017-04-23 DIAGNOSIS — F331 Major depressive disorder, recurrent, moderate: Secondary | ICD-10-CM | POA: Diagnosis not present

## 2017-04-23 DIAGNOSIS — Z811 Family history of alcohol abuse and dependence: Secondary | ICD-10-CM | POA: Diagnosis not present

## 2017-04-23 DIAGNOSIS — Z810 Family history of intellectual disabilities: Secondary | ICD-10-CM | POA: Diagnosis not present

## 2017-04-23 DIAGNOSIS — Z818 Family history of other mental and behavioral disorders: Secondary | ICD-10-CM | POA: Diagnosis not present

## 2017-04-23 MED ORDER — CLONAZEPAM 0.5 MG PO TABS: 0.5000 mg | ORAL_TABLET | Freq: Three times a day (TID) | ORAL | 2 refills | Status: DC | PRN

## 2017-04-23 MED ORDER — GABAPENTIN 400 MG PO CAPS: 400.0000 mg | ORAL_CAPSULE | Freq: Three times a day (TID) | ORAL | 2 refills | Status: DC

## 2017-04-23 MED ORDER — QUETIAPINE FUMARATE 50 MG PO TABS: ORAL_TABLET | ORAL | 2 refills | Status: DC

## 2017-04-23 MED ORDER — FLUOXETINE HCL 20 MG PO CAPS: 20.0000 mg | ORAL_CAPSULE | Freq: Two times a day (BID) | ORAL | 2 refills | Status: DC

## 2017-04-23 NOTE — Progress Notes (Signed)
Patient ID: Marc [name], male   DOB: 1956-04-12, 61 y.o.   MRN: [id number] Patient ID: Marc [name], male   DOB: 1956/07/23, 61 y.o.   MRN: [id number] Patient ID: Marc [name], male   DOB: November 25, 1955, 61 y.o.   MRN: [id number] Patient ID: Marc [name], male   DOB: 1955/10/19, 61 y.o.   MRN: [id number] Patient ID: Marc [name], male   DOB: [birth year], 61 y.o.   MRN: [id number] Patient ID: Marc [name], male   DOB: [birth year], 61 y.o.   MRN: [id number] Patient ID: Marc [name], male   DOB: [birth year], 61 y.o.   MRN: [id number] Patient ID: Marc [name], male   DOB: 16-May-1956, 61 y.o.   MRN: [id number] Patient ID: Marc [name], male   DOB: August 25, 1955, 61 y.o.   MRN: [id number] Patient ID: Marc [name], male   DOB: 30-Aug-1955, 61 y.o.   MRN: [id number] Patient ID: Marc [name], male   DOB: September 25, 1955, 61 y.o.   MRN: [id number] Patient ID: Marc [name], male   DOB: [birth year], 61 y.o.   MRN: [id number] Patient ID: Marc [name], male   DOB: Sep 04, 1955, 61 y.o.   MRN: [id number] Patient ID: Marc [name], male   DOB: 1955-12-02, 61 y.o.   MRN: [id number] [facility] [id number] Progress Note Marc [name] MRN: [id number] DOB: 03-12-1956 Age: 61 y.o.  Date: 04/23/2017   Chief Complaint: Chief Complaint  Patient presents with  . Depression  . Anxiety  . 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 He has one son age 10 years old and lives in [place]  The patient states he has a history depression that started around 2009. Prior to that year he been treated with ribavirin to treat hepatitis C which made him very depressed. He's also had a number of deaths in his family-both sisters have died as well as both parents in the last several years. His son is very close to him and he only talks to him  periodically.  Currently the patient is struggling with medical problems including neck and back pain. He recently had a neurostimulator installed in his back which is helped to some degree. Lately he's been a bit more depressed because his aunt keeps calling and wanting to talk about all the dead relatives. He's not been seeing his counselor regularly and I think it would be helpful for him to get back into counseling. He does have friends and he walks his dog 2 miles a day. He admits to passive suicidal ideation but would never hurt himself.  The patient returns for followup after  4 weeks. Last time he had an elaborate story about losing his Klonopin and also going through with draw from Cymbalta. He is back on Prozac and now and has had his clonazepam refilled after waiting for the appropriate date of refill. He states that he is feeling better. He's actually going to move to a house that his friend owns in [place] so he will have more space. He is a little bit stressed about the move. He's also been doing a temporary job. His mood seems to be better and he is much less anxious. He still has some nightmares at night and at times is fallen off his bed and hurt himself but he thinks  this was due to the withdrawal. He's not had one of these episodes in about a week He is now taking Percocet 5 mg twice a day as prescribed by a local pain clinic Past psychiatric history Patient has history of depression for many years. He has taken in the past Wellbutrin Cymbalta and Celexa. He denies any history of suicidal attempt.  Alcohol and substance use history Patient has significant history of alcohol with multiple rehabilitation and detox treatment. He has history of DWI. He also had history of taking recreational drugs and he was in the [employer]. He has history of intravenous drug use that cause hepatitis C. Patient also had history of using pain medication.  Allergies: Allergies  Allergen Reactions  . Levofloxacin  Nausea Only and Other (See Comments)    "Creepy" feeling, skin didn't feel right, sort of itchy.  . Ciprofloxacin     Other reaction(s): Sweating (intolerance)  . Sulfa Antibiotics    Medical History: Past Medical History:  Diagnosis Date  . Allergy   . Anxiety   . Arthritis   . Back problem   . Depression   . GERD (gastroesophageal reflux disease)   . Hepatitis C 1979  . Hepatitis C reactive    LIVER BX 2008-CHRONIC ACTIVE HEPATITIS  . HTN (hypertension)   . Hx of adenomatous colonic polyps 2008   due for surveillance 2017  . Hypercholesterolemia   . IBS (irritable bowel syndrome)   . Knee pain   . Leukopenia 11/06/2015  . Normal cardiac stress test 05/25/2011   [facility] [place]  . Normal echocardiogram 05/25/2011   EF 55%, mild TR  . Substance abuse    narcotic addiction  . Thrombocytopenia ([place]) 11/06/2015  Cervical fusion Neck surgery Neck pain Hepatitis C Hypertension Benign tremors High blood pressure He sees Dr. Moshe Cipro and Dr. Paulita Cradle for pain management.  Surgical History: Past Surgical History:  Procedure Laterality Date  . APPENDECTOMY    . CARPAL TUNNEL RELEASE     rt  . COLONOSCOPY  2008 [facility] ARS D100 V8 PHEN 12.5   2 SIMPLE ADENOMAS (< 1 CM)  . ESOPHAGOGASTRODUODENOSCOPY  04/26/2012   Dr. Oneida Alar: Non-erosive gastritis (inflammation) was found in the gastric antrum but no H.pylori; multiple biopsies (duodenal bx negative for Celiac)/ The mucosa of the esophagus appeared normal  . EUS  09/08/2012   Dr. Ardis Hughs: CBD dilated but no stones. Query secondary to Sphincter of Oddi stenosis, ?dysfunction, but clinically without symptoms  . HARDWARE REMOVAL Right 02/12/2014   Procedure: HARDWARE REMOVAL;  Surgeon: Jolyn Nap, MD;  Location: [facility];  Service: Orthopedics;  Laterality: Right;  . KNEE ARTHROSCOPY    . Neurostimulator implant    . right ring finger    . spinal stenosis, had screws put in neck  04/11/2009   DR  KRITZER  . TONSILLECTOMY    . ULNA OSTEOTOMY Right 02/12/2014   Procedure: RIGHT ULNAR SHORTENING AND OSTEOTOMY ;  Surgeon: Jolyn Nap, MD;  Location: [facility];  Service: Orthopedics;  Laterality: Right;  . ULNAR NERVE TRANSPOSITION    . UPPER GASTROINTESTINAL ENDOSCOPY  2008 [facility]F ABD PAIN WEIGHT LOSS d100 v8 phen 12.5   NL  . WRIST SURGERY Right jan 2015   Dr. Edmonia Lynch   Family History: family history includes ADD / ADHD in his cousin; Alcohol abuse in his cousin, father, and paternal uncle; Anxiety disorder in his cousin; Bipolar disorder in his cousin; Cancer in his  mother and sister; Colon cancer in his maternal grandfather; Dementia in his paternal aunt, paternal grandmother, and paternal uncle; Depression in his cousin; Emotional abuse in his sister and sister; Heart defect in his unknown relative; Heart failure in his father; OCD in his cousin; Paranoid behavior in his cousin; Stroke in his father and sister. Reviewed again in the office and no changes were noted except the new pain issues  Psychosocial history Patient was born and raised in [place].  He married twice. Patient served in the [employer] but got honorable discharge  Patient has multiple family member who has died in past few years. His father died in Nov 15, 2007 and mother died in 11/14/2008  and 2 of his sister also died.  Patient lives by himself.  Mental status examination Patient is casually dressed and groomed. He is limping .He maintained fair eye contact. He described his mood as fairly good and his affect is brighter His speech is slow but clear. His thought process is slow but logical linear and goal-directed. Marland Kitchen  He denies any active or passive suicidal thinking or homicidal thinking. He denies any auditory or visual hallucination. He is sluggish appearing and oriented x3.  His attention and concentration is distracted. There were no paranoia or delusions present at this time. His insight judgment and  impulse control is okay. Review of systems is positive for chronic back pain and pain in his left hand Diagnosis Axis I Maj. depressive disorder, polysubstance dependence by history Axis II deferred Axis III see medical history Axis IV mild to moderate Axis V 65-70  Plan/Discussion: I took his vitals.  I reviewed CC, tobacco/med/surg Hx, meds effects/ side effects, problem list, therapies and responses as well as current situation/symptoms discussed options. He will continue clonazepam for anxiety,  Seroquel for mood stabilization, Neurontin for anxiety. He'll continue Prozac 20 mg twice a day. He'll  return in 2 months See orders and pt instructions for more details.  Medical Decision Making Problem Points:  Established problem, stable/improving (1), Review of last therapy session (1) and Review of psycho-social stressors (1) Data Points:  Review or order clinical lab tests (1) Review of medication regiment & side effects (2)  I certify that outpatient services furnished can reasonably be expected to improve the patient's condition.   [name], MDPatient ID: Marc [name], male   DOB: 27-Aug-1955, 61 y.o.   MRN: [id number]

## 2017-04-28 ENCOUNTER — Telehealth: Payer: Self-pay

## 2017-04-28 NOTE — Telephone Encounter (Signed)
Called pt to see if they wanted to come in today for AWV since we had a cancellation at 3:30.    Josepha Pigg, B.A.  Care Guide [phone]

## 2017-04-29 ENCOUNTER — Other Ambulatory Visit: Payer: Self-pay | Admitting: Family Medicine

## 2017-04-29 NOTE — Telephone Encounter (Signed)
Seen [phone]

## 2017-05-05 ENCOUNTER — Ambulatory Visit (INDEPENDENT_AMBULATORY_CARE_PROVIDER_SITE_OTHER): Payer: Medicare HMO

## 2017-05-05 VITALS — BP 80/54 | HR 72 | Temp 98.3°F | Ht 70.0 in | Wt 172.0 lb

## 2017-05-05 DIAGNOSIS — M79601 Pain in right arm: Secondary | ICD-10-CM | POA: Diagnosis not present

## 2017-05-05 DIAGNOSIS — F329 Major depressive disorder, single episode, unspecified: Secondary | ICD-10-CM | POA: Diagnosis not present

## 2017-05-05 DIAGNOSIS — M545 Low back pain: Secondary | ICD-10-CM | POA: Diagnosis not present

## 2017-05-05 DIAGNOSIS — M79609 Pain in unspecified limb: Secondary | ICD-10-CM | POA: Diagnosis not present

## 2017-05-05 DIAGNOSIS — Z0000 Encounter for general adult medical examination without abnormal findings: Secondary | ICD-10-CM | POA: Diagnosis not present

## 2017-05-05 DIAGNOSIS — M5116 Intervertebral disc disorders with radiculopathy, lumbar region: Secondary | ICD-10-CM | POA: Diagnosis not present

## 2017-05-05 DIAGNOSIS — Z79891 Long term (current) use of opiate analgesic: Secondary | ICD-10-CM | POA: Diagnosis not present

## 2017-05-05 DIAGNOSIS — M549 Dorsalgia, unspecified: Secondary | ICD-10-CM | POA: Diagnosis not present

## 2017-05-05 NOTE — Patient Instructions (Signed)
Marc [name] , Thank you for taking time to come for your Medicare Wellness Visit. I appreciate your ongoing commitment to your health goals. Please review the following plan we discussed and let me know if I can assist you in the future.   Screening recommendations/referrals: Colonoscopy: Due Recommended yearly ophthalmology/optometry visit for glaucoma screening and checkup Recommended yearly dental visit for hygiene and checkup  Vaccinations: Influenza vaccine: Due, due to your egg allergy we recommend you go to your local pharmacy and have them administer the egg free vaccine.  Pneumococcal vaccine: Due at age 63 Tdap vaccine: Up to date, next due 12/2021 Shingles vaccine: Completed     Advanced directives: Advance directive discussed with you today. I have provided a copy for you to complete at home and have notarized. Once this is complete please bring a copy in to our office so we can scan it into your chart.  Conditions/risks identified: Low blood pressure today. If you develop any dizziness, lightheadedness, blurred vision, clammy skin, please call 911 or report to the ER immediately.   Next appointment: Follow up on 06/15/2017 at 1:00 pm. Follow up in 1 year for your annual wellness visit.  Preventive Care 40-64 Years, Male Preventive care refers to lifestyle choices and visits with your health care provider that can promote health and wellness. What does preventive care include?  A yearly physical exam. This is also called an annual well check.  Dental exams once or twice a year.  Routine eye exams. Ask your health care provider how often you should have your eyes checked.  Personal lifestyle choices, including:  Daily care of your teeth and gums.  Regular physical activity.  Eating a healthy diet.  Avoiding tobacco and drug use.  Limiting alcohol use.  Practicing safe sex.  Taking low-dose aspirin every day starting at age 69. What happens during an annual well  check? The services and screenings done by your health care provider during your annual well check will depend on your age, overall health, lifestyle risk factors, and family history of disease. Counseling  Your health care provider may ask you questions about your:  Alcohol use.  Tobacco use.  Drug use.  Emotional well-being.  Home and relationship well-being.  Sexual activity.  Eating habits.  Work and work Statistician. Screening  You may have the following tests or measurements:  Height, weight, and BMI.  Blood pressure.  Lipid and cholesterol levels. These may be checked every 5 years, or more frequently if you are over 82 years old.  Skin check.  Lung cancer screening. You may have this screening every year starting at age 4 if you have a 30-pack-year history of smoking and currently smoke or have quit within the past 15 years.  Fecal occult blood test (FOBT) of the stool. You may have this test every year starting at age 12.  Flexible sigmoidoscopy or colonoscopy. You may have a sigmoidoscopy every 5 years or a colonoscopy every 10 years starting at age 65.  Prostate cancer screening. Recommendations will vary depending on your family history and other risks.  Hepatitis C blood test.  Hepatitis B blood test.  Sexually transmitted disease (STD) testing.  Diabetes screening. This is done by checking your blood sugar (glucose) after you have not eaten for a while (fasting). You may have this done every 1-3 years. Discuss your test results, treatment options, and if necessary, the need for more tests with your health care provider. Vaccines  Your health care provider  may recommend certain vaccines, such as:  Influenza vaccine. This is recommended every year.  Tetanus, diphtheria, and acellular pertussis (Tdap, Td) vaccine. You may need a Td booster every 10 years.  Zoster vaccine. You may need this after age 55.  Pneumococcal 13-valent conjugate (PCV13)  vaccine. You may need this if you have certain conditions and have not been vaccinated.  Pneumococcal polysaccharide (PPSV23) vaccine. You may need one or two doses if you smoke cigarettes or if you have certain conditions. Talk to your health care provider about which screenings and vaccines you need and how often you need them. This information is not intended to replace advice given to you by your health care provider. Make sure you discuss any questions you have with your health care provider. Document Released: 08/23/2015 Document Revised: 04/15/2016 Document Reviewed: 05/28/2015 Elsevier Interactive Patient Education  2017 [place] Prevention in the Home Falls can cause injuries. They can happen to people of all ages. There are many things you can do to make your home safe and to help prevent falls. What can I do on the outside of my home?  Regularly fix the edges of walkways and driveways and fix any cracks.  Remove anything that might make you trip as you walk through a door, such as a raised step or threshold.  Trim any bushes or trees on the path to your home.  Use bright outdoor lighting.  Clear any walking paths of anything that might make someone trip, such as rocks or tools.  Regularly check to see if handrails are loose or broken. Make sure that both sides of any steps have handrails.  Any raised decks and porches should have guardrails on the edges.  Have any leaves, snow, or ice cleared regularly.  Use sand or salt on walking paths during winter.  Clean up any spills in your garage right away. This includes oil or grease spills. What can I do in the bathroom?  Use night lights.  Install grab bars by the toilet and in the tub and shower. Do not use towel bars as grab bars.  Use non-skid mats or decals in the tub or shower.  If you need to sit down in the shower, use a plastic, non-slip stool.  Keep the floor dry. Clean up any water that spills on  the floor as soon as it happens.  Remove soap buildup in the tub or shower regularly.  Attach bath mats securely with double-sided non-slip rug tape.  Do not have throw rugs and other things on the floor that can make you trip. What can I do in the bedroom?  Use night lights.  Make sure that you have a light by your bed that is easy to reach.  Do not use any sheets or blankets that are too big for your bed. They should not hang down onto the floor.  Have a firm chair that has side arms. You can use this for support while you get dressed.  Do not have throw rugs and other things on the floor that can make you trip. What can I do in the kitchen?  Clean up any spills right away.  Avoid walking on wet floors.  Keep items that you use a lot in easy-to-reach places.  If you need to reach something above you, use a strong step stool that has a grab bar.  Keep electrical cords out of the way.  Do not use floor polish or wax that makes  floors slippery. If you must use wax, use non-skid floor wax.  Do not have throw rugs and other things on the floor that can make you trip. What can I do with my stairs?  Do not leave any items on the stairs.  Make sure that there are handrails on both sides of the stairs and use them. Fix handrails that are broken or loose. Make sure that handrails are as long as the stairways.  Check any carpeting to make sure that it is firmly attached to the stairs. Fix any carpet that is loose or worn.  Avoid having throw rugs at the top or bottom of the stairs. If you do have throw rugs, attach them to the floor with carpet tape.  Make sure that you have a light switch at the top of the stairs and the bottom of the stairs. If you do not have them, ask someone to add them for you. What else can I do to help prevent falls?  Wear shoes that:  Do not have high heels.  Have rubber bottoms.  Are comfortable and fit you well.  Are closed at the toe. Do not  wear sandals.  If you use a stepladder:  Make sure that it is fully opened. Do not climb a closed stepladder.  Make sure that both sides of the stepladder are locked into place.  Ask someone to hold it for you, if possible.  Clearly mark and make sure that you can see:  Any grab bars or handrails.  First and last steps.  Where the edge of each step is.  Use tools that help you move around (mobility aids) if they are needed. These include:  Canes.  Walkers.  Scooters.  Crutches.  Turn on the lights when you go into a dark area. Replace any light bulbs as soon as they burn out.  Set up your furniture so you have a clear path. Avoid moving your furniture around.  If any of your floors are uneven, fix them.  If there are any pets around you, be aware of where they are.  Review your medicines with your doctor. Some medicines can make you feel dizzy. This can increase your chance of falling. Ask your doctor what other things that you can do to help prevent falls. This information is not intended to replace advice given to you by your health care provider. Make sure you discuss any questions you have with your health care provider. Document Released: 05/23/2009 Document Revised: 01/02/2016 Document Reviewed: 08/31/2014 Elsevier Interactive Patient Education  2017 [employer].

## 2017-05-05 NOTE — Progress Notes (Signed)
Subjective:   Marc [name] is a 61 y.o. male who presents for Medicare Annual/Subsequent preventive examination.  BP was low today however, patient was a-sypmtomatic. No c/o dizziness, blurred vision, lightheadedness, or nausea. Dr. Mannie Stabile was notified and patient educated should he start to develop symptoms to immediately call 911.    Review of Systems: Cardiac Risk Factors include: advanced age (>48men, >35 women);hypertension;male gender     Objective:    Vitals: BP (!) 80/54 Comment: Patient is asymptomatic, Dr. Mannie Stabile is aware  Pulse 72   Temp 98.3 F (36.8 C) (Temporal)   Ht 5\' 10"  (1.778 m)   Wt 172 lb 0.6 oz (78 kg)   BMI 24.69 kg/m   Body mass index is 24.69 kg/m.  Tobacco History  Smoking Status  . Former Smoker  . Packs/day: 1.50  . Years: 8.00  . Types: Cigarettes  . Quit date: 08/13/1986  Smokeless Tobacco  . Never Used    Comment: quit 20 + yrs ago     Counseling given: Not Answered   Past Medical History:  Diagnosis Date  . Allergy   . Anxiety   . Arthritis   . Back problem   . Depression   . GERD (gastroesophageal reflux disease)   . Hepatitis C 1979  . Hepatitis C reactive    LIVER BX 2008-CHRONIC ACTIVE HEPATITIS  . HTN (hypertension)   . Hx of adenomatous colonic polyps 2008   due for surveillance 2017  . Hypercholesterolemia   . IBS (irritable bowel syndrome)   . Knee pain   . Leukopenia 11/06/2015  . Normal cardiac stress test 05/25/2011   [facility]  . Normal echocardiogram 05/25/2011   EF 55%, mild TR  . Substance abuse    narcotic addiction  . Thrombocytopenia ([place]) 11/06/2015   Past Surgical History:  Procedure Laterality Date  . APPENDECTOMY    . CARPAL TUNNEL RELEASE     rt  . COLONOSCOPY  2008 SLF ARS D100 V8 PHEN 12.5   2 SIMPLE ADENOMAS (< 1 CM)  . ESOPHAGOGASTRODUODENOSCOPY  04/26/2012   Dr. Oneida Alar: Non-erosive gastritis (inflammation) was found in the gastric antrum but no H.pylori; multiple  biopsies (duodenal bx negative for Celiac)/ The mucosa of the esophagus appeared normal  . EUS  09/08/2012   Dr. Ardis Hughs: CBD dilated but no stones. Query secondary to Sphincter of Oddi stenosis, ?dysfunction, but clinically without symptoms  . HARDWARE REMOVAL Right 02/12/2014   Procedure: HARDWARE REMOVAL;  Surgeon: Jolyn Nap, MD;  Location: [place];  Service: Orthopedics;  Laterality: Right;  . KNEE ARTHROSCOPY    . Neurostimulator implant    . right ring finger    . spinal stenosis, had screws put in neck  04/11/2009   DR KRITZER  . TONSILLECTOMY    . ULNA OSTEOTOMY Right 02/12/2014   Procedure: RIGHT ULNAR SHORTENING AND OSTEOTOMY ;  Surgeon: Jolyn Nap, MD;  Location: [place];  Service: Orthopedics;  Laterality: Right;  . ULNAR NERVE TRANSPOSITION    . UPPER GASTROINTESTINAL ENDOSCOPY  2008 SLF ABD PAIN WEIGHT LOSS d100 v8 phen 12.5   NL  . WRIST SURGERY Right jan 2015   Dr. Edmonia Lynch   Family History  Problem Relation Age of Onset  . Bladder Cancer Mother        rare form   . Cancer Sister        Metastatic abdominal  . Emotional abuse Sister   .  Stroke Sister   . Emotional abuse Sister   . Heart failure Father   . Stroke Father   . Alcohol abuse Father   . Dementia Paternal Aunt   . Alcohol abuse Paternal Uncle   . Dementia Paternal Uncle   . Dementia Paternal Grandmother   . Stroke Paternal Grandmother   . ADD / ADHD Cousin   . Bipolar disorder Cousin   . Anxiety disorder Cousin   . Depression Cousin        Committed suicide  . Alcohol abuse Cousin   . OCD Cousin   . Paranoid behavior Cousin   . Brain cancer Maternal Grandfather   . Heart defect Unknown        FAMILY HX  . Colon cancer Maternal Uncle   . Colon polyps Neg Hx   . Drug abuse Neg Hx   . Schizophrenia Neg Hx   . Seizures Neg Hx   . Sexual abuse Neg Hx   . Physical abuse Neg Hx    History  Sexual Activity  . Sexual activity: No    Outpatient  Encounter Prescriptions as of 05/05/2017  Medication Sig  . acetaminophen (TYLENOL) 500 MG tablet Take 500 mg by mouth as needed.  Marland Kitchen aspirin EC 81 MG tablet Take 81 mg by mouth daily.  Marland Kitchen azelastine (ASTELIN) 0.1 % nasal spray Place 2 sprays into both nostrils 2 (two) times daily. Use in each nostril as directed (Patient taking differently: Place 2 sprays into both nostrils 2 (two) times daily as needed for allergies. Use in each nostril as directed)  . clonazePAM (KLONOPIN) 0.5 MG tablet Take 1 tablet (0.5 mg total) by mouth 3 (three) times daily as needed for anxiety.  . cloNIDine (CATAPRES) 0.2 MG tablet TAKE (1) TABLET BY MOUTH TWICE DAILY.  . cyclobenzaprine (FLEXERIL) 10 MG tablet TAKE (1) TABLET BY MOUTH AT BEDTIME.  Marland Kitchen FLUoxetine (PROZAC) 20 MG capsule Take 1 capsule (20 mg total) by mouth 2 (two) times daily.  Marland Kitchen gabapentin (NEURONTIN) 400 MG capsule Take 1 capsule (400 mg total) by mouth 3 (three) times daily.  Marland Kitchen HYDROcodone-acetaminophen (NORCO/VICODIN) 5-325 MG tablet Take 1 tablet by mouth 2 (two) times daily.  Marland Kitchen ibuprofen (ADVIL,MOTRIN) 200 MG tablet Take 400-600 mg by mouth every 6 (six) hours as needed. For pain  . Lactobacillus (DIGESTIVE HEALTH PROBIOTIC PO) Take 1 capsule by mouth as needed.   . milk thistle 175 MG tablet Take 175 mg by mouth daily.  . Multiple Vitamins-Minerals (MULTIVITAMIN ADULTS 50+) TABS Take 1 tablet by mouth daily.  Marland Kitchen olopatadine (PATANOL) 0.1 % ophthalmic solution Place 1 drop into both eyes 2 (two) times daily.  . Omega-3 Fatty Acids (FISH OIL) 1000 MG CAPS Take 1 capsule by mouth daily.  Marland Kitchen omeprazole (PRILOSEC) 20 MG capsule Take 20 mg by mouth daily.  . QUEtiapine (SEROQUEL) 50 MG tablet Take by mouth 1 tablet 3 times daily and 2 at bedtime  . senna (SENOKOT) 8.6 MG TABS tablet Take 1 tablet by mouth daily as needed for mild constipation.   No facility-administered encounter medications on file as of 05/05/2017.     Activities of Daily Living In your  present state of health, do you have any difficulty performing the following activities: 05/05/2017 01/13/2017  Hearing? N N  Vision? Y N  Comment Recommend follow up with eye Dr.  Marland Kitchen  Difficulty concentrating or making decisions? N N  Walking or climbing stairs? N N  Dressing or bathing? N N  Doing errands, shopping? N N  Preparing Food and eating ? N -  Using the Toilet? N -  In the past six months, have you accidently leaked urine? N -  Do you have problems with loss of bowel control? N -  Managing your Medications? N -  Managing your Finances? N -  Housekeeping or managing your Housekeeping? N -  Some recent data might be hidden    Patient Care Team: Fayrene Helper, MD as PCP - General Fields, Marga Melnick, MD (Gastroenterology) Leta Baptist, MD as Consulting Physician (Otolaryngology) Dalton-Bethea, Fabio Asa, MD (Physical Medicine and Rehabilitation) Milly Jakob, MD as Consulting Physician (Orthopedic Surgery) Cloria Spring, MD as Consulting Physician ([place])   Assessment:    Exercise Activities and Dietary recommendations Current Exercise Habits: Home exercise routine, Type of exercise: walking, Time (Minutes): 20, Frequency (Times/Week): 7, Weekly Exercise (Minutes/Week): 140, Intensity: Mild, Exercise limited by: None identified  Goals    None     Fall Risk Fall Risk  05/05/2017  Falls in the past year? Yes  Number falls in past yr: 2 or more  Injury with Fall? No  Follow up Falls evaluation completed;Education provided;Falls prevention discussed   Depression Screen PHQ 2/9 Scores 05/05/2017 01/13/2017 07/20/2016 10/21/2015  PHQ - 2 Score 1 0 - 4  PHQ- 9 Score 5 - - 14  Exception Documentation - - Other- indicate reason in comment box -  Not completed - - Patient currently actively treated by Psychiatry  -    Cognitive Function: Normal   6CIT Screen 05/05/2017  What Year? 0 points  What month? 0 points  What time? 0 points  Count back from 20 0 points   Months in reverse 0 points  Repeat phrase 0 points  Total Score 0    Immunization History  Administered Date(s) Administered  . Influenza Split 06/20/2012  . Influenza,inj,Quad PF,6+ Mos 04/24/2013, 08/28/2014, 06/13/2015  . Tdap 12/24/2011  . Zoster 10/04/2013   Screening Tests Health Maintenance  Topic Date Due  . COLONOSCOPY  09/29/2003  . INFLUENZA VACCINE  03/10/2017  . TETANUS/TDAP  12/23/2021  . Hepatitis C Screening  Completed  . HIV Screening  Completed      Plan:   I have personally reviewed and noted the following in the patient's chart:   . Medical and social history . Use of alcohol, tobacco or illicit drugs  . Current medications and supplements . Functional ability and status . Nutritional status . Physical activity . Advanced directives . List of other physicians . Hospitalizations, surgeries, and ER visits in previous 12 months . Vitals . Screenings to include cognitive, depression, and falls . Referrals and appointments: Referral to Dr. Barney Drain sent today for colonoscopy.   In addition, I have reviewed and discussed with patient certain preventive protocols, quality metrics, and best practice recommendations. A written personalized care plan for preventive services as well as general preventive health recommendations were provided to patient.     Stormy Fabian, LPN  [id number]

## 2017-05-12 ENCOUNTER — Telehealth (HOSPITAL_COMMUNITY): Payer: Self-pay | Admitting: *Deleted

## 2017-05-12 NOTE — Telephone Encounter (Signed)
returned phone call to patient, left voice message his appointment for 05/13/17 has been cancelled as he requested.

## 2017-05-13 ENCOUNTER — Ambulatory Visit (HOSPITAL_COMMUNITY): Payer: Self-pay | Admitting: Psychiatry

## 2017-06-14 ENCOUNTER — Other Ambulatory Visit (HOSPITAL_COMMUNITY): Payer: Self-pay | Admitting: Psychiatry

## 2017-06-15 ENCOUNTER — Ambulatory Visit: Payer: Medicare HMO | Admitting: Family Medicine

## 2017-06-16 ENCOUNTER — Ambulatory Visit (HOSPITAL_COMMUNITY): Payer: Self-pay | Admitting: Psychiatry

## 2017-06-29 ENCOUNTER — Ambulatory Visit (INDEPENDENT_AMBULATORY_CARE_PROVIDER_SITE_OTHER): Payer: Medicare HMO | Admitting: Psychiatry

## 2017-06-29 ENCOUNTER — Encounter (HOSPITAL_COMMUNITY): Payer: Self-pay | Admitting: Psychiatry

## 2017-06-29 VITALS — BP 124/78 | HR 86 | Ht 70.0 in | Wt 172.0 lb

## 2017-06-29 DIAGNOSIS — R450 Nervousness: Secondary | ICD-10-CM

## 2017-06-29 DIAGNOSIS — Z811 Family history of alcohol abuse and dependence: Secondary | ICD-10-CM

## 2017-06-29 DIAGNOSIS — Z87891 Personal history of nicotine dependence: Secondary | ICD-10-CM

## 2017-06-29 DIAGNOSIS — M2550 Pain in unspecified joint: Secondary | ICD-10-CM

## 2017-06-29 DIAGNOSIS — F331 Major depressive disorder, recurrent, moderate: Secondary | ICD-10-CM | POA: Diagnosis not present

## 2017-06-29 DIAGNOSIS — Z810 Family history of intellectual disabilities: Secondary | ICD-10-CM | POA: Diagnosis not present

## 2017-06-29 DIAGNOSIS — F419 Anxiety disorder, unspecified: Secondary | ICD-10-CM | POA: Diagnosis not present

## 2017-06-29 DIAGNOSIS — M549 Dorsalgia, unspecified: Secondary | ICD-10-CM | POA: Diagnosis not present

## 2017-06-29 DIAGNOSIS — Z818 Family history of other mental and behavioral disorders: Secondary | ICD-10-CM

## 2017-06-29 MED ORDER — FLUOXETINE HCL 20 MG PO CAPS: 20.0000 mg | ORAL_CAPSULE | Freq: Two times a day (BID) | ORAL | 2 refills | Status: DC

## 2017-06-29 MED ORDER — GABAPENTIN 400 MG PO CAPS: 400.0000 mg | ORAL_CAPSULE | Freq: Three times a day (TID) | ORAL | 2 refills | Status: DC

## 2017-06-29 MED ORDER — CLONAZEPAM 0.5 MG PO TABS: 0.5000 mg | ORAL_TABLET | Freq: Three times a day (TID) | ORAL | 2 refills | Status: DC | PRN

## 2017-06-29 MED ORDER — QUETIAPINE FUMARATE 50 MG PO TABS: ORAL_TABLET | ORAL | 2 refills | Status: DC

## 2017-06-29 NOTE — Progress Notes (Signed)
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 He has one son age 31 years old and lives in [place]  The patient states he has a history depression that started around 2009. Prior to that year he been treated with ribavirin to treat hepatitis C which made him very depressed. He's also had a number of deaths in his family-both sisters have died as well as both parents in the last several years. His son is not very close to him and he only talks to him periodically.  Currently the patient is struggling with medical problems including neck and back pain. He recently had a neurostimulator installed in his back which is helped to some degree. Lately he's been a bit more depressed because his aunt keeps calling and wanting to talk about all the dead relatives. He's not been seeing his counselor regularly and I think it would be helpful for him to get back into counseling. He does have friends and he walks his dog 2 miles a day. He admits to passive suicidal ideation but would never hurt himself.  Patient returns after 2 months.  For the most part he is doing okay.  He went to a high school reunion a few months ago and has reconnected with a lot of old friends.  They go out to eat at least once a week.  He gets his dog out every day and meet some people this way as well.  His main complaint is that he fell on his right shoulder couple of months ago and is having a lot of pain in his right arm.  He also has trigger finger in his left hand and cannot play his guitar.  He needs to go back to orthopedic surgery.  He is not taking anything for pain right now and has been back to the pain clinic in quite a while.  For the most part his mood is  stable and he is sleeping okay.  His anxiety is under good control Visit Diagnosis:    ICD-10-CM   1. Major depressive disorder, recurrent, moderate (HCC) F33.1     Past Psychiatric History: Prior treatment in our clinic for depression  Past Medical History:  Past Medical History:  Diagnosis Date  . Allergy   . Anxiety   . Arthritis   . Back problem   . Depression   . GERD (gastroesophageal reflux disease)   . Hepatitis C 1979  . Hepatitis C reactive    LIVER BX 2008-CHRONIC ACTIVE HEPATITIS  . HTN (hypertension)   . Hx of adenomatous colonic polyps 2008   due for surveillance 2017  . Hypercholesterolemia   . IBS (irritable bowel syndrome)   . Knee pain   . Leukopenia 11/06/2015  . Normal cardiac stress test 05/25/2011   [facility]  . Normal echocardiogram 05/25/2011   EF 55%, mild TR  . Substance abuse ([place])    narcotic addiction  . Thrombocytopenia (Marc [name]) 11/06/2015    Past Surgical History:  Procedure Laterality Date  . APPENDECTOMY    . CARPAL TUNNEL RELEASE     rt  . COLONOSCOPY  2008 SLF ARS D100 V8 PHEN 12.5   2 SIMPLE ADENOMAS (<  1 CM)  . ESOPHAGOGASTRODUODENOSCOPY ([facility]) N/A 04/26/2012   Performed by Danie Binder, MD at [facility]  . HARDWARE REMOVAL Right 02/12/2014   Performed by Jolyn Nap, MD at [facility] ARTHROSCOPY    . Neurostimulator implant    . right ring finger    . RIGHT ULNAR SHORTENING AND OSTEOTOMY Right 02/12/2014   Per[facility] by J[facility]yn Nap, [facility] at [facility]  . spinal stenosis, had screws put in neck  04/11/2009   DR KRITZER  . TONSILLECTOMY    . ULNAR NERVE TRANSPOSITION    . UPPER ENDOSCOPIC ULTRASOUND (EUS) LINEAR N/A 09/08/2012   Performed by Milus Banister, MD at [facility]  . UPPER GASTROINTESTINAL ENDOSCOPY  2008 SLF ABD PAIN WEIGHT LOSS d100 v8 phen 12.5   NL  . WRIST SURGERY Right jan 2015   Dr. Edmonia Lynch    Family Psychiatric History: See  below  Family History:  Family History  Problem Relation Age of Onset  . Bladder Cancer Mother        rare form   . Cancer Sister        Metastatic abdominal  . Emotional abuse Sister   . Stroke Sister   . Emotional abuse Sister   . Heart failure Father   . Stroke Father   . Alcohol abuse Father   . Dementia Paternal Aunt   . Alcohol abuse Paternal Uncle   . Dementia Paternal Uncle   . Dementia Paternal Grandmother   . Stroke Paternal Grandmother   . ADD / ADHD Cousin   . Bipolar disorder Cousin   . Anxiety disorder Cousin   . Depression Cousin        Committed suicide  . Alcohol abuse Cousin   . OCD Cousin   . Paranoid behavior Cousin   . Brain cancer Maternal Grandfather   . Heart defect Unknown        FAMILY HX  . Colon cancer Maternal Uncle   . Colon polyps Neg Hx   . Drug abuse Neg Hx   . Schizophrenia Neg Hx   . Seizures Neg Hx   . Sexual abuse Neg Hx   . Physical abuse Neg Hx     Social History:  Social History   Socioeconomic History  . Marital status: Divorced    Spouse name: None  . Number of children: None  . Years of education: None  . Highest education level: None  Social Needs  . Financial resource strain: None  . Food insecurity - worry: None  . Food insecurity - inability: None  . Transportation needs - medical: None  . Transportation needs - non-medical: None  Occupational History  . None  Tobacco Use  . Smoking status: Former Smoker    Packs/day: 1.50    Years: 8.00    Pack years: 12.00    Types: Cigarettes    Last attempt to quit: 08/13/1986    Years since quitting: 30.8  . Smokeless tobacco: Never Used  . Tobacco comment: quit 20 + yrs ago  Substance and Sexual Activity  . Alcohol use: No    Alcohol/week: 0.0 oz  . Drug use: No  . Sexual activity: No  Other Topics Concern  . None  Social History Narrative  . None    Allergies:  Allergies  Allergen Reactions  . Levofloxacin Nausea Only and Other (See Comments)     "Creepy" feeling, skin didn't feel  right, sort of itchy.  . Ciprofloxacin     Other reaction(s): Sweating (intolerance)  . Sulfa Antibiotics     Metabolic Disorder Labs: Lab Results  Component Value Date   HGBA1C 5.5 10/22/2016   MPG 111 10/22/2016   MPG 111 10/21/2015   No results found for: PROLACTIN Lab Results  Component Value Date   CHOL 229 (H) 10/22/2016   TRIG 134 10/22/2016   HDL 57 10/22/2016   CHOLHDL 4.0 10/22/2016   VLDL 27 10/22/2016   LDLCALC 145 (H) 10/22/2016   LDLCALC 100 10/21/2015   Lab Results  Component Value Date   TSH 1.724 10/22/2016   TSH 1.29 10/21/2015    Therapeutic Level Labs: No results found for: LITHIUM No results found for: VALPROATE No components found for:  CBMZ  Current Medications: Current Outpatient Medications  Medication Sig Dispense Refill  . acetaminophen (TYLENOL) 500 MG tablet Take 500 mg by mouth as needed.    Marland Kitchen aspirin EC 81 MG tablet Take 81 mg by mouth daily.    Marland Kitchen azelastine (ASTELIN) 0.1 % nasal spray Place 2 sprays into both nostrils 2 (two) times daily. Use in each nostril as directed (Patient taking differently: Place 2 sprays into both nostrils 2 (two) times daily as needed for allergies. Use in each nostril as directed) 30 mL 4  . clonazePAM (KLONOPIN) 0.5 MG tablet Take 1 tablet (0.5 mg total) by mouth 3 (three) times daily as needed for anxiety. 90 tablet 2  . cloNIDine (CATAPRES) 0.2 MG tablet TAKE (1) TABLET BY MOUTH TWICE DAILY. 180 tablet 0  . cyclobenzaprine (FLEXERIL) 10 MG tablet TAKE (1) TABLET BY MOUTH AT BEDTIME. 30 tablet 3  . FLUoxetine (PROZAC) 20 MG capsule Take 1 capsule (20 mg total) by mouth 2 (two) times daily. 60 capsule 2  . gabapentin (NEURONTIN) 400 MG capsule Take 1 capsule (400 mg total) by mouth 3 (three) times daily. 90 capsule 2  . HYDROcodone-acetaminophen (NORCO/VICODIN) 5-325 MG tablet Take 1 tablet by mouth 2 (two) times daily.    Marland Kitchen ibuprofen (ADVIL,MOTRIN) 200 MG tablet Take 400-600  mg by mouth every 6 (six) hours as needed. For pain    . Lactobacillus (DIGESTIVE HEALTH PROBIOTIC PO) Take 1 capsule by mouth as needed.     . milk thistle 175 MG tablet Take 175 mg by mouth daily.    . Multiple Vitamins-Minerals (MULTIVITAMIN ADULTS 50+) TABS Take 1 tablet by mouth daily.    Marland Kitchen olopatadine (PATANOL) 0.1 % ophthalmic solution Place 1 drop into both eyes 2 (two) times daily. 5 mL 2  . Omega-3 Fatty Acids (FISH OIL) 1000 MG CAPS Take 1 capsule by mouth daily.    Marland Kitchen omeprazole (PRILOSEC) 20 MG capsule Take 20 mg by mouth daily.    . QUEtiapine (SEROQUEL) 50 MG tablet Take by mouth 1 tablet 3 times daily and 2 at bedtime 150 tablet 2  . senna (SENOKOT) 8.6 MG TABS tablet Take 1 tablet by mouth daily as needed for mild constipation.     No current facility-administered medications for this visit.      Musculoskeletal: Strength & Muscle Tone: abnormal Gait & Station: unsteady Patient leans: N/A  Psychiatric Specialty Exam: Review of Systems  Musculoskeletal: Positive for back pain, falls and joint pain.  Psychiatric/Behavioral: The patient is nervous/anxious.   All other systems reviewed and are negative.   Blood pressure 124/78, pulse 86, height 5\' 10"  (1.778 m), weight 172 lb (78 kg), SpO2 97 %.Body mass index  is 24.68 kg/m.  General Appearance: Disheveled, Neat and Well Groomed  Eye Contact:  Good  Speech:  Clear and Coherent  Volume:  Decreased  Mood:  Anxious  Affect:  Constricted  Thought Process:  Goal Directed  Orientation:  Full (Time, Place, and Person)  Thought Content: Rumination   Suicidal Thoughts:  No  Homicidal Thoughts:  No  Memory:  Immediate;   Good Recent;   Good Remote;   Good  Judgement:  Fair  Insight:  Fair  Psychomotor Activity:  Decreased  Concentration:  Concentration: Good and Attention Span: Good  Recall:  Good  Fund of Knowledge: Good  Language: Good  Akathisia:  No  Handed:  Right  AIMS (if indicated): not done  Assets:   Communication Skills Desire for Improvement Resilience Social Support Talents/Skills  ADL's:  Intact  Cognition: WNL  Sleep:  Fair   Screenings: PHQ2-9     Clinical Support from 05/05/2017 in [facility] Office Visit from 01/13/2017 in [facility] Office Visit from 10/21/2015 in [facility]  PHQ-2 Total Score  1  0  4  PHQ-9 Total Score  5  No data  14       Assessment and Plan: Patient is a 61 year old male with a history of depression and anxiety as well as overlay of chronic pain.  For now he is doing fairly well on the combination of Prozac 20 mg twice a day for depression, gabapentin 400 mg 3 times a day for anxiety clonazepam 0.5 mg 3 times a day as needed for breakthrough anxiety and Seroquel 50 mg 3 times a day and 100 mg at bedtime for mood stabilization.  He has declined further counseling here but agrees to return to see me in 3 months   [name], MD 06/29/2017, 11:30 AM

## 2017-07-07 ENCOUNTER — Other Ambulatory Visit: Payer: Self-pay

## 2017-07-07 ENCOUNTER — Telehealth: Payer: Self-pay | Admitting: *Deleted

## 2017-07-07 ENCOUNTER — Ambulatory Visit: Payer: Self-pay | Admitting: Family Medicine

## 2017-07-07 MED ORDER — CLONIDINE HCL 0.2 MG PO TABS: ORAL_TABLET | ORAL | 0 refills | Status: DC

## 2017-07-07 NOTE — Telephone Encounter (Signed)
Refilled

## 2017-07-07 NOTE — Telephone Encounter (Signed)
Patient states that he needs a refill of his Clonidine. Patient has an upcoming appt on Aug 11, 2017.  Patient will be out of medication before that appt  Patient pharmacy  is [facility]. Please advise. Thank you

## 2017-08-11 ENCOUNTER — Ambulatory Visit (INDEPENDENT_AMBULATORY_CARE_PROVIDER_SITE_OTHER): Payer: Medicare HMO | Admitting: Family Medicine

## 2017-08-11 ENCOUNTER — Ambulatory Visit (HOSPITAL_COMMUNITY)
Admission: RE | Admit: 2017-08-11 | Discharge: 2017-08-11 | Disposition: A | Discharge status: Home / Self Care | Payer: Medicare HMO | Source: Ambulatory Visit | Attending: Family Medicine | Admitting: Family Medicine

## 2017-08-11 ENCOUNTER — Other Ambulatory Visit: Payer: Self-pay | Admitting: Family Medicine

## 2017-08-11 ENCOUNTER — Encounter: Payer: Self-pay | Admitting: Family Medicine

## 2017-08-11 VITALS — BP 120/70 | HR 75 | Resp 75 | Ht 70.0 in | Wt 174.0 lb

## 2017-08-11 DIAGNOSIS — M79632 Pain in left forearm: Secondary | ICD-10-CM

## 2017-08-11 DIAGNOSIS — I10 Essential (primary) hypertension: Secondary | ICD-10-CM

## 2017-08-11 DIAGNOSIS — Z125 Encounter for screening for malignant neoplasm of prostate: Secondary | ICD-10-CM

## 2017-08-11 DIAGNOSIS — M25532 Pain in left wrist: Secondary | ICD-10-CM | POA: Diagnosis not present

## 2017-08-11 DIAGNOSIS — G894 Chronic pain syndrome: Secondary | ICD-10-CM

## 2017-08-11 DIAGNOSIS — M7989 Other specified soft tissue disorders: Secondary | ICD-10-CM | POA: Insufficient documentation

## 2017-08-11 DIAGNOSIS — M65332 Trigger finger, left middle finger: Secondary | ICD-10-CM | POA: Diagnosis not present

## 2017-08-11 DIAGNOSIS — F329 Major depressive disorder, single episode, unspecified: Secondary | ICD-10-CM | POA: Diagnosis not present

## 2017-08-11 DIAGNOSIS — E559 Vitamin D deficiency, unspecified: Secondary | ICD-10-CM | POA: Diagnosis not present

## 2017-08-11 DIAGNOSIS — Z23 Encounter for immunization: Secondary | ICD-10-CM | POA: Diagnosis not present

## 2017-08-11 DIAGNOSIS — F419 Anxiety disorder, unspecified: Secondary | ICD-10-CM

## 2017-08-11 DIAGNOSIS — S59912A Unspecified injury of left forearm, initial encounter: Secondary | ICD-10-CM | POA: Diagnosis not present

## 2017-08-11 DIAGNOSIS — S6992XA Unspecified injury of left wrist, hand and finger(s), initial encounter: Secondary | ICD-10-CM | POA: Diagnosis not present

## 2017-08-11 MED ORDER — ONDANSETRON HCL 4 MG PO TABS: ORAL_TABLET | ORAL | 0 refills | Status: DC

## 2017-08-11 MED ORDER — IBUPROFEN 800 MG PO TABS: 800.0000 mg | ORAL_TABLET | Freq: Three times a day (TID) | ORAL | 0 refills | Status: DC

## 2017-08-11 MED ORDER — TRAMADOL HCL 50 MG PO TABS: ORAL_TABLET | ORAL | 0 refills | Status: DC

## 2017-08-11 NOTE — Patient Instructions (Addendum)
Physical exam end March call if you need me sooner  Xray of right forearm and wrist today  Flu vaccine today  1 week course of ibuprofen and tramadol 1 at bedtime prescribed for acute pain , also zofran 1 daily as needed for 1 week only  NO MORE INJURIES  You have been referred to youur ortho Doc for follow up , his office will call you  Fasting lipid, chem 7 , lipid, CBC, PSA , TSH and vit D 5 days before f/u

## 2017-08-11 NOTE — Progress Notes (Signed)
   Marc [name]     MRN: [id number]      DOB: 1956/01/30   HPI Mr. Genis is here for follow up and re-evaluation of chronic medical conditions, medication management and review of any available recent lab and radiology data.  Preventive health is updated, specifically  Cancer screening and Immunization.   Questions or concerns regarding consultations or procedures which the PT has had in the interim are  addressed. The PT denies any adverse reactions to current medications since the last visit.  Acute trauma to left forearm on 12/26 since then limited movemnent and uncontrolled pain, also has left middle trigger finger Also c/o rebent trauma to left thumb hit in car door reportedly, bruise noted under nail  ROS Denies recent fever or chills. Denies sinus pressure, nasal congestion, ear pain or sore throat. Denies chest congestion, productive cough or wheezing. Denies chest pains, palpitations and leg swelling Denies abdominal pain, nausea, vomiting,diarrhea or constipation.   Denies dysuria, frequency, hesitancy or incontinence.  Denies headaches, seizures, numbness, or tingling. Denies uncontrolled  depression, anxiety or insomnia. Denies skin break down or rash.   PE  BP 120/70   Pulse 75   Resp (!) 75   Ht 5\' 10"  (1.778 m)   Wt 174 lb (78.9 kg)   SpO2 92%   BMI 24.97 kg/m   Patient alert and oriented and in no cardiopulmonary distress.  HEENT: No facial asymmetry, EOMI,   oropharynx pink and moist.  Neck supple no JVD, no mass.  Chest: Clear to auscultation bilaterally.  CVS: S1, S2 no murmurs, no S3.Regular rate.  ABD: Soft non tender.   Ext: No edema  MS: Adequate ROM spine, shoulders, hips and knees. Left distal forearm and wrist swollen , erythematous and deformed with limitation in mobility  Skin: Intact, no ulcerations or rash noted.  Psych: Good eye contact, normal affect. Memory intact not anxious or depressed appearing.  CNS: CN 2-12 intact, power,   normal throughout.no focal deficits noted.   Assessment & Plan  Acute pain of left wrist Trauma on 12/24 following which pt reports worsening pain , redness and deformity with reduced mobility, exam suspicious for fracture, xray of forearm and wrist Tramadol for pain and relief and referral to his orthopedic Doc soonest available  Left forearm pain H/o trauma approx 2 weeks ago with progressive pain and swelling and limited mobility with abn exam, needs x ray and ortho follow up  Anxiety and depression Managed by psychiatry not suicidal or homicidal  Chronic pain syndrome presents with recurrent frequent injuries, no narcotic medication prescribed, has h/o opioid addiction, limited tramadol prescribed however at this visit with ortho eval asap  Essential hypertension Controlled, no change in medication DASH diet and commitment to daily physical activity for a minimum of 30 minutes discussed and encouraged, as a part of hypertension management. The importance of attaining a healthy weight is also discussed.  BP/Weight 08/11/2017 05/05/2017 03/31/2017 03/25/2017 01/13/2017 10/20/2016 6/94/8546  Systolic BP 270 80 350 093 818 299 371  Diastolic BP 70 54 86 80 82 80 78  Wt. (Lbs) 174 172.04 171.4 169 175.08 177 175  BMI 24.97 24.69 24.59 24.25 25.12 25.4 25.11  Some encounter information is confidential and restricted. Go to Review Flowsheets activity to see all data.       Trigger middle finger of left hand Needs surgery however has been putting this off , states will likely proceed in the near futures

## 2017-08-12 ENCOUNTER — Telehealth: Payer: Self-pay | Admitting: Family Medicine

## 2017-08-12 ENCOUNTER — Other Ambulatory Visit: Payer: Self-pay | Admitting: Family Medicine

## 2017-08-12 ENCOUNTER — Encounter: Payer: Self-pay | Admitting: Family Medicine

## 2017-08-12 MED ORDER — IBUPROFEN 600 MG PO TABS: 600.0000 mg | ORAL_TABLET | Freq: Three times a day (TID) | ORAL | 0 refills | Status: DC | PRN

## 2017-08-12 NOTE — Telephone Encounter (Signed)
He is asking about his results of xray

## 2017-08-12 NOTE — Telephone Encounter (Signed)
Addressed in another note

## 2017-08-12 NOTE — Telephone Encounter (Signed)
Patient left message on nurse line stating he saw his x-ray results online that states he does not have a fracture in his wrist, but did see 'where it said degenerative sclerosis'. He states he is in a lot of pain and that it is swollen twice as much as yesterday.

## 2017-08-12 NOTE — Telephone Encounter (Signed)
Patient left a message on the office phone that his wrist is swollen and in pain, says he thinks it may be broken Cb#: [phone]

## 2017-08-12 NOTE — Telephone Encounter (Signed)
He has been referred to ortho he already is established with, he can call to be seen there , I already prescribed tramadol for him, I will add an anti inflammatory which is what is useful for swelling and pain, please let  Him know

## 2017-08-13 NOTE — Telephone Encounter (Signed)
Messaged via mychart.

## 2017-08-14 ENCOUNTER — Other Ambulatory Visit: Payer: Self-pay | Admitting: Family Medicine

## 2017-08-15 ENCOUNTER — Encounter: Payer: Self-pay | Admitting: Family Medicine

## 2017-08-15 DIAGNOSIS — M79632 Pain in left forearm: Secondary | ICD-10-CM

## 2017-08-15 DIAGNOSIS — M25532 Pain in left wrist: Secondary | ICD-10-CM | POA: Insufficient documentation

## 2017-08-15 HISTORY — DX: Pain in left forearm: M79.632

## 2017-08-15 NOTE — Assessment & Plan Note (Signed)
Managed by psychiatry not suicidal or homicidal
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
## 2017-08-15 NOTE — Assessment & Plan Note (Signed)
Controlled, no change in medication DASH diet and commitment to daily physical activity for a minimum of 30 minutes discussed and encouraged, as a part of hypertension management. The importance of attaining a healthy weight is also discussed.  BP/Weight 08/11/2017 05/05/2017 03/31/2017 03/25/2017 01/13/2017 10/20/2016 08/12/1592  Systolic BP 585 80 929 244 628 638 177  Diastolic BP 70 54 86 80 82 80 78  Wt. (Lbs) 174 172.04 171.4 169 175.08 177 175  BMI 24.97 24.69 24.59 24.25 25.12 25.4 25.11  Some encounter information is confidential and restricted. Go to Review Flowsheets activity to see all data.

## 2017-08-15 NOTE — Assessment & Plan Note (Signed)
Needs surgery however has been putting this off , states will likely proceed in the near futures

## 2017-08-15 NOTE — Assessment & Plan Note (Signed)
presents with recurrent frequent injuries, no narcotic medication prescribed, has h/o opioid addiction, limited tramadol prescribed however at this visit with ortho eval asap
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
## 2017-08-16 ENCOUNTER — Other Ambulatory Visit: Payer: Self-pay | Admitting: Family Medicine

## 2017-08-16 ENCOUNTER — Telehealth: Payer: Self-pay | Admitting: Family Medicine

## 2017-08-16 MED ORDER — TRAMADOL HCL 50 MG PO TABS: 50.0000 mg | ORAL_TABLET | Freq: Two times a day (BID) | ORAL | 0 refills | Status: DC

## 2017-08-16 NOTE — Progress Notes (Signed)
Tramadol prescribed twice daily for 5 days, message left omn machine, pt was in office earlier this morning and left without a direct response, I called this pm and left a message on his telephone

## 2017-08-16 NOTE — Telephone Encounter (Signed)
Message x 2 left on pateint's  telephone , medication has been prescribed and sent to his pharmacy this pm

## 2017-08-16 NOTE — Telephone Encounter (Signed)
Patient was made aware of your response that tramadol had been sent to last until appt with Dr Gertie Fey. He requests something to last him until tomorrow

## 2017-08-16 NOTE — Telephone Encounter (Signed)
Pt is calling in and needs a RX for Pain, Seeing Dr Gertie Fey tomorrow at 11am  Pain is worse

## 2017-08-16 NOTE — Telephone Encounter (Signed)
.  please see below message

## 2017-08-17 NOTE — Telephone Encounter (Signed)
Patient left message stating wrist still swollen

## 2017-08-17 NOTE — Telephone Encounter (Signed)
Called patient regarding message below. No answer, unable to leave message. Message was left yesterday. Patient was to see Gertie Fey today.

## 2017-08-18 DIAGNOSIS — M65332 Trigger finger, left middle finger: Secondary | ICD-10-CM | POA: Diagnosis not present

## 2017-08-18 DIAGNOSIS — M25532 Pain in left wrist: Secondary | ICD-10-CM | POA: Diagnosis not present

## 2017-09-29 ENCOUNTER — Ambulatory Visit (HOSPITAL_COMMUNITY): Payer: Self-pay | Admitting: Psychiatry

## 2017-10-06 ENCOUNTER — Ambulatory Visit (HOSPITAL_COMMUNITY): Payer: Medicare HMO | Admitting: Psychiatry

## 2017-10-06 ENCOUNTER — Encounter (HOSPITAL_COMMUNITY): Payer: Self-pay | Admitting: Psychiatry

## 2017-10-06 VITALS — BP 136/84 | HR 91 | Ht 70.0 in | Wt 170.0 lb

## 2017-10-06 DIAGNOSIS — Z811 Family history of alcohol abuse and dependence: Secondary | ICD-10-CM

## 2017-10-06 DIAGNOSIS — F331 Major depressive disorder, recurrent, moderate: Secondary | ICD-10-CM

## 2017-10-06 DIAGNOSIS — M549 Dorsalgia, unspecified: Secondary | ICD-10-CM

## 2017-10-06 DIAGNOSIS — Z818 Family history of other mental and behavioral disorders: Secondary | ICD-10-CM | POA: Diagnosis not present

## 2017-10-06 DIAGNOSIS — M2550 Pain in unspecified joint: Secondary | ICD-10-CM | POA: Diagnosis not present

## 2017-10-06 DIAGNOSIS — M542 Cervicalgia: Secondary | ICD-10-CM | POA: Diagnosis not present

## 2017-10-06 DIAGNOSIS — Z87891 Personal history of nicotine dependence: Secondary | ICD-10-CM | POA: Diagnosis not present

## 2017-10-06 DIAGNOSIS — Z810 Family history of intellectual disabilities: Secondary | ICD-10-CM

## 2017-10-06 MED ORDER — CLONAZEPAM 0.5 MG PO TABS: 0.5000 mg | ORAL_TABLET | Freq: Three times a day (TID) | ORAL | 2 refills | Status: DC | PRN

## 2017-10-06 MED ORDER — QUETIAPINE FUMARATE 50 MG PO TABS: ORAL_TABLET | ORAL | 2 refills | Status: DC

## 2017-10-06 MED ORDER — FLUOXETINE HCL 20 MG PO CAPS: ORAL_CAPSULE | ORAL | 2 refills | Status: DC

## 2017-10-06 MED ORDER — GABAPENTIN 400 MG PO CAPS: 400.0000 mg | ORAL_CAPSULE | Freq: Three times a day (TID) | ORAL | 2 refills | Status: DC

## 2017-10-06 NOTE — Progress Notes (Signed)
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 He has one son age 23 years old and lives in [place]  The patient states he has a history depression that started around 2009. Prior to that year he been treated with ribavirin to treat hepatitis C which made him very depressed. He's also had a number of deaths in his family-both sisters have died as well as both parents in the last several years. His son is not very close to him and he only talks to him periodically.  Currently the patient is struggling with medical problems including neck and back pain. He recently had a neurostimulator installed in his back which is helped to some degree. Lately he's been a bit more depressed because his aunt keeps calling and wanting to talk about all the dead relatives. He's not been seeing his counselor regularly and I think it would be helpful for him to get back into counseling. He does have friends and he walks his dog 2 miles a day. He admits to passive suicidal ideation but would never hurt himself.  Patient returns after 3 months.  He states he has been doing okay for the most part but has gotten somewhat more depressed.  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  He feels bad now that he is not working and does not feel productive.  He is going to have some cards made and put them out in hopes of getting some more part-time work.  He is not suicidal and he is spending time with friends.  He recently injured his left arm and has it in the brace.  His wrist is sprained and he cannot play the guitar which has been depressing as well.  His sleep is somewhat variable.  We  discussed increasing Prozac or switching  medications and he elected to increase the Prozac Visit Diagnosis:    ICD-10-CM   1. Major depressive disorder, recurrent, moderate (HCC) F33.1     Past Psychiatric History: Prior treatment in our clinic for depression  Past Medical History:  Past Medical History:  Diagnosis Date  . Allergy   . Anxiety   . Arthritis   . Back problem   . Depression   . GERD (gastroesophageal reflux disease)   . Hepatitis C 1979  . Hepatitis C reactive    LIVER BX 2008-CHRONIC ACTIVE HEPATITIS  . HTN (hypertension)   . Hx of adenomatous colonic polyps 2008   due for surveillance 2017  . Hypercholesterolemia   . IBS (irritable bowel syndrome)   . Knee pain   . Leukopenia 11/06/2015  . Normal cardiac stress test 05/25/2011   [facility]  . Normal echocardiogram 05/25/2011   EF 55%, mild TR  . Substance abuse ([place])    narcotic addiction  . Thrombocytopenia ([place]) 11/06/2015    Past Surgical History:  Procedure Laterality Date  . APPENDECTOMY    . CARPAL TUNNEL RELEASE     rt  . COLONOSCOPY  2008 [facility] ARS D100 V8 PHEN 12.5   2 SIMPLE ADENOMAS (< 1 CM)  . ESOPHAGOGASTRODUODENOSCOPY  04/26/2012   Dr. Oneida Alar: Non-erosive gastritis (inflammation) was found in the  gastric antrum but no H.pylori; multiple biopsies (duodenal bx negative for Celiac)/ The mucosa of the esophagus appeared normal  . EUS  09/08/2012   Dr. Ardis Hughs: CBD dilated but no stones. Query secondary to Sphincter of Oddi stenosis, ?dysfunction, but clinically without symptoms  . HARDWARE REMOVAL Right 02/12/2014   Procedure: HARDWARE REMOVAL;  Surgeon: Jolyn Nap, MD;  Location: [facility];  Service: Orthopedics;  Laterality: Right;  . KNEE ARTHROSCOPY    . Neurostimulator implant    . right ring finger    . spinal stenosis, had screws put in neck  04/11/2009   DR KRITZER  . TONSILLECTOMY    . ULNA OSTEOTOMY Right 02/12/2014   Procedure: RIGHT ULNAR SHORTENING AND OSTEOTOMY ;  Surgeon: Jolyn Nap, MD;  Location: [facility];  Service: Orthopedics;  Laterality: Right;  . ULNAR NERVE TRANSPOSITION    . UPPER GASTROINTESTINAL ENDOSCOPY  2008 [facility]F ABD PAIN WEIGHT LOSS d100 v8 phen 12.5   NL  . WRIST SURGERY Right jan 2015   Dr. Edmonia Lynch    Family Psychiatric History: See below  Family History:  Family History  Problem Relation Age of Onset  . Bladder Cancer Mother        rare form   . Cancer Sister        Metastatic abdominal  . Emotional abuse Sister   . Stroke Sister   . Emotional abuse Sister   . Heart failure Father   . Stroke Father   . Alcohol abuse Father   . Dementia Paternal Aunt   . Alcohol abuse Paternal Uncle   . Dementia Paternal Uncle   . Dementia Paternal Grandmother   . Stroke Paternal Grandmother   . ADD / ADHD Cousin   . Bipolar disorder Cousin   . Anxiety disorder Cousin   . Depression Cousin        Committed suicide  . Alcohol abuse Cousin   . OCD Cousin   . Paranoid behavior Cousin   . Brain cancer Maternal Grandfather   . Heart defect Unknown        FAMILY HX  . Colon cancer Maternal Uncle   . Colon polyps Neg Hx   . Drug abuse Neg Hx   . Schizophrenia Neg Hx   . Seizures Neg Hx   . Sexual abuse Neg Hx   . Physical abuse Neg Hx     Social History:  Social History   Socioeconomic History  . Marital status: Divorced    Spouse name: None  . Number of children: None  . Years of education: None  . Highest education level: None  Social Needs  . Financial resource strain: None  . Food insecurity - worry: None  . Food insecurity - inability: None  . Transportation needs - medical: None  . Transportation needs - non-medical: None  Occupational History  . None  Tobacco Use  . Smoking status: Former Smoker    Packs/day: 1.50    Years: 8.00    Pack years: 12.00    Types: Cigarettes    Last attempt to quit: 08/13/1986    Years since quitting: 31.1  . Smokeless tobacco: Never Used  . Tobacco comment: quit  20 + yrs ago  Substance and Sexual Activity  . Alcohol use: No    Alcohol/week: 0.0 oz  . Drug use: No  . Sexual activity: No  Other Topics Concern  . None  Social History Narrative  . None  Allergies:  Allergies  Allergen Reactions  . Levofloxacin Nausea Only and Other (See Comments)    "Creepy" feeling, skin didn't feel right, sort of itchy.  . Ciprofloxacin     Other reaction(s): Sweating (intolerance)  . Sulfa Antibiotics     Metabolic Disorder Labs: Lab Results  Component Value Date   HGBA1C 5.5 10/22/2016   MPG 111 10/22/2016   MPG 111 10/21/2015   No results found for: PROLACTIN Lab Results  Component Value Date   CHOL 229 (H) 10/22/2016   TRIG 134 10/22/2016   HDL 57 10/22/2016   CHOLHDL 4.0 10/22/2016   VLDL 27 10/22/2016   LDLCALC 145 (H) 10/22/2016   LDLCALC 100 10/21/2015   Lab Results  Component Value Date   TSH 1.724 10/22/2016   TSH 1.29 10/21/2015    Therapeutic Level Labs: No results found for: LITHIUM No results found for: VALPROATE No components found for:  CBMZ  Current Medications: Current Outpatient Medications  Medication Sig Dispense Refill  . acetaminophen (TYLENOL) 500 MG tablet Take 500 mg by mouth as needed.    Marland Kitchen aspirin EC 81 MG tablet Take 81 mg by mouth daily.    Marland Kitchen azelastine (ASTELIN) 0.1 % nasal spray Place 2 sprays into both nostrils 2 (two) times daily. Use in each nostril as directed (Patient taking differently: Place 2 sprays into both nostrils 2 (two) times daily as needed for allergies. Use in each nostril as directed) 30 mL 4  . clonazePAM (KLONOPIN) 0.5 MG tablet Take 1 tablet (0.5 mg total) by mouth 3 (three) times daily as needed for anxiety. 90 tablet 2  . cloNIDine (CATAPRES) 0.2 MG tablet TAKE (1) TABLET BY MOUTH TWICE DAILY. 180 tablet 0  . cyclobenzaprine (FLEXERIL) 10 MG tablet TAKE (1) TABLET BY MOUTH AT BEDTIME. 30 tablet 3  . FLUoxetine (PROZAC) 20 MG capsule Take two in the morning and one in the  afternoon 90 capsule 2  . gabapentin (NEURONTIN) 400 MG capsule Take 1 capsule (400 mg total) by mouth 3 (three) times daily. 90 capsule 2  . ibuprofen (ADVIL,MOTRIN) 600 MG tablet Take 1 tablet (600 mg total) by mouth every 8 (eight) hours as needed. 30 tablet 0  . Lactobacillus (DIGESTIVE HEALTH PROBIOTIC PO) Take 1 capsule by mouth as needed.     . milk thistle 175 MG tablet Take 175 mg by mouth daily.    . Multiple Vitamins-Minerals (MULTIVITAMIN ADULTS 50+) TABS Take 1 tablet by mouth daily.    Marland Kitchen olopatadine (PATANOL) 0.1 % ophthalmic solution Place 1 drop into both eyes 2 (two) times daily. 5 mL 2  . Omega-3 Fatty Acids (FISH OIL) 1000 MG CAPS Take 1 capsule by mouth daily.    Marland Kitchen omeprazole (PRILOSEC) 20 MG capsule Take 20 mg by mouth daily.    . ondansetron (ZOFRAN) 4 MG tablet One tablet once daily as needed, for uncontrolled nausea 7 tablet 0  . QUEtiapine (SEROQUEL) 50 MG tablet Take by mouth 1 tablet 3 times daily and 2 at bedtime 150 tablet 2  . senna (SENOKOT) 8.6 MG TABS tablet Take 1 tablet by mouth daily as needed for mild constipation.     No current facility-administered medications for this visit.      Musculoskeletal: Strength & Muscle Tone: within normal limits Gait & Station: normal Patient leans: N/A  Psychiatric Specialty Exam: Review of Systems  Musculoskeletal: Positive for joint pain.  Psychiatric/Behavioral: Positive for depression.  All other systems reviewed and are negative.  Blood pressure 136/84, pulse 91, height 5\' 10"  (1.778 m), weight 170 lb (77.1 kg), SpO2 98 %.Body mass index is 24.39 kg/m.  General Appearance: Casual and Fairly Groomed  Eye Contact:  Good  Speech:  Clear and Coherent  Volume:  Decreased  Mood:  Dysphoric  Affect:  Constricted  Thought Process:  Goal Directed  Orientation:  Full (Time, Place, and Person)  Thought Content: Rumination   Suicidal Thoughts:  No  Homicidal Thoughts:  No  Memory:  Immediate;   Good Recent;    Good Remote;   Fair  Judgement:  Fair  Insight:  Fair  Psychomotor Activity:  Decreased  Concentration:  Concentration: Good and Attention Span: Good  Recall:  Good  Fund of Knowledge: Good  Language: Good  Akathisia:  No  Handed:  Right  AIMS (if indicated): not done  Assets:  Communication Skills Desire for Improvement Resilience Social Support Talents/Skills  ADL's:  Intact  Cognition: WNL  Sleep:  Fair   Screenings: PHQ2-9     Clinical Support from 05/05/2017 in [facility] Office Visit from 01/13/2017 in [facility] Office Visit from 10/21/2015 in [facility]  PHQ-2 Total Score  1  0  4  PHQ-9 Total Score  5  No data  14       Assessment and Plan: This patient is a 62 year old male with a history of depression and anxiety.  He also has difficulties with chronic pain and now has recently suffered a new injury to his left arm.  His depression has worsened slightly.  Therefore we will increase his Prozac to 60 mg daily.  He will continue Seroquel 50 mg 3 times a day and 100 mg at bedtime for mood stabilization, gabapentin 400 mg 3 times a day for anxiety and clonazepam 0.5 mg 3 times a day as needed for anxiety.  He will return to see me in 3 months   [name], MD 10/06/2017, 2:07 PM

## 2017-10-07 ENCOUNTER — Ambulatory Visit (HOSPITAL_COMMUNITY): Payer: Self-pay | Admitting: Internal Medicine

## 2017-10-07 ENCOUNTER — Other Ambulatory Visit (HOSPITAL_COMMUNITY): Payer: Self-pay

## 2017-10-11 ENCOUNTER — Other Ambulatory Visit: Payer: Self-pay | Admitting: Family Medicine

## 2017-10-26 ENCOUNTER — Other Ambulatory Visit (HOSPITAL_COMMUNITY): Payer: Self-pay

## 2017-10-26 ENCOUNTER — Ambulatory Visit (HOSPITAL_COMMUNITY): Payer: Self-pay | Admitting: Hematology

## 2017-11-02 ENCOUNTER — Other Ambulatory Visit (HOSPITAL_COMMUNITY): Payer: Self-pay | Admitting: *Deleted

## 2017-11-02 DIAGNOSIS — R7989 Other specified abnormal findings of blood chemistry: Secondary | ICD-10-CM

## 2017-11-02 DIAGNOSIS — B182 Chronic viral hepatitis C: Secondary | ICD-10-CM

## 2017-11-02 DIAGNOSIS — D696 Thrombocytopenia, unspecified: Secondary | ICD-10-CM

## 2017-11-03 ENCOUNTER — Other Ambulatory Visit (HOSPITAL_COMMUNITY): Payer: Self-pay

## 2017-11-03 ENCOUNTER — Ambulatory Visit (HOSPITAL_COMMUNITY): Payer: Self-pay | Admitting: Hematology

## 2017-12-01 ENCOUNTER — Other Ambulatory Visit (HOSPITAL_COMMUNITY): Payer: Self-pay | Admitting: Psychiatry

## 2017-12-07 ENCOUNTER — Ambulatory Visit (HOSPITAL_COMMUNITY): Payer: Medicare HMO | Admitting: Psychiatry

## 2017-12-13 ENCOUNTER — Ambulatory Visit (HOSPITAL_COMMUNITY): Payer: Medicare HMO | Admitting: Psychiatry

## 2017-12-15 ENCOUNTER — Encounter: Payer: Self-pay | Admitting: Family Medicine

## 2017-12-15 ENCOUNTER — Encounter (HOSPITAL_COMMUNITY): Payer: Self-pay | Admitting: Psychiatry

## 2017-12-15 ENCOUNTER — Ambulatory Visit (HOSPITAL_COMMUNITY): Payer: Medicare HMO | Admitting: Psychiatry

## 2017-12-15 VITALS — BP 114/73 | HR 72 | Ht 70.0 in | Wt 166.0 lb

## 2017-12-15 DIAGNOSIS — M542 Cervicalgia: Secondary | ICD-10-CM | POA: Diagnosis not present

## 2017-12-15 DIAGNOSIS — F331 Major depressive disorder, recurrent, moderate: Secondary | ICD-10-CM | POA: Diagnosis not present

## 2017-12-15 DIAGNOSIS — Z87891 Personal history of nicotine dependence: Secondary | ICD-10-CM | POA: Diagnosis not present

## 2017-12-15 DIAGNOSIS — R109 Unspecified abdominal pain: Secondary | ICD-10-CM

## 2017-12-15 DIAGNOSIS — Z811 Family history of alcohol abuse and dependence: Secondary | ICD-10-CM

## 2017-12-15 DIAGNOSIS — Z818 Family history of other mental and behavioral disorders: Secondary | ICD-10-CM | POA: Diagnosis not present

## 2017-12-15 DIAGNOSIS — Z810 Family history of intellectual disabilities: Secondary | ICD-10-CM

## 2017-12-15 DIAGNOSIS — M549 Dorsalgia, unspecified: Secondary | ICD-10-CM | POA: Diagnosis not present

## 2017-12-15 MED ORDER — CLONAZEPAM 0.5 MG PO TABS: 0.5000 mg | ORAL_TABLET | Freq: Three times a day (TID) | ORAL | 2 refills | Status: DC | PRN

## 2017-12-15 MED ORDER — QUETIAPINE FUMARATE 50 MG PO TABS: ORAL_TABLET | ORAL | 2 refills | Status: DC

## 2017-12-15 MED ORDER — GABAPENTIN 400 MG PO CAPS: 400.0000 mg | ORAL_CAPSULE | Freq: Three times a day (TID) | ORAL | 2 refills | Status: DC

## 2017-12-15 MED ORDER — FLUOXETINE HCL 20 MG PO CAPS: ORAL_CAPSULE | ORAL | 2 refills | Status: DC

## 2017-12-15 NOTE — Progress Notes (Signed)
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 He has one son age 53 years old and lives in [place]  The patient states he has a history depression that started around 2009. Prior to that year he been treated with ribavirin to treat hepatitis C which made him very depressed. He's also had a number of deaths in his family-both sisters have died as well as both parents in the last several years. His son isnotvery close to him and he only talks to him periodically.  Currently the patient is struggling with medical problems including neck and back pain. He recently had a neurostimulator installed in his back which is helped to some degree. Lately he's been a bit more depressed because his aunt keeps calling and wanting to talk about all the dead relatives. He's not been seeing his counselor regularly and I think it would be helpful for him to get back into counseling. He does have friends and he walks his dog 2 miles a day. He admits to passive suicidal ideation but would never hurt himself.  The patient returns after 3 months.  He states that he is having a lot more physical problems lately.  He had a stomach virus about a week ago and was vomiting and experiencing diarrhea.  In a few days later his dog pulled him hard on the leash and his left wrists was extremely painful and swollen.  He did not have it looked at.  It still a little swollen I urged him to contact Dr. Moshe Cipro his primary doctor.  Overall his mood is fairly good.  Last time we increase the Prozac and this seems to have helped a little bit.   he is reconnected with old friends from high school and this is helped him the most.  Generally he is sleeping well and he denies  suicidal ideation or severe depression.  Clonazepam continues to help his anxiety  Visit Diagnosis:    ICD-10-CM   1. Major depressive disorder, recurrent, moderate (HCC) F33.1     Past Psychiatric History: Prior treatment in our clinic for depression  Past Medical History:  Past Medical History:  Diagnosis Date  . Allergy   . Anxiety   . Arthritis   . Back problem   . Depression   . GERD (gastroesophageal reflux disease)   . Hepatitis C 1979  . Hepatitis C reactive    LIVER BX 2008-CHRONIC ACTIVE HEPATITIS  . HTN (hypertension)   . Hx of adenomatous colonic polyps 2008   due for surveillance 2017  . Hypercholesterolemia   . IBS (irritable bowel syndrome)   . Knee pain   . Leukopenia 11/06/2015  . Normal cardiac stress test 05/25/2011   [facility]  . Normal echocardiogram 05/25/2011   EF 55%, mild TR  . Substance abuse ([place])    narcotic addiction  . Thrombocytopenia ([place]) 11/06/2015    Past Surgical History:  Procedure Laterality Date  . APPENDECTOMY    . CARPAL TUNNEL RELEASE     rt  . COLONOSCOPY  2008 [facility] ARS D100 V8 PHEN 12.5   2 SIMPLE ADENOMAS (< 1 CM)  . ESOPHAGOGASTRODUODENOSCOPY  04/26/2012   Dr. Oneida Alar: Non-erosive gastritis (  inflammation) was found in the gastric antrum but no H.pylori; multiple biopsies (duodenal bx negative for Celiac)/ The mucosa of the esophagus appeared normal  . EUS  09/08/2012   Dr. Ardis Hughs: CBD dilated but no stones. Query secondary to Sphincter of Oddi stenosis, ?dysfunction, but clinically without symptoms  . HARDWARE REMOVAL Right 02/12/2014   Procedure: HARDWARE REMOVAL;  Surgeon: Jolyn Nap, MD;  Location: [facility];  Service: Orthopedics;  Laterality: Right;  . KNEE ARTHROSCOPY    . Neurostimulator implant    . right ring finger    . spinal stenosis, had screws put in neck  04/11/2009   DR KRITZER  . TONSILLECTOMY    . ULNA OSTEOTOMY Right 02/12/2014   Procedure: RIGHT ULNAR SHORTENING AND  OSTEOTOMY ;  Surgeon: Jolyn Nap, MD;  Location: [facility];  Service: Orthopedics;  Laterality: Right;  . ULNAR NERVE TRANSPOSITION    . UPPER GASTROINTESTINAL ENDOSCOPY  2008 [facility] ABD PAIN WEIGHT LOSS d100 v8 phen 12.5   NL  . WRIST SURGERY Right jan 2015   Dr. Edmonia Lynch    Family Psychiatric History: See below  Family History:  Family History  Problem Relation Age of Onset  . Bladder Cancer Mother        rare form   . Cancer Sister        Metastatic abdominal  . Emotional abuse Sister   . Stroke Sister   . Emotional abuse Sister   . Heart failure Father   . Stroke Father   . Alcohol abuse Father   . Dementia Paternal Aunt   . Alcohol abuse Paternal Uncle   . Dementia Paternal Uncle   . Dementia Paternal Grandmother   . Stroke Paternal Grandmother   . ADD / ADHD Cousin   . Bipolar disorder Cousin   . Anxiety disorder Cousin   . Depression Cousin        Committed suicide  . Alcohol abuse Cousin   . OCD Cousin   . Paranoid behavior Cousin   . Brain cancer Maternal Grandfather   . Heart defect Unknown        FAMILY HX  . Colon cancer Maternal Uncle   . Colon polyps Neg Hx   . Drug abuse Neg Hx   . Schizophrenia Neg Hx   . Seizures Neg Hx   . Sexual abuse Neg Hx   . Physical abuse Neg Hx     Social History:  Social History   Socioeconomic History  . Marital status: Divorced    Spouse name: Not on file  . Number of children: Not on file  . Years of education: Not on file  . Highest education level: Not on file  Occupational History  . Not on file  Social Needs  . Financial resource strain: Not on file  . Food insecurity:    Worry: Not on file    Inability: Not on file  . Transportation needs:    Medical: Not on file    Non-medical: Not on file  Tobacco Use  . Smoking status: Former Smoker    Packs/day: 1.50    Years: 8.00    Pack years: 12.00    Types: Cigarettes    Last attempt to quit: 08/13/1986    Years since quitting:  31.3  . Smokeless tobacco: Never Used  . Tobacco comment: quit 20 + yrs ago  Substance and Sexual Activity  . Alcohol use: No    Alcohol/week: 0.0  oz  . Drug use: No  . Sexual activity: Never  Lifestyle  . Physical activity:    Days per week: Not on file    Minutes per session: Not on file  . Stress: Not on file  Relationships  . Social connections:    Talks on phone: Not on file    Gets together: Not on file    Attends religious service: Not on file    Active member of club or organization: Not on file    Attends meetings of clubs or organizations: Not on file    Relationship status: Not on file  Other Topics Concern  . Not on file  Social History Narrative  . Not on file    Allergies:  Allergies  Allergen Reactions  . Levofloxacin Nausea Only and Other (See Comments)    "Creepy" feeling, skin didn't feel right, sort of itchy.  . Ciprofloxacin     Other reaction(s): Sweating (intolerance)  . Sulfa Antibiotics     Metabolic Disorder Labs: Lab Results  Component Value Date   HGBA1C 5.5 10/22/2016   MPG 111 10/22/2016   MPG 111 10/21/2015   No results found for: PROLACTIN Lab Results  Component Value Date   CHOL 229 (H) 10/22/2016   TRIG 134 10/22/2016   HDL 57 10/22/2016   CHOLHDL 4.0 10/22/2016   VLDL 27 10/22/2016   LDLCALC 145 (H) 10/22/2016   LDLCALC 100 10/21/2015   Lab Results  Component Value Date   TSH 1.724 10/22/2016   TSH 1.29 10/21/2015    Therapeutic Level Labs: No results found for: LITHIUM No results found for: VALPROATE No components found for:  CBMZ  Current Medications: Current Outpatient Medications  Medication Sig Dispense Refill  . acetaminophen (TYLENOL) 500 MG tablet Take 500 mg by mouth as needed.    Marland Kitchen aspirin EC 81 MG tablet Take 81 mg by mouth daily.    Marland Kitchen azelastine (ASTELIN) 0.1 % nasal spray Place 2 sprays into both nostrils 2 (two) times daily. Use in each nostril as directed (Patient taking differently: Place 2 sprays  into both nostrils 2 (two) times daily as needed for allergies. Use in each nostril as directed) 30 mL 4  . clonazePAM (KLONOPIN) 0.5 MG tablet Take 1 tablet (0.5 mg total) by mouth 3 (three) times daily as needed for anxiety. 90 tablet 2  . cloNIDine (CATAPRES) 0.2 MG tablet TAKE (1) TABLET BY MOUTH TWICE DAILY. 180 tablet 0  . cyclobenzaprine (FLEXERIL) 10 MG tablet TAKE (1) TABLET BY MOUTH AT BEDTIME. 30 tablet 3  . FLUoxetine (PROZAC) 20 MG capsule Take two in the morning and one in the afternoon 90 capsule 2  . gabapentin (NEURONTIN) 400 MG capsule Take 1 capsule (400 mg total) by mouth 3 (three) times daily. 90 capsule 2  . ibuprofen (ADVIL,MOTRIN) 600 MG tablet Take 1 tablet (600 mg total) by mouth every 8 (eight) hours as needed. 30 tablet 0  . Lactobacillus (DIGESTIVE HEALTH PROBIOTIC PO) Take 1 capsule by mouth as needed.     . milk thistle 175 MG tablet Take 175 mg by mouth daily.    . Multiple Vitamins-Minerals (MULTIVITAMIN ADULTS 50+) TABS Take 1 tablet by mouth daily.    Marland Kitchen olopatadine (PATANOL) 0.1 % ophthalmic solution Place 1 drop into both eyes 2 (two) times daily. 5 mL 2  . Omega-3 Fatty Acids (FISH OIL) 1000 MG CAPS Take 1 capsule by mouth daily.    Marland Kitchen omeprazole (PRILOSEC) 20 MG capsule Take  20 mg by mouth daily.    . ondansetron (ZOFRAN) 4 MG tablet One tablet once daily as needed, for uncontrolled nausea 7 tablet 0  . QUEtiapine (SEROQUEL) 50 MG tablet Take by mouth 1 tablet 3 times daily and 2 at bedtime 150 tablet 2  . senna (SENOKOT) 8.6 MG TABS tablet Take 1 tablet by mouth daily as needed for mild constipation.     No current facility-administered medications for this visit.      Musculoskeletal: Strength & Muscle Tone: decreased Gait & Station: unsteady Patient leans: N/A  Psychiatric Specialty Exam: Review of Systems  Gastrointestinal: Positive for abdominal pain.  Musculoskeletal: Positive for back pain and joint pain.  All other systems reviewed and are  negative.   Blood pressure 114/73, pulse 72, height 5\' 10"  (1.778 m), weight 166 lb (75.3 kg), SpO2 100 %.Body mass index is 23.82 kg/m.  General Appearance: Casual and Fairly Groomed  Eye Contact:  Good  Speech:  Clear and Coherent  Volume:  Normal  Mood:  Anxious  Affect:  Congruent  Thought Process:  Goal Directed  Orientation:  Full (Time, Place, and Person)  Thought Content: WDL   Suicidal Thoughts:  No  Homicidal Thoughts:  No  Memory:  Immediate;   Good Recent;   Good Remote;   Good  Judgement:  Good  Insight:  Fair  Psychomotor Activity:  Decreased  Concentration:  Concentration: Good and Attention Span: Good  Recall:  Good  Fund of Knowledge: Good  Language: Good  Akathisia:  No  Handed:  Right  AIMS (if indicated): not done  Assets:  Communication Skills Desire for Improvement Resilience Social Support Talents/Skills  ADL's:  Intact  Cognition: WNL  Sleep:  Good   Screenings: PHQ2-9     Clinical Support from 05/05/2017 in [facility] Office Visit from 01/13/2017 in [facility] Office Visit from 10/21/2015 in [facility]  PHQ-2 Total Score  1  0  4  PHQ-9 Total Score  5  -  14       Assessment and Plan: This patient is a 62 year old male with a history of depression and anxiety.  He is been having more physical ailments lately but he seems to be in a fairly good mood.  He will continue Prozac 10 mg every morning and 20 mg in the afternoon, clonazepam 0.5 mg 3 times daily as needed for anxiety, gabapentin 400 mg 3 times daily for anxiety Seroquel 50 mg 3 times daily and 100 mg at bedtime for mood stabilization.  He will return to see me in 3 months   [name], MD 12/15/2017, 11:41 AM

## 2017-12-16 ENCOUNTER — Telehealth: Payer: Self-pay | Admitting: Family Medicine

## 2017-12-16 NOTE — Telephone Encounter (Signed)
Manual, [place]  to Patient Appointment Schedule Request Pool        12/15/17 3:53 PM  Appointment Request From: Marc [name]   With Provider: Tula Nakayama, MD [facility]]   Preferred Date Range: Any   Preferred Times: Any time   Reason for visit: Request an Appointment   Comments:  I have a lingering sprain small fracture or bone bruise . I fell And yes my dog helped cause the injury. Happened Sunday before last, results were softball size hand and wrist swelling, excruciating pain, immobility,, swelling and pain subsided around last Saturday last few days pain has increased with discoloration  Can you see me, Iike x-rays are needed, but do not want to go to [facility] and wait all night long , Marc [name], [birth year], [phone], thanks   **please let us know what we should do, patient states he wants an xray before he is referred to orthopedic, however, orthopedic has xray.

## 2017-12-16 NOTE — Telephone Encounter (Signed)
pls see the message I sent, Iam not doing any xrays, pls see the  Message I sent,the pt

## 2017-12-16 NOTE — Telephone Encounter (Signed)
I can tell him that he cannot get any xrays without being seen, or do you want to refer him to ortho to do xrays there?

## 2017-12-18 DIAGNOSIS — S63502A Unspecified sprain of left wrist, initial encounter: Secondary | ICD-10-CM | POA: Diagnosis not present

## 2017-12-29 ENCOUNTER — Other Ambulatory Visit: Payer: Self-pay

## 2017-12-29 ENCOUNTER — Encounter (HOSPITAL_COMMUNITY): Payer: Self-pay

## 2017-12-29 ENCOUNTER — Emergency Department (HOSPITAL_COMMUNITY): Payer: Medicare HMO

## 2017-12-29 ENCOUNTER — Observation Stay (HOSPITAL_COMMUNITY)
Admission: EM | Admit: 2017-12-29 | Discharge: 2017-12-30 | Disposition: A | Discharge status: Home / Self Care | Payer: Medicare HMO | Attending: Internal Medicine | Admitting: Internal Medicine

## 2017-12-29 DIAGNOSIS — G894 Chronic pain syndrome: Secondary | ICD-10-CM | POA: Insufficient documentation

## 2017-12-29 DIAGNOSIS — R410 Disorientation, unspecified: Secondary | ICD-10-CM | POA: Diagnosis present

## 2017-12-29 DIAGNOSIS — F419 Anxiety disorder, unspecified: Secondary | ICD-10-CM | POA: Insufficient documentation

## 2017-12-29 DIAGNOSIS — R4182 Altered mental status, unspecified: Secondary | ICD-10-CM | POA: Diagnosis not present

## 2017-12-29 DIAGNOSIS — F32A Depression, unspecified: Secondary | ICD-10-CM | POA: Diagnosis present

## 2017-12-29 DIAGNOSIS — I10 Essential (primary) hypertension: Secondary | ICD-10-CM | POA: Insufficient documentation

## 2017-12-29 DIAGNOSIS — I6789 Other cerebrovascular disease: Secondary | ICD-10-CM | POA: Diagnosis not present

## 2017-12-29 DIAGNOSIS — G9340 Encephalopathy, unspecified: Secondary | ICD-10-CM

## 2017-12-29 DIAGNOSIS — K219 Gastro-esophageal reflux disease without esophagitis: Secondary | ICD-10-CM | POA: Diagnosis not present

## 2017-12-29 DIAGNOSIS — F329 Major depressive disorder, single episode, unspecified: Secondary | ICD-10-CM | POA: Insufficient documentation

## 2017-12-29 DIAGNOSIS — F4489 Other dissociative and conversion disorders: Secondary | ICD-10-CM | POA: Diagnosis not present

## 2017-12-29 DIAGNOSIS — R270 Ataxia, unspecified: Secondary | ICD-10-CM | POA: Diagnosis not present

## 2017-12-29 HISTORY — DX: Encephalopathy, unspecified: G93.40

## 2017-12-29 LAB — DIFFERENTIAL
BASOS ABS: 0 10*3/uL (ref 0.0–0.1)
Basophils Relative: 0 %
Eosinophils Absolute: 0 10*3/uL (ref 0.0–0.7)
Eosinophils Relative: 0 %
LYMPHS ABS: 0.9 10*3/uL (ref 0.7–4.0)
LYMPHS PCT: 19 %
MONOS PCT: 5 %
Monocytes Absolute: 0.3 10*3/uL (ref 0.1–1.0)
NEUTROS PCT: 76 %
Neutro Abs: 3.5 10*3/uL (ref 1.7–7.7)

## 2017-12-29 LAB — I-STAT CHEM 8, ED
BUN: 19 mg/dL (ref 6–20)
CREATININE: 1 mg/dL (ref 0.61–1.24)
Calcium, Ion: 1.27 mmol/L (ref 1.15–1.40)
Chloride: 116 mmol/L — ABNORMAL HIGH (ref 101–111)
GLUCOSE: 128 mg/dL — AB (ref 65–99)
HEMATOCRIT: 37 % — AB (ref 39.0–52.0)
Hemoglobin: 12.6 g/dL — ABNORMAL LOW (ref 13.0–17.0)
Potassium: 4 mmol/L (ref 3.5–5.1)
Sodium: 146 mmol/L — ABNORMAL HIGH (ref 135–145)
TCO2: 18 mmol/L — AB (ref 22–32)

## 2017-12-29 LAB — APTT: APTT: 29 s (ref 24–36)

## 2017-12-29 LAB — I-STAT CG4 LACTIC ACID, ED: Lactic Acid, Venous: 1.35 mmol/L (ref 0.5–1.9)

## 2017-12-29 LAB — CBC
HEMATOCRIT: 38.1 % — AB (ref 39.0–52.0)
HEMOGLOBIN: 13.1 g/dL (ref 13.0–17.0)
MCH: 31.9 pg (ref 26.0–34.0)
MCHC: 34.4 g/dL (ref 30.0–36.0)
MCV: 92.7 fL (ref 78.0–100.0)
Platelets: 163 10*3/uL (ref 150–400)
RBC: 4.11 MIL/uL — AB (ref 4.22–5.81)
RDW: 13.3 % (ref 11.5–15.5)
WBC: 4.6 10*3/uL (ref 4.0–10.5)

## 2017-12-29 LAB — COMPREHENSIVE METABOLIC PANEL
ALBUMIN: 3.7 g/dL (ref 3.5–5.0)
ALK PHOS: 101 U/L (ref 38–126)
ALT: 31 U/L (ref 17–63)
AST: 28 U/L (ref 15–41)
Anion gap: 12 (ref 5–15)
BILIRUBIN TOTAL: 0.8 mg/dL (ref 0.3–1.2)
BUN: 21 mg/dL — ABNORMAL HIGH (ref 6–20)
CALCIUM: 9.9 mg/dL (ref 8.9–10.3)
CO2: 19 mmol/L — ABNORMAL LOW (ref 22–32)
CREATININE: 1.16 mg/dL (ref 0.61–1.24)
Chloride: 113 mmol/L — ABNORMAL HIGH (ref 101–111)
GFR calc Af Amer: >60 mL/min (ref >60)
GFR calc non Af Amer: >60 mL/min (ref >60)
GLUCOSE: 130 mg/dL — AB (ref 65–99)
Potassium: 3.9 mmol/L (ref 3.5–5.1)
Sodium: 144 mmol/L (ref 135–145)
TOTAL PROTEIN: 7.7 g/dL (ref 6.5–8.1)

## 2017-12-29 LAB — I-STAT TROPONIN, ED: Troponin i, poc: 0 ng/mL (ref 0.00–0.08)

## 2017-12-29 LAB — PROTIME-INR
INR: 1.11
Prothrombin Time: 14.2 seconds (ref 11.4–15.2)

## 2017-12-29 LAB — ETHANOL: Alcohol, Ethyl (B): <10 mg/dL (ref <10)

## 2017-12-29 MED ORDER — IOPAMIDOL (ISOVUE-370) INJECTION 76%
100.0000 mL | Freq: Once | INTRAVENOUS | Status: AC | PRN
  Administered 2017-12-29: 100 mL via INTRAVENOUS

## 2017-12-29 NOTE — ED Notes (Signed)
Tele Neuro at bedside 
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
## 2017-12-29 NOTE — H&P (Addendum)
History and Physical    Marc [name] GMW:[id number] DOB: [birth year] DOA: 12/29/2017  PCP: Fayrene Helper, MD   Patient coming from: Home via EMS  Chief Complaint: Confusion/found in car   HPI: Marc [name] is a 62 y.o. male with medical history significant for essential hypertension, anxiety/depression, and chronic pain syndrome who was found unconscious in his car by a concerned neighbor.  The neighbor called EMS who found the patient on the passenger side of the car in the vehicle with the engine off. He was noted to have blood glucose readings in the normal range. He was arousable to verbal commands and tactile stimulation and appeared to be quite confused and could not remember where he was or the date/time.  He was able to remember his name and was noted to be ambulatory.  He denied any intentions to harm himself and was transported to the ED for further evaluation.   ED Course: Vital signs are noted to be stable with some mild blood pressure elevation.  He underwent a stroke work-up with CT of the head as well as CTA per tele-neurology recommendations with no acute findings noted.  There is some slight progression of atrophy and enlargement of the ventricles noted on CT head.  Laboratory data otherwise demonstrates some mild stable anemia and borderline hypernatremia.  He continues to have inappropriate responses to questioning and cannot state where he is or the date/time accurately.  He appears to deny any other complaints or concerns.  There are unfortunately no family members at the bedside.  Review of Systems: Cannot be obtained as patient is currently a poor historian.  Past Medical History:  Diagnosis Date  . Allergy   . Anxiety   . Arthritis   . Back problem   . Depression   . GERD (gastroesophageal reflux disease)   . Hepatitis C 1979  . Hepatitis C reactive    LIVER BX 2008-CHRONIC ACTIVE HEPATITIS  . HTN (hypertension)   . Hx of adenomatous colonic polyps 2008   due for surveillance 2017  . Hypercholesterolemia   . IBS (irritable bowel syndrome)   . Knee pain   . Leukopenia 11/06/2015  . Normal cardiac stress test 05/25/2011   [facility] [place]  . Normal echocardiogram 05/25/2011   EF 55%, mild TR  . Substance abuse ([place])    narcotic addiction  . Thrombocytopenia ([place]) 11/06/2015    Past Surgical History:  Procedure Laterality Date  . APPENDECTOMY    . CARPAL TUNNEL RELEASE     rt  . COLONOSCOPY  2008 [facility] ARS D100 V8 PHEN 12.5   2 SIMPLE ADENOMAS (< 1 CM)  . ESOPHAGOGASTRODUODENOSCOPY  04/26/2012   Dr. Oneida Alar: Non-erosive gastritis (inflammation) was found in the gastric antrum but no H.pylori; multiple biopsies (duodenal bx negative for Celiac)/ The mucosa of the esophagus appeared normal  . EUS  09/08/2012   Dr. Ardis Hughs: CBD dilated but no stones. Query secondary to Sphincter of Oddi stenosis, ?dysfunction, but clinically without symptoms  . HARDWARE REMOVAL Right 02/12/2014   Procedure: HARDWARE REMOVAL;  Surgeon: Jolyn Nap, MD;  Location: [facility];  Service: Orthopedics;  Laterality: Right;  . KNEE ARTHROSCOPY    . Neurostimulator implant    . right ring finger    . spinal stenosis, had screws put in neck  04/11/2009   DR KRITZER  . TONSILLECTOMY    . ULNA OSTEOTOMY Right 02/12/2014   Procedure: RIGHT ULNAR SHORTENING AND OSTEOTOMY ;  Surgeon: Jolyn Nap, MD;  Location: [facility];  Service: Orthopedics;  Laterality: Right;  . ULNAR NERVE TRANSPOSITION    . UPPER GASTROINTESTINAL ENDOSCOPY  2008 [facility] ABD PAIN WEIGHT LOSS d100 v8 phen 12.5   NL  . WRIST SURGERY Right jan 2015   Dr. Edmonia Lynch     reports that he quit smoking about 31 years ago. His smoking use included cigarettes. He has a 12.00 pack-year smoking history. He has never used smokeless tobacco. He reports that he does not drink alcohol or use drugs.  Allergies  Allergen Reactions  . Levofloxacin Nausea Only and  Other (See Comments)    "Creepy" feeling, skin didn't feel right, sort of itchy.  . Ciprofloxacin     Other reaction(s): Sweating (intolerance)  . Sulfa Antibiotics     Family History  Problem Relation Age of Onset  . Bladder Cancer Mother        rare form   . Cancer Sister        Metastatic abdominal  . Emotional abuse Sister   . Stroke Sister   . Emotional abuse Sister   . Heart failure Father   . Stroke Father   . Alcohol abuse Father   . Dementia Paternal Aunt   . Alcohol abuse Paternal Uncle   . Dementia Paternal Uncle   . Dementia Paternal Grandmother   . Stroke Paternal Grandmother   . ADD / ADHD Cousin   . Bipolar disorder Cousin   . Anxiety disorder Cousin   . Depression Cousin        Committed suicide  . Alcohol abuse Cousin   . OCD Cousin   . Paranoid behavior Cousin   . Brain cancer Maternal Grandfather   . Heart defect Unknown        FAMILY HX  . Colon cancer Maternal Uncle   . Colon polyps Neg Hx   . Drug abuse Neg Hx   . Schizophrenia Neg Hx   . Seizures Neg Hx   . Sexual abuse Neg Hx   . Physical abuse Neg Hx     Prior to Admission medications   Medication Sig Start Date End Date Taking? Authorizing Provider  acetaminophen (TYLENOL) 500 MG tablet Take 500 mg by mouth as needed.    [provider]  aspirin EC 81 MG tablet Take 81 mg by mouth daily.    [provider]  azelastine (ASTELIN) 0.1 % nasal spray Place 2 sprays into both nostrils 2 (two) times daily. Use in each nostril as directed Patient taking differently: Place 2 sprays into both nostrils 2 (two) times daily as needed for allergies. Use in each nostril as directed 01/13/17   Fayrene Helper, MD  clonazePAM (KLONOPIN) 0.5 MG tablet Take 1 tablet (0.5 mg total) by mouth 3 (three) times daily as needed for anxiety. 12/15/17 12/15/18  Cloria Spring, MD  cloNIDine (CATAPRES) 0.2 MG tablet TAKE (1) TABLET BY MOUTH TWICE DAILY. 10/11/17   Fayrene Helper, MD    cyclobenzaprine (FLEXERIL) 10 MG tablet TAKE (1) TABLET BY MOUTH AT BEDTIME. 04/13/17   Fayrene Helper, MD  FLUoxetine (PROZAC) 20 MG capsule Take two in the morning and one in the afternoon 12/15/17   Cloria Spring, MD  gabapentin (NEURONTIN) 400 MG capsule Take 1 capsule (400 mg total) by mouth 3 (three) times daily. 12/15/17 12/15/18  Cloria Spring, MD  ibuprofen (ADVIL,MOTRIN) 600 MG tablet Take 1 tablet (600 mg total)  by mouth every 8 (eight) hours as needed. 08/12/17   Fayrene Helper, MD  Lactobacillus (DIGESTIVE HEALTH PROBIOTIC PO) Take 1 capsule by mouth as needed.     [provider]  milk thistle 175 MG tablet Take 175 mg by mouth daily.    [provider]  Multiple Vitamins-Minerals (MULTIVITAMIN ADULTS 50+) TABS Take 1 tablet by mouth daily.    [provider]  olopatadine (PATANOL) 0.1 % ophthalmic solution Place 1 drop into both eyes 2 (two) times daily. 01/13/17   Fayrene Helper, MD  Omega-3 Fatty Acids (FISH OIL) 1000 MG CAPS Take 1 capsule by mouth daily.    [provider]  omeprazole (PRILOSEC) 20 MG capsule Take 20 mg by mouth daily.    [provider]  ondansetron (ZOFRAN) 4 MG tablet One tablet once daily as needed, for uncontrolled nausea 08/11/17   Fayrene Helper, MD  QUEtiapine (SEROQUEL) 50 MG tablet Take by mouth 1 tablet 3 times daily and 2 at bedtime 12/15/17   Cloria Spring, MD  senna (SENOKOT) 8.6 MG TABS tablet Take 1 tablet by mouth daily as needed for mild constipation.    [provider]    Physical Exam: Vitals:   12/29/17 2230 12/29/17 2300 12/29/17 2325 12/29/17 2330  BP: (!) 169/79 (!) 174/93  (!) 156/77  Pulse: 68 96  73  Resp: 20 12  17   Temp:   97.6 F (36.4 C)   TempSrc:      SpO2: 96% 100%  96%  Weight:      Height:        Constitutional: NAD, calm, comfortable, confused Vitals:   12/29/17 2230 12/29/17 2300 12/29/17 2325 12/29/17 2330  BP: (!) 169/79 (!) 174/93  (!) 156/77   Pulse: 68 96  73  Resp: 20 12  17   Temp:   97.6 F (36.4 C)   TempSrc:      SpO2: 96% 100%  96%  Weight:      Height:       Eyes: lids and conjunctivae normal ENMT: Mucous membranes are moist.  Neck: normal, supple Respiratory: clear to auscultation bilaterally. Normal respiratory effort. No accessory muscle use.  Cardiovascular: Regular rate and rhythm, no murmurs. No extremity edema. Abdomen: no tenderness, no distention. Bowel sounds positive.  Musculoskeletal:  No joint deformity upper and lower extremities.   Skin: no rashes, lesions, ulcers.  Psychiatric: Alert and oriented x1 to person only  Labs on Admission: I have personally reviewed following labs and imaging studies  CBC: Recent Labs  Lab 12/29/17 2209 12/29/17 2229  WBC 4.6  --   NEUTROABS 3.5  --   HGB 13.1 12.6*  HCT 38.1* 37.0*  MCV 92.7  --   PLT 163  --    Basic Metabolic Panel: Recent Labs  Lab 12/29/17 2209 12/29/17 2229  NA 144 146*  K 3.9 4.0  CL 113* 116*  CO2 19*  --   GLUCOSE 130* 128*  BUN 21* 19  CREATININE 1.16 1.00  CALCIUM 9.9  --    GFR: Estimated Creatinine Clearance: 72.2 mL/min (by C-G formula based on SCr of 1 mg/dL). Liver Function Tests: Recent Labs  Lab 12/29/17 2209  AST 28  ALT 31  ALKPHOS 101  BILITOT 0.8  PROT 7.7  ALBUMIN 3.7   No results for input(s): LIPASE, AMYLASE in the last 168 hours. No results for input(s): AMMONIA in the last 168 hours. Coagulation Profile: Recent Labs  Lab  12/29/17 2209  INR 1.11   Cardiac Enzymes: No results for input(s): CKTOTAL, CKMB, CKMBINDEX, TROPONINI in the last 168 hours. BNP (last 3 results) No results for input(s): PROBNP in the last 8760 hours. HbA1C: No results for input(s): HGBA1C in the last 72 hours. CBG: No results for input(s): GLUCAP in the last 168 hours. Lipid Profile: No results for input(s): CHOL, HDL, LDLCALC, TRIG, CHOLHDL, LDLDIRECT in the last 72 hours. Thyroid Function Tests: No results for  input(s): TSH, T4TOTAL, FREET4, T3FREE, THYROIDAB in the last 72 hours. Anemia Panel: No results for input(s): VITAMINB12, FOLATE, FERRITIN, TIBC, IRON, RETICCTPCT in the last 72 hours. Urine analysis:    Component Value Date/Time   COLORURINE YELLOW 06/05/2007 1615   APPEARANCEUR CLEAR 06/05/2007 1615   LABSPEC 1.010 06/05/2007 1615   PHURINE 5.5 06/05/2007 1615   GLUCOSEU NEGATIVE 06/05/2007 1615   HGBUR NEGATIVE 06/05/2007 [place] 06/05/2007 [place] 06/05/2007 1615   PROTEINUR NEGATIVE 06/05/2007 1615   UROBILINOGEN 0.2 06/05/2007 1615   NITRITE NEGATIVE 06/05/2007 1615   LEUKOCYTESUR  06/05/2007 1615    NEGATIVE MICROSCOPIC NOT DONE ON URINES WITH NEGATIVE PROTEIN, BLOOD, LEUKOCYTES, NITRITE, OR GLUCOSE <1000 mg/dL.    Radiological Exams on Admission: Ct Angio Head W Or Wo Contrast  Result Date: 12/29/2017 CLINICAL DATA:  Ataxia, confusion.  Suspect stroke. EXAM: CT ANGIOGRAPHY HEAD AND NECK TECHNIQUE: Multidetector CT imaging of the head and neck was performed using the standard protocol during bolus administration of intravenous contrast. Multiplanar CT image reconstructions and MIPs were obtained to evaluate the vascular anatomy. Carotid stenosis measurements (when applicable) are obtained utilizing NASCET criteria, using the distal internal carotid diameter as the denominator. CONTRAST:  154mL ISOVUE-370 IOPAMIDOL (ISOVUE-370) INJECTION 76% COMPARISON:  CT HEAD Dec 29, 2017 FINDINGS: CTA NECK FINDINGS: AORTIC ARCH: Normal appearance of the thoracic arch, normal branch pattern. Mild calcific atherosclerosis. The origins of the innominate, left Common carotid artery and subclavian artery are patent, streak artifact from retained subclavian venous contrast. RIGHT CAROTID SYSTEM: Common carotid artery is patent. Mild calcific atherosclerosis of the carotid bifurcation without hemodynamically significant stenosis by NASCET criteria. Normal appearance  of the internal carotid artery. LEFT CAROTID SYSTEM: Common carotid artery is patent. Mild calcific atherosclerosis of the carotid bifurcation without hemodynamically significant stenosis by NASCET criteria. Normal appearance of the internal carotid artery. VERTEBRAL ARTERIES:Left vertebral artery is dominant. Patent vertebral artery's, mild extrinsic compression from degenerative cervical spine. SKELETON: No acute osseous process though bone windows have not been submitted. Status post C5-6 fusion with arthrodesis. OTHER NECK: Soft tissues of the neck are nonacute though, not tailored for evaluation. UPPER CHEST: Included lung apices are clear. No superior mediastinal lymphadenopathy. CTA HEAD FINDINGS: ANTERIOR CIRCULATION: Patent cervical internal carotid arteries, petrous, cavernous and supra clinoid internal carotid arteries. Patent anterior communicating artery. Patent anterior and middle cerebral arteries. No large vessel occlusion, significant stenosis, contrast extravasation or aneurysm. POSTERIOR CIRCULATION: Patent vertebral arteries, vertebrobasilar junction and basilar artery, as well as main branch vessels. Patent posterior cerebral arteries. No large vessel occlusion, significant stenosis, contrast extravasation or aneurysm. VENOUS SINUSES: Major dural venous sinuses are patent though not tailored for evaluation on this angiographic examination. ANATOMIC VARIANTS: None. DELAYED PHASE: No abnormal intracranial enhancement. MIP images reviewed. IMPRESSION: CTA NECK: No hemodynamically significant stenosis or acute vascular process. CTA HEAD: No emergent large vessel occlusion or flow limiting stenosis. Electronically Signed   By: Elon Alas M.D.   On: 12/29/2017 23:15  Ct Head Wo Contrast  Result Date: 12/29/2017 CLINICAL DATA:  Confusion EXAM: CT HEAD WITHOUT CONTRAST TECHNIQUE: Contiguous axial images were obtained from the base of the skull through the vertex without intravenous contrast.  COMPARISON:  Brain MRI 10/03/2009 FINDINGS: Brain: No acute territorial infarction, hemorrhage or intracranial mass. Moderate atrophy, slight progression since 2011. Mild small vessel ischemic changes of the white matter. Slight interval ventricular enlargement. Vascular: No hyperdense vessels. Scattered calcifications at the carotid siphons Skull: No fracture Sinuses/Orbits: No acute finding. Other: None IMPRESSION: 1. No definite CT evidence for acute intracranial abnormality. 2. Slight progression of atrophy. Enlarged ventricles, slight increased compared to prior, probably due to atrophy. Small amount of small vessel ischemic change of the white matter Electronically Signed   By: Donavan Foil M.D.   On: 12/29/2017 22:28   Ct Angio Neck W Or Wo Contrast  Result Date: 12/29/2017 CLINICAL DATA:  Ataxia, confusion.  Suspect stroke. EXAM: CT ANGIOGRAPHY HEAD AND NECK TECHNIQUE: Multidetector CT imaging of the head and neck was performed using the standard protocol during bolus administration of intravenous contrast. Multiplanar CT image reconstructions and MIPs were obtained to evaluate the vascular anatomy. Carotid stenosis measurements (when applicable) are obtained utilizing NASCET criteria, using the distal internal carotid diameter as the denominator. CONTRAST:  152mL ISOVUE-370 IOPAMIDOL (ISOVUE-370) INJECTION 76% COMPARISON:  CT HEAD Dec 29, 2017 FINDINGS: CTA NECK FINDINGS: AORTIC ARCH: Normal appearance of the thoracic arch, normal branch pattern. Mild calcific atherosclerosis. The origins of the innominate, left Common carotid artery and subclavian artery are patent, streak artifact from retained subclavian venous contrast. RIGHT CAROTID SYSTEM: Common carotid artery is patent. Mild calcific atherosclerosis of the carotid bifurcation without hemodynamically significant stenosis by NASCET criteria. Normal appearance of the internal carotid artery. LEFT CAROTID SYSTEM: Common carotid artery is patent.  Mild calcific atherosclerosis of the carotid bifurcation without hemodynamically significant stenosis by NASCET criteria. Normal appearance of the internal carotid artery. VERTEBRAL ARTERIES:Left vertebral artery is dominant. Patent vertebral artery's, mild extrinsic compression from degenerative cervical spine. SKELETON: No acute osseous process though bone windows have not been submitted. Status post C5-6 fusion with arthrodesis. OTHER NECK: Soft tissues of the neck are nonacute though, not tailored for evaluation. UPPER CHEST: Included lung apices are clear. No superior mediastinal lymphadenopathy. CTA HEAD FINDINGS: ANTERIOR CIRCULATION: Patent cervical internal carotid arteries, petrous, cavernous and supra clinoid internal carotid arteries. Patent anterior communicating artery. Patent anterior and middle cerebral arteries. No large vessel occlusion, significant stenosis, contrast extravasation or aneurysm. POSTERIOR CIRCULATION: Patent vertebral arteries, vertebrobasilar junction and basilar artery, as well as main branch vessels. Patent posterior cerebral arteries. No large vessel occlusion, significant stenosis, contrast extravasation or aneurysm. VENOUS SINUSES: Major dural venous sinuses are patent though not tailored for evaluation on this angiographic examination. ANATOMIC VARIANTS: None. DELAYED PHASE: No abnormal intracranial enhancement. MIP images reviewed. IMPRESSION: CTA NECK: No hemodynamically significant stenosis or acute vascular process. CTA HEAD: No emergent large vessel occlusion or flow limiting stenosis. Electronically Signed   By: Elon Alas M.D.   On: 12/29/2017 23:15    EKG: Independently reviewed. SR at 78bpm.  Assessment/Plan Principal Problem:   Acute encephalopathy Active Problems:   Anxiety and depression   GERD   Essential hypertension   Chronic pain syndrome    1. Acute encephalopathy.  This appears to be currently of unknown cause. Blood alcohol levels are  low.  Will check UDS as well as TSH, serum B12, and RPR.  Maintain on thiamine. Brain MRI  for further evaluation as well as neurology consultation for further assessment for potential degenerative brain disease.  May consider TTS evaluation if there are no significant medical findings.  Monitor with routine neurochecks and Ativan as needed for agitation. 2. Anxiety/depression.  Patient does not have any active features of anxiety or depression currently but will maintain on home medications to include Prozac, Klonopin, and Seroquel. 3. Essential hypertension.  Currently with some blood pressure elevations noted.  It is not clear whether or not he is compliant with home clonidine and may have missed some doses.  Continue clonidine with hydralazine as back-up. 4. GERD.  Continue PPI. 5. Chronic pain syndrome.  Continue with gabapentin, ibuprofen, and cyclobenzaprine as needed.  He is currently alert and awake with no somnolence.   DVT prophylaxis: Lovenox Code Status: Full Family Communication: None at bedside Disposition Plan:Further evaluation of encephalopathy Consults called:Neurology  Admission status: Obs, MedSurg   Tavious Griesinger Darleen Crocker DO [facility] Pager [phone]  If 7PM-7AM, please contact night-coverage [email or web address] Password [facility]  12/29/2017, 11:46 PM

## 2017-12-29 NOTE — Consult Note (Signed)
[facility] TeleNeurology Consult Services  Patient was informed the Neurology Consult would happen via telehealth (remote video) and consented to receiving care in this manner.  Impression: Encephalopathy - patient presents to the ED after he was found confused in his car. Upon arrival to the ED no motor deficits were noted but patient had difficulty naming. LSN is unknown thus a CTA head and neck was performed to look for LVO but it was negative. Etiology of confusion is unknown at this time. DDx includes degenerative brain disease vs toxic/metabolic encephalopathy.   Comments:  [facility] contacted:23:05  [facility] at bedside: 23:06   Recommendations: Admit for further work-up Further inpatient evaluation as per Neurology/ Internal Medicine Discussed with ED MD  History of Present Illness  Patient is a 62 years old man with no known medical history who presents to the ED after he was found confused in his car on the passenger's side. Limited is very history at this time. On arrival to the ED, he appears confused, unable to name objects. LSN is unknown.  Exam  NIHSS score: 2 1A: Level of Consciousness - Alert; keenly responsive 0 1B: Ask Month and Age - 1 Question Right +1 1C: 'Blink Eyes' & 'Squeeze Hands' - Performs Both Tasks 0 2: Test Horizontal Extraocular Movements - Normal 0 3: Test Visual Fields - No Visual Loss 0 4: Test Facial Palsy - Normal symmetry 0 5A: Test Left Arm Motor Drift - No Drift for 10 Seconds 0 5B: Test Right Arm Motor Drift - No Drift for 10 Seconds 0 6A: Test Left Leg Motor Drift - No Drift for 5 Seconds 0 6B: Test Right Leg Motor Drift - No Drift for 5 Seconds 0 7: Test Limb Ataxia - No Ataxia 0 8: Test Sensation - Normal; No sensory loss 0 9: Test Language/Aphasia - Mild-Moderate Aphasia: Some Obvious Changes, Without Significant Limitation +1 10: Test Dysarthria - Normal 0 11: Test Extinction/Inattention - No abnormality 0  Non  contrast CTH: no acute intracranial abnormalities CTA: no LVO, no significant stenosis  Medical Decision Making:  - Extensive number of diagnosis or management options are considered above.  - Extensive amount of complex data reviewed.  - High risk of complication and/or morbidity or mortality are associated with differential diagnostic considerations above.  - There may be Uncertain outcome and increased probability of prolonged functional impairment or high probability of severe prolonged functional impairment associated with some of these differential diagnosis.  Medical Data Reviewed:  1.Data reviewed include clinical labs, radiology, Medical Tests;  2.Tests results discussed w/performing or interpreting physician;  3.Obtaining/reviewing old medical records;  4.Obtaining case history from another source;  5.Independent review of image, tracing or specimen.

## 2017-12-29 NOTE — ED Triage Notes (Signed)
Per ems pt was found sitting in car at home. Pt confused on arrival only knowing name. Pt ambulatory to stretcher.

## 2017-12-30 ENCOUNTER — Encounter (HOSPITAL_COMMUNITY): Payer: Self-pay

## 2017-12-30 ENCOUNTER — Observation Stay (HOSPITAL_COMMUNITY): Payer: Medicare HMO

## 2017-12-30 DIAGNOSIS — G9340 Encephalopathy, unspecified: Principal | ICD-10-CM

## 2017-12-30 LAB — CBC
HCT: 38 % — ABNORMAL LOW (ref 39.0–52.0)
HEMATOCRIT: 39.2 % (ref 39.0–52.0)
HEMOGLOBIN: 13.4 g/dL (ref 13.0–17.0)
Hemoglobin: 12.7 g/dL — ABNORMAL LOW (ref 13.0–17.0)
MCH: 31.3 pg (ref 26.0–34.0)
MCH: 31.8 pg (ref 26.0–34.0)
MCHC: 33.4 g/dL (ref 30.0–36.0)
MCHC: 34.2 g/dL (ref 30.0–36.0)
MCV: 92.9 fL (ref 78.0–100.0)
MCV: 93.6 fL (ref 78.0–100.0)
PLATELETS: 167 10*3/uL (ref 150–400)
Platelets: 178 10*3/uL (ref 150–400)
RBC: 4.06 MIL/uL — ABNORMAL LOW (ref 4.22–5.81)
RBC: 4.22 MIL/uL (ref 4.22–5.81)
RDW: 13.3 % (ref 11.5–15.5)
RDW: 13.3 % (ref 11.5–15.5)
WBC: 4.3 10*3/uL (ref 4.0–10.5)
WBC: 5.2 10*3/uL (ref 4.0–10.5)

## 2017-12-30 LAB — BASIC METABOLIC PANEL
Anion gap: 8 (ref 5–15)
BUN: 18 mg/dL (ref 6–20)
CALCIUM: 9.5 mg/dL (ref 8.9–10.3)
CO2: 24 mmol/L (ref 22–32)
CREATININE: 1.06 mg/dL (ref 0.61–1.24)
Chloride: 112 mmol/L — ABNORMAL HIGH (ref 101–111)
GFR calc Af Amer: >60 mL/min (ref >60)
Glucose, Bld: 120 mg/dL — ABNORMAL HIGH (ref 65–99)
Potassium: 3.7 mmol/L (ref 3.5–5.1)
SODIUM: 144 mmol/L (ref 135–145)

## 2017-12-30 LAB — RAPID URINE DRUG SCREEN, HOSP PERFORMED
Amphetamines: NOT DETECTED
Barbiturates: NOT DETECTED
Benzodiazepines: NOT DETECTED
Cocaine: NOT DETECTED
Opiates: NOT DETECTED
Tetrahydrocannabinol: NOT DETECTED

## 2017-12-30 LAB — CREATININE, SERUM
Creatinine, Ser: 1.11 mg/dL (ref 0.61–1.24)
GFR calc non Af Amer: >60 mL/min (ref >60)

## 2017-12-30 LAB — HEMOGLOBIN A1C
HEMOGLOBIN A1C: 5.5 % (ref 4.8–5.6)
MEAN PLASMA GLUCOSE: 111.15 mg/dL

## 2017-12-30 LAB — TSH: TSH: 2.056 u[IU]/mL (ref 0.350–4.500)

## 2017-12-30 LAB — GLUCOSE, CAPILLARY: GLUCOSE-CAPILLARY: 135 mg/dL — AB (ref 65–99)

## 2017-12-30 LAB — VITAMIN B12: VITAMIN B 12: 530 pg/mL (ref 180–914)

## 2017-12-30 MED ORDER — QUETIAPINE FUMARATE 25 MG PO TABS
50.0000 mg | ORAL_TABLET | Freq: Three times a day (TID) | ORAL | Status: DC
  Administered 2017-12-30: 50 mg via ORAL
  Filled 2017-12-30: qty 2

## 2017-12-30 MED ORDER — SODIUM CHLORIDE 0.9 % IV SOLN: 250.0000 mL | INTRAVENOUS | Status: DC | PRN

## 2017-12-30 MED ORDER — IBUPROFEN 400 MG PO TABS: 600.0000 mg | ORAL_TABLET | Freq: Three times a day (TID) | ORAL | Status: DC | PRN

## 2017-12-30 MED ORDER — SODIUM CHLORIDE 0.9% FLUSH
3.0000 mL | Freq: Two times a day (BID) | INTRAVENOUS | Status: DC
  Administered 2017-12-30: 3 mL via INTRAVENOUS

## 2017-12-30 MED ORDER — VITAMIN B-1 100 MG PO TABS
100.0000 mg | ORAL_TABLET | Freq: Every day | ORAL | Status: DC
  Administered 2017-12-30: 100 mg via ORAL
  Filled 2017-12-30: qty 1

## 2017-12-30 MED ORDER — OLOPATADINE HCL 0.1 % OP SOLN
1.0000 [drp] | Freq: Two times a day (BID) | OPHTHALMIC | Status: DC
  Administered 2017-12-30: 1 [drp] via OPHTHALMIC
  Filled 2017-12-30: qty 5

## 2017-12-30 MED ORDER — ENOXAPARIN SODIUM 40 MG/0.4ML ~~LOC~~ SOLN
40.0000 mg | SUBCUTANEOUS | Status: DC
  Administered 2017-12-30: 40 mg via SUBCUTANEOUS
  Filled 2017-12-30: qty 0.4

## 2017-12-30 MED ORDER — LORAZEPAM 2 MG/ML IJ SOLN
2.0000 mg | INTRAMUSCULAR | Status: DC | PRN
  Filled 2017-12-30: qty 1

## 2017-12-30 MED ORDER — FLUOXETINE HCL 20 MG PO CAPS
20.0000 mg | ORAL_CAPSULE | Freq: Every day | ORAL | Status: DC
  Administered 2017-12-30: 20 mg via ORAL
  Filled 2017-12-30: qty 1

## 2017-12-30 MED ORDER — OMEGA-3-ACID ETHYL ESTERS 1 G PO CAPS
1.0000 g | ORAL_CAPSULE | Freq: Every day | ORAL | Status: DC
  Administered 2017-12-30: 1 g via ORAL
  Filled 2017-12-30: qty 1

## 2017-12-30 MED ORDER — ACETAMINOPHEN 325 MG PO TABS: 650.0000 mg | ORAL_TABLET | Freq: Four times a day (QID) | ORAL | Status: DC | PRN

## 2017-12-30 MED ORDER — ASPIRIN EC 81 MG PO TBEC
81.0000 mg | DELAYED_RELEASE_TABLET | Freq: Every day | ORAL | Status: DC
  Administered 2017-12-30: 81 mg via ORAL
  Filled 2017-12-30: qty 1

## 2017-12-30 MED ORDER — CLONAZEPAM 0.5 MG PO TABS: 0.5000 mg | ORAL_TABLET | Freq: Three times a day (TID) | ORAL | Status: DC | PRN

## 2017-12-30 MED ORDER — PANTOPRAZOLE SODIUM 40 MG PO TBEC
40.0000 mg | DELAYED_RELEASE_TABLET | Freq: Every day | ORAL | Status: DC
  Administered 2017-12-30: 40 mg via ORAL
  Filled 2017-12-30: qty 1

## 2017-12-30 MED ORDER — GABAPENTIN 400 MG PO CAPS
400.0000 mg | ORAL_CAPSULE | Freq: Three times a day (TID) | ORAL | Status: DC
  Administered 2017-12-30: 400 mg via ORAL
  Filled 2017-12-30: qty 1

## 2017-12-30 MED ORDER — AZELASTINE HCL 0.1 % NA SOLN
2.0000 | Freq: Two times a day (BID) | NASAL | Status: DC
  Administered 2017-12-30: 2 via NASAL
  Filled 2017-12-30: qty 30

## 2017-12-30 MED ORDER — HYDRALAZINE HCL 20 MG/ML IJ SOLN: 10.0000 mg | INTRAMUSCULAR | Status: DC | PRN

## 2017-12-30 MED ORDER — ONDANSETRON HCL 4 MG/2ML IJ SOLN: 4.0000 mg | Freq: Four times a day (QID) | INTRAMUSCULAR | Status: DC | PRN

## 2017-12-30 MED ORDER — QUETIAPINE FUMARATE 100 MG PO TABS: 100.0000 mg | ORAL_TABLET | Freq: Every day | ORAL | Status: DC

## 2017-12-30 MED ORDER — CLONIDINE HCL 0.2 MG PO TABS
0.2000 mg | ORAL_TABLET | Freq: Two times a day (BID) | ORAL | Status: DC
  Administered 2017-12-30 (×2): 0.2 mg via ORAL
  Filled 2017-12-30 (×2): qty 1

## 2017-12-30 MED ORDER — ACETAMINOPHEN 650 MG RE SUPP: 650.0000 mg | Freq: Four times a day (QID) | RECTAL | Status: DC | PRN

## 2017-12-30 MED ORDER — SENNA 8.6 MG PO TABS: 1.0000 | ORAL_TABLET | Freq: Every day | ORAL | Status: DC | PRN

## 2017-12-30 MED ORDER — CYCLOBENZAPRINE HCL 10 MG PO TABS: 10.0000 mg | ORAL_TABLET | Freq: Every day | ORAL | Status: DC

## 2017-12-30 MED ORDER — SODIUM CHLORIDE 0.9% FLUSH: 3.0000 mL | INTRAVENOUS | Status: DC | PRN

## 2017-12-30 MED ORDER — ONDANSETRON HCL 4 MG PO TABS: 4.0000 mg | ORAL_TABLET | Freq: Four times a day (QID) | ORAL | Status: DC | PRN

## 2017-12-30 NOTE — Discharge Summary (Signed)
Physician Discharge Summary  Marc [name] ZOX:[id number] DOB: [birth year] DOA: 12/29/2017  PCP: Fayrene Helper, MD  Admit date: 12/29/2017 Discharge date: 12/30/2017  Admitted From: Home Disposition: Home  Recommendations for Outpatient Follow-up:  1. Follow up with PCP in 1-2 weeks, please discuss your blood pressure with your primary care doctor 2. Please obtain BMP/CBC in one week  Home Health: No Equipment/Devices: None  Discharge Condition: Stable CODE STATUS: Regular  Diet recommendation: Regular  Brief/Interim Summary:  #) Altered mental status: On admission patient's altered mental status had resolved.  He did not have any focal neurological deficits.  CT imaging of the head and neck were completely unremarkable.  On discharge patient was alert and oriented x4.  On discussion with the patient about his altered mental status he reported ingesting psychedelic drugs that would not show up on a urine drug screen.  Informed the patient that based on his current medication regimen he should not take these drugs.  Counseling provided.  #) Pain/psych: Patient was continued on his home clonazepam as needed, cyclobenzaprine, gabapentin, fluoxetine, type pain.  It is unclear how compliant with patient was with these medications.  He reported supreme compliance however his urine drug screen was negative for benzodiazepines.  Patient apparently has a stimulator device.  #) Hypertension: Patient was continued on home clonidine 0.2 mg twice daily and aspirin 81 mg.  Discharge Diagnoses:  Principal Problem:   Acute encephalopathy Active Problems:   Anxiety and depression   GERD   Essential hypertension   Chronic pain syndrome   Encephalopathy acute    Discharge Instructions  Discharge Instructions    Call MD for:  difficulty breathing, headache or visual disturbances   Complete by:  As directed    Call MD for:  hives   Complete by:  As directed    Call MD for:  persistant  nausea and vomiting   Complete by:  As directed    Call MD for:  redness, tenderness, or signs of infection (pain, swelling, redness, odor or green/yellow discharge around incision site)   Complete by:  As directed    Call MD for:  severe uncontrolled pain   Complete by:  As directed    Call MD for:  temperature >100.4   Complete by:  As directed    Diet - low sodium heart healthy   Complete by:  As directed    Discharge instructions   Complete by:  As directed    Please follow-up with your PCP in 1 to 2 weeks.   Increase activity slowly   Complete by:  As directed      Allergies as of 12/30/2017      Reactions   Levofloxacin Nausea Only, Other (See Comments)   "Creepy" feeling, skin didn't feel right, sort of itchy.   Ciprofloxacin    Other reaction(s): Sweating (intolerance)   Sulfa Antibiotics       Medication List    TAKE these medications   acetaminophen 500 MG tablet Commonly known as:  TYLENOL Take 500 mg by mouth as needed.   aspirin EC 81 MG tablet Take 81 mg by mouth daily.   azelastine 0.1 % nasal spray Commonly known as:  ASTELIN Place 2 sprays into both nostrils 2 (two) times daily. Use in each nostril as directed What changed:    when to take this  reasons to take this  additional instructions   clonazePAM 0.5 MG tablet Commonly known as:  KLONOPIN Take 1 tablet (  0.5 mg total) by mouth 3 (three) times daily as needed for anxiety.   cloNIDine 0.2 MG tablet Commonly known as:  CATAPRES TAKE (1) TABLET BY MOUTH TWICE DAILY.   cyclobenzaprine 10 MG tablet Commonly known as:  FLEXERIL TAKE (1) TABLET BY MOUTH AT BEDTIME.   DIGESTIVE HEALTH PROBIOTIC PO Take 1 capsule by mouth as needed.   Fish Oil 1000 MG Caps Take 1 capsule by mouth daily.   FLUoxetine 20 MG capsule Commonly known as:  PROZAC Take two in the morning and one in the afternoon   gabapentin 400 MG capsule Commonly known as:  NEURONTIN Take 1 capsule (400 mg total) by mouth 3  (three) times daily.   ibuprofen 600 MG tablet Commonly known as:  ADVIL,MOTRIN Take 1 tablet (600 mg total) by mouth every 8 (eight) hours as needed.   milk thistle 175 MG tablet Take 175 mg by mouth daily.   MULTIVITAMIN ADULTS 50+ Tabs Take 1 tablet by mouth daily.   olopatadine 0.1 % ophthalmic solution Commonly known as:  PATANOL Place 1 drop into both eyes 2 (two) times daily.   omeprazole 20 MG capsule Commonly known as:  PRILOSEC Take 20 mg by mouth daily.   ondansetron 4 MG tablet Commonly known as:  ZOFRAN One tablet once daily as needed, for uncontrolled nausea   QUEtiapine 50 MG tablet Commonly known as:  SEROQUEL Take by mouth 1 tablet 3 times daily and 2 at bedtime   senna 8.6 MG Tabs tablet Commonly known as:  SENOKOT Take 1 tablet by mouth daily as needed for mild constipation.       Allergies  Allergen Reactions  . Levofloxacin Nausea Only and Other (See Comments)    "Creepy" feeling, skin didn't feel right, sort of itchy.  . Ciprofloxacin     Other reaction(s): Sweating (intolerance)  . Sulfa Antibiotics     Consultations:  Tele-neurology   Procedures/Studies: Ct Angio Head W Or Wo Contrast  Result Date: 12/29/2017 CLINICAL DATA:  Ataxia, confusion.  Suspect stroke. EXAM: CT ANGIOGRAPHY HEAD AND NECK TECHNIQUE: Multidetector CT imaging of the head and neck was performed using the standard protocol during bolus administration of intravenous contrast. Multiplanar CT image reconstructions and MIPs were obtained to evaluate the vascular anatomy. Carotid stenosis measurements (when applicable) are obtained utilizing NASCET criteria, using the distal internal carotid diameter as the denominator. CONTRAST:  166mL ISOVUE-370 IOPAMIDOL (ISOVUE-370) INJECTION 76% COMPARISON:  CT HEAD Dec 29, 2017 FINDINGS: CTA NECK FINDINGS: AORTIC ARCH: Normal appearance of the thoracic arch, normal branch pattern. Mild calcific atherosclerosis. The origins of the  innominate, left Common carotid artery and subclavian artery are patent, streak artifact from retained subclavian venous contrast. RIGHT CAROTID SYSTEM: Common carotid artery is patent. Mild calcific atherosclerosis of the carotid bifurcation without hemodynamically significant stenosis by NASCET criteria. Normal appearance of the internal carotid artery. LEFT CAROTID SYSTEM: Common carotid artery is patent. Mild calcific atherosclerosis of the carotid bifurcation without hemodynamically significant stenosis by NASCET criteria. Normal appearance of the internal carotid artery. VERTEBRAL ARTERIES:Left vertebral artery is dominant. Patent vertebral artery's, mild extrinsic compression from degenerative cervical spine. SKELETON: No acute osseous process though bone windows have not been submitted. Status post C5-6 fusion with arthrodesis. OTHER NECK: Soft tissues of the neck are nonacute though, not tailored for evaluation. UPPER CHEST: Included lung apices are clear. No superior mediastinal lymphadenopathy. CTA HEAD FINDINGS: ANTERIOR CIRCULATION: Patent cervical internal carotid arteries, petrous, cavernous and supra clinoid internal carotid arteries. Patent  anterior communicating artery. Patent anterior and middle cerebral arteries. No large vessel occlusion, significant stenosis, contrast extravasation or aneurysm. POSTERIOR CIRCULATION: Patent vertebral arteries, vertebrobasilar junction and basilar artery, as well as main branch vessels. Patent posterior cerebral arteries. No large vessel occlusion, significant stenosis, contrast extravasation or aneurysm. VENOUS SINUSES: Major dural venous sinuses are patent though not tailored for evaluation on this angiographic examination. ANATOMIC VARIANTS: None. DELAYED PHASE: No abnormal intracranial enhancement. MIP images reviewed. IMPRESSION: CTA NECK: No hemodynamically significant stenosis or acute vascular process. CTA HEAD: No emergent large vessel occlusion or flow  limiting stenosis. Electronically Signed   By: Elon Alas M.D.   On: 12/29/2017 23:15   Ct Head Wo Contrast  Result Date: 12/29/2017 CLINICAL DATA:  Confusion EXAM: CT HEAD WITHOUT CONTRAST TECHNIQUE: Contiguous axial images were obtained from the base of the skull through the vertex without intravenous contrast. COMPARISON:  Brain MRI 10/03/2009 FINDINGS: Brain: No acute territorial infarction, hemorrhage or intracranial mass. Moderate atrophy, slight progression since 2011. Mild small vessel ischemic changes of the white matter. Slight interval ventricular enlargement. Vascular: No hyperdense vessels. Scattered calcifications at the carotid siphons Skull: No fracture Sinuses/Orbits: No acute finding. Other: None IMPRESSION: 1. No definite CT evidence for acute intracranial abnormality. 2. Slight progression of atrophy. Enlarged ventricles, slight increased compared to prior, probably due to atrophy. Small amount of small vessel ischemic change of the white matter Electronically Signed   By: Donavan Foil M.D.   On: 12/29/2017 22:28   Ct Angio Neck W Or Wo Contrast  Result Date: 12/29/2017 CLINICAL DATA:  Ataxia, confusion.  Suspect stroke. EXAM: CT ANGIOGRAPHY HEAD AND NECK TECHNIQUE: Multidetector CT imaging of the head and neck was performed using the standard protocol during bolus administration of intravenous contrast. Multiplanar CT image reconstructions and MIPs were obtained to evaluate the vascular anatomy. Carotid stenosis measurements (when applicable) are obtained utilizing NASCET criteria, using the distal internal carotid diameter as the denominator. CONTRAST:  113mL ISOVUE-370 IOPAMIDOL (ISOVUE-370) INJECTION 76% COMPARISON:  CT HEAD Dec 29, 2017 FINDINGS: CTA NECK FINDINGS: AORTIC ARCH: Normal appearance of the thoracic arch, normal branch pattern. Mild calcific atherosclerosis. The origins of the innominate, left Common carotid artery and subclavian artery are patent, streak artifact  from retained subclavian venous contrast. RIGHT CAROTID SYSTEM: Common carotid artery is patent. Mild calcific atherosclerosis of the carotid bifurcation without hemodynamically significant stenosis by NASCET criteria. Normal appearance of the internal carotid artery. LEFT CAROTID SYSTEM: Common carotid artery is patent. Mild calcific atherosclerosis of the carotid bifurcation without hemodynamically significant stenosis by NASCET criteria. Normal appearance of the internal carotid artery. VERTEBRAL ARTERIES:Left vertebral artery is dominant. Patent vertebral artery's, mild extrinsic compression from degenerative cervical spine. SKELETON: No acute osseous process though bone windows have not been submitted. Status post C5-6 fusion with arthrodesis. OTHER NECK: Soft tissues of the neck are nonacute though, not tailored for evaluation. UPPER CHEST: Included lung apices are clear. No superior mediastinal lymphadenopathy. CTA HEAD FINDINGS: ANTERIOR CIRCULATION: Patent cervical internal carotid arteries, petrous, cavernous and supra clinoid internal carotid arteries. Patent anterior communicating artery. Patent anterior and middle cerebral arteries. No large vessel occlusion, significant stenosis, contrast extravasation or aneurysm. POSTERIOR CIRCULATION: Patent vertebral arteries, vertebrobasilar junction and basilar artery, as well as main branch vessels. Patent posterior cerebral arteries. No large vessel occlusion, significant stenosis, contrast extravasation or aneurysm. VENOUS SINUSES: Major dural venous sinuses are patent though not tailored for evaluation on this angiographic examination. ANATOMIC VARIANTS: None. DELAYED PHASE: No abnormal  intracranial enhancement. MIP images reviewed. IMPRESSION: CTA NECK: No hemodynamically significant stenosis or acute vascular process. CTA HEAD: No emergent large vessel occlusion or flow limiting stenosis. Electronically Signed   By: Elon Alas M.D.   On: 12/29/2017  23:15     Subjective:   Discharge Exam: Vitals:   12/30/17 0230 12/30/17 1019  BP: (!) 172/101 (!) 158/94  Pulse: 72 74  Resp: 18   Temp: 98.4 F (36.9 C) 98.5 F (36.9 C)  SpO2: 99% 99%   Vitals:   12/30/17 0100 12/30/17 0130 12/30/17 0230 12/30/17 1019  BP: (!) 150/78 136/76 (!) 172/101 (!) 158/94  Pulse: 63 63 72 74  Resp: 18 20 18    Temp:   98.4 F (36.9 C) 98.5 F (36.9 C)  TempSrc:   Oral Oral  SpO2: 96% 96% 99% 99%  Weight:      Height:        General: Pt is alert, awake, not in acute distress Cardiovascular: RRR, S1/S2 +, no rubs, no gallops Respiratory: CTA bilaterally, no wheezing, no rhonchi Abdominal: Soft, NT, ND, bowel sounds + Extremities: no edema, no cyanosis Neuro: Alert and oriented x4, bilateral upper extremity lower extremity 5 out of 5, intact sensation bilaterally in upper extremity and lower extremity, intact cerebellar testing    The results of significant diagnostics from this hospitalization (including imaging, microbiology, ancillary and laboratory) are listed below for reference.     Microbiology: No results found for this or any previous visit (from the past 240 hour(s)).   Labs: BNP (last 3 results) No results for input(s): BNP in the last 8760 hours. Basic Metabolic Panel: Recent Labs  Lab 12/29/17 2209 12/29/17 2229 12/30/17 0034 12/30/17 0522  NA 144 146*  --  144  K 3.9 4.0  --  3.7  CL 113* 116*  --  112*  CO2 19*  --   --  24  GLUCOSE 130* 128*  --  120*  BUN 21* 19  --  18  CREATININE 1.16 1.00 1.11 1.06  CALCIUM 9.9  --   --  9.5   Liver Function Tests: Recent Labs  Lab 12/29/17 2209  AST 28  ALT 31  ALKPHOS 101  BILITOT 0.8  PROT 7.7  ALBUMIN 3.7   No results for input(s): LIPASE, AMYLASE in the last 168 hours. No results for input(s): AMMONIA in the last 168 hours. CBC: Recent Labs  Lab 12/29/17 2209 12/29/17 2229 12/30/17 0034 12/30/17 0522  WBC 4.6  --  5.2 4.3  NEUTROABS 3.5  --   --    --   HGB 13.1 12.6* 13.4 12.7*  HCT 38.1* 37.0* 39.2 38.0*  MCV 92.7  --  92.9 93.6  PLT 163  --  178 167   Cardiac Enzymes: No results for input(s): CKTOTAL, CKMB, CKMBINDEX, TROPONINI in the last 168 hours. BNP: Invalid input(s): POCBNP CBG: Recent Labs  Lab 12/29/17 2204  GLUCAP 135*   D-Dimer No results for input(s): DDIMER in the last 72 hours. Hgb A1c No results for input(s): HGBA1C in the last 72 hours. Lipid Profile No results for input(s): CHOL, HDL, LDLCALC, TRIG, CHOLHDL, LDLDIRECT in the last 72 hours. Thyroid function studies Recent Labs    12/30/17 0034  TSH 2.056   Anemia work up No results for input(s): VITAMINB12, FOLATE, FERRITIN, TIBC, IRON, RETICCTPCT in the last 72 hours. Urinalysis    Component Value Date/Time   COLORURINE YELLOW 06/05/2007 [place] 06/05/2007 1615  LABSPEC 1.010 06/05/2007 1615   PHURINE 5.5 06/05/2007 1615   GLUCOSEU NEGATIVE 06/05/2007 1615   HGBUR NEGATIVE 06/05/2007 [place] 06/05/2007 [place] 06/05/2007 1615   PROTEINUR NEGATIVE 06/05/2007 1615   UROBILINOGEN 0.2 06/05/2007 1615   NITRITE NEGATIVE 06/05/2007 1615   LEUKOCYTESUR  06/05/2007 1615    NEGATIVE MICROSCOPIC NOT DONE ON URINES WITH NEGATIVE PROTEIN, BLOOD, LEUKOCYTES, NITRITE, OR GLUCOSE <1000 mg/dL.   Sepsis Labs Invalid input(s): PROCALCITONIN,  WBC,  LACTICIDVEN Microbiology No results found for this or any previous visit (from the past 240 hour(s)).   Time coordinating discharge: Over 30 minutes  SIGNED:   Cristy Folks, MD  [facility] 12/30/2017, 11:31 AM  If 7PM-7AM, please contact night-coverage [email or web address] Password [email or web address]

## 2017-12-30 NOTE — Care Management Note (Addendum)
Case Management Note  Patient Details  Name: Marc [name] MRN: [id number] Date of Birth: 1956-07-27  Subjective/Objective:  Adm with Acute encephalopathy. Per attending cleared medically. Met with patient at bedside. He is alert and pleasant. He does seem forgetful about things. He reports ind with ADL's. He is retired from the [employer]. Has a son that lives in [place]. Has transportation and a PCP. Lives in [address] apartments.  CM called Emergency contacts on chart. Marc [name] returned call. Marc [name] he last talked with patient about 3 days ago. Reports that patient does have forgetfulness at times. He feels it is related to his medications that he takes, but states he get better "after a couple of hours".  Marc [name] reports he can transport patient home today if discharged.                   Action/Plan: DC home.   Expected Discharge Date:      12/30/2017            Expected Discharge Plan:  Home/Self Care  In-House Referral:     Discharge planning Services  CM Consult  Post Acute Care Choice:  NA Choice offered to:  NA  DME Arranged:    DME Agency:     HH Arranged:    HH Agency:     Status of Service:  Completed, signed off  If discussed at [employer] of Stay Meetings, dates discussed:    Additional Comments:  Worthy Boschert, Chauncey Reading, RN 12/30/2017, 11:18 AM

## 2017-12-30 NOTE — Progress Notes (Signed)
Patient discharged home today per Dr. Herbert Moors. Patient's IV site D/c'd and WDL. Patient's VSS. Patient provided with home medication list, discharge instructions, and prescriptions. Patient verbalized understanding. Patient left floor via WC in stable condition.

## 2017-12-31 LAB — RPR: RPR Ser Ql: NONREACTIVE

## 2018-01-01 ENCOUNTER — Other Ambulatory Visit: Payer: Self-pay | Admitting: Family Medicine

## 2018-01-03 LAB — HIV 1/2 AB DIFFERENTIATION
HIV 1 AB: NEGATIVE
HIV 2 Ab: NEGATIVE
NOTE (HIV CONF MULTISPOT): NEGATIVE

## 2018-01-03 LAB — RNA QUALITATIVE: HIV 1 RNA Qualitative: 1

## 2018-01-03 LAB — HIV ANTIBODY (ROUTINE TESTING W REFLEX): HIV SCREEN 4TH GENERATION: REACTIVE — AB

## 2018-01-04 ENCOUNTER — Telehealth: Payer: Self-pay

## 2018-01-04 NOTE — Telephone Encounter (Signed)
[id number] Attempted to contact patient to complete [facility] telephone call. No answer. Left message asking patient to return call. 2nd attempt.

## 2018-01-04 NOTE — Telephone Encounter (Signed)
[id number]  Attempted to contact patient to complete [facility] telephone call. No answer. Left message asking for a return call. Will attempt call again later. 1st attempt

## 2018-01-04 NOTE — Telephone Encounter (Signed)
[id number] Attempted to contact patient to complete [facility] telephone call. No answer Left message requesting call back. 3rd attempt.

## 2018-02-07 ENCOUNTER — Encounter: Payer: Self-pay | Admitting: Family Medicine

## 2018-02-14 ENCOUNTER — Other Ambulatory Visit: Payer: Self-pay | Admitting: Family Medicine

## 2018-03-01 ENCOUNTER — Other Ambulatory Visit (HOSPITAL_COMMUNITY): Payer: Self-pay | Admitting: Psychiatry

## 2018-03-03 ENCOUNTER — Encounter: Payer: Self-pay | Admitting: Family Medicine

## 2018-03-07 ENCOUNTER — Encounter: Payer: Self-pay | Admitting: Family Medicine

## 2018-03-15 ENCOUNTER — Ambulatory Visit (INDEPENDENT_AMBULATORY_CARE_PROVIDER_SITE_OTHER): Payer: Medicare HMO | Admitting: Family Medicine

## 2018-03-15 ENCOUNTER — Encounter: Payer: Self-pay | Admitting: Family Medicine

## 2018-03-15 ENCOUNTER — Other Ambulatory Visit: Payer: Self-pay | Admitting: Family Medicine

## 2018-03-15 VITALS — BP 134/82 | HR 89 | Resp 16 | Ht 70.0 in | Wt 168.0 lb

## 2018-03-15 DIAGNOSIS — Z1211 Encounter for screening for malignant neoplasm of colon: Secondary | ICD-10-CM | POA: Diagnosis not present

## 2018-03-15 DIAGNOSIS — Z0000 Encounter for general adult medical examination without abnormal findings: Secondary | ICD-10-CM

## 2018-03-15 DIAGNOSIS — Z125 Encounter for screening for malignant neoplasm of prostate: Secondary | ICD-10-CM

## 2018-03-15 DIAGNOSIS — R7303 Prediabetes: Secondary | ICD-10-CM | POA: Diagnosis not present

## 2018-03-15 DIAGNOSIS — R404 Transient alteration of awareness: Secondary | ICD-10-CM | POA: Diagnosis not present

## 2018-03-15 DIAGNOSIS — E559 Vitamin D deficiency, unspecified: Secondary | ICD-10-CM | POA: Diagnosis not present

## 2018-03-15 DIAGNOSIS — G9340 Encephalopathy, unspecified: Secondary | ICD-10-CM | POA: Diagnosis not present

## 2018-03-15 DIAGNOSIS — I10 Essential (primary) hypertension: Secondary | ICD-10-CM

## 2018-03-15 NOTE — Patient Instructions (Addendum)
Wellness with nurse in October  MD follow up in 4 months, call if you need me before  Cologuard to be arranged  Labs today, lipid, cmp and eGFR, pSA, vit D, CBC  You are being referred to neurologist in [facility] re concerning neurologic complaints and recent hospitalization for follow up  Please re establisjh with therapist for mental health needs

## 2018-03-15 NOTE — Progress Notes (Signed)
   Marc [name]     MRN: [id number]      DOB: 1955-12-30   HPI: Patient is in for annual physical exam. F/U recent hospitalization in May also , needs neurology evaluation, 6 monh h/o [email or web address].spells  when he feels totally disengaged from his surroundings, he was hospitalized with a dx of altered mental status and an underlying etiology was not identified C/o increased symptoms of depression, social withdrawal, stses when he does get out however he enjoys it, he is not suicidal or homicidal and has not been in close contact with psychiatry fo some time C/o recent fall because of his dog again with c/o increased back pain Recent labs, if available are reviewed. Immunization is reviewed , and  updated if needed.    PE; BP 134/82   Pulse 89   Resp 16   Ht 5\' 10"  (1.778 m)   Wt 168 lb (76.2 kg)   SpO2 98%   BMI 24.11 kg/m   Pleasant male, alert and oriented x 3, in no cardio-pulmonary distress. Afebrile. HEENT No facial trauma or asymetry. Sinuses non tender. EOMI, pupils equally reactive to light. External ears normal, tympanic membranes clear. Oropharynx moist, no exudate. Neck: supple, no adenopathy,JVD or thyromegaly.No bruits.  Chest: Clear to ascultation bilaterally.No crackles or wheezes. Non tender to palpation  Cardiovascular system; Heart sounds normal,  S1 and  S2 ,no S3.  No murmur, or thrill. Apical beat not displaced Peripheral pulses normal.  Abdomen: Soft, non tender, no organomegaly or masses. No bruits. Bowel sounds normal. No guarding, tenderness or rebound.  Rectal:  No exam performed, cologuard testing is aeranged   Musculoskeletal exam: Full ROM of spine, hips , shoulders and knees. No deformity ,swelling or crepitus noted. No muscle wasting or atrophy.   Neurologic: Cranial nerves 2 to 12 intact. Power, tone ,sensation and reflexes normal throughout. No disturbance in gait. No tremor.  Skin: Intact, no ulceration, erythema , scaling  or rash noted. Pigmentation normal throughout  Psych; Normal mood and affect.   Assessment & Plan:  Annual physical exam Annual exam as documented. Counseling done  re healthy lifestyle involving commitment to 150 minutes exercise per week, heart healthy diet, and attaining healthy weight.The importance of adequate sleep also discussed. Immunization and cancer screening needs are specifically addressed at this visit.   Acute encephalopathy Pt hospitalized for acute encephalapthy, no underlying cause was identified and the d/c note reports pt stated he ingested psychedelic drugs , whis Mr Lembke now denies and also there is a concern regarding his compliance with psych prescriptions as although klonopin is prescribed for daily use , his UDS was negative for benzos and over the past year he has reported episodes during which he appears to have altered mental status, during which he feels partially disconnected from t his physical environment. Also reports hallucinations, which are not terrifying to him He needs f/u both with psychiatry as well as new pt eval with neurology

## 2018-03-17 ENCOUNTER — Encounter: Payer: Self-pay | Admitting: Gastroenterology

## 2018-03-17 ENCOUNTER — Ambulatory Visit (HOSPITAL_COMMUNITY): Payer: Medicare HMO | Admitting: Psychiatry

## 2018-03-21 ENCOUNTER — Ambulatory Visit (HOSPITAL_COMMUNITY): Payer: Self-pay | Admitting: Psychiatry

## 2018-03-27 ENCOUNTER — Encounter: Payer: Self-pay | Admitting: Family Medicine

## 2018-03-27 NOTE — Assessment & Plan Note (Signed)
Annual exam as documented. Counseling done  re healthy lifestyle involving commitment to 150 minutes exercise per week, heart healthy diet, and attaining healthy weight.The importance of adequate sleep also discussed.  Immunization and cancer screening needs are specifically addressed at this visit.  

## 2018-03-27 NOTE — Assessment & Plan Note (Signed)
Pt hospitalized for acute encephalapthy, no underlying cause was identified and the d/c note reports pt stated he ingested psychedelic drugs , whis Mr Miler now denies and also there is a concern regarding his compliance with psych prescriptions as although klonopin is prescribed for daily use , his UDS was negative for benzos and over the past year he has reported episodes during which he appears to have altered mental status, during which he feels partially disconnected from t his physical environment. Also reports hallucinations, which are not terrifying to him He needs f/u both with psychiatry as well as new pt eval with neurology

## 2018-03-29 ENCOUNTER — Ambulatory Visit (HOSPITAL_COMMUNITY): Payer: Medicare HMO | Admitting: Psychiatry

## 2018-03-30 ENCOUNTER — Ambulatory Visit (HOSPITAL_COMMUNITY): Payer: Medicare HMO | Admitting: Psychiatry

## 2018-03-31 ENCOUNTER — Ambulatory Visit (HOSPITAL_COMMUNITY): Payer: Medicare HMO | Admitting: Psychiatry

## 2018-03-31 ENCOUNTER — Encounter (HOSPITAL_COMMUNITY): Payer: Self-pay | Admitting: Psychiatry

## 2018-03-31 VITALS — BP 142/88 | HR 104 | Ht 70.0 in | Wt 170.0 lb

## 2018-03-31 DIAGNOSIS — F331 Major depressive disorder, recurrent, moderate: Secondary | ICD-10-CM

## 2018-03-31 MED ORDER — GABAPENTIN 400 MG PO CAPS: 400.0000 mg | ORAL_CAPSULE | Freq: Three times a day (TID) | ORAL | 2 refills | Status: DC

## 2018-03-31 MED ORDER — CLONAZEPAM 0.5 MG PO TABS: 0.5000 mg | ORAL_TABLET | Freq: Three times a day (TID) | ORAL | 2 refills | Status: DC | PRN

## 2018-03-31 MED ORDER — FLUOXETINE HCL 20 MG PO CAPS: ORAL_CAPSULE | ORAL | 2 refills | Status: DC

## 2018-03-31 MED ORDER — QUETIAPINE FUMARATE 50 MG PO TABS: ORAL_TABLET | ORAL | 0 refills | Status: DC

## 2018-03-31 NOTE — Progress Notes (Signed)
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 He has one son age 67 years old and lives in [name]  The patient states he has a history depression that started around 2009. Prior to that year he been treated with ribavirin to treat hepatitis C which made him very depressed. He's also had a number of deaths in his family-both sisters have died as well as both parents in the last several years. His son isnotvery close to him and he only talks to him periodically.  Currently the patient is struggling with medical problems including neck and back pain. He recently had a neurostimulator installed in his back which is helped to some degree. Lately he's been a bit more depressed because his aunt keeps calling and wanting to talk about all the dead relatives. He's not been seeing his counselor regularly and I think it would be helpful for him to get back into counseling. He does have friends and he walks his dog 2 miles a day. He admits to passive suicidal ideation but would never hurt himself.  The patient returns after an almost 72-month absence.  Since I last saw him he was hospitalized in May due to altered mental status.  Apparently he was found in his car just sitting there for hours and a neighbor called EMS.  When he got to the hospital he was disoriented and did not know who he was where he was etc.  He told the physician that he had used a psychoactive substance but did not know what it was.  His urine drug screen was negative.  His CT scan showed atrophy and his angiogram was negative and all of his other labs were unremarkable.  Today he does seem a bit disorganized but eventually can get to the point.  He is somewhat slowed in his  thinking.  After much discussion and questioning he admits that he used kratom which is a "natural opioid".  He states he used it about 10 times in May and June which may explain some of his altered mental status.  He claims he is stop this and he denies use of any other unprescribed substances.  Dr. Moshe Cipro is also seen him and is referred him to neurology but he has not heard anything yet and I think this is important.  He denies being seriously depressed or suicidal or anxious.  He claims that he sleeps "on and off and is eating okay." Visit Diagnosis:    ICD-10-CM   1. Major depressive disorder, recurrent, moderate (HCC) F33.1     Past Psychiatric History: Prior treatment in our clinic for depression  Past Medical History:  Past Medical History:  Diagnosis Date  . Allergy   . Anxiety   . Arthritis   . Back problem   . Depression   . GERD (gastroesophageal reflux disease)   . Hepatitis C 1979  . Hepatitis C reactive    LIVER BX 2008-CHRONIC ACTIVE HEPATITIS  . HTN (hypertension)   . Hx of adenomatous colonic polyps 2008   due for surveillance 2017  . Hypercholesterolemia   . IBS (irritable bowel syndrome)   . Knee pain   . Leukopenia 11/06/2015  . Normal cardiac stress  test 05/25/2011   [facility]  . Normal echocardiogram 05/25/2011   EF 55%, mild TR  . Substance abuse ([place])    narcotic addiction  . Thrombocytopenia ([place]) 11/06/2015    Past Surgical History:  Procedure Laterality Date  . APPENDECTOMY    . CARPAL TUNNEL RELEASE     rt  . COLONOSCOPY  2008 [facility] ARS D100 V8 PHEN 12.5   2 SIMPLE ADENOMAS (< 1 CM)  . ESOPHAGOGASTRODUODENOSCOPY  04/26/2012   Dr. Oneida Alar: Non-erosive gastritis (inflammation) was found in the gastric antrum but no H.pylori; multiple biopsies (duodenal bx negative for Celiac)/ The mucosa of the esophagus appeared normal  . EUS  09/08/2012   Dr. Ardis Hughs: CBD dilated but no stones. Query secondary to Sphincter of Oddi stenosis,  ?dysfunction, but clinically without symptoms  . HARDWARE REMOVAL Right 02/12/2014   Procedure: HARDWARE REMOVAL;  Surgeon: Jolyn Nap, MD;  Location: [facility];  Service: Orthopedics;  Laterality: Right;  . KNEE ARTHROSCOPY    . Neurostimulator implant    . right ring finger    . spinal stenosis, had screws put in neck  04/11/2009   DR KRITZER  . TONSILLECTOMY    . ULNA OSTEOTOMY Right 02/12/2014   Procedure: RIGHT ULNAR SHORTENING AND OSTEOTOMY ;  Surgeon: Jolyn Nap, MD;  Location: [facility];  Service: Orthopedics;  Laterality: Right;  . ULNAR NERVE TRANSPOSITION    . UPPER GASTROINTESTINAL ENDOSCOPY  2008 [facility] ABD PAIN WEIGHT LOSS d100 v8 phen 12.5   NL  . WRIST SURGERY Right jan 2015   Dr. Edmonia Lynch    Family Psychiatric History: See below  Family History:  Family History  Problem Relation Age of Onset  . Bladder Cancer Mother        rare form   . Cancer Sister        Metastatic abdominal  . Emotional abuse Sister   . Stroke Sister   . Emotional abuse Sister   . Heart failure Father   . Stroke Father   . Alcohol abuse Father   . Dementia Paternal Aunt   . Alcohol abuse Paternal Uncle   . Dementia Paternal Uncle   . Dementia Paternal Grandmother   . Stroke Paternal Grandmother   . ADD / ADHD Cousin   . Bipolar disorder Cousin   . Anxiety disorder Cousin   . Depression Cousin        Committed suicide  . Alcohol abuse Cousin   . OCD Cousin   . Paranoid behavior Cousin   . Brain cancer Maternal Grandfather   . Heart defect Unknown        FAMILY HX  . Colon cancer Maternal Uncle   . Colon polyps Neg Hx   . Drug abuse Neg Hx   . Schizophrenia Neg Hx   . Seizures Neg Hx   . Sexual abuse Neg Hx   . Physical abuse Neg Hx     Social History:  Social History   Socioeconomic History  . Marital status: Divorced    Spouse name: Not on file  . Number of children: Not on file  . Years of education: Not on file  . Highest  education level: Not on file  Occupational History  . Not on file  Social Needs  . Financial resource strain: Not on file  . Food insecurity:    Worry: Not on file    Inability: Not on file  . Transportation needs:  Medical: Not on file    Non-medical: Not on file  Tobacco Use  . Smoking status: Former Smoker    Packs/day: 1.50    Years: 8.00    Pack years: 12.00    Types: Cigarettes    Last attempt to quit: 08/13/1986    Years since quitting: 31.6  . Smokeless tobacco: Never Used  . Tobacco comment: quit 20 + yrs ago  Substance and Sexual Activity  . Alcohol use: No    Alcohol/week: 0.0 standard drinks  . Drug use: No  . Sexual activity: Never  Lifestyle  . Physical activity:    Days per week: Not on file    Minutes per session: Not on file  . Stress: Not on file  Relationships  . Social connections:    Talks on phone: Not on file    Gets together: Not on file    Attends religious service: Not on file    Active member of club or organization: Not on file    Attends meetings of clubs or organizations: Not on file    Relationship status: Not on file  Other Topics Concern  . Not on file  Social History Narrative  . Not on file    Allergies:  Allergies  Allergen Reactions  . Levofloxacin Nausea Only and Other (See Comments)    "Creepy" feeling, skin didn't feel right, sort of itchy.  . Ciprofloxacin     Other reaction(s): Sweating (intolerance)  . Sulfa Antibiotics     Metabolic Disorder Labs: Lab Results  Component Value Date   HGBA1C 5.5 12/30/2017   MPG 111.15 12/30/2017   MPG 111 10/22/2016   No results found for: PROLACTIN Lab Results  Component Value Date   CHOL 229 (H) 10/22/2016   TRIG 134 10/22/2016   HDL 57 10/22/2016   CHOLHDL 4.0 10/22/2016   VLDL 27 10/22/2016   LDLCALC 145 (H) 10/22/2016   LDLCALC 100 10/21/2015   Lab Results  Component Value Date   TSH 2.056 12/30/2017   TSH 1.724 10/22/2016    Therapeutic Level Labs: No  results found for: LITHIUM No results found for: VALPROATE No components found for:  CBMZ  Current Medications: Current Outpatient Medications  Medication Sig Dispense Refill  . acetaminophen (TYLENOL) 500 MG tablet Take 500 mg by mouth as needed.    Marland Kitchen aspirin EC 81 MG tablet Take 81 mg by mouth daily.    Marland Kitchen azelastine (ASTELIN) 0.1 % nasal spray Place 2 sprays into both nostrils 2 (two) times daily. Use in each nostril as directed (Patient taking differently: Place 2 sprays into both nostrils 2 (two) times daily as needed for allergies. Use in each nostril as directed) 30 mL 4  . clonazePAM (KLONOPIN) 0.5 MG tablet Take 1 tablet (0.5 mg total) by mouth 3 (three) times daily as needed for anxiety. 90 tablet 2  . cloNIDine (CATAPRES) 0.2 MG tablet TAKE (1) TABLET BY MOUTH TWICE DAILY. 60 tablet 0  . FLUoxetine (PROZAC) 20 MG capsule Take two in the morning and one in the afternoon 90 capsule 2  . gabapentin (NEURONTIN) 400 MG capsule Take 1 capsule (400 mg total) by mouth 3 (three) times daily. 90 capsule 2  . ibuprofen (ADVIL,MOTRIN) 600 MG tablet Take 1 tablet (600 mg total) by mouth every 8 (eight) hours as needed. 30 tablet 0  . Lactobacillus (DIGESTIVE HEALTH PROBIOTIC PO) Take 1 capsule by mouth as needed.     . milk thistle 175 MG tablet Take  175 mg by mouth daily.    . Multiple Vitamins-Minerals (MULTIVITAMIN ADULTS 50+) TABS Take 1 tablet by mouth daily.    Marland Kitchen olopatadine (PATANOL) 0.1 % ophthalmic solution Place 1 drop into both eyes 2 (two) times daily. 5 mL 2  . Omega-3 Fatty Acids (FISH OIL) 1000 MG CAPS Take 1 capsule by mouth daily.    Marland Kitchen omeprazole (PRILOSEC) 20 MG capsule Take 20 mg by mouth daily.    . ondansetron (ZOFRAN) 4 MG tablet One tablet once daily as needed, for uncontrolled nausea 7 tablet 0  . QUEtiapine (SEROQUEL) 50 MG tablet TAKE ONE TABLET BY MOUTH 3 TIMES DAILY AND 2 AT BEDTIME. 150 tablet 0  . senna (SENOKOT) 8.6 MG TABS tablet Take 1 tablet by mouth daily as  needed for mild constipation.     No current facility-administered medications for this visit.      Musculoskeletal: Strength & Muscle Tone: decreased Gait & Station: unsteady Patient leans: N/A  Psychiatric Specialty Exam: Review of Systems  Constitutional: Positive for malaise/fatigue.  Gastrointestinal: Positive for nausea.  Musculoskeletal: Positive for back pain, falls and joint pain.  Neurological: Positive for dizziness.  Psychiatric/Behavioral: Positive for memory loss. The patient is nervous/anxious.   All other systems reviewed and are negative.   Blood pressure (!) 142/88, pulse (!) 104, height 5\' 10"  (1.778 m), weight 170 lb (77.1 kg), SpO2 99 %.Body mass index is 24.39 kg/m.  General Appearance: Casual and Fairly Groomed  Eye Contact:  Fair  Speech:  Clear and Coherent  Volume:  Decreased  Mood:  Anxious  Affect:  Flat  Thought Process:  Disorganized  Orientation:  Full (Time, Place, and Person)  Thought Content: Tangential   Suicidal Thoughts:  No  Homicidal Thoughts:  No  Memory:  Immediate;   Good Recent;   Poor Remote;   Poor  Judgement:  Impaired  Insight:  Lacking  Psychomotor Activity:  Decreased  Concentration:  Concentration: Fair and Attention Span: Fair  Recall:  Poor  Fund of Knowledge: Fair  Language: Good  Akathisia:  No  Handed:  Right  AIMS (if indicated): not done  Assets:  Communication Skills Desire for Improvement Resilience Social Support Talents/Skills  ADL's:  Intact  Cognition: WNL  Sleep:  Fair   Screenings: PHQ2-9     Office Visit from 03/15/2018 in [facility] from 05/05/2017 in [facility] Visit from 01/13/2017 in [facility] Visit from 10/21/2015 in [facility]  PHQ-2 Total Score  4  1  0  4  PHQ-9 Total Score  11  5  -  14       Assessment and Plan: This patient is a 62 year old male with a remote history of substance abuse depression and  anxiety.  He was recently admitted for altered mental status which may have been secondary to his use of kratom.  He also has brain atrophy which may be due to his long history of substance abuse.  At this point he needs neurology consult and I will message Dr. Moshe Cipro to make sure this goes through.  Interim he will continue Seroquel milligrams 3 times daily and 100 mg at bedtime for mood stabilization, Prozac 40 mg in the morning and 20 mg in the afternoon for depression, clonazepam 0.5 mg 3 times daily as needed for anxiety gabapentin 400 mg 3 times daily for anxiety.  He will return to see me in 6 weeks   [name], MD  03/31/2018, 2:05 PM

## 2018-04-01 ENCOUNTER — Telehealth: Payer: Self-pay | Admitting: Family Medicine

## 2018-04-01 NOTE — Telephone Encounter (Signed)
Please follow up on the neurology referral and see if you can establish date and time The psychiatrist he sees sent me a message specifically asking that I follow up on the referral to the  Neurology as she thinks it is vital, and he has heard nothing, I do not see a date, so please see if you can follow through and close this out Thanks

## 2018-04-15 ENCOUNTER — Other Ambulatory Visit: Payer: Self-pay | Admitting: Family Medicine

## 2018-04-18 ENCOUNTER — Telehealth: Payer: Self-pay | Admitting: Family Medicine

## 2018-04-18 NOTE — Telephone Encounter (Signed)
Pt is calling in needing a refill on his clonidine

## 2018-04-18 NOTE — Telephone Encounter (Signed)
Refill done.  

## 2018-05-12 ENCOUNTER — Ambulatory Visit (HOSPITAL_COMMUNITY): Payer: Medicare HMO | Admitting: Psychiatry

## 2018-05-13 ENCOUNTER — Ambulatory Visit (HOSPITAL_COMMUNITY): Payer: Medicare HMO | Admitting: Psychiatry

## 2018-05-25 ENCOUNTER — Ambulatory Visit: Payer: Self-pay

## 2018-06-01 ENCOUNTER — Encounter (HOSPITAL_COMMUNITY): Payer: Self-pay | Admitting: Psychiatry

## 2018-06-01 ENCOUNTER — Ambulatory Visit (HOSPITAL_COMMUNITY): Payer: Medicare HMO | Admitting: Psychiatry

## 2018-06-01 VITALS — BP 109/60 | HR 96 | Ht 70.0 in | Wt 165.0 lb

## 2018-06-01 DIAGNOSIS — F331 Major depressive disorder, recurrent, moderate: Secondary | ICD-10-CM | POA: Diagnosis not present

## 2018-06-01 MED ORDER — CLONAZEPAM 0.5 MG PO TABS: 0.5000 mg | ORAL_TABLET | Freq: Three times a day (TID) | ORAL | 2 refills | Status: DC | PRN

## 2018-06-01 MED ORDER — QUETIAPINE FUMARATE 50 MG PO TABS: ORAL_TABLET | ORAL | 0 refills | Status: DC

## 2018-06-01 MED ORDER — GABAPENTIN 400 MG PO CAPS: 400.0000 mg | ORAL_CAPSULE | Freq: Three times a day (TID) | ORAL | 2 refills | Status: DC

## 2018-06-01 MED ORDER — FLUOXETINE HCL 20 MG PO CAPS: ORAL_CAPSULE | ORAL | 2 refills | Status: DC

## 2018-06-01 NOTE — Progress Notes (Signed)
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 He has one son age 67 years old and lives in [place]  The patient states he has a history depression that started around 2009. Prior to that year he been treated with ribavirin to treat hepatitis C which made him very depressed. He's also had a number of deaths in his family-both sisters have died as well as both parents in the last several years. His son isnotvery close to him and he only talks to him periodically.  Currently the patient is struggling with medical problems including neck and back pain. He recently had a neurostimulator installed in his back which is helped to some degree. Lately he's been a bit more depressed because his aunt keeps calling and wanting to talk about all the dead relatives. He's not been seeing his counselor regularly and I think it would be helpful for him to get back into counseling. He does have friends and he walks his dog 2 miles a day. He admits to passive suicidal ideation but would never hurt himself.  The patient returns after 3 months.  He states that he has had the flu and feels tired all the time.  His back hip and rib cage hurts.  He seems to walk a little unsteadily and looks tired and frail.  Last time I saw him he had been in hospitalized for altered mental status.  This was after he had ingested kratom which is a "natural narcotic."  He claims he does not use anymore of this and he seems more oriented today.  He was supposed to have a neurology referral but this never seemed to have transpired.  He states that his mood has been a bit low because several people he knows have died but he is not suicidal and is trying to push himself to get out  walk and see other people.  He denies suicidal ideation.  He denies any psychotic symptoms or confusion.  He denies any recent falls   Visit Diagnosis:    ICD-10-CM   1. Major depressive disorder, recurrent, moderate (HCC) F33.1     Past Psychiatric History: Prior treatment in our clinic for depression  Past Medical History:  Past Medical History:  Diagnosis Date  . Allergy   . Anxiety   . Arthritis   . Back problem   . Depression   . GERD (gastroesophageal reflux disease)   . Hepatitis C 1979  . Hepatitis C reactive    LIVER BX 2008-CHRONIC ACTIVE HEPATITIS  . HTN (hypertension)   . Hx of adenomatous colonic polyps 2008   due for surveillance 2017  . Hypercholesterolemia   . IBS (irritable bowel syndrome)   . Knee pain   . Leukopenia 11/06/2015  . Normal cardiac stress test 05/25/2011   [facility]  . Normal echocardiogram 05/25/2011   EF 55%, mild TR  . Substance abuse ([facility])    narcotic addiction  . Thrombocytopenia ([facility]) 11/06/2015    Past Surgical History:  Procedure Laterality Date  . APPENDECTOMY    . CARPAL TUNNEL RELEASE     rt  . COLONOSCOPY  2008 SLF ARS D100 V8 PHEN 12.5   2 SIMPLE  ADENOMAS (< 1 CM)  . ESOPHAGOGASTRODUODENOSCOPY  04/26/2012   Dr. Oneida Alar: Non-erosive gastritis (inflammation) was found in the gastric antrum but no H.pylori; multiple biopsies (duodenal bx negative for Celiac)/ The mucosa of the esophagus appeared normal  . EUS  09/08/2012   Dr. Ardis Hughs: CBD dilated but no stones. Query secondary to Sphincter of Oddi stenosis, ?dysfunction, but clinically without symptoms  . HARDWARE REMOVAL Right 02/12/2014   Procedure: HARDWARE REMOVAL;  Surgeon: Jolyn Nap, MD;  Location: [facility];  Service: Orthopedics;  Laterality: Right;  . KNEE ARTHROSCOPY    . Neurostimulator implant    . right ring finger    . spinal stenosis, had screws put in neck  04/11/2009   DR KRITZER  . TONSILLECTOMY    . ULNA OSTEOTOMY  Right 02/12/2014   Procedure: RIGHT ULNAR SHORTENING AND OSTEOTOMY ;  Surgeon: Jolyn Nap, MD;  Location: [facility];  Service: Orthopedics;  Laterality: Right;  . ULNAR NERVE TRANSPOSITION    . UPPER GASTROINTESTINAL ENDOSCOPY  2008 SLF ABD PAIN WEIGHT LOSS d100 v8 phen 12.5   NL  . WRIST SURGERY Right jan 2015   Dr. Edmonia Lynch    Family Psychiatric History: See below  Family History:  Family History  Problem Relation Age of Onset  . Bladder Cancer Mother        rare form   . Cancer Sister        Metastatic abdominal  . Emotional abuse Sister   . Stroke Sister   . Emotional abuse Sister   . Heart failure Father   . Stroke Father   . Alcohol abuse Father   . Dementia Paternal Aunt   . Alcohol abuse Paternal Uncle   . Dementia Paternal Uncle   . Dementia Paternal Grandmother   . Stroke Paternal Grandmother   . ADD / ADHD Cousin   . Bipolar disorder Cousin   . Anxiety disorder Cousin   . Depression Cousin        Committed suicide  . Alcohol abuse Cousin   . OCD Cousin   . Paranoid behavior Cousin   . Brain cancer Maternal Grandfather   . Heart defect Unknown        FAMILY HX  . Colon cancer Maternal Uncle   . Colon polyps Neg Hx   . Drug abuse Neg Hx   . Schizophrenia Neg Hx   . Seizures Neg Hx   . Sexual abuse Neg Hx   . Physical abuse Neg Hx     Social History:  Social History   Socioeconomic History  . Marital status: Divorced    Spouse name: Not on file  . Number of children: Not on file  . Years of education: Not on file  . Highest education level: Not on file  Occupational History  . Not on file  Social Needs  . Financial resource strain: Not on file  . Food insecurity:    Worry: Not on file    Inability: Not on file  . Transportation needs:    Medical: Not on file    Non-medical: Not on file  Tobacco Use  . Smoking status: Former Smoker    Packs/day: 1.50    Years: 8.00    Pack years: 12.00    Types: Cigarettes     Last attempt to quit: 08/13/1986    Years since quitting: 31.8  . Smokeless tobacco: Never Used  . Tobacco comment: quit 20 + yrs ago  Substance and Sexual Activity  . Alcohol use: No    Alcohol/week: 0.0 standard drinks  . Drug use: No  . Sexual activity: Never  Lifestyle  . Physical activity:    Days per week: Not on file    Minutes per session: Not on file  . Stress: Not on file  Relationships  . Social connections:    Talks on phone: Not on file    Gets together: Not on file    Attends religious service: Not on file    Active member of club or organization: Not on file    Attends meetings of clubs or organizations: Not on file    Relationship status: Not on file  Other Topics Concern  . Not on file  Social History Narrative  . Not on file    Allergies:  Allergies  Allergen Reactions  . Levofloxacin Nausea Only and Other (See Comments)    "Creepy" feeling, skin didn't feel right, sort of itchy.  . Ciprofloxacin     Other reaction(s): Sweating (intolerance)  . Sulfa Antibiotics     Metabolic Disorder Labs: Lab Results  Component Value Date   HGBA1C 5.5 12/30/2017   MPG 111.15 12/30/2017   MPG 111 10/22/2016   No results found for: PROLACTIN Lab Results  Component Value Date   CHOL 229 (H) 10/22/2016   TRIG 134 10/22/2016   HDL 57 10/22/2016   CHOLHDL 4.0 10/22/2016   VLDL 27 10/22/2016   LDLCALC 145 (H) 10/22/2016   LDLCALC 100 10/21/2015   Lab Results  Component Value Date   TSH 2.056 12/30/2017   TSH 1.724 10/22/2016    Therapeutic Level Labs: No results found for: LITHIUM No results found for: VALPROATE No components found for:  CBMZ  Current Medications: Current Outpatient Medications  Medication Sig Dispense Refill  . acetaminophen (TYLENOL) 500 MG tablet Take 500 mg by mouth as needed.    Marland Kitchen aspirin EC 81 MG tablet Take 81 mg by mouth daily.    Marland Kitchen azelastine (ASTELIN) 0.1 % nasal spray Place 2 sprays into both nostrils 2 (two) times daily.  Use in each nostril as directed (Patient taking differently: Place 2 sprays into both nostrils 2 (two) times daily as needed for allergies. Use in each nostril as directed) 30 mL 4  . clonazePAM (KLONOPIN) 0.5 MG tablet Take 1 tablet (0.5 mg total) by mouth 3 (three) times daily as needed for anxiety. 90 tablet 2  . cloNIDine (CATAPRES) 0.2 MG tablet TAKE (1) TABLET BY MOUTH TWICE DAILY. 60 tablet 4  . FLUoxetine (PROZAC) 20 MG capsule Take two in the morning and one in the afternoon 90 capsule 2  . gabapentin (NEURONTIN) 400 MG capsule Take 1 capsule (400 mg total) by mouth 3 (three) times daily. 90 capsule 2  . ibuprofen (ADVIL,MOTRIN) 600 MG tablet Take 1 tablet (600 mg total) by mouth every 8 (eight) hours as needed. 30 tablet 0  . Lactobacillus (DIGESTIVE HEALTH PROBIOTIC PO) Take 1 capsule by mouth as needed.     . milk thistle 175 MG tablet Take 175 mg by mouth daily.    . Multiple Vitamins-Minerals (MULTIVITAMIN ADULTS 50+) TABS Take 1 tablet by mouth daily.    Marland Kitchen olopatadine (PATANOL) 0.1 % ophthalmic solution Place 1 drop into both eyes 2 (two) times daily. 5 mL 2  . Omega-3 Fatty Acids (FISH OIL) 1000 MG CAPS Take 1 capsule by mouth daily.    Marland Kitchen omeprazole (PRILOSEC) 20 MG capsule Take 20 mg  by mouth daily.    . ondansetron (ZOFRAN) 4 MG tablet One tablet once daily as needed, for uncontrolled nausea 7 tablet 0  . QUEtiapine (SEROQUEL) 50 MG tablet TAKE ONE TABLET BY MOUTH 3 TIMES DAILY AND 2 AT BEDTIME. 150 tablet 0  . senna (SENOKOT) 8.6 MG TABS tablet Take 1 tablet by mouth daily as needed for mild constipation.     No current facility-administered medications for this visit.      Musculoskeletal: Strength & Muscle Tone: decreased Gait & Station: unsteady Patient leans: N/A  Psychiatric Specialty Exam: Review of Systems  Constitutional: Positive for malaise/fatigue.  Musculoskeletal: Positive for back pain, joint pain, myalgias and neck pain.  Neurological: Positive for  weakness.  Psychiatric/Behavioral: Positive for depression.  All other systems reviewed and are negative.   Blood pressure 109/60, pulse 96, height 5\' 10"  (1.778 m), weight 165 lb (74.8 kg), SpO2 98 %.Body mass index is 23.68 kg/m.  General Appearance: Casual and Fairly Groomed  Eye Contact:  Good  Speech:  Clear and Coherent  Volume:  Decreased  Mood:  Dysphoric  Affect:  Constricted  Thought Process:  Goal Directed  Orientation:  Full (Time, Place, and Person)  Thought Content: Rumination   Suicidal Thoughts:  No  Homicidal Thoughts:  No  Memory:  Immediate;   Good Recent;   Fair Remote;   Poor  Judgement:  Fair  Insight:  Lacking  Psychomotor Activity:  Decreased  Concentration:  Concentration: Fair and Attention Span: Fair  Recall:  [employer] of Knowledge: Fair  Language: Good  Akathisia:  No  Handed:  Right  AIMS (if indicated): not done  Assets:  Communication Skills Desire for Improvement Resilience Social Support Talents/Skills  ADL's:  Intact  Cognition: WNL  Sleep:  Good   Screenings: PHQ2-9     Office Visit from 03/15/2018 in [facility] from 05/05/2017 in [facility] Office Visit from 01/13/2017 in [facility] Visit from 10/21/2015 in [facility]  PHQ-2 Total Score  4  1  0  4  PHQ-9 Total Score  11  5  -  14       Assessment and Plan: This patient is a 62 year old male with a history of depression and anxiety.  He seems a little better than last time but still looks tired and frail.  He is complaining of a lot of body aches and pains as well as low energy.  He is still not seen urology and we found out that they have called him numerous times and he has not answered the phone.  He is going to be given the number onemore time to try to call.  For now he will stay on Seroquel 50 mg 3 times daily and 100 mg at bedtime for mood stabilization, Prozac 40 mg in the morning and 20 mg in the  afternoon for depression, gabapentin 400 mg 3 times daily for anxiety clonazepam 0.5 mg 3 times daily as needed for breakthrough anxiety.  Return to see me in 3 months and hopefully he will follow through and call neurology.   [name], MD 06/01/2018, 11:22 AM

## 2018-06-08 ENCOUNTER — Other Ambulatory Visit (HOSPITAL_COMMUNITY): Payer: Self-pay | Admitting: Psychiatry

## 2018-07-02 ENCOUNTER — Other Ambulatory Visit (HOSPITAL_COMMUNITY): Payer: Self-pay | Admitting: Psychiatry

## 2018-07-18 ENCOUNTER — Ambulatory Visit: Payer: Self-pay | Admitting: Family Medicine

## 2018-07-26 ENCOUNTER — Telehealth: Payer: Self-pay | Admitting: *Deleted

## 2018-07-26 ENCOUNTER — Ambulatory Visit: Payer: Self-pay | Admitting: Neurology

## 2018-07-26 NOTE — Telephone Encounter (Signed)
Pt no showed new pt appt today. 

## 2018-07-27 ENCOUNTER — Encounter: Payer: Self-pay | Admitting: Neurology

## 2018-08-20 ENCOUNTER — Other Ambulatory Visit: Payer: Self-pay | Admitting: Family Medicine

## 2018-09-01 ENCOUNTER — Ambulatory Visit (HOSPITAL_COMMUNITY): Payer: Medicare HMO | Admitting: Psychiatry

## 2018-09-01 ENCOUNTER — Encounter (HOSPITAL_COMMUNITY): Payer: Self-pay | Admitting: Psychiatry

## 2018-09-01 VITALS — BP 152/94 | HR 93 | Ht 70.0 in | Wt 167.0 lb

## 2018-09-01 DIAGNOSIS — F331 Major depressive disorder, recurrent, moderate: Secondary | ICD-10-CM

## 2018-09-01 MED ORDER — CLONAZEPAM 0.5 MG PO TABS: 0.5000 mg | ORAL_TABLET | Freq: Three times a day (TID) | ORAL | 2 refills | Status: DC | PRN

## 2018-09-01 MED ORDER — QUETIAPINE FUMARATE 50 MG PO TABS: ORAL_TABLET | ORAL | 2 refills | Status: DC

## 2018-09-01 MED ORDER — GABAPENTIN 400 MG PO CAPS: 400.0000 mg | ORAL_CAPSULE | Freq: Three times a day (TID) | ORAL | 2 refills | Status: DC

## 2018-09-01 MED ORDER — FLUOXETINE HCL 20 MG PO CAPS: ORAL_CAPSULE | ORAL | 2 refills | Status: DC

## 2018-09-01 NOTE — Progress Notes (Signed)
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 He has one son age 50 years old and lives in [place]  The patient states he has a history depression that started around 2009. Prior to that year he been treated with ribavirin to treat hepatitis C which made him very depressed. He's also had a number of deaths in his family-both sisters have died as well as both parents in the last several years. His son isnotvery close to him and he only talks to him periodically.  Currently the patient is struggling with medical problems including neck and back pain. He recently had a neurostimulator installed in his back which is helped to some degree. Lately he's been a bit more depressed because his aunt keeps calling and wanting to talk about all the dead relatives. He's not been seeing his counselor regularly and I think it would be helpful for him to get back into counseling. He does have friends and he walks his dog 2 miles a day. He admits to passive suicidal ideation but would never hurt himself.  The patient returns after 3 months.  He again states that he is not feeling well has had diarrhea cough cold and congestion.  He did not get a flu shot this year.  He supposed to see his primary doctor next week.  He missed his neurology appointment last month and claims he rescheduled it.  He still feels unsteady at times.  He feels more lonely because many of his friends are out of town or busy.  He does get his dog out several times a day.  He still thinks his medications have been helpful and he denies auditory visual hallucinations or suicidal ideation. Visit Diagnosis:    ICD-10-CM   1. Major depressive disorder, recurrent, moderate (HCC) F33.1      Past Psychiatric History: Prior treatment in our clinic for depression  Past Medical History:  Past Medical History:  Diagnosis Date  . Allergy   . Anxiety   . Arthritis   . Back problem   . Depression   . GERD (gastroesophageal reflux disease)   . Hepatitis C 1979  . Hepatitis C reactive    LIVER BX 2008-CHRONIC ACTIVE HEPATITIS  . HTN (hypertension)   . Hx of adenomatous colonic polyps 2008   due for surveillance 2017  . Hypercholesterolemia   . IBS (irritable bowel syndrome)   . Knee pain   . Leukopenia 11/06/2015  . Normal cardiac stress test 05/25/2011   [facility]  . Normal echocardiogram 05/25/2011   EF 55%, mild TR  . Substance abuse ([facility])    narcotic addiction  . Thrombocytopenia ([facility]) 11/06/2015    Past Surgical History:  Procedure Laterality Date  . APPENDECTOMY    . CARPAL TUNNEL RELEASE     rt  . COLONOSCOPY  2008 SLF ARS D100 V8 PHEN 12.5   2 SIMPLE ADENOMAS (< 1 CM)  . ESOPHAGOGASTRODUODENOSCOPY  04/26/2012   Dr. Oneida Alar: Non-erosive gastritis (inflammation) was found in the gastric antrum but no H.pylori; multiple biopsies (duodenal bx negative for Celiac)/ The mucosa of the esophagus appeared normal  . EUS  09/08/2012   Dr. Ardis Hughs: CBD dilated but no  stones. Query secondary to Sphincter of Oddi stenosis, ?dysfunction, but clinically without symptoms  . HARDWARE REMOVAL Right 02/12/2014   Procedure: HARDWARE REMOVAL;  Surgeon: Jolyn Nap, MD;  Location: [facility];  Service: Orthopedics;  Laterality: Right;  . KNEE ARTHROSCOPY    . Neurostimulator implant    . right ring finger    . spinal stenosis, had screws put in neck  04/11/2009   DR KRITZER  . TONSILLECTOMY    . ULNA OSTEOTOMY Right 02/12/2014   Procedure: RIGHT ULNAR SHORTENING AND OSTEOTOMY ;  Surgeon: Jolyn Nap, MD;  Location: [facility];  Service: Orthopedics;  Laterality: Right;  . ULNAR NERVE TRANSPOSITION    . UPPER  GASTROINTESTINAL ENDOSCOPY  2008 SLF ABD PAIN WEIGHT LOSS d100 v8 phen 12.5   NL  . WRIST SURGERY Right jan 2015   Dr. Edmonia Lynch    Family Psychiatric History: See below  Family History:  Family History  Problem Relation Age of Onset  . Bladder Cancer Mother        rare form   . Cancer Sister        Metastatic abdominal  . Emotional abuse Sister   . Stroke Sister   . Emotional abuse Sister   . Heart failure Father   . Stroke Father   . Alcohol abuse Father   . Dementia Paternal Aunt   . Alcohol abuse Paternal Uncle   . Dementia Paternal Uncle   . Dementia Paternal Grandmother   . Stroke Paternal Grandmother   . ADD / ADHD Cousin   . Bipolar disorder Cousin   . Anxiety disorder Cousin   . Depression Cousin        Committed suicide  . Alcohol abuse Cousin   . OCD Cousin   . Paranoid behavior Cousin   . Brain cancer Maternal Grandfather   . Heart defect Unknown        FAMILY HX  . Colon cancer Maternal Uncle   . Colon polyps Neg Hx   . Drug abuse Neg Hx   . Schizophrenia Neg Hx   . Seizures Neg Hx   . Sexual abuse Neg Hx   . Physical abuse Neg Hx     Social History:  Social History   Socioeconomic History  . Marital status: Divorced    Spouse name: Not on file  . Number of children: Not on file  . Years of education: Not on file  . Highest education level: Not on file  Occupational History  . Not on file  Social Needs  . Financial resource strain: Not on file  . Food insecurity:    Worry: Not on file    Inability: Not on file  . Transportation needs:    Medical: Not on file    Non-medical: Not on file  Tobacco Use  . Smoking status: Former Smoker    Packs/day: 1.50    Years: 8.00    Pack years: 12.00    Types: Cigarettes    Last attempt to quit: 08/13/1986    Years since quitting: 32.0  . Smokeless tobacco: Never Used  . Tobacco comment: quit 20 + yrs ago  Substance and Sexual Activity  . Alcohol use: No    Alcohol/week: 0.0 standard drinks   . Drug use: No  . Sexual activity: Never  Lifestyle  . Physical activity:    Days per week: Not on file    Minutes per session: Not on file  .  Stress: Not on file  Relationships  . Social connections:    Talks on phone: Not on file    Gets together: Not on file    Attends religious service: Not on file    Active member of club or organization: Not on file    Attends meetings of clubs or organizations: Not on file    Relationship status: Not on file  Other Topics Concern  . Not on file  Social History Narrative  . Not on file    Allergies:  Allergies  Allergen Reactions  . Levofloxacin Nausea Only and Other (See Comments)    "Creepy" feeling, skin didn't feel right, sort of itchy.  . Ciprofloxacin     Other reaction(s): Sweating (intolerance)  . Sulfa Antibiotics     Metabolic Disorder Labs: Lab Results  Component Value Date   HGBA1C 5.5 12/30/2017   MPG 111.15 12/30/2017   MPG 111 10/22/2016   No results found for: PROLACTIN Lab Results  Component Value Date   CHOL 229 (H) 10/22/2016   TRIG 134 10/22/2016   HDL 57 10/22/2016   CHOLHDL 4.0 10/22/2016   VLDL 27 10/22/2016   LDLCALC 145 (H) 10/22/2016   LDLCALC 100 10/21/2015   Lab Results  Component Value Date   TSH 2.056 12/30/2017   TSH 1.724 10/22/2016    Therapeutic Level Labs: No results found for: LITHIUM No results found for: VALPROATE No components found for:  CBMZ  Current Medications: Current Outpatient Medications  Medication Sig Dispense Refill  . acetaminophen (TYLENOL) 500 MG tablet Take 500 mg by mouth as needed.    Marland Kitchen aspirin EC 81 MG tablet Take 81 mg by mouth daily.    Marland Kitchen azelastine (ASTELIN) 0.1 % nasal spray Place 2 sprays into both nostrils 2 (two) times daily. Use in each nostril as directed (Patient taking differently: Place 2 sprays into both nostrils 2 (two) times daily as needed for allergies. Use in each nostril as directed) 30 mL 4  . clonazePAM (KLONOPIN) 0.5 MG tablet Take  1 tablet (0.5 mg total) by mouth 3 (three) times daily as needed for anxiety. 90 tablet 2  . cloNIDine (CATAPRES) 0.2 MG tablet TAKE (1) TABLET BY MOUTH TWICE DAILY. 60 tablet 0  . FLUoxetine (PROZAC) 20 MG capsule Take two in the morning and one in the afternoon 90 capsule 2  . gabapentin (NEURONTIN) 400 MG capsule Take 1 capsule (400 mg total) by mouth 3 (three) times daily. 90 capsule 2  . ibuprofen (ADVIL,MOTRIN) 600 MG tablet Take 1 tablet (600 mg total) by mouth every 8 (eight) hours as needed. 30 tablet 0  . Lactobacillus (DIGESTIVE HEALTH PROBIOTIC PO) Take 1 capsule by mouth as needed.     . milk thistle 175 MG tablet Take 175 mg by mouth daily.    . Multiple Vitamins-Minerals (MULTIVITAMIN ADULTS 50+) TABS Take 1 tablet by mouth daily.    Marland Kitchen olopatadine (PATANOL) 0.1 % ophthalmic solution Place 1 drop into both eyes 2 (two) times daily. 5 mL 2  . Omega-3 Fatty Acids (FISH OIL) 1000 MG CAPS Take 1 capsule by mouth daily.    Marland Kitchen omeprazole (PRILOSEC) 20 MG capsule Take 20 mg by mouth daily.    . ondansetron (ZOFRAN) 4 MG tablet One tablet once daily as needed, for uncontrolled nausea 7 tablet 0  . QUEtiapine (SEROQUEL) 50 MG tablet Take one 3 times a day and 2 at bedtime 150 tablet 2  . senna (SENOKOT) 8.6 MG TABS tablet  Take 1 tablet by mouth daily as needed for mild constipation.     No current facility-administered medications for this visit.      Musculoskeletal: Strength & Muscle Tone: decreased Gait & Station: unsteady Patient leans: N/A  Psychiatric Specialty Exam: Review of Systems  HENT: Positive for congestion.   Musculoskeletal: Positive for joint pain.  Neurological: Positive for weakness.  All other systems reviewed and are negative.   Blood pressure (!) 152/94, pulse 93, height 5\' 10"  (1.778 m), weight 167 lb (75.8 kg), SpO2 98 %.Body mass index is 23.96 kg/m.  General Appearance: Casual, Disheveled and Fairly Groomed  Eye Contact:  Fair  Speech:  Clear and  Coherent  Volume:  Decreased  Mood:  Dysphoric  Affect:  Congruent and Constricted  Thought Process:  Goal Directed  Orientation:  Full (Time, Place, and Person)  Thought Content: Rumination   Suicidal Thoughts:  No  Homicidal Thoughts:  No  Memory:  Immediate;   Good Recent;   Good Remote;   Fair  Judgement:  Fair  Insight:  Fair  Psychomotor Activity:  Decreased  Concentration:  Concentration: Fair and Attention Span: Fair  Recall:  [place] of Knowledge: Good  Language: Good  Akathisia:  No  Handed:  Right  AIMS (if indicated): not done  Assets:  Communication Skills Desire for Improvement Resilience Social Support Talents/Skills  ADL's:  Intact  Cognition: WNL  Sleep:  Good   Screenings: PHQ2-9     Office Visit from 03/15/2018 in [facility] from 05/05/2017 in [facility] Office Visit from 01/13/2017 in [facility] Visit from 10/21/2015 in [facility]  PHQ-2 Total Score  4  1  0  4  PHQ-9 Total Score  11  5  -  14       Assessment and Plan:   This patient is a 62 year old male with a history of depression and anxiety. He has had a decline in his overall function recently.  He really needs to get into neurology so we can figure out what is going.  For now however he will continue clonazepam 0.5 mg 3 times daily for anxiety, Prozac 20 mg in the morning and 40 mg in the afternoon for depression, gabapentin 400 mg 3 times daily for anxiety and Seroquel 50 mg 3 times daily and 100 mg at bedtime for mood stabilization.  He will return to see me in 3 months  [name], MD 09/01/2018, 11:54 AM

## 2018-09-01 NOTE — Progress Notes (Deleted)
[place] MD/PA/NP OP Progress Note  09/01/2018 11:54 AM Marc [name]  MRN:  [id number]  Chief Complaint:  Chief Complaint    Anxiety; Depression; Follow-up     HPI: *** Visit Diagnosis:    ICD-10-CM   1. Major depressive disorder, recurrent, moderate (HCC) F33.1     Past Psychiatric History: ***  Past Medical History:  Past Medical History:  Diagnosis Date  . Allergy   . Anxiety   . Arthritis   . Back problem   . Depression   . GERD (gastroesophageal reflux disease)   . Hepatitis C 1979  . Hepatitis C reactive    LIVER BX 2008-CHRONIC ACTIVE HEPATITIS  . HTN (hypertension)   . Hx of adenomatous colonic polyps 2008   due for surveillance 2017  . Hypercholesterolemia   . IBS (irritable bowel syndrome)   . Knee pain   . Leukopenia 11/06/2015  . Normal cardiac stress test 05/25/2011   [facility]  . Normal echocardiogram 05/25/2011   EF 55%, mild TR  . Substance abuse ([place])    narcotic addiction  . Thrombocytopenia ([place]) 11/06/2015    Past Surgical History:  Procedure Laterality Date  . APPENDECTOMY    . CARPAL TUNNEL RELEASE     rt  . COLONOSCOPY  2008 SLF ARS D100 V8 PHEN 12.5   2 SIMPLE ADENOMAS (< 1 CM)  . ESOPHAGOGASTRODUODENOSCOPY  04/26/2012   Dr. Oneida Alar: Non-erosive gastritis (inflammation) was found in the gastric antrum but no H.pylori; multiple biopsies (duodenal bx negative for Celiac)/ The mucosa of the esophagus appeared normal  . EUS  09/08/2012   Dr. Ardis Hughs: CBD dilated but no stones. Query secondary to Sphincter of Oddi stenosis, ?dysfunction, but clinically without symptoms  . HARDWARE REMOVAL Right 02/12/2014   Procedure: HARDWARE REMOVAL;  Surgeon: Jolyn Nap, MD;  Location: [facility];  Service: Orthopedics;  Laterality: Right;  . KNEE ARTHROSCOPY    . Neurostimulator implant    . right ring finger    . spinal stenosis, had screws put in neck  04/11/2009   DR KRITZER  . TONSILLECTOMY    . ULNA OSTEOTOMY Right  02/12/2014   Procedure: RIGHT ULNAR SHORTENING AND OSTEOTOMY ;  Surgeon: Jolyn Nap, MD;  Location: [facility];  Service: Orthopedics;  Laterality: Right;  . ULNAR NERVE TRANSPOSITION    . UPPER GASTROINTESTINAL ENDOSCOPY  2008 SLF ABD PAIN WEIGHT LOSS d100 v8 phen 12.5   NL  . WRIST SURGERY Right jan 2015   Dr. Edmonia Lynch    Family Psychiatric History: ***  Family History:  Family History  Problem Relation Age of Onset  . Bladder Cancer Mother        rare form   . Cancer Sister        Metastatic abdominal  . Emotional abuse Sister   . Stroke Sister   . Emotional abuse Sister   . Heart failure Father   . Stroke Father   . Alcohol abuse Father   . Dementia Paternal Aunt   . Alcohol abuse Paternal Uncle   . Dementia Paternal Uncle   . Dementia Paternal Grandmother   . Stroke Paternal Grandmother   . ADD / ADHD Cousin   . Bipolar disorder Cousin   . Anxiety disorder Cousin   . Depression Cousin        Committed suicide  . Alcohol abuse Cousin   . OCD Cousin   . Paranoid behavior Cousin   .  Brain cancer Maternal Grandfather   . Heart defect Unknown        FAMILY HX  . Colon cancer Maternal Uncle   . Colon polyps Neg Hx   . Drug abuse Neg Hx   . Schizophrenia Neg Hx   . Seizures Neg Hx   . Sexual abuse Neg Hx   . Physical abuse Neg Hx     Social History:  Social History   Socioeconomic History  . Marital status: Divorced    Spouse name: Not on file  . Number of children: Not on file  . Years of education: Not on file  . Highest education level: Not on file  Occupational History  . Not on file  Social Needs  . Financial resource strain: Not on file  . Food insecurity:    Worry: Not on file    Inability: Not on file  . Transportation needs:    Medical: Not on file    Non-medical: Not on file  Tobacco Use  . Smoking status: Former Smoker    Packs/day: 1.50    Years: 8.00    Pack years: 12.00    Types: Cigarettes    Last attempt  to quit: 08/13/1986    Years since quitting: 32.0  . Smokeless tobacco: Never Used  . Tobacco comment: quit 20 + yrs ago  Substance and Sexual Activity  . Alcohol use: No    Alcohol/week: 0.0 standard drinks  . Drug use: No  . Sexual activity: Never  Lifestyle  . Physical activity:    Days per week: Not on file    Minutes per session: Not on file  . Stress: Not on file  Relationships  . Social connections:    Talks on phone: Not on file    Gets together: Not on file    Attends religious service: Not on file    Active member of club or organization: Not on file    Attends meetings of clubs or organizations: Not on file    Relationship status: Not on file  Other Topics Concern  . Not on file  Social History Narrative  . Not on file    Allergies:  Allergies  Allergen Reactions  . Levofloxacin Nausea Only and Other (See Comments)    "Creepy" feeling, skin didn't feel right, sort of itchy.  . Ciprofloxacin     Other reaction(s): Sweating (intolerance)  . Sulfa Antibiotics     Metabolic Disorder Labs: Lab Results  Component Value Date   HGBA1C 5.5 12/30/2017   MPG 111.15 12/30/2017   MPG 111 10/22/2016   No results found for: PROLACTIN Lab Results  Component Value Date   CHOL 229 (H) 10/22/2016   TRIG 134 10/22/2016   HDL 57 10/22/2016   CHOLHDL 4.0 10/22/2016   VLDL 27 10/22/2016   LDLCALC 145 (H) 10/22/2016   LDLCALC 100 10/21/2015   Lab Results  Component Value Date   TSH 2.056 12/30/2017   TSH 1.724 10/22/2016    Therapeutic Level Labs: No results found for: LITHIUM No results found for: VALPROATE No components found for:  CBMZ  Current Medications: Current Outpatient Medications  Medication Sig Dispense Refill  . acetaminophen (TYLENOL) 500 MG tablet Take 500 mg by mouth as needed.    Marland Kitchen aspirin EC 81 MG tablet Take 81 mg by mouth daily.    Marland Kitchen azelastine (ASTELIN) 0.1 % nasal spray Place 2 sprays into both nostrils 2 (two) times daily. Use in each  nostril as directed (  Patient taking differently: Place 2 sprays into both nostrils 2 (two) times daily as needed for allergies. Use in each nostril as directed) 30 mL 4  . clonazePAM (KLONOPIN) 0.5 MG tablet Take 1 tablet (0.5 mg total) by mouth 3 (three) times daily as needed for anxiety. 90 tablet 2  . cloNIDine (CATAPRES) 0.2 MG tablet TAKE (1) TABLET BY MOUTH TWICE DAILY. 60 tablet 0  . FLUoxetine (PROZAC) 20 MG capsule Take two in the morning and one in the afternoon 90 capsule 2  . gabapentin (NEURONTIN) 400 MG capsule Take 1 capsule (400 mg total) by mouth 3 (three) times daily. 90 capsule 2  . ibuprofen (ADVIL,MOTRIN) 600 MG tablet Take 1 tablet (600 mg total) by mouth every 8 (eight) hours as needed. 30 tablet 0  . Lactobacillus (DIGESTIVE HEALTH PROBIOTIC PO) Take 1 capsule by mouth as needed.     . milk thistle 175 MG tablet Take 175 mg by mouth daily.    . Multiple Vitamins-Minerals (MULTIVITAMIN ADULTS 50+) TABS Take 1 tablet by mouth daily.    Marland Kitchen olopatadine (PATANOL) 0.1 % ophthalmic solution Place 1 drop into both eyes 2 (two) times daily. 5 mL 2  . Omega-3 Fatty Acids (FISH OIL) 1000 MG CAPS Take 1 capsule by mouth daily.    Marland Kitchen omeprazole (PRILOSEC) 20 MG capsule Take 20 mg by mouth daily.    . ondansetron (ZOFRAN) 4 MG tablet One tablet once daily as needed, for uncontrolled nausea 7 tablet 0  . QUEtiapine (SEROQUEL) 50 MG tablet Take one 3 times a day and 2 at bedtime 150 tablet 2  . senna (SENOKOT) 8.6 MG TABS tablet Take 1 tablet by mouth daily as needed for mild constipation.     No current facility-administered medications for this visit.      Musculoskeletal: Strength & Muscle Tone: {desc; muscle tone:32375} Gait & Station: {PE GAIT ED NATL:22525} Patient leans: {Patient Leans:21022755}  Psychiatric Specialty Exam: ROS  Blood pressure (!) 152/94, pulse 93, height 5\' 10"  (1.778 m), weight 167 lb (75.8 kg), SpO2 98 %.Body mass index is 23.96 kg/m.  General Appearance:  {Appearance:22683}  Eye Contact:  {BHH EYE CONTACT:22684}  Speech:  {Speech:22685}  Volume:  {Volume (PAA):22686}  Mood:  {BHH MOOD:22306}  Affect:  {Affect (PAA):22687}  Thought Process:  {Thought Process (PAA):22688}  Orientation:  {BHH ORIENTATION (PAA):22689}  Thought Content: {Thought Content:22690}   Suicidal Thoughts:  {ST/HT (PAA):22692}  Homicidal Thoughts:  {ST/HT (PAA):22692}  Memory:  {BHH MEMORY:22881}  Judgement:  {Judgement (PAA):22694}  Insight:  {Insight (PAA):22695}  Psychomotor Activity:  {Psychomotor (PAA):22696}  Concentration:  {Concentration:21399}  Recall:  {BHH GOOD/FAIR/POOR:22877}  Fund of Knowledge: {BHH GOOD/FAIR/POOR:22877}  Language: {BHH GOOD/FAIR/POOR:22877}  Akathisia:  {BHH YES OR NO:22294}  Handed:  {Handed:22697}  AIMS (if indicated): {Desc; done/not:10129}  Assets:  {Assets (PAA):22698}  ADL's:  {BHH QAS'T:41962}  Cognition: {chl bhh cognition:304700322}  Sleep:  {BHH GOOD/FAIR/POOR:22877}   Screenings: PHQ2-9     Office Visit from 03/15/2018 in [facility] from 05/05/2017 in [facility] Office Visit from 01/13/2017 in [facility] Office Visit from 10/21/2015 in [facility]  PHQ-2 Total Score  4  1  0  4  PHQ-9 Total Score  11  5  -  14       Assessment and Plan: ***   [name], MD 09/01/2018, 11:54 AM

## 2018-09-06 ENCOUNTER — Ambulatory Visit: Payer: Self-pay | Admitting: Family Medicine

## 2018-09-12 ENCOUNTER — Telehealth: Payer: Self-pay

## 2018-09-12 ENCOUNTER — Ambulatory Visit: Payer: Self-pay

## 2018-09-12 NOTE — Telephone Encounter (Signed)
Called patient to reschedule AWV, no answer, left a message  

## 2018-09-13 ENCOUNTER — Ambulatory Visit: Payer: Self-pay | Admitting: Neurology

## 2018-09-19 ENCOUNTER — Other Ambulatory Visit: Payer: Self-pay | Admitting: Family Medicine

## 2018-09-20 ENCOUNTER — Ambulatory Visit: Payer: Self-pay | Admitting: Family Medicine

## 2018-10-15 ENCOUNTER — Other Ambulatory Visit: Payer: Self-pay | Admitting: Family Medicine

## 2018-10-26 ENCOUNTER — Other Ambulatory Visit (HOSPITAL_COMMUNITY): Payer: Self-pay | Admitting: Psychiatry

## 2018-10-26 MED ORDER — QUETIAPINE FUMARATE 50 MG PO TABS: 50.0000 mg | ORAL_TABLET | Freq: Four times a day (QID) | ORAL | 2 refills | Status: DC

## 2018-10-31 ENCOUNTER — Telehealth (HOSPITAL_COMMUNITY): Payer: Self-pay | Admitting: *Deleted

## 2018-10-31 NOTE — Telephone Encounter (Signed)
[employer] has Utilization Management Edit Quantity Limit MAX 120 Tabs for 30 Days  Provider has changed script  I tab 4 x daily

## 2018-11-07 ENCOUNTER — Other Ambulatory Visit: Payer: Self-pay | Admitting: Family Medicine

## 2018-12-01 ENCOUNTER — Emergency Department (HOSPITAL_COMMUNITY): Payer: Medicare HMO

## 2018-12-01 ENCOUNTER — Emergency Department (HOSPITAL_COMMUNITY)
Admission: EM | Admit: 2018-12-01 | Discharge: 2018-12-01 | Disposition: A | Discharge status: Home / Self Care | Payer: Medicare HMO | Attending: Emergency Medicine | Admitting: Emergency Medicine

## 2018-12-01 ENCOUNTER — Ambulatory Visit (HOSPITAL_COMMUNITY): Payer: Medicare HMO | Admitting: Psychiatry

## 2018-12-01 ENCOUNTER — Encounter (HOSPITAL_COMMUNITY): Payer: Self-pay

## 2018-12-01 ENCOUNTER — Other Ambulatory Visit: Payer: Self-pay

## 2018-12-01 DIAGNOSIS — Y92000 Kitchen of unspecified non-institutional (private) residence as  the place of occurrence of the external cause: Secondary | ICD-10-CM | POA: Diagnosis not present

## 2018-12-01 DIAGNOSIS — M79672 Pain in left foot: Secondary | ICD-10-CM | POA: Diagnosis not present

## 2018-12-01 DIAGNOSIS — W010XXA Fall on same level from slipping, tripping and stumbling without subsequent striking against object, initial encounter: Secondary | ICD-10-CM | POA: Diagnosis not present

## 2018-12-01 DIAGNOSIS — Z87891 Personal history of nicotine dependence: Secondary | ICD-10-CM | POA: Diagnosis not present

## 2018-12-01 DIAGNOSIS — S9002XA Contusion of left ankle, initial encounter: Secondary | ICD-10-CM | POA: Diagnosis not present

## 2018-12-01 DIAGNOSIS — Z79899 Other long term (current) drug therapy: Secondary | ICD-10-CM | POA: Diagnosis not present

## 2018-12-01 DIAGNOSIS — S93402A Sprain of unspecified ligament of left ankle, initial encounter: Secondary | ICD-10-CM

## 2018-12-01 DIAGNOSIS — Y9389 Activity, other specified: Secondary | ICD-10-CM | POA: Insufficient documentation

## 2018-12-01 DIAGNOSIS — I10 Essential (primary) hypertension: Secondary | ICD-10-CM | POA: Diagnosis not present

## 2018-12-01 DIAGNOSIS — S99912A Unspecified injury of left ankle, initial encounter: Secondary | ICD-10-CM | POA: Diagnosis present

## 2018-12-01 DIAGNOSIS — Y999 Unspecified external cause status: Secondary | ICD-10-CM | POA: Diagnosis not present

## 2018-12-01 DIAGNOSIS — S9032XA Contusion of left foot, initial encounter: Secondary | ICD-10-CM

## 2018-12-01 MED ORDER — KETOROLAC TROMETHAMINE 30 MG/ML IJ SOLN
30.0000 mg | Freq: Once | INTRAMUSCULAR | Status: AC
  Administered 2018-12-01: 30 mg via INTRAMUSCULAR
  Filled 2018-12-01: qty 1

## 2018-12-01 MED ORDER — TRAMADOL HCL 50 MG PO TABS: 50.0000 mg | ORAL_TABLET | Freq: Four times a day (QID) | ORAL | 0 refills | Status: DC | PRN

## 2018-12-01 NOTE — ED Triage Notes (Signed)
Pt states that a few days ago, pt couldn't walk on left ankle. States it got worse. No injury noted.

## 2018-12-01 NOTE — Discharge Instructions (Addendum)
Your x-rays are negative for fracture or dislocation.  Your examination is negative for any deficits in your neurologic or vascular system.  Your examination suggest an ankle sprain and contusions and bruises to your foot.  Please use the ankle stirrup splint over the next week.  Please use the postoperative canvas shoe until you can safely put your foot in your regular shoe.  Use your crutches until you can safely put weight on the left lower extremity.  Please see your primary physician for additional evaluation and management.  Use Ultram every 6 hours as needed for pain and discomfort.  Keep your foot elevated above your waist when sitting and above your heart when lying down.

## 2018-12-01 NOTE — ED Provider Notes (Signed)
Marc [name] Provider Note   CSN: [id number] Arrival date & time: 12/01/18  1623    History   Chief Complaint Chief Complaint  Patient presents with  . Ankle Pain    HPI Marc [name] is a 63 y.o. male.     Patient is a 63 year old male who presents to the emergency department with complaint of left foot and ankle pain.  The patient states that 2 days ago he slipped while walking in his kitchen and injured the left foot and ankle.  He started having some swelling, he tried some conservative measures but it seemed to have been getting better, but today when he attempted to walk his dog the pain got severely worse.  When he got back in his home and laid down he noticed that the swelling was also worse accompanied by redness.  He was concerned that he may have broken a bone and he presents now for assistance with this issue.  No other injury reported.  The patient denies being on any anticoagulation medications.  The history is provided by the patient.    Past Medical History:  Diagnosis Date  . Allergy   . Anxiety   . Arthritis   . Back problem   . Depression   . GERD (gastroesophageal reflux disease)   . Hepatitis C 1979  . Hepatitis C reactive    LIVER BX 2008-CHRONIC ACTIVE HEPATITIS  . HTN (hypertension)   . Hx of adenomatous colonic polyps 2008   due for surveillance 2017  . Hypercholesterolemia   . IBS (irritable bowel syndrome)   . Knee pain   . Leukopenia 11/06/2015  . Normal cardiac stress test 05/25/2011   [facility]  . Normal echocardiogram 05/25/2011   EF 55%, mild TR  . Substance abuse ([place])    narcotic addiction  . Thrombocytopenia ([place]) 11/06/2015    Patient Active Problem List   Diagnosis Date Noted  . Encephalopathy acute 12/30/2017  . Acute encephalopathy 12/29/2017  . Left forearm pain 08/15/2017  . Acute pain of left wrist 08/15/2017  . Chest wall pain 03/28/2017  . Annual physical exam 10/20/2016  .  History of colonic polyps 11/13/2015  . Leukopenia 11/06/2015  . Thrombocytopenia ([place]) 11/06/2015  . Back muscle spasm 10/21/2015  . HCV (hepatitis C virus) 06/16/2015  . Nocturia 06/16/2015  . MDD (major depressive disorder) 01/21/2015  . Seasonal allergies 12/27/2014  . Dilation of biliary tract 07/14/2012  . Epigastric pain 07/14/2012  . RUQ pain 07/14/2012  . Trigger middle finger of left hand 06/20/2012  . Vitamin D deficiency 06/20/2012  . Chronic pain syndrome 12/24/2011  . Narcotic addiction ([place]) 06/10/2011  . Essential hypertension 09/06/2010  . Prediabetes 04/10/2010  . INSOMNIA 11/07/2009  . Anxiety and depression 09/04/2009  . Chronic hepatitis C without hepatic coma ([place]) 01/04/2009  . IRRITABLE BOWEL SYNDROME 01/04/2009  . GERD 01/03/2009    Past Surgical History:  Procedure Laterality Date  . APPENDECTOMY    . CARPAL TUNNEL RELEASE     rt  . COLONOSCOPY  2008 SLF ARS D100 V8 PHEN 12.5   2 SIMPLE ADENOMAS (< 1 CM)  . ESOPHAGOGASTRODUODENOSCOPY  04/26/2012   Dr. Oneida Alar: Non-erosive gastritis (inflammation) was found in the gastric antrum but no H.pylori; multiple biopsies (duodenal bx negative for Celiac)/ The mucosa of the esophagus appeared normal  . EUS  09/08/2012   Dr. Ardis Hughs: CBD dilated but no stones. Query secondary to Sphincter of Oddi stenosis, ?dysfunction,  but clinically without symptoms  . HARDWARE REMOVAL Right 02/12/2014   Procedure: HARDWARE REMOVAL;  Surgeon: Jolyn Nap, MD;  Location: [facility];  Service: Orthopedics;  Laterality: Right;  . KNEE ARTHROSCOPY    . Neurostimulator implant    . right ring finger    . spinal stenosis, had screws put in neck  04/11/2009   DR KRITZER  . TONSILLECTOMY    . ULNA OSTEOTOMY Right 02/12/2014   Procedure: RIGHT ULNAR SHORTENING AND OSTEOTOMY ;  Surgeon: Jolyn Nap, MD;  Location: [facility];  Service: Orthopedics;  Laterality: Right;  . ULNAR NERVE TRANSPOSITION    .  UPPER GASTROINTESTINAL ENDOSCOPY  2008 SLF ABD PAIN WEIGHT LOSS d100 v8 phen 12.5   NL  . WRIST SURGERY Right jan 2015   Dr. Edmonia Lynch        Home Medications    Prior to Admission medications   Medication Sig Start Date End Date Taking? Authorizing Provider  acetaminophen (TYLENOL) 500 MG tablet Take 500 mg by mouth as needed.    [provider]  aspirin EC 81 MG tablet Take 81 mg by mouth daily.    [provider]  azelastine (ASTELIN) 0.1 % nasal spray Place 2 sprays into both nostrils 2 (two) times daily. Use in each nostril as directed Patient taking differently: Place 2 sprays into both nostrils 2 (two) times daily as needed for allergies. Use in each nostril as directed 01/13/17   Fayrene Helper, MD  clonazePAM (KLONOPIN) 0.5 MG tablet Take 1 tablet (0.5 mg total) by mouth 3 (three) times daily as needed for anxiety. 09/01/18 09/01/19  Cloria Spring, MD  cloNIDine (CATAPRES) 0.2 MG tablet TAKE (1) TABLET BY MOUTH TWICE DAILY. 11/07/18   Fayrene Helper, MD  FLUoxetine (PROZAC) 20 MG capsule Take two in the morning and one in the afternoon 09/01/18   Cloria Spring, MD  gabapentin (NEURONTIN) 400 MG capsule Take 1 capsule (400 mg total) by mouth 3 (three) times daily. 09/01/18 09/01/19  Cloria Spring, MD  ibuprofen (ADVIL,MOTRIN) 600 MG tablet Take 1 tablet (600 mg total) by mouth every 8 (eight) hours as needed. 08/12/17   Fayrene Helper, MD  Lactobacillus (DIGESTIVE HEALTH PROBIOTIC PO) Take 1 capsule by mouth as needed.     [provider]  milk thistle 175 MG tablet Take 175 mg by mouth daily.    [provider]  Multiple Vitamins-Minerals (MULTIVITAMIN ADULTS 50+) TABS Take 1 tablet by mouth daily.    [provider]  olopatadine (PATANOL) 0.1 % ophthalmic solution Place 1 drop into both eyes 2 (two) times daily. 01/13/17   Fayrene Helper, MD  Omega-3 Fatty Acids (FISH OIL) 1000 MG CAPS Take 1 capsule by mouth daily.     [provider]  omeprazole (PRILOSEC) 20 MG capsule Take 20 mg by mouth daily.    [provider]  ondansetron (ZOFRAN) 4 MG tablet One tablet once daily as needed, for uncontrolled nausea 08/11/17   Fayrene Helper, MD  QUEtiapine (SEROQUEL) 50 MG tablet Take 1 tablet (50 mg total) by mouth 4 (four) times daily. 10/26/18   Cloria Spring, MD  senna (SENOKOT) 8.6 MG TABS tablet Take 1 tablet by mouth daily as needed for mild constipation.    [provider]    Family History Family History  Problem Relation Age of Onset  . Bladder Cancer Mother  rare form   . Cancer Sister        Metastatic abdominal  . Emotional abuse Sister   . Stroke Sister   . Emotional abuse Sister   . Heart failure Father   . Stroke Father   . Alcohol abuse Father   . Dementia Paternal Aunt   . Alcohol abuse Paternal Uncle   . Dementia Paternal Uncle   . Dementia Paternal Grandmother   . Stroke Paternal Grandmother   . ADD / ADHD Cousin   . Bipolar disorder Cousin   . Anxiety disorder Cousin   . Depression Cousin        Committed suicide  . Alcohol abuse Cousin   . OCD Cousin   . Paranoid behavior Cousin   . Brain cancer Maternal Grandfather   . Heart defect Other        FAMILY HX  . Colon cancer Maternal Uncle   . Colon polyps Neg Hx   . Drug abuse Neg Hx   . Schizophrenia Neg Hx   . Seizures Neg Hx   . Sexual abuse Neg Hx   . Physical abuse Neg Hx     Social History Social History   Tobacco Use  . Smoking status: Former Smoker    Packs/day: 1.50    Years: 8.00    Pack years: 12.00    Types: Cigarettes    Last attempt to quit: 08/13/1986    Years since quitting: 32.3  . Smokeless tobacco: Never Used  . Tobacco comment: quit 20 + yrs ago  Substance Use Topics  . Alcohol use: No    Alcohol/week: 0.0 standard drinks  . Drug use: No     Allergies   Levofloxacin; Ciprofloxacin; and Sulfa antibiotics   Review of Systems Review of Systems   Constitutional: Negative for activity change.       All ROS Neg except as noted in HPI  HENT: Negative for nosebleeds.   Eyes: Negative for photophobia and discharge.  Respiratory: Negative for cough, shortness of breath and wheezing.   Cardiovascular: Negative for chest pain and palpitations.  Gastrointestinal: Negative for abdominal pain and blood in stool.  Genitourinary: Negative for dysuria, frequency and hematuria.  Musculoskeletal: Positive for arthralgias. Negative for back pain and neck pain.       Foot pain  Skin: Negative.   Neurological: Negative for dizziness, seizures and speech difficulty.  Psychiatric/Behavioral: Negative for confusion and hallucinations.     Physical Exam Updated Vital Signs BP 123/86 (BP Location: Right Arm)   Pulse 83   Temp (!) 97.5 F (36.4 C) (Oral)   Resp 16   Ht 5\' 10"  (1.778 m)   Wt 71.7 kg   SpO2 98%   BMI 22.67 kg/m   Physical Exam Vitals signs and nursing note reviewed.  Constitutional:      Appearance: He is well-developed. He is not toxic-appearing.  HENT:     Head: Normocephalic.     Right Ear: Tympanic membrane and external ear normal.     Left Ear: Tympanic membrane and external ear normal.  Eyes:     General: Lids are normal.     Pupils: Pupils are equal, round, and reactive to light.  Neck:     Musculoskeletal: Normal range of motion and neck supple.     Vascular: No carotid bruit.  Cardiovascular:     Rate and Rhythm: Normal rate and regular rhythm.     Pulses: Normal pulses.     Heart sounds:  Normal heart sounds.  Pulmonary:     Effort: No respiratory distress.     Breath sounds: Normal breath sounds.  Abdominal:     General: Bowel sounds are normal.     Palpations: Abdomen is soft.     Tenderness: There is no abdominal tenderness. There is no guarding.  Musculoskeletal: Normal range of motion.     Left ankle: He exhibits swelling. Tenderness. Lateral malleolus and medial malleolus tenderness found. Achilles  tendon normal.       Feet:  Lymphadenopathy:     Head:     Right side of head: No submandibular adenopathy.     Left side of head: No submandibular adenopathy.     Cervical: No cervical adenopathy.  Skin:    General: Skin is warm and dry.  Neurological:     Mental Status: He is alert and oriented to person, place, and time.     Cranial Nerves: No cranial nerve deficit.     Sensory: No sensory deficit.  Psychiatric:        Speech: Speech normal.      ED Treatments / Results  Labs (all labs ordered are listed, but only abnormal results are displayed) Labs Reviewed - No data to display  EKG None  Radiology No results found.  Procedures Procedures (including critical care time)  Medications Ordered in ED Medications - No data to display   Initial Impression / Assessment and Plan / ED Course  I have reviewed the triage vital signs and the nursing notes.  Pertinent labs & imaging results that were available during my care of the patient were reviewed by me and considered in my medical decision making (see chart for details).          Final Clinical Impressions(s) / ED Diagnoses MDM  Vital signs reviewed.  Patient injured the left foot and ankle during a fall.  No neurovascular deficits appreciated.  The x-ray of the left ankle is negative for fracture or dislocation.  The x-ray of the left foot is negative for fracture or dislocation.  Patient will be treated for ankle sprain and contusions to the foot.  Ankle stirrup splint has been ordered as well as a postoperative shoe and crutches.  Patient will be placed on Ultram for assistance with the pain and discomfort.  I have asked the patient to elevate his legs when at rest.  Patient to follow-up with his primary physician for additional evaluation and management.   Final diagnoses:  Sprain of left ankle, unspecified ligament, initial encounter  Contusion of left foot, initial encounter    ED Discharge Orders          Ordered    traMADol (ULTRAM) 50 MG tablet  Every 6 hours PRN     12/01/18 1809           Lily Kocher, PA-C 12/01/18 1811    Francine Graven, DO 12/05/18 [phone]

## 2018-12-07 ENCOUNTER — Other Ambulatory Visit: Payer: Self-pay

## 2018-12-07 ENCOUNTER — Ambulatory Visit: Payer: Medicare HMO | Admitting: Family Medicine

## 2018-12-09 ENCOUNTER — Other Ambulatory Visit (HOSPITAL_COMMUNITY): Payer: Self-pay | Admitting: Psychiatry

## 2018-12-20 ENCOUNTER — Encounter (HOSPITAL_COMMUNITY): Payer: Self-pay | Admitting: Emergency Medicine

## 2018-12-20 ENCOUNTER — Other Ambulatory Visit: Payer: Self-pay

## 2018-12-20 ENCOUNTER — Emergency Department (HOSPITAL_COMMUNITY)
Admission: EM | Admit: 2018-12-20 | Discharge: 2018-12-20 | Disposition: A | Discharge status: Home / Self Care | Payer: Medicare HMO | Attending: Emergency Medicine | Admitting: Emergency Medicine

## 2018-12-20 ENCOUNTER — Emergency Department (HOSPITAL_COMMUNITY): Payer: Medicare HMO

## 2018-12-20 DIAGNOSIS — I10 Essential (primary) hypertension: Secondary | ICD-10-CM | POA: Diagnosis not present

## 2018-12-20 DIAGNOSIS — S6992XA Unspecified injury of left wrist, hand and finger(s), initial encounter: Secondary | ICD-10-CM | POA: Diagnosis not present

## 2018-12-20 DIAGNOSIS — R600 Localized edema: Secondary | ICD-10-CM | POA: Diagnosis not present

## 2018-12-20 DIAGNOSIS — F329 Major depressive disorder, single episode, unspecified: Secondary | ICD-10-CM | POA: Insufficient documentation

## 2018-12-20 DIAGNOSIS — S52615D Nondisplaced fracture of left ulna styloid process, subsequent encounter for closed fracture with routine healing: Secondary | ICD-10-CM | POA: Diagnosis not present

## 2018-12-20 DIAGNOSIS — S6992XD Unspecified injury of left wrist, hand and finger(s), subsequent encounter: Secondary | ICD-10-CM | POA: Diagnosis present

## 2018-12-20 DIAGNOSIS — M7989 Other specified soft tissue disorders: Secondary | ICD-10-CM

## 2018-12-20 DIAGNOSIS — Z87891 Personal history of nicotine dependence: Secondary | ICD-10-CM | POA: Insufficient documentation

## 2018-12-20 DIAGNOSIS — F419 Anxiety disorder, unspecified: Secondary | ICD-10-CM | POA: Insufficient documentation

## 2018-12-20 DIAGNOSIS — M25532 Pain in left wrist: Secondary | ICD-10-CM | POA: Diagnosis not present

## 2018-12-20 DIAGNOSIS — Z7982 Long term (current) use of aspirin: Secondary | ICD-10-CM | POA: Diagnosis not present

## 2018-12-20 DIAGNOSIS — Z79899 Other long term (current) drug therapy: Secondary | ICD-10-CM | POA: Insufficient documentation

## 2018-12-20 DIAGNOSIS — X509XXA Other and unspecified overexertion or strenuous movements or postures, initial encounter: Secondary | ICD-10-CM | POA: Diagnosis not present

## 2018-12-20 DIAGNOSIS — R52 Pain, unspecified: Secondary | ICD-10-CM

## 2018-12-20 MED ORDER — NAPROXEN 500 MG PO TABS: 500.0000 mg | ORAL_TABLET | Freq: Two times a day (BID) | ORAL | 0 refills | Status: DC

## 2018-12-20 MED ORDER — CEPHALEXIN 500 MG PO CAPS: 500.0000 mg | ORAL_CAPSULE | Freq: Three times a day (TID) | ORAL | 0 refills | Status: AC

## 2018-12-20 NOTE — Discharge Instructions (Signed)
Please take Keflex, 500 mg 3 times a day for the next 10 days to help treat any potential infections that may be causing your pain and swelling.  I would also encourage you to take naproxen, 500 mg twice a day to help with pain and swelling.  Please apply ice and keep your hand elevated, you may use the splint for symptomatic relief and stabilization.  If you should develop increasing pain redness or swelling of the arm please seek medical exam immediately.

## 2018-12-20 NOTE — ED Provider Notes (Signed)
[facility] EMERGENCY DEPARTMENT Provider Note   CSN: [id number] Arrival date & time: 12/20/18  1354    History   Chief Complaint Chief Complaint  Patient presents with  . Wrist Pain    HPI Marc [name] is a 63 y.o. male.     HPI  63 year old male, multiple medical problems including substance abuse with a narcotic addiction, history of hypertension hepatitis C and arthritis.  He states that approximately 8 or 9 days ago while he was walking his dog, the dog ran very quickly and darted after another animal, he was holding the leash with his left hand and wrist.  This caused an acute jerking motion at that site and states he has had pain since that time.  He has had increased swelling which is been progressive over the last week.  He denies any fevers.  He states the pain is worse with range of motion of the wrist and the hand.  He denies any numbness or weakness of the hand however he has had a history of an ulnar nerve transfer at the elbow in the past and has an abnormal neurologic exam at baseline.  He has not had a previous evaluation for this injury.  Past Medical History:  Diagnosis Date  . Allergy   . Anxiety   . Arthritis   . Back problem   . Depression   . GERD (gastroesophageal reflux disease)   . Hepatitis C 1979  . Hepatitis C reactive    LIVER BX 2008-CHRONIC ACTIVE HEPATITIS  . HTN (hypertension)   . Hx of adenomatous colonic polyps 2008   due for surveillance 2017  . Hypercholesterolemia   . IBS (irritable bowel syndrome)   . Knee pain   . Leukopenia 11/06/2015  . Normal cardiac stress test 05/25/2011   [facility] [place]  . Normal echocardiogram 05/25/2011   EF 55%, mild TR  . Substance abuse ([place])    narcotic addiction  . Thrombocytopenia ([place]) 11/06/2015    Patient Active Problem List   Diagnosis Date Noted  . Encephalopathy acute 12/30/2017  . Acute encephalopathy 12/29/2017  . Left forearm pain 08/15/2017  . Acute pain of left  wrist 08/15/2017  . Chest wall pain 03/28/2017  . Annual physical exam 10/20/2016  . History of colonic polyps 11/13/2015  . Leukopenia 11/06/2015  . Thrombocytopenia ([place]) 11/06/2015  . Back muscle spasm 10/21/2015  . HCV (hepatitis C virus) 06/16/2015  . Nocturia 06/16/2015  . MDD (major depressive disorder) 01/21/2015  . Seasonal allergies 12/27/2014  . Dilation of biliary tract 07/14/2012  . Epigastric pain 07/14/2012  . RUQ pain 07/14/2012  . Trigger middle finger of left hand 06/20/2012  . Vitamin D deficiency 06/20/2012  . Chronic pain syndrome 12/24/2011  . Narcotic addiction ([place]) 06/10/2011  . Essential hypertension 09/06/2010  . Prediabetes 04/10/2010  . INSOMNIA 11/07/2009  . Anxiety and depression 09/04/2009  . Chronic hepatitis C without hepatic coma ([place]) 01/04/2009  . IRRITABLE BOWEL SYNDROME 01/04/2009  . GERD 01/03/2009    Past Surgical History:  Procedure Laterality Date  . APPENDECTOMY    . CARPAL TUNNEL RELEASE     rt  . COLONOSCOPY  2008 [facility] ARS D100 V8 PHEN 12.5   2 SIMPLE ADENOMAS (< 1 CM)  . ESOPHAGOGASTRODUODENOSCOPY  04/26/2012   Dr. Oneida Alar: Non-erosive gastritis (inflammation) was found in the gastric antrum but no H.pylori; multiple biopsies (duodenal bx negative for Celiac)/ The mucosa of the esophagus appeared normal  . EUS  09/08/2012   Dr. Ardis Hughs: CBD dilated but no stones. Query secondary to Sphincter of Oddi stenosis, ?dysfunction, but clinically without symptoms  . HARDWARE REMOVAL Right 02/12/2014   Procedure: HARDWARE REMOVAL;  Surgeon: Jolyn Nap, MD;  Location: [facility];  Service: Orthopedics;  Laterality: Right;  . KNEE ARTHROSCOPY    . Neurostimulator implant    . right ring finger    . spinal stenosis, had screws put in neck  04/11/2009   DR KRITZER  . TONSILLECTOMY    . ULNA OSTEOTOMY Right 02/12/2014   Procedure: RIGHT ULNAR SHORTENING AND OSTEOTOMY ;  Surgeon: Jolyn Nap, MD;  Location: [facility];  Service: Orthopedics;  Laterality: Right;  . ULNAR NERVE TRANSPOSITION    . UPPER GASTROINTESTINAL ENDOSCOPY  2008 [facility]F ABD PAIN WEIGHT LOSS d100 v8 phen 12.5   NL  . WRIST SURGERY Right jan 2015   Dr. Edmonia Lynch        Home Medications    Prior to Admission medications   Medication Sig Start Date End Date Taking? Authorizing Provider  acetaminophen (TYLENOL) 500 MG tablet Take 500 mg by mouth as needed.    [provider]  aspirin EC 81 MG tablet Take 81 mg by mouth daily.    [provider]  azelastine (ASTELIN) 0.1 % nasal spray Place 2 sprays into both nostrils 2 (two) times daily. Use in each nostril as directed Patient taking differently: Place 2 sprays into both nostrils 2 (two) times daily as needed for allergies. Use in each nostril as directed 01/13/17   Fayrene Helper, MD  cephALEXin (KEFLEX) 500 MG capsule Take 1 capsule (500 mg total) by mouth 3 (three) times daily for 10 days. 12/20/18 12/30/18  Noemi Chapel, MD  clonazePAM (KLONOPIN) 0.5 MG tablet TAKE 1 TABLET BY MOUTH 3 TIMES A DAY AS NEEDED FOR ANXIETY. 12/09/18   Cloria Spring, MD  cloNIDine (CATAPRES) 0.2 MG tablet TAKE (1) TABLET BY MOUTH TWICE DAILY. 11/07/18   Fayrene Helper, MD  FLUoxetine (PROZAC) 20 MG capsule Take two in the morning and one in the afternoon 09/01/18   Cloria Spring, MD  gabapentin (NEURONTIN) 400 MG capsule Take 1 capsule (400 mg total) by mouth 3 (three) times daily. 09/01/18 09/01/19  Cloria Spring, MD  ibuprofen (ADVIL,MOTRIN) 600 MG tablet Take 1 tablet (600 mg total) by mouth every 8 (eight) hours as needed. 08/12/17   Fayrene Helper, MD  Lactobacillus (DIGESTIVE HEALTH PROBIOTIC PO) Take 1 capsule by mouth as needed.     [provider]  milk thistle 175 MG tablet Take 175 mg by mouth daily.    [provider]  Multiple Vitamins-Minerals (MULTIVITAMIN ADULTS 50+) TABS Take 1 tablet by mouth daily.    [provider]   naproxen (NAPROSYN) 500 MG tablet Take 1 tablet (500 mg total) by mouth 2 (two) times daily with a meal. 12/20/18   Noemi Chapel, MD  olopatadine (PATANOL) 0.1 % ophthalmic solution Place 1 drop into both eyes 2 (two) times daily. 01/13/17   Fayrene Helper, MD  Omega-3 Fatty Acids (FISH OIL) 1000 MG CAPS Take 1 capsule by mouth daily.    [provider]  omeprazole (PRILOSEC) 20 MG capsule Take 20 mg by mouth daily.    [provider]  ondansetron (ZOFRAN) 4 MG tablet One tablet once daily as needed, for uncontrolled nausea 08/11/17   Fayrene Helper, MD  QUEtiapine (SEROQUEL)  50 MG tablet Take 1 tablet (50 mg total) by mouth 4 (four) times daily. 10/26/18   Cloria Spring, MD  senna (SENOKOT) 8.6 MG TABS tablet Take 1 tablet by mouth daily as needed for mild constipation.    [provider]  traMADol (ULTRAM) 50 MG tablet Take 1 tablet (50 mg total) by mouth every 6 (six) hours as needed. 12/01/18   Lily Kocher, PA-C    Family History Family History  Problem Relation Age of Onset  . Bladder Cancer Mother        rare form   . Cancer Sister        Metastatic abdominal  . Emotional abuse Sister   . Stroke Sister   . Emotional abuse Sister   . Heart failure Father   . Stroke Father   . Alcohol abuse Father   . Dementia Paternal Aunt   . Alcohol abuse Paternal Uncle   . Dementia Paternal Uncle   . Dementia Paternal Grandmother   . Stroke Paternal Grandmother   . ADD / ADHD Cousin   . Bipolar disorder Cousin   . Anxiety disorder Cousin   . Depression Cousin        Committed suicide  . Alcohol abuse Cousin   . OCD Cousin   . Paranoid behavior Cousin   . Brain cancer Maternal Grandfather   . Heart defect Other        FAMILY HX  . Colon cancer Maternal Uncle   . Colon polyps Neg Hx   . Drug abuse Neg Hx   . Schizophrenia Neg Hx   . Seizures Neg Hx   . Sexual abuse Neg Hx   . Physical abuse Neg Hx     Social History Social History   Tobacco  Use  . Smoking status: Former Smoker    Packs/day: 1.50    Years: 8.00    Pack years: 12.00    Types: Cigarettes    Last attempt to quit: 08/13/1986    Years since quitting: 32.3  . Smokeless tobacco: Never Used  . Tobacco comment: quit 20 + yrs ago  Substance Use Topics  . Alcohol use: No    Alcohol/week: 0.0 standard drinks  . Drug use: No     Allergies   Levofloxacin; Ciprofloxacin; and Sulfa antibiotics   Review of Systems Review of Systems  All other systems reviewed and are negative.    Physical Exam Updated Vital Signs BP 94/68   Pulse 73   Temp 97.9 F (36.6 C)   Resp 16   Ht 1.778 m (5\' 10" )   Wt 72 kg   SpO2 98%   BMI 22.78 kg/m   Physical Exam Vitals signs and nursing note reviewed.  Constitutional:      General: He is not in acute distress.    Appearance: He is well-developed. He is not diaphoretic.  HENT:     Head: Normocephalic and atraumatic.  Eyes:     General: No scleral icterus.    Conjunctiva/sclera: Conjunctivae normal.  Cardiovascular:     Rate and Rhythm: Normal rate and regular rhythm.  Pulmonary:     Effort: Pulmonary effort is normal.     Breath sounds: Normal breath sounds.  Musculoskeletal:        General: Swelling and tenderness present. No deformity.     Comments: Flexor. and extensor seems to work without any pain against resistance.  Pulses normal at the left wrist, perfusion is normal.  Normal range of  motion of the left elbow.  Compartments are soft in the forearmThere is focal tenderness at the left wrist at the ulnar styloid with decreased range of motion of the left wrist.  The swelling and edema extends into the hand and the fingers.  The patient is able to make a fist, he is able to extend his fingers, his elbow is normal, compartments of the forearm are soft and nontender  Mild redness to dorsum of wrist  Skin:    General: Skin is warm and dry.     Findings: Rash present.     Comments: Mild redness to the dorsum of  the hand as above  Neurological:     Mental Status: He is alert.     Comments: Normal strength and sensation of the left upper extremity all the way down to the hand.      ED Treatments / Results  Labs (all labs ordered are listed, but only abnormal results are displayed) Labs Reviewed - No data to display  EKG None  Radiology Dg Wrist Complete Left  Result Date: 12/20/2018 CLINICAL DATA:  Left hand and wrist pain with swelling. Injury getting caught in a dog leash. EXAM: LEFT WRIST - COMPLETE 3+ VIEW COMPARISON:  Radiographs from 12/18/2017 FINDINGS: Faint linear lucency along the ulnar styloid not readily appreciable on 08/28/2017, a subtle finding but possibly from a nondisplaced fracture. Subcutaneous edema dorsal to the wrist and hand. Spurring of the scaphoid laterally. Lucency along the ulnar margin of the lunate, possibly from mild ulnolunate abutment. IMPRESSION: 1. Suspicion for nondisplaced ulnar styloid fracture, correlate with point tenderness. 2. Subcortical lucencies along the ulnar side of the lunate with mild positive ulnar variance, cannot exclude ulnolunate abutment. Electronically Signed   By: Van Clines M.D.   On: 12/20/2018 14:56   Dg Hand Complete Left  Result Date: 12/20/2018 CLINICAL DATA:  Left hand and wrist pain with swelling. Tangled in dog leash about 10 days ago. EXAM: LEFT HAND - COMPLETE 3+ VIEW COMPARISON:  Multiple exams, including August 11, 2017 and January 08, 2009 FINDINGS: Dorsal subcutaneous edema along the hand. There is also some subcutaneous edema dorsal to the wrist. Subtle indistinct lucency along the ulnar styloid suspicious for a nondisplaced ulnar styloid fracture, correlate with point tenderness. IMPRESSION: 1. Subcutaneous edema along the dorsum of the hand and wrist. Subtle lucency in the ulnar styloid is suspicious for a fracture, correlate with point tenderness. Electronically Signed   By: Van Clines M.D.   On: 12/20/2018 14:53     Procedures Procedures (including critical care time)  Medications Ordered in ED Medications - No data to display   Initial Impression / Assessment and Plan / ED Course  I have reviewed the triage vital signs and the nursing notes.  Pertinent labs & imaging results that were available during my care of the patient were reviewed by me and considered in my medical decision making (see chart for details).        The patient has a focal injury to the left wrist and hand.  He does have some tenderness over that mid wrist, he will need to have imaging in the form of plain films, he does not have any fever there is no redness or streaking to the skin and he does not have any signs of infection otherwise.  There is no breaks of the skin, normal flexor and extensor tendon function, no neurologic deficits given his baseline ulnar nerve transfer.  I have personally looked  at the x-rays, I agree with radiologist that there is likely a nondisplaced fracture of the distal ulnar styloid as this is point tender.  He will be placed in a Velcro wrist splint, he will need to follow-up with his family doctor, this is a nonsurgical injury.  He may have some element of cellulitis on the back of the wrist however he has not had any fevers and correlates the swelling with the trauma that he had 8 or 9 days ago.  Will place in a wrist splint  Splint placed, patient has good neurovascular function after placement.  Keflex and naproxen for home.  Patient agreeable to return should symptoms worsen.  Final Clinical Impressions(s) / ED Diagnoses   Final diagnoses:  Closed nondisplaced fracture of styloid process of left ulna with routine healing, subsequent encounter  Swelling of left hand    ED Discharge Orders         Ordered    naproxen (NAPROSYN) 500 MG tablet  2 times daily with meals     12/20/18 1605    cephALEXin (KEFLEX) 500 MG capsule  3 times daily     12/20/18 1605           Noemi Chapel, MD 12/20/18 [phone]

## 2018-12-20 NOTE — ED Triage Notes (Signed)
Pt c/o left hand and wrist pain and swelling. Pt states his hand and arm got tangled in dog lease as dog was running.

## 2018-12-23 DIAGNOSIS — M25572 Pain in left ankle and joints of left foot: Secondary | ICD-10-CM | POA: Diagnosis not present

## 2018-12-23 DIAGNOSIS — R2242 Localized swelling, mass and lump, left lower limb: Secondary | ICD-10-CM | POA: Diagnosis not present

## 2018-12-23 DIAGNOSIS — W1839XA Other fall on same level, initial encounter: Secondary | ICD-10-CM | POA: Diagnosis not present

## 2018-12-23 DIAGNOSIS — Y999 Unspecified external cause status: Secondary | ICD-10-CM | POA: Diagnosis not present

## 2018-12-23 DIAGNOSIS — M7989 Other specified soft tissue disorders: Secondary | ICD-10-CM | POA: Diagnosis not present

## 2018-12-23 DIAGNOSIS — F1010 Alcohol abuse, uncomplicated: Secondary | ICD-10-CM | POA: Diagnosis not present

## 2018-12-23 DIAGNOSIS — S92345A Nondisplaced fracture of fourth metatarsal bone, left foot, initial encounter for closed fracture: Secondary | ICD-10-CM | POA: Diagnosis not present

## 2018-12-24 DIAGNOSIS — F10239 Alcohol dependence with withdrawal, unspecified: Secondary | ICD-10-CM | POA: Diagnosis not present

## 2018-12-24 DIAGNOSIS — F1020 Alcohol dependence, uncomplicated: Secondary | ICD-10-CM | POA: Diagnosis not present

## 2018-12-24 DIAGNOSIS — F13239 Sedative, hypnotic or anxiolytic dependence with withdrawal, unspecified: Secondary | ICD-10-CM | POA: Diagnosis not present

## 2018-12-24 DIAGNOSIS — F13229 Sedative, hypnotic or anxiolytic dependence with intoxication, unspecified: Secondary | ICD-10-CM | POA: Diagnosis not present

## 2018-12-24 DIAGNOSIS — S92345A Nondisplaced fracture of fourth metatarsal bone, left foot, initial encounter for closed fracture: Secondary | ICD-10-CM | POA: Diagnosis not present

## 2018-12-24 DIAGNOSIS — F1010 Alcohol abuse, uncomplicated: Secondary | ICD-10-CM | POA: Diagnosis not present

## 2018-12-24 DIAGNOSIS — F1320 Sedative, hypnotic or anxiolytic dependence, uncomplicated: Secondary | ICD-10-CM | POA: Diagnosis not present

## 2018-12-27 DIAGNOSIS — F1020 Alcohol dependence, uncomplicated: Secondary | ICD-10-CM | POA: Diagnosis not present

## 2018-12-27 DIAGNOSIS — F10239 Alcohol dependence with withdrawal, unspecified: Secondary | ICD-10-CM | POA: Diagnosis not present

## 2018-12-27 DIAGNOSIS — F13239 Sedative, hypnotic or anxiolytic dependence with withdrawal, unspecified: Secondary | ICD-10-CM | POA: Diagnosis not present

## 2018-12-27 DIAGNOSIS — F13229 Sedative, hypnotic or anxiolytic dependence with intoxication, unspecified: Secondary | ICD-10-CM | POA: Diagnosis not present

## 2018-12-27 DIAGNOSIS — F1320 Sedative, hypnotic or anxiolytic dependence, uncomplicated: Secondary | ICD-10-CM | POA: Diagnosis not present

## 2018-12-28 DIAGNOSIS — F1320 Sedative, hypnotic or anxiolytic dependence, uncomplicated: Secondary | ICD-10-CM | POA: Diagnosis not present

## 2018-12-28 DIAGNOSIS — F10239 Alcohol dependence with withdrawal, unspecified: Secondary | ICD-10-CM | POA: Diagnosis not present

## 2018-12-28 DIAGNOSIS — F13229 Sedative, hypnotic or anxiolytic dependence with intoxication, unspecified: Secondary | ICD-10-CM | POA: Diagnosis not present

## 2018-12-28 DIAGNOSIS — F1020 Alcohol dependence, uncomplicated: Secondary | ICD-10-CM | POA: Diagnosis not present

## 2018-12-29 DIAGNOSIS — F1320 Sedative, hypnotic or anxiolytic dependence, uncomplicated: Secondary | ICD-10-CM | POA: Diagnosis not present

## 2018-12-29 DIAGNOSIS — F10239 Alcohol dependence with withdrawal, unspecified: Secondary | ICD-10-CM | POA: Diagnosis not present

## 2018-12-29 DIAGNOSIS — F1020 Alcohol dependence, uncomplicated: Secondary | ICD-10-CM | POA: Diagnosis not present

## 2018-12-29 DIAGNOSIS — F13229 Sedative, hypnotic or anxiolytic dependence with intoxication, unspecified: Secondary | ICD-10-CM | POA: Diagnosis not present

## 2018-12-30 DIAGNOSIS — F10239 Alcohol dependence with withdrawal, unspecified: Secondary | ICD-10-CM | POA: Diagnosis not present

## 2018-12-30 DIAGNOSIS — F1020 Alcohol dependence, uncomplicated: Secondary | ICD-10-CM | POA: Diagnosis not present

## 2018-12-30 DIAGNOSIS — F13229 Sedative, hypnotic or anxiolytic dependence with intoxication, unspecified: Secondary | ICD-10-CM | POA: Diagnosis not present

## 2018-12-30 DIAGNOSIS — F1320 Sedative, hypnotic or anxiolytic dependence, uncomplicated: Secondary | ICD-10-CM | POA: Diagnosis not present

## 2018-12-31 DIAGNOSIS — F13229 Sedative, hypnotic or anxiolytic dependence with intoxication, unspecified: Secondary | ICD-10-CM | POA: Diagnosis not present

## 2018-12-31 DIAGNOSIS — F1320 Sedative, hypnotic or anxiolytic dependence, uncomplicated: Secondary | ICD-10-CM | POA: Diagnosis not present

## 2018-12-31 DIAGNOSIS — F10239 Alcohol dependence with withdrawal, unspecified: Secondary | ICD-10-CM | POA: Diagnosis not present

## 2018-12-31 DIAGNOSIS — F1020 Alcohol dependence, uncomplicated: Secondary | ICD-10-CM | POA: Diagnosis not present

## 2019-01-01 DIAGNOSIS — F10239 Alcohol dependence with withdrawal, unspecified: Secondary | ICD-10-CM | POA: Diagnosis not present

## 2019-01-01 DIAGNOSIS — F1020 Alcohol dependence, uncomplicated: Secondary | ICD-10-CM | POA: Diagnosis not present

## 2019-01-01 DIAGNOSIS — F1320 Sedative, hypnotic or anxiolytic dependence, uncomplicated: Secondary | ICD-10-CM | POA: Diagnosis not present

## 2019-01-01 DIAGNOSIS — F13229 Sedative, hypnotic or anxiolytic dependence with intoxication, unspecified: Secondary | ICD-10-CM | POA: Diagnosis not present

## 2019-01-02 DIAGNOSIS — F10239 Alcohol dependence with withdrawal, unspecified: Secondary | ICD-10-CM | POA: Diagnosis not present

## 2019-01-02 DIAGNOSIS — F13229 Sedative, hypnotic or anxiolytic dependence with intoxication, unspecified: Secondary | ICD-10-CM | POA: Diagnosis not present

## 2019-01-02 DIAGNOSIS — F1320 Sedative, hypnotic or anxiolytic dependence, uncomplicated: Secondary | ICD-10-CM | POA: Diagnosis not present

## 2019-01-02 DIAGNOSIS — F1020 Alcohol dependence, uncomplicated: Secondary | ICD-10-CM | POA: Diagnosis not present

## 2019-01-03 DIAGNOSIS — F1020 Alcohol dependence, uncomplicated: Secondary | ICD-10-CM | POA: Diagnosis not present

## 2019-01-03 DIAGNOSIS — F1320 Sedative, hypnotic or anxiolytic dependence, uncomplicated: Secondary | ICD-10-CM | POA: Diagnosis not present

## 2019-01-03 DIAGNOSIS — F13229 Sedative, hypnotic or anxiolytic dependence with intoxication, unspecified: Secondary | ICD-10-CM | POA: Diagnosis not present

## 2019-01-03 DIAGNOSIS — F10239 Alcohol dependence with withdrawal, unspecified: Secondary | ICD-10-CM | POA: Diagnosis not present

## 2019-01-04 DIAGNOSIS — F10239 Alcohol dependence with withdrawal, unspecified: Secondary | ICD-10-CM | POA: Diagnosis not present

## 2019-01-04 DIAGNOSIS — F13229 Sedative, hypnotic or anxiolytic dependence with intoxication, unspecified: Secondary | ICD-10-CM | POA: Diagnosis not present

## 2019-01-04 DIAGNOSIS — F1320 Sedative, hypnotic or anxiolytic dependence, uncomplicated: Secondary | ICD-10-CM | POA: Diagnosis not present

## 2019-01-04 DIAGNOSIS — F1020 Alcohol dependence, uncomplicated: Secondary | ICD-10-CM | POA: Diagnosis not present

## 2019-01-05 ENCOUNTER — Other Ambulatory Visit: Payer: Self-pay

## 2019-01-05 ENCOUNTER — Ambulatory Visit (HOSPITAL_COMMUNITY): Payer: Medicare HMO | Admitting: Psychiatry

## 2019-01-05 DIAGNOSIS — F1020 Alcohol dependence, uncomplicated: Secondary | ICD-10-CM | POA: Diagnosis not present

## 2019-01-05 DIAGNOSIS — F10239 Alcohol dependence with withdrawal, unspecified: Secondary | ICD-10-CM | POA: Diagnosis not present

## 2019-01-05 DIAGNOSIS — F1320 Sedative, hypnotic or anxiolytic dependence, uncomplicated: Secondary | ICD-10-CM | POA: Diagnosis not present

## 2019-01-05 DIAGNOSIS — F13229 Sedative, hypnotic or anxiolytic dependence with intoxication, unspecified: Secondary | ICD-10-CM | POA: Diagnosis not present

## 2019-01-06 DIAGNOSIS — F10239 Alcohol dependence with withdrawal, unspecified: Secondary | ICD-10-CM | POA: Diagnosis not present

## 2019-01-06 DIAGNOSIS — F13229 Sedative, hypnotic or anxiolytic dependence with intoxication, unspecified: Secondary | ICD-10-CM | POA: Diagnosis not present

## 2019-01-06 DIAGNOSIS — F1020 Alcohol dependence, uncomplicated: Secondary | ICD-10-CM | POA: Diagnosis not present

## 2019-01-06 DIAGNOSIS — F1320 Sedative, hypnotic or anxiolytic dependence, uncomplicated: Secondary | ICD-10-CM | POA: Diagnosis not present

## 2019-01-07 DIAGNOSIS — F13229 Sedative, hypnotic or anxiolytic dependence with intoxication, unspecified: Secondary | ICD-10-CM | POA: Diagnosis not present

## 2019-01-07 DIAGNOSIS — F1020 Alcohol dependence, uncomplicated: Secondary | ICD-10-CM | POA: Diagnosis not present

## 2019-01-07 DIAGNOSIS — F10239 Alcohol dependence with withdrawal, unspecified: Secondary | ICD-10-CM | POA: Diagnosis not present

## 2019-01-07 DIAGNOSIS — F1320 Sedative, hypnotic or anxiolytic dependence, uncomplicated: Secondary | ICD-10-CM | POA: Diagnosis not present

## 2019-01-08 DIAGNOSIS — F1020 Alcohol dependence, uncomplicated: Secondary | ICD-10-CM | POA: Diagnosis not present

## 2019-01-08 DIAGNOSIS — F10239 Alcohol dependence with withdrawal, unspecified: Secondary | ICD-10-CM | POA: Diagnosis not present

## 2019-01-08 DIAGNOSIS — F1320 Sedative, hypnotic or anxiolytic dependence, uncomplicated: Secondary | ICD-10-CM | POA: Diagnosis not present

## 2019-01-08 DIAGNOSIS — F13229 Sedative, hypnotic or anxiolytic dependence with intoxication, unspecified: Secondary | ICD-10-CM | POA: Diagnosis not present

## 2019-01-09 DIAGNOSIS — F10239 Alcohol dependence with withdrawal, unspecified: Secondary | ICD-10-CM | POA: Diagnosis not present

## 2019-01-09 DIAGNOSIS — F13229 Sedative, hypnotic or anxiolytic dependence with intoxication, unspecified: Secondary | ICD-10-CM | POA: Diagnosis not present

## 2019-01-09 DIAGNOSIS — F1020 Alcohol dependence, uncomplicated: Secondary | ICD-10-CM | POA: Diagnosis not present

## 2019-01-09 DIAGNOSIS — F1320 Sedative, hypnotic or anxiolytic dependence, uncomplicated: Secondary | ICD-10-CM | POA: Diagnosis not present

## 2019-01-10 DIAGNOSIS — F1020 Alcohol dependence, uncomplicated: Secondary | ICD-10-CM | POA: Diagnosis not present

## 2019-01-10 DIAGNOSIS — F1320 Sedative, hypnotic or anxiolytic dependence, uncomplicated: Secondary | ICD-10-CM | POA: Diagnosis not present

## 2019-01-10 DIAGNOSIS — F13229 Sedative, hypnotic or anxiolytic dependence with intoxication, unspecified: Secondary | ICD-10-CM | POA: Diagnosis not present

## 2019-01-10 DIAGNOSIS — F10239 Alcohol dependence with withdrawal, unspecified: Secondary | ICD-10-CM | POA: Diagnosis not present

## 2019-01-11 DIAGNOSIS — F10239 Alcohol dependence with withdrawal, unspecified: Secondary | ICD-10-CM | POA: Diagnosis not present

## 2019-01-11 DIAGNOSIS — F1020 Alcohol dependence, uncomplicated: Secondary | ICD-10-CM | POA: Diagnosis not present

## 2019-01-11 DIAGNOSIS — F13229 Sedative, hypnotic or anxiolytic dependence with intoxication, unspecified: Secondary | ICD-10-CM | POA: Diagnosis not present

## 2019-01-11 DIAGNOSIS — F1320 Sedative, hypnotic or anxiolytic dependence, uncomplicated: Secondary | ICD-10-CM | POA: Diagnosis not present

## 2019-01-19 ENCOUNTER — Other Ambulatory Visit: Payer: Self-pay

## 2019-01-19 ENCOUNTER — Ambulatory Visit (INDEPENDENT_AMBULATORY_CARE_PROVIDER_SITE_OTHER): Payer: Medicare HMO | Admitting: Family Medicine

## 2019-01-19 ENCOUNTER — Encounter: Payer: Self-pay | Admitting: Family Medicine

## 2019-01-19 VITALS — BP 140/86 | HR 76 | Temp 98.7°F | Resp 12 | Ht 70.0 in | Wt 158.0 lb

## 2019-01-19 DIAGNOSIS — M25532 Pain in left wrist: Secondary | ICD-10-CM

## 2019-01-19 DIAGNOSIS — M79672 Pain in left foot: Secondary | ICD-10-CM | POA: Diagnosis not present

## 2019-01-19 DIAGNOSIS — I10 Essential (primary) hypertension: Secondary | ICD-10-CM

## 2019-01-19 NOTE — Patient Instructions (Addendum)
    Thank you for coming into the office today. I appreciate the opportunity to provide you with the care for your health and wellness. Today we discussed: Follow-up post treatment for alcohol detox.  Follow-up in 1 month with Dr. Moshe Cipro in the office.  Referral for orthopedics placed for your wrist and foot.  Congratulations on your detoxing.  Me and Dr. Moshe Cipro strongly encourage you to continue to refrain from drinking alcohol.  Over the next month please work to get yourself a sponsor. Over the next month please work to find a meeting that you are able to attend on a regular basis.  Over the next month please work to start doing some form of exercise on a regular basis such as walking for 30 minutes to an hour a day.  [place] YOUR HANDS WELL AND FREQUENTLY. AVOID TOUCHING YOUR FACE, UNLESS YOUR HANDS ARE FRESHLY WASHED.  GET FRESH AIR DAILY. STAY HYDRATED WITH WATER.   It was a pleasure to see you and I look forward to continuing to work together on your health and well-being. Please do not hesitate to call the office if you need care or have questions about your care.  Have a wonderful day and week. With Gratitude, Cherly Beach, DNP, AGNP-BC

## 2019-01-19 NOTE — Progress Notes (Signed)
Subjective:     Patient ID: Marc [name], male   DOB: 1956-03-02, 63 y.o.   MRN: [id number]  Marc [name] presents for Transitions Of Care [name] is a 63 year old male patient of Dr. [facility] Dakin.  Who presents today secondary to having alcohol detox therapy and rehab in [facility] at [place] [facility] for alcohol.  Reports that he was in there for 18 days.  We do not have any of the information in care everywhere at this time.  We will be looking to get this for review.  Reports taking all his medications as directed.  Denies having any trouble getting these or taking them or any side effects.  Is not currently on anything such as Antabuse or other medications that are used during alcohol but he does not need these.  Ports that this was his third or fourth time detoxing.  Son provided intervention at this time.  Reports his last drink was Dec 23, 2018.  Alcohol of choice was beer.  He has replaced this with sodas, milk, water.  Working on joining and attending [employer] and or meetings for substance abuse.  Reports that the virus has made that hard during the last couple weeks since being home.  Additionally he reports that is being rained out when they are held outside.  Reports that he does not have a sponsor at this time.  He wants to make sure that he finds a sponsor that mixes well with him.  He reports he has several friends that are sponsors.  Discussed that having a friend be a sponsor is not as beneficial as having somebody who is outside of your circle of friends.  Overall he reports that he is feeling okay.  Wears his glasses while he is in office.  Was 30 minutes late for appointment secondary to being at the dentist did call and leave a message so.  Reports that he has occasional headaches in the morning and in the afternoon.  Unsure of if they are related to caffeine and or elevated BP as his blood pressure is on the higher end today.  Reports that he has  sleep disturbance he is always had had some insomnia.  Has a lot of PTSD and nightmares.  Reports the medicines that they started him on which includes Minipress and Seroquel have been helping.  Additionally he reports his previously fractured wrist and swollen ankle on the left side have been giving him trouble.  X-rays have been performed these were several weeks ago the injury with a fall had taken place.  Healing should be well underway.  But he reports he still having trouble.  Would like referral to orthopedics.  Denies having any fevers, chills, cough, shortness of breath, visual disturbances, dizziness, chest pain, leg swelling, palpitations.  Past Medical, Surgical, Social History, Allergies, and Medications have been Reviewed.    Past Medical History:  Diagnosis Date  . Allergy   . Anxiety   . Arthritis   . Back problem   . Depression   . GERD (gastroesophageal reflux disease)   . Hepatitis C 1979  . Hepatitis C reactive    LIVER BX 2008-CHRONIC ACTIVE HEPATITIS  . HTN (hypertension)   . Hx of adenomatous colonic polyps 2008   due for surveillance 2017  . Hypercholesterolemia   . IBS (irritable bowel syndrome)   . Knee pain   . Leukopenia 11/06/2015  . Normal cardiac stress test 05/25/2011   [facility]  . Normal echocardiogram 05/25/2011   EF 55%, mild TR  . Substance abuse ([place])    narcotic addiction  . Thrombocytopenia ([place]) 11/06/2015   Past Surgical History:  Procedure Laterality Date  . APPENDECTOMY    . CARPAL TUNNEL RELEASE     rt  . COLONOSCOPY  2008 [facility] ARS D100 V8 PHEN 12.5   2 SIMPLE ADENOMAS (< 1 CM)  . ESOPHAGOGASTRODUODENOSCOPY  04/26/2012   Dr. Oneida Alar: Non-erosive gastritis (inflammation) was found in the gastric antrum but no H.pylori; multiple biopsies (duodenal bx negative for Celiac)/ The mucosa of the esophagus appeared normal  . EUS  09/08/2012   Dr. Ardis Hughs: CBD dilated but no stones. Query secondary to Sphincter of Oddi  stenosis, ?dysfunction, but clinically without symptoms  . HARDWARE REMOVAL Right 02/12/2014   Procedure: HARDWARE REMOVAL;  Surgeon: Jolyn Nap, MD;  Location: [facility];  Service: Orthopedics;  Laterality: Right;  . KNEE ARTHROSCOPY    . Neurostimulator implant    . right ring finger    . spinal stenosis, had screws put in neck  04/11/2009   DR KRITZER  . TONSILLECTOMY    . ULNA OSTEOTOMY Right 02/12/2014   Procedure: RIGHT ULNAR SHORTENING AND OSTEOTOMY ;  Surgeon: Jolyn Nap, MD;  Location: [facility];  Service: Orthopedics;  Laterality: Right;  . ULNAR NERVE TRANSPOSITION    . UPPER GASTROINTESTINAL ENDOSCOPY  2008 SLF ABD PAIN WEIGHT LOSS d100 v8 phen 12.5   NL  . WRIST SURGERY Right jan 2015   Dr. Edmonia Lynch   Social History   Socioeconomic History  . Marital status: Divorced    Spouse name: Not on file  . Number of children: Not on file  . Years of education: Not on file  . Highest education level: Not on file  Occupational History  . Not on file  Social Needs  . Financial resource strain: Not on file  . Food insecurity    Worry: Not on file    Inability: Not on file  . Transportation needs    Medical: Not on file    Non-medical: Not on file  Tobacco Use  . Smoking status: Former Smoker    Packs/day: 1.50    Years: 8.00    Pack years: 12.00    Types: Cigarettes    Quit date: 08/13/1986    Years since quitting: 32.4  . Smokeless tobacco: Never Used  . Tobacco comment: quit 20 + yrs ago  Substance and Sexual Activity  . Alcohol use: No    Alcohol/week: 0.0 standard drinks  . Drug use: No  . Sexual activity: Never  Lifestyle  . Physical activity    Days per week: Not on file    Minutes per session: Not on file  . Stress: Not on file  Relationships  . Social Herbalist on phone: Not on file    Gets together: Not on file    Attends religious service: Not on file    Active member of club or organization:  Not on file    Attends meetings of clubs or organizations: Not on file    Relationship status: Not on file  . Intimate partner violence    Fear of current or ex partner: Not on file    Emotionally abused: Not on file    Physically abused: Not on file    Forced sexual activity: Not on file  Other Topics Concern  .  Not on file  Social History Narrative  . Not on file    Outpatient Encounter Medications as of 01/19/2019  Medication Sig  . acetaminophen (TYLENOL) 500 MG tablet Take 500 mg by mouth as needed.  . Ascorbic Acid (VITAMIN C PO) Take 1 Dose by mouth daily.  Marland Kitchen aspirin EC 81 MG tablet Take 81 mg by mouth daily.  Marland Kitchen azelastine (ASTELIN) 0.1 % nasal spray Place 2 sprays into both nostrils 2 (two) times daily. Use in each nostril as directed (Patient taking differently: Place 2 sprays into both nostrils 2 (two) times daily as needed for allergies. Use in each nostril as directed)  . Cholecalciferol (VITAMIN D3 PO) Take 1 Dose by mouth daily.  . cloNIDine (CATAPRES) 0.2 MG tablet TAKE (1) TABLET BY MOUTH TWICE DAILY.  . mirtazapine (REMERON) 7.5 MG tablet Take 7.5 mg by mouth at bedtime.  . Multiple Vitamins-Minerals (MULTIVITAMIN ADULTS 50+) TABS Take 1 tablet by mouth daily.  . naproxen (NAPROSYN) 500 MG tablet Take 1 tablet (500 mg total) by mouth 2 (two) times daily with a meal.  . olopatadine (PATANOL) 0.1 % ophthalmic solution Place 1 drop into both eyes 2 (two) times daily.  . Omega-3 Fatty Acids (FISH OIL) 1000 MG CAPS Take 1 capsule by mouth daily.  . ondansetron (ZOFRAN) 4 MG tablet One tablet once daily as needed, for uncontrolled nausea  . prazosin (MINIPRESS) 2 MG capsule Take 2 mg by mouth at bedtime.  Marland Kitchen QUEtiapine Fumarate (SEROQUEL XR) 150 MG 24 hr tablet Take 150 mg by mouth daily.  Marland Kitchen senna (SENOKOT) 8.6 MG TABS tablet Take 1 tablet by mouth daily as needed for mild constipation.  . traMADol (ULTRAM) 50 MG tablet Take 1 tablet (50 mg total) by mouth every 6 (six) hours  as needed.  . [DISCONTINUED] clonazePAM (KLONOPIN) 0.5 MG tablet TAKE 1 TABLET BY MOUTH 3 TIMES A DAY AS NEEDED FOR ANXIETY. (Patient not taking: Reported on 01/19/2019)  . [DISCONTINUED] FLUoxetine (PROZAC) 20 MG capsule Take two in the morning and one in the afternoon (Patient not taking: Reported on 01/19/2019)  . [DISCONTINUED] gabapentin (NEURONTIN) 400 MG capsule Take 1 capsule (400 mg total) by mouth 3 (three) times daily. (Patient not taking: Reported on 01/19/2019)  . [DISCONTINUED] ibuprofen (ADVIL,MOTRIN) 600 MG tablet Take 1 tablet (600 mg total) by mouth every 8 (eight) hours as needed. (Patient not taking: Reported on 01/19/2019)  . [DISCONTINUED] Lactobacillus (DIGESTIVE HEALTH PROBIOTIC PO) Take 1 capsule by mouth as needed.   . [DISCONTINUED] milk thistle 175 MG tablet Take 175 mg by mouth daily.  . [DISCONTINUED] omeprazole (PRILOSEC) 20 MG capsule Take 20 mg by mouth daily.  . [DISCONTINUED] QUEtiapine (SEROQUEL) 50 MG tablet Take 1 tablet (50 mg total) by mouth 4 (four) times daily. (Patient not taking: Reported on 01/19/2019)   No facility-administered encounter medications on file as of 01/19/2019.    Allergies  Allergen Reactions  . Levofloxacin Nausea Only and Other (See Comments)    "Creepy" feeling, skin didn't feel right, sort of itchy.  . Ciprofloxacin     Other reaction(s): Sweating (intolerance)  . Sulfa Antibiotics     Review of Systems  Constitutional: Negative for chills and fever.  HENT: Negative.   Eyes: Negative for visual disturbance.  Respiratory: Negative for cough and shortness of breath.   Cardiovascular: Negative.   Gastrointestinal: Negative.   Endocrine: Negative.   Genitourinary: Negative.   Musculoskeletal:       SEE HPI  Skin: Negative.   Allergic/Immunologic: Negative.   Neurological: Positive for headaches. Negative for dizziness.       HA in the morning and mid day  Hematological: Negative.   Psychiatric/Behavioral: Positive for sleep  disturbance.  All other systems reviewed and are negative.      Objective:     BP 140/86   Pulse 76   Temp 98.7 F (37.1 C) (Oral)   Resp 12   Ht 5\' 10"  (1.778 m)   Wt 158 lb (71.7 kg)   SpO2 99%   BMI 22.67 kg/m   Physical Exam Vitals signs and nursing note reviewed.  Constitutional:      Appearance: Normal appearance. He is normal weight.  HENT:     Head: Normocephalic and atraumatic.     Right Ear: External ear normal.     Left Ear: External ear normal.     Nose: Nose normal.  Eyes:     General:        Right eye: No discharge.        Left eye: No discharge.     Conjunctiva/sclera: Conjunctivae normal.  Neck:     Musculoskeletal: Normal range of motion and neck supple.  Cardiovascular:     Rate and Rhythm: Normal rate and regular rhythm.     Pulses: Normal pulses.  Pulmonary:     Effort: Pulmonary effort is normal.     Breath sounds: Normal breath sounds.  Abdominal:     General: Bowel sounds are normal.  Musculoskeletal: Normal range of motion.     Left wrist: He exhibits tenderness. He exhibits normal range of motion.     Left ankle: He exhibits normal range of motion. Tenderness.     Comments: In a wrist splint  Non specific tenderness  Skin:    General: Skin is warm.  Neurological:     Mental Status: He is alert and oriented to person, place, and time.     Sensory: Sensation is intact.     Motor: Motor function is intact.     Coordination: Coordination is intact.     Gait: Gait is intact.  Psychiatric:        Mood and Affect: Mood normal.        Behavior: Behavior normal. Behavior is cooperative.        Thought Content: Thought content normal.        Judgment: Judgment normal.        Assessment and Plan       1. Essential hypertension Within the controlled range, believe that the control could be better.  Possibly related to substance, alcohol abuse.  And maybe being off of his medications while he was going through detox.  Reported that he  just recently started the clonidine back.  Will be bringing back in in 1 month for reassessment.  2. Acute pain of left wrist Previous injury feels that it does not heal properly still having trouble with picking up things and has been starting to drop things.  No noted weakness today some tenderness full range of motion though. Continues to wear splint provided when he got the x-ray.  Will be referring to orthopedics for further additional work-up if there is anything that can be done.  - AMB referral to orthopedics  3. Acute pain of left foot There is nonspecific swelling on x-ray previously done.  Reports that he is having trouble with walking on it.  Will be referring to orthopedics for further additional work-up  if there is anything that can be done.  - AMB referral to orthopedics   Follow Up: 1 month with Dr Moshe Cipro       Perlie Mayo, DNP, AGNP-BC [place], [place] [address] Office Hours: Mon-Thurs 8 am-5 pm; Fri 8 am-12 pm Office Phone:  [phone]  Office Fax: [phone]

## 2019-01-31 ENCOUNTER — Ambulatory Visit (INDEPENDENT_AMBULATORY_CARE_PROVIDER_SITE_OTHER): Payer: Medicare HMO

## 2019-01-31 ENCOUNTER — Ambulatory Visit: Payer: Medicare HMO | Admitting: Orthopaedic Surgery

## 2019-01-31 ENCOUNTER — Encounter: Payer: Self-pay | Admitting: Orthopaedic Surgery

## 2019-01-31 ENCOUNTER — Ambulatory Visit: Payer: Medicare HMO

## 2019-01-31 ENCOUNTER — Other Ambulatory Visit: Payer: Self-pay

## 2019-01-31 VITALS — BP 124/72 | HR 75 | Temp 98.1°F | Ht 70.0 in | Wt 164.0 lb

## 2019-01-31 DIAGNOSIS — G8929 Other chronic pain: Secondary | ICD-10-CM

## 2019-01-31 DIAGNOSIS — S92342A Displaced fracture of fourth metatarsal bone, left foot, initial encounter for closed fracture: Secondary | ICD-10-CM

## 2019-01-31 DIAGNOSIS — M25532 Pain in left wrist: Secondary | ICD-10-CM

## 2019-01-31 DIAGNOSIS — M25572 Pain in left ankle and joints of left foot: Secondary | ICD-10-CM | POA: Diagnosis not present

## 2019-01-31 NOTE — Progress Notes (Signed)
Subjective:    Patient ID: Marc [name], male    DOB: September 15, 1955, 63 y.o.   MRN: [id number]  HPI He had a fall walking his dog about a month ago.  He was seen in the ER on 12-20-2018.  X-rays were done of the left wrist and hand and the left foot.  The left wrist had possible fracture of the ulnar styloid.  The left foot was negative.  He was given a splint for the wrist.  It is better but the foot is bothering him more.  He has no new trauma, no redness.   Review of Systems  Constitutional: Positive for activity change.  Musculoskeletal: Positive for arthralgias, gait problem and joint swelling.  All other systems reviewed and are negative.  For Review of Systems, all other systems reviewed and are negative.  The following is a summary of the past history medically, past history surgically, known current medicines, social history and family history.  This information is gathered electronically by the computer from prior information and documentation.  I review this each visit and have found including this information at this point in the chart is beneficial and informative.   Past Medical History:  Diagnosis Date  . Allergy   . Anxiety   . Arthritis   . Back problem   . Depression   . GERD (gastroesophageal reflux disease)   . Hepatitis C 1979  . Hepatitis C reactive    LIVER BX 2008-CHRONIC ACTIVE HEPATITIS  . HTN (hypertension)   . Hx of adenomatous colonic polyps 2008   due for surveillance 2017  . Hypercholesterolemia   . IBS (irritable bowel syndrome)   . Knee pain   . Leukopenia 11/06/2015  . Normal cardiac stress test 05/25/2011   [facility]  . Normal echocardiogram 05/25/2011   EF 55%, mild TR  . Substance abuse ([place])    narcotic addiction  . Thrombocytopenia ([place]) 11/06/2015    Past Surgical History:  Procedure Laterality Date  . APPENDECTOMY    . CARPAL TUNNEL RELEASE     rt  . COLONOSCOPY  2008 [facility] ARS D100 V8 PHEN 12.5   2 SIMPLE  ADENOMAS (< 1 CM)  . ESOPHAGOGASTRODUODENOSCOPY  04/26/2012   Dr. Oneida Alar: Non-erosive gastritis (inflammation) was found in the gastric antrum but no H.pylori; multiple biopsies (duodenal bx negative for Celiac)/ The mucosa of the esophagus appeared normal  . EUS  09/08/2012   Dr. Ardis Hughs: CBD dilated but no stones. Query secondary to Sphincter of Oddi stenosis, ?dysfunction, but clinically without symptoms  . HARDWARE REMOVAL Right 02/12/2014   Procedure: HARDWARE REMOVAL;  Surgeon: Jolyn Nap, MD;  Location: [facility];  Service: Orthopedics;  Laterality: Right;  . KNEE ARTHROSCOPY    . Neurostimulator implant    . right ring finger    . spinal stenosis, had screws put in neck  04/11/2009   DR KRITZER  . TONSILLECTOMY    . ULNA OSTEOTOMY Right 02/12/2014   Procedure: RIGHT ULNAR SHORTENING AND OSTEOTOMY ;  Surgeon: Jolyn Nap, MD;  Location: [facility];  Service: Orthopedics;  Laterality: Right;  . ULNAR NERVE TRANSPOSITION    . UPPER GASTROINTESTINAL ENDOSCOPY  2008 SLF ABD PAIN WEIGHT LOSS d100 v8 phen 12.5   NL  . WRIST SURGERY Right jan 2015   Dr. Edmonia Lynch    Current Outpatient Medications on File Prior to Visit  Medication Sig Dispense Refill  . acetaminophen (TYLENOL) 500  MG tablet Take 500 mg by mouth as needed.    . Ascorbic Acid (VITAMIN C PO) Take 1 Dose by mouth daily.    Marland Kitchen aspirin EC 81 MG tablet Take 81 mg by mouth daily.    Marland Kitchen azelastine (ASTELIN) 0.1 % nasal spray Place 2 sprays into both nostrils 2 (two) times daily. Use in each nostril as directed (Patient taking differently: Place 2 sprays into both nostrils 2 (two) times daily as needed for allergies. Use in each nostril as directed) 30 mL 4  . Cholecalciferol (VITAMIN D3 PO) Take 1 Dose by mouth daily.    . cloNIDine (CATAPRES) 0.2 MG tablet TAKE (1) TABLET BY MOUTH TWICE DAILY. 60 tablet 5  . mirtazapine (REMERON) 7.5 MG tablet Take 7.5 mg by mouth at bedtime.    . Multiple  Vitamins-Minerals (MULTIVITAMIN ADULTS 50+) TABS Take 1 tablet by mouth daily.    . naproxen (NAPROSYN) 500 MG tablet Take 1 tablet (500 mg total) by mouth 2 (two) times daily with a meal. 30 tablet 0  . olopatadine (PATANOL) 0.1 % ophthalmic solution Place 1 drop into both eyes 2 (two) times daily. 5 mL 2  . Omega-3 Fatty Acids (FISH OIL) 1000 MG CAPS Take 1 capsule by mouth daily.    . ondansetron (ZOFRAN) 4 MG tablet One tablet once daily as needed, for uncontrolled nausea 7 tablet 0  . prazosin (MINIPRESS) 2 MG capsule Take 2 mg by mouth at bedtime.    Marland Kitchen QUEtiapine Fumarate (SEROQUEL XR) 150 MG 24 hr tablet Take 150 mg by mouth daily.    Marland Kitchen senna (SENOKOT) 8.6 MG TABS tablet Take 1 tablet by mouth daily as needed for mild constipation.    . traMADol (ULTRAM) 50 MG tablet Take 1 tablet (50 mg total) by mouth every 6 (six) hours as needed. 12 tablet 0   No current facility-administered medications on file prior to visit.     Social History   Socioeconomic History  . Marital status: Divorced    Spouse name: Not on file  . Number of children: Not on file  . Years of education: Not on file  . Highest education level: Not on file  Occupational History  . Not on file  Social Needs  . Financial resource strain: Not on file  . Food insecurity    Worry: Not on file    Inability: Not on file  . Transportation needs    Medical: Not on file    Non-medical: Not on file  Tobacco Use  . Smoking status: Former Smoker    Packs/day: 1.50    Years: 8.00    Pack years: 12.00    Types: Cigarettes    Quit date: 08/13/1986    Years since quitting: 32.4  . Smokeless tobacco: Never Used  . Tobacco comment: quit 20 + yrs ago  Substance and Sexual Activity  . Alcohol use: No    Alcohol/week: 0.0 standard drinks  . Drug use: No  . Sexual activity: Never  Lifestyle  . Physical activity    Days per week: Not on file    Minutes per session: Not on file  . Stress: Not on file  Relationships  .  Social Herbalist on phone: Not on file    Gets together: Not on file    Attends religious service: Not on file    Active member of club or organization: Not on file    Attends meetings of clubs or organizations:  Not on file    Relationship status: Not on file  . Intimate partner violence    Fear of current or ex partner: Not on file    Emotionally abused: Not on file    Physically abused: Not on file    Forced sexual activity: Not on file  Other Topics Concern  . Not on file  Social History Narrative  . Not on file    Family History  Problem Relation Age of Onset  . Bladder Cancer Mother        rare form   . Cancer Sister        Metastatic abdominal  . Emotional abuse Sister   . Stroke Sister   . Emotional abuse Sister   . Heart failure Father   . Stroke Father   . Alcohol abuse Father   . Dementia Paternal Aunt   . Alcohol abuse Paternal Uncle   . Dementia Paternal Uncle   . Dementia Paternal Grandmother   . Stroke Paternal Grandmother   . ADD / ADHD Cousin   . Bipolar disorder Cousin   . Anxiety disorder Cousin   . Depression Cousin        Committed suicide  . Alcohol abuse Cousin   . OCD Cousin   . Paranoid behavior Cousin   . Brain cancer Maternal Grandfather   . Heart defect Other        FAMILY HX  . Colon cancer Maternal Uncle   . Colon polyps Neg Hx   . Drug abuse Neg Hx   . Schizophrenia Neg Hx   . Seizures Neg Hx   . Sexual abuse Neg Hx   . Physical abuse Neg Hx     BP 124/72   Pulse 75   Temp 98.1 F (36.7 C)   Ht 5\' 10"  (1.778 m)   Wt 164 lb (74.4 kg)   BMI 23.53 kg/m   Body mass index is 23.53 kg/m.     Objective:   Physical Exam Vitals signs reviewed.  Constitutional:      Appearance: He is well-developed.  HENT:     Head: Normocephalic and atraumatic.  Eyes:     Conjunctiva/sclera: Conjunctivae normal.     Pupils: Pupils are equal, round, and reactive to light.  Neck:     Musculoskeletal: Normal range of motion  and neck supple.  Cardiovascular:     Rate and Rhythm: Normal rate and regular rhythm.  Pulmonary:     Effort: Pulmonary effort is normal.  Abdominal:     Palpations: Abdomen is soft.  Musculoskeletal:     Left wrist: He exhibits tenderness.       Hands:       Feet:  Skin:    General: Skin is warm and dry.  Neurological:     Mental Status: He is alert and oriented to person, place, and time.     Cranial Nerves: No cranial nerve deficit.     Motor: No abnormal muscle tone.     Coordination: Coordination normal.     Deep Tendon Reflexes: Reflexes are normal and symmetric. Reflexes normal.  Psychiatric:        Behavior: Behavior normal.        Thought Content: Thought content normal.        Judgment: Judgment normal.   x-rays were done of the left wrist, left foot, reported separately.        Assessment & Plan:   Encounter Diagnoses  Name Primary?  Marland Kitchen  Pain of joint of left ankle and foot   . Wrist pain, chronic, left   . Closed fracture of fourth metatarsal bone of left foot, initial encounter Yes   I will have OT see him for the wrist pain.  I have explained the findings of the fracture of the foot.  Return in three weeks.  Call if any problem.  Precautions discussed.   Electronically Signed Sanjuana Kava, MD 6/23/202011:43 AM

## 2019-02-08 ENCOUNTER — Ambulatory Visit (INDEPENDENT_AMBULATORY_CARE_PROVIDER_SITE_OTHER): Payer: Medicare HMO | Admitting: Family Medicine

## 2019-02-08 ENCOUNTER — Encounter: Payer: Self-pay | Admitting: Family Medicine

## 2019-02-08 ENCOUNTER — Other Ambulatory Visit: Payer: Self-pay

## 2019-02-08 VITALS — BP 124/72 | HR 75 | Resp 12 | Ht 70.0 in | Wt 158.0 lb

## 2019-02-08 DIAGNOSIS — F339 Major depressive disorder, recurrent, unspecified: Secondary | ICD-10-CM

## 2019-02-08 DIAGNOSIS — Z0001 Encounter for general adult medical examination with abnormal findings: Secondary | ICD-10-CM | POA: Diagnosis not present

## 2019-02-08 DIAGNOSIS — Z0000 Encounter for general adult medical examination without abnormal findings: Secondary | ICD-10-CM

## 2019-02-08 DIAGNOSIS — F4310 Post-traumatic stress disorder, unspecified: Secondary | ICD-10-CM | POA: Diagnosis not present

## 2019-02-08 MED ORDER — PRAZOSIN HCL 5 MG PO CAPS: 5.0000 mg | ORAL_CAPSULE | Freq: Every day | ORAL | 1 refills | Status: DC

## 2019-02-08 MED ORDER — MIRTAZAPINE 7.5 MG PO TABS: 7.5000 mg | ORAL_TABLET | Freq: Every day | ORAL | 1 refills | Status: DC

## 2019-02-08 NOTE — Patient Instructions (Addendum)
Marc [name] , Thank you for taking time to come for your Medicare Wellness Visit. I appreciate your ongoing commitment to your health goals. Please review the following plan we discussed and let me know if I can assist you in the future.   Please continue to practice social distancing to keep you, your family, and our community safe.  If you must go out, please wear a Mask and practice good handwashing.  Screening recommendations/referrals: Colonoscopy: Check for cologuard at home Recommended yearly ophthalmology/optometry visit for glaucoma screening and checkup Recommended yearly dental visit for hygiene and checkup  Vaccinations: Influenza vaccine: Due Fall 2020 Pneumococcal vaccine: Has had 59; Needs 23 Tdap vaccine:  Due 2023 Shingles vaccine: Zoster completed    Advanced directives: Mail out copies  Conditions/risks identified: Fall risk  Next appointment: 02/22/2019   Preventive Care 65-64 Years, Male Preventive care refers to lifestyle choices and visits with your health care provider that can promote health and wellness. What does preventive care include?  A yearly physical exam. This is also called an annual well check.  Dental exams once or twice a year.  Routine eye exams. Ask your health care provider how often you should have your eyes checked.  Personal lifestyle choices, including:  Daily care of your teeth and gums.  Regular physical activity.  Eating a healthy diet.  Avoiding tobacco and drug use.  Limiting alcohol use.  Practicing safe sex.  Taking low-dose aspirin every day starting at age 72. What happens during an annual well check? The services and screenings done by your health care provider during your annual well check will depend on your age, overall health, lifestyle risk factors, and family history of disease. Counseling  Your health care provider may ask you questions about your:  Alcohol use.  Tobacco use.  Drug use.  Emotional  well-being.  Home and relationship well-being.  Sexual activity.  Eating habits.  Work and work Statistician. Screening  You may have the following tests or measurements:  Height, weight, and BMI.  Blood pressure.  Lipid and cholesterol levels. These may be checked every 5 years, or more frequently if you are over 67 years old.  Skin check.  Lung cancer screening. You may have this screening every year starting at age 67 if you have a 30-pack-year history of smoking and currently smoke or have quit within the past 15 years.  Fecal occult blood test (FOBT) of the stool. You may have this test every year starting at age 64.  Flexible sigmoidoscopy or colonoscopy. You may have a sigmoidoscopy every 5 years or a colonoscopy every 10 years starting at age 66.  Prostate cancer screening. Recommendations will vary depending on your family history and other risks.  Hepatitis C blood test.  Hepatitis B blood test.  Sexually transmitted disease (STD) testing.  Diabetes screening. This is done by checking your blood sugar (glucose) after you have not eaten for a while (fasting). You may have this done every 1-3 years. Discuss your test results, treatment options, and if necessary, the need for more tests with your health care provider. Vaccines  Your health care provider may recommend certain vaccines, such as:  Influenza vaccine. This is recommended every year.  Tetanus, diphtheria, and acellular pertussis (Tdap, Td) vaccine. You may need a Td booster every 10 years.  Zoster vaccine. You may need this after age 45.  Pneumococcal 13-valent conjugate (PCV13) vaccine. You may need this if you have certain conditions and have not been vaccinated.  Pneumococcal polysaccharide (PPSV23) vaccine. You may need one or two doses if you smoke cigarettes or if you have certain conditions. Talk to your health care provider about which screenings and vaccines you need and how often you need  them. This information is not intended to replace advice given to you by your health care provider. Make sure you discuss any questions you have with your health care provider. Document Released: 08/23/2015 Document Revised: 04/15/2016 Document Reviewed: 05/28/2015 Elsevier Interactive Patient Education  2017 [employer] Prevention in the Home Falls can cause injuries. They can happen to people of all ages. There are many things you can do to make your home safe and to help prevent falls. What can I do on the outside of my home?  Regularly fix the edges of walkways and driveways and fix any cracks.  Remove anything that might make you trip as you walk through a door, such as a raised step or threshold.  Trim any bushes or trees on the path to your home.  Use bright outdoor lighting.  Clear any walking paths of anything that might make someone trip, such as rocks or tools.  Regularly check to see if handrails are loose or broken. Make sure that both sides of any steps have handrails.  Any raised decks and porches should have guardrails on the edges.  Have any leaves, snow, or ice cleared regularly.  Use sand or salt on walking paths during winter.  Clean up any spills in your garage right away. This includes oil or grease spills. What can I do in the bathroom?  Use night lights.  Install grab bars by the toilet and in the tub and shower. Do not use towel bars as grab bars.  Use non-skid mats or decals in the tub or shower.  If you need to sit down in the shower, use a plastic, non-slip stool.  Keep the floor dry. Clean up any water that spills on the floor as soon as it happens.  Remove soap buildup in the tub or shower regularly.  Attach bath mats securely with double-sided non-slip rug tape.  Do not have throw rugs and other things on the floor that can make you trip. What can I do in the bedroom?  Use night lights.  Make sure that you have a light by your  bed that is easy to reach.  Do not use any sheets or blankets that are too big for your bed. They should not hang down onto the floor.  Have a firm chair that has side arms. You can use this for support while you get dressed.  Do not have throw rugs and other things on the floor that can make you trip. What can I do in the kitchen?  Clean up any spills right away.  Avoid walking on wet floors.  Keep items that you use a lot in easy-to-reach places.  If you need to reach something above you, use a strong step stool that has a grab bar.  Keep electrical cords out of the way.  Do not use floor polish or wax that makes floors slippery. If you must use wax, use non-skid floor wax.  Do not have throw rugs and other things on the floor that can make you trip. What can I do with my stairs?  Do not leave any items on the stairs.  Make sure that there are handrails on both sides of the stairs and use them. Fix handrails that  are broken or loose. Make sure that handrails are as long as the stairways.  Check any carpeting to make sure that it is firmly attached to the stairs. Fix any carpet that is loose or worn.  Avoid having throw rugs at the top or bottom of the stairs. If you do have throw rugs, attach them to the floor with carpet tape.  Make sure that you have a light switch at the top of the stairs and the bottom of the stairs. If you do not have them, ask someone to add them for you. What else can I do to help prevent falls?  Wear shoes that:  Do not have high heels.  Have rubber bottoms.  Are comfortable and fit you well.  Are closed at the toe. Do not wear sandals.  If you use a stepladder:  Make sure that it is fully opened. Do not climb a closed stepladder.  Make sure that both sides of the stepladder are locked into place.  Ask someone to hold it for you, if possible.  Clearly mark and make sure that you can see:  Any grab bars or handrails.  First and last  steps.  Where the edge of each step is.  Use tools that help you move around (mobility aids) if they are needed. These include:  Canes.  Walkers.  Scooters.  Crutches.  Turn on the lights when you go into a dark area. Replace any light bulbs as soon as they burn out.  Set up your furniture so you have a clear path. Avoid moving your furniture around.  If any of your floors are uneven, fix them.  If there are any pets around you, be aware of where they are.  Review your medicines with your doctor. Some medicines can make you feel dizzy. This can increase your chance of falling. Ask your doctor what other things that you can do to help prevent falls. This information is not intended to replace advice given to you by your health care provider. Make sure you discuss any questions you have with your health care provider. Document Released: 05/23/2009 Document Revised: 01/02/2016 Document Reviewed: 08/31/2014 Elsevier Interactive Patient Education  2017 [employer].

## 2019-02-08 NOTE — Progress Notes (Signed)
Subjective:   Marc [name] is a 63 y.o. male who presents for Medicare Annual/Subsequent preventive examination.  Location of Patient: Home Location of Provider: Telehealth Consent was obtain for visit to be over via telehealth.  I verified that I am speaking with the correct person using two identifiers.   Review of Systems:    Cardiac Risk Factors include: male gender;sedentary lifestyle     Objective:    Vitals: BP 124/72   Pulse 75   Resp 12   Ht 5\' 10"  (1.778 m)   Wt 158 lb (71.7 kg)   BMI 22.67 kg/m   Body mass index is 22.67 kg/m.  Advanced Directives 12/20/2018 12/01/2018 05/05/2017 03/31/2017 10/01/2016 03/31/2016 11/06/2015  Does Patient Have a Medical Advance Directive? No No No No No No No  Would patient like information on creating a medical advance directive? - - Yes (MAU/Ambulatory/Procedural Areas - Information given) No - Patient declined No - Patient declined - No - patient declined information  Pre-existing out of facility DNR order (yellow form or pink MOST form) - - - - - - -    Tobacco Social History   Tobacco Use  Smoking Status Former Smoker  . Packs/day: 1.50  . Years: 8.00  . Pack years: 12.00  . Types: Cigarettes  . Quit date: 08/13/1986  . Years since quitting: 32.5  Smokeless Tobacco Never Used  Tobacco Comment   quit 20 + yrs ago     Counseling given: Yes Comment: quit 20 + yrs ago   Clinical Intake:  Pre-visit preparation completed: Yes  Pain : 0-10 Pain Score: 6  Pain Location: Back(leg, foot, hand) Pain Orientation: Lower Pain Descriptors / Indicators: Burning Pain Onset: More than a month ago Pain Frequency: Other (Comment)(comes and goes depending on what movement he makes) Pain Relieving Factors: nothing Effect of Pain on Daily Activities: yes  Pain Relieving Factors: nothing  BMI - recorded: 22.67 Nutritional Status: BMI of 19-24  Normal Nutritional Risks: None Diabetes: No  How often do you need to have someone  help you when you read instructions, pamphlets, or other written materials from your doctor or pharmacy?: 1 - Never What is the last grade level you completed in school?: 12+ some college  Interpreter Needed?: No     Past Medical History:  Diagnosis Date  . Allergy   . Anxiety   . Arthritis   . Back problem   . Depression   . GERD (gastroesophageal reflux disease)   . Hepatitis C 1979  . Hepatitis C reactive    LIVER BX 2008-CHRONIC ACTIVE HEPATITIS  . HTN (hypertension)   . Hx of adenomatous colonic polyps 2008   due for surveillance 2017  . Hypercholesterolemia   . IBS (irritable bowel syndrome)   . Knee pain   . Leukopenia 11/06/2015  . Normal cardiac stress test 05/25/2011   [facility] [place]  . Normal echocardiogram 05/25/2011   EF 55%, mild TR  . Substance abuse ([place])    narcotic addiction  . Thrombocytopenia ([place]) 11/06/2015   Past Surgical History:  Procedure Laterality Date  . APPENDECTOMY    . CARPAL TUNNEL RELEASE     rt  . COLONOSCOPY  2008 [facility] ARS D100 V8 PHEN 12.5   2 SIMPLE ADENOMAS (< 1 CM)  . ESOPHAGOGASTRODUODENOSCOPY  04/26/2012   Dr. Oneida Alar: Non-erosive gastritis (inflammation) was found in the gastric antrum but no H.pylori; multiple biopsies (duodenal bx negative for Celiac)/ The mucosa of the esophagus  appeared normal  . EUS  09/08/2012   Dr. Ardis Hughs: CBD dilated but no stones. Query secondary to Sphincter of Oddi stenosis, ?dysfunction, but clinically without symptoms  . HARDWARE REMOVAL Right 02/12/2014   Procedure: HARDWARE REMOVAL;  Surgeon: Jolyn Nap, MD;  Location: [facility];  Service: Orthopedics;  Laterality: Right;  . KNEE ARTHROSCOPY    . Neurostimulator implant    . right ring finger    . spinal stenosis, had screws put in neck  04/11/2009   DR KRITZER  . TONSILLECTOMY    . ULNA OSTEOTOMY Right 02/12/2014   Procedure: RIGHT ULNAR SHORTENING AND OSTEOTOMY ;  Surgeon: Jolyn Nap, MD;  Location: [facility];  Service: Orthopedics;  Laterality: Right;  . ULNAR NERVE TRANSPOSITION    . UPPER GASTROINTESTINAL ENDOSCOPY  2008 [facility] ABD PAIN WEIGHT LOSS d100 v8 phen 12.5   NL  . WRIST SURGERY Right jan 2015   Dr. Edmonia Lynch   Family History  Problem Relation Age of Onset  . Bladder Cancer Mother        rare form   . Cancer Sister        Metastatic abdominal  . Emotional abuse Sister   . Stroke Sister   . Emotional abuse Sister   . Heart failure Father   . Stroke Father   . Alcohol abuse Father   . Dementia Paternal Aunt   . Alcohol abuse Paternal Uncle   . Dementia Paternal Uncle   . Dementia Paternal Grandmother   . Stroke Paternal Grandmother   . ADD / ADHD Cousin   . Bipolar disorder Cousin   . Anxiety disorder Cousin   . Depression Cousin        Committed suicide  . Alcohol abuse Cousin   . OCD Cousin   . Paranoid behavior Cousin   . Brain cancer Maternal Grandfather   . Heart defect Other        FAMILY HX  . Colon cancer Maternal Uncle   . Colon polyps Neg Hx   . Drug abuse Neg Hx   . Schizophrenia Neg Hx   . Seizures Neg Hx   . Sexual abuse Neg Hx   . Physical abuse Neg Hx    Social History   Socioeconomic History  . Marital status: Divorced    Spouse name: Not on file  . Number of children: Not on file  . Years of education: Not on file  . Highest education level: Not on file  Occupational History  . Not on file  Social Needs  . Financial resource strain: Not hard at all  . Food insecurity    Worry: Never true    Inability: Never true  . Transportation needs    Medical: No    Non-medical: No  Tobacco Use  . Smoking status: Former Smoker    Packs/day: 1.50    Years: 8.00    Pack years: 12.00    Types: Cigarettes    Quit date: 08/13/1986    Years since quitting: 32.5  . Smokeless tobacco: Never Used  . Tobacco comment: quit 20 + yrs ago  Substance and Sexual Activity  . Alcohol use: No    Alcohol/week: 0.0 standard drinks  .  Drug use: No  . Sexual activity: Never  Lifestyle  . Physical activity    Days per week: 0 days    Minutes per session: 0 min  . Stress: Very much  Relationships  .  Social connections    Talks on phone: More than three times a week    Gets together: Three times a week    Attends religious service: Never    Active member of club or organization: No    Attends meetings of clubs or organizations: Never    Relationship status: Divorced  Other Topics Concern  . Not on file  Social History Narrative  . Not on file    Outpatient Encounter Medications as of 02/08/2019  Medication Sig  . acetaminophen (TYLENOL) 500 MG tablet Take 500 mg by mouth as needed.  . Ascorbic Acid (VITAMIN C PO) Take 1 Dose by mouth daily.  Marland Kitchen aspirin EC 81 MG tablet Take 81 mg by mouth daily.  . cloNIDine (CATAPRES) 0.2 MG tablet TAKE (1) TABLET BY MOUTH TWICE DAILY.  . mirtazapine (REMERON) 7.5 MG tablet Take 7.5 mg by mouth at bedtime.  . Multiple Vitamins-Minerals (MULTIVITAMIN ADULTS 50+) TABS Take 1 tablet by mouth daily.  Marland Kitchen olopatadine (PATANOL) 0.1 % ophthalmic solution Place 1 drop into both eyes 2 (two) times daily.  . ondansetron (ZOFRAN) 4 MG tablet One tablet once daily as needed, for uncontrolled nausea  . prazosin (MINIPRESS) 2 MG capsule Take 2 mg by mouth at bedtime.  Marland Kitchen QUEtiapine Fumarate (SEROQUEL XR) 150 MG 24 hr tablet Take 150 mg by mouth daily.  . Cholecalciferol (VITAMIN D3 PO) Take 1 Dose by mouth daily.  . naproxen (NAPROSYN) 500 MG tablet Take 1 tablet (500 mg total) by mouth 2 (two) times daily with a meal. (Patient not taking: Reported on 02/08/2019)  . Omega-3 Fatty Acids (FISH OIL) 1000 MG CAPS Take 1 capsule by mouth daily.  . [DISCONTINUED] azelastine (ASTELIN) 0.1 % nasal spray Place 2 sprays into both nostrils 2 (two) times daily. Use in each nostril as directed (Patient not taking: Reported on 02/08/2019)  . [DISCONTINUED] senna (SENOKOT) 8.6 MG TABS tablet Take 1 tablet by mouth  daily as needed for mild constipation.  . [DISCONTINUED] traMADol (ULTRAM) 50 MG tablet Take 1 tablet (50 mg total) by mouth every 6 (six) hours as needed. (Patient not taking: Reported on 02/08/2019)   No facility-administered encounter medications on file as of 02/08/2019.     Activities of Daily Living In your present state of health, do you have any difficulty performing the following activities: 02/08/2019  Hearing? N  Vision? N  Difficulty concentrating or making decisions? Y  Walking or climbing stairs? Y  Dressing or bathing? N  Doing errands, shopping? N  Preparing Food and eating ? Y  Using the Toilet? N  In the past six months, have you accidently leaked urine? N  Do you have problems with loss of bowel control? N  Managing your Medications? N  Managing your Finances? N  Housekeeping or managing your Housekeeping? N  Some recent data might be hidden    Patient Care Team: Perlie Mayo, NP as PCP - General (Family Medicine) Danie Binder, MD (Gastroenterology) Leta Baptist, MD as Consulting Physician (Otolaryngology) Drema Dallas, MD (Physical Medicine and Rehabilitation) Milly Jakob, MD as Consulting Physician (Orthopedic Surgery) Cloria Spring, MD as Consulting Physician ([place])   Assessment:   This is a routine wellness examination for Gwyndolyn Saxon.  Exercise Activities and Dietary recommendations Current Exercise Habits: The patient does not participate in regular exercise at present, Exercise limited by: None identified  Goals   None     Fall Risk Fall Risk  02/08/2019 01/19/2019 05/05/2017  Falls in the past year? 1 1 Yes  Number falls in past yr: 1 1 2  or more  Injury with Fall? 1 1 No  Follow up - - Falls evaluation completed;Education provided;Falls prevention discussed   Is the patient's home free of loose throw rugs in walkways, pet beds, electrical cords, etc?   yes      Grab bars in the bathroom? no      Handrails on the stairs?    yes      Adequate lighting?   yes     Depression Screen PHQ 2/9 Scores 02/08/2019 01/19/2019 03/15/2018 05/05/2017  PHQ - 2 Score 3 1 4 1   PHQ- 9 Score 19 - 11 5  Exception Documentation - - - -  Not completed - - - -    Cognitive Function     6CIT Screen 02/08/2019 05/05/2017  What Year? 0 points 0 points  What month? 0 points 0 points  What time? 0 points 0 points  Count back from 20 0 points 0 points  Months in reverse 0 points 0 points  Repeat phrase 0 points 0 points  Total Score 0 0    Immunization History  Administered Date(s) Administered  . Influenza Split 06/20/2012  . Influenza,inj,Quad PF,6+ Mos 04/24/2013, 08/28/2014, 06/13/2015, 08/11/2017  . Tdap 12/24/2011  . Zoster 10/04/2013    Qualifies for Shingles Vaccine?  Completed   Screening Tests Health Maintenance  Topic Date Due  . COLONOSCOPY  09/29/2003  . PNA vac Low Risk Adult (1 of 2 - PCV13) 09/28/2018  . INFLUENZA VACCINE  03/11/2019  . TETANUS/TDAP  12/23/2021  . Hepatitis C Screening  Completed  . HIV Screening  Completed   Cancer Screenings: Lung: Low Dose CT Chest recommended if Age 108-80 years, 30 pack-year currently smoking OR have quit w/in 15years. Patient does not qualify. Colorectal:    Additional Screenings:   Hepatitis C Screening: completed       Plan:      1. Encounter for Medicare annual wellness exam  I have personally reviewed and noted the following in the patient's chart:   . Medical and social history . Use of alcohol, tobacco or illicit drugs  . Current medications and supplements . Functional ability and status . Nutritional status . Physical activity . Advanced directives . List of other physicians . Hospitalizations, surgeries, and ER visits in previous 12 months . Vitals . Screenings to include cognitive, depression, and falls . Referrals and appointments  In addition, I have reviewed and discussed with patient certain preventive protocols, quality metrics, and  best practice recommendations. A written personalized care plan for preventive services as well as general preventive health recommendations were provided to patient.    2. Depression, recurrent (HCC)  - mirtazapine (REMERON) 7.5 MG tablet; Take 1 tablet (7.5 mg total) by mouth at bedtime.  Dispense: 30 tablet; Refill: 1  3. PTSD (post-traumatic stress disorder)  - prazosin (MINIPRESS) 5 MG capsule; Take 1 capsule (5 mg total) by mouth at bedtime.  Dispense: 30 capsule; Refill: 1  I provided 30 minutes of non-face-to-face time during this encounter.   Perlie Mayo, NP  02/08/2019

## 2019-02-13 DIAGNOSIS — L820 Inflamed seborrheic keratosis: Secondary | ICD-10-CM | POA: Diagnosis not present

## 2019-02-13 DIAGNOSIS — L821 Other seborrheic keratosis: Secondary | ICD-10-CM | POA: Diagnosis not present

## 2019-02-13 DIAGNOSIS — L0100 Impetigo, unspecified: Secondary | ICD-10-CM | POA: Diagnosis not present

## 2019-02-21 ENCOUNTER — Ambulatory Visit (INDEPENDENT_AMBULATORY_CARE_PROVIDER_SITE_OTHER): Payer: Medicare HMO | Admitting: Orthopaedic Surgery

## 2019-02-21 ENCOUNTER — Encounter: Payer: Self-pay | Admitting: Orthopaedic Surgery

## 2019-02-21 ENCOUNTER — Other Ambulatory Visit: Payer: Self-pay

## 2019-02-21 ENCOUNTER — Ambulatory Visit: Payer: Medicare HMO

## 2019-02-21 ENCOUNTER — Ambulatory Visit (INDEPENDENT_AMBULATORY_CARE_PROVIDER_SITE_OTHER): Payer: Medicare HMO

## 2019-02-21 ENCOUNTER — Telehealth (HOSPITAL_COMMUNITY): Payer: Self-pay | Admitting: Family Medicine

## 2019-02-21 VITALS — BP 135/83 | HR 74 | Temp 97.5°F | Ht 70.0 in | Wt 167.5 lb

## 2019-02-21 DIAGNOSIS — S92342A Displaced fracture of fourth metatarsal bone, left foot, initial encounter for closed fracture: Secondary | ICD-10-CM

## 2019-02-21 DIAGNOSIS — M79672 Pain in left foot: Secondary | ICD-10-CM

## 2019-02-21 DIAGNOSIS — G8929 Other chronic pain: Secondary | ICD-10-CM

## 2019-02-21 DIAGNOSIS — M25532 Pain in left wrist: Secondary | ICD-10-CM | POA: Diagnosis not present

## 2019-02-21 DIAGNOSIS — M25561 Pain in right knee: Secondary | ICD-10-CM

## 2019-02-21 NOTE — Progress Notes (Signed)
Patient Marc [name], male DOB:[birth year], 63 y.o. RAQ:[id number]  Chief Complaint  Patient presents with  . Foot Pain    L/ still hurts/swollen and sore  . Wrist Pain    L/hurts    HPI  Marc [name] is a 63 y.o. male who has continued pain of the left wrist that he says is getting worse.  He has not yet been to OT because of scheduling problems according to him.  I will get MRI of the wrist and he is to go to OT.  He has left foot pain and is wearing regular shoes.  He has a healing fracture of the fourth metatarsal there.  I told him if he continues to walk a lot on the foot, it will hurt some.  He complains of red streaks on his anterior thighs.  He complains of right knee pain, swelling and popping but no giving way.  He has no redness or trauma.   Body mass index is 24.03 kg/m.  ROS  Review of Systems  Constitutional: Positive for activity change.  Musculoskeletal: Positive for arthralgias, gait problem and joint swelling.  All other systems reviewed and are negative.   All other systems reviewed and are negative.  The following is a summary of the past history medically, past history surgically, known current medicines, social history and family history.  This information is gathered electronically by the computer from prior information and documentation.  I review this each visit and have found including this information at this point in the chart is beneficial and informative.    Past Medical History:  Diagnosis Date  . Allergy   . Anxiety   . Arthritis   . Back problem   . Depression   . GERD (gastroesophageal reflux disease)   . Hepatitis C 1979  . Hepatitis C reactive    LIVER BX 2008-CHRONIC ACTIVE HEPATITIS  . HTN (hypertension)   . Hx of adenomatous colonic polyps 2008   due for surveillance 2017  . Hypercholesterolemia   . IBS (irritable bowel syndrome)   . Knee pain   . Leukopenia 11/06/2015  . Normal cardiac stress test 05/25/2011   [facility] [place]  . Normal echocardiogram 05/25/2011   EF 55%, mild TR  . Substance abuse ([place])    narcotic addiction  . Thrombocytopenia ([place]) 11/06/2015    Past Surgical History:  Procedure Laterality Date  . APPENDECTOMY    . CARPAL TUNNEL RELEASE     rt  . COLONOSCOPY  2008 [facility] ARS D100 V8 PHEN 12.5   2 SIMPLE ADENOMAS (< 1 CM)  . ESOPHAGOGASTRODUODENOSCOPY  04/26/2012   Dr. Oneida Alar: Non-erosive gastritis (inflammation) was found in the gastric antrum but no H.pylori; multiple biopsies (duodenal bx negative for Celiac)/ The mucosa of the esophagus appeared normal  . EUS  09/08/2012   Dr. Ardis Hughs: CBD dilated but no stones. Query secondary to Sphincter of Oddi stenosis, ?dysfunction, but clinically without symptoms  . HARDWARE REMOVAL Right 02/12/2014   Procedure: HARDWARE REMOVAL;  Surgeon: Jolyn Nap, MD;  Location: [facility];  Service: Orthopedics;  Laterality: Right;  . KNEE ARTHROSCOPY    . Neurostimulator implant    . right ring finger    . spinal stenosis, had screws put in neck  04/11/2009   DR KRITZER  . TONSILLECTOMY    . ULNA OSTEOTOMY Right 02/12/2014   Procedure: RIGHT ULNAR SHORTENING AND OSTEOTOMY ;  Surgeon: Jolyn Nap, MD;  Location: [facility]  [place] [facility];  Service: Orthopedics;  Laterality: Right;  . ULNAR NERVE TRANSPOSITION    . UPPER GASTROINTESTINAL ENDOSCOPY  2008 [facility] ABD PAIN WEIGHT LOSS d100 v8 phen 12.5   NL  . WRIST SURGERY Right jan 2015   Dr. Edmonia Lynch    Family History  Problem Relation Age of Onset  . Bladder Cancer Mother        rare form   . Cancer Sister        Metastatic abdominal  . Emotional abuse Sister   . Stroke Sister   . Emotional abuse Sister   . Heart failure Father   . Stroke Father   . Alcohol abuse Father   . Dementia Paternal Aunt   . Alcohol abuse Paternal Uncle   . Dementia Paternal Uncle   . Dementia Paternal Grandmother   . Stroke Paternal Grandmother   . ADD / ADHD Cousin    . Bipolar disorder Cousin   . Anxiety disorder Cousin   . Depression Cousin        Committed suicide  . Alcohol abuse Cousin   . OCD Cousin   . Paranoid behavior Cousin   . Brain cancer Maternal Grandfather   . Heart defect Other        FAMILY HX  . Colon cancer Maternal Uncle   . Colon polyps Neg Hx   . Drug abuse Neg Hx   . Schizophrenia Neg Hx   . Seizures Neg Hx   . Sexual abuse Neg Hx   . Physical abuse Neg Hx     Social History Social History   Tobacco Use  . Smoking status: Former Smoker    Packs/day: 1.50    Years: 8.00    Pack years: 12.00    Types: Cigarettes    Quit date: 08/13/1986    Years since quitting: 32.5  . Smokeless tobacco: Never Used  . Tobacco comment: quit 20 + yrs ago  Substance Use Topics  . Alcohol use: No    Alcohol/week: 0.0 standard drinks  . Drug use: No    Allergies  Allergen Reactions  . Levofloxacin Nausea Only and Other (See Comments)    "Creepy" feeling, skin didn't feel right, sort of itchy.  . Ciprofloxacin     Other reaction(s): Sweating (intolerance)  . Sulfa Antibiotics     Current Outpatient Medications  Medication Sig Dispense Refill  . acetaminophen (TYLENOL) 500 MG tablet Take 500 mg by mouth as needed.    . Ascorbic Acid (VITAMIN C PO) Take 1 Dose by mouth daily.    Marland Kitchen aspirin EC 81 MG tablet Take 81 mg by mouth daily.    . Cholecalciferol (VITAMIN D3 PO) Take 1 Dose by mouth daily.    . cloNIDine (CATAPRES) 0.2 MG tablet TAKE (1) TABLET BY MOUTH TWICE DAILY. 60 tablet 5  . mirtazapine (REMERON) 7.5 MG tablet Take 1 tablet (7.5 mg total) by mouth at bedtime. 30 tablet 1  . Multiple Vitamins-Minerals (MULTIVITAMIN ADULTS 50+) TABS Take 1 tablet by mouth daily.    . naproxen (NAPROSYN) 500 MG tablet Take 1 tablet (500 mg total) by mouth 2 (two) times daily with a meal. (Patient not taking: Reported on 02/08/2019) 30 tablet 0  . olopatadine (PATANOL) 0.1 % ophthalmic solution Place 1 drop into both eyes 2 (two) times  daily. 5 mL 2  . Omega-3 Fatty Acids (FISH OIL) 1000 MG CAPS Take 1 capsule by mouth daily.    . ondansetron (ZOFRAN) 4  MG tablet One tablet once daily as needed, for uncontrolled nausea 7 tablet 0  . prazosin (MINIPRESS) 5 MG capsule Take 1 capsule (5 mg total) by mouth at bedtime. 30 capsule 1  . QUEtiapine Fumarate (SEROQUEL XR) 150 MG 24 hr tablet Take 150 mg by mouth daily.     No current facility-administered medications for this visit.      Physical Exam  Blood pressure 135/83, pulse 74, temperature (!) 97.5 F (36.4 C), height 5\' 10"  (1.778 m), weight 167 lb 8 oz (76 kg).  Constitutional: overall normal hygiene, normal nutrition, well developed, normal grooming, normal body habitus. Assistive device:none  Musculoskeletal: gait and station Limp right, muscle tone and strength are normal, no tremors or atrophy is present.  .  Neurological: coordination overall normal.  Deep tendon reflex/nerve stretch intact.  Sensation normal.  Cranial nerves II-XII intact.   Skin:   no scars, lesions, ulcers or rashes. No psoriasis. He does have some scratch marks on both thighs.  There are in line with the natural width of his own hand and fingers.  He says he is not scratching his thighs but I showed him how they look like scratch marks.  He may want to see a dermatologist. He does not believe me.  Psychiatric: Alert and oriented x 3.  Recent memory intact, remote memory unclear.  Normal mood and affect. Well groomed.  Good eye contact.  Cardiovascular: overall no swelling, no varicosities, no edema bilaterally, normal temperatures of the legs and arms, no clubbing, cyanosis and good capillary refill.  Lymphatic: palpation is normal.  Right knee has effusion, ROM 0 to 110, slight medial pain, the knee is stable, slight limp, NV intact.  The left foot has no swelling or redness.  NV intact.  The left wrist has full ROM but he has pain over the distal ulna but no redness.  Grips are near  normal.  All other systems reviewed and are negative   The patient has been educated about the nature of the problem(s) and counseled on treatment options.  The patient appeared to understand what I have discussed and is in agreement with it.  Encounter Diagnoses  Name Primary?  . Chronic pain of right knee Yes  . Pain in left foot   . Pain in left wrist   . Closed fracture of fourth metatarsal bone of left foot, initial encounter    X-rays were done of the right knee, reported separately.  X-rays were done of the left foot, reported separately.  PROCEDURE NOTE:  The patient requests injections of the right knee , verbal consent was obtained.  The right knee was prepped appropriately after time out was performed.   Sterile technique was observed and injection of 1 cc of Depo-Medrol 40 mg with several cc's of plain xylocaine. Anesthesia was provided by ethyl chloride and a 20-gauge needle was used to inject the knee area. The injection was tolerated well.  A band aid dressing was applied.  The patient was advised to apply ice later today and tomorrow to the injection sight as needed.   PLAN Call if any problems.  Precautions discussed.  Continue current medications.   Return to clinic after MRI of the left wrist, I am concerned about ligamentous tear.  Keep OT appointment.   Electronically Signed Sanjuana Kava, MD 7/14/202011:22 AM

## 2019-02-21 NOTE — Telephone Encounter (Signed)
02/22/19  Patient left a message that he thought he had an appointment today but when I looked it is actually for 7/15 at 2pm.  I called him back but had to leave him a message to let him know.  Asked that if he had any other questions not to hesitate to call us back.

## 2019-02-22 ENCOUNTER — Ambulatory Visit: Payer: Medicare HMO | Admitting: Family Medicine

## 2019-02-22 ENCOUNTER — Telehealth: Payer: Self-pay | Admitting: *Deleted

## 2019-02-22 ENCOUNTER — Ambulatory Visit (HOSPITAL_COMMUNITY): Payer: Medicare HMO | Attending: Orthopaedic Surgery | Admitting: Specialist

## 2019-02-22 ENCOUNTER — Encounter (HOSPITAL_COMMUNITY): Payer: Self-pay | Admitting: Specialist

## 2019-02-22 DIAGNOSIS — R29898 Other symptoms and signs involving the musculoskeletal system: Secondary | ICD-10-CM | POA: Diagnosis not present

## 2019-02-22 DIAGNOSIS — M25632 Stiffness of left wrist, not elsewhere classified: Secondary | ICD-10-CM | POA: Diagnosis not present

## 2019-02-22 DIAGNOSIS — M25532 Pain in left wrist: Secondary | ICD-10-CM | POA: Diagnosis not present

## 2019-02-22 DIAGNOSIS — R278 Other lack of coordination: Secondary | ICD-10-CM | POA: Diagnosis not present

## 2019-02-22 NOTE — Telephone Encounter (Signed)
Pt called needing a refill on his quetiapine sent to [facility]

## 2019-02-22 NOTE — Therapy (Signed)
[place], [place], [address] Phone: [phone]   Fax:  [phone]  Occupational Therapy Evaluation  Patient Details  Name: Marc [name] MRN: [id number] Date of Birth: 1955-09-10 Referring Provider (OT): Dr. Sanjuana Kava   Encounter Date: 02/22/2019  OT End of Session - 02/22/19 1542    Visit Number  1    Number of Visits  16    Date for OT Re-Evaluation  04/19/19   mini reassess on 03/22/19   Authorization Type  [employer] - authorization required - submitted on 02/22/19    OT Start Time  1425    OT Stop Time  1515    OT Time Calculation (min)  50 min    Activity Tolerance  Patient tolerated treatment well    Behavior During Therapy  [facility] for tasks assessed/performed       Past Medical History:  Diagnosis Date  . Allergy   . Anxiety   . Arthritis   . Back problem   . Depression   . GERD (gastroesophageal reflux disease)   . Hepatitis C 1979  . Hepatitis C reactive    LIVER BX 2008-CHRONIC ACTIVE HEPATITIS  . HTN (hypertension)   . Hx of adenomatous colonic polyps 2008   due for surveillance 2017  . Hypercholesterolemia   . IBS (irritable bowel syndrome)   . Knee pain   . Leukopenia 11/06/2015  . Normal cardiac stress test 05/25/2011   [facility]  . Normal echocardiogram 05/25/2011   EF 55%, mild TR  . Substance abuse ([place])    narcotic addiction  . Thrombocytopenia ([place]) 11/06/2015    Past Surgical History:  Procedure Laterality Date  . APPENDECTOMY    . CARPAL TUNNEL RELEASE     rt  . COLONOSCOPY  2008 SLF ARS D100 V8 PHEN 12.5   2 SIMPLE ADENOMAS (< 1 CM)  . ESOPHAGOGASTRODUODENOSCOPY  04/26/2012   Dr. Oneida Alar: Non-erosive gastritis (inflammation) was found in the gastric antrum but no H.pylori; multiple biopsies (duodenal bx negative for Celiac)/ The mucosa of the esophagus appeared normal  . EUS  09/08/2012   Dr. Ardis Hughs: CBD dilated but no stones. Query secondary  to Sphincter of Oddi stenosis, ?dysfunction, but clinically without symptoms  . HARDWARE REMOVAL Right 02/12/2014   Procedure: HARDWARE REMOVAL;  Surgeon: Jolyn Nap, MD;  Location: [place];  Service: Orthopedics;  Laterality: Right;  . KNEE ARTHROSCOPY    . Neurostimulator implant    . right ring finger    . spinal stenosis, had screws put in neck  04/11/2009   DR KRITZER  . TONSILLECTOMY    . ULNA OSTEOTOMY Right 02/12/2014   Procedure: RIGHT ULNAR SHORTENING AND OSTEOTOMY ;  Surgeon: Jolyn Nap, MD;  Location: [facility];  Service: Orthopedics;  Laterality: Right;  . ULNAR NERVE TRANSPOSITION    . UPPER GASTROINTESTINAL ENDOSCOPY  2008 SLF ABD PAIN WEIGHT LOSS d100 v8 phen 12.5   NL  . WRIST SURGERY Right jan 2015   Dr. Edmonia Lynch    There were no vitals filed for this visit.  Subjective Assessment - 02/22/19 1535    Subjective   My dog tripped me and I fell on the asphalt.    Pertinent History  Marc [name] reports that around 12/13/18, he was walking his dog, dog tripped him and he landed on his left wrist on pavement.  He reports increased pain and decreased mobility  in his left wrist since this time. He presented to the ED mid May for ongoing wrist and foot pain from the fall.  After consulting with Dr. Luna Glasgow, he has been referred to occupational therapy for evaluation and treatment.    Patient Stated Goals  I want to get full function from my arm.    Currently in Pain?  Yes    Pain Score  5     Pain Location  Wrist    Pain Orientation  Left    Pain Descriptors / Indicators  Aching;Tender    Pain Type  Acute pain    Pain Radiating Towards  forearm    Pain Onset  More than a month ago    Pain Frequency  Constant    Aggravating Factors   use of left arm    Pain Relieving Factors  nothing    Effect of Pain on Daily Activities  yes        [facility] OT Assessment - 02/22/19 0001      Assessment   Medical Diagnosis  Left Wrist Pain      Referring Provider (OT)  Dr. Sanjuana Kava    Onset Date/Surgical Date  12/13/18    Hand Dominance  Right    Prior Therapy  n/a      Precautions   Precautions  None      Restrictions   Weight Bearing Restrictions  No      Balance Screen   Has the patient fallen in the past 6 months  Yes    How many times?  3    Has the patient had a decrease in activity level because of a fear of falling?   No    Is the patient reluctant to leave their home because of a fear of falling?   No      Home  Environment   Family/patient expects to be discharged to:  Private residence    Living Arrangements  Alone      Prior Function   Level of [place]  Retired    Leisure  enjoys playing the guitar, [place] to music, watching tv      ADL   ADL comments  patient reports difficulty and increased pain with playing the guitar, pushing up out of a seated position, washing dishes, driving, coordination tasks such as buttons, shoes, manipulating change       Written Expression   Dominant Hand  Right      Vision - History   Baseline Vision  No visual deficits      Cognition   Overall Cognitive Status  Within Functional Limits for tasks assessed      Observation/Other Assessments   Other Surveys   Select    Quick DASH   38.64      Sensation   Light Touch  Appears Intact    Additional Comments  reports pins and needles, light touch is intact      Coordination   Gross Motor Movements are Fluid and Coordinated  Yes    9 Hole Peg Test  Right;Left    Right 9 Hole Peg Test  33.64"    Left 9 Hole Peg Test  41.96"      ROM / Strength   AROM / PROM / Strength  AROM;Strength;PROM      Palpation   Palpation comment  moderate fascial restrictions in volar and dorsal forearm and wrist area      AROM  AROM Assessment Site  Forearm;Wrist    Right/Left Forearm  Left    Left Forearm Pronation  60 Degrees    Left Forearm Supination  60 Degrees    Right/Left Wrist  Left     Left Wrist Extension  24 Degrees    Left Wrist Flexion  45 Degrees    Left Wrist Radial Deviation  15 Degrees    Left Wrist Ulnar Deviation  8 Degrees      PROM   Overall PROM   Deficits;Due to pain    Overall PROM Comments  75% range with pain      Strength   Strength Assessment Site  Forearm;Wrist    Right/Left Forearm  Left    Left Forearm Pronation  4+/5    Left Forearm Supination  4+/5    Right/Left Wrist  Left    Left Wrist Flexion  4+/5    Left Wrist Extension  4+/5    Left Wrist Radial Deviation  4+/5    Left Wrist Ulnar Deviation  4+/5      Hand Function   Right Hand Grip (lbs)  60    Right Hand Lateral Pinch  24 lbs    Right Hand 3 Point Pinch  12 lbs    Left Hand Grip (lbs)  30    Left Hand Lateral Pinch  15 lbs    Left 3 point pinch  4 lbs          Quick Dash - 02/22/19 0001    Open a tight or new jar  Moderate difficulty    Do heavy household chores (wash walls, wash floors)  Moderate difficulty    Carry a shopping bag or briefcase  No difficulty    Wash your back  Moderate difficulty    Use a knife to cut food  No difficulty    Recreational activities in which you take some force or impact through your arm, shoulder, or hand (golf, hammering, tennis)  Moderate difficulty    During the past week, to what extent has your arm, shoulder or hand problem interfered with your normal social activities with family, friends, neighbors, or groups?  Modererately    During the past week, to what extent has your arm, shoulder or hand problem limited your work or other regular daily activities  Modererately    Arm, shoulder, or hand pain.  Moderate    Tingling (pins and needles) in your arm, shoulder, or hand  Mild    Difficulty Sleeping  Moderate difficulty    DASH Score  38.64 %          OT Treatments/Exercises (OP) - 02/22/19 0001      Manual Therapy   Manual Therapy  Myofascial release    Manual therapy comments  manual therapy completed seperately from  all other interventions    Myofascial Release  myofascial release and manual stretching to left volar and dorsal forearm and wrist region with passive stretching into flexion and extension in available range.     Kinesiotex  Ligament Correction   wrist stabiilty           OT Education - 02/22/19 1541    Education Details  educated patient on HEP for supination/pronation, wrist flexion/extension, radial deviaton, ulnar deviation and grip/pinch strengthening with red theraputty    Person(s) Educated  Patient    Methods  Explanation;Demonstration;Handout    Comprehension  Verbalized understanding;Returned demonstration       OT Short Term Goals - 02/22/19  [place] #1   Title  Patient will be educated on a HEP for improved left upper extremity use.    Time  4    Period  Weeks    Status  New    Target Date  03/22/19      OT SHORT TERM GOAL #2   Title  Patient will improve left wrist p/rom to WNL for improved ability to maintain position on steering wheel.    Time  4    Period  Weeks    Status  New      OT SHORT TERM GOAL #3   Title  Patient will improve left grip strength by 5# and pinch strength by 2# for improved ability to open containers.    Time  4    Period  Weeks    Status  New      OT SHORT TERM GOAL #4   Title  Patient will improve fine motor coordination as evidence by decreasing completion time on nine hole peg test by 5 seconds for improved ability to fasten buttons.    Time  4    Period  Weeks    Status  New      OT SHORT TERM GOAL #5   Title  Patient will decrease pain in his left wrist to 3/10 or better when opening doors.    Time  4    Period  Weeks    Status  New        OT Long Term Goals - 02/22/19 1554      OT LONG TERM GOAL #1   Title  Patient will return to PLOF using his left arm with all functional activities without incident.    Time  8    Period  Weeks    Status  New    Target Date  04/19/19      OT LONG TERM  GOAL #2   Title  Patient will improve left wrist and forearm a/rom to WNL for improved ability to play the guitar.    Time  8    Period  Weeks    Status  New      OT LONG TERM GOAL #3   Title  Patient will improve left forearm and wrist strength to 5/5 for improved ability to pick up gardening tools.    Time  8    Period  Weeks    Status  New      OT LONG TERM GOAL #4   Title  Patient will improve left grip strength to 50 pounds or more and pinch strength by 10 pounds for improved ability to maintain grasp on utensils and tools.    Time  8    Period  Weeks    Status  New      OT LONG TERM GOAL #5   Title  Patient will improve fine motor coordiation as evidence by completing nine hole peg test in 35" or less for improved ability to manipulate change.    Time  8    Period  Weeks    Status  New      Long Term Additional Goals   Additional Long Term Goals  Yes      OT LONG TERM GOAL #6   Title  Patient will decrease pain in his left wrist to 2/10 or better when turning the steering wheel.    Time  8    Period  Weeks    Status  New            Plan - 02/22/19 1546    Clinical Impression Statement  A:  Patient is a 63 year old male with complicated medical history, including current 4th left metatarsal fracture, history of right arm surgery with hardware placement, left ulnar nerve reposition, carpal tunnel release, left hand trigger finger, right knee pain, left foot pain, back pain with nerve stimulator.  In May, patient fell, landing on his left arm.  He has noted decreased mobiilty and increased pain since that time.  He is unable to complete functional activities such as playing the guitar, pushing himself out of a chair, washing dishes, driving, without significant pain.    OT Occupational Profile and History  Detailed Assessment- Review of Records and additional review of physical, cognitive, psychosocial history related to current functional performance    Occupational  performance deficits (Please refer to evaluation for details):  ADL's;IADL's;Rest and Sleep;Leisure    Body Structure / Function / Physical Skills  ADL;Strength;Pain;Dexterity;UE functional use;IADL;ROM;Fascial restriction;Flexibility;FMC;Muscle spasms    Rehab Potential  Good    Clinical Decision Making  Several treatment options, min-mod task modification necessary    Comorbidities Affecting Occupational Performance:  Presence of comorbidities impacting occupational performance    Comorbidities impacting occupational performance description:  A:  Patient is a 62 year old male with complicated medical history, including current 4th left metatarsal fracture, history of right arm surgery with hardware placement, left ulnar nerve reposition, carpal tunnel release, left hand trigger finger, right knee pain, left foot pain, back pain with nerve stimulator.    Modification or Assistance to Complete Evaluation   Min-Moderate modification of tasks or assist with assess necessary to complete eval    OT Frequency  2x / week    OT Duration  8 weeks    OT Treatment/Interventions  Self-care/ADL training;Moist Heat;DME and/or AE instruction;Splinting;Therapeutic activities;Ultrasound;Therapeutic exercise;Passive range of motion;Neuromuscular education;Cryotherapy;Energy conservation;Manual Therapy;Patient/family education    Plan  P:  SKilled OT intervention to decrease pain and improve rom, strength, coordination in left hand and arm in order to return to prior level of function with all desired activities.  Next session:  follow up on kinesotape results, begin manual therapy and therapeutic exercises focused on improving mobility.    OT Home Exercise Plan  forearm and wrist a/rom, grip strengthening       Patient will benefit from skilled therapeutic intervention in order to improve the following deficits and impairments:   Body Structure / Function / Physical Skills: ADL, Strength, Pain, Dexterity, UE  functional use, IADL, ROM, Fascial restriction, Flexibility, FMC, Muscle spasms       Visit Diagnosis: 1. Pain in left wrist   2. Stiffness of left wrist, not elsewhere classified   3. Other lack of coordination   4. Other symptoms and signs involving the musculoskeletal system       Problem List Patient Active Problem List   Diagnosis Date Noted  . Encephalopathy acute 12/30/2017  . Acute encephalopathy 12/29/2017  . Left forearm pain 08/15/2017  . Acute pain of left wrist 08/15/2017  . Chest wall pain 03/28/2017  . Annual physical exam 10/20/2016  . History of colonic polyps 11/13/2015  . Leukopenia 11/06/2015  . Thrombocytopenia ([place]) 11/06/2015  . Back muscle spasm 10/21/2015  . HCV (hepatitis C virus) 06/16/2015  . Nocturia 06/16/2015  . MDD (major depressive disorder) 01/21/2015  . Seasonal allergies 12/27/2014  . Dilation of biliary tract  07/14/2012  . Epigastric pain 07/14/2012  . RUQ pain 07/14/2012  . Trigger middle finger of left hand 06/20/2012  . Vitamin D deficiency 06/20/2012  . Chronic pain syndrome 12/24/2011  . Narcotic addiction ([place]) 06/10/2011  . Essential hypertension 09/06/2010  . Prediabetes 04/10/2010  . INSOMNIA 11/07/2009  . Anxiety and depression 09/04/2009  . Chronic hepatitis C without hepatic coma ([place]) 01/04/2009  . IRRITABLE BOWEL SYNDROME 01/04/2009  . GERD 01/03/2009    Vangie Bicker, [place], OTR/L [phone]  02/22/2019, 4:09 PM  [place] [address] [place], [place], [address] Phone: [phone]   Fax:  [phone]  Name: Marc [name] MRN: [id number] Date of Birth: Dec 23, 1955

## 2019-02-22 NOTE — Telephone Encounter (Signed)
Needs to get from Dr Harrington Challenger

## 2019-02-22 NOTE — Patient Instructions (Signed)
Complete each exercise 10 times each 2-3 times per day  AROM: Wrist Flexion / Extension  Actively bend right wrist forward then back as far as possible. Repeat ____ times per set. Do ____ sets per session. Do ____ sessions per day.  Copyright  VHI. All rights reserved.  AROM: Wrist Radial / Ulnar Deviation   Gently bend left wrist from side to side as far as possible. Repeat ____ times per set. Do ____ sets per session. Do ____ sessions per day.  Copyright  VHI. All rights reserved.  AROM: Forearm Pronation / Supination   With right arm in handshake position, slowly rotate palm down until stretch is felt. Relax. Then rotate palm up until stretch is felt. Repeat ____ times per set. Do ____ sets per session. Do ____ sessions per day.  Copyright  VHI. All rights reserved.   Home Exercises Program Theraputty Exercises  Do the following exercises 2-3 times a day using your affected hand.  1. Roll putty into a ball.  2. Make into a pancake.  3. Roll putty into a roll.  4. Pinch along log with first finger and thumb.   5. Make into a ball.  6. Roll it back into a log.   7. Pinch using thumb and side of first finger.  8. Roll into a ball, then flatten into a pancake.  9. Using your fingers, make putty into a mountain.

## 2019-02-23 NOTE — Telephone Encounter (Signed)
LVM for pt to let him know to get these refilled from Dr. Harrington Challenger

## 2019-02-27 ENCOUNTER — Other Ambulatory Visit: Payer: Self-pay | Admitting: Family Medicine

## 2019-02-28 ENCOUNTER — Ambulatory Visit (INDEPENDENT_AMBULATORY_CARE_PROVIDER_SITE_OTHER): Payer: Medicare HMO | Admitting: Family Medicine

## 2019-02-28 ENCOUNTER — Encounter: Payer: Self-pay | Admitting: Family Medicine

## 2019-02-28 ENCOUNTER — Other Ambulatory Visit: Payer: Self-pay

## 2019-02-28 VITALS — BP 140/80 | HR 78 | Temp 98.8°F | Resp 15 | Ht 70.0 in | Wt 169.0 lb

## 2019-02-28 DIAGNOSIS — Z125 Encounter for screening for malignant neoplasm of prostate: Secondary | ICD-10-CM | POA: Diagnosis not present

## 2019-02-28 DIAGNOSIS — E559 Vitamin D deficiency, unspecified: Secondary | ICD-10-CM

## 2019-02-28 DIAGNOSIS — Z23 Encounter for immunization: Secondary | ICD-10-CM | POA: Diagnosis not present

## 2019-02-28 DIAGNOSIS — I10 Essential (primary) hypertension: Secondary | ICD-10-CM

## 2019-02-28 DIAGNOSIS — Z79899 Other long term (current) drug therapy: Secondary | ICD-10-CM | POA: Diagnosis not present

## 2019-02-28 DIAGNOSIS — F332 Major depressive disorder, recurrent severe without psychotic features: Secondary | ICD-10-CM | POA: Diagnosis not present

## 2019-02-28 NOTE — Patient Instructions (Addendum)
Annual physical exam with MD last week in August, call if you need me sooner  Prevar 13 today  Please  get fasting CBC, lipid, cmp and EGFR, TSH and PSA in the next 1 week  I will reach out to Dr Harrington Challenger and therapist to try to get you an appointment  CONTINUE daily rehab sessions   Thanks for choosing [facility], we consider it a privelige to serve you.

## 2019-02-28 NOTE — Progress Notes (Signed)
   Marc [name]     MRN: [id number]      DOB: 1956-04-09   HPI Mr. Godown is here for follow up and re-evaluation of chronic medical conditions, medication management and review of any available recent lab and radiology data.  Preventive health is updated, specifically  Cancer screening and Immunization.   Questions or concerns regarding consultations or procedures which the PT has had in the interim are  addressed. The PT denies any adverse reactions to current medications since the last visit.  Out of rehab in [place] after a 21day  Stay where he was involuntarily for alcohol and drug addiction from  May 16 to June 3 States his son  Had him committed. Needs medication and to re establish with Psychiatry, also requesting return to previous therapist P. Bynum  ROS Denies recent fever or chills. Denies sinus pressure, nasal congestion, ear pain or sore throat. Denies chest congestion, productive cough or wheezing. Denies chest pains, palpitations and leg swelling Denies abdominal pain, nausea, vomiting,diarrhea or constipation.   Denies dysuria, frequency, hesitancy or incontinence. Denies joint pain, swelling and limitation in mobility. Denies headaches, seizures, numbness, or tingling. C/o  depression, anxiety and  Insomnia.Not suicidal or homicidal Denies skin break down or rash.   PE  BP 140/80   Pulse 78   Temp 98.8 F (37.1 C) (Temporal)   Resp 15   Ht 5\' 10"  (1.778 m)   Wt 169 lb (76.7 kg)   SpO2 98%   BMI 24.25 kg/m   Patient alert and oriented and in no cardiopulmonary distress.  HEENT: No facial asymmetry, EOMI,   oropharynx pink and moist.  Neck supple no JVD, no mass.  Chest: Clear to auscultation bilaterally.  CVS: S1, S2 no murmurs, no S3.Regular rate.  ABD: Soft non tender.   Ext: No edema  MS: Adequate ROM spine, shoulders, hips and knees.  Skin: Intact, no ulcerations or rash noted.  Psych: Good eye contact, flat  affect. Memory intact not  anxious or depressed appearing.  CNS: CN 2-12 intact, power,  normal throughout.no focal deficits noted.   Assessment & Plan  MDD (major depressive disorder) Seroquel prescribed and pt referred to Psychiatry and therapist  Need for vaccination with 13-polyvalent pneumococcal conjugate vaccine After obtaining informed consent, the vaccine is  administered , with no adverse effect noted at the time of administration.

## 2019-03-01 ENCOUNTER — Encounter: Payer: Self-pay | Admitting: Family Medicine

## 2019-03-01 DIAGNOSIS — Z23 Encounter for immunization: Secondary | ICD-10-CM | POA: Insufficient documentation

## 2019-03-01 MED ORDER — QUETIAPINE FUMARATE ER 150 MG PO TB24: 150.0000 mg | ORAL_TABLET | Freq: Every day | ORAL | 1 refills | Status: DC

## 2019-03-01 NOTE — Assessment & Plan Note (Signed)
After obtaining informed consent, the vaccine is  administered , with no adverse effect noted at the time of administration.  

## 2019-03-01 NOTE — Telephone Encounter (Signed)
Pt called said Dr. Moshe Cipro was supposed to send in his quetiapine 150 mg Er and he went by the pharmacy and they did not have the prescription. Said he was completely out.

## 2019-03-01 NOTE — Assessment & Plan Note (Signed)
Seroquel prescribed and pt referred to Psychiatry and therapist

## 2019-03-03 ENCOUNTER — Ambulatory Visit (HOSPITAL_COMMUNITY): Payer: Medicare HMO | Admitting: Specialist

## 2019-03-03 ENCOUNTER — Telehealth (HOSPITAL_COMMUNITY): Payer: Self-pay | Admitting: Family Medicine

## 2019-03-03 NOTE — Telephone Encounter (Signed)
patient left a message to [facility] he just said that he couldn't make it today  03/03/19

## 2019-03-08 ENCOUNTER — Ambulatory Visit (HOSPITAL_COMMUNITY): Payer: Medicare HMO | Admitting: Specialist

## 2019-03-09 ENCOUNTER — Telehealth: Payer: Self-pay | Admitting: Radiology

## 2019-03-09 NOTE — Telephone Encounter (Signed)
MRI department has called him x 3 to schedule MRI, patient states he called left message previously, and will try again to call them.

## 2019-03-10 ENCOUNTER — Ambulatory Visit (HOSPITAL_COMMUNITY): Payer: Medicare HMO | Admitting: Specialist

## 2019-03-14 ENCOUNTER — Ambulatory Visit (INDEPENDENT_AMBULATORY_CARE_PROVIDER_SITE_OTHER): Payer: Medicare HMO | Admitting: Psychiatry

## 2019-03-14 ENCOUNTER — Encounter (HOSPITAL_COMMUNITY): Payer: Self-pay | Admitting: Psychiatry

## 2019-03-14 ENCOUNTER — Ambulatory Visit (HOSPITAL_COMMUNITY)
Admission: RE | Admit: 2019-03-14 | Discharge: 2019-03-14 | Disposition: A | Discharge status: Home / Self Care | Payer: Medicare HMO | Source: Ambulatory Visit | Attending: Orthopaedic Surgery | Admitting: Orthopaedic Surgery

## 2019-03-14 ENCOUNTER — Other Ambulatory Visit: Payer: Self-pay

## 2019-03-14 DIAGNOSIS — F4310 Post-traumatic stress disorder, unspecified: Secondary | ICD-10-CM

## 2019-03-14 DIAGNOSIS — M25532 Pain in left wrist: Secondary | ICD-10-CM | POA: Insufficient documentation

## 2019-03-14 DIAGNOSIS — F331 Major depressive disorder, recurrent, moderate: Secondary | ICD-10-CM | POA: Diagnosis not present

## 2019-03-14 DIAGNOSIS — M19032 Primary osteoarthritis, left wrist: Secondary | ICD-10-CM | POA: Diagnosis not present

## 2019-03-14 DIAGNOSIS — S60222A Contusion of left hand, initial encounter: Secondary | ICD-10-CM | POA: Diagnosis not present

## 2019-03-14 DIAGNOSIS — M25442 Effusion, left hand: Secondary | ICD-10-CM | POA: Diagnosis not present

## 2019-03-14 MED ORDER — PRAZOSIN HCL 5 MG PO CAPS: 5.0000 mg | ORAL_CAPSULE | Freq: Every day | ORAL | 1 refills | Status: DC

## 2019-03-14 MED ORDER — QUETIAPINE FUMARATE ER 150 MG PO TB24: ORAL_TABLET | ORAL | 0 refills | Status: DC

## 2019-03-14 MED ORDER — MIRTAZAPINE 15 MG PO TABS: 15.0000 mg | ORAL_TABLET | Freq: Every day | ORAL | 2 refills | Status: DC

## 2019-03-14 NOTE — Progress Notes (Signed)
Virtual Visit via Video Note  I connected with Marc [name] on 03/14/19 at  1:20 PM EDT by a video enabled telemedicine application and verified that I am speaking with the correct person using two identifiers.   I discussed the limitations of evaluation and management by telemedicine and the availability of in person appointments. The patient expressed understanding and agreed to proceed.    I discussed the assessment and treatment plan with the patient. The patient was provided an opportunity to ask questions and all were answered. The patient agreed with the plan and demonstrated an understanding of the instructions.   The patient was advised to call back or seek an in-person evaluation if the symptoms worsen or if the condition fails to improve as anticipated.  I provided 15 minutes of non-face-to-face time during this encounter.   [name], MD  [facility] MD/PA/NP OP Progress Note  03/14/2019 1:48 PM Marc [name]  MRN:  [id number]  Chief Complaint:  Chief Complaint    Depression; Anxiety; Alcohol Problem; Follow-up     HPI: This patient is a 63 year old divorced white male who lives alone in [place].  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  He has 1 son who lives in [place].  The patient returns after long absence.  He has not been seen since last January.  He admits that around February he got back into a "bout of drinking."  He states he was drinking at least 8 beers a day and was taking care of his hygiene and his appearance are his health.  He missed several appointments here.  His son figured out that things were not right and did not intervention and got him into treatment in [facility] for detox and then he was transferred to [facility] treatment center from May 15 to approximately June 3.  Since getting back the patient has not been drinking anymore.  He is going to [employer] every single day.  He still feels somewhat lonely and  isolated.  He had to give the dog away because the dog was poorly controlled and caused him to both sprained his ankle and hurt his wrist.  Right now he does not feel ready to get another dog.  He states that when he first went through detox he had nightmares but he is on prazosin which is helping to some degree.  He still a bit depressed and is on mirtazapine which we can increase a bit.  He is somewhat lonely and needs to keep in touch with people which she is trying to do.  He denies any thoughts of suicide or self-harm Visit Diagnosis:    ICD-10-CM   1. Major depressive disorder, recurrent, moderate (HCC)  F33.1   2. PTSD (post-traumatic stress disorder)  F43.10 prazosin (MINIPRESS) 5 MG capsule    Past Psychiatric History several hospitalizations for alcohol detox, the last one being 3 months ago  Past Medical History:  Past Medical History:  Diagnosis Date  . Acute encephalopathy 12/29/2017   Hospitalized 5/22 to 5/23 with acute encephalopathy, unclear etiology  . Allergy   . Anxiety   . Arthritis   . Back problem   . Depression   . GERD (gastroesophageal reflux disease)   . Hepatitis C 1979  . Hepatitis C reactive    LIVER BX 2008-CHRONIC ACTIVE HEPATITIS  . HTN (hypertension)   . Hx of adenomatous colonic polyps 2008   due for surveillance 2017  . Hypercholesterolemia   .  IBS (irritable bowel syndrome)   . Knee pain   . Leukopenia 11/06/2015  . Normal cardiac stress test 05/25/2011   [facility]  . Normal echocardiogram 05/25/2011   EF 55%, mild TR  . Substance abuse ([place])    narcotic addiction  . Thrombocytopenia ([place]) 11/06/2015    Past Surgical History:  Procedure Laterality Date  . APPENDECTOMY    . CARPAL TUNNEL RELEASE     rt  . COLONOSCOPY  2008 [facility] ARS D100 V8 PHEN 12.5   2 SIMPLE ADENOMAS (< 1 CM)  . ESOPHAGOGASTRODUODENOSCOPY  04/26/2012   Dr. Oneida Alar: Non-erosive gastritis (inflammation) was found in the gastric antrum but no H.pylori;  multiple biopsies (duodenal bx negative for Celiac)/ The mucosa of the esophagus appeared normal  . EUS  09/08/2012   Dr. Ardis Hughs: CBD dilated but no stones. Query secondary to Sphincter of Oddi stenosis, ?dysfunction, but clinically without symptoms  . HARDWARE REMOVAL Right 02/12/2014   Procedure: HARDWARE REMOVAL;  Surgeon: Jolyn Nap, MD;  Location: [place];  Service: Orthopedics;  Laterality: Right;  . KNEE ARTHROSCOPY    . Neurostimulator implant    . right ring finger    . spinal stenosis, had screws put in neck  04/11/2009   DR KRITZER  . TONSILLECTOMY    . ULNA OSTEOTOMY Right 02/12/2014   Procedure: RIGHT ULNAR SHORTENING AND OSTEOTOMY ;  Surgeon: Jolyn Nap, MD;  Location: [place];  Service: Orthopedics;  Laterality: Right;  . ULNAR NERVE TRANSPOSITION    . UPPER GASTROINTESTINAL ENDOSCOPY  2008 [facility] ABD PAIN WEIGHT LOSS d100 v8 phen 12.5   NL  . WRIST SURGERY Right jan 2015   Dr. Edmonia Lynch    Family Psychiatric History: see below  Family History:  Family History  Problem Relation Age of Onset  . Bladder Cancer Mother        rare form   . Cancer Sister        Metastatic abdominal  . Emotional abuse Sister   . Stroke Sister   . Emotional abuse Sister   . Heart failure Father   . Stroke Father   . Alcohol abuse Father   . Dementia Paternal Aunt   . Alcohol abuse Paternal Uncle   . Dementia Paternal Uncle   . Dementia Paternal Grandmother   . Stroke Paternal Grandmother   . ADD / ADHD Cousin   . Bipolar disorder Cousin   . Anxiety disorder Cousin   . Depression Cousin        Committed suicide  . Alcohol abuse Cousin   . OCD Cousin   . Paranoid behavior Cousin   . Brain cancer Maternal Grandfather   . Heart defect Other        FAMILY HX  . Colon cancer Maternal Uncle   . Colon polyps Neg Hx   . Drug abuse Neg Hx   . Schizophrenia Neg Hx   . Seizures Neg Hx   . Sexual abuse Neg Hx   . Physical abuse Neg Hx      Social History:  Social History   Socioeconomic History  . Marital status: Divorced    Spouse name: Not on file  . Number of children: Not on file  . Years of education: Not on file  . Highest education level: Not on file  Occupational History  . Not on file  Social Needs  . Financial resource strain: Not hard at all  .  Food insecurity    Worry: Never true    Inability: Never true  . Transportation needs    Medical: No    Non-medical: No  Tobacco Use  . Smoking status: Former Smoker    Packs/day: 1.50    Years: 8.00    Pack years: 12.00    Types: Cigarettes    Quit date: 08/13/1986    Years since quitting: 32.6  . Smokeless tobacco: Never Used  . Tobacco comment: quit 20 + yrs ago  Substance and Sexual Activity  . Alcohol use: No    Alcohol/week: 0.0 standard drinks  . Drug use: No  . Sexual activity: Never  Lifestyle  . Physical activity    Days per week: 0 days    Minutes per session: 0 min  . Stress: Very much  Relationships  . Social connections    Talks on phone: More than three times a week    Gets together: Three times a week    Attends religious service: Never    Active member of club or organization: No    Attends meetings of clubs or organizations: Never    Relationship status: Divorced  Other Topics Concern  . Not on file  Social History Narrative  . Not on file    Allergies:  Allergies  Allergen Reactions  . Levofloxacin Nausea Only and Other (See Comments)    "Creepy" feeling, skin didn't feel right, sort of itchy.  . Ciprofloxacin     Other reaction(s): Sweating (intolerance)  . Sulfa Antibiotics     Metabolic Disorder Labs: Lab Results  Component Value Date   HGBA1C 5.5 12/30/2017   MPG 111.15 12/30/2017   MPG 111 10/22/2016   No results found for: PROLACTIN Lab Results  Component Value Date   CHOL 229 (H) 10/22/2016   TRIG 134 10/22/2016   HDL 57 10/22/2016   CHOLHDL 4.0 10/22/2016   VLDL 27 10/22/2016   LDLCALC 145 (H)  10/22/2016   LDLCALC 100 10/21/2015   Lab Results  Component Value Date   TSH 2.056 12/30/2017   TSH 1.724 10/22/2016    Therapeutic Level Labs: No results found for: LITHIUM No results found for: VALPROATE No components found for:  CBMZ  Current Medications: Current Outpatient Medications  Medication Sig Dispense Refill  . acetaminophen (TYLENOL) 500 MG tablet Take 500 mg by mouth as needed.    . Ascorbic Acid (VITAMIN C PO) Take 1 Dose by mouth daily.    Marland Kitchen aspirin EC 81 MG tablet Take 81 mg by mouth daily.    . Cholecalciferol (VITAMIN D3 PO) Take 1 Dose by mouth daily.    . cloNIDine (CATAPRES) 0.2 MG tablet TAKE (1) TABLET BY MOUTH TWICE DAILY. 60 tablet 5  . dimenhyDRINATE (DRAMAMINE) 50 MG tablet Take 50 mg by mouth every 8 (eight) hours as needed for itching.    . mirtazapine (REMERON) 15 MG tablet Take 1 tablet (15 mg total) by mouth at bedtime. 30 tablet 2  . mirtazapine (REMERON) 7.5 MG tablet Take 1 tablet (7.5 mg total) by mouth at bedtime. 30 tablet 1  . Multiple Vitamins-Minerals (MULTIVITAMIN ADULTS 50+) TABS Take 1 tablet by mouth daily.    . naproxen (NAPROSYN) 500 MG tablet Take 1 tablet (500 mg total) by mouth 2 (two) times daily with a meal. 30 tablet 0  . olopatadine (PATANOL) 0.1 % ophthalmic solution Place 1 drop into both eyes 2 (two) times daily. 5 mL 2  . Omega-3 Fatty Acids (FISH  OIL) 1000 MG CAPS Take 1 capsule by mouth daily.    . ondansetron (ZOFRAN) 4 MG tablet One tablet once daily as needed, for uncontrolled nausea 7 tablet 0  . prazosin (MINIPRESS) 5 MG capsule Take 1 capsule (5 mg total) by mouth at bedtime. 30 capsule 1  . QUEtiapine Fumarate (SEROQUEL XR) 150 MG 24 hr tablet TAKE (1) TABLET BY MOUTH AT BEDTIME. 30 tablet 0   No current facility-administered medications for this visit.      Musculoskeletal: Strength & Muscle Tone: within normal limits Gait & Station: normal Patient leans: N/A  Psychiatric Specialty Exam: Review of Systems   Musculoskeletal: Positive for back pain and joint pain.  Psychiatric/Behavioral: Positive for depression.  All other systems reviewed and are negative.   There were no vitals taken for this visit.There is no height or weight on file to calculate BMI.  General Appearance: Casual and Fairly Groomed  Eye Contact:  Good  Speech:  Clear and Coherent  Volume:  Decreased  Mood:  Dysphoric  Affect:  Appropriate and Congruent  Thought Process:  Goal Directed  Orientation:  Full (Time, Place, and Person)  Thought Content: Rumination   Suicidal Thoughts:  No  Homicidal Thoughts:  No  Memory:  Immediate;   Good Recent;   Good Remote;   Good  Judgement:  Fair  Insight:  Fair  Psychomotor Activity:  Decreased  Concentration:  Concentration: Good and Attention Span: Good  Recall:  Good  Fund of Knowledge: Good  Language: Good  Akathisia:  No  Handed:  Right  AIMS (if indicated): not done  Assets:  Communication Skills Desire for Improvement Resilience Social Support Talents/Skills  ADL's:  Intact  Cognition: WNL  Sleep:  Good   Screenings: PHQ2-9     Office Visit from 02/28/2019 in [place] from 02/08/2019 in [place] Visit from 01/19/2019 in [place] Visit from 03/15/2018 in [place] from 05/05/2017 in [facility]  PHQ-2 Total Score  4  3  1  4  1   PHQ-9 Total Score  20  19  -  11  5       Assessment and Plan: This patient is a 63 year old male with a long term history of depression and alcohol abuse.  He is back from rehab and doing better in terms of alcohol abstinence.  However he is somewhat depressed and had a high reading on his PHQ 9 score of 20.  He denies any thoughts of self-harm or suicide.  I told him we would start by increasing mirtazapine to 15 mg at bedtime, continuing prazosin 2 mg at bedtime for sleep and Seroquel X are 150 mg at bedtime for mood  stabilization.  He is going to restart counseling here with Maurice Small and will return to see me in 4 weeks   [name], MD 03/14/2019, 1:48 PM

## 2019-03-15 ENCOUNTER — Ambulatory Visit (HOSPITAL_COMMUNITY): Payer: Medicare HMO

## 2019-03-15 ENCOUNTER — Telehealth (HOSPITAL_COMMUNITY): Payer: Self-pay

## 2019-03-15 NOTE — Telephone Encounter (Signed)
Pt called to cx today due to lack of sleep /ask him about 8/19 he agreed to cx and be put on waitlist if something opens

## 2019-03-16 ENCOUNTER — Other Ambulatory Visit: Payer: Self-pay

## 2019-03-16 ENCOUNTER — Ambulatory Visit (HOSPITAL_COMMUNITY): Payer: Medicare HMO | Admitting: Psychiatry

## 2019-03-16 ENCOUNTER — Telehealth (HOSPITAL_COMMUNITY): Payer: Self-pay | Admitting: Psychiatry

## 2019-03-16 NOTE — Telephone Encounter (Signed)
Therapist attempted to contact patient twice via text for scheduled appointment twice, no response. Therapist called patient and left message indicating attempt to contact patient for scheduled appointment and requested patient call office.

## 2019-03-17 ENCOUNTER — Ambulatory Visit (HOSPITAL_COMMUNITY): Payer: Medicare HMO | Attending: Orthopaedic Surgery | Admitting: Occupational Therapy

## 2019-03-17 ENCOUNTER — Encounter (HOSPITAL_COMMUNITY): Payer: Self-pay | Admitting: Occupational Therapy

## 2019-03-17 ENCOUNTER — Other Ambulatory Visit: Payer: Self-pay

## 2019-03-17 DIAGNOSIS — M25632 Stiffness of left wrist, not elsewhere classified: Secondary | ICD-10-CM | POA: Diagnosis not present

## 2019-03-17 DIAGNOSIS — R29898 Other symptoms and signs involving the musculoskeletal system: Secondary | ICD-10-CM | POA: Diagnosis not present

## 2019-03-17 DIAGNOSIS — M25532 Pain in left wrist: Secondary | ICD-10-CM

## 2019-03-17 DIAGNOSIS — R278 Other lack of coordination: Secondary | ICD-10-CM | POA: Insufficient documentation

## 2019-03-17 NOTE — Therapy (Signed)
[place], [place], [address] Phone: [phone]   Fax:  [phone]  Occupational Therapy Reassessment, Treatment, Discharge  Patient Details  Name: Marc [name] MRN: [id number] Date of Birth: Aug 24, 1955 Referring Provider (OT): Dr. Sanjuana Kava   Encounter Date: 03/17/2019  OT End of Session - 03/17/19 1616    Visit Number  2    Number of Visits  16    Date for OT Re-Evaluation  04/19/19   mini reassess on 03/22/19   Authorization Type  [employer] - authorization required - submitted on 02/22/19    OT Start Time  1515    OT Stop Time  1554    OT Time Calculation (min)  39 min    Activity Tolerance  Patient tolerated treatment well    Behavior During Therapy  [facility] for tasks assessed/performed       Past Medical History:  Diagnosis Date  . Acute encephalopathy 12/29/2017   Hospitalized 5/22 to 5/23 with acute encephalopathy, unclear etiology  . Allergy   . Anxiety   . Arthritis   . Back problem   . Depression   . GERD (gastroesophageal reflux disease)   . Hepatitis C 1979  . Hepatitis C reactive    LIVER BX 2008-CHRONIC ACTIVE HEPATITIS  . HTN (hypertension)   . Hx of adenomatous colonic polyps 2008   due for surveillance 2017  . Hypercholesterolemia   . IBS (irritable bowel syndrome)   . Knee pain   . Leukopenia 11/06/2015  . Normal cardiac stress test 05/25/2011   [facility]  . Normal echocardiogram 05/25/2011   EF 55%, mild TR  . Substance abuse ([place])    narcotic addiction  . Thrombocytopenia ([place]) 11/06/2015    Past Surgical History:  Procedure Laterality Date  . APPENDECTOMY    . CARPAL TUNNEL RELEASE     rt  . COLONOSCOPY  2008 [facility] ARS D100 V8 PHEN 12.5   2 SIMPLE ADENOMAS (< 1 CM)  . ESOPHAGOGASTRODUODENOSCOPY  04/26/2012   Dr. Oneida Alar: Non-erosive gastritis (inflammation) was found in the gastric antrum but no H.pylori; multiple biopsies (duodenal bx negative  for Celiac)/ The mucosa of the esophagus appeared normal  . EUS  09/08/2012   Dr. Ardis Hughs: CBD dilated but no stones. Query secondary to Sphincter of Oddi stenosis, ?dysfunction, but clinically without symptoms  . HARDWARE REMOVAL Right 02/12/2014   Procedure: HARDWARE REMOVAL;  Surgeon: Jolyn Nap, MD;  Location: [facility];  Service: Orthopedics;  Laterality: Right;  . KNEE ARTHROSCOPY    . Neurostimulator implant    . right ring finger    . spinal stenosis, had screws put in neck  04/11/2009   DR KRITZER  . TONSILLECTOMY    . ULNA OSTEOTOMY Right 02/12/2014   Procedure: RIGHT ULNAR SHORTENING AND OSTEOTOMY ;  Surgeon: Jolyn Nap, MD;  Location: [facility];  Service: Orthopedics;  Laterality: Right;  . ULNAR NERVE TRANSPOSITION    . UPPER GASTROINTESTINAL ENDOSCOPY  2008 SLF ABD PAIN WEIGHT LOSS d100 v8 phen 12.5   NL  . WRIST SURGERY Right jan 2015   Dr. Edmonia Lynch    There were no vitals filed for this visit.  Subjective Assessment - 03/17/19 1438    Subjective   S: I've been a bad boy, I haven't done any exercises.    Currently in Pain?  Yes    Pain Score  6  Pain Location  Wrist    Pain Orientation  Left    Pain Descriptors / Indicators  Aching;Sore    Pain Type  Acute pain    Pain Radiating Towards  forearm    Pain Onset  More than a month ago    Pain Frequency  Constant    Aggravating Factors   playing guitar    Pain Relieving Factors  rest    Effect of Pain on Daily Activities  min effect on ADLs         [facility] OT Assessment - 03/17/19 1438      Assessment   Medical Diagnosis  Left Wrist Pain       Precautions   Precautions  None      Observation/Other Assessments   Quick DASH   50   38.64 previous     Coordination   Left 9 Hole Peg Test  unable to assess due to equipment unavailable   41.96" previous     Palpation   Palpation comment  moderate fascial restrictions in volar and dorsal forearm and wrist area       AROM   AROM Assessment Site  Forearm;Wrist    Right/Left Forearm  Left    Left Forearm Pronation  75 Degrees   60 previous   Left Forearm Supination  57 Degrees   60 previous   Right/Left Wrist  Left    Left Wrist Extension  45 Degrees   24 previous   Left Wrist Flexion  30 Degrees   45 previous   Left Wrist Radial Deviation  20 Degrees   15 previous   Left Wrist Ulnar Deviation  15 Degrees   8 previous     PROM   Overall PROM   Deficits;Due to pain    Overall PROM Comments  75% range with pain      Strength   Strength Assessment Site  Forearm;Wrist    Right/Left Forearm  Left    Left Forearm Pronation  4+/5   same as previous   Left Forearm Supination  4+/5   same as previous   Right/Left Wrist  Left    Left Wrist Flexion  4+/5   same as previous   Left Wrist Extension  4+/5   same as previous   Left Wrist Radial Deviation  4+/5   same as previous   Left Wrist Ulnar Deviation  4+/5   same as previous     Hand Function   Left Hand Grip (lbs)  42   30 previous   Left Hand Lateral Pinch  15 lbs   same as previous   Left 3 point pinch  4 lbs   same as previous        Katina Dung - 03/17/19 1456    Open a tight or new jar  Severe difficulty    Do heavy household chores (wash walls, wash floors)  Moderate difficulty    Carry a shopping bag or briefcase  Moderate difficulty    Wash your back  Moderate difficulty    Use a knife to cut food  Moderate difficulty    Recreational activities in which you take some force or impact through your arm, shoulder, or hand (golf, hammering, tennis)  Severe difficulty    During the past week, to what extent has your arm, shoulder or hand problem interfered with your normal social activities with family, friends, neighbors, or groups?  Not at all    During the past week,  to what extent has your arm, shoulder or hand problem limited your work or other regular daily activities  Quite a bit    Arm, shoulder, or hand pain.  Severe     Tingling (pins and needles) in your arm, shoulder, or hand  Mild    Difficulty Sleeping  Mild difficulty    DASH Score  50 %           OT Treatments/Exercises (OP) - 03/17/19 1439      ADLs   ADL Comments  Extensive education provided on use of left wrist during the day, taking breaks when playing guitar as pain increases with longer guitar sessions. Also educated pt on need for either attending therapy or completing HEP to which pt verbalized understanding.       Exercises   Exercises  Wrist;Elbow      Elbow Exercises   Forearm Supination  AROM;10 reps    Forearm Pronation  AROM;10 reps      Wrist Exercises   Wrist Flexion  PROM;AROM;10 reps    Wrist Extension  PROM;AROM;10 reps    Wrist Radial Deviation  PROM;AROM;10 reps    Wrist Ulnar Deviation  PROM;AROM;10 reps               OT Short Term Goals - 03/17/19 1616      OT SHORT TERM GOAL #1   Title  Patient will be educated on a HEP for improved left upper extremity use.    Time  4    Period  Weeks    Status  Not Met    Target Date  03/22/19      OT SHORT TERM GOAL #2   Title  Patient will improve left wrist p/rom to WNL for improved ability to maintain position on steering wheel.    Time  4    Period  Weeks    Status  Not Met      OT SHORT TERM GOAL #3   Title  Patient will improve left grip strength by 5# and pinch strength by 2# for improved ability to open containers.    Time  4    Period  Weeks    Status  Partially Met      OT SHORT TERM GOAL #4   Title  Patient will improve fine motor coordination as evidence by decreasing completion time on nine hole peg test by 5 seconds for improved ability to fasten buttons.    Time  4    Period  Weeks    Status  Not Met      OT SHORT TERM GOAL #5   Title  Patient will decrease pain in his left wrist to 3/10 or better when opening doors.    Time  4    Period  Weeks    Status  Not Met        OT Long Term Goals - 03/17/19 1616      OT LONG TERM  GOAL #1   Title  Patient will return to PLOF using his left arm with all functional activities without incident.    Time  8    Period  Weeks    Status  Not Met      OT LONG TERM GOAL #2   Title  Patient will improve left wrist and forearm a/rom to WNL for improved ability to play the guitar.    Time  8    Period  Weeks    Status  Not  Met      OT LONG TERM GOAL #3   Title  Patient will improve left forearm and wrist strength to 5/5 for improved ability to pick up gardening tools.    Time  8    Period  Weeks    Status  Not Met      OT LONG TERM GOAL #4   Title  Patient will improve left grip strength to 50 pounds or more and pinch strength by 10 pounds for improved ability to maintain grasp on utensils and tools.    Time  8    Period  Weeks    Status  Not Met      OT LONG TERM GOAL #5   Title  Patient will improve fine motor coordiation as evidence by completing nine hole peg test in 35" or less for improved ability to manipulate change.    Time  8    Period  Weeks    Status  Not Met      OT LONG TERM GOAL #6   Title  Patient will decrease pain in his left wrist to 2/10 or better when turning the steering wheel.    Time  8    Period  Weeks    Status  Not Met            Plan - 03/17/19 1617    Clinical Impression Statement  A: Pt returns for first visit since evaluation on 02/22/2019, recently had an MRI of the wrist. Pt reports he has completed exercises occasionally and has not had any changes to functioning. Pt reports cancelling appts due to poor sleep and high copay, discussed options with pt and educated pt that condition will not improve without therapy attendance or HEP completion. Pt verbalized understanding and would like to be discharged with HEP, understands that he will need a new referral to return to therapy in the future. Pt has not partially met one goal for grip strength. ROM has maintained or decreased and pain has not changed. Reviewed exercises on HEP for  home completion.    Body Structure / Function / Physical Skills  ADL;Strength;Pain;Dexterity;UE functional use;IADL;ROM;Fascial restriction;Flexibility;FMC;Muscle spasms    Plan  P: Discharge pt       Patient will benefit from skilled therapeutic intervention in order to improve the following deficits and impairments:   Body Structure / Function / Physical Skills: ADL, Strength, Pain, Dexterity, UE functional use, IADL, ROM, Fascial restriction, Flexibility, FMC, Muscle spasms       Visit Diagnosis: 1. Pain in left wrist   2. Stiffness of left wrist, not elsewhere classified   3. Other lack of coordination   4. Other symptoms and signs involving the musculoskeletal system       Problem List Patient Active Problem List   Diagnosis Date Noted  . Need for vaccination with 13-polyvalent pneumococcal conjugate vaccine 03/01/2019  . Left forearm pain 08/15/2017  . Chest wall pain 03/28/2017  . History of colonic polyps 11/13/2015  . Leukopenia 11/06/2015  . Thrombocytopenia ([place]) 11/06/2015  . Back muscle spasm 10/21/2015  . HCV (hepatitis C virus) 06/16/2015  . MDD (major depressive disorder) 01/21/2015  . Seasonal allergies 12/27/2014  . Dilation of biliary tract 07/14/2012  . Epigastric pain 07/14/2012  . RUQ pain 07/14/2012  . Trigger middle finger of left hand 06/20/2012  . Vitamin D deficiency 06/20/2012  . Chronic pain syndrome 12/24/2011  . Narcotic addiction ([place]) 06/10/2011  . Essential hypertension 09/06/2010  . Prediabetes  04/10/2010  . INSOMNIA 11/07/2009  . Anxiety and depression 09/04/2009  . Chronic hepatitis C without hepatic coma ([place]) 01/04/2009  . IRRITABLE BOWEL SYNDROME 01/04/2009  . GERD 01/03/2009   Guadelupe Sabin, OTR/L  [phone] 03/17/2019, 4:25 PM  [facility] [address] [place], [place], [address] Phone: [phone]   Fax:  [phone]  Name: Marc [name] MRN: [id number] Date of Birth:  1955/12/28   OCCUPATIONAL THERAPY DISCHARGE SUMMARY  Visits from Start of Care: 2  Current functional level related to goals / functional outcomes: See above.  Pt has attended one therapy session (today) since evaluation on 02/22/2019 and has been non-compliant with HEP. Reports financial issues with attendance as well. Pt agreeable to discharge with HEP.    Remaining deficits: Same as on evaluation-ROM, strength limitations, pain, decreased functional use of left wrist and hand   Education / Equipment: HEP for wrist/forearm ROM, grip and pinch strengthening with red theraputty Plan: Patient agrees to discharge.  Patient goals were not met. Patient is being discharged due to lack of progress.  ?????

## 2019-03-21 ENCOUNTER — Ambulatory Visit (HOSPITAL_COMMUNITY): Payer: Medicare HMO

## 2019-03-23 ENCOUNTER — Ambulatory Visit (HOSPITAL_COMMUNITY): Payer: Medicare HMO

## 2019-03-27 ENCOUNTER — Ambulatory Visit (HOSPITAL_COMMUNITY): Payer: Medicare HMO

## 2019-03-29 ENCOUNTER — Encounter (HOSPITAL_COMMUNITY): Payer: Medicare HMO | Admitting: Occupational Therapy

## 2019-03-31 ENCOUNTER — Encounter

## 2019-04-03 ENCOUNTER — Encounter (HOSPITAL_COMMUNITY): Payer: Medicare HMO | Admitting: Specialist

## 2019-04-04 ENCOUNTER — Encounter: Payer: Medicare HMO | Admitting: Family Medicine

## 2019-04-05 ENCOUNTER — Encounter (HOSPITAL_COMMUNITY): Payer: Medicare HMO | Admitting: Occupational Therapy

## 2019-04-07 ENCOUNTER — Other Ambulatory Visit (HOSPITAL_COMMUNITY): Payer: Self-pay | Admitting: Family Medicine

## 2019-04-11 ENCOUNTER — Ambulatory Visit (INDEPENDENT_AMBULATORY_CARE_PROVIDER_SITE_OTHER): Payer: Medicare HMO | Admitting: Family Medicine

## 2019-04-11 ENCOUNTER — Other Ambulatory Visit: Payer: Self-pay

## 2019-04-11 ENCOUNTER — Encounter: Payer: Self-pay | Admitting: Family Medicine

## 2019-04-11 VITALS — BP 142/80 | HR 80 | Temp 98.6°F | Resp 12 | Ht 70.0 in | Wt 174.0 lb

## 2019-04-11 DIAGNOSIS — Z23 Encounter for immunization: Secondary | ICD-10-CM

## 2019-04-11 DIAGNOSIS — H6122 Impacted cerumen, left ear: Secondary | ICD-10-CM

## 2019-04-11 DIAGNOSIS — F4310 Post-traumatic stress disorder, unspecified: Secondary | ICD-10-CM

## 2019-04-11 DIAGNOSIS — Z0000 Encounter for general adult medical examination without abnormal findings: Secondary | ICD-10-CM

## 2019-04-11 DIAGNOSIS — I10 Essential (primary) hypertension: Secondary | ICD-10-CM

## 2019-04-11 DIAGNOSIS — G894 Chronic pain syndrome: Secondary | ICD-10-CM

## 2019-04-11 DIAGNOSIS — Z1211 Encounter for screening for malignant neoplasm of colon: Secondary | ICD-10-CM

## 2019-04-11 MED ORDER — PRAZOSIN HCL 5 MG PO CAPS: 5.0000 mg | ORAL_CAPSULE | Freq: Every day | ORAL | 1 refills | Status: DC

## 2019-04-11 NOTE — Progress Notes (Signed)
Marc [name] is a 63 y.o. male who presents for annual wellness visit and follow-up on chronic medical conditions.   Immunization History  Administered Date(s) Administered   Fluad Quad(high Dose 65+) 04/11/2019   Influenza Split 06/20/2012   Influenza,inj,Quad PF,6+ Mos 04/24/2013, 08/28/2014, 06/13/2015, 08/11/2017   Pneumococcal Conjugate-13 02/28/2019   Tdap 12/24/2011   Zoster 10/04/2013   Last colonoscopy: ordered today-overdue Last PSA: previously ordered-has not gotten yet Dentist: Yes Ophtho: Needs check up, needs new glasses Exercise: too much pain in ankle  End of Life Discussion:  Patient does not have a living will and medical power of attorney  Review of Systems  Constitutional: Negative.  Negative for chills and fever.  HENT: Positive for ear pain.   Eyes: Positive for visual disturbance.  Respiratory: Negative.  Negative for cough and shortness of breath.   Cardiovascular: Negative for chest pain, palpitations and leg swelling.  Gastrointestinal: Negative.   Endocrine: Negative.   Genitourinary: Negative.   Musculoskeletal: Negative.   Skin: Negative.   Neurological: Negative for dizziness and headaches.  Psychiatric/Behavioral: Positive for sleep disturbance.   PHYSICAL EXAM:  BP (!) 142/80    Pulse 80    Temp 98.6 F (37 C) (Temporal)    Resp 12    Ht 5\' 10"  (1.778 m)    Wt 174 lb 0.6 oz (78.9 kg)    SpO2 97%    BMI 24.97 kg/m   Physical Exam Vitals signs and nursing note reviewed.  Constitutional:      Appearance: Normal appearance. He is well-developed, well-groomed and normal weight.  HENT:     Head: Normocephalic and atraumatic.     Right Ear: Tympanic membrane, ear canal and external ear normal. There is impacted cerumen.     Left Ear: Tympanic membrane, ear canal and external ear normal.     Nose: Nose normal.     Mouth/Throat:     Mouth: Mucous membranes are moist.     Pharynx: Oropharynx is clear.  Eyes:     General:      Right eye: No discharge.        Left eye: No discharge.     Extraocular Movements: Extraocular movements intact.     Conjunctiva/sclera: Conjunctivae normal.     Pupils: Pupils are equal, round, and reactive to light.  Neck:     Musculoskeletal: Normal range of motion and neck supple.  Cardiovascular:     Rate and Rhythm: Normal rate and regular rhythm.     Pulses: Normal pulses.     Heart sounds: Normal heart sounds.  Pulmonary:     Effort: Pulmonary effort is normal.     Breath sounds: Normal breath sounds.  Abdominal:     General: Bowel sounds are normal. There is no distension.     Palpations: Abdomen is soft.  Musculoskeletal: Normal range of motion.     Right lower leg: No edema.     Left lower leg: No edema.  Skin:    General: Skin is warm and dry.     Capillary Refill: Capillary refill takes less than 2 seconds.  Neurological:     General: No focal deficit present.     Mental Status: He is alert and oriented to person, place, and time.     Cranial Nerves: Cranial nerves are intact.     Sensory: Sensation is intact.     Motor: Motor function is intact.     Coordination: Coordination is intact.  Gait: Gait is intact.     Deep Tendon Reflexes: Reflexes are normal and symmetric.  Psychiatric:        Attention and Perception: Attention normal.        Mood and Affect: Mood and affect normal.        Speech: Speech normal.        Behavior: Behavior normal. Behavior is cooperative.        Thought Content: Thought content normal.        Cognition and Memory: Cognition and memory normal.        Judgment: Judgment normal.     ASSESSMENT/PLAN:   1. Annual physical exam As documented above Discussed PSA screening (risks/benefits), recommended at least 30 minutes of aerobic activity at least 5 days/week; proper sunscreen use reviewed; healthy diet and alcohol recommendations (less than or equal to 2 drinks/day) reviewed; regular seatbelt use; changing batteries in smoke  detectors. Immunization recommendations discussed-flu provided.  Colonoscopy recommendations reviewed-ordered.  2. Essential hypertension Marc [name] is encouraged to maintain a well balanced diet that is low in salt. Stable, not very controlled, continue current medication regimen.  No refills needed.  He is also reminded that exercise is beneficial for heart health and control of  Blood pressure. 30-60 minutes daily is recommended-walking was suggested.  3. PTSD (post-traumatic stress disorder) Stable, needs refill. Seeing [facility]  - prazosin (MINIPRESS) 5 MG capsule; Take 1 capsule (5 mg total) by mouth at bedtime.  Dispense: 30 capsule; Refill: 1  4. Special screening for malignant neoplasms, colon Overdue, ordered today  - Ambulatory referral to Gastroenterology  5. Chronic pain syndrome Reports several joints of pain and feels the Ortho provider did not address his pain well. Substance abuse hx no pain medication provided today. Encourage mobility and rest as needed  6. Impacted cerumen of left ear Flushed both ears today.  Recommend Debrox OTC for maintenance care at home  Medicare Attestation I have personally reviewed: The patient's medical and social history Their use of alcohol, tobacco or illicit drugs Their current medications and supplements The patient's functional ability including ADLs,fall risks, home safety risks, cognitive, and hearing and visual impairment Diet and physical activities Evidence for depression or mood disorders  The patient's weight, height, BMI, and visual acuity have been recorded in the chart.  I have made referrals, counseling, and provided education to the patient based on review of the above and I have provided the patient with a written personalized care plan for preventive services.     Perlie Mayo, NP   04/11/2019

## 2019-04-11 NOTE — Patient Instructions (Signed)
Thank you for coming into the office today. I appreciate the opportunity to provide you with the care for your health and wellness. Today we discussed:   Follow Up: 4 months with Dr Moshe Cipro  Please get your labs this week.  Flu vaccine today: Tylenol if arm gets sore.  Referral for Colonoscopy made today.  GENERAL RECOMMENDATIONS FOR GOOD HEALTH:  Supplements:   Take a daily baby Aspirin 81mg  at bedtime for heart health unless you have a history of gastrointestinal bleed, allergy to aspirin, or are already taking higher dose Aspirin or other antiplatelet or blood thinner medication.    Consume 1200 mg of Calcium daily through dietary calcium or supplement if you are male age 44 or older, or men 20 and older.   Men aged 67-70 should consume 1000 mg of Calcium daily.  Take 600 IU of Vitamin D daily.  Take 800 IU of Calcium daily if you are older than age 80.   Take a general multivitamin daily.   Healthy diet: Eat a variety of foods, including fruits, vegetables, vegetable protein such as beans, lentils, tofu, and grains, such as rice.  Limit meat or animal protein, but if you eat meat, choose leans cuts such as chicken, fish, or [place].  Drink plenty of water daily.  Decrease saturated fat in the diet, avoid lots of red meat, processed foods, sweets, fast foods, and fried foods.  Limit salt and caffeine intake.  Exercise: Aerobic exercise helps maintain good heart health. Weight bearing exercise helps keep bones and muscles working strong.  We recommend at least 30-40 minutes of exercise most days of the week.   Fall prevention: Falls are the leading cause of injuries, accidents, and accidental deaths in people over the age of 53. Falling is a real threat to your ability to live on your own.  Causes include poor eyesight or poor hearing, illness, poor lighting, throw rugs, clutter in your home, and medication side effects causing dizziness or balance problems.  Such medications  can include medications for depression, sleep problems, high blood pressure, diabetes, and heart conditions.   PREVENTION  Be sure your home is as safe as possible. Here are some tips:  Wear shoes with non-skid soles (not house slippers).   Be sure your home and outside area are well lit.   Use night lights throughout your house, including hallways and stairways.   Remove clutter and clean up spills on floors and walkways.   Remove throw rugs or fasten them to the floor with carpet tape. Tack down carpet edges.   Do not place electrical cords across pathways.   Install grab bars in your bathtub, shower, and toilet area. Towel bars should not be used as a grab bar.   Install handrails on both sides of stairways.   Do not climb on stools or stepladders. Get someone else to help with jobs that require climbing.   Do not wax your floors at all, or use a non-skid wax.   Repair uneven or unsafe sidewalks, walkways or stairs.   Keep frequently used items within reach.   Be aware of pets so you do not trip.  Get regular check-ups from your doctor, and take good care of yourself:  Have your eyes checked every year for vision changes, cataracts, glaucoma, and other eye problems. Wear eyeglasses as directed.   Have your hearing checked every 2 years, or anytime you or others think that you cannot hear well. Use hearing aids as  directed.   See your caregiver if you have foot pain or corns. Sore feet can contribute to falls.   Let your caregiver know if a medicine is making you feel dizzy or making you lose your balance.   Use a cane, walker, or wheelchair as directed. Use walker or wheelchair brakes when getting in and out.   When you get up from bed, sit on the side of the bed for 1 to 2 minutes before you stand up. This will give your blood pressure time to adjust, and you will feel less dizzy.   If you need to go to the bathroom often, consider using a bedside commode.  Disease  prevention:  If you smoke or chew tobacco, find out from your caregiver how to quit. It can literally save your life, no matter how long you have been a tobacco user. If you do not use tobacco, never begin. Medicare does cover some smoking cessation counseling.  Maintain a healthy diet and normal weight. Increased weight leads to problems with blood pressure and diabetes. We check your height, weight, and BMI as part of your yearly visit.  The Body Mass Index or BMI is a way of measuring how much of your body is fat. Having a BMI above 27 increases the risk of heart disease, diabetes, hypertension, stroke and other problems related to obesity. Your caregiver can help determine your BMI and based on it develop an exercise and dietary program to help you achieve or maintain this important measurement at a healthful level.  High blood pressure causes heart and blood vessel problems.  Persistent high blood pressure should be treated with medicine if weight loss and exercise do not work.  We check your blood pressure as part of your yearly visit.  Avoid drinking alcohol in excess (more than two drinks per day).  Avoid use of street drugs. Do not share needles with anyone. Ask for professional help if you need assistance or instructions on stopping the use of alcohol, cigarettes, and/or drugs.  Brush your teeth twice a day with fluoride toothpaste, and floss once a day. Good oral hygiene prevents tooth decay and gum disease. The problems can be painful, unattractive, and can cause other health problems. Visit your dentist for a routine oral and dental checkup and preventive care every 6-12 months.   See your eye doctor yearly for routine screening for things like glaucoma.  Look at your skin regularly.  Use a mirror to look at your back. Notify your caregivers of changes in moles, especially if there are changes in shapes, colors, a size larger than a pencil eraser, an irregular border, or development of  new moles.  Safety:  Use seatbelts 100% of the time, whether driving or as a passenger.  Use safety devices such as hearing protection if you work in environments with loud noise or significant background noise.  Use safety glasses when doing any work that could send debris in to the eyes.  Use a helmet if you ride a bike or motorcycle.  Use appropriate safety gear for contact sports.  Talk to your caregiver about gun safety.  Use sunscreen with a SPF (or skin protection factor) of 15 or greater.  Lighter skinned people are at a greater risk of skin cancer. Dont forget to also wear sunglasses in order to protect your eyes from too much damaging sunlight. Damaging sunlight can accelerate cataract formation.   If you have multiple sexual partners, or if you are not in  a monogamous relationship, practice safe sex. Use condoms. Condoms are used to help reduce the spread of sexually transmitted infections (or STIs).  Consider an HIV test if you have never been tested.  Consider routine screening for STIs if you have multiple sexual partners.   Keep carbon monoxide and smoke detectors in your home functioning at all times. Change the batteries every 6 months or use a model that plugs into the wall or is hard wired in.   END OF LIFE PLANNING/ADVANCED DIRECTIVES Advance health-care planning is deciding the kind of care you want at the end of life. While alert competent adults are able to exercise their rights to make health care and financial decisions, problems arise when an individual becomes unconscious, incapacitated, or otherwise unable to communicate or make such decisions. Advance health care directives are the legal documents in which you give written instructions about your choices limited, aggressive or palliative care if, in the future, you cannot speak for yourself.  Advanced directives include the following: [place] allows you to appoint someone to act as your health care  agent to make health care decisions for you should it be determined by your health care provider that you are no longer able to make these decisions for yourself.  A Living Will is a legal document in which you can declare that under certain conditions you desire your life not be prolonged by extraordinary or artificial means during your last illness or when you are near death. We can provide you with sample advanced directives, you can get an attorney to prepare these for you, or you can visit [place] Secretary of States website for additional information and resources at [email or web address]  Further, I recommend you have an attorney prepare a Will and Durable Power of Attorney if you havent done so already.  Please get [name] a copy of your health care Advanced Directives.  PREVENTATIV E CARE RECOMMENDATIONS:  Vaccinations: We recommend the following vaccinations as part of your preventative care:  Pneumococcal vaccine is recommended to protect against certain types of pneumonia.  This is normally recommended for adults age 37 or older once, or up to every 5 years for those at high risk.  The vaccine is also recommended for adults younger than 65 years old with certain underlying conditions that make them high risk for pneumonia.  Influenza vaccine is recommended to protect against seasonal influenza or the flu. Influenza is a serious disease that can lead to hospitalization and sometimes even death. Traditional flu vaccines (called trivalent vaccines) are made to protect against three flu viruses; an influenza A (H1N1) virus, an influenza A (H3N2) virus, and an influenza B virus. In addition, there are flu vaccines made to protect against four flu viruses (called quadrivalent vaccines). These vaccines protect against the same viruses as the trivalent vaccine and an additional B virus.  We recommend the high dose influenza vaccine to those 65 years and older.  Td or Tdap vaccine to  protect against Tetanus, diphtheria and pertussis which can be very serious.  These diseases are caused by bacteria.  Diphtheria and pertussis are spread from person to person through coughing or sneezing.  Tetanus enters the body through cuts, scratches, or wounds.  Tetanus (Lockjaw) causes painful muscle tightening and stiffness, usually all over the body.  Diphtheria can cause a thick coating to form in the back of the throat.  It can lead to breathing problems, paralysis, heart failure, and death.  Pertussis (Whooping  Cough) causes severe coughing spells, which can cause difficulty breathing, vomiting and disturbed sleep.  Td or Tdap is usually given every 10 years.  Shingles vaccine to protect against Varicella Zoster if you are older than age 66, or younger than 65 years old with certain underlying illness.   Cancer Screening: Most routine colon cancer screening begins at the age of 68.  Subsequent colonoscopies are performed either every 5-10 years for normal screening, or every 2-5 years for higher risks patients, up until age 55 years of age. Annual screening is done with easy to use take-home tests to check for hidden blood in the stool called hemoccult tests.  Sigmoidoscopy or colonoscopy can detect the earliest forms of colon cancer and is life saving. These tests use a small camera at the end of a tube to directly examine the colon.   Prostate cancer screening usually begins at age 46 years old, or younger age for those with higher risk.  Those at higher risk include African-Americans or having a family history of prostate cancer. There are two types of tests for prostate cancer - Prostate-specific antigen (PSA) testing. Recent studies raise questions about prostate cancer using PSA and you should discuss this with your caregiver.  The other type of test is the digital rectal exam (in which your doctors lubricated and gloved finger feels for enlargement of the prostate through the anus).  We  routinely stop testing at age 33 years of age.  Cardiovascular Screening: Fat and cholesterol leaves deposits in your arteries that can block them. This causes heart disease and vessel disease elsewhere in your body.  If your cholesterol is found to be high, or if you have heart disease or certain other medical conditions, then you may need to have your cholesterol monitored frequently and be treated with medication. Cardiovascular screening in the form of lab tests for cholesterol, HDL and triglycerides can be done every 5 years.  A screening electrocardiogram can be done as part of the Welcome to Medicare physical.  Diabetes Screening: Diabetes screening can be done at least every 3 years for those with risk factors,  or every 6-73months for prediabetic patients.  Screening includes fasting blood sugar test or glucose tolerance test.  Risk factors include hypertension, dyslipidemia, obesity, previously abnormal glucose tests, family history of diabetes, age 79 years or older, and history of gestations diabetes.   AAA (abdominal aortic aneurysm) Screening: Medicare allows for a one time ultrasound to screen for abdominal aortic aneurysm if done as a referral as part of the Welcome to Medicare exam.  Men eligible for this screening include those men between age 72-50 years of age who have smoked at least 100 cigarettes in his lifetime and/or has a family history of AAA.  [place] YOUR HANDS WELL AND FREQUENTLY. AVOID TOUCHING YOUR FACE, UNLESS YOUR HANDS ARE FRESHLY WASHED.   GET FRESH AIR DAILY. STAY HYDRATED WITH WATER.   It was a pleasure to see you and I look forward to continuing to work together on your health and well-being. Please do not hesitate to call the office if you need care or have questions about your care.  Have a wonderful day and week.  With Gratitude,  Cherly Beach, DNP, AGNP-BC

## 2019-04-12 ENCOUNTER — Encounter (INDEPENDENT_AMBULATORY_CARE_PROVIDER_SITE_OTHER): Payer: Self-pay | Admitting: *Deleted

## 2019-04-19 ENCOUNTER — Ambulatory Visit (INDEPENDENT_AMBULATORY_CARE_PROVIDER_SITE_OTHER): Payer: Medicare HMO | Admitting: Psychiatry

## 2019-04-19 ENCOUNTER — Encounter (HOSPITAL_COMMUNITY): Payer: Self-pay | Admitting: Psychiatry

## 2019-04-19 ENCOUNTER — Other Ambulatory Visit: Payer: Self-pay

## 2019-04-19 DIAGNOSIS — F331 Major depressive disorder, recurrent, moderate: Secondary | ICD-10-CM

## 2019-04-19 DIAGNOSIS — F4310 Post-traumatic stress disorder, unspecified: Secondary | ICD-10-CM

## 2019-04-19 MED ORDER — PRAZOSIN HCL 5 MG PO CAPS: 5.0000 mg | ORAL_CAPSULE | Freq: Every day | ORAL | 1 refills | Status: DC

## 2019-04-19 MED ORDER — MIRTAZAPINE 15 MG PO TABS: 15.0000 mg | ORAL_TABLET | Freq: Every day | ORAL | 2 refills | Status: DC

## 2019-04-19 MED ORDER — QUETIAPINE FUMARATE 50 MG PO TABS: 50.0000 mg | ORAL_TABLET | Freq: Four times a day (QID) | ORAL | 2 refills | Status: DC

## 2019-04-19 NOTE — Progress Notes (Signed)
Virtual Visit via Telephone Note  I connected with Marc [name] on 04/19/19 at 10:00 AM EDT by telephone and verified that I am speaking with the correct person using two identifiers.   I discussed the limitations, risks, security and privacy concerns of performing an evaluation and management service by telephone and the availability of in person appointments. I also discussed with the patient that there may be a patient responsible charge related to this service. The patient expressed understanding and agreed to proceed.    I discussed the assessment and treatment plan with the patient. The patient was provided an opportunity to ask questions and all were answered. The patient agreed with the plan and demonstrated an understanding of the instructions.   The patient was advised to call back or seek an in-person evaluation if the symptoms worsen or if the condition fails to improve as anticipated.  I provided 15 minutes of non-face-to-face time during this encounter.   [name], MD  [facility] MD/PA/NP OP Progress Note  04/19/2019 10:19 AM Marc [name]  MRN:  [id number]  Chief Complaint:  Chief Complaint    Depression; Anxiety; Follow-up     HPI: This patient is a 63 year old divorced white male lives alone in [place].  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  He has 1 son who lives in [place].  The patient returns after 4 weeks.  He states that he is doing a little bit better.  He is attending an [place] meeting every night.  He sounds as if he is less depressed.  He still has some nightmares although his primary provider increase the prazosin to 5 mg.  He states he is often reliving things from his past previous relationships and marriages.  Unfortunately he missed his counseling appointment because he "slept through it."  He is not always sleeping that well at night due to chronic pain.  He takes ibuprofen but only at a 200 mg dosage and I explained that  he probably needs to take 800 mg twice a day.  He states he is less depressed and denies suicidal ideation and and enjoys going to the meetings and connecting with others.  His nurse practitioner primary care also changed to Seroquel from Exar 150 mg at bedtime to 50 mg 3-4 times daily.  He thought the XR dosage at bedtime was too much.  However since his sleep is somewhat interrupted I suggested he take 50 mg twice daily and 100 mg at bedtime. Visit Diagnosis:    ICD-10-CM   1. Major depressive disorder, recurrent, moderate (HCC)  F33.1   2. PTSD (post-traumatic stress disorder)  F43.10 prazosin (MINIPRESS) 5 MG capsule    Past Psychiatric History: Several hospitalizations for alcohol detox, the last one being 4 months ago  Past Medical History:  Past Medical History:  Diagnosis Date  . Acute encephalopathy 12/29/2017   Hospitalized 5/22 to 5/23 with acute encephalopathy, unclear etiology  . Allergy   . Anxiety   . Arthritis   . Back problem   . Depression   . GERD (gastroesophageal reflux disease)   . Hepatitis C 1979  . Hepatitis C reactive    LIVER BX 2008-CHRONIC ACTIVE HEPATITIS  . HTN (hypertension)   . Hx of adenomatous colonic polyps 2008   due for surveillance 2017  . Hypercholesterolemia   . IBS (irritable bowel syndrome)   . Knee pain   . Leukopenia 11/06/2015  . Normal cardiac stress test 05/25/2011  [facility]  . Normal echocardiogram 05/25/2011   EF 55%, mild TR  . Substance abuse ([place])    narcotic addiction  . Thrombocytopenia ([place]) 11/06/2015    Past Surgical History:  Procedure Laterality Date  . APPENDECTOMY    . CARPAL TUNNEL RELEASE     rt  . COLONOSCOPY  2008 SLF ARS D100 V8 PHEN 12.5   2 SIMPLE ADENOMAS (< 1 CM)  . ESOPHAGOGASTRODUODENOSCOPY  04/26/2012   Dr. Oneida Alar: Non-erosive gastritis (inflammation) was found in the gastric antrum but no H.pylori; multiple biopsies (duodenal bx negative for Celiac)/ The mucosa of the esophagus  appeared normal  . EUS  09/08/2012   Dr. Ardis Hughs: CBD dilated but no stones. Query secondary to Sphincter of Oddi stenosis, ?dysfunction, but clinically without symptoms  . HARDWARE REMOVAL Right 02/12/2014   Procedure: HARDWARE REMOVAL;  Surgeon: Jolyn Nap, MD;  Location: [place];  Service: Orthopedics;  Laterality: Right;  . KNEE ARTHROSCOPY    . Neurostimulator implant    . right ring finger    . spinal stenosis, had screws put in neck  04/11/2009   DR KRITZER  . TONSILLECTOMY    . ULNA OSTEOTOMY Right 02/12/2014   Procedure: RIGHT ULNAR SHORTENING AND OSTEOTOMY ;  Surgeon: Jolyn Nap, MD;  Location: [place];  Service: Orthopedics;  Laterality: Right;  . ULNAR NERVE TRANSPOSITION    . UPPER GASTROINTESTINAL ENDOSCOPY  2008 SLF ABD PAIN WEIGHT LOSS d100 v8 phen 12.5   NL  . WRIST SURGERY Right jan 2015   Dr. Edmonia Lynch    Family Psychiatric History: See below  Family History:  Family History  Problem Relation Age of Onset  . Bladder Cancer Mother        rare form   . Cancer Sister        Metastatic abdominal  . Emotional abuse Sister   . Stroke Sister   . Emotional abuse Sister   . Heart failure Father   . Stroke Father   . Alcohol abuse Father   . Dementia Paternal Aunt   . Alcohol abuse Paternal Uncle   . Dementia Paternal Uncle   . Dementia Paternal Grandmother   . Stroke Paternal Grandmother   . ADD / ADHD Cousin   . Bipolar disorder Cousin   . Anxiety disorder Cousin   . Depression Cousin        Committed suicide  . Alcohol abuse Cousin   . OCD Cousin   . Paranoid behavior Cousin   . Brain cancer Maternal Grandfather   . Heart defect Other        FAMILY HX  . Colon cancer Maternal Uncle   . Colon polyps Neg Hx   . Drug abuse Neg Hx   . Schizophrenia Neg Hx   . Seizures Neg Hx   . Sexual abuse Neg Hx   . Physical abuse Neg Hx     Social History:  Social History   Socioeconomic History  . Marital status:  Divorced    Spouse name: Not on file  . Number of children: Not on file  . Years of education: Not on file  . Highest education level: Not on file  Occupational History  . Not on file  Social Needs  . Financial resource strain: Not hard at all  . Food insecurity    Worry: Never true    Inability: Never true  . Transportation needs    Medical: No  Non-medical: No  Tobacco Use  . Smoking status: Former Smoker    Packs/day: 1.50    Years: 8.00    Pack years: 12.00    Types: Cigarettes    Quit date: 08/13/1986    Years since quitting: 32.7  . Smokeless tobacco: Never Used  . Tobacco comment: quit 20 + yrs ago  Substance and Sexual Activity  . Alcohol use: No    Alcohol/week: 0.0 standard drinks  . Drug use: No  . Sexual activity: Never  Lifestyle  . Physical activity    Days per week: 0 days    Minutes per session: 0 min  . Stress: Very much  Relationships  . Social connections    Talks on phone: More than three times a week    Gets together: Three times a week    Attends religious service: Never    Active member of club or organization: No    Attends meetings of clubs or organizations: Never    Relationship status: Divorced  Other Topics Concern  . Not on file  Social History Narrative  . Not on file    Allergies:  Allergies  Allergen Reactions  . Levofloxacin Nausea Only and Other (See Comments)    "Creepy" feeling, skin didn't feel right, sort of itchy.  . Ciprofloxacin     Other reaction(s): Sweating (intolerance)  . Sulfa Antibiotics     Metabolic Disorder Labs: Lab Results  Component Value Date   HGBA1C 5.5 12/30/2017   MPG 111.15 12/30/2017   MPG 111 10/22/2016   No results found for: PROLACTIN Lab Results  Component Value Date   CHOL 229 (H) 10/22/2016   TRIG 134 10/22/2016   HDL 57 10/22/2016   CHOLHDL 4.0 10/22/2016   VLDL 27 10/22/2016   LDLCALC 145 (H) 10/22/2016   LDLCALC 100 10/21/2015   Lab Results  Component Value Date   TSH  2.056 12/30/2017   TSH 1.724 10/22/2016    Therapeutic Level Labs: No results found for: LITHIUM No results found for: VALPROATE No components found for:  CBMZ  Current Medications: Current Outpatient Medications  Medication Sig Dispense Refill  . acetaminophen (TYLENOL) 500 MG tablet Take 500 mg by mouth as needed.    . Ascorbic Acid (VITAMIN C PO) Take 1 Dose by mouth daily.    Marland Kitchen aspirin EC 81 MG tablet Take 81 mg by mouth daily.    . Cholecalciferol (VITAMIN D3 PO) Take 1 Dose by mouth daily.    . cloNIDine (CATAPRES) 0.2 MG tablet TAKE (1) TABLET BY MOUTH TWICE DAILY. 60 tablet 0  . dimenhyDRINATE (DRAMAMINE) 50 MG tablet Take 50 mg by mouth every 8 (eight) hours as needed for itching.    . mirtazapine (REMERON) 15 MG tablet Take 1 tablet (15 mg total) by mouth at bedtime. 30 tablet 2  . Multiple Vitamins-Minerals (MULTIVITAMIN ADULTS 50+) TABS Take 1 tablet by mouth daily.    . naproxen (NAPROSYN) 500 MG tablet Take 1 tablet (500 mg total) by mouth 2 (two) times daily with a meal. 30 tablet 0  . olopatadine (PATANOL) 0.1 % ophthalmic solution Place 1 drop into both eyes 2 (two) times daily. 5 mL 2  . Omega-3 Fatty Acids (FISH OIL) 1000 MG CAPS Take 1 capsule by mouth daily.    . ondansetron (ZOFRAN) 4 MG tablet One tablet once daily as needed, for uncontrolled nausea 7 tablet 0  . prazosin (MINIPRESS) 5 MG capsule Take 1 capsule (5 mg total) by mouth  at bedtime. 30 capsule 1  . QUEtiapine (SEROQUEL) 50 MG tablet Take 1 tablet (50 mg total) by mouth 4 (four) times daily. 120 tablet 2   No current facility-administered medications for this visit.      Musculoskeletal: Strength & Muscle Tone: decreased Gait & Station: normal Patient leans: N/A  Psychiatric Specialty Exam: Review of Systems  Constitutional: Positive for malaise/fatigue.  Musculoskeletal: Positive for joint pain and myalgias.  All other systems reviewed and are negative.   There were no vitals taken for this  visit.There is no height or weight on file to calculate BMI.  General Appearance: NA  Eye Contact:  NA  Speech:  Clear and Coherent  Volume:  Normal  Mood:  Euthymic  Affect:  NA  Thought Process:  Goal Directed  Orientation:  Full (Time, Place, and Person)  Thought Content: Rumination   Suicidal Thoughts:  No  Homicidal Thoughts:  No  Memory:  Immediate;   Good Recent;   Good Remote;   Good  Judgement:  Fair  Insight:  Fair  Psychomotor Activity:  Decreased  Concentration:  Concentration: Good and Attention Span: Good  Recall:  Good  Fund of Knowledge: Good  Language: Good  Akathisia:  No  Handed:  Right  AIMS (if indicated): not done  Assets:  Communication Skills Desire for Improvement Resilience Social Support Talents/Skills  ADL's:  Intact  Cognition: WNL  Sleep:  Fair   Screenings: PHQ2-9     Office Visit from 04/11/2019 in [facility] Office Visit from 02/28/2019 in [facility] from 02/08/2019 in [facility] Office Visit from 01/19/2019 in [facility] Office Visit from 03/15/2018 in [facility]  PHQ-2 Total Score  3  4  3  1  4   PHQ-9 Total Score  14  20  19   -  11       Assessment and Plan:  This patient is a 63 year old male with a long history of depression and alcohol abuse.  He seems to be doing a little bit better in terms of mood so we will continue mirtazapine 15 mg at bedtime for depression.  We will continue prazosin 5 mg at bedtime for nightmares and Seroquel 50 mg 4 times daily although he could take 100 mg of this at bedtime to help with sleep.  We will try to reschedule his counseling and he will return to see me in 2 months.  [name], MD 04/19/2019, 10:19 AM

## 2019-05-03 DIAGNOSIS — Z125 Encounter for screening for malignant neoplasm of prostate: Secondary | ICD-10-CM | POA: Diagnosis not present

## 2019-05-03 DIAGNOSIS — I10 Essential (primary) hypertension: Secondary | ICD-10-CM | POA: Diagnosis not present

## 2019-05-03 DIAGNOSIS — Z79899 Other long term (current) drug therapy: Secondary | ICD-10-CM | POA: Diagnosis not present

## 2019-05-04 LAB — CBC
HCT: 40.4 % (ref 38.5–50.0)
Hemoglobin: 13.9 g/dL (ref 13.2–17.1)
MCH: 30.5 pg (ref 27.0–33.0)
MCHC: 34.4 g/dL (ref 32.0–36.0)
MCV: 88.8 fL (ref 80.0–100.0)
MPV: 11.1 fL (ref 7.5–12.5)
Platelets: 150 10*3/uL (ref 140–400)
RBC: 4.55 10*6/uL (ref 4.20–5.80)
RDW: 13.4 % (ref 11.0–15.0)
WBC: 4.5 10*3/uL (ref 3.8–10.8)

## 2019-05-04 LAB — COMPLETE METABOLIC PANEL WITH GFR
AG Ratio: 1.8 (calc) (ref 1.0–2.5)
ALT: 32 U/L (ref 9–46)
AST: 22 U/L (ref 10–35)
Albumin: 4.3 g/dL (ref 3.6–5.1)
Alkaline phosphatase (APISO): 82 U/L (ref 35–144)
BUN: 18 mg/dL (ref 7–25)
CO2: 21 mmol/L (ref 20–32)
Calcium: 9.9 mg/dL (ref 8.6–10.3)
Chloride: 108 mmol/L (ref 98–110)
Creat: 1.07 mg/dL (ref 0.70–1.25)
GFR, Est African American: 84 mL/min/{1.73_m2} (ref >=60)
GFR, Est Non African American: 72 mL/min/{1.73_m2} (ref >=60)
Globulin: 2.4 g/dL (calc) (ref 1.9–3.7)
Glucose, Bld: 106 mg/dL — ABNORMAL HIGH (ref 65–99)
Potassium: 3.5 mmol/L (ref 3.5–5.3)
Sodium: 139 mmol/L (ref 135–146)
Total Bilirubin: 0.6 mg/dL (ref 0.2–1.2)
Total Protein: 6.7 g/dL (ref 6.1–8.1)

## 2019-05-04 LAB — TSH: TSH: 2.01 mIU/L (ref 0.40–4.50)

## 2019-05-04 LAB — LIPID PANEL
Cholesterol: 232 mg/dL — ABNORMAL HIGH (ref <200)
HDL: 39 mg/dL — ABNORMAL LOW (ref >=40)
LDL Cholesterol (Calc): 167 mg/dL (calc) — ABNORMAL HIGH
Non-HDL Cholesterol (Calc): 193 mg/dL (calc) — ABNORMAL HIGH (ref <130)
Total CHOL/HDL Ratio: 5.9 (calc) — ABNORMAL HIGH (ref <5.0)
Triglycerides: 125 mg/dL (ref <150)

## 2019-05-04 LAB — PSA: PSA: 0.7 ng/mL (ref <=4.0)

## 2019-05-11 ENCOUNTER — Telehealth: Payer: Self-pay | Admitting: *Deleted

## 2019-05-11 NOTE — Telephone Encounter (Signed)
Pt LVM stating he received the letter from [place] to return a call about his blood work results. He has seen the results and he knows about his cholesterol and blood sugar but this is due to his past history. If you need to tell him anything further he can be reached at [phone]

## 2019-05-11 NOTE — Telephone Encounter (Signed)
Spoke with patient and gave lab results 

## 2019-06-14 ENCOUNTER — Other Ambulatory Visit: Payer: Self-pay | Admitting: Family Medicine

## 2019-06-19 ENCOUNTER — Other Ambulatory Visit: Payer: Self-pay

## 2019-06-19 ENCOUNTER — Encounter (HOSPITAL_COMMUNITY): Payer: Self-pay | Admitting: Psychiatry

## 2019-06-19 ENCOUNTER — Ambulatory Visit (INDEPENDENT_AMBULATORY_CARE_PROVIDER_SITE_OTHER): Payer: Medicare HMO | Admitting: Psychiatry

## 2019-06-19 DIAGNOSIS — F4310 Post-traumatic stress disorder, unspecified: Secondary | ICD-10-CM | POA: Diagnosis not present

## 2019-06-19 MED ORDER — PRAZOSIN HCL 5 MG PO CAPS: 5.0000 mg | ORAL_CAPSULE | Freq: Every day | ORAL | 1 refills | Status: DC

## 2019-06-19 MED ORDER — QUETIAPINE FUMARATE 50 MG PO TABS: 50.0000 mg | ORAL_TABLET | Freq: Four times a day (QID) | ORAL | 2 refills | Status: DC

## 2019-06-19 MED ORDER — MIRTAZAPINE 15 MG PO TABS: 15.0000 mg | ORAL_TABLET | Freq: Every day | ORAL | 2 refills | Status: DC

## 2019-06-19 NOTE — Progress Notes (Signed)
Virtual Visit via Telephone Note  I connected with Marc [name] on 06/19/19 at  1:20 PM EST by telephone and verified that I am speaking with the correct person using two identifiers.   I discussed the limitations, risks, security and privacy concerns of performing an evaluation and management service by telephone and the availability of in person appointments. I also discussed with the patient that there may be a patient responsible charge related to this service. The patient expressed understanding and agreed to proceed.     I discussed the assessment and treatment plan with the patient. The patient was provided an opportunity to ask questions and all were answered. The patient agreed with the plan and demonstrated an understanding of the instructions.   The patient was advised to call back or seek an in-person evaluation if the symptoms worsen or if the condition fails to improve as anticipated.  I provided 15 minutes of non-face-to-face time during this encounter.   Marc Spiller, MD  [facility] MD/PA/NP OP Progress Note  06/19/2019 1:46 PM Marc [name]  MRN:  [id number]  Chief Complaint:  Chief Complaint    Depression; Anxiety; Alcohol Problem; Follow-up     HPI: This patient is a 63 year old divorced white male who lives alone in [place].  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  He has 1 son who lives in [place].  The patient returns after 2 months.  He states that he has had "up and downs" in moods.  He has been very active in [place] and often stays late after meetings to talk to people.  Consequently he is not going to bed until midnight or later.  He then wonders why he feels groggy the next day when he does not take his nighttime medications until 1 AM.  I explained that if he wants this to work he needs to take his medications by 930 or 10 and go to bed.  He states that he still has some problems with depression but for the most part he is doing  better.  The main thing is that he is staying off of alcohol.  He denies any thoughts of suicidal ideation or self-harm.  For the most part he is sleeping well and does have some nightmares but they are not as frequent or severe.  He still suffering from a lot of chronic pain in his foot and arm. Visit Diagnosis:    ICD-10-CM   1. PTSD (post-traumatic stress disorder)  F43.10 prazosin (MINIPRESS) 5 MG capsule    Past Psychiatric History: Prior hospitalizations for alcohol detox, the last one being about 6 months ago  Past Medical History:  Past Medical History:  Diagnosis Date  . Acute encephalopathy 12/29/2017   Hospitalized 5/22 to 5/23 with acute encephalopathy, unclear etiology  . Allergy   . Anxiety   . Arthritis   . Back problem   . Depression   . GERD (gastroesophageal reflux disease)   . Hepatitis C 1979  . Hepatitis C reactive    LIVER BX 2008-CHRONIC ACTIVE HEPATITIS  . HTN (hypertension)   . Hx of adenomatous colonic polyps 2008   due for surveillance 2017  . Hypercholesterolemia   . IBS (irritable bowel syndrome)   . Knee pain   . Leukopenia 11/06/2015  . Normal cardiac stress test 05/25/2011   [facility]  . Normal echocardiogram 05/25/2011   EF 55%, mild TR  . Substance abuse ([place])  narcotic addiction  . Thrombocytopenia ([place]) 11/06/2015    Past Surgical History:  Procedure Laterality Date  . APPENDECTOMY    . CARPAL TUNNEL RELEASE     rt  . COLONOSCOPY  2008 SLF ARS D100 V8 PHEN 12.5   2 SIMPLE ADENOMAS (< 1 CM)  . ESOPHAGOGASTRODUODENOSCOPY  04/26/2012   Dr. Oneida Alar: Non-erosive gastritis (inflammation) was found in the gastric antrum but no H.pylori; multiple biopsies (duodenal bx negative for Celiac)/ The mucosa of the esophagus appeared normal  . EUS  09/08/2012   Dr. Ardis Hughs: CBD dilated but no stones. Query secondary to Sphincter of Oddi stenosis, ?dysfunction, but clinically without symptoms  . HARDWARE REMOVAL Right 02/12/2014    Procedure: HARDWARE REMOVAL;  Surgeon: Jolyn Nap, MD;  Location: [facility];  Service: Orthopedics;  Laterality: Right;  . KNEE ARTHROSCOPY    . Neurostimulator implant    . right ring finger    . spinal stenosis, had screws put in neck  04/11/2009   DR KRITZER  . TONSILLECTOMY    . ULNA OSTEOTOMY Right 02/12/2014   Procedure: RIGHT ULNAR SHORTENING AND OSTEOTOMY ;  Surgeon: Jolyn Nap, MD;  Location: [facility];  Service: Orthopedics;  Laterality: Right;  . ULNAR NERVE TRANSPOSITION    . UPPER GASTROINTESTINAL ENDOSCOPY  2008 SLF ABD PAIN WEIGHT LOSS d100 v8 phen 12.5   NL  . WRIST SURGERY Right jan 2015   Dr. Edmonia Lynch    Family Psychiatric History: See below  Family History:  Family History  Problem Relation Age of Onset  . Bladder Cancer Mother        rare form   . Cancer Sister        Metastatic abdominal  . Emotional abuse Sister   . Stroke Sister   . Emotional abuse Sister   . Heart failure Father   . Stroke Father   . Alcohol abuse Father   . Dementia Paternal Aunt   . Alcohol abuse Paternal Uncle   . Dementia Paternal Uncle   . Dementia Paternal Grandmother   . Stroke Paternal Grandmother   . ADD / ADHD Cousin   . Bipolar disorder Cousin   . Anxiety disorder Cousin   . Depression Cousin        Committed suicide  . Alcohol abuse Cousin   . OCD Cousin   . Paranoid behavior Cousin   . Brain cancer Maternal Grandfather   . Heart defect Other        FAMILY HX  . Colon cancer Maternal Uncle   . Colon polyps Neg Hx   . Drug abuse Neg Hx   . Schizophrenia Neg Hx   . Seizures Neg Hx   . Sexual abuse Neg Hx   . Physical abuse Neg Hx     Social History:  Social History   Socioeconomic History  . Marital status: Divorced    Spouse name: Not on file  . Number of children: Not on file  . Years of education: Not on file  . Highest education level: Not on file  Occupational History  . Not on file  Social Needs  .  Financial resource strain: Not hard at all  . Food insecurity    Worry: Never true    Inability: Never true  . Transportation needs    Medical: No    Non-medical: No  Tobacco Use  . Smoking status: Former Smoker    Packs/day: 1.50    Years:  8.00    Pack years: 12.00    Types: Cigarettes    Quit date: 08/13/1986    Years since quitting: 32.8  . Smokeless tobacco: Never Used  . Tobacco comment: quit 20 + yrs ago  Substance and Sexual Activity  . Alcohol use: No    Alcohol/week: 0.0 standard drinks  . Drug use: No  . Sexual activity: Never  Lifestyle  . Physical activity    Days per week: 0 days    Minutes per session: 0 min  . Stress: Very much  Relationships  . Social connections    Talks on phone: More than three times a week    Gets together: Three times a week    Attends religious service: Never    Active member of club or organization: No    Attends meetings of clubs or organizations: Never    Relationship status: Divorced  Other Topics Concern  . Not on file  Social History Narrative  . Not on file    Allergies:  Allergies  Allergen Reactions  . Levofloxacin Nausea Only and Other (See Comments)    "Creepy" feeling, skin didn't feel right, sort of itchy.  . Ciprofloxacin     Other reaction(s): Sweating (intolerance)  . Sulfa Antibiotics     Metabolic Disorder Labs: Lab Results  Component Value Date   HGBA1C 5.5 12/30/2017   MPG 111.15 12/30/2017   MPG 111 10/22/2016   No results found for: PROLACTIN Lab Results  Component Value Date   CHOL 232 (H) 05/03/2019   TRIG 125 05/03/2019   HDL 39 (L) 05/03/2019   CHOLHDL 5.9 (H) 05/03/2019   VLDL 27 10/22/2016   LDLCALC 167 (H) 05/03/2019   LDLCALC 145 (H) 10/22/2016   Lab Results  Component Value Date   TSH 2.01 05/03/2019   TSH 2.056 12/30/2017    Therapeutic Level Labs: No results found for: LITHIUM No results found for: VALPROATE No components found for:  CBMZ  Current Medications: Current  Outpatient Medications  Medication Sig Dispense Refill  . acetaminophen (TYLENOL) 500 MG tablet Take 500 mg by mouth as needed.    . Ascorbic Acid (VITAMIN C PO) Take 1 Dose by mouth daily.    Marland Kitchen aspirin EC 81 MG tablet Take 81 mg by mouth daily.    . Cholecalciferol (VITAMIN D3 PO) Take 1 Dose by mouth daily.    . cloNIDine (CATAPRES) 0.2 MG tablet TAKE (1) TABLET BY MOUTH TWICE DAILY. 60 tablet 0  . dimenhyDRINATE (DRAMAMINE) 50 MG tablet Take 50 mg by mouth every 8 (eight) hours as needed for itching.    . mirtazapine (REMERON) 15 MG tablet Take 1 tablet (15 mg total) by mouth at bedtime. 30 tablet 2  . Multiple Vitamins-Minerals (MULTIVITAMIN ADULTS 50+) TABS Take 1 tablet by mouth daily.    . naproxen (NAPROSYN) 500 MG tablet Take 1 tablet (500 mg total) by mouth 2 (two) times daily with a meal. 30 tablet 0  . olopatadine (PATANOL) 0.1 % ophthalmic solution Place 1 drop into both eyes 2 (two) times daily. 5 mL 2  . Omega-3 Fatty Acids (FISH OIL) 1000 MG CAPS Take 1 capsule by mouth daily.    . ondansetron (ZOFRAN) 4 MG tablet One tablet once daily as needed, for uncontrolled nausea 7 tablet 0  . prazosin (MINIPRESS) 5 MG capsule Take 1 capsule (5 mg total) by mouth at bedtime. 30 capsule 1  . QUEtiapine (SEROQUEL) 50 MG tablet Take 1 tablet (50 mg  total) by mouth 4 (four) times daily. 120 tablet 2   No current facility-administered medications for this visit.      Musculoskeletal: Strength & Muscle Tone: decreased Gait & Station: unsteady Patient leans: N/A  Psychiatric Specialty Exam: Review of Systems  Musculoskeletal: Positive for joint pain.  Psychiatric/Behavioral: Positive for depression. The patient is nervous/anxious.   All other systems reviewed and are negative.   There were no vitals taken for this visit.There is no height or weight on file to calculate BMI.  General Appearance: NA  Eye Contact:  NA  Speech:  Clear and Coherent  Volume:  Normal  Mood:  Anxious and  Euthymic  Affect:  NA  Thought Process:  Goal Directed  Orientation:  Full (Time, Place, and Person)  Thought Content: Rumination   Suicidal Thoughts:  No  Homicidal Thoughts:  No  Memory:  Immediate;   Good Recent;   Good Remote;   Good  Judgement:  Fair  Insight:  Fair  Psychomotor Activity:  Decreased  Concentration:  Concentration: Good and Attention Span: Good  Recall:  Good  Fund of Knowledge: Good  Language: Good  Akathisia:  No  Handed:  Right  AIMS (if indicated): not done  Assets:  Communication Skills Desire for Improvement Resilience Social Support Talents/Skills  ADL's:  Intact  Cognition: WNL  Sleep:  Fair   Screenings: PHQ2-9     Office Visit from 04/11/2019 in [facility] Office Visit from 02/28/2019 in [facility] from 02/08/2019 in [facility] Office Visit from 01/19/2019 in [facility] Office Visit from 03/15/2018 in [facility]  PHQ-2 Total Score  3  4  3  1  4   PHQ-9 Total Score  14  20  19   -  11       Assessment and Plan: This patient is a 63 year old male with a long history of depression and alcohol abuse.  He seems to be a little bit better but he is experimenting with stopping mirtazapine or prazosin because of his "hangover" the next day.  Rather than stop the medicines I advised him to take the much earlier in the evening.  For now he will continue mirtazapine 15 mg at bedtime for depression, prazosin 5 mg at bedtime for nightmares and Seroquel 50 mg 4 times daily for mood stabilization.  He will return to see me in 3 months   Marc Spiller, MD 06/19/2019, 1:47 PM

## 2019-07-11 ENCOUNTER — Other Ambulatory Visit: Payer: Self-pay

## 2019-07-11 ENCOUNTER — Telehealth: Payer: Self-pay | Admitting: *Deleted

## 2019-07-11 MED ORDER — CLONIDINE HCL 0.2 MG PO TABS: ORAL_TABLET | ORAL | 3 refills | Status: DC

## 2019-07-11 NOTE — Telephone Encounter (Signed)
Med sent.

## 2019-07-11 NOTE — Telephone Encounter (Signed)
Pt needs his clonidine sent to [facility] he has no refills on his medication

## 2019-08-15 ENCOUNTER — Other Ambulatory Visit: Payer: Self-pay

## 2019-08-15 ENCOUNTER — Encounter: Payer: Self-pay | Admitting: Family Medicine

## 2019-08-15 ENCOUNTER — Ambulatory Visit (INDEPENDENT_AMBULATORY_CARE_PROVIDER_SITE_OTHER): Payer: Medicare HMO | Admitting: Family Medicine

## 2019-08-15 VITALS — BP 120/70 | Ht 70.0 in | Wt 170.0 lb

## 2019-08-15 DIAGNOSIS — Z860100 Personal history of colon polyps, unspecified: Secondary | ICD-10-CM

## 2019-08-15 DIAGNOSIS — Z79899 Other long term (current) drug therapy: Secondary | ICD-10-CM | POA: Diagnosis not present

## 2019-08-15 DIAGNOSIS — F419 Anxiety disorder, unspecified: Secondary | ICD-10-CM | POA: Diagnosis not present

## 2019-08-15 DIAGNOSIS — I10 Essential (primary) hypertension: Secondary | ICD-10-CM | POA: Diagnosis not present

## 2019-08-15 DIAGNOSIS — E559 Vitamin D deficiency, unspecified: Secondary | ICD-10-CM

## 2019-08-15 DIAGNOSIS — F1920 Other psychoactive substance dependence, uncomplicated: Secondary | ICD-10-CM

## 2019-08-15 DIAGNOSIS — F331 Major depressive disorder, recurrent, moderate: Secondary | ICD-10-CM

## 2019-08-15 DIAGNOSIS — F32A Depression, unspecified: Secondary | ICD-10-CM

## 2019-08-15 DIAGNOSIS — Z1211 Encounter for screening for malignant neoplasm of colon: Secondary | ICD-10-CM

## 2019-08-15 DIAGNOSIS — F329 Major depressive disorder, single episode, unspecified: Secondary | ICD-10-CM | POA: Diagnosis not present

## 2019-08-15 DIAGNOSIS — Z13220 Encounter for screening for lipoid disorders: Secondary | ICD-10-CM

## 2019-08-15 DIAGNOSIS — K649 Unspecified hemorrhoids: Secondary | ICD-10-CM

## 2019-08-15 MED ORDER — CLONIDINE HCL 0.3 MG PO TABS: 0.3000 mg | ORAL_TABLET | Freq: Every day | ORAL | 3 refills | Status: DC

## 2019-08-15 NOTE — Assessment & Plan Note (Signed)
Currently stable,  Managed by Psychiatry

## 2019-08-15 NOTE — Assessment & Plan Note (Signed)
Colonoscopy is overdue and c/o hemorrhoids , refer to GI

## 2019-08-15 NOTE — Assessment & Plan Note (Signed)
Intolerant of clonidine twice daily, change  To bedtime clonidine 0.3 mg DASH diet and commitment to daily physical activity for a minimum of 30 minutes discussed and encouraged, as a part of hypertension management. The importance of attaining a healthy weight is also discussed.  BP/Weight 08/15/2019 04/11/2019 02/28/2019 02/21/2019 02/08/2019 01/31/2019 [id number]  Systolic BP [id number] A999333 A999333 [id number]  Diastolic BP 70 80 80 83 72 72 86  Wt. (Lbs) 170 174.04 169 167.5 158 164 158  BMI 24.39 24.97 24.25 24.03 22.67 23.53 22.67  Some encounter information is confidential and restricted. Go to Review Flowsheets activity to see all data.

## 2019-08-15 NOTE — Patient Instructions (Addendum)
Fasting lipid, cmp and EGFR and vit D tomorrow, pt will also collect discharge instructions then ( [facility])  F/u in office with md in 6 to 8 weeks, re evaluate blood pressure  You are referred to Dr Oneida Alar re hemmorhoids and for colonoscopy which is overdue  Paperwor with chair exercises is provided   High Cholesterol  High cholesterol is a condition in which the blood has high levels of a white, waxy, fat-like substance (cholesterol). The human body needs small amounts of cholesterol. The liver makes all the cholesterol that the body needs. Extra (excess) cholesterol comes from the food that we eat. Cholesterol is carried from the liver by the blood through the blood vessels. If you have high cholesterol, deposits (plaques) may build up on the walls of your blood vessels (arteries). Plaques make the arteries narrower and stiffer. Cholesterol plaques increase your risk for heart attack and stroke. Work with your health care provider to keep your cholesterol levels in a healthy range. What increases the risk? This condition is more likely to develop in people who:  Eat foods that are high in animal fat (saturated fat) or cholesterol.  Are overweight.  Are not getting enough exercise.  Have a family history of high cholesterol. What are the signs or symptoms? There are no symptoms of this condition. How is this diagnosed? This condition may be diagnosed from the results of a blood test.  If you are older than age 54, your health care provider may check your cholesterol every 4-6 years.  You may be checked more often if you already have high cholesterol or other risk factors for heart disease. The blood test for cholesterol measures:  "Bad" cholesterol (LDL cholesterol). This is the main type of cholesterol that causes heart disease. The desired level for LDL is less than 100.  "Good" cholesterol (HDL cholesterol). This type helps to protect against heart disease by cleaning the  arteries and carrying the LDL away. The desired level for HDL is 60 or higher.  Triglycerides. These are fats that the body can store or burn for energy. The desired number for triglycerides is lower than 150.  Total cholesterol. This is a measure of the total amount of cholesterol in your blood, including LDL cholesterol, HDL cholesterol, and triglycerides. A healthy number is less than 200. How is this treated? This condition is treated with diet changes, lifestyle changes, and medicines. Diet changes  This may include eating more whole grains, fruits, vegetables, nuts, and fish.  This may also include cutting back on red meat and foods that have a lot of added sugar. Lifestyle changes  Changes may include getting at least 40 minutes of aerobic exercise 3 times a week. Aerobic exercises include walking, biking, and swimming. Aerobic exercise along with a healthy diet can help you maintain a healthy weight.  Changes may also include quitting smoking. Medicines  Medicines are usually given if diet and lifestyle changes have failed to reduce your cholesterol to healthy levels.  Your health care provider may prescribe a statin medicine. Statin medicines have been shown to reduce cholesterol, which can reduce the risk of heart disease. Follow these instructions at home: Eating and drinking If told by your health care provider:  Eat chicken (without skin), fish, veal, shellfish, ground [place] breast, and round or loin cuts of red meat.  Do not eat fried foods or fatty meats, such as hot dogs and salami.  Eat plenty of fruits, such as apples.  Eat plenty of  vegetables, such as broccoli, potatoes, and carrots.  Eat beans, peas, and lentils.  Eat grains such as barley, rice, couscous, and bulgur wheat.  Eat pasta without cream sauces.  Use skim or nonfat milk, and eat low-fat or nonfat yogurt and cheeses.  Do not eat or drink whole milk, cream, ice cream, egg yolks, or hard  cheeses.  Do not eat stick margarine or tub margarines that contain trans fats (also called partially hydrogenated oils).  Do not eat saturated tropical oils, such as coconut oil and palm oil.  Do not eat cakes, cookies, crackers, or other baked goods that contain trans fats.  General instructions  Exercise as directed by your health care provider. Increase your activity level with activities such as gardening, walking, and taking the stairs.  Take over-the-counter and prescription medicines only as told by your health care provider.  Do not use any products that contain nicotine or tobacco, such as cigarettes and e-cigarettes. If you need help quitting, ask your health care provider.  Keep all follow-up visits as told by your health care provider. This is important. Contact a health care provider if:  You are struggling to maintain a healthy diet or weight.  You need help to start on an exercise program.  You need help to stop smoking. Get help right away if:  You have chest pain.  You have trouble breathing. This information is not intended to replace advice given to you by your health care provider. Make sure you discuss any questions you have with your health care provider. Document Revised: 07/30/2017 Document Reviewed: 01/25/2016 Elsevier Patient Education  [place].  Preventing High Cholesterol Cholesterol is a white, waxy substance similar to fat that the human body needs to help build cells. The liver makes all the cholesterol that a person's body needs. Having high cholesterol (hypercholesterolemia) increases a person's risk for heart disease and stroke. Extra (excess) cholesterol comes from the food the person eats. High cholesterol can often be prevented with diet and lifestyle changes. If you already have high cholesterol, you can control it with diet and lifestyle changes and with medicine. How can high cholesterol affect me? If you have high cholesterol,  deposits (plaques) may build up on the walls of your arteries. The arteries are the blood vessels that carry blood away from your heart. Plaques make the arteries narrower and stiffer. This can limit or block blood flow and cause blood clots to form. Blood clots:  Are tiny balls of cells that form in your blood.  Can move to the heart or brain, causing a heart attack or stroke. Plaques in arteries greatly increase your risk for heart attack and stroke.Making diet and lifestyle changes can reduce your risk for these conditions that may threaten your life. What can increase my risk? This condition is more likely to develop in people who:  Eat foods that are high in saturated fat or cholesterol. Saturated fat is mostly found in: ? Foods that contain animal fat, such as red meat and some dairy products. ? Certain fatty foods made from plants, such as tropical oils.  Are overweight.  Are not getting enough exercise.  Have a family history of high cholesterol. What actions can I take to prevent this? Nutrition   Eat less saturated fat.  Avoid trans fats (partially hydrogenated oils). These are often found in margarine and in some baked goods, fried foods, and snacks bought in packages.  Avoid precooked or cured meat, such as sausages or  meat loaves.  Avoid foods and drinks that have added sugars.  Eat more fruits, vegetables, and whole grains.  Choose healthy sources of protein, such as fish, poultry, lean cuts of red meat, beans, peas, lentils, and nuts.  Choose healthy sources of fat, such as: ? Nuts. ? Vegetable oils, especially olive oil. ? Fish that have healthy fats (omega-3 fatty acids), such as mackerel or salmon. The items listed above may not be a complete list of recommended foods and beverages. Contact a dietitian for more information. Lifestyle  Lose weight if you are overweight. Losing 5-10 lb (2.3-4.5 kg) can help prevent or control high cholesterol. It can also  lower your risk for diabetes and high blood pressure. Ask your health care provider to help you with a diet and exercise plan to lose weight safely.  Do not use any products that contain nicotine or tobacco, such as cigarettes, e-cigarettes, and chewing tobacco. If you need help quitting, ask your health care provider.  Limit your alcohol intake. ? Do not drink alcohol if:  Your health care provider tells you not to drink.  You are pregnant, may be pregnant, or are planning to become pregnant. ? If you drink alcohol:  Limit how much you use to:  0-1 drink a day for women.  0-2 drinks a day for men.  Be aware of how much alcohol is in your drink. In the [place] one drink equals one 12 oz bottle of beer (355 mL), one 5 oz glass of wine (148 mL), or one 1 oz glass of hard liquor (44 mL). Activity   Get enough exercise. Each week, do at least 150 minutes of exercise that takes a medium level of effort (moderate-intensity exercise). ? This is exercise that:  Makes your heart beat faster and makes you breathe harder than usual.  Allows you to still be able to talk. ? You could exercise in short sessions several times a day or longer sessions a few times a week. For example, on 5 days each week, you could walk fast or ride your bike 3 times a day for 10 minutes each time.  Do exercises as told by your health care provider. Medicines  In addition to diet and lifestyle changes, your health care provider may recommend medicines to help lower cholesterol. This may be a medicine to lower the amount of cholesterol your liver makes. You may need medicine if: ? Diet and lifestyle changes do not lower your cholesterol enough. ? You have high cholesterol and other risk factors for heart disease or stroke.  Take over-the-counter and prescription medicines only as told by your health care provider. General information  Manage your risk factors for high cholesterol. Talk with your health care  provider about all your risk factors and how to lower your risk.  Manage other conditions that you have, such as diabetes or high blood pressure (hypertension).  Have blood tests to check your cholesterol levels at regular points in time as told by your health care provider.  Keep all follow-up visits as told by your health care provider. This is important. Where to find more information  American Heart Association: www.heart.org  National Heart, Lung, and Blood Institute: https://wilson-eaton.com/ Summary  High cholesterol increases your risk for heart disease and stroke. By keeping your cholesterol level low, you can reduce your risk for these conditions.  High cholesterol can often be prevented with diet and lifestyle changes.  Work with your health care provider to manage your risk  factors, and have your blood tested regularly. This information is not intended to replace advice given to you by your health care provider. Make sure you discuss any questions you have with your health care provider. Document Revised: 11/18/2018 Document Reviewed: 04/04/2016 Elsevier Patient Education  2020 [employer].

## 2019-08-15 NOTE — Progress Notes (Signed)
Virtual Visit via Telephone Note  I connected with Marc [name] on 08/15/19 at  1:20 PM EST by telephone and verified that I am speaking with the correct person using two identifiers.  Location: Patient: home  Provider: office   I discussed the limitations, risks, security and privacy concerns of performing an evaluation and management service by telephone and the availability of in person appointments. I also discussed with the patient that there may be a patient responsible charge related to this service. The patient expressed understanding and agreed to proceed.   History of Present Illness: F/U chronic problems C/o excess sleepinesswhen he takes clonidine as prescribed , and is not fully compliant as a result Reports being alcohol free for 7 months, an attends AA meetings regularly, he is proud of this achievement  Denies recent fever or chills. Denies sinus pressure, nasal congestion, ear pain or sore throat. Denies chest congestion, productive cough or wheezing. Denies chest pains, palpitations and leg swelling Denies abdominal pain, nausea, vomiting,diarrhea or constipation.  C/o problems with hemmorhoids Denies dysuria, frequency, hesitancy or incontinence. Generalized joint pain, swelling and limitation in mobility, limits ability to exercise , even walk due to ankle pain. Denies headaches, seizures, numbness, or tingling. Denies uncontrolled  depression, anxiety or insomnia. Denies skin break down or rash.       Observations/Objective: BP 120/70   Ht 5\' 10"  (1.778 m)   Wt 170 lb (77.1 kg)   BMI 24.39 kg/m  Good communication with no confusion and intact memory. Alert and oriented x 3 No signs of respiratory distress during speech    Assessment and Plan:  Essential hypertension Intolerant of clonidine twice daily, change  To bedtime clonidine 0.3 mg DASH diet and commitment to daily physical activity for a minimum of 30 minutes discussed and encouraged, as a  part of hypertension management. The importance of attaining a healthy weight is also discussed.  BP/Weight 08/15/2019 04/11/2019 02/28/2019 02/21/2019 02/08/2019 01/31/2019 [id number]  Systolic BP [id number]  Diastolic BP 70 80 80 83 72 72 86  Wt. (Lbs) 170 174.04 169 167.5 158 164 158  BMI 24.39 24.97 24.25 24.03 22.67 23.53 22.67  Some encounter information is confidential and restricted. Go to Review Flowsheets activity to see all data.       Anxiety and depression Currently stable,  Managed by Psychiatry  History of colonic polyps Colonoscopy is overdue and c/o hemorrhoids , refer to GI   Follow Up Instructions:    I discussed the assessment and treatment plan with the patient. The patient was provided an opportunity to ask questions and all were answered. The patient agreed with the plan and demonstrated an understanding of the instructions.   The patient was advised to call back or seek an in-person evaluation if the symptoms worsen or if the condition fails to improve as anticipated.  I provided 15 minutes of non-face-to-face time during this encounter.   Tula Nakayama, MD

## 2019-08-16 DIAGNOSIS — I10 Essential (primary) hypertension: Secondary | ICD-10-CM | POA: Diagnosis not present

## 2019-08-16 DIAGNOSIS — Z13220 Encounter for screening for lipoid disorders: Secondary | ICD-10-CM | POA: Diagnosis not present

## 2019-08-16 DIAGNOSIS — E559 Vitamin D deficiency, unspecified: Secondary | ICD-10-CM | POA: Diagnosis not present

## 2019-08-17 ENCOUNTER — Encounter: Payer: Self-pay | Admitting: Family Medicine

## 2019-08-17 ENCOUNTER — Encounter: Payer: Self-pay | Admitting: Gastroenterology

## 2019-08-17 LAB — COMPLETE METABOLIC PANEL WITH GFR
AG Ratio: 1.8 (calc) (ref 1.0–2.5)
ALT: 22 U/L (ref 9–46)
AST: 17 U/L (ref 10–35)
Albumin: 4.1 g/dL (ref 3.6–5.1)
Alkaline phosphatase (APISO): 84 U/L (ref 35–144)
BUN: 17 mg/dL (ref 7–25)
CO2: 28 mmol/L (ref 20–32)
Calcium: 9.6 mg/dL (ref 8.6–10.3)
Chloride: 107 mmol/L (ref 98–110)
Creat: 1.11 mg/dL (ref 0.70–1.25)
GFR, Est African American: 80 mL/min/{1.73_m2} (ref >=60)
GFR, Est Non African American: 69 mL/min/{1.73_m2} (ref >=60)
Globulin: 2.3 g/dL (calc) (ref 1.9–3.7)
Glucose, Bld: 103 mg/dL — ABNORMAL HIGH (ref 65–99)
Potassium: 4.2 mmol/L (ref 3.5–5.3)
Sodium: 140 mmol/L (ref 135–146)
Total Bilirubin: 0.6 mg/dL (ref 0.2–1.2)
Total Protein: 6.4 g/dL (ref 6.1–8.1)

## 2019-08-17 LAB — VITAMIN D 25 HYDROXY (VIT D DEFICIENCY, FRACTURES): Vit D, 25-Hydroxy: 33 ng/mL (ref 30–100)

## 2019-08-17 LAB — LIPID PANEL
Cholesterol: 189 mg/dL (ref <200)
HDL: 44 mg/dL (ref >=40)
LDL Cholesterol (Calc): 129 mg/dL (calc) — ABNORMAL HIGH
Non-HDL Cholesterol (Calc): 145 mg/dL (calc) — ABNORMAL HIGH (ref <130)
Total CHOL/HDL Ratio: 4.3 (calc) (ref <5.0)
Triglycerides: 65 mg/dL (ref <150)

## 2019-09-07 ENCOUNTER — Ambulatory Visit (INDEPENDENT_AMBULATORY_CARE_PROVIDER_SITE_OTHER): Payer: Medicare HMO | Admitting: Gastroenterology

## 2019-09-07 ENCOUNTER — Other Ambulatory Visit: Payer: Self-pay

## 2019-09-07 ENCOUNTER — Encounter: Payer: Self-pay | Admitting: Gastroenterology

## 2019-09-07 VITALS — BP 131/71 | HR 62 | Temp 97.0°F | Ht 70.0 in | Wt 168.4 lb

## 2019-09-07 DIAGNOSIS — Z8619 Personal history of other infectious and parasitic diseases: Secondary | ICD-10-CM | POA: Diagnosis not present

## 2019-09-07 DIAGNOSIS — K5900 Constipation, unspecified: Secondary | ICD-10-CM | POA: Insufficient documentation

## 2019-09-07 DIAGNOSIS — K6289 Other specified diseases of anus and rectum: Secondary | ICD-10-CM | POA: Diagnosis not present

## 2019-09-07 MED ORDER — HYDROCORTISONE (PERIANAL) 2.5 % EX CREA: 1.0000 "application " | TOPICAL_CREAM | Freq: Two times a day (BID) | CUTANEOUS | 1 refills | Status: DC

## 2019-09-07 NOTE — Patient Instructions (Signed)
For constipation: start taking Linzess one capsule each morning on an empty stomach. This may cause loose stool at first but should get better. If not, call me, and we can adjust the dosage.  For rectal discomfort/pain: start using the cream per rectum twice a day. Avoid straining, limit toilet time to 2-3 minutes.   We are arranging a colonoscopy with possible upper endoscopy by Dr. Oneida Alar in the future.  Please complete the blood work when you are able. We have also ordered an ultrasound to assess your liver.   Further recommendations to follow!  I enjoyed seeing you again today! As you know, I value our relationship and want to provide genuine, compassionate, and quality care. I welcome your feedback. If you receive a survey regarding your visit,  I greatly appreciate you taking time to fill this out. See you next time!  Annitta Needs, PhD, ANP-BC [facility]

## 2019-09-07 NOTE — Progress Notes (Addendum)
REVIEWED. JAN 2021: NO ETOH, NEG HEP VL, [name] ELASTOGRAPHY FEB 2021-NORMAL LIVER, kPa < 7. NO ADDITIONAL IMAGING NEEDED.  Referring Provider: Fayrene Helper, MD Primary Care Physician:  Fayrene Helper, MD Primary GI: Dr. Oneida Alar   Chief Complaint  Patient presents with  . Hemorrhoids    HPI:   Marc [name] is a 64 y.o. male presenting today with a history of Hep C genotype 1a, elastography F3F4, s/p treatment with Harvoni and 3 month post treatment negative RNA. Needs additional RNA, which was due in 2018. History of adenomas and overdue for surveillance colonoscopy. Needs elastography. Chronic thrombocytopenia and leukopenia followed by hematology.   Notes hemorrhoid, feels something bulging. Noticed about 9 months ago. Sometimes will reduce on own. +pain. +constipation. Takes stool softener and will sometimes skip 2 days without going. Usually BM 1-2 times per day, but usually comes out in balls. Last night was soft. Notices blood as well. Sometimes painful in abdomen. No dysphagia. No significant GERD symptoms.   Past Medical History:  Diagnosis Date  . Acute encephalopathy 12/29/2017   Hospitalized 5/22 to 5/23 with acute encephalopathy, unclear etiology  . Allergy   . Anxiety   . Arthritis   . Back problem   . Depression   . GERD (gastroesophageal reflux disease)   . Hepatitis C 1979  . Hepatitis C reactive    LIVER BX 2008-CHRONIC ACTIVE HEPATITIS  . HTN (hypertension)   . Hx of adenomatous colonic polyps 2008   due for surveillance 2017  . Hypercholesterolemia   . IBS (irritable bowel syndrome)   . Knee pain   . Leukopenia 11/06/2015  . Normal cardiac stress test 05/25/2011   [facility] [place]  . Normal echocardiogram 05/25/2011   EF 55%, mild TR  . Substance abuse ([place])    narcotic addiction  . Thrombocytopenia ([place]) 11/06/2015    Past Surgical History:  Procedure Laterality Date  . APPENDECTOMY    . CARPAL TUNNEL RELEASE     rt  .  COLONOSCOPY  2008 SLF ARS D100 V8 PHEN 12.5   2 SIMPLE ADENOMAS (< 1 CM)  . ESOPHAGOGASTRODUODENOSCOPY  04/26/2012   Dr. Oneida Alar: Non-erosive gastritis (inflammation) was found in the gastric antrum but no H.pylori; multiple biopsies (duodenal bx negative for Celiac)/ The mucosa of the esophagus appeared normal  . EUS  09/08/2012   Dr. Ardis Hughs: CBD dilated but no stones. Query secondary to Sphincter of Oddi stenosis, ?dysfunction, but clinically without symptoms  . HARDWARE REMOVAL Right 02/12/2014   Procedure: HARDWARE REMOVAL;  Surgeon: Jolyn Nap, MD;  Location: [place];  Service: Orthopedics;  Laterality: Right;  . KNEE ARTHROSCOPY    . Neurostimulator implant    . right ring finger    . spinal stenosis, had screws put in neck  04/11/2009   DR KRITZER  . TONSILLECTOMY    . ULNA OSTEOTOMY Right 02/12/2014   Procedure: RIGHT ULNAR SHORTENING AND OSTEOTOMY ;  Surgeon: Jolyn Nap, MD;  Location: [place];  Service: Orthopedics;  Laterality: Right;  . ULNAR NERVE TRANSPOSITION    . UPPER GASTROINTESTINAL ENDOSCOPY  2008 SLF ABD PAIN WEIGHT LOSS d100 v8 phen 12.5   NL  . WRIST SURGERY Right jan 2015   Dr. Edmonia Lynch    Current Outpatient Medications  Medication Sig Dispense Refill  . acetaminophen (TYLENOL) 500 MG tablet Take 500 mg by mouth as needed.    . Ascorbic Acid (VITAMIN C PO)  Take 1 Dose by mouth daily.    Marland Kitchen aspirin EC 81 MG tablet Take 81 mg by mouth daily.    . Cholecalciferol (VITAMIN D3 PO) Take 1 Dose by mouth daily.    . cloNIDine (CATAPRES) 0.3 MG tablet Take 1 tablet (0.3 mg total) by mouth daily. 30 tablet 3  . dimenhyDRINATE (DRAMAMINE) 50 MG tablet Take 50 mg by mouth every 8 (eight) hours as needed for itching.    . mirtazapine (REMERON) 15 MG tablet Take 1 tablet (15 mg total) by mouth at bedtime. 30 tablet 2  . Multiple Vitamins-Minerals (MULTIVITAMIN ADULTS 50+) TABS Take 1 tablet by mouth daily.    . Omega-3 Fatty Acids  (FISH OIL) 1000 MG CAPS Take 1 capsule by mouth daily.    . prazosin (MINIPRESS) 5 MG capsule Take 1 capsule (5 mg total) by mouth at bedtime. 30 capsule 1  . QUEtiapine (SEROQUEL) 50 MG tablet Take 1 tablet (50 mg total) by mouth 4 (four) times daily. 120 tablet 2  . Turmeric 400 MG CAPS Take by mouth 3 (three) times daily.    . naproxen (NAPROSYN) 500 MG tablet Take 1 tablet (500 mg total) by mouth 2 (two) times daily with a meal. (Patient not taking: Reported on 09/07/2019) 30 tablet 0  . olopatadine (PATANOL) 0.1 % ophthalmic solution Place 1 drop into both eyes 2 (two) times daily. (Patient not taking: Reported on 09/07/2019) 5 mL 2  . ondansetron (ZOFRAN) 4 MG tablet One tablet once daily as needed, for uncontrolled nausea (Patient not taking: Reported on 09/07/2019) 7 tablet 0   No current facility-administered medications for this visit.    Allergies as of 09/07/2019 - Review Complete 09/07/2019  Allergen Reaction Noted  . Levofloxacin Nausea Only and Other (See Comments)   . Ciprofloxacin  06/13/2015  . Sulfa antibiotics  12/02/2012    Family History  Problem Relation Age of Onset  . Bladder Cancer Mother        rare form   . Cancer Sister        Metastatic abdominal  . Emotional abuse Sister   . Stroke Sister   . Emotional abuse Sister   . Heart failure Father   . Stroke Father   . Alcohol abuse Father   . Dementia Paternal Aunt   . Alcohol abuse Paternal Uncle   . Dementia Paternal Uncle   . Dementia Paternal Grandmother   . Stroke Paternal Grandmother   . ADD / ADHD Cousin   . Bipolar disorder Cousin   . Anxiety disorder Cousin   . Depression Cousin        Committed suicide  . Alcohol abuse Cousin   . OCD Cousin   . Paranoid behavior Cousin   . Brain cancer Maternal Grandfather   . Heart defect Other        FAMILY HX  . Colon cancer Maternal Uncle   . Colon polyps Neg Hx   . Drug abuse Neg Hx   . Schizophrenia Neg Hx   . Seizures Neg Hx   . Sexual abuse Neg  Hx   . Physical abuse Neg Hx     Social History   Socioeconomic History  . Marital status: Divorced    Spouse name: Not on file  . Number of children: Not on file  . Years of education: Not on file  . Highest education level: Not on file  Occupational History  . Not on file  Tobacco Use  . Smoking  status: Former Smoker    Packs/day: 1.50    Years: 8.00    Pack years: 12.00    Types: Cigarettes    Quit date: 08/13/1986    Years since quitting: 33.0  . Smokeless tobacco: Never Used  . Tobacco comment: quit 20 + yrs ago  Substance and Sexual Activity  . Alcohol use: No    Alcohol/week: 0.0 standard drinks  . Drug use: No  . Sexual activity: Never  Other Topics Concern  . Not on file  Social History Narrative  . Not on file   Social Determinants of Health   Financial Resource Strain: Low Risk   . Difficulty of Paying Living Expenses: Not hard at all  Food Insecurity: No Food Insecurity  . Worried About Charity fundraiser in the Last Year: Never true  . Ran Out of Food in the Last Year: Never true  Transportation Needs: No Transportation Needs  . Lack of Transportation (Medical): No  . Lack of Transportation (Non-Medical): No  Physical Activity: Inactive  . Days of Exercise per Week: 0 days  . Minutes of Exercise per Session: 0 min  Stress: Stress Concern Present  . Feeling of Stress : Very much  Social Connections: Moderately Isolated  . Frequency of Communication with Friends and Family: More than three times a week  . Frequency of Social Gatherings with Friends and Family: Three times a week  . Attends Religious Services: Never  . Active Member of Clubs or Organizations: No  . Attends Archivist Meetings: Never  . Marital Status: Divorced    Review of Systems: Gen: Denies fever, chills, anorexia. Denies fatigue, weakness, weight loss.  CV: Denies chest pain, palpitations, syncope, peripheral edema, and claudication. Resp: Denies dyspnea at rest,  cough, wheezing, coughing up blood, and pleurisy. GI: see HPI Derm: Denies rash, itching, dry skin Psych: Denies depression, anxiety, memory loss, confusion. No homicidal or suicidal ideation.  Heme: see HPI  Physical Exam: BP 131/71   Pulse 62   Temp (!) 97 F (36.1 C) (Temporal)   Ht 5\' 10"  (1.778 m)   Wt 168 lb 6.4 oz (76.4 kg)   BMI 24.16 kg/m  General:   Alert and oriented. No distress noted. Pleasant and cooperative.  Head:  Normocephalic and atraumatic. Eyes:  Conjuctiva clear without scleral icterus. Lungs: clear bilaterally Cardiac: S1 S2 present without murmurs Abdomen:  +BS, soft, non-tender and non-distended. No rebound or guarding. No HSM or masses noted. Rectal: lax sphincter tone, erythema posterior and anterior midline, mildly excoriated, no obvious mass on exam Msk:  Symmetrical without gross deformities. Normal posture. Extremities:  Without edema. Neurologic:  Alert and  oriented x4 Psych:  Alert and cooperative. Normal mood and affect.  ASSESSMENT: Marc [name] is a 64 y.o. male presenting today with history of Hep C genotype 1a, elastography F3/F4, s/p treatment with Harvoni several years ago. He still needs post-treatment RNA to document SVR. With F3/F4 score historically, would consider EGD for screening purposes. Now with symptomatic hemorrhoids, reporting what sounds to be prolapsing hemorrhoids. I did not appreciate this on exam. Intermittent rectal bleeding likely due to internal hemorrhoids in setting of constipation.  Constipation: samples of Linzess 290 mcg provided. Call with progress report.  Rectal bleeding and discomfort: Anusol cream sent in BID. Pursue colonoscopy in near future. He also has a history of adenomas and due for surveillance.   History of Hep C with fibrosis score F3/F4 on elastography: still needs post-treatment RNA  to document SVR. Update [name] elastography. Consider EGD at time of colonoscopy for variceal screening purposes.     PLAN:   Proceed with colonoscopy +/- EGD  with Dr. Oneida Alar in the near future. The risks, benefits, and alternatives have been discussed in detail with the patient. They state understanding and desire to proceed. Propofol due to polypharmacy.  Check HCV RNA now  [name] elastography ordered  Anusol BID  Linzess 290 mcg daily, samples provided.   Return in 4-6 months.   Annitta Needs, PhD, ANP-BC [facility]

## 2019-09-12 LAB — HCV RNA, QUANT REAL-TIME PCR W/REFLEX
HCV RNA, PCR, QN (Log): <1.18 LogIU/mL
HCV RNA, PCR, QN: <15 IU/mL

## 2019-09-12 NOTE — Progress Notes (Signed)
Cc'ed to pcp °

## 2019-09-13 ENCOUNTER — Other Ambulatory Visit: Payer: Self-pay

## 2019-09-13 MED ORDER — PEG 3350-KCL-NA BICARB-NACL 420 G PO SOLR: 4000.0000 mL | ORAL | 0 refills | Status: DC

## 2019-09-14 ENCOUNTER — Other Ambulatory Visit: Payer: Self-pay

## 2019-09-14 ENCOUNTER — Ambulatory Visit (HOSPITAL_COMMUNITY)
Admission: RE | Admit: 2019-09-14 | Discharge: 2019-09-14 | Disposition: A | Discharge status: Home / Self Care | Payer: Medicare HMO | Source: Ambulatory Visit | Attending: Gastroenterology | Admitting: Gastroenterology

## 2019-09-14 ENCOUNTER — Encounter: Payer: Self-pay | Admitting: Family Medicine

## 2019-09-14 ENCOUNTER — Ambulatory Visit (INDEPENDENT_AMBULATORY_CARE_PROVIDER_SITE_OTHER): Payer: Medicare HMO | Admitting: Family Medicine

## 2019-09-14 VITALS — BP 123/71 | HR 69 | Ht 70.0 in | Wt 167.0 lb

## 2019-09-14 DIAGNOSIS — M109 Gout, unspecified: Secondary | ICD-10-CM

## 2019-09-14 DIAGNOSIS — Z8619 Personal history of other infectious and parasitic diseases: Secondary | ICD-10-CM | POA: Diagnosis not present

## 2019-09-14 DIAGNOSIS — B1920 Unspecified viral hepatitis C without hepatic coma: Secondary | ICD-10-CM | POA: Diagnosis not present

## 2019-09-14 HISTORY — DX: Gout, unspecified: M10.9

## 2019-09-14 MED ORDER — COLCHICINE 0.6 MG PO TABS: ORAL_TABLET | ORAL | 0 refills | Status: DC

## 2019-09-14 NOTE — Patient Instructions (Signed)
Happy New Year! May you have a year filled with hope, love, happiness and laughter.  I appreciate the opportunity to provide you with care for your health and wellness. Today we discussed: Gout  Follow up: As previously scheduled for February 23  No labs or referrals today  Please take colchicine as directed.  If you have any issues or you do not feel any better after completing medication please reach back out.  Make sure you are mindful of foods that can cause gout flareups.  Please continue to practice social distancing to keep you, your family, and our community safe.  If you must go out, please wear a mask and practice good handwashing.  It was a pleasure to see you and I look forward to continuing to work together on your health and well-being. Please do not hesitate to call the office if you need care or have questions about your care.  Have a wonderful day and week. With Gratitude, Cherly Beach, DNP, AGNP-BC   Low-Purine Eating Plan A low-purine eating plan involves making food choices to limit your intake of purine. Purine is a kind of uric acid. Too much uric acid in your blood can cause certain conditions, such as gout and kidney stones. Eating a low-purine diet can help control these conditions. What are tips for following this plan? Reading food labels   Avoid foods with saturated or Trans fat.  Check the ingredient list of grains-based foods, such as bread and cereal, to make sure that they contain whole grains.  Check the ingredient list of sauces or soups to make sure they do not contain meat or fish.  When choosing soft drinks, check the ingredient list to make sure they do not contain high-fructose corn syrup. Shopping  Buy plenty of fresh fruits and vegetables.  Avoid buying canned or fresh fish.  Buy dairy products labeled as low-fat or nonfat.  Avoid buying premade or processed foods. These foods are often high in fat, salt (sodium), and added  sugar. Cooking  Use olive oil instead of butter when cooking. Oils like olive oil, canola oil, and sunflower oil contain healthy fats. Meal planning  Learn which foods do or do not affect you. If you find out that a food tends to cause your gout symptoms to flare up, avoid eating that food. You can enjoy foods that do not cause problems. If you have any questions about a food item, talk with your dietitian or health care provider.  Limit foods high in fat, especially saturated fat. Fat makes it harder for your body to get rid of uric acid.  Choose foods that are lower in fat and are lean sources of protein. General guidelines  Limit alcohol intake to no more than 1 drink a day for nonpregnant women and 2 drinks a day for men. One drink equals 12 oz of beer, 5 oz of wine, or 1 oz of hard liquor. Alcohol can affect the way your body gets rid of uric acid.  Drink plenty of water to keep your urine clear or pale yellow. Fluids can help remove uric acid from your body.  If directed by your health care provider, take a vitamin C supplement.  Work with your health care provider and dietitian to develop a plan to achieve or maintain a healthy weight. Losing weight can help reduce uric acid in your blood. What foods are recommended? The items listed may not be a complete list. Talk with your dietitian about what dietary  choices are best for you. Foods low in purines Foods low in purines do not need to be limited. These include:  All fruits.  All low-purine vegetables, pickles, and olives.  Breads, pasta, rice, cornbread, and popcorn. Cake and other baked goods.  All dairy foods.  Eggs, nuts, and nut butters.  Spices and condiments, such as salt, herbs, and vinegar.  Plant oils, butter, and margarine.  Water, sugar-free soft drinks, tea, coffee, and cocoa.  Vegetable-based soups, broths, sauces, and gravies. Foods moderate in purines Foods moderate in purines should be limited to  the amounts listed.   cup of asparagus, cauliflower, spinach, mushrooms, or green peas, each day.  2/3 cup uncooked oatmeal, each day.   cup dry wheat bran or wheat germ, each day.  2-3 ounces of meat or poultry, each day.  4-6 ounces of shellfish, such as crab, lobster, oysters, or shrimp, each day.  1 cup cooked beans, peas, or lentils, each day.  Soup, broths, or bouillon made from meat or fish. Limit these foods as much as possible. What foods are not recommended? The items listed may not be a complete list. Talk with your dietitian about what dietary choices are best for you. Limit your intake of foods high in purines, including:  Beer and other alcohol.  Meat-based gravy or sauce.  Canned or fresh fish, such as: ? Anchovies, sardines, herring, and tuna. ? Mussels and scallops. ? Codfish, trout, and haddock.  Berniece Salines.  Organ meats, such as: ? Liver or kidney. ? Tripe. ? Sweetbreads (thymus gland or pancreas).  Wild Clinical biochemist.  Yeast or yeast extract supplements.  Drinks sweetened with high-fructose corn syrup. Summary  Eating a low-purine diet can help control conditions caused by too much uric acid in the body, such as gout or kidney stones.  Choose low-purine foods, limit alcohol, and limit foods high in fat.  You will learn over time which foods do or do not affect you. If you find out that a food tends to cause your gout symptoms to flare up, avoid eating that food. This information is not intended to replace advice given to you by your health care provider. Make sure you discuss any questions you have with your health care provider. Document Revised: 07/09/2017 Document Reviewed: 09/09/2016 Elsevier Patient Education  2020 [employer].

## 2019-09-14 NOTE — Assessment & Plan Note (Signed)
Right.  Toe gout.  Previous history of this per him.  Will prescribe colchicine to see if that is helpful.  Advised for him to follow-up if this does not help.Patient acknowledged agreement and understanding of the plan.   Patient acknowledged agreement and understanding of the plan.

## 2019-09-14 NOTE — Progress Notes (Signed)
Virtual Visit via Telephone Note   This visit type was conducted due to national recommendations for restrictions regarding the COVID-19 Pandemic (e.g. social distancing) in an effort to limit this patient's exposure and mitigate transmission in our community.  Due to his co-morbid illnesses, this patient is at least at moderate risk for complications without adequate follow up.  This format is felt to be most appropriate for this patient at this time.  The patient did not have access to video technology/had technical difficulties with video requiring transitioning to audio format only (telephone).  All issues noted in this document were discussed and addressed.  No physical exam could be performed with this format.   Evaluation Performed:  Follow-up visit  Date:  09/14/2019   ID:  Marc [name], Marc [name] [birth year], MRN [id number]  Patient Location: Home Provider Location: Office  Location of Patient: Home Location of Provider: Telehealth Consent was obtain for visit to be over via telehealth. I verified that I am speaking with the correct person using two identifiers.  PCP:  Fayrene Helper, MD   Chief Complaint:  Gout   History of Present Illness:    Marc [name] is a 64 y.o. male with with 24-hour onset of gout.  Reports he has had this in the past.  But he does not remember any medications that he was previously on.  Reports that he has right sided great toe swelling, redness, warmth.  Difficulty wearing socks and shoes.  Extreme discomfort and pain that is stronger than his chronic pain that he deals with.  He denies having any other symptoms or issues to discuss today.  The patient does not have symptoms concerning for COVID-19 infection (fever, chills, cough, or new shortness of breath).   Past Medical, Surgical, Social History, Allergies, and Medications have been Reviewed.  Past Medical History:  Diagnosis Date  . Acute encephalopathy 12/29/2017   Hospitalized 5/22 to  5/23 with acute encephalopathy, unclear etiology  . Allergy   . Anxiety   . Arthritis   . Back problem   . Depression   . GERD (gastroesophageal reflux disease)   . Hepatitis C 1979  . Hepatitis C reactive    LIVER BX 2008-CHRONIC ACTIVE HEPATITIS  . HTN (hypertension)   . Hx of adenomatous colonic polyps 2008   due for surveillance 2017  . Hypercholesterolemia   . IBS (irritable bowel syndrome)   . Knee pain   . Leukopenia 11/06/2015  . Normal cardiac stress test 05/25/2011   [facility] [place]  . Normal echocardiogram 05/25/2011   EF 55%, mild TR  . Substance abuse ([place])    narcotic addiction  . Thrombocytopenia ([place]) 11/06/2015   Past Surgical History:  Procedure Laterality Date  . APPENDECTOMY    . CARPAL TUNNEL RELEASE     rt  . COLONOSCOPY  2008 SLF ARS D100 V8 PHEN 12.5   2 SIMPLE ADENOMAS (< 1 CM)  . ESOPHAGOGASTRODUODENOSCOPY  04/26/2012   Dr. Oneida Alar: Non-erosive gastritis (inflammation) was found in the gastric antrum but no H.pylori; multiple biopsies (duodenal bx negative for Celiac)/ The mucosa of the esophagus appeared normal  . EUS  09/08/2012   Dr. Ardis Hughs: CBD dilated but no stones. Query secondary to Sphincter of Oddi stenosis, ?dysfunction, but clinically without symptoms  . HARDWARE REMOVAL Right 02/12/2014   Procedure: HARDWARE REMOVAL;  Surgeon: Jolyn Nap, MD;  Location: [place];  Service: Orthopedics;  Laterality: Right;  . KNEE  ARTHROSCOPY    . Neurostimulator implant    . right ring finger    . spinal stenosis, had screws put in neck  04/11/2009   DR KRITZER  . TONSILLECTOMY    . ULNA OSTEOTOMY Right 02/12/2014   Procedure: RIGHT ULNAR SHORTENING AND OSTEOTOMY ;  Surgeon: Jolyn Nap, MD;  Location: [place];  Service: Orthopedics;  Laterality: Right;  . ULNAR NERVE TRANSPOSITION    . UPPER GASTROINTESTINAL ENDOSCOPY  2008 SLF ABD PAIN WEIGHT LOSS d100 v8 phen 12.5   NL  . WRIST SURGERY Right jan  2015   Dr. Edmonia Lynch     Current Meds  Medication Sig  . acetaminophen (TYLENOL) 500 MG tablet Take 500 mg by mouth as needed.  . Ascorbic Acid (VITAMIN C PO) Take 1 Dose by mouth daily.  Marland Kitchen aspirin EC 81 MG tablet Take 81 mg by mouth daily.  . Cholecalciferol (VITAMIN D3 PO) Take 1 Dose by mouth daily.  . cloNIDine (CATAPRES) 0.3 MG tablet Take 1 tablet (0.3 mg total) by mouth daily.  Marland Kitchen dimenhyDRINATE (DRAMAMINE) 50 MG tablet Take 50 mg by mouth every 8 (eight) hours as needed for itching.  . hydrocortisone (ANUSOL-HC) 2.5 % rectal cream Place 1 application rectally 2 (two) times daily.  . mirtazapine (REMERON) 15 MG tablet Take 1 tablet (15 mg total) by mouth at bedtime.  . Multiple Vitamins-Minerals (MULTIVITAMIN ADULTS 50+) TABS Take 1 tablet by mouth daily.  . Omega-3 Fatty Acids (FISH OIL) 1000 MG CAPS Take 1 capsule by mouth daily.  . polyethylene glycol-electrolytes (TRILYTE) 420 g solution Take 4,000 mLs by mouth as directed.  . prazosin (MINIPRESS) 5 MG capsule Take 1 capsule (5 mg total) by mouth at bedtime.  Marland Kitchen QUEtiapine (SEROQUEL) 50 MG tablet Take 1 tablet (50 mg total) by mouth 4 (four) times daily.  . Turmeric 400 MG CAPS Take by mouth 3 (three) times daily.     Allergies:   Levofloxacin, Ciprofloxacin, and Sulfa antibiotics   ROS:   Please see the history of present illness.    All other systems reviewed and are negative.   Labs/Other Tests and Data Reviewed:    Recent Labs: 05/03/2019: Hemoglobin 13.9; Platelets 150; TSH 2.01 08/16/2019: ALT 22; BUN 17; Creat 1.11; Potassium 4.2; Sodium 140   Recent Lipid Panel Lab Results  Component Value Date/Time   CHOL 189 08/16/2019 02:29 PM   TRIG 65 08/16/2019 02:29 PM   HDL 44 08/16/2019 02:29 PM   CHOLHDL 4.3 08/16/2019 02:29 PM   LDLCALC 129 (H) 08/16/2019 02:29 PM    Wt Readings from Last 3 Encounters:  09/07/19 168 lb 6.4 oz (76.4 kg)  08/15/19 170 lb (77.1 kg)  04/11/19 174 lb 0.6 oz (78.9 kg)      Objective:    Vital Signs:  There were no vitals taken for this visit.   VITAL SIGNS:  reviewed GEN:  alert and oriented  RESPIRATORY:  no shortness of breath in conversation  PSYCH:  normal affect and mood  ASSESSMENT & PLAN:    1. Acute gout involving toe of right foot, unspecified cause - colchicine 0.6 MG tablet; Day 1: Take 1.2mg  (2 tablets) then take 0.6mg  (1 tablet) an hour later. Day 2-4: Take 0.6mg  (1 tablet) twice daily or until flare resolves.  Dispense: 11 tablet; Refill: 0   Time:   Today, I have spent 10 minutes with the patient with telehealth technology discussing the above problems.  Medication Adjustments/Labs and Tests Ordered: Current medicines are reviewed at length with the patient today.  Concerns regarding medicines are outlined above.   Tests Ordered: No orders of the defined types were placed in this encounter.   Medication Changes: No orders of the defined types were placed in this encounter.   Disposition:  Follow up 10/03/2019  Signed, Perlie Mayo, NP  09/14/2019 1:23 PM     [facility]

## 2019-09-20 ENCOUNTER — Other Ambulatory Visit: Payer: Self-pay

## 2019-09-20 ENCOUNTER — Ambulatory Visit (HOSPITAL_COMMUNITY): Payer: Medicare HMO | Admitting: Psychiatry

## 2019-09-21 ENCOUNTER — Encounter (HOSPITAL_COMMUNITY): Payer: Self-pay | Admitting: Psychiatry

## 2019-09-21 ENCOUNTER — Other Ambulatory Visit: Payer: Self-pay

## 2019-09-21 ENCOUNTER — Ambulatory Visit (INDEPENDENT_AMBULATORY_CARE_PROVIDER_SITE_OTHER): Payer: Medicare HMO | Admitting: Psychiatry

## 2019-09-21 DIAGNOSIS — F331 Major depressive disorder, recurrent, moderate: Secondary | ICD-10-CM | POA: Diagnosis not present

## 2019-09-21 DIAGNOSIS — F4310 Post-traumatic stress disorder, unspecified: Secondary | ICD-10-CM

## 2019-09-21 MED ORDER — PRAZOSIN HCL 5 MG PO CAPS: 5.0000 mg | ORAL_CAPSULE | Freq: Every day | ORAL | 1 refills | Status: DC

## 2019-09-21 MED ORDER — MIRTAZAPINE 15 MG PO TABS: 15.0000 mg | ORAL_TABLET | Freq: Every day | ORAL | 2 refills | Status: DC

## 2019-09-21 MED ORDER — QUETIAPINE FUMARATE 50 MG PO TABS: 50.0000 mg | ORAL_TABLET | Freq: Four times a day (QID) | ORAL | 2 refills | Status: DC

## 2019-09-21 NOTE — Progress Notes (Signed)
Virtual Visit via Telephone Note  I connected with Marc [name] on 09/21/19 at 11:00 AM EST by telephone and verified that I am speaking with the correct person using two identifiers.   I discussed the limitations, risks, security and privacy concerns of performing an evaluation and management service by telephone and the availability of in person appointments. I also discussed with the patient that there may be a patient responsible charge related to this service. The patient expressed understanding and agreed to proceed.    I discussed the assessment and treatment plan with the patient. The patient was provided an opportunity to ask questions and all were answered. The patient agreed with the plan and demonstrated an understanding of the instructions.   The patient was advised to call back or seek an in-person evaluation if the symptoms worsen or if the condition fails to improve as anticipated.  I provided 15 minutes of non-face-to-face time during this encounter.   [name], MD  [facility] MD/PA/NP OP Progress Note  09/21/2019 11:10 AM Marc [name]  MRN:  [id number]  Chief Complaint:  Chief Complaint    Depression; Anxiety; Follow-up     HPI: This patient is a 64 year old divorced white male who lives alone in [place].  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  He has 1 son in [place].  The patient returns for follow-up regarding mood swings and depression.  He also has a history of alcoholism but is currently very active in [place].  He states for the most part he is doing fairly well.  He still goes to meetings every day and has made a lot of friends there.  He tells me they do not always wear masks at night explained that this is a risky situation for people catching coronavirus and urged him to wear masks there.  Overall his mood has been pretty stable.  He still has nightmares at times but thinks the medication has helped diminish them.  The  Seroquel continues to help with his mood swings and anxiety.  He has not relapsed into alcohol use for 9 months now.  He denies any thoughts of self-harm or suicide. Visit Diagnosis:    ICD-10-CM   1. Major depressive disorder, recurrent, moderate (HCC)  F33.1   2. PTSD (post-traumatic stress disorder)  F43.10 prazosin (MINIPRESS) 5 MG capsule    Past Psychiatric History: Prior hospitalization for alcohol detox, the last 1 being about 9 months ago  Past Medical History:  Past Medical History:  Diagnosis Date  . Acute encephalopathy 12/29/2017   Hospitalized 5/22 to 5/23 with acute encephalopathy, unclear etiology  . Allergy   . Anxiety   . Arthritis   . Back problem   . Depression   . Epigastric pain 07/14/2012  . GERD (gastroesophageal reflux disease)   . Hepatitis C 1979  . Hepatitis C reactive    LIVER BX 2008-CHRONIC ACTIVE HEPATITIS  . HTN (hypertension)   . Hx of adenomatous colonic polyps 2008   due for surveillance 2017  . Hypercholesterolemia   . IBS (irritable bowel syndrome)   . Knee pain   . Leukopenia 11/06/2015  . Normal cardiac stress test 05/25/2011   [facility]  . Normal echocardiogram 05/25/2011   EF 55%, mild TR  . RUQ pain 07/14/2012  . Substance abuse ([place])    narcotic addiction  . Thrombocytopenia ([place]) 11/06/2015    Past Surgical History:  Procedure Laterality Date  .  APPENDECTOMY    . CARPAL TUNNEL RELEASE     rt  . COLONOSCOPY  2008 SL[facility] ARS D100 V8 PHEN 12.5   2 SIMPLE ADENOMAS (< 1 CM)  . ESOPHAGOGASTRODUODENOSCOPY  04/26/2012   Dr. Oneida Alar: Non-erosive gastritis (inflammation) was found in the gastric antrum but no H.pylori; multiple biopsies (duodenal bx negative for Celiac)/ The mucosa of the esophagus appeared normal  . EUS  09/08/2012   Dr. Ardis Hughs: CBD dilated but no stones. Query secondary to Sphincter of Oddi stenosis, ?dysfunction, but clinically without symptoms  . HARDWARE REMOVAL Right 02/12/2014   Procedure: HARDWARE  REMOVAL;  Surgeon: Jolyn Nap, MD;  Location: [facility];  Service: Orthopedics;  Laterality: Right;  . KNEE ARTHROSCOPY    . Neurostimulator implant    . right ring finger    . spinal stenosis, had screws put in neck  04/11/2009   DR KRITZER  . TONSILLECTOMY    . ULNA OSTEOTOMY Right 02/12/2014   Procedure: RIGHT ULNAR SHORTENING AND OSTEOTOMY ;  Surgeon: Jolyn Nap, MD;  Location: [facility];  Service: Orthopedics;  Laterality: Right;  . ULNAR NERVE TRANSPOSITION    . UPPER GASTROINTESTINAL ENDOSCOPY  2008 SLF ABD PAIN WEIGHT LOSS d100 v8 phen 12.5   NL  . WRIST SURGERY Right jan 2015   Dr. Edmonia Lynch    Family Psychiatric History: See below  Family History:  Family History  Problem Relation Age of Onset  . Bladder Cancer Mother        rare form   . Cancer Sister        Metastatic abdominal  . Emotional abuse Sister   . Stroke Sister   . Emotional abuse Sister   . Heart failure Father   . Stroke Father   . Alcohol abuse Father   . Dementia Paternal Aunt   . Alcohol abuse Paternal Uncle   . Dementia Paternal Uncle   . Dementia Paternal Grandmother   . Stroke Paternal Grandmother   . ADD / ADHD Cousin   . Bipolar disorder Cousin   . Anxiety disorder Cousin   . Depression Cousin        Committed suicide  . Alcohol abuse Cousin   . OCD Cousin   . Paranoid behavior Cousin   . Brain cancer Maternal Grandfather   . Heart defect Other        FAMILY HX  . Colon cancer Maternal Uncle   . Colon polyps Neg Hx   . Drug abuse Neg Hx   . Schizophrenia Neg Hx   . Seizures Neg Hx   . Sexual abuse Neg Hx   . Physical abuse Neg Hx     Social History:  Social History   Socioeconomic History  . Marital status: Divorced    Spouse name: Not on file  . Number of children: Not on file  . Years of education: Not on file  . Highest education level: Not on file  Occupational History  . Not on file  Tobacco Use  . Smoking status:  Former Smoker    Packs/day: 1.50    Years: 8.00    Pack years: 12.00    Types: Cigarettes    Quit date: 08/13/1986    Years since quitting: 33.1  . Smokeless tobacco: Never Used  . Tobacco comment: quit 20 + yrs ago  Substance and Sexual Activity  . Alcohol use: No    Alcohol/week: 0.0 standard drinks  . Drug use:  No  . Sexual activity: Never  Other Topics Concern  . Not on file  Social History Narrative  . Not on file   Social Determinants of Health   Financial Resource Strain: Low Risk   . Difficulty of Paying Living Expenses: Not hard at all  Food Insecurity: No Food Insecurity  . Worried About Charity fundraiser in the Last Year: Never true  . Ran Out of Food in the Last Year: Never true  Transportation Needs: No Transportation Needs  . Lack of Transportation (Medical): No  . Lack of Transportation (Non-Medical): No  Physical Activity: Inactive  . Days of Exercise per Week: 0 days  . Minutes of Exercise per Session: 0 min  Stress: Stress Concern Present  . Feeling of Stress : Very much  Social Connections: Moderately Isolated  . Frequency of Communication with Friends and Family: More than three times a week  . Frequency of Social Gatherings with Friends and Family: Three times a week  . Attends Religious Services: Never  . Active Member of Clubs or Organizations: No  . Attends Archivist Meetings: Never  . Marital Status: Divorced    Allergies:  Allergies  Allergen Reactions  . Levofloxacin Nausea Only and Other (See Comments)    "Creepy" feeling, skin didn't feel right, sort of itchy.  . Ciprofloxacin     Other reaction(s): Sweating (intolerance)  . Sulfa Antibiotics     Metabolic Disorder Labs: Lab Results  Component Value Date   HGBA1C 5.5 12/30/2017   MPG 111.15 12/30/2017   MPG 111 10/22/2016   No results found for: PROLACTIN Lab Results  Component Value Date   CHOL 189 08/16/2019   TRIG 65 08/16/2019   HDL 44 08/16/2019   CHOLHDL  4.3 08/16/2019   VLDL 27 10/22/2016   LDLCALC 129 (H) 08/16/2019   LDLCALC 167 (H) 05/03/2019   Lab Results  Component Value Date   TSH 2.01 05/03/2019   TSH 2.056 12/30/2017    Therapeutic Level Labs: No results found for: LITHIUM No results found for: VALPROATE No components found for:  CBMZ  Current Medications: Current Outpatient Medications  Medication Sig Dispense Refill  . acetaminophen (TYLENOL) 500 MG tablet Take 500 mg by mouth as needed.    . Ascorbic Acid (VITAMIN C PO) Take 1 Dose by mouth daily.    Marland Kitchen aspirin EC 81 MG tablet Take 81 mg by mouth daily.    . Cholecalciferol (VITAMIN D3 PO) Take 1 Dose by mouth daily.    . cloNIDine (CATAPRES) 0.3 MG tablet Take 1 tablet (0.3 mg total) by mouth daily. 30 tablet 3  . colchicine 0.6 MG tablet Day 1: Take 1.2mg  (2 tablets) then take 0.6mg  (1 tablet) an hour later. Day 2-4: Take 0.6mg  (1 tablet) twice daily or until flare resolves. 11 tablet 0  . dimenhyDRINATE (DRAMAMINE) 50 MG tablet Take 50 mg by mouth every 8 (eight) hours as needed for itching.    . fluorometholone (FML) 0.1 % ophthalmic suspension 1 drop 3 (three) times daily.    . hydrocortisone (ANUSOL-HC) 2.5 % rectal cream Place 1 application rectally 2 (two) times daily. 30 g 1  . mirtazapine (REMERON) 15 MG tablet Take 1 tablet (15 mg total) by mouth at bedtime. 30 tablet 2  . Multiple Vitamins-Minerals (MULTIVITAMIN ADULTS 50+) TABS Take 1 tablet by mouth daily.    . Omega-3 Fatty Acids (FISH OIL) 1000 MG CAPS Take 1 capsule by mouth daily.    Marland Kitchen  polyethylene glycol-electrolytes (TRILYTE) 420 g solution Take 4,000 mLs by mouth as directed. 4000 mL 0  . prazosin (MINIPRESS) 5 MG capsule Take 1 capsule (5 mg total) by mouth at bedtime. 30 capsule 1  . QUEtiapine (SEROQUEL) 50 MG tablet Take 1 tablet (50 mg total) by mouth 4 (four) times daily. 120 tablet 2  . Turmeric 400 MG CAPS Take by mouth 3 (three) times daily.     No current facility-administered medications  for this visit.     Musculoskeletal: Strength & Muscle Tone: within normal limits Gait & Station: normal Patient leans: N/A  Psychiatric Specialty Exam: Review of Systems  Musculoskeletal: Positive for arthralgias and joint swelling.  All other systems reviewed and are negative.   There were no vitals taken for this visit.There is no height or weight on file to calculate BMI.  General Appearance: NA  Eye Contact:  NA  Speech:  Clear and Coherent  Volume:  Normal  Mood:  Euthymic  Affect:  NA  Thought Process:  Goal Directed  Orientation:  Full (Time, Place, and Person)  Thought Content: Rumination   Suicidal Thoughts:  No  Homicidal Thoughts:  No  Memory:  Immediate;   Good Recent;   Good Remote;   Fair  Judgement:  Good  Insight:  Fair  Psychomotor Activity:  Normal  Concentration:  Concentration: Good and Attention Span: Good  Recall:  Good  Fund of Knowledge: Good  Language: Good  Akathisia:  No  Handed:  Right  AIMS (if indicated): not done  Assets:  Communication Skills Desire for Improvement Resilience Social Support Talents/Skills  ADL's:  Intact  Cognition: WNL  Sleep:  Fair   Screenings: PHQ2-9     Office Visit from 09/14/2019 in [facility] Office Visit from 08/15/2019 in [facility] Office Visit from 04/11/2019 in [facility] Office Visit from 02/28/2019 in [facility] from 02/08/2019 in [facility]  PHQ-2 Total Score  0  1  3  4  3   PHQ-9 Total Score  --  10  14  20  19        Assessment and Plan: This patient is a 64 year old male with a long history of depression and alcohol abuse.  He seems to be doing better now that he is taking his medications earlier in the evening.  He will continue mirtazapine 15 mg at bedtime for depression, prazosin 5 mg at bedtime for nightmares and Seroquel 50 mg 4 times daily for mood stabilization.  He will return to see me in 3 months   [name], MD 09/21/2019, 11:10 AM

## 2019-09-26 NOTE — Progress Notes (Signed)
ON RECALL  °

## 2019-10-03 ENCOUNTER — Ambulatory Visit: Payer: Medicare HMO | Admitting: Family Medicine

## 2019-10-05 ENCOUNTER — Ambulatory Visit: Payer: Medicare HMO | Admitting: Family Medicine

## 2019-10-09 ENCOUNTER — Telehealth: Payer: Self-pay | Admitting: *Deleted

## 2019-10-09 NOTE — Telephone Encounter (Signed)
PA done via [employer]. Tracking # for [id number], tracking # for TCS [id number]. Clinicals faxed

## 2019-10-12 ENCOUNTER — Encounter: Payer: Self-pay | Admitting: Family Medicine

## 2019-10-12 ENCOUNTER — Ambulatory Visit (INDEPENDENT_AMBULATORY_CARE_PROVIDER_SITE_OTHER): Payer: Medicare HMO | Admitting: Family Medicine

## 2019-10-12 ENCOUNTER — Other Ambulatory Visit: Payer: Self-pay

## 2019-10-12 VITALS — BP 150/80 | HR 80 | Temp 96.4°F | Resp 15 | Ht 70.0 in | Wt 166.1 lb

## 2019-10-12 DIAGNOSIS — J302 Other seasonal allergic rhinitis: Secondary | ICD-10-CM | POA: Diagnosis not present

## 2019-10-12 DIAGNOSIS — I10 Essential (primary) hypertension: Secondary | ICD-10-CM

## 2019-10-12 DIAGNOSIS — J3489 Other specified disorders of nose and nasal sinuses: Secondary | ICD-10-CM | POA: Diagnosis not present

## 2019-10-12 MED ORDER — FLUTICASONE PROPIONATE 50 MCG/ACT NA SUSP: 2.0000 | Freq: Every day | NASAL | 1 refills | Status: DC

## 2019-10-12 MED ORDER — LISINOPRIL 10 MG PO TABS: 10.0000 mg | ORAL_TABLET | Freq: Every day | ORAL | 1 refills | Status: DC

## 2019-10-12 MED ORDER — CLONIDINE HCL 0.1 MG PO TABS: 0.1000 mg | ORAL_TABLET | Freq: Three times a day (TID) | ORAL | 0 refills | Status: DC

## 2019-10-12 NOTE — Patient Instructions (Signed)
I appreciate the opportunity to provide you with care for your health and wellness. Today we discussed: blood pressure and allergies  Follow up: 4 weeks in office BP   No labs or referrals today  Medication changes see med list.  Please continue to practice social distancing to keep you, your family, and our community safe.  If you must go out, please wear a mask and practice good handwashing.  It was a pleasure to see you and I look forward to continuing to work together on your health and well-being. Please do not hesitate to call the office if you need care or have questions about your care.  Have a wonderful day and week. With Gratitude, Cherly Beach, DNP, AGNP-BC

## 2019-10-12 NOTE — Progress Notes (Signed)
Subjective:  Patient ID: Marc [name], male    DOB: [birth year]  Age: 64 y.o. MRN: [id number]  CC:  Chief Complaint  Patient presents with  . Hypertension    follow up bp check, also nasal drainage is no better, has neti pot, wants to know if it is ok to use      HPI  HPI Marc [name] is a 64 year old male patient who presents today for follow-up of blood pressure.  Dr. Moshe Cipro had increased him to 0.3 clonidine to be taken by mouth daily from 0.2 mg.  He reports that he just feels like a zombie when he wakes up in the morning and wants to see if he can go back to taking it several times during the day.  He also reports that he has had some nasal drainage and wants to know what he can take for that.  He has had his first vaccination of the Covid vaccine.  His next will be on March 25.  He reports that he tolerated the first well.  Today patient denies signs and symptoms of COVID 19 infection including fever, chills, cough, shortness of breath, and headache. Past Medical, Surgical, Social History, Allergies, and Medications have been Reviewed.   Past Medical History:  Diagnosis Date  . Acute encephalopathy 12/29/2017   Hospitalized 5/22 to 5/23 with acute encephalopathy, unclear etiology  . Allergy   . Anxiety   . Arthritis   . Back problem   . Depression   . Epigastric pain 07/14/2012  . GERD (gastroesophageal reflux disease)   . Hepatitis C 1979  . Hepatitis C reactive    LIVER BX 2008-CHRONIC ACTIVE HEPATITIS  . HTN (hypertension)   . Hx of adenomatous colonic polyps 2008   due for surveillance 2017  . Hypercholesterolemia   . IBS (irritable bowel syndrome)   . Knee pain   . Leukopenia 11/06/2015  . Normal cardiac stress test 05/25/2011   [facility]  . Normal echocardiogram 05/25/2011   EF 55%, mild TR  . RUQ pain 07/14/2012  . Substance abuse ([place])    narcotic addiction  . Thrombocytopenia ([place]) 11/06/2015    Current Meds  Medication Sig  .  acetaminophen (TYLENOL) 500 MG tablet Take 500 mg by mouth as needed.  . Ascorbic Acid (VITAMIN C PO) Take 1 Dose by mouth daily.  Marland Kitchen aspirin EC 81 MG tablet Take 81 mg by mouth daily.  . Cholecalciferol (VITAMIN D3 PO) Take 1 Dose by mouth daily.  . cloNIDine (CATAPRES) 0.1 MG tablet Take 1 tablet (0.1 mg total) by mouth 3 (three) times daily.  . colchicine 0.6 MG tablet Day 1: Take 1.2mg  (2 tablets) then take 0.6mg  (1 tablet) an hour later. Day 2-4: Take 0.6mg  (1 tablet) twice daily or until flare resolves.  . dimenhyDRINATE (DRAMAMINE) 50 MG tablet Take 50 mg by mouth every 8 (eight) hours as needed for itching.  . fluorometholone (FML) 0.1 % ophthalmic suspension 1 drop 3 (three) times daily.  . hydrocortisone (ANUSOL-HC) 2.5 % rectal cream Place 1 application rectally 2 (two) times daily.  . mirtazapine (REMERON) 15 MG tablet Take 1 tablet (15 mg total) by mouth at bedtime.  . Multiple Vitamins-Minerals (MULTIVITAMIN ADULTS 50+) TABS Take 1 tablet by mouth daily.  . Omega-3 Fatty Acids (FISH OIL) 1000 MG CAPS Take 1 capsule by mouth daily.  . polyethylene glycol-electrolytes (TRILYTE) 420 g solution Take 4,000 mLs by mouth as directed.  . prazosin (MINIPRESS)  5 MG capsule Take 1 capsule (5 mg total) by mouth at bedtime.  Marland Kitchen QUEtiapine (SEROQUEL) 50 MG tablet Take 1 tablet (50 mg total) by mouth 4 (four) times daily.  . Turmeric 400 MG CAPS Take by mouth 3 (three) times daily.  . [DISCONTINUED] cloNIDine (CATAPRES) 0.3 MG tablet Take 1 tablet (0.3 mg total) by mouth daily.    ROS:  Review of Systems  Constitutional: Negative.   HENT: Positive for congestion.   Eyes: Negative.   Respiratory: Negative.   Cardiovascular: Negative.   Gastrointestinal: Negative.   Genitourinary: Negative.   Musculoskeletal: Negative.   Skin: Negative.   Neurological: Negative.   Endo/Heme/Allergies: Negative.   Psychiatric/Behavioral: Negative.   All other systems reviewed and are negative.     Objective:   Today's Vitals: BP (!) 150/80   Pulse 80   Temp (!) 96.4 F (35.8 C) (Temporal)   Resp 15   Ht 5\' 10"  (1.778 m)   Wt 166 lb 1.9 oz (75.4 kg)   SpO2 98%   BMI 23.84 kg/m  Vitals with BMI 10/12/2019 09/14/2019 09/07/2019  Height 5\' 10"  5\' 10"  5\' 10"   Weight 166 lbs 2 oz 167 lbs 168 lbs 6 oz  BMI 23.84 [id number]  Systolic [id number]  Diastolic 80 71 71  Pulse 80 69 62  Some encounter information is confidential and restricted. Go to Review Flowsheets activity to see all data.     Physical Exam Vitals and nursing note reviewed.  Constitutional:      Appearance: Normal appearance. He is well-developed and well-groomed. He is obese.  HENT:     Head: Normocephalic and atraumatic.     Right Ear: External ear normal.     Left Ear: External ear normal.     Mouth/Throat:     Comments: Mask in place Eyes:     General:        Right eye: No discharge.        Left eye: No discharge.     Conjunctiva/sclera: Conjunctivae normal.  Cardiovascular:     Rate and Rhythm: Normal rate and regular rhythm.     Pulses: Normal pulses.     Heart sounds: Normal heart sounds.  Pulmonary:     Effort: Pulmonary effort is normal.     Breath sounds: Normal breath sounds.  Musculoskeletal:        General: Normal range of motion.     Cervical back: Normal range of motion and neck supple.  Skin:    General: Skin is warm.  Neurological:     General: No focal deficit present.     Mental Status: He is alert and oriented to person, place, and time.  Psychiatric:        Attention and Perception: Attention normal.        Mood and Affect: Mood normal.        Speech: Speech normal.        Behavior: Behavior normal. Behavior is cooperative.        Thought Content: Thought content normal.        Cognition and Memory: Cognition normal.        Judgment: Judgment normal.          Assessment   1. Essential hypertension   2. Nasal drainage   3. Seasonal allergies     Tests ordered  No orders of the defined types were placed in this encounter.    Plan: Please see assessment and plan per  problem list above.   Meds ordered this encounter  Medications  . cloNIDine (CATAPRES) 0.1 MG tablet    Sig: Take 1 tablet (0.1 mg total) by mouth 3 (three) times daily.    Dispense:  90 tablet    Refill:  0    Please discontinue clonidine 0.2 mg tablets effective 01/05/.2021    Order Specific Question:   Supervising Provider    Answer:   Tula Nakayama E [id number]  . lisinopril (ZESTRIL) 10 MG tablet    Sig: Take 1 tablet (10 mg total) by mouth daily.    Dispense:  30 tablet    Refill:  1    Order Specific Question:   Supervising Provider    Answer:   SIMPSON, MARGARET E [id number]  . fluticasone (FLONASE) 50 MCG/ACT nasal spray    Sig: Place 2 sprays into both nostrils daily.    Dispense:  16 g    Refill:  1    Order Specific Question:   Supervising Provider    Answer:   Fayrene Helper [id number]    Patient to follow-up in as scheduled.  Perlie Mayo, NP

## 2019-10-12 NOTE — Assessment & Plan Note (Signed)
Allergy related, flonase ordered

## 2019-10-12 NOTE — Assessment & Plan Note (Signed)
Reports wanting to go back on clonidine several times a day, reports it makes him too tired. I have switched this to 0.1 mg TID. BP not well controlled, adding lisinopril will recheck in 4 weeks. Continue DASH diet and 30 minutes of exercise on 5 days of the week.

## 2019-10-12 NOTE — Assessment & Plan Note (Signed)
Ordered Flonase to help with this.

## 2019-10-17 NOTE — Telephone Encounter (Signed)
PA approved. Auth# [id number] dates 12/05/2019-01/04/2020

## 2019-10-19 ENCOUNTER — Telehealth: Payer: Self-pay

## 2019-10-19 NOTE — Telephone Encounter (Signed)
Pt aware.

## 2019-10-19 NOTE — Telephone Encounter (Signed)
States he is very irritable and feels like he wants to just punch something since starting new med prescribed at last visit. States hes never felt this any or irritable. Only the clonidine was increased and flonase and lisinopril given. I advised to not use the flonase until he heard back from [name] because it does have steroids in it. Please advise

## 2019-10-19 NOTE — Telephone Encounter (Signed)
Pt is calling states that he is Very irritable since the new medication , wants a nurse to call

## 2019-11-09 ENCOUNTER — Other Ambulatory Visit: Payer: Self-pay

## 2019-11-09 ENCOUNTER — Encounter: Payer: Self-pay | Admitting: Family Medicine

## 2019-11-09 ENCOUNTER — Ambulatory Visit (INDEPENDENT_AMBULATORY_CARE_PROVIDER_SITE_OTHER): Payer: Medicare HMO | Admitting: Family Medicine

## 2019-11-09 VITALS — BP 120/74 | HR 67 | Temp 97.4°F | Resp 16 | Ht 70.0 in | Wt 163.0 lb

## 2019-11-09 DIAGNOSIS — J302 Other seasonal allergic rhinitis: Secondary | ICD-10-CM

## 2019-11-09 DIAGNOSIS — I10 Essential (primary) hypertension: Secondary | ICD-10-CM

## 2019-11-09 DIAGNOSIS — J3489 Other specified disorders of nose and nasal sinuses: Secondary | ICD-10-CM | POA: Diagnosis not present

## 2019-11-09 NOTE — Patient Instructions (Signed)
I appreciate the opportunity to provide you with care for your health and wellness. Today we discussed: sinus and bp  Follow up: 3 weeks for BP   No labs  ENT referral today  Stop morning dose of Clonidine for next week take BP goal of 120-130/80 If stable, send message and we will stop the afternoon dose and then follow up at appt.  Please continue to practice social distancing to keep you, your family, and our community safe.  If you must go out, please wear a mask and practice good handwashing.  It was a pleasure to see you and I look forward to continuing to work together on your health and well-being. Please do not hesitate to call the office if you need care or have questions about your care.  Have a wonderful day and week. With Gratitude, Cherly Beach, DNP, AGNP-BC

## 2019-11-09 NOTE — Progress Notes (Signed)
Subjective:  Patient ID: Marc [name], male    DOB: 1956-05-13  Age: 64 y.o. MRN: [id number]  CC:  Chief Complaint  Patient presents with  . Hypertension    follow up visit   . Sinus Problem    continues to have drainage from his sinuses. was going to stop the flonase because it made him irritated but continued it and its helped some but its still there   . Hemorrhoids    had a flare up of hemorhoids and due to have end of this month       HPI  HPI   Marc [name] is a 64 year old male patient Dr. Moshe Cipro.  Who presents today for follow-up on high blood pressure. Previously he had been increased to clonidine several times a day but reported it made him too tired.  And wanted to switch back to 0.1 3 times daily I did this at his last visit blood pressure was still not well controlled he was willing to have me add lisinopril in half follow-up which she presents for today.  Blood pressure is much better controlled but he reports that he is still having tiredness he wants to know if he can reduce the clonidine. Denies having any headaches, chest pain, leg swelling, vision changes, dizziness or any other signs or symptoms at this time of high blood pressure.  Reports to take his pressure at home last couple days and has been pretty close to what he saw that we had here.  Reports he still has some sinus drainage very atypical presentation in some fashion as he reports he has dry mouth and dry lips he is unsure if he is chewing on his lips at night.  Reports that his back of his mouth gets really full with fluid which he did not know if it was a saliva or if it was drainage from his sinuses.  He did try the Flonase at first it did not work but ongoing use is started working but is not working fully.  He uses Benadryl twice a day he reports that that helps some.  He does not think he has anything to do with why his mouth is so dry nose.  He reports that he seen a dentist recently but they did not  find anything going on.  Is open to being referred to a ENT to see if there is anything else that they can find or assess further back in his mouth by camera and/or nasal passages  Reports that his hemorrhoids are little bit flared up he has a colonoscopy coming up wanted to make sure that this was okay for him to proceed with.  Overall he is doing well and does not have any changes in bowel or bladder habits.  Denies having any pain just a little bit of bleeding inflammation when he goes to the bathroom.   Today patient denies signs and symptoms of COVID 19 infection including fever, chills, cough, shortness of breath, and headache. Past Medical, Surgical, Social History, Allergies, and Medications have been Reviewed.   Past Medical History:  Diagnosis Date  . Acute encephalopathy 12/29/2017   Hospitalized 5/22 to 5/23 with acute encephalopathy, unclear etiology  . Allergy   . Anxiety   . Arthritis   . Back problem   . Depression   . Epigastric pain 07/14/2012  . GERD (gastroesophageal reflux disease)   . Hepatitis C 1979  . Hepatitis C reactive    LIVER BX  2008-CHRONIC ACTIVE HEPATITIS  . HTN (hypertension)   . Hx of adenomatous colonic polyps 2008   due for surveillance 2017  . Hypercholesterolemia   . IBS (irritable bowel syndrome)   . Knee pain   . Leukopenia 11/06/2015  . Normal cardiac stress test 05/25/2011   [facility]  . Normal echocardiogram 05/25/2011   EF 55%, mild TR  . RUQ pain 07/14/2012  . Substance abuse ([place])    narcotic addiction  . Thrombocytopenia ([place]) 11/06/2015    Current Meds  Medication Sig  . acetaminophen (TYLENOL) 500 MG tablet Take 500 mg by mouth as needed.  . Ascorbic Acid (VITAMIN C PO) Take 1 Dose by mouth daily.  Marland Kitchen aspirin EC 81 MG tablet Take 81 mg by mouth daily.  . Cholecalciferol (VITAMIN D3 PO) Take 1 Dose by mouth daily.  . cloNIDine (CATAPRES) 0.1 MG tablet Take 1 tablet (0.1 mg total) by mouth 3 (three) times  daily. (Patient taking differently: Take 0.1 mg by mouth 2 (two) times daily. )  . dimenhyDRINATE (DRAMAMINE) 50 MG tablet Take 50 mg by mouth every 8 (eight) hours as needed for itching.  . fluorometholone (FML) 0.1 % ophthalmic suspension 1 drop 3 (three) times daily.  . fluticasone (FLONASE) 50 MCG/ACT nasal spray Place 2 sprays into both nostrils daily.  . hydrocortisone (ANUSOL-HC) 2.5 % rectal cream Place 1 application rectally 2 (two) times daily.  Marland Kitchen lisinopril (ZESTRIL) 10 MG tablet Take 1 tablet (10 mg total) by mouth daily.  . mirtazapine (REMERON) 15 MG tablet Take 1 tablet (15 mg total) by mouth at bedtime.  . Multiple Vitamins-Minerals (MULTIVITAMIN ADULTS 50+) TABS Take 1 tablet by mouth daily.  . Omega-3 Fatty Acids (FISH OIL) 1000 MG CAPS Take 1 capsule by mouth daily.  . polyethylene glycol-electrolytes (TRILYTE) 420 g solution Take 4,000 mLs by mouth as directed.  . prazosin (MINIPRESS) 5 MG capsule Take 1 capsule (5 mg total) by mouth at bedtime.  Marland Kitchen QUEtiapine (SEROQUEL) 50 MG tablet Take 1 tablet (50 mg total) by mouth 4 (four) times daily.  . Turmeric 400 MG CAPS Take by mouth 3 (three) times daily.    ROS:  Review of Systems  HENT: Negative.        Nasal drainage  Eyes: Negative.   Respiratory: Negative.   Cardiovascular: Negative.   Gastrointestinal: Negative.        Hemorrhoids  Genitourinary: Negative.   Musculoskeletal: Negative.   Skin: Negative.   Neurological: Negative.   Endo/Heme/Allergies: Negative.   Psychiatric/Behavioral: Negative.   All other systems reviewed and are negative.    Objective:   Today's Vitals: BP 120/74   Pulse 67   Temp (!) 97.4 F (36.3 C) (Temporal)   Resp 16   Ht 5\' 10"  (1.778 m)   Wt 163 lb (73.9 kg)   SpO2 98%   BMI 23.39 kg/m  Vitals with BMI 11/09/2019 10/12/2019 09/14/2019  Height 5\' 10"  5\' 10"  5\' 10"   Weight 163 lbs 166 lbs 2 oz 167 lbs  BMI 23.39 [id number]  Systolic 123456 [id number]  Diastolic 74 80 71  Pulse 67  80 69  Some encounter information is confidential and restricted. Go to Review Flowsheets activity to see all data.     Physical Exam Vitals and nursing note reviewed.  Constitutional:      Appearance: Normal appearance. He is well-developed, well-groomed and normal weight.  HENT:     Head: Normocephalic and atraumatic.  Right Ear: External ear normal.     Left Ear: External ear normal.     Nose: Nose normal. No congestion or rhinorrhea.     Mouth/Throat:     Mouth: Mucous membranes are moist.     Pharynx: Oropharynx is clear.  Eyes:     General:        Right eye: No discharge.        Left eye: No discharge.     Conjunctiva/sclera: Conjunctivae normal.  Cardiovascular:     Rate and Rhythm: Normal rate and regular rhythm.     Pulses: Normal pulses.     Heart sounds: Normal heart sounds.  Pulmonary:     Effort: Pulmonary effort is normal.     Breath sounds: Normal breath sounds.  Musculoskeletal:        General: Normal range of motion.     Cervical back: Normal range of motion and neck supple.  Skin:    General: Skin is warm.  Neurological:     General: No focal deficit present.     Mental Status: He is alert and oriented to person, place, and time.  Psychiatric:        Attention and Perception: Attention normal.        Mood and Affect: Mood normal.        Speech: Speech normal.        Behavior: Behavior normal. Behavior is cooperative.        Thought Content: Thought content normal.        Cognition and Memory: Cognition normal.        Judgment: Judgment normal.     Assessment   1. Nasal drainage   2. Seasonal allergies   3. Essential hypertension     Tests ordered Orders Placed This Encounter  Procedures  . Ambulatory referral to ENT     Plan: Please see assessment and plan per problem list above.   No orders of the defined types were placed in this encounter.   Patient to follow-up in 3 weeks  Perlie Mayo, NP

## 2019-11-16 ENCOUNTER — Telehealth: Payer: Self-pay

## 2019-11-16 ENCOUNTER — Other Ambulatory Visit: Payer: Self-pay

## 2019-11-16 DIAGNOSIS — I10 Essential (primary) hypertension: Secondary | ICD-10-CM

## 2019-11-16 MED ORDER — CLONIDINE HCL 0.1 MG PO TABS: 0.1000 mg | ORAL_TABLET | Freq: Three times a day (TID) | ORAL | 3 refills | Status: DC

## 2019-11-16 NOTE — Telephone Encounter (Signed)
Med sent to [facility]

## 2019-11-16 NOTE — Telephone Encounter (Signed)
Please call in [place] to [facility]

## 2019-11-27 ENCOUNTER — Encounter (INDEPENDENT_AMBULATORY_CARE_PROVIDER_SITE_OTHER): Payer: Self-pay | Admitting: Otolaryngology

## 2019-11-27 ENCOUNTER — Other Ambulatory Visit: Payer: Self-pay

## 2019-11-27 ENCOUNTER — Ambulatory Visit (INDEPENDENT_AMBULATORY_CARE_PROVIDER_SITE_OTHER): Payer: Medicare HMO | Admitting: Otolaryngology

## 2019-11-27 VITALS — Temp 97.9°F

## 2019-11-27 DIAGNOSIS — K219 Gastro-esophageal reflux disease without esophagitis: Secondary | ICD-10-CM

## 2019-11-27 DIAGNOSIS — J310 Chronic rhinitis: Secondary | ICD-10-CM

## 2019-11-27 DIAGNOSIS — H6123 Impacted cerumen, bilateral: Secondary | ICD-10-CM | POA: Diagnosis not present

## 2019-11-27 NOTE — Progress Notes (Signed)
HPI: Marc [name] is a 64 y.o. male who presents is referred by by his PCP for evaluation of complaints of chronic postnasal drainage that builds up his mouth and obstructs his throat.  The postnasal drainage seems to cause difficulty with his swallowing.  He does not really have that much drainage come out the front of his nose mostly just describes postnasal drainage.  But the problem seems to cause him more mouth and throat symptoms with excessive mucus in his throat.  He has used Flonase intermittently in the mornings as well as Nettie pot.  He felt like his hearing got better when he used the [place] pot. He has had no fever.  Has not coughed up any colored mucus or blown out any colored mucus from his nose.Marland Kitchen  Past Medical History:  Diagnosis Date  . Acute encephalopathy 12/29/2017   Hospitalized 5/22 to 5/23 with acute encephalopathy, unclear etiology  . Allergy   . Anxiety   . Arthritis   . Back problem   . Depression   . Epigastric pain 07/14/2012  . GERD (gastroesophageal reflux disease)   . Hepatitis C 1979  . Hepatitis C reactive    LIVER BX 2008-CHRONIC ACTIVE HEPATITIS  . HTN (hypertension)   . Hx of adenomatous colonic polyps 2008   due for surveillance 2017  . Hypercholesterolemia   . IBS (irritable bowel syndrome)   . Knee pain   . Leukopenia 11/06/2015  . Normal cardiac stress test 05/25/2011   [facility]  . Normal echocardiogram 05/25/2011   EF 55%, mild TR  . RUQ pain 07/14/2012  . Substance abuse ([place])    narcotic addiction  . Thrombocytopenia ([place]) 11/06/2015   Past Surgical History:  Procedure Laterality Date  . APPENDECTOMY    . CARPAL TUNNEL RELEASE     rt  . COLONOSCOPY  2008 [facility] ARS D100 V8 PHEN 12.5   2 SIMPLE ADENOMAS (< 1 CM)  . ESOPHAGOGASTRODUODENOSCOPY  04/26/2012   Dr. Oneida Alar: Non-erosive gastritis (inflammation) was found in the gastric antrum but no H.pylori; multiple biopsies (duodenal bx negative for Celiac)/ The mucosa of  the esophagus appeared normal  . EUS  09/08/2012   Dr. Ardis Hughs: CBD dilated but no stones. Query secondary to Sphincter of Oddi stenosis, ?dysfunction, but clinically without symptoms  . HARDWARE REMOVAL Right 02/12/2014   Procedure: HARDWARE REMOVAL;  Surgeon: Jolyn Nap, MD;  Location: [facility];  Service: Orthopedics;  Laterality: Right;  . KNEE ARTHROSCOPY    . Neurostimulator implant    . right ring finger    . spinal stenosis, had screws put in neck  04/11/2009   DR KRITZER  . TONSILLECTOMY    . ULNA OSTEOTOMY Right 02/12/2014   Procedure: RIGHT ULNAR SHORTENING AND OSTEOTOMY ;  Surgeon: Jolyn Nap, MD;  Location: [facility];  Service: Orthopedics;  Laterality: Right;  . ULNAR NERVE TRANSPOSITION    . UPPER GASTROINTESTINAL ENDOSCOPY  2008 SLF ABD PAIN WEIGHT LOSS d100 v8 phen 12.5   NL  . WRIST SURGERY Right jan 2015   Dr. Edmonia Lynch   Social History   Socioeconomic History  . Marital status: Divorced    Spouse name: Not on file  . Number of children: Not on file  . Years of education: Not on file  . Highest education level: Not on file  Occupational History  . Not on file  Tobacco Use  . Smoking status: Former Smoker  Packs/day: 1.50    Years: 8.00    Pack years: 12.00    Types: Cigarettes    Quit date: 08/13/1986    Years since quitting: 33.3  . Smokeless tobacco: Never Used  . Tobacco comment: quit 20 + yrs ago  Substance and Sexual Activity  . Alcohol use: No    Alcohol/week: 0.0 standard drinks  . Drug use: No  . Sexual activity: Never  Other Topics Concern  . Not on file  Social History Narrative  . Not on file   Social Determinants of Health   Financial Resource Strain: Low Risk   . Difficulty of Paying Living Expenses: Not hard at all  Food Insecurity: No Food Insecurity  . Worried About Charity fundraiser in the Last Year: Never true  . Ran Out of Food in the Last Year: Never true  Transportation Needs: No  Transportation Needs  . Lack of Transportation (Medical): No  . Lack of Transportation (Non-Medical): No  Physical Activity: Inactive  . Days of Exercise per Week: 0 days  . Minutes of Exercise per Session: 0 min  Stress: Stress Concern Present  . Feeling of Stress : Very much  Social Connections: Moderately Isolated  . Frequency of Communication with Friends and Family: More than three times a week  . Frequency of Social Gatherings with Friends and Family: Three times a week  . Attends Religious Services: Never  . Active Member of Clubs or Organizations: No  . Attends Archivist Meetings: Never  . Marital Status: Divorced   Family History  Problem Relation Age of Onset  . Bladder Cancer Mother        rare form   . Cancer Sister        Metastatic abdominal  . Emotional abuse Sister   . Stroke Sister   . Emotional abuse Sister   . Heart failure Father   . Stroke Father   . Alcohol abuse Father   . Dementia Paternal Aunt   . Alcohol abuse Paternal Uncle   . Dementia Paternal Uncle   . Dementia Paternal Grandmother   . Stroke Paternal Grandmother   . ADD / ADHD Cousin   . Bipolar disorder Cousin   . Anxiety disorder Cousin   . Depression Cousin        Committed suicide  . Alcohol abuse Cousin   . OCD Cousin   . Paranoid behavior Cousin   . Brain cancer Maternal Grandfather   . Heart defect Other        FAMILY HX  . Colon cancer Maternal Uncle   . Colon polyps Neg Hx   . Drug abuse Neg Hx   . Schizophrenia Neg Hx   . Seizures Neg Hx   . Sexual abuse Neg Hx   . Physical abuse Neg Hx    Allergies  Allergen Reactions  . Levofloxacin Nausea Only and Other (See Comments)    "Creepy" feeling, skin didn't feel right, sort of itchy.  . Ciprofloxacin     Other reaction(s): Sweating (intolerance)  . Sulfa Antibiotics    Prior to Admission medications   Medication Sig Start Date End Date Taking? Authorizing Provider  acetaminophen (TYLENOL) 500 MG tablet Take  500 mg by mouth 2 (two) times daily as needed (pain.).    Yes [provider]  Ascorbic Acid (VITAMIN C WITH ROSE HIPS) 500 MG tablet Take 500 mg by mouth daily.   Yes [provider]  aspirin EC 81 MG  tablet Take 81 mg by mouth daily.   Yes [provider]  Cholecalciferol (VITAMIN D3 PO) Take 2 tablets by mouth daily.    Yes [provider]  cloNIDine (CATAPRES) 0.1 MG tablet Take 1 tablet (0.1 mg total) by mouth 3 (three) times daily. 11/16/19  Yes Fayrene Helper, MD  dimenhyDRINATE (DRAMAMINE) 50 MG tablet Take 50 mg by mouth every 8 (eight) hours as needed for nausea (sour stomach).    Yes [provider]  fluticasone (FLONASE) 50 MCG/ACT nasal spray Place 2 sprays into both nostrils daily. 10/12/19  Yes Perlie Mayo, NP  hydrocortisone (ANUSOL-HC) 2.5 % rectal cream Place 1 application rectally 2 (two) times daily. 09/07/19  Yes Annitta Needs, NP  ibuprofen (ADVIL) 200 MG tablet Take 400 mg by mouth 3 (three) times daily as needed (pain.).   Yes [provider]  lisinopril (ZESTRIL) 10 MG tablet Take 1 tablet (10 mg total) by mouth daily. 10/12/19  Yes Perlie Mayo, NP  mirtazapine (REMERON) 15 MG tablet Take 1 tablet (15 mg total) by mouth at bedtime. 09/21/19  Yes Cloria Spring, MD  Multiple Vitamins-Minerals (MULTIVITAMIN ADULTS 50+) TABS Take 1 tablet by mouth daily.   Yes [provider]  Omega-3 Fatty Acids (FISH OIL) 1000 MG CAPS Take 1,000 mg by mouth daily.    Yes [provider]  polyethylene glycol-electrolytes (TRILYTE) 420 g solution Take 4,000 mLs by mouth as directed. 09/13/19  Yes Fields, Sandi L, MD  prazosin (MINIPRESS) 5 MG capsule Take 1 capsule (5 mg total) by mouth at bedtime. 09/21/19  Yes Cloria Spring, MD  QUEtiapine (SEROQUEL) 50 MG tablet Take 1 tablet (50 mg total) by mouth 4 (four) times daily. 09/21/19  Yes Cloria Spring, MD  senna (SENOKOT) 8.6 MG tablet Take 2 tablets by mouth 3 (three)  times daily as needed for constipation.   Yes [provider]  Turmeric 400 MG CAPS Take 400 mg by mouth daily.    Yes [provider]     Positive ROS: Otherwise negative  All other systems have been reviewed and were otherwise negative with the exception of those mentioned in the HPI and as above.  Physical Exam: Constitutional: Alert, well-appearing, no acute distress Ears: External ears without lesions or tenderness.  He has large amount of hairs of wax in both ear canals right side much worse than left.  This was cleaned in the office using suction as well as forceps to remove the hairs in his ear canals.  After cleaning the ear canals the TMs were clear bilaterally with good mobility on pneumatic otoscopy. Nasal: External nose without lesions. Septum slightly deviated to the left..  Middle meatus regions are clear with no active mucopurulent discharge noted.  No polyps noted. Oral: Lips and gums without lesions. Tongue and palate mucosa without lesions. Posterior oropharynx clear.  Indirect laryngoscopy revealed a clear base of tongue vallecula and epiglottis. Fiberoptic laryngoscopy was performed through the right nostril.  On fiberoptic laryngoscopy the middle meatus and posterior nasopharynx was clear.  No active drainage noted within the posterior nasal cavity.  The base of tongue vallecula epiglottis were normal.  He did have moderate supraglottic mucus but this was clear.  Vocal cords were clear with normal vocal cord mobility.  He had mild arytenoid edema that might be secondary to reflux problems or chronic throat clearing.  No mucosal abnormalities noted. Neck: No palpable adenopathy or masses.  No palpable adenopathy  on either side of the neck.  No palpable thyroid nodules noted. Respiratory: Breathing comfortably  Skin: No facial/neck lesions or rash noted.  Laryngoscopy  Date/Time: 11/27/2019 4:43 PM Performed by: Rozetta Nunnery, MD Authorized by:  Rozetta Nunnery, MD   Consent:    Consent obtained:  Verbal   Consent given by:  Patient Procedure details:    Indications: direct visualization of the upper aerodigestive tract     Medication:  Afrin   Instrument: flexible fiberoptic laryngoscope     Scope location: right nare   Sinus:    Right middle meatus: normal     Right nasopharynx: normal   Mouth:    Oropharynx: normal     Vallecula: normal     Base of tongue: normal     Epiglottis: normal   Throat:    True vocal cords: normal   Comments:     On fiberoptic laryngoscopy through the right nostril this revealed clear nasopharynx and upper airway examination.  There is no evidence of active infection.  No evidence of neoplasm.  Hypopharynx and larynx was normal with normal vocal cord examination. Cerumen impaction removal  Date/Time: 11/27/2019 4:46 PM Performed by: Rozetta Nunnery, MD Authorized by: Rozetta Nunnery, MD   Consent:    Consent obtained:  Verbal   Consent given by:  Patient   Risks discussed:  Pain and bleeding Procedure details:    Location:  L ear and R ear   Procedure type: curette, suction and forceps   Post-procedure details:    Inspection:  TM intact and canal normal   Hearing quality:  Improved   Patient tolerance of procedure:  Tolerated well, no immediate complications Comments:     TMs are clear bilaterally.    Assessment: Excessive postnasal drainage with chronic throat complaints most likely related to chronic rhinitis and laryngeal pharyngeal reflux. Cerumen buildup bilaterally with excessive amount of hairs in the ear canals.  Plan: Recommended use of Flonase 2 sprays each nostril at night.  Also suggested use of saline irrigations for his nose and suggested the [place] brand. Also prescribed omeprazole 40 mg daily before dinner for the next month Also prescribed Augmentin 875 mg twice daily for 10 days just in case he has sinus issues that were unable to be adequately  visualized on rhinoscopy. He will call us back in a month if symptoms persist will plan further evaluation of her sinuses with CT scan.   Radene Journey, MD   CC:

## 2019-11-30 ENCOUNTER — Encounter: Payer: Self-pay | Admitting: Family Medicine

## 2019-11-30 ENCOUNTER — Ambulatory Visit (INDEPENDENT_AMBULATORY_CARE_PROVIDER_SITE_OTHER): Payer: Medicare HMO | Admitting: Family Medicine

## 2019-11-30 ENCOUNTER — Other Ambulatory Visit: Payer: Self-pay

## 2019-11-30 VITALS — BP 120/74 | HR 75 | Temp 97.3°F | Resp 16 | Ht 70.0 in | Wt 161.0 lb

## 2019-11-30 DIAGNOSIS — H1045 Other chronic allergic conjunctivitis: Secondary | ICD-10-CM | POA: Diagnosis not present

## 2019-11-30 DIAGNOSIS — I10 Essential (primary) hypertension: Secondary | ICD-10-CM | POA: Diagnosis not present

## 2019-11-30 DIAGNOSIS — H43811 Vitreous degeneration, right eye: Secondary | ICD-10-CM | POA: Diagnosis not present

## 2019-11-30 DIAGNOSIS — H2513 Age-related nuclear cataract, bilateral: Secondary | ICD-10-CM | POA: Diagnosis not present

## 2019-11-30 NOTE — Assessment & Plan Note (Signed)
Reports he never changed his dose of clonidine to stop the morning dose. Reports that he takes it 2-3 times daily depending on when he wakes up. Encouraged to be consistent. BP controlled today in office. Continue DASH and 30 mins of exercise.

## 2019-11-30 NOTE — Progress Notes (Signed)
Subjective:  Patient ID: Marc [name], male    DOB: 1956/03/28  Age: 64 y.o. MRN: [id number]  CC:  Chief Complaint  Patient presents with  . Hypertension    blood pressure follow up   . Eye Problem    seeing black spots floating out of right eye. He was told to go to [facility] by Dr Jorja Loa and they directed him back to Dr Jorja Loa.  He keeps getting the run around from everyone and has not been seen       HPI  HPI  Who presents today for follow-up on high blood pressure.   11/09/2019: Previously he had been increased to clonidine several times a day but reported it made him too tired.  And wanted to switch back to 0.1 3 times daily I did this at his last visit blood pressure was still not well controlled he was willing to have me add lisinopril in half follow-up which she presents for today.  Blood pressure is much better controlled but he reports that he is still having tiredness he wants to know if he can reduce the clonidine.   Today: Never changed Clonidine to stop morning dose, reports taking 2-3 daily depending on when he wakes up.  Denies having any headaches, chest pain, leg swelling, vision changes, dizziness or any other signs or symptoms at this time of high blood pressure.  Reports to take his pressure at home last couple days and has been pretty close to what he saw that we had here.  Eye concern: during visit eye dr called and told him to come in now, so he will report there after this visit.   Today patient denies signs and symptoms of COVID 19 infection including fever, chills, cough, shortness of breath, and headache. Past Medical, Surgical, Social History, Allergies, and Medications have been Reviewed.   Past Medical History:  Diagnosis Date  . Acute encephalopathy 12/29/2017   Hospitalized 5/22 to 5/23 with acute encephalopathy, unclear etiology  . Allergy   . Anxiety   . Arthritis   . Back problem   . Depression   . Epigastric pain 07/14/2012  . GERD  (gastroesophageal reflux disease)   . Hepatitis C 1979  . Hepatitis C reactive    LIVER BX 2008-CHRONIC ACTIVE HEPATITIS  . HTN (hypertension)   . Hx of adenomatous colonic polyps 2008   due for surveillance 2017  . Hypercholesterolemia   . IBS (irritable bowel syndrome)   . Knee pain   . Leukopenia 11/06/2015  . Normal cardiac stress test 05/25/2011   [facility]  . Normal echocardiogram 05/25/2011   EF 55%, mild TR  . RUQ pain 07/14/2012  . Substance abuse ([place])    narcotic addiction  . Thrombocytopenia ([place]) 11/06/2015    Current Meds  Medication Sig  . acetaminophen (TYLENOL) 500 MG tablet Take 500 mg by mouth 2 (two) times daily as needed (pain.).   Marland Kitchen Ascorbic Acid (VITAMIN C WITH ROSE HIPS) 500 MG tablet Take 500 mg by mouth daily.  Marland Kitchen aspirin EC 81 MG tablet Take 81 mg by mouth daily.  . Cholecalciferol (VITAMIN D3 PO) Take 2 tablets by mouth daily.   . cloNIDine (CATAPRES) 0.1 MG tablet Take 1 tablet (0.1 mg total) by mouth 3 (three) times daily.  Marland Kitchen dimenhyDRINATE (DRAMAMINE) 50 MG tablet Take 50 mg by mouth every 8 (eight) hours as needed for nausea (sour stomach).   . fluticasone (FLONASE) 50 MCG/ACT  nasal spray Place 2 sprays into both nostrils daily.  . hydrocortisone (ANUSOL-HC) 2.5 % rectal cream Place 1 application rectally 2 (two) times daily.  Marland Kitchen ibuprofen (ADVIL) 200 MG tablet Take 400 mg by mouth 3 (three) times daily as needed (pain.).  Marland Kitchen lisinopril (ZESTRIL) 10 MG tablet Take 1 tablet (10 mg total) by mouth daily.  . mirtazapine (REMERON) 15 MG tablet Take 1 tablet (15 mg total) by mouth at bedtime.  . Multiple Vitamins-Minerals (MULTIVITAMIN ADULTS 50+) TABS Take 1 tablet by mouth daily.  . Omega-3 Fatty Acids (FISH OIL) 1000 MG CAPS Take 1,000 mg by mouth daily.   . polyethylene glycol-electrolytes (TRILYTE) 420 g solution Take 4,000 mLs by mouth as directed.  . prazosin (MINIPRESS) 5 MG capsule Take 1 capsule (5 mg total) by mouth at bedtime.   Marland Kitchen QUEtiapine (SEROQUEL) 50 MG tablet Take 1 tablet (50 mg total) by mouth 4 (four) times daily.  Marland Kitchen senna (SENOKOT) 8.6 MG tablet Take 2 tablets by mouth 3 (three) times daily as needed for constipation.  . Turmeric 400 MG CAPS Take 400 mg by mouth daily.     ROS:  Review of Systems  Constitutional: Negative.   HENT: Negative.   Eyes: Negative.   Respiratory: Negative.   Cardiovascular: Negative.   Gastrointestinal: Negative.   Genitourinary: Negative.   Musculoskeletal: Negative.   Skin: Negative.   Neurological: Negative.   Endo/Heme/Allergies: Negative.   Psychiatric/Behavioral: Negative.   All other systems reviewed and are negative.    Objective:   Today's Vitals: BP 120/74   Pulse 75   Temp (!) 97.3 F (36.3 C) (Temporal)   Resp 16   Ht 5\' 10"  (1.778 m)   Wt 161 lb (73 kg)   SpO2 96%   BMI 23.10 kg/m  Vitals with BMI 11/30/2019 11/09/2019 10/12/2019  Height 5\' 10"  5\' 10"  5\' 10"   Weight 161 lbs 163 lbs 166 lbs 2 oz  BMI 23.1 0000000 [id number]  Systolic 123456 123456 [id number]  Diastolic 74 74 80  Pulse 75 67 80  Some encounter information is confidential and restricted. Go to Review Flowsheets activity to see all data.     Physical Exam Vitals and nursing note reviewed.  Constitutional:      Appearance: Normal appearance. He is well-developed, well-groomed and normal weight.  HENT:     Head: Normocephalic and atraumatic.     Right Ear: External ear normal.     Left Ear: External ear normal.     Mouth/Throat:     Comments: Mask in place  Eyes:     General:        Right eye: No discharge.        Left eye: No discharge.     Conjunctiva/sclera: Conjunctivae normal.  Cardiovascular:     Rate and Rhythm: Normal rate and regular rhythm.     Pulses: Normal pulses.     Heart sounds: Normal heart sounds.  Pulmonary:     Effort: Pulmonary effort is normal.     Breath sounds: Normal breath sounds.  Musculoskeletal:        General: Normal range of motion.     Cervical back:  Normal range of motion and neck supple.  Skin:    General: Skin is warm.  Neurological:     General: No focal deficit present.     Mental Status: He is alert and oriented to person, place, and time.  Psychiatric:        Attention and Perception:  Attention normal.        Mood and Affect: Mood normal.        Speech: Speech normal.        Behavior: Behavior normal. Behavior is cooperative.        Thought Content: Thought content normal.        Cognition and Memory: Cognition normal.        Judgment: Judgment normal.     Assessment   1. Essential hypertension     Tests ordered No orders of the defined types were placed in this encounter.   Plan: Please see assessment and plan per problem list above.   No orders of the defined types were placed in this encounter.   Patient to follow-up in 3 months.  Perlie Mayo, NP

## 2019-11-30 NOTE — Patient Instructions (Signed)
Marc [name]  11/30/2019     @PREFPERIOPPHARMACY @   Your procedure is scheduled on  12/05/2019 .  Report to [name] at  0700  A.M.  Call this number if you have problems the morning of surgery:  [phone]   Remember:  Follow the diet and prep instructions given to you by Dr Nona Dell office.                       Take these medicines the morning of surgery with A SIP OF WATER  Clonidine, dramamine(if needed), seroquel.    Do not wear jewelry, make-up or nail polish.  Do not wear lotions, powders, or perfumes. Please wear deodorant and brush your teeth.  Do not shave 48 hours prior to surgery.  Men may shave face and neck.  Do not bring valuables to the hospital.  [facility] is not responsible for any belongings or valuables.  Contacts, dentures or bridgework may not be worn into surgery.  Leave your suitcase in the car.  After surgery it may be brought to your room.  For patients admitted to the hospital, discharge time will be determined by your treatment team.  Patients discharged the day of surgery will not be allowed to drive home.   Name and phone number of your driver:   family Special instructions:  DO NOT smoke the morning of your procedure.  Please read over the following fact sheets that you were given. Anesthesia Post-op Instructions and Care and Recovery After Surgery       Upper Endoscopy, Adult, Care After This sheet gives you information about how to care for yourself after your procedure. Your health care provider may also give you more specific instructions. If you have problems or questions, contact your health care provider. What can I expect after the procedure? After the procedure, it is common to have:  A sore throat.  Mild stomach pain or discomfort.  Bloating.  Nausea. Follow these instructions at home:   Follow instructions from your health care provider about what to eat or drink after your procedure.  Return to your  normal activities as told by your health care provider. Ask your health care provider what activities are safe for you.  Take over-the-counter and prescription medicines only as told by your health care provider.  Do not drive for 24 hours if you were given a sedative during your procedure.  Keep all follow-up visits as told by your health care provider. This is important. Contact a health care provider if you have:  A sore throat that lasts longer than one day.  Trouble swallowing. Get help right away if:  You vomit blood or your vomit looks like coffee grounds.  You have: ? A fever. ? Bloody, black, or tarry stools. ? A severe sore throat or you cannot swallow. ? Difficulty breathing. ? Severe pain in your chest or abdomen. Summary  After the procedure, it is common to have a sore throat, mild stomach discomfort, bloating, and nausea.  Do not drive for 24 hours if you were given a sedative during the procedure.  Follow instructions from your health care provider about what to eat or drink after your procedure.  Return to your normal activities as told by your health care provider. This information is not intended to replace advice given to you by your health care provider. Make sure you discuss any questions you have with your  health care provider. Document Revised: 01/18/2018 Document Reviewed: 12/27/2017 Elsevier Patient Education  [place].  Colonoscopy, Adult, Care After This sheet gives you information about how to care for yourself after your procedure. Your health care provider may also give you more specific instructions. If you have problems or questions, contact your health care provider. What can I expect after the procedure? After the procedure, it is common to have:  A small amount of blood in your stool for 24 hours after the procedure.  Some gas.  Mild cramping or bloating of your abdomen. Follow these instructions at home: Eating and drinking    Drink enough fluid to keep your urine pale yellow.  Follow instructions from your health care provider about eating or drinking restrictions.  Resume your normal diet as instructed by your health care provider. Avoid heavy or fried foods that are hard to digest. Activity  Rest as told by your health care provider.  Avoid sitting for a long time without moving. Get up to take short walks every 1-2 hours. This is important to improve blood flow and breathing. Ask for help if you feel weak or unsteady.  Return to your normal activities as told by your health care provider. Ask your health care provider what activities are safe for you. Managing cramping and bloating   Try walking around when you have cramps or feel bloated.  Apply heat to your abdomen as told by your health care provider. Use the heat source that your health care provider recommends, such as a moist heat pack or a heating pad. ? Place a towel between your skin and the heat source. ? Leave the heat on for 20-30 minutes. ? Remove the heat if your skin turns bright red. This is especially important if you are unable to feel pain, heat, or cold. You may have a greater risk of getting burned. General instructions  For the first 24 hours after the procedure: ? Do not drive or use machinery. ? Do not sign important documents. ? Do not drink alcohol. ? Do your regular daily activities at a slower pace than normal. ? Eat soft foods that are easy to digest.  Take over-the-counter and prescription medicines only as told by your health care provider.  Keep all follow-up visits as told by your health care provider. This is important. Contact a health care provider if:  You have blood in your stool 2-3 days after the procedure. Get help right away if you have:  More than a small spotting of blood in your stool.  Large blood clots in your stool.  Swelling of your abdomen.  Nausea or vomiting.  A fever.  Increasing  pain in your abdomen that is not relieved with medicine. Summary  After the procedure, it is common to have a small amount of blood in your stool. You may also have mild cramping and bloating of your abdomen.  For the first 24 hours after the procedure, do not drive or use machinery, sign important documents, or drink alcohol.  Get help right away if you have a lot of blood in your stool, nausea or vomiting, a fever, or increased pain in your abdomen. This information is not intended to replace advice given to you by your health care provider. Make sure you discuss any questions you have with your health care provider. Document Revised: 02/20/2019 Document Reviewed: 02/20/2019 Elsevier Patient Education  [place] After These instructions provide you with information  about caring for yourself after your procedure. Your health care provider may also give you more specific instructions. Your treatment has been planned according to current medical practices, but problems sometimes occur. Call your health care provider if you have any problems or questions after your procedure. What can I expect after the procedure? After your procedure, you may:  Feel sleepy for several hours.  Feel clumsy and have poor balance for several hours.  Feel forgetful about what happened after the procedure.  Have poor judgment for several hours.  Feel nauseous or vomit.  Have a sore throat if you had a breathing tube during the procedure. Follow these instructions at home: For at least 24 hours after the procedure:      Have a responsible adult stay with you. It is important to have someone help care for you until you are awake and alert.  Rest as needed.  Do not: ? Participate in activities in which you could fall or become injured. ? Drive. ? Use heavy machinery. ? Drink alcohol. ? Take sleeping pills or medicines that cause drowsiness. ? Make important  decisions or sign legal documents. ? Take care of children on your own. Eating and drinking  Follow the diet that is recommended by your health care provider.  If you vomit, drink water, juice, or soup when you can drink without vomiting.  Make sure you have little or no nausea before eating solid foods. General instructions  Take over-the-counter and prescription medicines only as told by your health care provider.  If you have sleep apnea, surgery and certain medicines can increase your risk for breathing problems. Follow instructions from your health care provider about wearing your sleep device: ? Anytime you are sleeping, including during daytime naps. ? While taking prescription pain medicines, sleeping medicines, or medicines that make you drowsy.  If you smoke, do not smoke without supervision.  Keep all follow-up visits as told by your health care provider. This is important. Contact a health care provider if:  You keep feeling nauseous or you keep vomiting.  You feel light-headed.  You develop a rash.  You have a fever. Get help right away if:  You have trouble breathing. Summary  For several hours after your procedure, you may feel sleepy and have poor judgment.  Have a responsible adult stay with you for at least 24 hours or until you are awake and alert. This information is not intended to replace advice given to you by your health care provider. Make sure you discuss any questions you have with your health care provider. Document Revised: 10/25/2017 Document Reviewed: 11/17/2015 Elsevier Patient Education  [place].

## 2019-11-30 NOTE — Patient Instructions (Signed)
I appreciate the opportunity to provide you with care for your health and wellness. Today we discussed: blood pressure   Follow up: 3 months   No labs or referrals today  Please continue to practice social distancing to keep you, your family, and our community safe.  If you must go out, please wear a mask and practice good handwashing.  It was a pleasure to see you and I look forward to continuing to work together on your health and well-being. Please do not hesitate to call the office if you need care or have questions about your care.  Have a wonderful day and week. With Gratitude, Cherly Beach, DNP, AGNP-BC

## 2019-12-01 ENCOUNTER — Other Ambulatory Visit (HOSPITAL_COMMUNITY): Admission: RE | Admit: 2019-12-01 | Payer: Medicare HMO | Source: Ambulatory Visit

## 2019-12-01 ENCOUNTER — Other Ambulatory Visit (HOSPITAL_COMMUNITY)
Admission: RE | Admit: 2019-12-01 | Discharge: 2019-12-01 | Disposition: A | Discharge status: Home / Self Care | Payer: Medicare HMO | Source: Ambulatory Visit | Attending: Gastroenterology | Admitting: Gastroenterology

## 2019-12-01 ENCOUNTER — Other Ambulatory Visit (HOSPITAL_COMMUNITY): Payer: Medicare HMO

## 2019-12-01 ENCOUNTER — Encounter (HOSPITAL_COMMUNITY)
Admission: RE | Admit: 2019-12-01 | Discharge: 2019-12-01 | Disposition: A | Discharge status: Home / Self Care | Payer: Medicare HMO | Source: Ambulatory Visit | Attending: Gastroenterology | Admitting: Gastroenterology

## 2019-12-01 ENCOUNTER — Other Ambulatory Visit: Payer: Self-pay

## 2019-12-01 ENCOUNTER — Encounter (HOSPITAL_COMMUNITY): Payer: Self-pay

## 2019-12-01 DIAGNOSIS — Z01812 Encounter for preprocedural laboratory examination: Secondary | ICD-10-CM | POA: Insufficient documentation

## 2019-12-01 DIAGNOSIS — Z20822 Contact with and (suspected) exposure to covid-19: Secondary | ICD-10-CM | POA: Insufficient documentation

## 2019-12-01 HISTORY — DX: Gout, unspecified: M10.9

## 2019-12-01 LAB — CBC WITH DIFFERENTIAL/PLATELET
Abs Immature Granulocytes: 0.01 10*3/uL (ref 0.00–0.07)
Basophils Absolute: 0 10*3/uL (ref 0.0–0.1)
Basophils Relative: 1 %
Eosinophils Absolute: 0.2 10*3/uL (ref 0.0–0.5)
Eosinophils Relative: 6 %
HCT: 39.3 % (ref 39.0–52.0)
Hemoglobin: 13.3 g/dL (ref 13.0–17.0)
Immature Granulocytes: 0 %
Lymphocytes Relative: 36 %
Lymphs Abs: 1.4 10*3/uL (ref 0.7–4.0)
MCH: 30.4 pg (ref 26.0–34.0)
MCHC: 33.8 g/dL (ref 30.0–36.0)
MCV: 89.7 fL (ref 80.0–100.0)
Monocytes Absolute: 0.3 10*3/uL (ref 0.1–1.0)
Monocytes Relative: 7 %
Neutro Abs: 2 10*3/uL (ref 1.7–7.7)
Neutrophils Relative %: 50 %
Platelets: 127 10*3/uL — ABNORMAL LOW (ref 150–400)
RBC: 4.38 MIL/uL (ref 4.22–5.81)
RDW: 13.1 % (ref 11.5–15.5)
WBC: 3.9 10*3/uL — ABNORMAL LOW (ref 4.0–10.5)
nRBC: 0 % (ref 0.0–0.2)

## 2019-12-02 LAB — SARS CORONAVIRUS 2 (TAT 6-24 HRS): SARS Coronavirus 2: NEGATIVE

## 2019-12-04 ENCOUNTER — Encounter: Payer: Self-pay | Admitting: Gastroenterology

## 2019-12-05 ENCOUNTER — Encounter (HOSPITAL_COMMUNITY): Payer: Self-pay | Admitting: Gastroenterology

## 2019-12-05 ENCOUNTER — Encounter (HOSPITAL_COMMUNITY)
Admission: RE | Disposition: A | Discharge status: Home / Self Care | Payer: Self-pay | Source: Home / Self Care | Attending: Gastroenterology

## 2019-12-05 ENCOUNTER — Ambulatory Visit (HOSPITAL_COMMUNITY)
Admission: RE | Admit: 2019-12-05 | Discharge: 2019-12-05 | Disposition: A | Discharge status: Home / Self Care | Payer: Medicare HMO | Attending: Gastroenterology | Admitting: Gastroenterology

## 2019-12-05 ENCOUNTER — Ambulatory Visit (HOSPITAL_COMMUNITY): Payer: Medicare HMO | Admitting: Anesthesiology

## 2019-12-05 DIAGNOSIS — F419 Anxiety disorder, unspecified: Secondary | ICD-10-CM | POA: Diagnosis not present

## 2019-12-05 DIAGNOSIS — F329 Major depressive disorder, single episode, unspecified: Secondary | ICD-10-CM | POA: Insufficient documentation

## 2019-12-05 DIAGNOSIS — K625 Hemorrhage of anus and rectum: Secondary | ICD-10-CM | POA: Diagnosis not present

## 2019-12-05 DIAGNOSIS — Z79899 Other long term (current) drug therapy: Secondary | ICD-10-CM | POA: Diagnosis not present

## 2019-12-05 DIAGNOSIS — Z86010 Personal history of colonic polyps: Secondary | ICD-10-CM | POA: Insufficient documentation

## 2019-12-05 DIAGNOSIS — K219 Gastro-esophageal reflux disease without esophagitis: Secondary | ICD-10-CM | POA: Diagnosis not present

## 2019-12-05 DIAGNOSIS — Z7982 Long term (current) use of aspirin: Secondary | ICD-10-CM | POA: Diagnosis not present

## 2019-12-05 DIAGNOSIS — K2990 Gastroduodenitis, unspecified, without bleeding: Secondary | ICD-10-CM | POA: Diagnosis not present

## 2019-12-05 DIAGNOSIS — B1920 Unspecified viral hepatitis C without hepatic coma: Secondary | ICD-10-CM | POA: Diagnosis not present

## 2019-12-05 DIAGNOSIS — E7800 Pure hypercholesterolemia, unspecified: Secondary | ICD-10-CM | POA: Insufficient documentation

## 2019-12-05 DIAGNOSIS — M1990 Unspecified osteoarthritis, unspecified site: Secondary | ICD-10-CM | POA: Diagnosis not present

## 2019-12-05 DIAGNOSIS — K759 Inflammatory liver disease, unspecified: Secondary | ICD-10-CM

## 2019-12-05 DIAGNOSIS — Q439 Congenital malformation of intestine, unspecified: Secondary | ICD-10-CM | POA: Insufficient documentation

## 2019-12-05 DIAGNOSIS — K589 Irritable bowel syndrome without diarrhea: Secondary | ICD-10-CM | POA: Diagnosis not present

## 2019-12-05 DIAGNOSIS — K2970 Gastritis, unspecified, without bleeding: Secondary | ICD-10-CM | POA: Diagnosis not present

## 2019-12-05 DIAGNOSIS — T39395A Adverse effect of other nonsteroidal anti-inflammatory drugs [NSAID], initial encounter: Secondary | ICD-10-CM | POA: Diagnosis not present

## 2019-12-05 DIAGNOSIS — I10 Essential (primary) hypertension: Secondary | ICD-10-CM | POA: Insufficient documentation

## 2019-12-05 DIAGNOSIS — M109 Gout, unspecified: Secondary | ICD-10-CM | POA: Insufficient documentation

## 2019-12-05 DIAGNOSIS — K921 Melena: Secondary | ICD-10-CM | POA: Diagnosis not present

## 2019-12-05 DIAGNOSIS — Z87891 Personal history of nicotine dependence: Secondary | ICD-10-CM | POA: Insufficient documentation

## 2019-12-05 DIAGNOSIS — K635 Polyp of colon: Secondary | ICD-10-CM | POA: Diagnosis not present

## 2019-12-05 DIAGNOSIS — K648 Other hemorrhoids: Secondary | ICD-10-CM | POA: Diagnosis not present

## 2019-12-05 DIAGNOSIS — K2980 Duodenitis without bleeding: Secondary | ICD-10-CM

## 2019-12-05 DIAGNOSIS — K644 Residual hemorrhoidal skin tags: Secondary | ICD-10-CM | POA: Insufficient documentation

## 2019-12-05 HISTORY — PX: POLYPECTOMY: SHX5525

## 2019-12-05 HISTORY — PX: ESOPHAGOGASTRODUODENOSCOPY (EGD) WITH PROPOFOL: SHX5813

## 2019-12-05 HISTORY — PX: COLONOSCOPY WITH PROPOFOL: SHX5780

## 2019-12-05 SURGERY — COLONOSCOPY WITH PROPOFOL
Anesthesia: General

## 2019-12-05 MED ORDER — LACTATED RINGERS IV SOLN: INTRAVENOUS | Status: DC | PRN

## 2019-12-05 MED ORDER — PROPOFOL 10 MG/ML IV BOLUS
INTRAVENOUS | Status: DC | PRN
  Administered 2019-12-05: 40 mg via INTRAVENOUS

## 2019-12-05 MED ORDER — LIDOCAINE 2% (20 MG/ML) 5 ML SYRINGE
INTRAMUSCULAR | Status: AC
  Filled 2019-12-05: qty 5

## 2019-12-05 MED ORDER — GLYCOPYRROLATE PF 0.2 MG/ML IJ SOSY
PREFILLED_SYRINGE | INTRAMUSCULAR | Status: AC
  Filled 2019-12-05: qty 1

## 2019-12-05 MED ORDER — STERILE WATER FOR IRRIGATION IR SOLN
Status: DC | PRN
  Administered 2019-12-05: 09:00:00 100 mL

## 2019-12-05 MED ORDER — CHLORHEXIDINE GLUCONATE CLOTH 2 % EX PADS: 6.0000 | MEDICATED_PAD | Freq: Once | CUTANEOUS | Status: DC

## 2019-12-05 MED ORDER — LACTATED RINGERS IV SOLN: Freq: Once | INTRAVENOUS | Status: AC

## 2019-12-05 MED ORDER — PROPOFOL 500 MG/50ML IV EMUL
INTRAVENOUS | Status: DC | PRN
  Administered 2019-12-05: 150 ug/kg/min via INTRAVENOUS

## 2019-12-05 MED ORDER — PROPOFOL 10 MG/ML IV BOLUS
INTRAVENOUS | Status: AC
  Filled 2019-12-05: qty 20

## 2019-12-05 MED ORDER — LIDOCAINE HCL 1 % IJ SOLN
INTRAMUSCULAR | Status: DC | PRN
  Administered 2019-12-05: 50 mg via INTRADERMAL

## 2019-12-05 MED ORDER — GLYCOPYRROLATE 0.2 MG/ML IJ SOLN
INTRAMUSCULAR | Status: DC | PRN
  Administered 2019-12-05: .2 mg via INTRAVENOUS

## 2019-12-05 MED ORDER — KETAMINE HCL 10 MG/ML IJ SOLN
INTRAMUSCULAR | Status: DC | PRN
  Administered 2019-12-05: 20 mg via INTRAVENOUS

## 2019-12-05 MED ORDER — PROPOFOL 10 MG/ML IV BOLUS
INTRAVENOUS | Status: AC
  Filled 2019-12-05: qty 40

## 2019-12-05 NOTE — H&P (Signed)
Primary Care Physician:  Fayrene Helper, MD Primary Gastroenterologist:  Dr. Oneida Alar  Pre-Procedure History & Physical: HPI:  Marc [name] is a 64 y.o. male here for BRBPR/screening for varices.  Past Medical History:  Diagnosis Date  . Acute encephalopathy 12/29/2017   Hospitalized 5/22 to 5/23 with acute encephalopathy, unclear etiology  . Allergy   . Anxiety   . Arthritis   . Back problem   . Depression   . Epigastric pain 07/14/2012  . GERD (gastroesophageal reflux disease)   . Gout   . Hepatitis C 1979  . Hepatitis C reactive    LIVER BX 2008-CHRONIC ACTIVE HEPATITIS  . HTN (hypertension)   . Hx of adenomatous colonic polyps 2008   due for surveillance 2017  . Hypercholesterolemia   . IBS (irritable bowel syndrome)   . Irritable bowel syndrome 01/04/2009   Qualifier: Diagnosis of  By: Westly Pam.   . Knee pain   . Leukopenia 11/06/2015  . Normal cardiac stress test 05/25/2011   [facility] [place]  . Normal echocardiogram 05/25/2011   EF 55%, mild TR  . RUQ pain 07/14/2012  . Substance abuse ([place])    narcotic addiction  . Thrombocytopenia ([place]) 11/06/2015    Past Surgical History:  Procedure Laterality Date  . APPENDECTOMY    . CARPAL TUNNEL RELEASE     rt  . COLONOSCOPY  2008 [facility] ARS D100 V8 PHEN 12.5   2 SIMPLE ADENOMAS (< 1 CM)  . ESOPHAGOGASTRODUODENOSCOPY  04/26/2012   Dr. Oneida Alar: Non-erosive gastritis (inflammation) was found in the gastric antrum but no H.pylori; multiple biopsies (duodenal bx negative for Celiac)/ The mucosa of the esophagus appeared normal  . EUS  09/08/2012   Dr. Ardis Hughs: CBD dilated but no stones. Query secondary to Sphincter of Oddi stenosis, ?dysfunction, but clinically without symptoms  . HARDWARE REMOVAL Right 02/12/2014   Procedure: HARDWARE REMOVAL;  Surgeon: Jolyn Nap, MD;  Location: [facility];  Service: Orthopedics;  Laterality: Right;  . KNEE ARTHROSCOPY    . Neurostimulator implant     . right ring finger    . spinal stenosis, had screws put in neck  04/11/2009   DR KRITZER  . TONSILLECTOMY    . ULNA OSTEOTOMY Right 02/12/2014   Procedure: RIGHT ULNAR SHORTENING AND OSTEOTOMY ;  Surgeon: Jolyn Nap, MD;  Location: [facility];  Service: Orthopedics;  Laterality: Right;  . ULNAR NERVE TRANSPOSITION    . UPPER GASTROINTESTINAL ENDOSCOPY  2008 [facility]F ABD PAIN WEIGHT LOSS d100 v8 phen 12.5   NL  . WRIST SURGERY Right jan 2015   Dr. Edmonia Lynch    Prior to Admission medications   Medication Sig Start Date End Date Taking? Authorizing Provider  acetaminophen (TYLENOL) 500 MG tablet Take 500 mg by mouth 2 (two) times daily as needed (pain.).    Yes [provider]  Ascorbic Acid (VITAMIN C WITH ROSE HIPS) 500 MG tablet Take 500 mg by mouth daily.   Yes [provider]  aspirin EC 81 MG tablet Take 81 mg by mouth daily.   Yes [provider]  Cholecalciferol (VITAMIN D3 PO) Take 2 tablets by mouth daily.    Yes [provider]  cloNIDine (CATAPRES) 0.1 MG tablet Take 1 tablet (0.1 mg total) by mouth 3 (three) times daily. 11/16/19  Yes Fayrene Helper, MD  dimenhyDRINATE (DRAMAMINE) 50 MG tablet Take 50 mg by mouth every 8 (eight) hours as  needed for nausea (sour stomach).    Yes [provider]  fluticasone (FLONASE) 50 MCG/ACT nasal spray Place 2 sprays into both nostrils daily. 10/12/19  Yes Perlie Mayo, NP  hydrocortisone (ANUSOL-HC) 2.5 % rectal cream Place 1 application rectally 2 (two) times daily. 09/07/19  Yes Annitta Needs, NP  ibuprofen (ADVIL) 200 MG tablet Take 400 mg by mouth 3 (three) times daily as needed (pain.).   Yes [provider]  lisinopril (ZESTRIL) 10 MG tablet Take 1 tablet (10 mg total) by mouth daily. 10/12/19  Yes Perlie Mayo, NP  mirtazapine (REMERON) 15 MG tablet Take 1 tablet (15 mg total) by mouth at bedtime. 09/21/19  Yes Cloria Spring, MD  Multiple Vitamins-Minerals  (MULTIVITAMIN ADULTS 50+) TABS Take 1 tablet by mouth daily.   Yes [provider]  Omega-3 Fatty Acids (FISH OIL) 1000 MG CAPS Take 1,000 mg by mouth daily.    Yes [provider]  omeprazole (PRILOSEC) 20 MG capsule Take 20 mg by mouth daily.   Yes [provider]  polyethylene glycol-electrolytes (TRILYTE) 420 g solution Take 4,000 mLs by mouth as directed. 09/13/19  Yes Ceirra Belli L, MD  prazosin (MINIPRESS) 5 MG capsule Take 1 capsule (5 mg total) by mouth at bedtime. 09/21/19  Yes Cloria Spring, MD  QUEtiapine (SEROQUEL) 50 MG tablet Take 1 tablet (50 mg total) by mouth 4 (four) times daily. 09/21/19  Yes Cloria Spring, MD  senna (SENOKOT) 8.6 MG tablet Take 2 tablets by mouth 3 (three) times daily as needed for constipation.   Yes [provider]  Turmeric 400 MG CAPS Take 400 mg by mouth daily.    Yes [provider]    Allergies as of 09/13/2019 - Review Complete 09/07/2019  Allergen Reaction Noted  . Levofloxacin Nausea Only and Other (See Comments)   . Ciprofloxacin  06/13/2015  . Sulfa antibiotics  12/02/2012    Family History  Problem Relation Age of Onset  . Bladder Cancer Mother        rare form   . Cancer Sister        Metastatic abdominal  . Emotional abuse Sister   . Stroke Sister   . Emotional abuse Sister   . Heart failure Father   . Stroke Father   . Alcohol abuse Father   . Dementia Paternal Aunt   . Alcohol abuse Paternal Uncle   . Dementia Paternal Uncle   . Dementia Paternal Grandmother   . Stroke Paternal Grandmother   . ADD / ADHD Cousin   . Bipolar disorder Cousin   . Anxiety disorder Cousin   . Depression Cousin        Committed suicide  . Alcohol abuse Cousin   . OCD Cousin   . Paranoid behavior Cousin   . Brain cancer Maternal Grandfather   . Heart defect Other        FAMILY HX  . Colon cancer Maternal Uncle   . Colon polyps Neg Hx   . Drug abuse Neg Hx   . Schizophrenia Neg Hx   . Seizures  Neg Hx   . Sexual abuse Neg Hx   . Physical abuse Neg Hx     Social History   Socioeconomic History  . Marital status: Divorced    Spouse name: Not on file  . Number of children: Not on file  . Years of education: Not on file  . Highest education level: Not on file  Occupational History  . Not on file  Tobacco Use  . Smoking status: Former Smoker    Packs/day: 1.50    Years: 8.00    Pack years: 12.00    Types: Cigarettes    Quit date: 08/13/1986    Years since quitting: 33.3  . Smokeless tobacco: Never Used  . Tobacco comment: quit 20 + yrs ago  Substance and Sexual Activity  . Alcohol use: No    Alcohol/week: 0.0 standard drinks  . Drug use: No  . Sexual activity: Never  Other Topics Concern  . Not on file  Social History Narrative  . Not on file   Social Determinants of Health   Financial Resource Strain: Low Risk   . Difficulty of Paying Living Expenses: Not hard at all  Food Insecurity: No Food Insecurity  . Worried About Charity fundraiser in the Last Year: Never true  . Ran Out of Food in the Last Year: Never true  Transportation Needs: No Transportation Needs  . Lack of Transportation (Medical): No  . Lack of Transportation (Non-Medical): No  Physical Activity: Inactive  . Days of Exercise per Week: 0 days  . Minutes of Exercise per Session: 0 min  Stress: Stress Concern Present  . Feeling of Stress : Very much  Social Connections: Moderately Isolated  . Frequency of Communication with Friends and Family: More than three times a week  . Frequency of Social Gatherings with Friends and Family: Three times a week  . Attends Religious Services: Never  . Active Member of Clubs or Organizations: No  . Attends Archivist Meetings: Never  . Marital Status: Divorced  Human resources officer Violence:   . Fear of Current or Ex-Partner:   . Emotionally Abused:   Marland Kitchen Physically Abused:   . Sexually Abused:     Review of Systems: See HPI, otherwise negative  ROS   Physical Exam: BP 127/80   Pulse 70   Temp 98 F (36.7 C) (Oral)   Resp 20   Ht 5\' 10"  (1.778 m)   Wt 72.6 kg   SpO2 100%   BMI 22.96 kg/m  General:   Alert,  pleasant and cooperative in NAD Head:  Normocephalic and atraumatic. Neck:  Supple; Lungs:  Clear throughout to auscultation.    Heart:  Regular rate and rhythm. Abdomen:  Soft, nontender and nondistended. Normal bowel sounds, without guarding, and without rebound.   Neurologic:  Alert and  oriented x4;  grossly normal neurologically.  Impression/Plan:     BRBPR/screening for varices  PLAN: TCS/EGD TODAY. DISCUSSED PROCEDURE, BENEFITS, & RISKS: < 1% chance of medication reaction, bleeding, perforation, ASPIRATION, or rupture of spleen/liver requiring surgery to fix it and missed polyps < 1 cm 10-20% of the time.

## 2019-12-05 NOTE — Op Note (Signed)
[facility] Patient Name: Marc [name] Procedure Date: 12/05/2019 9:46 AM MRN: [id number] Date of Birth: 14-Aug-1955 Attending MD: Barney Drain MD, MD CSN: [id number] Age: 64 Admit Type: Outpatient Procedure:                Upper GI endoscopy, DIAGNOSTIC Indications:              Hepatitis rule out esophageal varices. JAN 2021: NO                            ETOH, NEG HEP VL, [name] ELASTOGRAPHY FEB 2021-NORMAL                            LIVER, kPa < 7. NO ADDITIONAL IMAGING NEEDED. USES                            ASA DAILY AND IBUPROFEN PRN. LAST EGD 2013:                            GASTRITIS. Providers:                Barney Drain MD, MD, Rosina Lowenstein, RN, Aram Candela Referring MD:             Norwood Levo. Simpson MD, MD Medicines:                Propofol per Anesthesia Complications:            No immediate complications. Estimated Blood Loss:     Estimated blood loss was minimal. Procedure:                Pre-Anesthesia Assessment:                           - Prior to the procedure, a History and Physical                            was performed, and patient medications and                            allergies were reviewed. The patient's tolerance of                            previous anesthesia was also reviewed. The risks                            and benefits of the procedure and the sedation                            options and risks were discussed with the patient.                            All questions were answered, and informed consent                            was obtained. Prior Anticoagulants: The patient has  taken no previous anticoagulant or antiplatelet                            agents except for aspirin. ASA Grade Assessment: II                            - A patient with mild systemic disease. After                            reviewing the risks and benefits, the patient was                            deemed in satisfactory  condition to undergo the                            procedure.                           After obtaining informed consent, the endoscope was                            passed under direct vision. Throughout the                            procedure, the patient's blood pressure, pulse, and                            oxygen saturations were monitored continuously. The                            [id number]) scope was introduced through the                            mouth, and advanced to the second part of duodenum.                            The upper GI endoscopy was accomplished without                            difficulty. The patient tolerated the procedure                            well. Scope In: 9:53:00 AM Scope Out: 9:56:45 AM Total Procedure Duration: 0 hours 3 minutes 45 seconds  Findings:      The examined esophagus was normal.      Localized mild inflammation characterized by congestion (edema) and       erythema was found on the greater curvature of the stomach and in the       gastric antrum.      Localized mild inflammation characterized by congestion (edema) and       erythema was found in the duodenal bulb.      The second portion of the duodenum was normal. Impression:               - MILD Gastritis/Duodenitis DUE TO  ASA/NSAIDs. Moderate Sedation:      Per Anesthesia Care Recommendation:           - Patient has a contact number available for                            emergencies. The signs and symptoms of potential                            delayed complications were discussed with the                            patient. Return to normal activities tomorrow.                            Written discharge instructions were provided to the                            patient.                           - High fiber diet.                           - Continue present medications.                           - Return to GI clinic in 6 months.                            - LIVER US IMPROVED AFTER HEP C TREATMENT. NO                            EVIDENCE FOR CIRRHOSIS. NO ADDITIONAL IMAGING                            NEEDED. DISCUSSED WITH PT PRIOR TO SEDATION. Procedure Code(s):        --- Professional ---                           [phone], Esophagogastroduodenoscopy, flexible,                            transoral; diagnostic, including collection of                            specimen(s) by brushing or washing, when performed                            (separate procedure) Diagnosis Code(s):        --- Professional ---                           K29.70, Gastritis, unspecified, without bleeding                           K29.80, Duodenitis without bleeding  K75.9, Inflammatory liver disease, unspecified CPT copyright 2019 American Medical Association. All rights reserved. The codes documented in this report are preliminary and upon coder review may  be revised to meet current compliance requirements. Barney Drain, MD Barney Drain MD, MD 12/05/2019 10:23:59 AM This report has been signed electronically. Number of Addenda: 0

## 2019-12-05 NOTE — Discharge Instructions (Signed)
Your prep was not good. YOU had MULTIPLE SEEDS in your colon THAT CLOGGED THE SCOPE. I HAD TO CHANGE THE SCOPE AND THE SAME THING HAPPENED. SO YOUR COLONOSCOPY EXAM IS INCOMPLETE. I COULD NOT SEE THE LAST THIRD OF YOUR COLON. YOU HAD ONE SMALL POLYP REMOVED. YOU DO NOT HAVE ESOPHAGEAL VARICES.   DRINK WATER TO KEEP YOUR URINE LIGHT YELLOW.  FOLLOW A HIGH FIBER DIET. AVOID ITEMS THAT CAUSE BLOATING & GAS. SEE INFO BELOW.   YOUR BIOPSY RESULTS WILL BE BACK IN 5 BUSINESS DAYS.  YOU WILL NEED A COLONOSCOPY within the next 3 MOS.  YOU DO NOT NEED ANOTHER UPPER ENDOSCOPY JUST TO SCREEN FOR VARICES.  Colonoscopy Care After Read the instructions outlined below and refer to this sheet in the next week. These discharge instructions provide you with general information on caring for yourself after you leave the hospital. While your treatment has been planned according to the most current medical practices available, unavoidable complications occasionally occur. If you have any problems or questions after discharge, call DR. FIELDS, [phone].  ACTIVITY  You may resume your regular activity, but move at a slower pace for the next 24 hours.   Take frequent rest periods for the next 24 hours.   Walking will help get rid of the air and reduce the bloated feeling in your belly (abdomen).   No driving for 24 hours (because of the medicine (anesthesia) used during the test).   You may shower.   Do not sign any important legal documents or operate any machinery for 24 hours (because of the anesthesia used during the test).    NUTRITION  Drink plenty of fluids.   You may resume your normal diet as instructed by your doctor.   Begin with a light meal and progress to your normal diet. Heavy or fried foods are harder to digest and may make you feel sick to your stomach (nauseated).   Avoid alcoholic beverages for 24 hours or as instructed.    MEDICATIONS  You may resume your normal  medications.   WHAT YOU CAN EXPECT TODAY  Some feelings of bloating in the abdomen.   Passage of more gas than usual.   Spotting of blood in your stool or on the toilet paper  .  IF YOU HAD POLYPS REMOVED DURING THE COLONOSCOPY:  Eat a soft diet IF YOU HAVE NAUSEA, BLOATING, ABDOMINAL PAIN, OR VOMITING.    FINDING OUT THE RESULTS OF YOUR TEST Not all test results are available during your visit. DR. Oneida Alar WILL CALL YOU WITHIN 14 DAYS OF YOUR PROCEDUE WITH YOUR RESULTS. Do not assume everything is normal if you have not heard from DR. FIELDS, CALL HER OFFICE AT [phone].  SEEK IMMEDIATE MEDICAL ATTENTION AND CALL THE OFFICE: [phone] IF:  You have more than a spotting of blood in your stool.   Your belly is swollen (abdominal distention).   You are nauseated or vomiting.   You have a temperature over 101F.   You have abdominal pain or discomfort that is severe or gets worse throughout the day.   High-Fiber Diet A high-fiber diet changes your normal diet to include more whole grains, legumes, fruits, and vegetables. Changes in the diet involve replacing refined carbohydrates with unrefined foods. The calorie level of the diet is essentially unchanged. The Dietary Reference Intake (recommended amount) for adult males is 38 grams per day. For adult females, it is 25 grams per day. Pregnant and lactating women should consume 28  grams of fiber per day. Fiber is the intact part of a plant that is not broken down during digestion. Functional fiber is fiber that has been isolated from the plant to provide a beneficial effect in the body.  PURPOSE Increase stool bulk.  Ease and regulate bowel movements.  Lower cholesterol.  REDUCE RISK OF COLON CANCER  INDICATIONS THAT YOU NEED MORE FIBER Constipation and hemorrhoids.  Uncomplicated diverticulosis (intestine condition) and irritable bowel syndrome.  Weight management.  As a protective measure against hardening of the  arteries (atherosclerosis), diabetes, and cancer.   GUIDELINES FOR INCREASING FIBER IN THE DIET Start adding fiber to the diet slowly. A gradual increase of about 5 more grams (2 servings of most fruits or vegetables) per day is best. Too rapid an increase in fiber may result in constipation, flatulence, and bloating.  Drink enough water and fluids to keep your urine clear or pale yellow. Water, juice, or caffeine-free drinks are recommended. Not drinking enough fluid may cause constipation.  Eat a variety of high-fiber foods rather than one type of fiber.  Try to increase your intake of fiber through using high-fiber foods rather than fiber pills or supplements that contain small amounts of fiber.  The goal is to change the types of food eaten. Do not supplement your present diet with high-fiber foods, but replace foods in your present diet.    Polyps, Colon  A polyp is extra tissue that grows inside your body. Colon polyps grow in the large intestine. The large intestine, also called the colon, is part of your digestive system. It is a long, hollow tube at the end of your digestive tract where your body makes and stores stool. Most polyps are not dangerous. They are benign. This means they are not cancerous. But over time, some types of polyps can turn into cancer. Polyps that are smaller than a pea are usually not harmful. But larger polyps could someday become or may already be cancerous. To be safe, doctors remove all polyps and test them.   PREVENTION There is not one sure way to prevent polyps. You might be able to lower your risk of getting them if you:  Eat more fruits and vegetables and less fatty food.   Do not smoke.   Avoid alcohol.   Exercise every day.   Lose weight if you are overweight.   Eating more calcium and folate can also lower your risk of getting polyps. Some foods that are rich in calcium are milk, cheese, and broccoli. Some foods that are rich in folate are  chickpeas, kidney beans, and spinach.    Monitored Anesthesia Care Anesthesia is a term that refers to techniques, procedures, and medicines that help a person stay safe and comfortable during a medical procedure. Monitored anesthesia care, or sedation, is one type of anesthesia. Your anesthesia specialist may recommend sedation if you will be having a procedure that does not require you to be unconscious, such as:  Cataract surgery.  A dental procedure.  A biopsy.  A colonoscopy. During the procedure, you may receive a medicine to help you relax (sedative). There are three levels of sedation:  Mild sedation. At this level, you may feel awake and relaxed. You will be able to follow directions.  Moderate sedation. At this level, you will be sleepy. You may not remember the procedure.  Deep sedation. At this level, you will be asleep. You will not remember the procedure. The more medicine you are given, the deeper  your level of sedation will be. Depending on how you respond to the procedure, the anesthesia specialist may change your level of sedation or the type of anesthesia to fit your needs. An anesthesia specialist will monitor you closely during the procedure. Let your health care provider know about:  Any allergies you have.  All medicines you are taking, including vitamins, herbs, eye drops, creams, and over-the-counter medicines.  Any use of steroids (by mouth or as a cream).  Any problems you or family members have had with sedatives and anesthetic medicines.  Any blood disorders you have.  Any surgeries you have had.  Any medical conditions you have, such as sleep apnea.  Whether you are pregnant or may be pregnant.  Any use of cigarettes, alcohol, or street drugs. What are the risks? Generally, this is a safe procedure. However, problems may occur, including:  Getting too much medicine (oversedation).  Nausea.  Allergic reaction to medicines.  Trouble  breathing. If this happens, a breathing tube may be used to help with breathing. It will be removed when you are awake and breathing on your own.  Heart trouble.  Lung trouble. Before the procedure Staying hydrated Follow instructions from your health care provider about hydration, which may include:  Up to 2 hours before the procedure - you may continue to drink clear liquids, such as water, clear fruit juice, black coffee, and plain tea. Eating and drinking restrictions Follow instructions from your health care provider about eating and drinking, which may include:  8 hours before the procedure - stop eating heavy meals or foods such as meat, fried foods, or fatty foods.  6 hours before the procedure - stop eating light meals or foods, such as toast or cereal.  6 hours before the procedure - stop drinking milk or drinks that contain milk.  2 hours before the procedure - stop drinking clear liquids. Medicines Ask your health care provider about:  Changing or stopping your regular medicines. This is especially important if you are taking diabetes medicines or blood thinners.  Taking medicines such as aspirin and ibuprofen. These medicines can thin your blood. Do not take these medicines before your procedure if your health care provider instructs you not to. Tests and exams  You will have a physical exam.  You may have blood tests done to show: ? How well your kidneys and liver are working. ? How well your blood can clot. General instructions  Plan to have someone take you home from the hospital or clinic.  If you will be going home right after the procedure, plan to have someone with you for 24 hours.  What happens during the procedure?  Your blood pressure, heart rate, breathing, level of pain and overall condition will be monitored.  An IV tube will be inserted into one of your veins.  Your anesthesia specialist will give you medicines as needed to keep you comfortable  during the procedure. This may mean changing the level of sedation.  The procedure will be performed. After the procedure  Your blood pressure, heart rate, breathing rate, and blood oxygen level will be monitored until the medicines you were given have worn off.  Do not drive for 24 hours if you received a sedative.  You may: ? Feel sleepy, clumsy, or nauseous. ? Feel forgetful about what happened after the procedure. ? Have a sore throat if you had a breathing tube during the procedure. ? Vomit. This information is not intended to replace advice  given to you by your health care provider. Make sure you discuss any questions you have with your health care provider. Document Revised: 07/09/2017 Document Reviewed: 11/17/2015 Elsevier Patient Education  [place].

## 2019-12-05 NOTE — Transfer of Care (Signed)
Immediate Anesthesia Transfer of Care Note  Patient: Marc [name]  Procedure(s) Performed: COLONOSCOPY WITH PROPOFOL (N/A ) ESOPHAGOGASTRODUODENOSCOPY (EGD) WITH PROPOFOL (N/A ) POLYPECTOMY  Patient Location: PACU  Anesthesia Type:General  Level of Consciousness: awake  Airway & Oxygen Therapy: Patient Spontanous Breathing  Post-op Assessment: Report given to RN  Post vital signs: Reviewed  Last Vitals:  Vitals Value Taken Time  BP 118/67 12/05/19 1004  Temp    Pulse 61 12/05/19 1006  Resp 19 12/05/19 1006  SpO2 99 % 12/05/19 1006  Vitals shown include unvalidated device data.  Last Pain:  Vitals:   12/05/19 0958  TempSrc:   PainSc: 0-No pain         Complications: No apparent anesthesia complications

## 2019-12-05 NOTE — Op Note (Signed)
[facility] Patient Name: Marc [name] Procedure Date: 12/05/2019 8:23 AM MRN: [id number] Date of Birth: Aug 11, 1955 Attending MD: Barney Drain MD, MD CSN: [id number] Age: 64 Admit Type: Outpatient Procedure:                Colonoscopy WITH COLD SNARE POLYPECTOMY, INCOMPLETE                            COLONOSCOPY DUE TO POOR PREP Indications:              Hematochezia Providers:                Barney Drain MD, MD, Rosina Lowenstein, RN, Nelma Rothman,                            Technician Referring MD:             Norwood Levo. Simpson MD, MD Medicines:                Propofol per Anesthesia Complications:            No immediate complications. Estimated Blood Loss:     Estimated blood loss was minimal. Procedure:                Pre-Anesthesia Assessment:                           - Prior to the procedure, a History and Physical                            was performed, and patient medications and                            allergies were reviewed. The patient's tolerance of                            previous anesthesia was also reviewed. The risks                            and benefits of the procedure and the sedation                            options and risks were discussed with the patient.                            All questions were answered, and informed consent                            was obtained. Prior Anticoagulants: The patient has                            taken no previous anticoagulant or antiplatelet                            agents except for aspirin. ASA Grade Assessment: II                            -  A patient with mild systemic disease. After                            reviewing the risks and benefits, the patient was                            deemed in satisfactory condition to undergo the                            procedure. After obtaining informed consent, the                            colonoscope was passed under direct vision.      Throughout the procedure, the patient's blood                            pressure, pulse, and oxygen saturations were                            monitored continuously. The [id number]                            scope was introduced through the anus and advanced                            to the CECUM. THE SCOEP WAS CLOGGED WITH SEEDS IN                            LUMEN. SCOPE WITHDRAWN AND REPLACED. THE 2ND SCOPE                            WAS ADVANCED TO THE ILEUM for evaluation. The                            colonoscopy was performed with difficulty due to                            poor bowel prep and a tortuous colon. UNABLE TO                            CLEAR SPLENIC FLEXURE TO RECTUM DUE TO RETAINED                            SEEDS/PARTICLUATE MATTER INTEH LUMEN. Successful                            completion of the procedure was aided by                            straightening and shortening the scope to obtain                            bowel loop reduction, lavage and COLOWRAP. The  patient tolerated the procedure well. The quality                            of the bowel preparation was poor. Scope In: 9:14:59 AM Scope Out: 9:45:10 AM Scope Withdrawal Time: 0 hours 25 minutes 35 seconds  Total Procedure Duration: 0 hours 30 minutes 11 seconds  Findings:      The terminal ileum appeared normal.      A 3 mm polyp was found in the ascending colon. The polyp was sessile.       The polyp was removed with a cold snare. Resection and retrieval were       complete.      External and internal hemorrhoids were found. Impression:               - Preparation of the colon was poor. COLONOSCOPY                            INCOMPLETE.                           - The examined portion of the ileum was normal.                           - One 3 mm polyp in the ascending colon, removed                            with a cold snare. Resected and retrieved.                            - External and internal hemorrhoids. Moderate Sedation:      Per Anesthesia Care Recommendation:           - Repeat colonoscopy at next available appointment                            (within 3 months) because the bowel preparation was                            poor. START LOW FIBER DIET 7 DAYS PRIOR TO NEXT                            COLONOSCOPY. START FULL LIQUIDS/MIRALAX PREP 2 DAYS                            PRIOR TO NEXT COLONOSCOPY. CLEAR LIQUIDS/TRILYTE                            PREP 1 DAY PRIOR TO NEXT COLONOSCOPY.                           - Patient has a contact number available for                            emergencies. The signs and symptoms of potential  delayed complications were discussed with the                            patient. Return to normal activities tomorrow.                            Written discharge instructions were provided to the                            patient.                           - High fiber diet.                           - Continue present medications.                           - Await pathology results.                           - Return to GI office in 6 months. Procedure Code(s):         Diagnosis Code(s):        --- Professional ---                           K64.8, Other hemorrhoids                           K63.5, Polyp of colon                           K92.1, Melena (includes Hematochezia) CPT copyright 2019 American Medical Association. All rights reserved. The codes documented in this report are preliminary and upon coder review may  be revised to meet current compliance requirements. Barney Drain, MD Barney Drain MD, MD 12/05/2019 10:17:50 AM This report has been signed electronically. Number of Addenda: 0

## 2019-12-05 NOTE — Anesthesia Preprocedure Evaluation (Signed)
Anesthesia Evaluation  Patient identified by MRN, date of birth, ID band Patient awake    Reviewed: Allergy & Precautions, NPO status , Patient's Chart, lab work & pertinent test results  History of Anesthesia Complications Negative for: history of anesthetic complications  Airway Mallampati: III  TM Distance: >3 FB Neck ROM: Full    Dental  (+) Missing, Dental Advisory Given, Caps   Pulmonary neg pulmonary ROS, former smoker,    Pulmonary exam normal breath sounds clear to auscultation       Cardiovascular Exercise Tolerance: Good hypertension, Pt. on medications Normal cardiovascular exam Rhythm:Regular Rate:Normal  29-Dec-2017 22:09:04 [facility] ROUTINE RECORD Sinus rhythm Consider left atrial enlargement Probable anteroseptal infarct, old similar to prior 4/14 Confirmed by Butler, Michael ([id number]) on 12/29/2017 10:15:18 PM   Neuro/Psych PSYCHIATRIC DISORDERS Anxiety Depression    GI/Hepatic GERD  Medicated and Controlled,(+)     substance abuse  , Hepatitis -, C  Endo/Other  negative endocrine ROS  Renal/GU negative Renal ROS     Musculoskeletal  (+) Arthritis  (gout), narcotic dependent  Abdominal   Peds  Hematology   Anesthesia Other Findings   Reproductive/Obstetrics                             Anesthesia Physical  Anesthesia Plan  ASA: III  Anesthesia Plan: General   Post-op Pain Management:    Induction: Intravenous  PONV Risk Score and Plan:   Airway Management Planned: Nasal Cannula, Natural Airway and Simple Face Mask  Additional Equipment:   Intra-op Plan:   Post-operative Plan:   Informed Consent: I have reviewed the patients History and Physical, chart, labs and discussed the procedure including the risks, benefits and alternatives for the proposed anesthesia with the patient or authorized representative who has indicated his/her  understanding and acceptance.     Dental advisory given  Plan Discussed with: CRNA and Surgeon  Anesthesia Plan Comments:         Anesthesia Quick Evaluation  

## 2019-12-05 NOTE — Anesthesia Postprocedure Evaluation (Signed)
Anesthesia Post Note  Patient: Marc [name]  Procedure(s) Performed: COLONOSCOPY WITH PROPOFOL (N/A ) ESOPHAGOGASTRODUODENOSCOPY (EGD) WITH PROPOFOL (N/A ) POLYPECTOMY  Anesthesia Type: General Level of consciousness: awake and alert Pain management: pain level controlled Vital Signs Assessment: post-procedure vital signs reviewed and stable Respiratory status: spontaneous breathing Cardiovascular status: blood pressure returned to baseline and stable Postop Assessment: no apparent nausea or vomiting Anesthetic complications: no     Last Vitals:  Vitals:   12/05/19 0720 12/05/19 1004  BP: 127/80 118/67  Pulse: 70 75  Resp: 20 12  Temp:  36.5 C  SpO2: 100% 100%    Last Pain:  Vitals:   12/05/19 1004  TempSrc:   PainSc: 0-No pain                 Jai Bear

## 2019-12-06 LAB — SURGICAL PATHOLOGY

## 2019-12-07 ENCOUNTER — Other Ambulatory Visit: Payer: Self-pay | Admitting: *Deleted

## 2019-12-07 DIAGNOSIS — I10 Essential (primary) hypertension: Secondary | ICD-10-CM

## 2019-12-07 MED ORDER — LISINOPRIL 10 MG PO TABS: 10.0000 mg | ORAL_TABLET | Freq: Every day | ORAL | 1 refills | Status: DC

## 2019-12-12 ENCOUNTER — Telehealth (INDEPENDENT_AMBULATORY_CARE_PROVIDER_SITE_OTHER): Payer: Medicare HMO | Admitting: Psychiatry

## 2019-12-12 ENCOUNTER — Encounter (HOSPITAL_COMMUNITY): Payer: Self-pay | Admitting: Psychiatry

## 2019-12-12 ENCOUNTER — Other Ambulatory Visit: Payer: Self-pay

## 2019-12-12 DIAGNOSIS — F331 Major depressive disorder, recurrent, moderate: Secondary | ICD-10-CM

## 2019-12-12 DIAGNOSIS — F4310 Post-traumatic stress disorder, unspecified: Secondary | ICD-10-CM | POA: Diagnosis not present

## 2019-12-12 DIAGNOSIS — I10 Essential (primary) hypertension: Secondary | ICD-10-CM

## 2019-12-12 MED ORDER — QUETIAPINE FUMARATE 50 MG PO TABS: 50.0000 mg | ORAL_TABLET | Freq: Four times a day (QID) | ORAL | 2 refills | Status: DC

## 2019-12-12 MED ORDER — CLONIDINE HCL 0.1 MG PO TABS: 0.1000 mg | ORAL_TABLET | Freq: Three times a day (TID) | ORAL | 3 refills | Status: DC

## 2019-12-12 MED ORDER — MIRTAZAPINE 15 MG PO TABS: 15.0000 mg | ORAL_TABLET | Freq: Every day | ORAL | 2 refills | Status: DC

## 2019-12-12 MED ORDER — PRAZOSIN HCL 5 MG PO CAPS: 5.0000 mg | ORAL_CAPSULE | Freq: Every day | ORAL | 1 refills | Status: DC

## 2019-12-12 NOTE — Progress Notes (Signed)
Virtual Visit via Telephone Note  I connected with Marc [name] on 12/12/19 at 11:00 AM EDT by telephone and verified that I am speaking with the correct person using two identifiers.   I discussed the limitations, risks, security and privacy concerns of performing an evaluation and management service by telephone and the availability of in person appointments. I also discussed with the patient that there may be a patient responsible charge related to this service. The patient expressed understanding and agreed to proceed.     I discussed the assessment and treatment plan with the patient. The patient was provided an opportunity to ask questions and all were answered. The patient agreed with the plan and demonstrated an understanding of the instructions.   The patient was advised to call back or seek an in-person evaluation if the symptoms worsen or if the condition fails to improve as anticipated.  I provided 15 minutes of non-face-to-face time during this encounter.   [name], MD  [facility] MD/PA/NP OP Progress Note  12/12/2019 11:23 AM Marc [name]  MRN:  [id number]  Chief Complaint:  Chief Complaint    Depression; Anxiety; Follow-up     HPI: This patient is a 64 year old divorced white male who lives alone in [place].  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  He has 1 son in [place].  The patient returns for follow-up after 3 months.  He states that for the most part he continues to do well.  He has been doing a lot of health maintenance things and recently had a colonoscopy and endoscopy.  Unfortunately he did not have a good cleanout history keep the colonoscopy.  He also is developing a cataract in his eye which is giving him some trouble.  Otherwise his health has been pretty good and his mood has been stable.  He is sleeping well at night without nightmares.  He denies significant mood swings depression or suicidal ideation.  He has  continued to be very active in [place] and has not relapsed at all.  He denies any thoughts of self-harm or suicide. Visit Diagnosis:    ICD-10-CM   1. Major depressive disorder, recurrent, moderate (HCC)  F33.1   2. Essential hypertension  I10 cloNIDine (CATAPRES) 0.1 MG tablet  3. PTSD (post-traumatic stress disorder)  F43.10 prazosin (MINIPRESS) 5 MG capsule    Past Psychiatric History: Prior hospitalization for alcohol detox about 1 year ago  Past Medical History:  Past Medical History:  Diagnosis Date  . Acute encephalopathy 12/29/2017   Hospitalized 5/22 to 5/23 with acute encephalopathy, unclear etiology  . Allergy   . Anxiety   . Arthritis   . Back problem   . Depression   . Epigastric pain 07/14/2012  . GERD (gastroesophageal reflux disease)   . Gout   . Hepatitis C 1979  . Hepatitis C reactive    LIVER BX 2008-CHRONIC ACTIVE HEPATITIS  . HTN (hypertension)   . Hx of adenomatous colonic polyps 2008   due for surveillance 2017  . Hypercholesterolemia   . IBS (irritable bowel syndrome)   . Irritable bowel syndrome 01/04/2009   Qualifier: Diagnosis of  By: Westly Pam.   . Knee pain   . Leukopenia 11/06/2015  . Normal cardiac stress test 05/25/2011   [facility] [place]  . Normal echocardiogram 05/25/2011   EF 55%, mild TR  . RUQ pain 07/14/2012  . Substance abuse ([place])    narcotic addiction  .  Thrombocytopenia ([place]) 11/06/2015    Past Surgical History:  Procedure Laterality Date  . APPENDECTOMY    . CARPAL TUNNEL RELEASE     rt  . COLONOSCOPY  2008 [facility] ARS D100 V8 PHEN 12.5   2 SIMPLE ADENOMAS (< 1 CM)  . COLONOSCOPY WITH PROPOFOL N/A 12/05/2019   Procedure: COLONOSCOPY WITH PROPOFOL;  Surgeon: Danie Binder, MD;  [facility];  Service: Endoscopy;  Laterality: N/A;  8:15am  . ESOPHAGOGASTRODUODENOSCOPY  04/26/2012   Dr. Oneida Alar: Non-erosive gastritis (inflammation) was found in the gastric antrum but no H.pylori; multiple biopsies  (duodenal bx negative for Celiac)/ The mucosa of the esophagus appeared normal  . ESOPHAGOGASTRODUODENOSCOPY (EGD) WITH PROPOFOL N/A 12/05/2019   Procedure: ESOPHAGOGASTRODUODENOSCOPY (EGD) WITH PROPOFOL;  Surgeon: Danie Binder, MD;  [facility];  Service: Endoscopy;  Laterality: N/A;  . EUS  09/08/2012   Dr. Ardis Hughs: CBD dilated but no stones. Query secondary to Sphincter of Oddi stenosis, ?dysfunction, but clinically without symptoms  . HARDWARE REMOVAL Right 02/12/2014   Procedure: HARDWARE REMOVAL;  Surgeon: Jolyn Nap, MD;  Location: [facility];  Service: Orthopedics;  Laterality: Right;  . KNEE ARTHROSCOPY    . Neurostimulator implant    . POLYPECTOMY  12/05/2019   Procedure: POLYPECTOMY;  Surgeon: Danie Binder, MD;  [facility];  Service: Endoscopy;;  . right ring finger    . spinal stenosis, had screws put in neck  04/11/2009   DR KRITZER  . TONSILLECTOMY    . ULNA OSTEOTOMY Right 02/12/2014   Procedure: RIGHT ULNAR SHORTENING AND OSTEOTOMY ;  Surgeon: Jolyn Nap, MD;  Location: [facility];  Service: Orthopedics;  Laterality: Right;  . ULNAR NERVE TRANSPOSITION    . UPPER GASTROINTESTINAL ENDOSCOPY  2008 [facility] ABD PAIN WEIGHT LOSS d100 v8 phen 12.5   NL  . WRIST SURGERY Right jan 2015   Dr. Edmonia Lynch    Family Psychiatric History: See below  Family History:  Family History  Problem Relation Age of Onset  . Bladder Cancer Mother        rare form   . Cancer Sister        Metastatic abdominal  . Emotional abuse Sister   . Stroke Sister   . Emotional abuse Sister   . Heart failure Father   . Stroke Father   . Alcohol abuse Father   . Dementia Paternal Aunt   . Alcohol abuse Paternal Uncle   . Dementia Paternal Uncle   . Dementia Paternal Grandmother   . Stroke Paternal Grandmother   . ADD / ADHD Cousin   . Bipolar disorder Cousin   . Anxiety disorder Cousin   . Depression Cousin        Committed  suicide  . Alcohol abuse Cousin   . OCD Cousin   . Paranoid behavior Cousin   . Brain cancer Maternal Grandfather   . Heart defect Other        FAMILY HX  . Colon cancer Maternal Uncle   . Colon polyps Neg Hx   . Drug abuse Neg Hx   . Schizophrenia Neg Hx   . Seizures Neg Hx   . Sexual abuse Neg Hx   . Physical abuse Neg Hx     Social History:  Social History   Socioeconomic History  . Marital status: Divorced    Spouse name: Not on file  . Number of children: Not on file  .  Years of education: Not on file  . Highest education level: Not on file  Occupational History  . Not on file  Tobacco Use  . Smoking status: Former Smoker    Packs/day: 1.50    Years: 8.00    Pack years: 12.00    Types: Cigarettes    Quit date: 08/13/1986    Years since quitting: 33.3  . Smokeless tobacco: Never Used  . Tobacco comment: quit 20 + yrs ago  Substance and Sexual Activity  . Alcohol use: No    Alcohol/week: 0.0 standard drinks  . Drug use: No  . Sexual activity: Never  Other Topics Concern  . Not on file  Social History Narrative  . Not on file   Social Determinants of Health   Financial Resource Strain: Low Risk   . Difficulty of Paying Living Expenses: Not hard at all  Food Insecurity: No Food Insecurity  . Worried About Charity fundraiser in the Last Year: Never true  . Ran Out of Food in the Last Year: Never true  Transportation Needs: No Transportation Needs  . Lack of Transportation (Medical): No  . Lack of Transportation (Non-Medical): No  Physical Activity: Inactive  . Days of Exercise per Week: 0 days  . Minutes of Exercise per Session: 0 min  Stress: Stress Concern Present  . Feeling of Stress : Very much  Social Connections: Moderately Isolated  . Frequency of Communication with Friends and Family: More than three times a week  . Frequency of Social Gatherings with Friends and Family: Three times a week  . Attends Religious Services: Never  . Active Member  of Clubs or Organizations: No  . Attends Archivist Meetings: Never  . Marital Status: Divorced    Allergies:  Allergies  Allergen Reactions  . Levofloxacin Nausea Only and Other (See Comments)    "Creepy" feeling, skin didn't feel right, sort of itchy.  . Ciprofloxacin     Other reaction(s): Sweating (intolerance)  . Sulfa Antibiotics     Metabolic Disorder Labs: Lab Results  Component Value Date   HGBA1C 5.5 12/30/2017   MPG 111.15 12/30/2017   MPG 111 10/22/2016   No results found for: PROLACTIN Lab Results  Component Value Date   CHOL 189 08/16/2019   TRIG 65 08/16/2019   HDL 44 08/16/2019   CHOLHDL 4.3 08/16/2019   VLDL 27 10/22/2016   LDLCALC 129 (H) 08/16/2019   LDLCALC 167 (H) 05/03/2019   Lab Results  Component Value Date   TSH 2.01 05/03/2019   TSH 2.056 12/30/2017    Therapeutic Level Labs: No results found for: LITHIUM No results found for: VALPROATE No components found for:  CBMZ  Current Medications: Current Outpatient Medications  Medication Sig Dispense Refill  . acetaminophen (TYLENOL) 500 MG tablet Take 500 mg by mouth 2 (two) times daily as needed (pain.).     Marland Kitchen Ascorbic Acid (VITAMIN C WITH ROSE HIPS) 500 MG tablet Take 500 mg by mouth daily.    Marland Kitchen aspirin EC 81 MG tablet Take 81 mg by mouth daily.    . Cholecalciferol (VITAMIN D3 PO) Take 2 tablets by mouth daily.     . cloNIDine (CATAPRES) 0.1 MG tablet Take 1 tablet (0.1 mg total) by mouth 3 (three) times daily. 90 tablet 3  . dimenhyDRINATE (DRAMAMINE) 50 MG tablet Take 50 mg by mouth every 8 (eight) hours as needed for nausea (sour stomach).     . fluticasone (FLONASE) 50 MCG/ACT nasal  spray Place 2 sprays into both nostrils daily. 16 g 1  . hydrocortisone (ANUSOL-HC) 2.5 % rectal cream Place 1 application rectally 2 (two) times daily. 30 g 1  . ibuprofen (ADVIL) 200 MG tablet Take 400 mg by mouth 3 (three) times daily as needed (pain.).    Marland Kitchen lisinopril (ZESTRIL) 10 MG tablet  Take 1 tablet (10 mg total) by mouth daily. 30 tablet 1  . mirtazapine (REMERON) 15 MG tablet Take 1 tablet (15 mg total) by mouth at bedtime. 30 tablet 2  . Multiple Vitamins-Minerals (MULTIVITAMIN ADULTS 50+) TABS Take 1 tablet by mouth daily.    . Omega-3 Fatty Acids (FISH OIL) 1000 MG CAPS Take 1,000 mg by mouth daily.     Marland Kitchen omeprazole (PRILOSEC) 20 MG capsule Take 20 mg by mouth daily.    . prazosin (MINIPRESS) 5 MG capsule Take 1 capsule (5 mg total) by mouth at bedtime. 30 capsule 1  . QUEtiapine (SEROQUEL) 50 MG tablet Take 1 tablet (50 mg total) by mouth 4 (four) times daily. 120 tablet 2  . senna (SENOKOT) 8.6 MG tablet Take 2 tablets by mouth 3 (three) times daily as needed for constipation.    . Turmeric 400 MG CAPS Take 400 mg by mouth daily.      No current facility-administered medications for this visit.     Musculoskeletal: Strength & Muscle Tone: within normal limits Gait & Station: normal Patient leans: N/A  Psychiatric Specialty Exam: Review of Systems  Musculoskeletal: Positive for myalgias.  All other systems reviewed and are negative.   There were no vitals taken for this visit.There is no height or weight on file to calculate BMI.  General Appearance: NA  Eye Contact:  Good  Speech:  Clear and Coherent  Volume:  Normal  Mood:  Euthymic  Affect:  NA  Thought Process:  Goal Directed  Orientation:  Full (Time, Place, and Person)  Thought Content: WDL   Suicidal Thoughts:  No  Homicidal Thoughts:  No  Memory:  Immediate;   Good Recent;   Good Remote;   Good  Judgement:  Good  Insight:  Good  Psychomotor Activity:  Normal  Concentration:  Concentration: Good and Attention Span: Good  Recall:  Good  Fund of Knowledge: Good  Language: Good  Akathisia:  No  Handed:  Right  AIMS (if indicated): not done  Assets:  Communication Skills Desire for Improvement Resilience Social Support Talents/Skills  ADL's:  Intact  Cognition: WNL  Sleep:  Good    Screenings: PHQ2-9     Office Visit from 10/12/2019 in [facility] Office Visit from 09/14/2019 in [facility] Office Visit from 08/15/2019 in [facility] Office Visit from 04/11/2019 in [facility] Office Visit from 02/28/2019 in [facility]  PHQ-2 Total Score  1  0  1  3  4   PHQ-9 Total Score  7  --  10  14  20        Assessment and Plan: This patient is a 64 year old male with a long history of depression and alcohol abuse.  He seems to be doing well in terms of his mood.  He will continue mirtazapine 15 mg at bedtime for depression and sleep, prazosin 5 mg at bedtime for nightmares and Seroquel 50 mg 4 times daily for mood stabilization.  He will return to see me in 3 months   [name], MD 12/12/2019, 11:23 AM

## 2019-12-13 ENCOUNTER — Encounter: Payer: Self-pay | Admitting: *Deleted

## 2019-12-14 DIAGNOSIS — H2513 Age-related nuclear cataract, bilateral: Secondary | ICD-10-CM | POA: Diagnosis not present

## 2019-12-14 DIAGNOSIS — H43811 Vitreous degeneration, right eye: Secondary | ICD-10-CM | POA: Diagnosis not present

## 2019-12-14 DIAGNOSIS — H1045 Other chronic allergic conjunctivitis: Secondary | ICD-10-CM | POA: Diagnosis not present

## 2020-01-02 ENCOUNTER — Ambulatory Visit (INDEPENDENT_AMBULATORY_CARE_PROVIDER_SITE_OTHER): Payer: Medicare HMO | Admitting: Otolaryngology

## 2020-01-02 ENCOUNTER — Encounter (INDEPENDENT_AMBULATORY_CARE_PROVIDER_SITE_OTHER): Payer: Self-pay | Admitting: Otolaryngology

## 2020-01-02 ENCOUNTER — Other Ambulatory Visit: Payer: Self-pay

## 2020-01-02 VITALS — Temp 97.9°F

## 2020-01-02 DIAGNOSIS — J310 Chronic rhinitis: Secondary | ICD-10-CM

## 2020-01-02 NOTE — Progress Notes (Signed)
HPI: Marc [name] is a 64 y.o. male who returns today for evaluation of chronic postnasal drainage.  He describes chronic drainage in his throat frequently where he has to spit.  He has been using nasal steroid spray Flonase every night and has tried saline rinses during the day such as simply saline.  He cannot find the Xlear brand saline rinse. The symptoms have been going on for about 9 or 10 months. He has seen allergist in the past and used to take allergy shots a number of years ago.. I reviewed a CT scan of his head performed 2 years ago and this showed clear paranasal sinuses.  Past Medical History:  Diagnosis Date  . Acute encephalopathy 12/29/2017   Hospitalized 5/22 to 5/23 with acute encephalopathy, unclear etiology  . Allergy   . Anxiety   . Arthritis   . Back problem   . Depression   . Epigastric pain 07/14/2012  . GERD (gastroesophageal reflux disease)   . Gout   . Hepatitis C 1979  . Hepatitis C reactive    LIVER BX 2008-CHRONIC ACTIVE HEPATITIS  . HTN (hypertension)   . Hx of adenomatous colonic polyps 2008   due for surveillance 2017  . Hypercholesterolemia   . IBS (irritable bowel syndrome)   . Irritable bowel syndrome 01/04/2009   Qualifier: Diagnosis of  By: Westly Pam.   . Knee pain   . Leukopenia 11/06/2015  . Normal cardiac stress test 05/25/2011   [facility]  . Normal echocardiogram 05/25/2011   EF 55%, mild TR  . RUQ pain 07/14/2012  . Substance abuse ([place])    narcotic addiction  . Thrombocytopenia ([place]) 11/06/2015   Past Surgical History:  Procedure Laterality Date  . APPENDECTOMY    . CARPAL TUNNEL RELEASE     rt  . COLONOSCOPY  2008 SLF ARS D100 V8 PHEN 12.5   2 SIMPLE ADENOMAS (< 1 CM)  . COLONOSCOPY WITH PROPOFOL N/A 12/05/2019   Procedure: COLONOSCOPY WITH PROPOFOL;  Surgeon: Danie Binder, MD;  [facility];  Service: Endoscopy;  Laterality: N/A;  8:15am  . ESOPHAGOGASTRODUODENOSCOPY  04/26/2012    Dr. Oneida Alar: Non-erosive gastritis (inflammation) was found in the gastric antrum but no H.pylori; multiple biopsies (duodenal bx negative for Celiac)/ The mucosa of the esophagus appeared normal  . ESOPHAGOGASTRODUODENOSCOPY (EGD) WITH PROPOFOL N/A 12/05/2019   Procedure: ESOPHAGOGASTRODUODENOSCOPY (EGD) WITH PROPOFOL;  Surgeon: Danie Binder, MD;  [facility];  Service: Endoscopy;  Laterality: N/A;  . EUS  09/08/2012   Dr. Ardis Hughs: CBD dilated but no stones. Query secondary to Sphincter of Oddi stenosis, ?dysfunction, but clinically without symptoms  . HARDWARE REMOVAL Right 02/12/2014   Procedure: HARDWARE REMOVAL;  Surgeon: Jolyn Nap, MD;  Location: [place];  Service: Orthopedics;  Laterality: Right;  . KNEE ARTHROSCOPY    . Neurostimulator implant    . POLYPECTOMY  12/05/2019   Procedure: POLYPECTOMY;  Surgeon: Danie Binder, MD;  [facility];  Service: Endoscopy;;  . right ring finger    . spinal stenosis, had screws put in neck  04/11/2009   DR KRITZER  . TONSILLECTOMY    . ULNA OSTEOTOMY Right 02/12/2014   Procedure: RIGHT ULNAR SHORTENING AND OSTEOTOMY ;  Surgeon: Jolyn Nap, MD;  Location: [place];  Service: Orthopedics;  Laterality: Right;  . ULNAR NERVE TRANSPOSITION    . UPPER GASTROINTESTINAL ENDOSCOPY  2008 SLF ABD  PAIN WEIGHT LOSS d100 v8 phen 12.5   NL  . WRIST SURGERY Right jan 2015   Dr. Edmonia Lynch   Social History   Socioeconomic History  . Marital status: Divorced    Spouse name: Not on file  . Number of children: Not on file  . Years of education: Not on file  . Highest education level: Not on file  Occupational History  . Not on file  Tobacco Use  . Smoking status: Former Smoker    Packs/day: 1.50    Years: 8.00    Pack years: 12.00    Types: Cigarettes    Quit date: 08/13/1986    Years since quitting: 33.4  . Smokeless tobacco: Never Used  . Tobacco comment: quit 20 + yrs ago   Substance and Sexual Activity  . Alcohol use: No    Alcohol/week: 0.0 standard drinks  . Drug use: No  . Sexual activity: Never  Other Topics Concern  . Not on file  Social History Narrative  . Not on file   Social Determinants of Health   Financial Resource Strain: Low Risk   . Difficulty of Paying Living Expenses: Not hard at all  Food Insecurity: No Food Insecurity  . Worried About Charity fundraiser in the Last Year: Never true  . Ran Out of Food in the Last Year: Never true  Transportation Needs: No Transportation Needs  . Lack of Transportation (Medical): No  . Lack of Transportation (Non-Medical): No  Physical Activity: Inactive  . Days of Exercise per Week: 0 days  . Minutes of Exercise per Session: 0 min  Stress: Stress Concern Present  . Feeling of Stress : Very much  Social Connections: Moderately Isolated  . Frequency of Communication with Friends and Family: More than three times a week  . Frequency of Social Gatherings with Friends and Family: Three times a week  . Attends Religious Services: Never  . Active Member of Clubs or Organizations: No  . Attends Archivist Meetings: Never  . Marital Status: Divorced   Family History  Problem Relation Age of Onset  . Bladder Cancer Mother        rare form   . Cancer Sister        Metastatic abdominal  . Emotional abuse Sister   . Stroke Sister   . Emotional abuse Sister   . Heart failure Father   . Stroke Father   . Alcohol abuse Father   . Dementia Paternal Aunt   . Alcohol abuse Paternal Uncle   . Dementia Paternal Uncle   . Dementia Paternal Grandmother   . Stroke Paternal Grandmother   . ADD / ADHD Cousin   . Bipolar disorder Cousin   . Anxiety disorder Cousin   . Depression Cousin        Committed suicide  . Alcohol abuse Cousin   . OCD Cousin   . Paranoid behavior Cousin   . Brain cancer Maternal Grandfather   . Heart defect Other        FAMILY HX  . Colon cancer Maternal Uncle    . Colon polyps Neg Hx   . Drug abuse Neg Hx   . Schizophrenia Neg Hx   . Seizures Neg Hx   . Sexual abuse Neg Hx   . Physical abuse Neg Hx    Allergies  Allergen Reactions  . Levofloxacin Nausea Only and Other (See Comments)    "Creepy" feeling, skin didn't feel right, sort of  itchy.  . Ciprofloxacin     Other reaction(s): Sweating (intolerance)  . Sulfa Antibiotics    Prior to Admission medications   Medication Sig Start Date End Date Taking? Authorizing Provider  acetaminophen (TYLENOL) 500 MG tablet Take 500 mg by mouth 2 (two) times daily as needed (pain.).    Yes [provider]  Ascorbic Acid (VITAMIN C WITH ROSE HIPS) 500 MG tablet Take 500 mg by mouth daily.   Yes [provider]  aspirin EC 81 MG tablet Take 81 mg by mouth daily.   Yes [provider]  Cholecalciferol (VITAMIN D3 PO) Take 2 tablets by mouth daily.    Yes [provider]  dimenhyDRINATE (DRAMAMINE) 50 MG tablet Take 50 mg by mouth every 8 (eight) hours as needed for nausea (sour stomach).    Yes [provider]  fluticasone (FLONASE) 50 MCG/ACT nasal spray Place 2 sprays into both nostrils daily. 10/12/19  Yes Perlie Mayo, NP  hydrocortisone (ANUSOL-HC) 2.5 % rectal cream Place 1 application rectally 2 (two) times daily. 09/07/19  Yes Annitta Needs, NP  ibuprofen (ADVIL) 200 MG tablet Take 400 mg by mouth 3 (three) times daily as needed (pain.).   Yes [provider]  lisinopril (ZESTRIL) 10 MG tablet Take 1 tablet (10 mg total) by mouth daily. 12/07/19  Yes Fayrene Helper, MD  mirtazapine (REMERON) 15 MG tablet Take 1 tablet (15 mg total) by mouth at bedtime. 12/12/19  Yes Cloria Spring, MD  Multiple Vitamins-Minerals (MULTIVITAMIN ADULTS 50+) TABS Take 1 tablet by mouth daily.   Yes [provider]  Omega-3 Fatty Acids (FISH OIL) 1000 MG CAPS Take 1,000 mg by mouth daily.    Yes [provider]  omeprazole (PRILOSEC) 20 MG capsule  Take 20 mg by mouth daily.   Yes [provider]  prazosin (MINIPRESS) 5 MG capsule Take 1 capsule (5 mg total) by mouth at bedtime. 12/12/19  Yes Cloria Spring, MD  QUEtiapine (SEROQUEL) 50 MG tablet Take 1 tablet (50 mg total) by mouth 4 (four) times daily. 12/12/19  Yes Cloria Spring, MD  senna (SENOKOT) 8.6 MG tablet Take 2 tablets by mouth 3 (three) times daily as needed for constipation.   Yes [provider]  Turmeric 400 MG CAPS Take 400 mg by mouth daily.    Yes [provider]     Positive ROS: Otherwise negative  All other systems have been reviewed and were otherwise negative with the exception of those mentioned in the HPI and as above.  Physical Exam: Constitutional: Alert, well-appearing, no acute distress Ears: External ears without lesions or tenderness. Ear canals are clear bilaterally with intact, clear TMs.  Nasal: External nose without lesions. Septum with mild deformity.  Both middle meatus regions were clear with no evidence of discharge or infection.  No polyps.. Clear nasal passages otherwise. Oral: Lips and gums without lesions. Tongue and palate mucosa without lesions. Posterior oropharynx clear.  No drainage noted in the posterior oropharynx. Neck: No palpable adenopathy or masses Respiratory: Breathing comfortably  Skin: No facial/neck lesions or rash noted.  Procedures  Assessment: Chronic rhinitis Questionable sinus disease  Plan: Recommended continue use of the Flonase 2 sprays each nostril at night as well as a saline rinse as needed. We will plan on scheduling a CT scan of the sinuses to see if he has any sinus obstruction or chronic sinus disease. He will call us back following the CT scan of  the sinuses.   Radene Journey, MD

## 2020-01-03 ENCOUNTER — Other Ambulatory Visit (INDEPENDENT_AMBULATORY_CARE_PROVIDER_SITE_OTHER): Payer: Self-pay

## 2020-01-03 DIAGNOSIS — J329 Chronic sinusitis, unspecified: Secondary | ICD-10-CM

## 2020-01-05 ENCOUNTER — Encounter: Payer: Self-pay | Admitting: Gastroenterology

## 2020-01-05 ENCOUNTER — Ambulatory Visit (INDEPENDENT_AMBULATORY_CARE_PROVIDER_SITE_OTHER): Payer: Medicare HMO | Admitting: Gastroenterology

## 2020-01-05 ENCOUNTER — Other Ambulatory Visit: Payer: Self-pay

## 2020-01-05 VITALS — BP 136/78 | HR 63 | Temp 97.3°F | Ht 70.0 in | Wt 155.8 lb

## 2020-01-05 DIAGNOSIS — K219 Gastro-esophageal reflux disease without esophagitis: Secondary | ICD-10-CM

## 2020-01-05 DIAGNOSIS — K5900 Constipation, unspecified: Secondary | ICD-10-CM | POA: Diagnosis not present

## 2020-01-05 DIAGNOSIS — R140 Abdominal distension (gaseous): Secondary | ICD-10-CM

## 2020-01-05 MED ORDER — HYDROCORTISONE ACETATE 25 MG RE SUPP
25.0000 mg | Freq: Two times a day (BID) | RECTAL | 1 refills | Status: DC
Start: 2020-01-05 — End: 2020-05-28

## 2020-01-05 NOTE — Progress Notes (Signed)
Referring Provider: Fayrene Helper, MD Primary Care Physician:  Fayrene Helper, MD  Primary GI: previously Dr. Oneida Alar   Chief Complaint  Patient presents with  . Hemorrhoids    itching hemorrhoids, constipation    HPI:   DENYM MIDDAUGH is a 64 y.o. male presenting today with a history of Hep C genotype 1a, elastography F3/F4 s/p treatment in past with Harvoni and documented SVR most recently. Prior to treatment, elastography was F3/F4. Now most consistent with F2. EGD recently with mild gastritis/duodenitis. No varices. Per Dr. Oneida Alar, no further [name] surveillance needed. Colonoscopy with poor prep in April 2021, polyp removed but not retrieved. Will need soft diet 7 days prior, full liquids 2 days prior, clear liquids 1 day prior.    Linzess 290 mcg samples given at last appt. He can't remember how this worked for him. Has felt very bloated and constipated. BM once every few days. If eats a lot of berries will help. Hemorrhoids itching but no blood.   Feels full but always hungry. Some nausea in the morning. No vomiting. Just a little upset. Eating smaller amounts.   Weight 155.8 today. Jan 2021 was 170. Weight fluctuates, with documented weight in upper 150s last year as well.    Past Medical History:  Diagnosis Date  . Acute encephalopathy 12/29/2017   Hospitalized 5/22 to 5/23 with acute encephalopathy, unclear etiology  . Allergy   . Anxiety   . Arthritis   . Back problem   . Depression   . Epigastric pain 07/14/2012  . GERD (gastroesophageal reflux disease)   . Gout   . Hepatitis C 1979  . Hepatitis C reactive    LIVER BX 2008-CHRONIC ACTIVE HEPATITIS  . HTN (hypertension)   . Hx of adenomatous colonic polyps 2008   due for surveillance 2017  . Hypercholesterolemia   . IBS (irritable bowel syndrome)   . Irritable bowel syndrome 01/04/2009   Qualifier: Diagnosis of  By: Westly Pam.   . Knee pain   . Leukopenia 11/06/2015  . Normal cardiac  stress test 05/25/2011   [facility]  . Normal echocardiogram 05/25/2011   EF 55%, mild TR  . RUQ pain 07/14/2012  . Substance abuse ([place])    narcotic addiction  . Thrombocytopenia ([place]) 11/06/2015    Past Surgical History:  Procedure Laterality Date  . APPENDECTOMY    . CARPAL TUNNEL RELEASE     rt  . COLONOSCOPY  2008 SLF ARS D100 V8 PHEN 12.5   2 SIMPLE ADENOMAS (< 1 CM)  . COLONOSCOPY WITH PROPOFOL N/A 12/05/2019   TI normal, 3 mm polyp in ascending colon, external and internal hemorrhoids. Polyp removed but not retrieved. Poor prep  . ESOPHAGOGASTRODUODENOSCOPY  04/26/2012   Dr. Oneida Alar: Non-erosive gastritis (inflammation) was found in the gastric antrum but no H.pylori; multiple biopsies (duodenal bx negative for Celiac)/ The mucosa of the esophagus appeared normal  . ESOPHAGOGASTRODUODENOSCOPY (EGD) WITH PROPOFOL N/A 12/05/2019   mild gastritis, duodenitis due to aspirin/NSAIDs.   . EUS  09/08/2012   Dr. Ardis Hughs: CBD dilated but no stones. Query secondary to Sphincter of Oddi stenosis, ?dysfunction, but clinically without symptoms  . HARDWARE REMOVAL Right 02/12/2014   Procedure: HARDWARE REMOVAL;  Surgeon: Jolyn Nap, MD;  Location: [place];  Service: Orthopedics;  Laterality: Right;  . KNEE ARTHROSCOPY    . Neurostimulator implant    . POLYPECTOMY  12/05/2019   Procedure:  POLYPECTOMY;  Surgeon: Danie Binder, MD;  [facility];  Service: Endoscopy;;  . right ring finger    . spinal stenosis, had screws put in neck  04/11/2009   DR KRITZER  . TONSILLECTOMY    . ULNA OSTEOTOMY Right 02/12/2014   Procedure: RIGHT ULNAR SHORTENING AND OSTEOTOMY ;  Surgeon: Jolyn Nap, MD;  Location: [place];  Service: Orthopedics;  Laterality: Right;  . ULNAR NERVE TRANSPOSITION    . UPPER GASTROINTESTINAL ENDOSCOPY  2008 SLF ABD PAIN WEIGHT LOSS d100 v8 phen 12.5   NL  . WRIST SURGERY Right jan 2015   Dr. Edmonia Lynch     Current Outpatient Medications  Medication Sig Dispense Refill  . acetaminophen (TYLENOL) 500 MG tablet Take 500 mg by mouth as needed (pain.).     Marland Kitchen Ascorbic Acid (VITAMIN C WITH ROSE HIPS) 500 MG tablet Take 500 mg by mouth daily.    Marland Kitchen aspirin EC 81 MG tablet Take 81 mg by mouth daily.    . Cholecalciferol (VITAMIN D3 PO) Take 4 tablets by mouth daily. Takes 4 tablets of Vitamin D3 (1000) daily.    . cloNIDine (CATAPRES) 0.1 MG tablet Take 0.1 mg by mouth 3 (three) times daily.    Marland Kitchen dimenhyDRINATE (DRAMAMINE) 50 MG tablet Take 50 mg by mouth as needed for nausea (sour stomach).     . Ferrous Gluconate-C-Folic Acid (IRON-C PO) Take by mouth daily.    . fluticasone (FLONASE) 50 MCG/ACT nasal spray Place 2 sprays into both nostrils daily. 16 g 1  . hydrocortisone (ANUSOL-HC) 2.5 % rectal cream Place 1 application rectally 2 (two) times daily. (Patient taking differently: Place 1 application rectally as needed. ) 30 g 1  . ibuprofen (ADVIL) 200 MG tablet Take 400 mg by mouth 3 (three) times daily as needed (pain.).    Marland Kitchen lisinopril (ZESTRIL) 10 MG tablet Take 1 tablet (10 mg total) by mouth daily. 30 tablet 1  . mirtazapine (REMERON) 15 MG tablet Take 1 tablet (15 mg total) by mouth at bedtime. 30 tablet 2  . Multiple Vitamins-Minerals (MULTIVITAMIN ADULTS 50+) TABS Take 1 tablet by mouth daily.    . Omega-3 Fatty Acids (FISH OIL) 1000 MG CAPS Take 1,000 mg by mouth daily.     Marland Kitchen omeprazole (PRILOSEC) 20 MG capsule Take 20 mg by mouth daily.    . prazosin (MINIPRESS) 5 MG capsule Take 1 capsule (5 mg total) by mouth at bedtime. 30 capsule 1  . QUEtiapine (SEROQUEL) 50 MG tablet Take 1 tablet (50 mg total) by mouth 4 (four) times daily. 120 tablet 2  . senna (SENOKOT) 8.6 MG tablet Take 2 tablets by mouth 3 (three) times daily as needed for constipation.    . Turmeric 400 MG CAPS Take 400 mg by mouth daily.      No current facility-administered medications for this visit.    Allergies as of  01/05/2020 - Review Complete 01/05/2020  Allergen Reaction Noted  . Levofloxacin Nausea Only and Other (See Comments)   . Ciprofloxacin  06/13/2015  . Sulfa antibiotics  12/02/2012    Family History  Problem Relation Age of Onset  . Bladder Cancer Mother        rare form   . Cancer Sister        Metastatic abdominal  . Emotional abuse Sister   . Stroke Sister   . Emotional abuse Sister   . Heart failure Father   . Stroke Father   .  Alcohol abuse Father   . Dementia Paternal Aunt   . Alcohol abuse Paternal Uncle   . Dementia Paternal Uncle   . Dementia Paternal Grandmother   . Stroke Paternal Grandmother   . ADD / ADHD Cousin   . Bipolar disorder Cousin   . Anxiety disorder Cousin   . Depression Cousin        Committed suicide  . Alcohol abuse Cousin   . OCD Cousin   . Paranoid behavior Cousin   . Brain cancer Maternal Grandfather   . Heart defect Other        FAMILY HX  . Colon cancer Maternal Uncle   . Colon polyps Neg Hx   . Drug abuse Neg Hx   . Schizophrenia Neg Hx   . Seizures Neg Hx   . Sexual abuse Neg Hx   . Physical abuse Neg Hx     Social History   Socioeconomic History  . Marital status: Divorced    Spouse name: Not on file  . Number of children: Not on file  . Years of education: Not on file  . Highest education level: Not on file  Occupational History  . Not on file  Tobacco Use  . Smoking status: Former Smoker    Packs/day: 1.50    Years: 8.00    Pack years: 12.00    Types: Cigarettes    Quit date: 08/13/1986    Years since quitting: 33.4  . Smokeless tobacco: Never Used  . Tobacco comment: quit 20 + yrs ago  Substance and Sexual Activity  . Alcohol use: No    Alcohol/week: 0.0 standard drinks  . Drug use: No  . Sexual activity: Never  Other Topics Concern  . Not on file  Social History Narrative  . Not on file   Social Determinants of Health   Financial Resource Strain: Low Risk   . Difficulty of Paying Living Expenses: Not  hard at all  Food Insecurity: No Food Insecurity  . Worried About Charity fundraiser in the Last Year: Never true  . Ran Out of Food in the Last Year: Never true  Transportation Needs: No Transportation Needs  . Lack of Transportation (Medical): No  . Lack of Transportation (Non-Medical): No  Physical Activity: Inactive  . Days of Exercise per Week: 0 days  . Minutes of Exercise per Session: 0 min  Stress: Stress Concern Present  . Feeling of Stress : Very much  Social Connections: Moderately Isolated  . Frequency of Communication with Friends and Family: More than three times a week  . Frequency of Social Gatherings with Friends and Family: Three times a week  . Attends Religious Services: Never  . Active Member of Clubs or Organizations: No  . Attends Archivist Meetings: Never  . Marital Status: Divorced    Review of Systems: Gen: see HPI CV: Denies chest pain, palpitations, syncope, peripheral edema, and claudication. Resp: Denies dyspnea at rest, cough, wheezing, coughing up blood, and pleurisy. GI: see HPI Derm: Denies rash, itching, dry skin Psych: Denies depression, anxiety, memory loss, confusion. No homicidal or suicidal ideation.  Heme: Denies bruising, bleeding, and enlarged lymph nodes.  Physical Exam: BP 136/78   Pulse 63   Temp (!) 97.3 F (36.3 C) (Temporal)   Ht 5\' 10"  (1.778 m)   Wt 155 lb 12.8 oz (70.7 kg)   BMI 22.35 kg/m  General:   Alert and oriented. No distress noted. Pleasant and cooperative.  Head:  Normocephalic and atraumatic. Eyes:  Conjuctiva clear without scleral icterus. Lungs: clear bilaterally Cardiac: S1 S2 present without murmurs Mouth:  Mask in place Abdomen:  +BS, soft, non-tender and non-distended. No rebound or guarding. No HSM or masses noted. Msk:  Symmetrical without gross deformities. Normal posture. Extremities:  Without edema. Neurologic:  Alert and  oriented x4 Psych:  Alert and cooperative. Normal mood and  affect.  ASSESSMENT: Marc [name] is a 64 y.o. male presenting today with history of Hep C genotype 1a s/p treatment in past with documented SVR, previously F3/F4 score on elastography and now F2 s/p treatment, no varices on EGD recently, history of adenomas in 2008 and overdue for surveillance, chronic constipation, symptomatic hemorrhoids, and GERD.  Colonoscopy April 2021 with poor prep and needs to be arranged again.  History of Hep C: eradicated. At last visit I had discussed with patient considering yearly ultrasounds due to history of elevated fibrosis score, but he has no stigmata of advanced liver disease, with F2 on recent elastography, no varices on recent EGD. Per Dr. Oneida Alar' recommendations, no need for yearly ultrasounds.   History of adenomas in 2008: poor prep April 2021. Will modify prep and diet prior.   Constipation: trial again of Linzess 290 mcg daily. I have provided samples. Discussed importance of calling with update.  Symptomatic hemorrhoids: may be a banding candidate. Mild itching currently. Follow-up after TCS.  Reporting bloating: likely due to constipation. If persists despite bowel regimen, consider GES as he does note vague early satiety, mild nausea in morning.  GERD: continue Prilosec daily.   Weight loss: Weight 155.8 today. Jan 2021 was 170. Weight fluctuates, with documented weight in upper 150s last year as well. Monitor. If persistent, needs more dedicated imaging.    PLAN:    Proceed with TCS with Dr. Gala Romney in near future using Propofol: the risks, benefits, and alternatives have been discussed with the patient in detail. The patient states understanding and desires to proceed. Low fiber diet a week prior, full liquids 2 days prior, clear liquids 1 day prior.   Linzess 290 mcg daily: samples provided. Call next week with update  Consider GES if persistent bloating. EGD already on file  Monitor for further weight loss  Follow-up after  colonoscopy to discuss banding  Continue Prilosec daily

## 2020-01-05 NOTE — Patient Instructions (Addendum)
We are arranging a colonoscopy in the future with Dr. Gala Romney. You will do a soft diet starting one week prior, then full liquids 2 days prior, then clear liquids as normal before colonoscopy.   I would like for you to try Linzess 1 capsule each morning on an empty stomach. You may have loose stool starting out, but it should get better. If not, let me know. Please call with an update, as we need you to be on a bowel regimen that's good.   Let me know if persistent bloating despite better bowel habits. I have included a handout. We will do a gastric emptying study if persistent bloating.   If you continue to lose weight, please call.  We will see you in follow-up after the procedures for possible banding!  I enjoyed seeing you again today! As you know, I value our relationship and want to provide genuine, compassionate, and quality care. I welcome your feedback. If you receive a survey regarding your visit,  I greatly appreciate you taking time to fill this out. See you next time!  Annitta Needs, PhD, ANP-BC [facility]   Abdominal Bloating When you have abdominal bloating, your abdomen may feel full, tight, or painful. It may also look bigger than normal or swollen (distended). Common causes of abdominal bloating include:  Swallowing air.  Constipation.  Problems digesting food.  Eating too much.  Irritable bowel syndrome. This is a condition that affects the large intestine.  Lactose intolerance. This is an inability to digest lactose, a natural sugar in dairy products.  Celiac disease. This is a condition that affects the ability to digest gluten, a protein found in some grains.  Gastroparesis. This is a condition that slows down the movement of food in the stomach and small intestine. It is more common in people with diabetes mellitus.  Gastroesophageal reflux disease (GERD). This is a digestive condition that makes stomach acid flow back into the  esophagus.  Urinary retention. This means that the body is holding onto urine, and the bladder cannot be emptied all the way. Follow these instructions at home: Eating and drinking  Avoid eating too much.  Try not to swallow air while talking or eating.  Avoid eating while lying down.  Avoid these foods and drinks: ? Foods that cause gas, such as broccoli, cabbage, cauliflower, and baked beans. ? Carbonated drinks. ? Hard candy. ? Chewing gum. Medicines  Take over-the-counter and prescription medicines only as told by your health care provider.  Take probiotic medicines. These medicines contain live bacteria or yeasts that can help digestion.  Take coated peppermint oil capsules. Activity  Try to exercise regularly. Exercise may help to relieve bloating that is caused by gas and relieve constipation. General instructions  Keep all follow-up visits as told by your health care provider. This is important. Contact a health care provider if:  You have nausea and vomiting.  You have diarrhea.  You have abdominal pain.  You have unusual weight loss or weight gain.  You have severe pain, and medicines do not help. Get help right away if:  You have severe chest pain.  You have trouble breathing.  You have shortness of breath.  You have trouble urinating.  You have darker urine than normal.  You have blood in your stools or have dark, tarry stools. Summary  Abdominal bloating means that the abdomen is swollen.  Common causes of abdominal bloating are swallowing air, constipation, and problems digesting food.  Avoid  eating too much and avoid swallowing air.  Avoid foods that cause gas, carbonated drinks, hard candy, and chewing gum. This information is not intended to replace advice given to you by your health care provider. Make sure you discuss any questions you have with your health care provider. Document Revised: 11/14/2018 Document Reviewed:  08/28/2016 Elsevier Patient Education  [place].

## 2020-01-06 ENCOUNTER — Other Ambulatory Visit: Payer: Self-pay | Admitting: Family Medicine

## 2020-01-06 DIAGNOSIS — I10 Essential (primary) hypertension: Secondary | ICD-10-CM

## 2020-01-09 ENCOUNTER — Telehealth: Payer: Self-pay | Admitting: Emergency Medicine

## 2020-01-09 ENCOUNTER — Telehealth: Payer: Self-pay | Admitting: Gastroenterology

## 2020-01-09 ENCOUNTER — Other Ambulatory Visit: Payer: Self-pay | Admitting: Gastroenterology

## 2020-01-09 DIAGNOSIS — K5900 Constipation, unspecified: Secondary | ICD-10-CM

## 2020-01-09 MED ORDER — LUBIPROSTONE 24 MCG PO CAPS: 24.0000 ug | ORAL_CAPSULE | Freq: Two times a day (BID) | ORAL | 3 refills | Status: DC

## 2020-01-09 NOTE — Telephone Encounter (Signed)
error 

## 2020-01-09 NOTE — Telephone Encounter (Signed)
He will need to take this twice daily with meals. Be sure to take with food to prevent nausea. Let us know if this is too expensive.

## 2020-01-09 NOTE — Telephone Encounter (Signed)
Noted. I will send in a Rx to his pharmacy.

## 2020-01-09 NOTE — Progress Notes (Signed)
Cc'ed to pcp °

## 2020-01-09 NOTE — Telephone Encounter (Signed)
have amitiza 33mcg samples. Unfortunately, we don't have any 54mcg

## 2020-01-09 NOTE — Telephone Encounter (Signed)
Pt called and stated linzess 280mcg is not working for him. States about an hour after he takes linzess it makes him dizzy and light headed. He also states he is taking sennosides 8.6mg  (2) several times a day and he is still having bloating. He wants to know if something else can be sent into [facility] for him, he usually sees AB but she is off until 6/3

## 2020-01-09 NOTE — Telephone Encounter (Signed)
Called Pt he stated he has never been on amitiza 66mcg bid

## 2020-01-09 NOTE — Telephone Encounter (Signed)
[phone]  Please call patient   linzess not working like he thought it should. Still going a day or two without a bowel movement. Is also light headed

## 2020-01-09 NOTE — Telephone Encounter (Signed)
Has he ever been on Amitiza 24 mcg BID?

## 2020-01-09 NOTE — Telephone Encounter (Signed)
Ok. Do we have samples? If not, I will send a Rx for him to try.

## 2020-01-10 ENCOUNTER — Other Ambulatory Visit (HOSPITAL_COMMUNITY): Payer: Self-pay | Admitting: Psychiatry

## 2020-01-10 DIAGNOSIS — F4310 Post-traumatic stress disorder, unspecified: Secondary | ICD-10-CM

## 2020-01-10 NOTE — Telephone Encounter (Signed)
Called notified pt, rx was sent into [facility]

## 2020-01-11 ENCOUNTER — Telehealth: Payer: Self-pay | Admitting: Gastroenterology

## 2020-01-11 MED ORDER — LINACLOTIDE 290 MCG PO CAPS: 290.0000 ug | ORAL_CAPSULE | Freq: Every day | ORAL | 3 refills | Status: DC

## 2020-01-11 NOTE — Addendum Note (Signed)
Addended by: Annitta Needs on: 01/11/2020 01:54 PM   Modules accepted: Orders

## 2020-01-11 NOTE — Telephone Encounter (Signed)
Patient called and said that the linzess is actually working and would like a prescription sent to his pharmacy

## 2020-01-11 NOTE — Telephone Encounter (Signed)
Completed.

## 2020-01-11 NOTE — Telephone Encounter (Signed)
LMOM for pt to call me back.

## 2020-01-11 NOTE — Telephone Encounter (Signed)
Pt says Linzess 290 mcg is actually working.  Would like RX sent to [place] please.

## 2020-01-19 ENCOUNTER — Ambulatory Visit
Admission: RE | Admit: 2020-01-19 | Discharge: 2020-01-19 | Disposition: A | Discharge status: Home / Self Care | Payer: Medicare HMO | Source: Ambulatory Visit | Attending: Otolaryngology | Admitting: Otolaryngology

## 2020-01-19 ENCOUNTER — Other Ambulatory Visit: Payer: Self-pay

## 2020-01-19 DIAGNOSIS — J329 Chronic sinusitis, unspecified: Secondary | ICD-10-CM

## 2020-01-26 ENCOUNTER — Telehealth (INDEPENDENT_AMBULATORY_CARE_PROVIDER_SITE_OTHER): Payer: Self-pay | Admitting: Otolaryngology

## 2020-01-26 NOTE — Telephone Encounter (Signed)
Called to [place] today concerning results of his CT scan of the sinuses. This showed mostly clear paranasal sinuses except for some minimal mucus within the right frontal region which was not obstructed. He also has some slight mucosal thickening along the floor the right maxillary sinus. However sinus openings were not obstructed and nasal cavity was otherwise clear. Suggested regular use of nasal steroid spray Nasacort or Flonase as well as saline irrigation. No signs of infection where antibiotics would be beneficial. If he is clearing his throat a lot he could try short course of Prilosec as laryngeal pharyngeal reflux can contribute to chronic throat clearing. Could also visit with an allergist for further allergy testing.

## 2020-02-13 ENCOUNTER — Ambulatory Visit: Payer: Medicare HMO | Admitting: Family Medicine

## 2020-02-19 ENCOUNTER — Telehealth: Payer: Self-pay | Admitting: *Deleted

## 2020-02-19 NOTE — Telephone Encounter (Signed)
Called pt. He is scheduled for procedure 8/13 at 8:15am. Patient aware will need covid test prior. Advised will mail this with his instructions. Confirmed mailing address [id number] [place]. miralax prep instructions mailed. Also have included soft diet and full liquid diet sheet

## 2020-02-20 ENCOUNTER — Telehealth: Payer: Self-pay | Admitting: *Deleted

## 2020-02-20 NOTE — Telephone Encounter (Signed)
PA for TCS approved via [employer]. Auth# [id number] dates 03/22/2020-04/21/2020

## 2020-02-29 ENCOUNTER — Ambulatory Visit (INDEPENDENT_AMBULATORY_CARE_PROVIDER_SITE_OTHER): Payer: Medicare HMO | Admitting: Family Medicine

## 2020-02-29 ENCOUNTER — Encounter: Payer: Self-pay | Admitting: Family Medicine

## 2020-02-29 ENCOUNTER — Other Ambulatory Visit: Payer: Self-pay

## 2020-02-29 VITALS — BP 110/72 | HR 75 | Temp 97.2°F | Resp 18 | Ht 70.0 in | Wt 148.4 lb

## 2020-02-29 DIAGNOSIS — H6123 Impacted cerumen, bilateral: Secondary | ICD-10-CM | POA: Insufficient documentation

## 2020-02-29 DIAGNOSIS — I10 Essential (primary) hypertension: Secondary | ICD-10-CM

## 2020-02-29 DIAGNOSIS — K649 Unspecified hemorrhoids: Secondary | ICD-10-CM | POA: Insufficient documentation

## 2020-02-29 NOTE — Patient Instructions (Addendum)
I appreciate the opportunity to provide you with care for your health and wellness. Today we discussed: flushing ears, and overall health   Follow up: Oct for annual exam-morning fasting appt   No labs or referrals today  So glad you are sleeping better, losing weight -by eating better and overall taking better care of yourself!  Please continue to practice social distancing to keep you, your family, and our community safe.  If you must go out, please wear a mask and practice good handwashing.  It was a pleasure to see you and I look forward to continuing to work together on your health and well-being. Please do not hesitate to call the office if you need care or have questions about your care.  Have a wonderful day and week. With Gratitude, Cherly Beach, DNP, AGNP-BC

## 2020-02-29 NOTE — Progress Notes (Signed)
Subjective:  Patient ID: Marc [name], male    DOB: 1956-03-03  Age: 64 y.o. MRN: [id number]  CC:  Chief Complaint  Patient presents with  . Follow-up    3 month follow up also having drainage in his throat doesnt know what it is coming from       HPI  HPI  Marc [name] is a 64 year old male patient of Dr. Griffin Dakin.  He presents for 51-month follow-up.  Reports he had some drainage in his throat and he was not sure what was coming from.  But he has had some muffled hearing.  And was wondering if it was from his ears.  He has suffered from nasal drainage in the past as well.  Recently went to the beach but otherwise has not had any changes in his environment.  He reports that he is sleeping okay.  Is not taking Minipress anymore.  Has really tried to wean off some of his medications.  He is not watching the TV as much anymore and his mindset is little bit better and he reports that he does still have nightmares but overall is doing better with this.  He is eating good and trying to eat very healthy.  He has lost about 8 pounds since he was last seen.  He denies having any changes in bowel or bladder habits.  Denies having any memory issues trauma or injury.  Denies having any skin issues.  Thinks his glasses need to be rechecked.  Has been told has had cataracts but has not been seen in a while.  Denies having chest pain, cough, shortness of breath, headaches, exposure to Covid that he is aware of.  Is willing to have his ears flushed today if needed.  Today patient denies signs and symptoms of COVID 19 infection including fever, chills, cough, shortness of breath, and headache. Past Medical, Surgical, Social History, Allergies, and Medications have been Reviewed.   Past Medical History:  Diagnosis Date  . Acute encephalopathy 12/29/2017   Hospitalized 5/22 to 5/23 with acute encephalopathy, unclear etiology  . Acute gout involving toe of right foot 09/14/2019  . Allergy   . Anxiety   .  Arthritis   . Back problem   . Chest wall pain 03/28/2017  . Chronic hepatitis C without hepatic coma ([place]) 01/04/2009   Qualifier: Diagnosis of  By: Westly Pam. 2008: CT A/P NO CIRRHOSIS AUG 2013 MRCP- DILATED CBD/NO CIRRHOSIS DEC 2013 AST 99-102  ALT 145-156 PLT 147 HB 13.4 ALB 4.4-4.7 T BIL 0.7   . Depression   . Dilation of biliary tract 07/14/2012   EUS JAN 2014 BENIGN CBD DILATION   . Epigastric pain 07/14/2012  . GERD (gastroesophageal reflux disease)   . Gout   . HCV (hepatitis C virus) 06/16/2015  . Hepatitis C 1979  . Hepatitis C reactive    LIVER BX 2008-CHRONIC ACTIVE HEPATITIS  . HTN (hypertension)   . Hx of adenomatous colonic polyps 2008   due for surveillance 2017  . Hypercholesterolemia   . IBS (irritable bowel syndrome)   . Irritable bowel syndrome 01/04/2009   Qualifier: Diagnosis of  By: Westly Pam.   . Knee pain   . Left forearm pain 08/15/2017  . Leukopenia 11/06/2015  . Narcotic addiction [facility]) 06/10/2011   [facility]- Oct 2012   . Normal cardiac stress test 05/25/2011   [facility] [place]  . Normal echocardiogram 05/25/2011  EF 55%, mild TR  . RUQ pain 07/14/2012  . Substance abuse ([place])    narcotic addiction  . Thrombocytopenia ([place]) 11/06/2015  . Trigger middle finger of left hand 06/20/2012    Current Meds  Medication Sig  . acetaminophen (TYLENOL) 500 MG tablet Take 500 mg by mouth as needed (pain.).   Marland Kitchen Ascorbic Acid (VITAMIN C WITH ROSE HIPS) 500 MG tablet Take 500 mg by mouth daily.  Marland Kitchen aspirin EC 81 MG tablet Take 81 mg by mouth daily.  . Cholecalciferol (VITAMIN D3 PO) Take 4 tablets by mouth daily. Takes 4 tablets of Vitamin D3 (1000) daily.  . cloNIDine (CATAPRES) 0.1 MG tablet Take 0.1 mg by mouth 3 (three) times daily.  . Ferrous Gluconate-C-Folic Acid (IRON-C PO) Take by mouth daily.  . fluticasone (FLONASE) 50 MCG/ACT nasal spray Place 2 sprays into both nostrils daily.  . hydrocortisone  (ANUSOL-HC) 2.5 % rectal cream Place 1 application rectally 2 (two) times daily. (Patient taking differently: Place 1 application rectally as needed. )  . hydrocortisone (ANUSOL-HC) 25 MG suppository Place 1 suppository (25 mg total) rectally every 12 (twelve) hours.  Marland Kitchen ibuprofen (ADVIL) 200 MG tablet Take 400 mg by mouth 3 (three) times daily as needed (pain.).  Marland Kitchen linaclotide (LINZESS) 290 MCG CAPS capsule Take 1 capsule (290 mcg total) by mouth daily before breakfast.  . lisinopril (ZESTRIL) 10 MG tablet TAKE ONE TABLET BY MOUTH ONCE DAILY.  Marland Kitchen lubiprostone (AMITIZA) 24 MCG capsule Take 1 capsule (24 mcg total) by mouth 2 (two) times daily with a meal.  . mirtazapine (REMERON) 15 MG tablet Take 1 tablet (15 mg total) by mouth at bedtime.  . Multiple Vitamins-Minerals (MULTIVITAMIN ADULTS 50+) TABS Take 1 tablet by mouth daily.  . Nutritional Supplements (KETO PO) Take by mouth daily.  . Omega-3 Fatty Acids (FISH OIL) 1000 MG CAPS Take 1,000 mg by mouth daily.   Marland Kitchen omeprazole (PRILOSEC) 20 MG capsule Take 20 mg by mouth daily.  . prazosin (MINIPRESS) 5 MG capsule TAKE (1) CAPSULE BY MOUTH AT BEDTIME.  . QUEtiapine (SEROQUEL) 50 MG tablet Take 1 tablet (50 mg total) by mouth 4 (four) times daily.  Marland Kitchen senna (SENOKOT) 8.6 MG tablet Take 2 tablets by mouth 3 (three) times daily as needed for constipation.  . Turmeric 400 MG CAPS Take 400 mg by mouth daily.     ROS:  Review of Systems  Constitutional: Negative.   HENT: Positive for hearing loss.   Eyes: Negative.   Respiratory: Negative.   Cardiovascular: Negative.   Gastrointestinal: Negative.   Genitourinary: Negative.   Musculoskeletal: Negative.   Skin: Negative.   Neurological: Negative.   Endo/Heme/Allergies: Negative.   Psychiatric/Behavioral: Negative.   All other systems reviewed and are negative.    Objective:   Today's Vitals: BP 110/72 (BP Location: Right Arm, Patient Position: Sitting, Cuff Size: Normal)   Pulse 75   Temp  (!) 97.2 F (36.2 C) (Temporal)   Resp 18   Ht 5\' 10"  (1.778 m)   Wt 148 lb 6.4 oz (67.3 kg)   SpO2 97%   BMI 21.29 kg/m  Vitals with BMI 02/29/2020 01/05/2020 12/05/2019  Height 5\' 10"  5\' 10"  -  Weight 148 lbs 6 oz 155 lbs 13 oz -  BMI 64.40 34.74 -  Systolic 259 563 875  Diastolic 72 78 76  Pulse 75 63 80  Some encounter information is confidential and restricted. Go to Review Flowsheets activity to see all data.  Physical Exam Vitals and nursing note reviewed.  Constitutional:      Appearance: Normal appearance. He is well-developed, well-groomed and normal weight.  HENT:     Head: Normocephalic and atraumatic.     Right Ear: External ear normal. There is impacted cerumen.     Left Ear: External ear normal. There is impacted cerumen.     Mouth/Throat:     Comments: Mask in place  Eyes:     General:        Right eye: No discharge.        Left eye: No discharge.     Conjunctiva/sclera: Conjunctivae normal.  Cardiovascular:     Rate and Rhythm: Normal rate and regular rhythm.     Pulses: Normal pulses.     Heart sounds: Normal heart sounds.  Pulmonary:     Effort: Pulmonary effort is normal.     Breath sounds: Normal breath sounds.  Musculoskeletal:        General: Normal range of motion.     Cervical back: Normal range of motion and neck supple.  Skin:    General: Skin is warm.  Neurological:     General: No focal deficit present.     Mental Status: He is alert and oriented to person, place, and time.  Psychiatric:        Attention and Perception: Attention normal.        Mood and Affect: Mood normal.        Speech: Speech normal.        Behavior: Behavior normal. Behavior is cooperative.        Thought Content: Thought content normal.        Cognition and Memory: Cognition normal.        Judgment: Judgment normal.     Ear Flushing in office: Bilateral irrigation today in the office.   Assessment   1. Essential hypertension   2. Bilateral impacted  cerumen     Tests ordered No orders of the defined types were placed in this encounter.   Plan: Please see assessment and plan per problem list above.   No orders of the defined types were placed in this encounter.   Patient to follow-up in 03/04/2020   Perlie Mayo, NP

## 2020-02-29 NOTE — Assessment & Plan Note (Signed)
Bilateral ear irrigation.  Successfully performed.

## 2020-02-29 NOTE — Assessment & Plan Note (Signed)
Marc [name] is encouraged to maintain a well balanced diet that is low in salt. Controlled, continue current medication regimen. Additionally, he also reminded that exercise is beneficial for heart health and control of Blood pressure. 30-60 minutes daily is recommended-walking was suggested.

## 2020-03-04 ENCOUNTER — Other Ambulatory Visit: Payer: Self-pay

## 2020-03-04 ENCOUNTER — Telehealth (INDEPENDENT_AMBULATORY_CARE_PROVIDER_SITE_OTHER): Payer: Medicare HMO

## 2020-03-04 VITALS — BP 110/72 | Ht 70.0 in | Wt 148.0 lb

## 2020-03-04 DIAGNOSIS — Z0000 Encounter for general adult medical examination without abnormal findings: Secondary | ICD-10-CM

## 2020-03-04 NOTE — Progress Notes (Signed)
Subjective:   Marc [name] is a 64 y.o. male who presents for Medicare Annual/Subsequent preventive examination.  Review of Systems     Cardiac Risk Factors include: advanced age (>49men, >63 women);hypertension;smoking/ tobacco exposure     Objective:    Today's Vitals   03/04/20 1545 03/04/20 1546  BP: 110/72   Weight: 148 lb (67.1 kg)   Height: 5\' 10"  (1.778 m)   PainSc:  5    Body mass index is 21.24 kg/m.  Advanced Directives 03/04/2020 12/01/2019 02/22/2019 12/20/2018 12/01/2018 05/05/2017 03/31/2017  Does Patient Have a Medical Advance Directive? No No No No No No No  Would patient like information on creating a medical advance directive? Yes (ED - Information included in AVS) - Yes (MAU/Ambulatory/Procedural Areas - Information given) - - Yes (MAU/Ambulatory/Procedural Areas - Information given) No - Patient declined  Pre-existing out of facility DNR order (yellow form or pink MOST form) - - - - - - -    Current Medications (verified) Outpatient Encounter Medications as of 03/04/2020  Medication Sig  . acetaminophen (TYLENOL) 500 MG tablet Take 500 mg by mouth as needed (pain.).   Marland Kitchen Ascorbic Acid (VITAMIN C WITH ROSE HIPS) 500 MG tablet Take 500 mg by mouth daily.  Marland Kitchen aspirin EC 81 MG tablet Take 81 mg by mouth daily.  . Cholecalciferol (VITAMIN D3 PO) Take 4 tablets by mouth daily. Takes 4 tablets of Vitamin D3 (1000) daily.  . cloNIDine (CATAPRES) 0.1 MG tablet Take 0.1 mg by mouth 3 (three) times daily.  Marland Kitchen dimenhyDRINATE (DRAMAMINE) 50 MG tablet Take 50 mg by mouth as needed for nausea (sour stomach).  (Patient not taking: Reported on 02/29/2020)  . Ferrous Gluconate-C-Folic Acid (IRON-C PO) Take by mouth daily.  . fluticasone (FLONASE) 50 MCG/ACT nasal spray Place 2 sprays into both nostrils daily.  . hydrocortisone (ANUSOL-HC) 2.5 % rectal cream Place 1 application rectally 2 (two) times daily. (Patient taking differently: Place 1 application rectally as needed. )  .  hydrocortisone (ANUSOL-HC) 25 MG suppository Place 1 suppository (25 mg total) rectally every 12 (twelve) hours.  Marland Kitchen ibuprofen (ADVIL) 200 MG tablet Take 400 mg by mouth 3 (three) times daily as needed (pain.).  Marland Kitchen linaclotide (LINZESS) 290 MCG CAPS capsule Take 1 capsule (290 mcg total) by mouth daily before breakfast.  . lisinopril (ZESTRIL) 10 MG tablet TAKE ONE TABLET BY MOUTH ONCE DAILY.  Marland Kitchen lubiprostone (AMITIZA) 24 MCG capsule Take 1 capsule (24 mcg total) by mouth 2 (two) times daily with a meal.  . mirtazapine (REMERON) 15 MG tablet Take 1 tablet (15 mg total) by mouth at bedtime.  . Multiple Vitamins-Minerals (MULTIVITAMIN ADULTS 50+) TABS Take 1 tablet by mouth daily.  . Nutritional Supplements (KETO PO) Take by mouth daily.  . Omega-3 Fatty Acids (FISH OIL) 1000 MG CAPS Take 1,000 mg by mouth daily.   Marland Kitchen omeprazole (PRILOSEC) 20 MG capsule Take 20 mg by mouth daily.  . prazosin (MINIPRESS) 5 MG capsule TAKE (1) CAPSULE BY MOUTH AT BEDTIME.  . QUEtiapine (SEROQUEL) 50 MG tablet Take 1 tablet (50 mg total) by mouth 4 (four) times daily.  Marland Kitchen senna (SENOKOT) 8.6 MG tablet Take 2 tablets by mouth 3 (three) times daily as needed for constipation.  . Turmeric 400 MG CAPS Take 400 mg by mouth daily.    No facility-administered encounter medications on file as of 03/04/2020.    Allergies (verified) Levofloxacin, Ciprofloxacin, and Sulfa antibiotics   History: Past Medical History:  Diagnosis Date  . Acute encephalopathy 12/29/2017   Hospitalized 5/22 to 5/23 with acute encephalopathy, unclear etiology  . Acute gout involving toe of right foot 09/14/2019  . Allergy   . Anxiety   . Arthritis   . Back problem   . Chest wall pain 03/28/2017  . Chronic hepatitis C without hepatic coma ([place]) 01/04/2009   Qualifier: Diagnosis of  By: Westly Pam. 2008: CT A/P NO CIRRHOSIS AUG 2013 MRCP- DILATED CBD/NO CIRRHOSIS DEC 2013 AST 99-102  ALT 145-156 PLT 147 HB 13.4 ALB 4.4-4.7 T BIL 0.7   .  Depression   . Dilation of biliary tract 07/14/2012   EUS JAN 2014 BENIGN CBD DILATION   . Epigastric pain 07/14/2012  . GERD (gastroesophageal reflux disease)   . Gout   . HCV (hepatitis C virus) 06/16/2015  . Hepatitis C 1979  . Hepatitis C reactive    LIVER BX 2008-CHRONIC ACTIVE HEPATITIS  . HTN (hypertension)   . Hx of adenomatous colonic polyps 2008   due for surveillance 2017  . Hypercholesterolemia   . IBS (irritable bowel syndrome)   . Irritable bowel syndrome 01/04/2009   Qualifier: Diagnosis of  By: Westly Pam.   . Knee pain   . Left forearm pain 08/15/2017  . Leukopenia 11/06/2015  . Narcotic addiction [facility]) 06/10/2011   [facility]- Oct 2012   . Normal cardiac stress test 05/25/2011   [facility] [place]  . Normal echocardiogram 05/25/2011   EF 55%, mild TR  . RUQ pain 07/14/2012  . Substance abuse ([place])    narcotic addiction  . Thrombocytopenia ([place]) 11/06/2015  . Trigger middle finger of left hand 06/20/2012   Past Surgical History:  Procedure Laterality Date  . APPENDECTOMY    . CARPAL TUNNEL RELEASE     rt  . COLONOSCOPY  2008 [facility] ARS D100 V8 PHEN 12.5   2 SIMPLE ADENOMAS (< 1 CM)  . COLONOSCOPY WITH PROPOFOL N/A 12/05/2019   TI normal, 3 mm polyp in ascending colon, external and internal hemorrhoids. Polyp removed but not retrieved. Poor prep  . ESOPHAGOGASTRODUODENOSCOPY  04/26/2012   Dr. Oneida Alar: Non-erosive gastritis (inflammation) was found in the gastric antrum but no H.pylori; multiple biopsies (duodenal bx negative for Celiac)/ The mucosa of the esophagus appeared normal  . ESOPHAGOGASTRODUODENOSCOPY (EGD) WITH PROPOFOL N/A 12/05/2019   mild gastritis, duodenitis due to aspirin/NSAIDs.   . EUS  09/08/2012   Dr. Ardis Hughs: CBD dilated but no stones. Query secondary to Sphincter of Oddi stenosis, ?dysfunction, but clinically without symptoms  . HARDWARE REMOVAL Right 02/12/2014   Procedure: HARDWARE REMOVAL;  Surgeon: Jolyn Nap, MD;  Location: [facility];  Service: Orthopedics;  Laterality: Right;  . KNEE ARTHROSCOPY    . Neurostimulator implant    . POLYPECTOMY  12/05/2019   Procedure: POLYPECTOMY;  Surgeon: Danie Binder, MD;  [facility];  Service: Endoscopy;;  . right ring finger    . spinal stenosis, had screws put in neck  04/11/2009   DR KRITZER  . TONSILLECTOMY    . ULNA OSTEOTOMY Right 02/12/2014   Procedure: RIGHT ULNAR SHORTENING AND OSTEOTOMY ;  Surgeon: Jolyn Nap, MD;  Location: [facility];  Service: Orthopedics;  Laterality: Right;  . ULNAR NERVE TRANSPOSITION    . UPPER GASTROINTESTINAL ENDOSCOPY  2008 [facility] ABD PAIN WEIGHT LOSS d100 v8 phen 12.5   NL  . WRIST SURGERY Right jan  2015   Dr. Edmonia Lynch   Family History  Problem Relation Age of Onset  . Bladder Cancer Mother        rare form   . Cancer Sister        Metastatic abdominal  . Emotional abuse Sister   . Stroke Sister   . Emotional abuse Sister   . Heart failure Father   . Stroke Father   . Alcohol abuse Father   . Dementia Paternal Aunt   . Alcohol abuse Paternal Uncle   . Dementia Paternal Uncle   . Dementia Paternal Grandmother   . Stroke Paternal Grandmother   . ADD / ADHD Cousin   . Bipolar disorder Cousin   . Anxiety disorder Cousin   . Depression Cousin        Committed suicide  . Alcohol abuse Cousin   . OCD Cousin   . Paranoid behavior Cousin   . Brain cancer Maternal Grandfather   . Heart defect Other        FAMILY HX  . Colon cancer Maternal Uncle   . Colon polyps Neg Hx   . Drug abuse Neg Hx   . Schizophrenia Neg Hx   . Seizures Neg Hx   . Sexual abuse Neg Hx   . Physical abuse Neg Hx    Social History   Socioeconomic History  . Marital status: Divorced    Spouse name: Not on file  . Number of children: Not on file  . Years of education: Not on file  . Highest education level: Not on file  Occupational History  . Not on file  Tobacco Use   . Smoking status: Former Smoker    Packs/day: 1.50    Years: 8.00    Pack years: 12.00    Types: Cigarettes    Quit date: 08/13/1986    Years since quitting: 33.5  . Smokeless tobacco: Never Used  . Tobacco comment: quit 20 + yrs ago  Vaping Use  . Vaping Use: Never used  Substance and Sexual Activity  . Alcohol use: No    Alcohol/week: 0.0 standard drinks  . Drug use: No  . Sexual activity: Never  Other Topics Concern  . Not on file  Social History Narrative  . Not on file   Social Determinants of Health   Financial Resource Strain: Low Risk   . Difficulty of Paying Living Expenses: Not hard at all  Food Insecurity: No Food Insecurity  . Worried About Charity fundraiser in the Last Year: Never true  . Ran Out of Food in the Last Year: Never true  Transportation Needs: No Transportation Needs  . Lack of Transportation (Medical): No  . Lack of Transportation (Non-Medical): No  Physical Activity: Sufficiently Active  . Days of Exercise per Week: 4 days  . Minutes of Exercise per Session: 40 min  Stress: No Stress Concern Present  . Feeling of Stress : Only a little  Social Connections: Moderately Integrated  . Frequency of Communication with Friends and Family: More than three times a week  . Frequency of Social Gatherings with Friends and Family: More than three times a week  . Attends Religious Services: More than 4 times per year  . Active Member of Clubs or Organizations: Yes  . Attends Archivist Meetings: More than 4 times per year  . Marital Status: Divorced    Tobacco Counseling Counseling given: Not Answered Comment: quit 20 + yrs ago   Clinical Intake:  Pre-visit preparation completed: No  Pain : 0-10 Pain Score: 5  Pain Type: Chronic pain     Nutritional Status: BMI of 19-24  Normal Diabetes: No  How often do you need to have someone help you when you read instructions, pamphlets, or other written materials from your doctor or  pharmacy?: 2 - Rarely What is the last grade level you completed in school?: 12th grade  Diabetic? No   Interpreter Needed?: No  Information entered by :: Jerardo Costabile LPN   Activities of Daily Living In your present state of health, do you have any difficulty performing the following activities: 03/04/2020 12/01/2019  Hearing? N N  Vision? N N  Difficulty concentrating or making decisions? N N  Walking or climbing stairs? N N  Dressing or bathing? N N  Doing errands, shopping? N N  Preparing Food and eating ? N -  Using the Toilet? N -  In the past six months, have you accidently leaked urine? N -  Do you have problems with loss of bowel control? N -  Managing your Medications? N -  Managing your Finances? N -  Housekeeping or managing your Housekeeping? N -  Some recent data might be hidden    Patient Care Team: Fayrene Helper, MD as PCP - General (Family Medicine) Danie Binder, MD (Inactive) (Gastroenterology) Leta Baptist, MD as Consulting Physician (Otolaryngology) Drema Dallas, MD (Physical Medicine and Rehabilitation) Milly Jakob, MD as Consulting Physician (Orthopedic Surgery) Cloria Spring, MD as Consulting Physician [facility])  Indicate any recent Medical Services you may have received from other than [facility] providers in the past year (date may be approximate).     Assessment:   This is a routine wellness examination for [place].  Hearing/Vision screen No exam data present  Dietary issues and exercise activities discussed: Current Exercise Habits: Home exercise routine, Time (Minutes): 45, Frequency (Times/Week): 4, Weekly Exercise (Minutes/Week): 180, Intensity: Mild  Goals    . LDL CALC < 130      Depression Screen PHQ 2/9 Scores 03/04/2020 02/29/2020 10/12/2019 09/14/2019 08/15/2019 04/11/2019 02/28/2019  PHQ - 2 Score 0 1 1 0 1 3 4   PHQ- 9 Score - 2 7 - 10 14 20   Exception Documentation - - - - - - -  Not completed - - - - - - -     Fall Risk Fall Risk  03/04/2020 02/29/2020 11/30/2019 11/09/2019 10/12/2019  Falls in the past year? 0 0 0 1 0  Number falls in past yr: 0 0 0 0 0  Injury with Fall? 0 0 0 0 0  Risk for fall due to : - No Fall Risks - - -  Follow up - Falls evaluation completed - - -    Any stairs in or around the home? No  If so, are there any without handrails? Yes  Home free of loose throw rugs in walkways, pet beds, electrical cords, etc? Yes  Adequate lighting in your home to reduce risk of falls? Yes   ASSISTIVE DEVICES UTILIZED TO PREVENT FALLS:  Life alert? No  Use of a cane, walker or w/c? No  Grab bars in the bathroom? No  Shower chair or bench in shower? No  Elevated toilet seat or a handicapped toilet? No   TIMED UP AND GO:  Was the test performed? No .  Length of time to ambulate 10 feet:  sec.     Cognitive Function:     6CIT Screen  03/04/2020 02/08/2019 05/05/2017  What Year? 0 points 0 points 0 points  What month? 0 points 0 points 0 points  What time? 0 points 0 points 0 points  Count back from 20 0 points 0 points 0 points  Months in reverse 0 points 0 points 0 points  Repeat phrase 2 points 0 points 0 points  Total Score 2 0 0    Immunizations Immunization History  Administered Date(s) Administered  . Fluad Quad(high Dose 65+) 04/11/2019  . Influenza Split 06/20/2012  . Influenza,inj,Quad PF,6+ Mos 04/24/2013, 08/28/2014, 06/13/2015, 08/11/2017  . Moderna SARS-COVID-2 Vaccination 12/05/2019, 01/02/2020  . Pneumococcal Conjugate-13 02/28/2019  . Tdap 12/24/2011  . Zoster 10/04/2013    TDAP status: Up to date Flu Vaccine status: Up to date Pneumococcal vaccine status: Declined,  Education has been provided regarding the importance of this vaccine but patient still declined. Advised may receive this vaccine at local pharmacy or Health Dept. Aware to provide a copy of the vaccination record if obtained from local pharmacy or Health Dept. Verbalized acceptance and  understanding.   Covid-19 vaccine status: Completed vaccines  Qualifies for Shingles Vaccine? Yes   Zostavax completed Yes   Shingrix Completed?: No.    Education has been provided regarding the importance of this vaccine. Patient has been advised to call insurance company to determine out of pocket expense if they have not yet received this vaccine. Advised may also receive vaccine at local pharmacy or Health Dept. Verbalized acceptance and understanding.  Screening Tests Health Maintenance  Topic Date Due  . PNA vac Low Risk Adult (2 of 2 - PPSV23) 02/28/2020  . INFLUENZA VACCINE  03/10/2020  . TETANUS/TDAP  12/23/2021  . COLONOSCOPY  12/04/2029  . COVID-19 Vaccine  Completed  . Hepatitis C Screening  Completed    Health Maintenance  Health Maintenance Due  Topic Date Due  . PNA vac Low Risk Adult (2 of 2 - PPSV23) 02/28/2020    Colorectal cancer screening: Completed yes. Repeat every has to repeat in August due to poor prep years  Lung Cancer Screening: (Low Dose CT Chest recommended if Age 63-80 years, 30 pack-year currently smoking OR have quit w/in 15years.) does not qualify.   Lung Cancer Screening Referral: no  Additional Screening:  Hepatitis C Screening: does qualify; Completed yes  Vision Screening: Recommended annual ophthalmology exams for early detection of glaucoma and other disorders of the eye. Is the patient up to date with their annual eye exam?  Yes  Who is the provider or what is the name of the office in which the patient attends annual eye exams? [facility] eye dr  If pt is not established with a provider, would they like to be referred to a provider to establish care? No .   Dental Screening: Recommended annual dental exams for proper oral hygiene  Community Resource Referral / Chronic Care Management: CRR required this visit?  No   CCM required this visit?  No      Plan:     I have personally reviewed and noted the following in the patient's chart:    . Medical and social history . Use of alcohol, tobacco or illicit drugs  . Current medications and supplements . Functional ability and status . Nutritional status . Physical activity . Advanced directives . List of other physicians . Hospitalizations, surgeries, and ER visits in previous 12 months . Vitals . Screenings to include cognitive, depression, and falls . Referrals and appointments  In addition, I have  reviewed and discussed with patient certain preventive protocols, quality metrics, and best practice recommendations. A written personalized care plan for preventive services as well as general preventive health recommendations were provided to patient.     Kate Sable, LPN, LPN   [id number]   Nurse Notes: Visit performed by telephone. Patient at home. Provider in the office. Time spent with patient 25 mins

## 2020-03-04 NOTE — Patient Instructions (Signed)
Marc [name] , Thank you for taking time to come for your Medicare Wellness Visit. I appreciate your ongoing commitment to your health goals. Please review the following plan we discussed and let me know if I can assist you in the future.   Screening recommendations/referrals: Colonoscopy: will repeat later this year Recommended yearly ophthalmology/optometry visit for glaucoma screening and checkup Recommended yearly dental visit for hygiene and checkup  Vaccinations: Influenza vaccine: up to date  Pneumococcal vaccine: Pneumovax 23 due  Tdap vaccine: up to date  Shingles vaccine: information mailed- can get at pharmacy      Next appointment: wellness in 1 year   Preventive Care 66 Years and Older, Male Preventive care refers to lifestyle choices and visits with your health care provider that can promote health and wellness. What does preventive care include?  A yearly physical exam. This is also called an annual well check.  Dental exams once or twice a year.  Routine eye exams. Ask your health care provider how often you should have your eyes checked.  Personal lifestyle choices, including:  Daily care of your teeth and gums.  Regular physical activity.  Eating a healthy diet.  Avoiding tobacco and drug use.  Limiting alcohol use.  Practicing safe sex.  Taking low doses of aspirin every day.  Taking vitamin and mineral supplements as recommended by your health care provider. What happens during an annual well check? The services and screenings done by your health care provider during your annual well check will depend on your age, overall health, lifestyle risk factors, and family history of disease. Counseling  Your health care provider may ask you questions about your:  Alcohol use.  Tobacco use.  Drug use.  Emotional well-being.  Home and relationship well-being.  Sexual activity.  Eating habits.  History of falls.  Memory and ability to understand  (cognition).  Work and work Statistician. Screening  You may have the following tests or measurements:  Height, weight, and BMI.  Blood pressure.  Lipid and cholesterol levels. These may be checked every 5 years, or more frequently if you are over 12 years old.  Skin check.  Lung cancer screening. You may have this screening every year starting at age 49 if you have a 30-pack-year history of smoking and currently smoke or have quit within the past 15 years.  Fecal occult blood test (FOBT) of the stool. You may have this test every year starting at age 76.  Flexible sigmoidoscopy or colonoscopy. You may have a sigmoidoscopy every 5 years or a colonoscopy every 10 years starting at age 26.  Prostate cancer screening. Recommendations will vary depending on your family history and other risks.  Hepatitis C blood test.  Hepatitis B blood test.  Sexually transmitted disease (STD) testing.  Diabetes screening. This is done by checking your blood sugar (glucose) after you have not eaten for a while (fasting). You may have this done every 1-3 years.  Abdominal aortic aneurysm (AAA) screening. You may need this if you are a current or former smoker.  Osteoporosis. You may be screened starting at age 50 if you are at high risk. Talk with your health care provider about your test results, treatment options, and if necessary, the need for more tests. Vaccines  Your health care provider may recommend certain vaccines, such as:  Influenza vaccine. This is recommended every year.  Tetanus, diphtheria, and acellular pertussis (Tdap, Td) vaccine. You may need a Td booster every 10 years.  Zoster  vaccine. You may need this after age 35.  Pneumococcal 13-valent conjugate (PCV13) vaccine. One dose is recommended after age 51.  Pneumococcal polysaccharide (PPSV23) vaccine. One dose is recommended after age 16. Talk to your health care provider about which screenings and vaccines you need and  how often you need them. This information is not intended to replace advice given to you by your health care provider. Make sure you discuss any questions you have with your health care provider. Document Released: 08/23/2015 Document Revised: 04/15/2016 Document Reviewed: 05/28/2015 Elsevier Interactive Patient Education  2017 [employer] Prevention in the Home Falls can cause injuries. They can happen to people of all ages. There are many things you can do to make your home safe and to help prevent falls. What can I do on the outside of my home?  Regularly fix the edges of walkways and driveways and fix any cracks.  Remove anything that might make you trip as you walk through a door, such as a raised step or threshold.  Trim any bushes or trees on the path to your home.  Use bright outdoor lighting.  Clear any walking paths of anything that might make someone trip, such as rocks or tools.  Regularly check to see if handrails are loose or broken. Make sure that both sides of any steps have handrails.  Any raised decks and porches should have guardrails on the edges.  Have any leaves, snow, or ice cleared regularly.  Use sand or salt on walking paths during winter.  Clean up any spills in your garage right away. This includes oil or grease spills. What can I do in the bathroom?  Use night lights.  Install grab bars by the toilet and in the tub and shower. Do not use towel bars as grab bars.  Use non-skid mats or decals in the tub or shower.  If you need to sit down in the shower, use a plastic, non-slip stool.  Keep the floor dry. Clean up any water that spills on the floor as soon as it happens.  Remove soap buildup in the tub or shower regularly.  Attach bath mats securely with double-sided non-slip rug tape.  Do not have throw rugs and other things on the floor that can make you trip. What can I do in the bedroom?  Use night lights.  Make sure that you  have a light by your bed that is easy to reach.  Do not use any sheets or blankets that are too big for your bed. They should not hang down onto the floor.  Have a firm chair that has side arms. You can use this for support while you get dressed.  Do not have throw rugs and other things on the floor that can make you trip. What can I do in the kitchen?  Clean up any spills right away.  Avoid walking on wet floors.  Keep items that you use a lot in easy-to-reach places.  If you need to reach something above you, use a strong step stool that has a grab bar.  Keep electrical cords out of the way.  Do not use floor polish or wax that makes floors slippery. If you must use wax, use non-skid floor wax.  Do not have throw rugs and other things on the floor that can make you trip. What can I do with my stairs?  Do not leave any items on the stairs.  Make sure that there are handrails  on both sides of the stairs and use them. Fix handrails that are broken or loose. Make sure that handrails are as long as the stairways.  Check any carpeting to make sure that it is firmly attached to the stairs. Fix any carpet that is loose or worn.  Avoid having throw rugs at the top or bottom of the stairs. If you do have throw rugs, attach them to the floor with carpet tape.  Make sure that you have a light switch at the top of the stairs and the bottom of the stairs. If you do not have them, ask someone to add them for you. What else can I do to help prevent falls?  Wear shoes that:  Do not have high heels.  Have rubber bottoms.  Are comfortable and fit you well.  Are closed at the toe. Do not wear sandals.  If you use a stepladder:  Make sure that it is fully opened. Do not climb a closed stepladder.  Make sure that both sides of the stepladder are locked into place.  Ask someone to hold it for you, if possible.  Clearly mark and make sure that you can see:  Any grab bars or  handrails.  First and last steps.  Where the edge of each step is.  Use tools that help you move around (mobility aids) if they are needed. These include:  Canes.  Walkers.  Scooters.  Crutches.  Turn on the lights when you go into a dark area. Replace any light bulbs as soon as they burn out.  Set up your furniture so you have a clear path. Avoid moving your furniture around.  If any of your floors are uneven, fix them.  If there are any pets around you, be aware of where they are.  Review your medicines with your doctor. Some medicines can make you feel dizzy. This can increase your chance of falling. Ask your doctor what other things that you can do to help prevent falls. This information is not intended to replace advice given to you by your health care provider. Make sure you discuss any questions you have with your health care provider. Document Released: 05/23/2009 Document Revised: 01/02/2016 Document Reviewed: 08/31/2014 Elsevier Interactive Patient Education  2017 [employer].

## 2020-03-06 ENCOUNTER — Other Ambulatory Visit: Payer: Self-pay

## 2020-03-06 ENCOUNTER — Encounter (HOSPITAL_COMMUNITY)
Admission: RE | Admit: 2020-03-06 | Discharge: 2020-03-06 | Disposition: A | Discharge status: Home / Self Care | Payer: Medicare HMO | Source: Ambulatory Visit | Attending: Internal Medicine | Admitting: Internal Medicine

## 2020-03-08 ENCOUNTER — Other Ambulatory Visit: Payer: Self-pay | Admitting: Family Medicine

## 2020-03-08 DIAGNOSIS — I10 Essential (primary) hypertension: Secondary | ICD-10-CM

## 2020-03-11 ENCOUNTER — Telehealth: Payer: Self-pay

## 2020-03-11 NOTE — Telephone Encounter (Signed)
This medication was sent to the pharmacy

## 2020-03-11 NOTE — Telephone Encounter (Signed)
PT is calling to get a refill on lisinapril

## 2020-03-18 ENCOUNTER — Telehealth: Payer: Self-pay | Admitting: Internal Medicine

## 2020-03-18 NOTE — Telephone Encounter (Signed)
Pt wants to reschedule his procedure with Dr Abbey Chatters on 03/22/2020 to another day. [phone]

## 2020-03-18 NOTE — Telephone Encounter (Signed)
Called pt. He has been rescheduled to 9/27 at 12:15pm. Patient aware will mail new prep instructions and new covid test appt. Confirmed mailing address. Endo aware of appt change.

## 2020-03-20 ENCOUNTER — Other Ambulatory Visit (HOSPITAL_COMMUNITY): Payer: Medicare HMO

## 2020-04-08 DIAGNOSIS — L438 Other lichen planus: Secondary | ICD-10-CM | POA: Diagnosis not present

## 2020-04-09 ENCOUNTER — Other Ambulatory Visit: Payer: Self-pay | Admitting: Family Medicine

## 2020-04-09 DIAGNOSIS — I10 Essential (primary) hypertension: Secondary | ICD-10-CM

## 2020-04-17 ENCOUNTER — Encounter (INDEPENDENT_AMBULATORY_CARE_PROVIDER_SITE_OTHER): Payer: Self-pay

## 2020-04-17 NOTE — Progress Notes (Unsigned)
Faxed in Rx refill into [facility] in [place] for Omeprazole 40mg . Take 1 capsule by mouth once a day before dinner. @ refills.

## 2020-04-18 ENCOUNTER — Telehealth: Payer: Self-pay | Admitting: *Deleted

## 2020-04-18 NOTE — Telephone Encounter (Signed)
Called pt. He has been moved to 9/28 at 7:30am. Pt aware will mail new instructions with new pre-op/covid test appt. Called endo and made awre

## 2020-04-18 NOTE — Telephone Encounter (Signed)
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   He needs to be changed to Endo 3   Thanks,  Rosalyn Gess RN

## 2020-04-23 ENCOUNTER — Encounter (HOSPITAL_COMMUNITY): Admission: RE | Admit: 2020-04-23 | Payer: Medicare HMO | Source: Ambulatory Visit

## 2020-04-29 ENCOUNTER — Telehealth: Payer: Self-pay | Admitting: Internal Medicine

## 2020-04-29 NOTE — Telephone Encounter (Signed)
Called pt. He has already called/cancelled prior. Patient scheduled to come back in for another OV. Message sent to [facility] in endo making aware.

## 2020-04-29 NOTE — Telephone Encounter (Signed)
Patient called and needs to reschedule his procedure  °

## 2020-05-03 ENCOUNTER — Other Ambulatory Visit (HOSPITAL_COMMUNITY): Payer: Medicare HMO

## 2020-05-03 ENCOUNTER — Encounter (HOSPITAL_COMMUNITY): Admission: RE | Admit: 2020-05-03 | Payer: Medicare HMO | Source: Ambulatory Visit

## 2020-05-07 ENCOUNTER — Ambulatory Visit (HOSPITAL_COMMUNITY): Admission: RE | Admit: 2020-05-07 | Payer: Medicare HMO | Source: Home / Self Care

## 2020-05-07 ENCOUNTER — Encounter (HOSPITAL_COMMUNITY): Admission: RE | Payer: Self-pay | Source: Home / Self Care

## 2020-05-07 SURGERY — COLONOSCOPY WITH PROPOFOL
Anesthesia: Monitor Anesthesia Care

## 2020-05-08 NOTE — Progress Notes (Signed)
Referring Provider: Fayrene Helper, MD Primary Care Physician:  Fayrene Helper, MD Primary GI Physician: Dr. Abbey Chatters  Chief Complaint  Patient presents with  . Gastroesophageal Reflux  . Dysphagia    trouble swallowing foods, reports foods get stuck at times    HPI:   Marc [name] is a 64 y.o. male presenting today with chief complaint of GERD and dysphagia.  He is also needing to reschedule his colonoscopy.    History of GERD, constipation, colon polyps, hemorrhoids, and Hep C genotype 1a, elastography F3/F4 s/p treatment in past with [facility] and documented SVR. Post treatment elastography with kPa 6.2 in February 2021.  EGD in April 2021 with mild gastritis/duodenitis.  No varices.  Per Dr. Oneida Alar, no further ultrasound surveillance needed.  Colonoscopy with poor prep in April 2021, polyp removed but not retrieved.  Recommended soft diet for 7 days prior and full liquids 2 days prior with miralax prep, and clear liquids with trilyte prep 1 day prior.  Last seen in our office 01/05/2020.  Had previously been provided samples of Linzess 290 mcg but could not remember how this worked for him.  Felt bloated and constipated.  Hemorrhoids were itching but without bleeding.  Feels full but always hungry.  Nausea in the mornings without vomiting.  Plan to trial Linzess 290 mcg again and requested progress report, proceed with colonoscopy, may be a candidate for hemorrhoid banding with plans for follow-up after colonoscopy, continue Prilosec, consider GES if bloating/early satiety does not improve with constipation.  Telephone call 01/09/2020 reporting Linzess is not working well.  Prescription for Amitiza 24 mcg twice daily was sent to pharmacy.  Telephone call 01/11/2020 with patient stating Linzess was actually working and would like a prescription.  Telephone call 03/18/2020 requesting to reschedule colonoscopy.  He was rescheduled for 9/28.   Patient called 9/20 requesting to  reschedule appointment.  He was advised he would need an office visit as he had already rescheduled before.  Today:  GERD: Feels something is always coming up his esophagus into his throat that he is having to swallow. Quit taking omeprazole about 1 month ago and just restarted it about 4 days ago. He was having symptoms even when taking omeprazole. While off omeprazole symptoms were worse. Never tried Protonix.   Feels foods are getting hung in his esophagus. Will bring the food back up. This started in the last month. Occurring daily. Usually with solid foods and breads.  He is chewing well.   Mild nausea in the morning. No vomiting. Trying to sleep elevated with pillow. No soda. Limiting fried/fatty foods. No alcohol. Has been drinking a lot of coffee.   Upper abdominal bloating that can be uncomfortable. If able to have a BM, this helps. BMs every couple of days. Taking Linzess 290 mcg daily and 3 stool softeners 3 times daily. Occasional hard stools. No blood in the stool. No black stools. Stools can be dark. Just started a probiotic.    Taking ibuprofen: 400 mg TID for joint pain. Also taking baby aspirin.    Reports his dentist treated him for lichen planus with a steroid. Had been having trouble with his gums feeling like there was a flare of slime over them.  This has improved.    No longer drinking alcohol.  Used to drink heavily.  Went to treatment in May 2020.  Currently in [place].   Wants to hold off on colonoscopy.   Past Medical History:  Diagnosis  Date  . Acute encephalopathy 12/29/2017   Hospitalized 5/22 to 5/23 with acute encephalopathy, unclear etiology  . Acute gout involving toe of right foot 09/14/2019  . Allergy   . Anxiety   . Arthritis   . Back problem   . Chest wall pain 03/28/2017  . Chronic hepatitis C without hepatic coma ([name]) 01/04/2009   Qualifier: Diagnosis of  By: Westly Pam. 2008: CT A/P NO CIRRHOSIS AUG 2013 MRCP- DILATED CBD/NO CIRRHOSIS DEC 2013  AST 99-102  ALT 145-156 PLT 147 HB 13.4 ALB 4.4-4.7 T BIL 0.7   . Depression   . Dilation of biliary tract 07/14/2012   EUS JAN 2014 BENIGN CBD DILATION   . Epigastric pain 07/14/2012  . GERD (gastroesophageal reflux disease)   . Gout   . HCV (hepatitis C virus) 06/16/2015  . Hepatitis C 1979  . Hepatitis C reactive    LIVER BX 2008-CHRONIC ACTIVE HEPATITIS  . HTN (hypertension)   . Hx of adenomatous colonic polyps 2008   due for surveillance 2017  . Hypercholesterolemia   . IBS (irritable bowel syndrome)   . Irritable bowel syndrome 01/04/2009   Qualifier: Diagnosis of  By: Westly Pam.   . Knee pain   . Left forearm pain 08/15/2017  . Leukopenia 11/06/2015  . Narcotic addiction [facility]) 06/10/2011   [facility]- Oct 2012   . Normal cardiac stress test 05/25/2011   [facility]  . Normal echocardiogram 05/25/2011   EF 55%, mild TR  . RUQ pain 07/14/2012  . Substance abuse ([place])    narcotic addiction  . Thrombocytopenia ([place]) 11/06/2015  . Trigger middle finger of left hand 06/20/2012    Past Surgical History:  Procedure Laterality Date  . APPENDECTOMY    . CARPAL TUNNEL RELEASE     rt  . COLONOSCOPY  2008 [facility] ARS D100 V8 PHEN 12.5   2 SIMPLE ADENOMAS (< 1 CM)  . COLONOSCOPY WITH PROPOFOL N/A 12/05/2019   TI normal, 3 mm polyp in ascending colon, external and internal hemorrhoids. Polyp removed but not retrieved. Poor prep  . ESOPHAGOGASTRODUODENOSCOPY  04/26/2012   Dr. Oneida Alar: Non-erosive gastritis (inflammation) was found in the gastric antrum but no H.pylori; multiple biopsies (duodenal bx negative for Celiac)/ The mucosa of the esophagus appeared normal  . ESOPHAGOGASTRODUODENOSCOPY (EGD) WITH PROPOFOL N/A 12/05/2019   mild gastritis, duodenitis due to aspirin/NSAIDs.   . EUS  09/08/2012   Dr. Ardis Hughs: CBD dilated but no stones. Query secondary to Sphincter of Oddi stenosis, ?dysfunction, but clinically without symptoms  . HARDWARE  REMOVAL Right 02/12/2014   Procedure: HARDWARE REMOVAL;  Surgeon: Jolyn Nap, MD;  Location: [facility];  Service: Orthopedics;  Laterality: Right;  . KNEE ARTHROSCOPY    . Neurostimulator implant    . POLYPECTOMY  12/05/2019   Procedure: POLYPECTOMY;  Surgeon: Danie Binder, MD;  [facility];  Service: Endoscopy;;  . right ring finger    . spinal stenosis, had screws put in neck  04/11/2009   DR KRITZER  . TONSILLECTOMY    . ULNA OSTEOTOMY Right 02/12/2014   Procedure: RIGHT ULNAR SHORTENING AND OSTEOTOMY ;  Surgeon: Jolyn Nap, MD;  Location: [facility];  Service: Orthopedics;  Laterality: Right;  . ULNAR NERVE TRANSPOSITION    . UPPER GASTROINTESTINAL ENDOSCOPY  2008 [facility] ABD PAIN WEIGHT LOSS d100 v8 phen 12.5   NL  . WRIST SURGERY Right jan  2015   Dr. Edmonia Lynch    Current Outpatient Medications  Medication Sig Dispense Refill  . acetaminophen (TYLENOL) 500 MG tablet Take 500 mg by mouth as needed (pain.).     Marland Kitchen aspirin EC 81 MG tablet Take 81 mg by mouth daily.    . cloNIDine (CATAPRES) 0.1 MG tablet Take 0.1 mg by mouth 2 (two) times daily.     . Ferrous Gluconate-C-Folic Acid (IRON-C PO) Take by mouth daily.     Marland Kitchen ibuprofen (ADVIL) 200 MG tablet Take 400 mg by mouth 3 (three) times daily as needed (pain.).    Marland Kitchen Lactobacillus Rhamnosus, GG, (CULTURELLE PO) Take by mouth daily.    Marland Kitchen linaclotide (LINZESS) 290 MCG CAPS capsule Take 1 capsule (290 mcg total) by mouth daily before breakfast. 90 capsule 3  . lisinopril (ZESTRIL) 10 MG tablet TAKE ONE TABLET BY MOUTH ONCE DAILY. 30 tablet 0  . mirtazapine (REMERON) 15 MG tablet Take 1 tablet (15 mg total) by mouth at bedtime. 30 tablet 2  . Nutritional Supplements (KETO PO) Take by mouth daily.     . QUEtiapine (SEROQUEL) 50 MG tablet Take 1 tablet (50 mg total) by mouth 4 (four) times daily. 120 tablet 2  . senna (SENOKOT) 8.6 MG tablet Take 2 tablets by mouth 3 (three) times daily  as needed for constipation.    . Ascorbic Acid (VITAMIN C WITH ROSE HIPS) 500 MG tablet Take 500 mg by mouth daily. (Patient not taking: Reported on 05/09/2020)    . Cholecalciferol (VITAMIN D3 PO) Take 4 tablets by mouth daily. Takes 4 tablets of Vitamin D3 (1000) daily. (Patient not taking: Reported on 05/09/2020)    . fluticasone (FLONASE) 50 MCG/ACT nasal spray Place 2 sprays into both nostrils daily. (Patient not taking: Reported on 05/09/2020) 16 g 1  . hydrocortisone (ANUSOL-HC) 2.5 % rectal cream Place 1 application rectally 2 (two) times daily. (Patient not taking: Reported on 05/09/2020) 30 g 1  . hydrocortisone (ANUSOL-HC) 25 MG suppository Place 1 suppository (25 mg total) rectally every 12 (twelve) hours. (Patient not taking: Reported on 05/09/2020) 12 suppository 1  . lubiprostone (AMITIZA) 24 MCG capsule Take 1 capsule (24 mcg total) by mouth 2 (two) times daily with a meal. 60 capsule 3  . Multiple Vitamins-Minerals (MULTIVITAMIN ADULTS 50+) TABS Take 1 tablet by mouth daily. (Patient not taking: Reported on 05/09/2020)    . Omega-3 Fatty Acids (FISH OIL) 1000 MG CAPS Take 1,000 mg by mouth daily.  (Patient not taking: Reported on 05/09/2020)    . pantoprazole (PROTONIX) 40 MG tablet Take 1 tablet (40 mg total) by mouth daily before breakfast. 30 tablet 5  . Turmeric 400 MG CAPS Take 400 mg by mouth daily.  (Patient not taking: Reported on 05/09/2020)     No current facility-administered medications for this visit.    Allergies as of 05/09/2020 - Review Complete 05/09/2020  Allergen Reaction Noted  . Levofloxacin Nausea Only and Other (See Comments)   . Ciprofloxacin  06/13/2015  . Sulfa antibiotics  12/02/2012    Family History  Problem Relation Age of Onset  . Bladder Cancer Mother        rare form   . Cancer Sister        Metastatic abdominal  . Emotional abuse Sister   . Stroke Sister   . Emotional abuse Sister   . Heart failure Father   . Stroke Father   . Alcohol abuse  Father   . Dementia  Paternal Aunt   . Alcohol abuse Paternal Uncle   . Dementia Paternal Uncle   . Dementia Paternal Grandmother   . Stroke Paternal Grandmother   . ADD / ADHD Cousin   . Bipolar disorder Cousin   . Anxiety disorder Cousin   . Depression Cousin        Committed suicide  . Alcohol abuse Cousin   . OCD Cousin   . Paranoid behavior Cousin   . Brain cancer Maternal Grandfather   . Heart defect Other        FAMILY HX  . Colon cancer Maternal Uncle   . Colon cancer Cousin   . Colon polyps Neg Hx   . Drug abuse Neg Hx   . Schizophrenia Neg Hx   . Seizures Neg Hx   . Sexual abuse Neg Hx   . Physical abuse Neg Hx     Social History   Socioeconomic History  . Marital status: Divorced    Spouse name: Not on file  . Number of children: Not on file  . Years of education: Not on file  . Highest education level: Not on file  Occupational History  . Not on file  Tobacco Use  . Smoking status: Former Smoker    Packs/day: 1.50    Years: 8.00    Pack years: 12.00    Types: Cigarettes    Quit date: 08/13/1986    Years since quitting: 33.7  . Smokeless tobacco: Never Used  . Tobacco comment: quit 20 + yrs ago  Vaping Use  . Vaping Use: Never used  Substance and Sexual Activity  . Alcohol use: Not Currently    Alcohol/week: 0.0 standard drinks    Comment: Used to drink heavily- went to treatment in May 2020.   Marland Kitchen Drug use: Not Currently    Comment: pain medications, klonopin- none in the last 17 months. Currently in [place]  . Sexual activity: Never  Other Topics Concern  . Not on file  Social History Narrative  . Not on file   Social Determinants of Health   Financial Resource Strain: Low Risk   . Difficulty of Paying Living Expenses: Not hard at all  Food Insecurity: No Food Insecurity  . Worried About Charity fundraiser in the Last Year: Never true  . Ran Out of Food in the Last Year: Never true  Transportation Needs: No Transportation Needs  . Lack of  Transportation (Medical): No  . Lack of Transportation (Non-Medical): No  Physical Activity: Sufficiently Active  . Days of Exercise per Week: 4 days  . Minutes of Exercise per Session: 40 min  Stress: No Stress Concern Present  . Feeling of Stress : Only a little  Social Connections: Moderately Integrated  . Frequency of Communication with Friends and Family: More than three times a week  . Frequency of Social Gatherings with Friends and Family: More than three times a week  . Attends Religious Services: More than 4 times per year  . Active Member of Clubs or Organizations: Yes  . Attends Archivist Meetings: More than 4 times per year  . Marital Status: Divorced    Review of Systems: Gen: Denies fever, chills, cold or flu like symptoms, pre-syncope, or syncope.  CV: Denies chest pain. Resp: Denies dyspnea or cough.  GI: See HPI  Heme: See HPI.  Physical Exam: BP (!) 146/85   Pulse 72   Temp (!) 97.1 F (36.2 C) (Oral)   Ht 5\' 10"  (  1.778 m)   Wt 145 lb 6.4 oz (66 kg)   BMI 20.86 kg/m  General:   Alert and oriented. No distress noted. Pleasant and cooperative.  Head:  Normocephalic and atraumatic. Eyes:  Conjuctiva clear without scleral icterus. Heart:  S1, S2 present without murmurs appreciated. Lungs:  Clear to auscultation bilaterally. No wheezes, rales, or rhonchi. No distress.  Abdomen:  +BS, soft, and non-distended. Mild TTP in epigastric area. No rebound or guarding. No HSM or masses noted. Msk:  Symmetrical without gross deformities. Normal posture. Extremities:  Without edema. Neurologic:  Alert and  oriented x4 Psych:  Normal mood and affect.

## 2020-05-08 NOTE — H&P (View-Only) (Signed)
Referring Provider: Fayrene Helper, MD Primary Care Physician:  Fayrene Helper, MD Primary GI Physician: Dr. Abbey Chatters  Chief Complaint  Patient presents with  . Gastroesophageal Reflux  . Dysphagia    trouble swallowing foods, reports foods get stuck at times    HPI:   Marc [name] is a 64 y.o. male presenting today with chief complaint of GERD and dysphagia.  He is also needing to reschedule his colonoscopy.    History of GERD, constipation, colon polyps, hemorrhoids, and Hep C genotype 1a, elastography F3/F4 s/p treatment in past with [facility] and documented SVR. Post treatment elastography with kPa 6.2 in February 2021.  EGD in April 2021 with mild gastritis/duodenitis.  No varices.  Per Dr. Oneida Alar, no further ultrasound surveillance needed.  Colonoscopy with poor prep in April 2021, polyp removed but not retrieved.  Recommended soft diet for 7 days prior and full liquids 2 days prior with miralax prep, and clear liquids with trilyte prep 1 day prior.  Last seen in our office 01/05/2020.  Had previously been provided samples of Linzess 290 mcg but could not remember how this worked for him.  Felt bloated and constipated.  Hemorrhoids were itching but without bleeding.  Feels full but always hungry.  Nausea in the mornings without vomiting.  Plan to trial Linzess 290 mcg again and requested progress report, proceed with colonoscopy, may be a candidate for hemorrhoid banding with plans for follow-up after colonoscopy, continue Prilosec, consider GES if bloating/early satiety does not improve with constipation.  Telephone call 01/09/2020 reporting Linzess is not working well.  Prescription for Amitiza 24 mcg twice daily was sent to pharmacy.  Telephone call 01/11/2020 with patient stating Linzess was actually working and would like a prescription.  Telephone call 03/18/2020 requesting to reschedule colonoscopy.  He was rescheduled for 9/28.   Patient called 9/20 requesting to  reschedule appointment.  He was advised he would need an office visit as he had already rescheduled before.  Today:  GERD: Feels something is always coming up his esophagus into his throat that he is having to swallow. Quit taking omeprazole about 1 month ago and just restarted it about 4 days ago. He was having symptoms even when taking omeprazole. While off omeprazole symptoms were worse. Never tried Protonix.   Feels foods are getting hung in his esophagus. Will bring the food back up. This started in the last month. Occurring daily. Usually with solid foods and breads.  He is chewing well.   Mild nausea in the morning. No vomiting. Trying to sleep elevated with pillow. No soda. Limiting fried/fatty foods. No alcohol. Has been drinking a lot of coffee.   Upper abdominal bloating that can be uncomfortable. If able to have a BM, this helps. BMs every couple of days. Taking Linzess 290 mcg daily and 3 stool softeners 3 times daily. Occasional hard stools. No blood in the stool. No black stools. Stools can be dark. Just started a probiotic.    Taking ibuprofen: 400 mg TID for joint pain. Also taking baby aspirin.    Reports his dentist treated him for lichen planus with a steroid. Had been having trouble with his gums feeling like there was a flare of slime over them.  This has improved.    No longer drinking alcohol.  Used to drink heavily.  Went to treatment in May 2020.  Currently in [place].   Wants to hold off on colonoscopy.   Past Medical History:  Diagnosis  Date  . Acute encephalopathy 12/29/2017   Hospitalized 5/22 to 5/23 with acute encephalopathy, unclear etiology  . Acute gout involving toe of right foot 09/14/2019  . Allergy   . Anxiety   . Arthritis   . Back problem   . Chest wall pain 03/28/2017  . Chronic hepatitis C without hepatic coma ([place]) 01/04/2009   Qualifier: Diagnosis of  By: Westly Pam. 2008: CT A/P NO CIRRHOSIS AUG 2013 MRCP- DILATED CBD/NO CIRRHOSIS DEC 2013  AST 99-102  ALT 145-156 PLT 147 HB 13.4 ALB 4.4-4.7 T BIL 0.7   . Depression   . Dilation of biliary tract 07/14/2012   EUS JAN 2014 BENIGN CBD DILATION   . Epigastric pain 07/14/2012  . GERD (gastroesophageal reflux disease)   . Gout   . HCV (hepatitis C virus) 06/16/2015  . Hepatitis C 1979  . Hepatitis C reactive    LIVER BX 2008-CHRONIC ACTIVE HEPATITIS  . HTN (hypertension)   . Hx of adenomatous colonic polyps 2008   due for surveillance 2017  . Hypercholesterolemia   . IBS (irritable bowel syndrome)   . Irritable bowel syndrome 01/04/2009   Qualifier: Diagnosis of  By: Westly Pam.   . Knee pain   . Left forearm pain 08/15/2017  . Leukopenia 11/06/2015  . Narcotic addiction [facility]) 06/10/2011   [facility]- Oct 2012   . Normal cardiac stress test 05/25/2011   [facility] [place]  . Normal echocardiogram 05/25/2011   EF 55%, mild TR  . RUQ pain 07/14/2012  . Substance abuse ([place])    narcotic addiction  . Thrombocytopenia ([place]) 11/06/2015  . Trigger middle finger of left hand 06/20/2012    Past Surgical History:  Procedure Laterality Date  . APPENDECTOMY    . CARPAL TUNNEL RELEASE     rt  . COLONOSCOPY  2008 [facility] ARS D100 V8 PHEN 12.5   2 SIMPLE ADENOMAS (< 1 CM)  . COLONOSCOPY WITH PROPOFOL N/A 12/05/2019   TI normal, 3 mm polyp in ascending colon, external and internal hemorrhoids. Polyp removed but not retrieved. Poor prep  . ESOPHAGOGASTRODUODENOSCOPY  04/26/2012   Dr. Oneida Alar: Non-erosive gastritis (inflammation) was found in the gastric antrum but no H.pylori; multiple biopsies (duodenal bx negative for Celiac)/ The mucosa of the esophagus appeared normal  . ESOPHAGOGASTRODUODENOSCOPY (EGD) WITH PROPOFOL N/A 12/05/2019   mild gastritis, duodenitis due to aspirin/NSAIDs.   . EUS  09/08/2012   Dr. Ardis Hughs: CBD dilated but no stones. Query secondary to Sphincter of Oddi stenosis, ?dysfunction, but clinically without symptoms  . HARDWARE  REMOVAL Right 02/12/2014   Procedure: HARDWARE REMOVAL;  Surgeon: Jolyn Nap, MD;  Location: [place];  Service: Orthopedics;  Laterality: Right;  . KNEE ARTHROSCOPY    . Neurostimulator implant    . POLYPECTOMY  12/05/2019   Procedure: POLYPECTOMY;  Surgeon: Danie Binder, MD;  [facility];  Service: Endoscopy;;  . right ring finger    . spinal stenosis, had screws put in neck  04/11/2009   DR KRITZER  . TONSILLECTOMY    . ULNA OSTEOTOMY Right 02/12/2014   Procedure: RIGHT ULNAR SHORTENING AND OSTEOTOMY ;  Surgeon: Jolyn Nap, MD;  Location: [place];  Service: Orthopedics;  Laterality: Right;  . ULNAR NERVE TRANSPOSITION    . UPPER GASTROINTESTINAL ENDOSCOPY  2008 [facility] ABD PAIN WEIGHT LOSS d100 v8 phen 12.5   NL  . WRIST SURGERY Right jan  2015   Dr. Edmonia Lynch    Current Outpatient Medications  Medication Sig Dispense Refill  . acetaminophen (TYLENOL) 500 MG tablet Take 500 mg by mouth as needed (pain.).     Marland Kitchen aspirin EC 81 MG tablet Take 81 mg by mouth daily.    . cloNIDine (CATAPRES) 0.1 MG tablet Take 0.1 mg by mouth 2 (two) times daily.     . Ferrous Gluconate-C-Folic Acid (IRON-C PO) Take by mouth daily.     Marland Kitchen ibuprofen (ADVIL) 200 MG tablet Take 400 mg by mouth 3 (three) times daily as needed (pain.).    Marland Kitchen Lactobacillus Rhamnosus, GG, (CULTURELLE PO) Take by mouth daily.    Marland Kitchen linaclotide (LINZESS) 290 MCG CAPS capsule Take 1 capsule (290 mcg total) by mouth daily before breakfast. 90 capsule 3  . lisinopril (ZESTRIL) 10 MG tablet TAKE ONE TABLET BY MOUTH ONCE DAILY. 30 tablet 0  . mirtazapine (REMERON) 15 MG tablet Take 1 tablet (15 mg total) by mouth at bedtime. 30 tablet 2  . Nutritional Supplements (KETO PO) Take by mouth daily.     . QUEtiapine (SEROQUEL) 50 MG tablet Take 1 tablet (50 mg total) by mouth 4 (four) times daily. 120 tablet 2  . senna (SENOKOT) 8.6 MG tablet Take 2 tablets by mouth 3 (three) times daily  as needed for constipation.    . Ascorbic Acid (VITAMIN C WITH ROSE HIPS) 500 MG tablet Take 500 mg by mouth daily. (Patient not taking: Reported on 05/09/2020)    . Cholecalciferol (VITAMIN D3 PO) Take 4 tablets by mouth daily. Takes 4 tablets of Vitamin D3 (1000) daily. (Patient not taking: Reported on 05/09/2020)    . fluticasone (FLONASE) 50 MCG/ACT nasal spray Place 2 sprays into both nostrils daily. (Patient not taking: Reported on 05/09/2020) 16 g 1  . hydrocortisone (ANUSOL-HC) 2.5 % rectal cream Place 1 application rectally 2 (two) times daily. (Patient not taking: Reported on 05/09/2020) 30 g 1  . hydrocortisone (ANUSOL-HC) 25 MG suppository Place 1 suppository (25 mg total) rectally every 12 (twelve) hours. (Patient not taking: Reported on 05/09/2020) 12 suppository 1  . lubiprostone (AMITIZA) 24 MCG capsule Take 1 capsule (24 mcg total) by mouth 2 (two) times daily with a meal. 60 capsule 3  . Multiple Vitamins-Minerals (MULTIVITAMIN ADULTS 50+) TABS Take 1 tablet by mouth daily. (Patient not taking: Reported on 05/09/2020)    . Omega-3 Fatty Acids (FISH OIL) 1000 MG CAPS Take 1,000 mg by mouth daily.  (Patient not taking: Reported on 05/09/2020)    . pantoprazole (PROTONIX) 40 MG tablet Take 1 tablet (40 mg total) by mouth daily before breakfast. 30 tablet 5  . Turmeric 400 MG CAPS Take 400 mg by mouth daily.  (Patient not taking: Reported on 05/09/2020)     No current facility-administered medications for this visit.    Allergies as of 05/09/2020 - Review Complete 05/09/2020  Allergen Reaction Noted  . Levofloxacin Nausea Only and Other (See Comments)   . Ciprofloxacin  06/13/2015  . Sulfa antibiotics  12/02/2012    Family History  Problem Relation Age of Onset  . Bladder Cancer Mother        rare form   . Cancer Sister        Metastatic abdominal  . Emotional abuse Sister   . Stroke Sister   . Emotional abuse Sister   . Heart failure Father   . Stroke Father   . Alcohol abuse  Father   . Dementia  Paternal Aunt   . Alcohol abuse Paternal Uncle   . Dementia Paternal Uncle   . Dementia Paternal Grandmother   . Stroke Paternal Grandmother   . ADD / ADHD Cousin   . Bipolar disorder Cousin   . Anxiety disorder Cousin   . Depression Cousin        Committed suicide  . Alcohol abuse Cousin   . OCD Cousin   . Paranoid behavior Cousin   . Brain cancer Maternal Grandfather   . Heart defect Other        FAMILY HX  . Colon cancer Maternal Uncle   . Colon cancer Cousin   . Colon polyps Neg Hx   . Drug abuse Neg Hx   . Schizophrenia Neg Hx   . Seizures Neg Hx   . Sexual abuse Neg Hx   . Physical abuse Neg Hx     Social History   Socioeconomic History  . Marital status: Divorced    Spouse name: Not on file  . Number of children: Not on file  . Years of education: Not on file  . Highest education level: Not on file  Occupational History  . Not on file  Tobacco Use  . Smoking status: Former Smoker    Packs/day: 1.50    Years: 8.00    Pack years: 12.00    Types: Cigarettes    Quit date: 08/13/1986    Years since quitting: 33.7  . Smokeless tobacco: Never Used  . Tobacco comment: quit 20 + yrs ago  Vaping Use  . Vaping Use: Never used  Substance and Sexual Activity  . Alcohol use: Not Currently    Alcohol/week: 0.0 standard drinks    Comment: Used to drink heavily- went to treatment in May 2020.   Marland Kitchen Drug use: Not Currently    Comment: pain medications, klonopin- none in the last 17 months. Currently in [place]  . Sexual activity: Never  Other Topics Concern  . Not on file  Social History Narrative  . Not on file   Social Determinants of Health   Financial Resource Strain: Low Risk   . Difficulty of Paying Living Expenses: Not hard at all  Food Insecurity: No Food Insecurity  . Worried About Charity fundraiser in the Last Year: Never true  . Ran Out of Food in the Last Year: Never true  Transportation Needs: No Transportation Needs  . Lack of  Transportation (Medical): No  . Lack of Transportation (Non-Medical): No  Physical Activity: Sufficiently Active  . Days of Exercise per Week: 4 days  . Minutes of Exercise per Session: 40 min  Stress: No Stress Concern Present  . Feeling of Stress : Only a little  Social Connections: Moderately Integrated  . Frequency of Communication with Friends and Family: More than three times a week  . Frequency of Social Gatherings with Friends and Family: More than three times a week  . Attends Religious Services: More than 4 times per year  . Active Member of Clubs or Organizations: Yes  . Attends Archivist Meetings: More than 4 times per year  . Marital Status: Divorced    Review of Systems: Gen: Denies fever, chills, cold or flu like symptoms, pre-syncope, or syncope.  CV: Denies chest pain. Resp: Denies dyspnea or cough.  GI: See HPI  Heme: See HPI.  Physical Exam: BP (!) 146/85   Pulse 72   Temp (!) 97.1 F (36.2 C) (Oral)   Ht 5\' 10"  (  1.778 m)   Wt 145 lb 6.4 oz (66 kg)   BMI 20.86 kg/m  General:   Alert and oriented. No distress noted. Pleasant and cooperative.  Head:  Normocephalic and atraumatic. Eyes:  Conjuctiva clear without scleral icterus. Heart:  S1, S2 present without murmurs appreciated. Lungs:  Clear to auscultation bilaterally. No wheezes, rales, or rhonchi. No distress.  Abdomen:  +BS, soft, and non-distended. Mild TTP in epigastric area. No rebound or guarding. No HSM or masses noted. Msk:  Symmetrical without gross deformities. Normal posture. Extremities:  Without edema. Neurologic:  Alert and  oriented x4 Psych:  Normal mood and affect.

## 2020-05-09 ENCOUNTER — Ambulatory Visit (INDEPENDENT_AMBULATORY_CARE_PROVIDER_SITE_OTHER): Payer: Medicare HMO | Admitting: Gastroenterology

## 2020-05-09 ENCOUNTER — Telehealth: Payer: Self-pay | Admitting: Internal Medicine

## 2020-05-09 ENCOUNTER — Other Ambulatory Visit: Payer: Self-pay

## 2020-05-09 ENCOUNTER — Encounter: Payer: Self-pay | Admitting: Gastroenterology

## 2020-05-09 ENCOUNTER — Encounter: Payer: Self-pay | Admitting: *Deleted

## 2020-05-09 ENCOUNTER — Telehealth: Payer: Self-pay | Admitting: *Deleted

## 2020-05-09 VITALS — BP 146/85 | HR 72 | Temp 97.1°F | Ht 70.0 in | Wt 145.4 lb

## 2020-05-09 DIAGNOSIS — K5900 Constipation, unspecified: Secondary | ICD-10-CM

## 2020-05-09 DIAGNOSIS — Z86010 Personal history of colonic polyps: Secondary | ICD-10-CM

## 2020-05-09 DIAGNOSIS — R1310 Dysphagia, unspecified: Secondary | ICD-10-CM | POA: Diagnosis not present

## 2020-05-09 DIAGNOSIS — K219 Gastro-esophageal reflux disease without esophagitis: Secondary | ICD-10-CM

## 2020-05-09 MED ORDER — LUBIPROSTONE 24 MCG PO CAPS: 24.0000 ug | ORAL_CAPSULE | Freq: Two times a day (BID) | ORAL | 3 refills | Status: DC

## 2020-05-09 MED ORDER — PANTOPRAZOLE SODIUM 40 MG PO TBEC: 40.0000 mg | DELAYED_RELEASE_TABLET | Freq: Every day | ORAL | 5 refills | Status: DC

## 2020-05-09 NOTE — Telephone Encounter (Signed)
Pt called to say that his insurance isn't covering Amitiza and was told he would need a prior authorization. [phone]

## 2020-05-09 NOTE — Assessment & Plan Note (Addendum)
New onset of dysphagia x1 month with foods hanging at the sternal notch daily requiring food regurgitation regardless of chewing thoroughly. This is in the setting of uncontrolled GERD which is addressed above. Recent EGD in April 2021 with normal appearing esophagus and no dilation as patient wasn't having dysphagia at that time.   Would not normally repeat EGD as EGD was just 5 months ago; however with such significant dysphagia symptoms, I feel patient needs EGD for evaluation and therapeutic intervention.   Plan:  1. Proceed with EGD +/- dilation with propofol with Dr. Abbey Chatters in the near future. The risks, benefits, and alternatives have been discussed with the patient in detail. The patient states understanding and desires to proceed.  ASA III Hold iron x 7 days prior to procedure.  2. Follow-up in November as already scheduled.

## 2020-05-09 NOTE — Assessment & Plan Note (Signed)
History of simple adenomas in 2008. Last colonoscopy in April 2021 was incomplete due to poor prep. He did have one 3 mm polyp in the ascending colon which was removed but not retrieved. He had been rescheduled for TCS but requested to reschedule twice. Due to this, he was advised to have an OV to discuss rescheduling. Patient prefers not to schedule colonoscopy at this time. He will discuss this at his next office visit in November.

## 2020-05-09 NOTE — Assessment & Plan Note (Addendum)
Not adequately controlled on omeprazole 20 mg daily.  Reports something coming up his esophagus constantly that he is having to swallow.  Also with morning nausea and upper abdominal bloating. He has also developed daily dysphagia x1 month with foods getting hung at the sternal notch requiring regurgitation despite chewing thoroughly.  He is taking 400 mg ibuprofen 3 times daily for joint pain. Denies melena. Recent EGD April 2021 with mild gastritis/duodenitis.  Suspect upper GI symptoms are likely influenced by frequent NSAID use.  Additional differentials include delayed gastric emptying or possible development of achalasia.    Plan: 1.  Stop omeprazole and start Protonix 40 mg daily.  Requested progress report in 2-week.  Consider increasing to twice daily if symptoms do not improve. 2.  Counseled on GERD diet/lifestyle. 3.  Stop ibuprofen and avoid all other NSAID products. 4.  May use Tylenol as needed for pain.  No more than 2000-3000 mg per 24 hours. 5.  Would not routinely repeat EGD as this was performed recently; however, considering significant dysphagia requiring regurgitation, planning to repeat EGD with esophageal dilation as appropriate with Dr. Abbey Chatters. ASA III  6. Patient will keep his November appt that is already scheduled for follow-up.

## 2020-05-09 NOTE — Assessment & Plan Note (Addendum)
Improved with Linzess 290 mcg daily but not adequately controlled. He is also taking 3 stool softeners 3 times daily. Currently having BMs every couple of days, occasional hard stools. Notes some upper abdominal bloating that improves with a BM. No brbpr or melena.   Plan:  1. Stop Linzess and trial Amitiza 24 mcg BID with meals. He will let us know if medication is too expensive. May need PA.  2. Would like to limit the amount of stool softeners he is taking. Depending on how Amitiza works, we may need to add MiraLAX daily.  3. He will follow-up in November as scheduled.

## 2020-05-09 NOTE — Patient Instructions (Signed)
We will be scheduled for an upper endoscopy with possible dilation of your esophagus in the near future with Dr. Abbey Chatters.  Please stop omeprazole and start Protonix 40 mg daily 30 minutes before breakfast.  Follow a GERD diet:  Avoid fried, fatty, greasy, spicy, citrus foods. Avoid caffeine and carbonated beverages. Avoid chocolate. Try eating 4-6 small meals a day rather than 3 large meals. Do not eat within 3 hours of laying down. Prop head of bed up on wood or bricks to create a 6 inch incline.  Please stop taking ibuprofen and avoid all NSAID products including Aleve, Advil, Goody powders, naproxen, and anything that says "NSAID" on the package.  You may take Tylenol as needed for pain.  No more than 2000-3000 mg/day.  For constipation: Please stop Linzess and start Amitiza.   I am sending Amitiza 24 mcg to your pharmacy.  You will take this medication with breakfast and dinner.  Please let us know if this medication is too expensive.  We may need to do prior authorization in order for your insurance to cover it.  We will keep your follow-up as scheduled in November.  Please call with a progress report in 2 weeks to let me know how Protonix is helping with your reflux symptoms and let me know how Amitiza is helping with your constipation.  Aliene Altes, PA-C [facility]

## 2020-05-09 NOTE — Telephone Encounter (Signed)
PA approved via [employer]. Auth# [id number] dates 05/28/2020-06/27/2020

## 2020-05-10 ENCOUNTER — Telehealth: Payer: Self-pay | Admitting: Internal Medicine

## 2020-05-10 ENCOUNTER — Other Ambulatory Visit: Payer: Self-pay | Admitting: Family Medicine

## 2020-05-10 DIAGNOSIS — I10 Essential (primary) hypertension: Secondary | ICD-10-CM

## 2020-05-10 NOTE — Telephone Encounter (Signed)
Spoke with pt. Medication is approved under insurance and doesn't need a PA. Medication is $300.00 per pt. Will discuss with pts insurance company. Pt isn't sure which medications are at a lower cost. Pt has tired Linzess and Senna.

## 2020-05-10 NOTE — Telephone Encounter (Signed)
PATIENT CALLED AND SAID HIS PHARMACY WILL NOT COVER HIS AMITEZA AND WOULD LIKE SOMETHING ELSE SENT IN

## 2020-05-13 NOTE — Telephone Encounter (Signed)
Spoke with pts insurance company. A PA was needed and submitted via phone. Waiting on an approval or denial. Ref Number [id number].

## 2020-05-21 ENCOUNTER — Other Ambulatory Visit: Payer: Self-pay

## 2020-05-21 ENCOUNTER — Encounter (HOSPITAL_COMMUNITY)
Admission: RE | Admit: 2020-05-21 | Discharge: 2020-05-21 | Disposition: A | Discharge status: Home / Self Care | Payer: Medicare HMO | Source: Ambulatory Visit | Attending: Internal Medicine | Admitting: Internal Medicine

## 2020-05-21 ENCOUNTER — Encounter (HOSPITAL_COMMUNITY): Payer: Self-pay

## 2020-05-21 DIAGNOSIS — Z01818 Encounter for other preprocedural examination: Secondary | ICD-10-CM | POA: Diagnosis not present

## 2020-05-21 LAB — PROTIME-INR
INR: 1 (ref 0.8–1.2)
Prothrombin Time: 13 seconds (ref 11.4–15.2)

## 2020-05-21 LAB — COMPREHENSIVE METABOLIC PANEL
ALT: 24 U/L (ref 0–44)
AST: 16 U/L (ref 15–41)
Albumin: 4.1 g/dL (ref 3.5–5.0)
Alkaline Phosphatase: 57 U/L (ref 38–126)
Anion gap: 9 (ref 5–15)
BUN: 18 mg/dL (ref 8–23)
CO2: 26 mmol/L (ref 22–32)
Calcium: 9.6 mg/dL (ref 8.9–10.3)
Chloride: 107 mmol/L (ref 98–111)
Creatinine, Ser: 1.02 mg/dL (ref 0.61–1.24)
GFR, Estimated: >60 mL/min (ref >60)
Glucose, Bld: 100 mg/dL — ABNORMAL HIGH (ref 70–99)
Potassium: 4.4 mmol/L (ref 3.5–5.1)
Sodium: 142 mmol/L (ref 135–145)
Total Bilirubin: 0.8 mg/dL (ref 0.3–1.2)
Total Protein: 6.5 g/dL (ref 6.5–8.1)

## 2020-05-21 LAB — CBC WITH DIFFERENTIAL/PLATELET
Abs Immature Granulocytes: 0 10*3/uL (ref 0.00–0.07)
Basophils Absolute: 0 10*3/uL (ref 0.0–0.1)
Basophils Relative: 1 %
Eosinophils Absolute: 0.2 10*3/uL (ref 0.0–0.5)
Eosinophils Relative: 5 %
HCT: 41.3 % (ref 39.0–52.0)
Hemoglobin: 13.4 g/dL (ref 13.0–17.0)
Immature Granulocytes: 0 %
Lymphocytes Relative: 30 %
Lymphs Abs: 1.1 10*3/uL (ref 0.7–4.0)
MCH: 30.6 pg (ref 26.0–34.0)
MCHC: 32.4 g/dL (ref 30.0–36.0)
MCV: 94.3 fL (ref 80.0–100.0)
Monocytes Absolute: 0.3 10*3/uL (ref 0.1–1.0)
Monocytes Relative: 8 %
Neutro Abs: 2.1 10*3/uL (ref 1.7–7.7)
Neutrophils Relative %: 56 %
Platelets: 169 10*3/uL (ref 150–400)
RBC: 4.38 MIL/uL (ref 4.22–5.81)
RDW: 13.7 % (ref 11.5–15.5)
WBC: 3.7 10*3/uL — ABNORMAL LOW (ref 4.0–10.5)
nRBC: 0 % (ref 0.0–0.2)

## 2020-05-21 NOTE — Patient Instructions (Signed)
Marc [name]  05/21/2020     @PREFPERIOPPHARMACY @   Your procedure is scheduled on  05/28/2020  Report to [facility] at  1230  P.M.  Call this number if you have problems the morning of surgery:  [phone]   Remember:  Follow the diet and pre instructions given to you by the office.                       Take these medicines the morning of surgery with A SIP OF WATER clonidine, protonix, seroquel.    Do not wear jewelry, make-up or nail polish.  Do not wear lotions, powders, or perfumes. Please wear deodorant and brush your teeth.  Do not shave 48 hours prior to surgery.  Men may shave face and neck.  Do not bring valuables to the hospital.  [facility] is not responsible for any belongings or valuables.  Contacts, dentures or bridgework may not be worn into surgery.  Leave your suitcase in the car.  After surgery it may be brought to your room.  For patients admitted to the hospital, discharge time will be determined by your treatment team.  Patients discharged the day of surgery will not be allowed to drive home.   Name and phone number of your driver:   family Special instructions:   DO NOT smoke the morning of your procedure.  Please read over the following fact sheets that you were given. Anesthesia Post-op Instructions and Care and Recovery After Surgery       Upper Endoscopy, Adult, Care After This sheet gives you information about how to care for yourself after your procedure. Your health care provider may also give you more specific instructions. If you have problems or questions, contact your health care provider. What can I expect after the procedure? After the procedure, it is common to have:  A sore throat.  Mild stomach pain or discomfort.  Bloating.  Nausea. Follow these instructions at home:   Follow instructions from your health care provider about what to eat or drink after your procedure.  Return to your normal activities  as told by your health care provider. Ask your health care provider what activities are safe for you.  Take over-the-counter and prescription medicines only as told by your health care provider.  Do not drive for 24 hours if you were given a sedative during your procedure.  Keep all follow-up visits as told by your health care provider. This is important. Contact a health care provider if you have:  A sore throat that lasts longer than one day.  Trouble swallowing. Get help right away if:  You vomit blood or your vomit looks like coffee grounds.  You have: ? A fever. ? Bloody, black, or tarry stools. ? A severe sore throat or you cannot swallow. ? Difficulty breathing. ? Severe pain in your chest or abdomen. Summary  After the procedure, it is common to have a sore throat, mild stomach discomfort, bloating, and nausea.  Do not drive for 24 hours if you were given a sedative during the procedure.  Follow instructions from your health care provider about what to eat or drink after your procedure.  Return to your normal activities as told by your health care provider. This information is not intended to replace advice given to you by your health care provider. Make sure you discuss any questions you have with your health care provider.  Document Revised: 01/18/2018 Document Reviewed: 12/27/2017 Elsevier Patient Education  [place].  Esophageal Dilatation Esophageal dilatation, also called esophageal dilation, is a procedure to widen or open (dilate) a blocked or narrowed part of the esophagus. The esophagus is the part of the body that moves food and liquid from the mouth to the stomach. You may need this procedure if:  You have a buildup of scar tissue in your esophagus that makes it difficult, painful, or impossible to swallow. This can be caused by gastroesophageal reflux disease (GERD).  You have cancer of the esophagus.  There is a problem with how food moves  through your esophagus. In some cases, you may need this procedure repeated at a later time to dilate the esophagus gradually. Tell a health care provider about:  Any allergies you have.  All medicines you are taking, including vitamins, herbs, eye drops, creams, and over-the-counter medicines.  Any problems you or family members have had with anesthetic medicines.  Any blood disorders you have.  Any surgeries you have had.  Any medical conditions you have.  Any antibiotic medicines you are required to take before dental procedures.  Whether you are pregnant or may be pregnant. What are the risks? Generally, this is a safe procedure. However, problems may occur, including:  Bleeding due to a tear in the lining of the esophagus.  A hole (perforation) in the esophagus. What happens before the procedure?  Follow instructions from your health care provider about eating or drinking restrictions.  Ask your health care provider about changing or stopping your regular medicines. This is especially important if you are taking diabetes medicines or blood thinners.  Plan to have someone take you home from the hospital or clinic.  Plan to have a responsible adult care for you for at least 24 hours after you leave the hospital or clinic. This is important. What happens during the procedure?  You may be given a medicine to help you relax (sedative).  A numbing medicine may be sprayed into the back of your throat, or you may gargle the medicine.  Your health care provider may perform the dilatation using various surgical instruments, such as: ? Simple dilators. This instrument is carefully placed in the esophagus to stretch it. ? Guided wire bougies. This involves using an endoscope to insert a wire into the esophagus. A dilator is passed over this wire to enlarge the esophagus. Then the wire is removed. ? Balloon dilators. An endoscope with a small balloon at the end is inserted into the  esophagus. The balloon is inflated to stretch the esophagus and open it up. The procedure may vary among health care providers and hospitals. What happens after the procedure?  Your blood pressure, heart rate, breathing rate, and blood oxygen level will be monitored until the medicines you were given have worn off.  Your throat may feel slightly sore and numb. This will improve slowly over time.  You will not be allowed to eat or drink until your throat is no longer numb.  When you are able to drink, urinate, and sit on the edge of the bed without nausea or dizziness, you may be able to return home. Follow these instructions at home:  Take over-the-counter and prescription medicines only as told by your health care provider.  Do not drive for 24 hours if you were given a sedative during your procedure.  You should have a responsible adult with you for 24 hours after the procedure.  Follow  instructions from your health care provider about any eating or drinking restrictions.  Do not use any products that contain nicotine or tobacco, such as cigarettes and e-cigarettes. If you need help quitting, ask your health care provider.  Keep all follow-up visits as told by your health care provider. This is important. Get help right away if you:  Have a fever.  Have chest pain.  Have pain that is not relieved by medication.  Have trouble breathing.  Have trouble swallowing.  Vomit blood. Summary  Esophageal dilatation, also called esophageal dilation, is a procedure to widen or open (dilate) a blocked or narrowed part of the esophagus.  Plan to have someone take you home from the hospital or clinic.  For this procedure, a numbing medicine may be sprayed into the back of your throat, or you may gargle the medicine.  Do not drive for 24 hours if you were given a sedative during your procedure. This information is not intended to replace advice given to you by your health care  provider. Make sure you discuss any questions you have with your health care provider. Document Revised: 05/24/2019 Document Reviewed: 06/01/2017 Elsevier Patient Education  2020 [place] After These instructions provide you with information about caring for yourself after your procedure. Your health care provider may also give you more specific instructions. Your treatment has been planned according to current medical practices, but problems sometimes occur. Call your health care provider if you have any problems or questions after your procedure. What can I expect after the procedure? After your procedure, you may:  Feel sleepy for several hours.  Feel clumsy and have poor balance for several hours.  Feel forgetful about what happened after the procedure.  Have poor judgment for several hours.  Feel nauseous or vomit.  Have a sore throat if you had a breathing tube during the procedure. Follow these instructions at home: For at least 24 hours after the procedure:      Have a responsible adult stay with you. It is important to have someone help care for you until you are awake and alert.  Rest as needed.  Do not: ? Participate in activities in which you could fall or become injured. ? Drive. ? Use heavy machinery. ? Drink alcohol. ? Take sleeping pills or medicines that cause drowsiness. ? Make important decisions or sign legal documents. ? Take care of children on your own. Eating and drinking  Follow the diet that is recommended by your health care provider.  If you vomit, drink water, juice, or soup when you can drink without vomiting.  Make sure you have little or no nausea before eating solid foods. General instructions  Take over-the-counter and prescription medicines only as told by your health care provider.  If you have sleep apnea, surgery and certain medicines can increase your risk for breathing problems. Follow  instructions from your health care provider about wearing your sleep device: ? Anytime you are sleeping, including during daytime naps. ? While taking prescription pain medicines, sleeping medicines, or medicines that make you drowsy.  If you smoke, do not smoke without supervision.  Keep all follow-up visits as told by your health care provider. This is important. Contact a health care provider if:  You keep feeling nauseous or you keep vomiting.  You feel light-headed.  You develop a rash.  You have a fever. Get help right away if:  You have trouble breathing. Summary  For several hours  after your procedure, you may feel sleepy and have poor judgment.  Have a responsible adult stay with you for at least 24 hours or until you are awake and alert. This information is not intended to replace advice given to you by your health care provider. Make sure you discuss any questions you have with your health care provider. Document Revised: 10/25/2017 Document Reviewed: 11/17/2015 Elsevier Patient Education  [place].

## 2020-05-27 ENCOUNTER — Other Ambulatory Visit: Payer: Self-pay

## 2020-05-27 ENCOUNTER — Other Ambulatory Visit (HOSPITAL_COMMUNITY)
Admission: RE | Admit: 2020-05-27 | Discharge: 2020-05-27 | Disposition: A | Discharge status: Home / Self Care | Payer: Medicare HMO | Source: Ambulatory Visit | Attending: Internal Medicine | Admitting: Internal Medicine

## 2020-05-27 DIAGNOSIS — Z20822 Contact with and (suspected) exposure to covid-19: Secondary | ICD-10-CM | POA: Diagnosis not present

## 2020-05-27 DIAGNOSIS — Z01818 Encounter for other preprocedural examination: Secondary | ICD-10-CM | POA: Insufficient documentation

## 2020-05-27 LAB — SARS CORONAVIRUS 2 (TAT 6-24 HRS): SARS Coronavirus 2: NEGATIVE

## 2020-05-28 ENCOUNTER — Ambulatory Visit (HOSPITAL_COMMUNITY): Payer: Medicare HMO | Admitting: Anesthesiology

## 2020-05-28 ENCOUNTER — Ambulatory Visit (HOSPITAL_COMMUNITY)
Admission: RE | Admit: 2020-05-28 | Discharge: 2020-05-28 | Disposition: A | Discharge status: Home / Self Care | Payer: Medicare HMO | Attending: Internal Medicine | Admitting: Internal Medicine

## 2020-05-28 ENCOUNTER — Encounter (HOSPITAL_COMMUNITY): Payer: Self-pay

## 2020-05-28 ENCOUNTER — Encounter (HOSPITAL_COMMUNITY)
Admission: RE | Disposition: A | Discharge status: Home / Self Care | Payer: Self-pay | Source: Home / Self Care | Attending: Internal Medicine

## 2020-05-28 DIAGNOSIS — I10 Essential (primary) hypertension: Secondary | ICD-10-CM | POA: Diagnosis not present

## 2020-05-28 DIAGNOSIS — Z820 Family history of epilepsy and other diseases of the nervous system: Secondary | ICD-10-CM | POA: Insufficient documentation

## 2020-05-28 DIAGNOSIS — Z800 Family history of malignant neoplasm of digestive organs: Secondary | ICD-10-CM | POA: Insufficient documentation

## 2020-05-28 DIAGNOSIS — Z882 Allergy status to sulfonamides status: Secondary | ICD-10-CM | POA: Insufficient documentation

## 2020-05-28 DIAGNOSIS — M109 Gout, unspecified: Secondary | ICD-10-CM | POA: Insufficient documentation

## 2020-05-28 DIAGNOSIS — Z791 Long term (current) use of non-steroidal anti-inflammatories (NSAID): Secondary | ICD-10-CM | POA: Insufficient documentation

## 2020-05-28 DIAGNOSIS — D696 Thrombocytopenia, unspecified: Secondary | ICD-10-CM | POA: Diagnosis not present

## 2020-05-28 DIAGNOSIS — M1990 Unspecified osteoarthritis, unspecified site: Secondary | ICD-10-CM | POA: Insufficient documentation

## 2020-05-28 DIAGNOSIS — F419 Anxiety disorder, unspecified: Secondary | ICD-10-CM | POA: Diagnosis not present

## 2020-05-28 DIAGNOSIS — K589 Irritable bowel syndrome without diarrhea: Secondary | ICD-10-CM | POA: Diagnosis not present

## 2020-05-28 DIAGNOSIS — Z79899 Other long term (current) drug therapy: Secondary | ICD-10-CM | POA: Diagnosis not present

## 2020-05-28 DIAGNOSIS — Z8249 Family history of ischemic heart disease and other diseases of the circulatory system: Secondary | ICD-10-CM | POA: Insufficient documentation

## 2020-05-28 DIAGNOSIS — R1310 Dysphagia, unspecified: Secondary | ICD-10-CM | POA: Diagnosis not present

## 2020-05-28 DIAGNOSIS — Z881 Allergy status to other antibiotic agents status: Secondary | ICD-10-CM | POA: Insufficient documentation

## 2020-05-28 DIAGNOSIS — Z888 Allergy status to other drugs, medicaments and biological substances status: Secondary | ICD-10-CM | POA: Diagnosis not present

## 2020-05-28 DIAGNOSIS — K219 Gastro-esophageal reflux disease without esophagitis: Secondary | ICD-10-CM | POA: Diagnosis not present

## 2020-05-28 DIAGNOSIS — Z87891 Personal history of nicotine dependence: Secondary | ICD-10-CM | POA: Diagnosis not present

## 2020-05-28 DIAGNOSIS — Z808 Family history of malignant neoplasm of other organs or systems: Secondary | ICD-10-CM | POA: Insufficient documentation

## 2020-05-28 DIAGNOSIS — Z7982 Long term (current) use of aspirin: Secondary | ICD-10-CM | POA: Insufficient documentation

## 2020-05-28 DIAGNOSIS — Z818 Family history of other mental and behavioral disorders: Secondary | ICD-10-CM | POA: Insufficient documentation

## 2020-05-28 DIAGNOSIS — K222 Esophageal obstruction: Secondary | ICD-10-CM

## 2020-05-28 DIAGNOSIS — Z86010 Personal history of colonic polyps: Secondary | ICD-10-CM | POA: Insufficient documentation

## 2020-05-28 DIAGNOSIS — F1120 Opioid dependence, uncomplicated: Secondary | ICD-10-CM | POA: Diagnosis not present

## 2020-05-28 DIAGNOSIS — B182 Chronic viral hepatitis C: Secondary | ICD-10-CM | POA: Diagnosis not present

## 2020-05-28 DIAGNOSIS — Z811 Family history of alcohol abuse and dependence: Secondary | ICD-10-CM | POA: Insufficient documentation

## 2020-05-28 DIAGNOSIS — Z8052 Family history of malignant neoplasm of bladder: Secondary | ICD-10-CM | POA: Diagnosis not present

## 2020-05-28 DIAGNOSIS — E7800 Pure hypercholesterolemia, unspecified: Secondary | ICD-10-CM | POA: Insufficient documentation

## 2020-05-28 DIAGNOSIS — F32A Depression, unspecified: Secondary | ICD-10-CM | POA: Insufficient documentation

## 2020-05-28 DIAGNOSIS — R7303 Prediabetes: Secondary | ICD-10-CM | POA: Diagnosis not present

## 2020-05-28 DIAGNOSIS — K2950 Unspecified chronic gastritis without bleeding: Secondary | ICD-10-CM | POA: Diagnosis not present

## 2020-05-28 DIAGNOSIS — R12 Heartburn: Secondary | ICD-10-CM | POA: Diagnosis not present

## 2020-05-28 DIAGNOSIS — K2970 Gastritis, unspecified, without bleeding: Secondary | ICD-10-CM | POA: Diagnosis not present

## 2020-05-28 DIAGNOSIS — Z823 Family history of stroke: Secondary | ICD-10-CM | POA: Insufficient documentation

## 2020-05-28 HISTORY — PX: ESOPHAGOGASTRODUODENOSCOPY (EGD) WITH PROPOFOL: SHX5813

## 2020-05-28 HISTORY — PX: BALLOON DILATION: SHX5330

## 2020-05-28 HISTORY — PX: BIOPSY: SHX5522

## 2020-05-28 SURGERY — ESOPHAGOGASTRODUODENOSCOPY (EGD) WITH PROPOFOL
Anesthesia: General

## 2020-05-28 MED ORDER — GLYCOPYRROLATE 0.2 MG/ML IJ SOLN
0.2000 mg | Freq: Once | INTRAMUSCULAR | Status: AC
  Administered 2020-05-28: 0.2 mg via INTRAVENOUS

## 2020-05-28 MED ORDER — PROPOFOL 10 MG/ML IV BOLUS
INTRAVENOUS | Status: DC | PRN
  Administered 2020-05-28: 150 ug/kg/min via INTRAVENOUS
  Administered 2020-05-28: 100 mg via INTRAVENOUS

## 2020-05-28 MED ORDER — LIDOCAINE VISCOUS HCL 2 % MT SOLN
15.0000 mL | Freq: Once | OROMUCOSAL | Status: AC
  Administered 2020-05-28: 15 mL via OROMUCOSAL

## 2020-05-28 MED ORDER — GLYCOPYRROLATE 0.2 MG/ML IJ SOLN
INTRAMUSCULAR | Status: AC
  Filled 2020-05-28: qty 1

## 2020-05-28 MED ORDER — STERILE WATER FOR IRRIGATION IR SOLN
Status: DC | PRN
  Administered 2020-05-28: 100 mL

## 2020-05-28 MED ORDER — CHLORHEXIDINE GLUCONATE CLOTH 2 % EX PADS: 6.0000 | MEDICATED_PAD | Freq: Once | CUTANEOUS | Status: DC

## 2020-05-28 MED ORDER — PANTOPRAZOLE SODIUM 40 MG PO TBEC: 40.0000 mg | DELAYED_RELEASE_TABLET | Freq: Two times a day (BID) | ORAL | 5 refills | Status: DC

## 2020-05-28 MED ORDER — LIDOCAINE HCL (CARDIAC) PF 100 MG/5ML IV SOSY
PREFILLED_SYRINGE | INTRAVENOUS | Status: DC | PRN
  Administered 2020-05-28: 100 mg via INTRAVENOUS

## 2020-05-28 MED ORDER — LIDOCAINE VISCOUS HCL 2 % MT SOLN
OROMUCOSAL | Status: AC
  Filled 2020-05-28: qty 15

## 2020-05-28 MED ORDER — LACTATED RINGERS IV SOLN
INTRAVENOUS | Status: DC
  Administered 2020-05-28: 1000 mL via INTRAVENOUS

## 2020-05-28 NOTE — Anesthesia Preprocedure Evaluation (Signed)
Anesthesia Evaluation  Patient identified by MRN, date of birth, ID band Patient awake    Reviewed: Allergy & Precautions, NPO status , Patient's Chart, lab work & pertinent test results  History of Anesthesia Complications Negative for: history of anesthetic complications  Airway Mallampati: III  TM Distance: >3 FB Neck ROM: Full    Dental  (+) Missing, Dental Advisory Given, Caps   Pulmonary neg pulmonary ROS, former smoker,    Pulmonary exam normal breath sounds clear to auscultation       Cardiovascular Exercise Tolerance: Good hypertension, Pt. on medications Normal cardiovascular exam Rhythm:Regular Rate:Normal  29-Dec-2017 22:09:04 [facility] ROUTINE RECORD Sinus rhythm Consider left atrial enlargement Probable anteroseptal infarct, old similar to prior 4/14 Confirmed by Aletta Edouard [phone]) on 12/29/2017 10:15:18 PM   Neuro/Psych PSYCHIATRIC DISORDERS Anxiety Depression    GI/Hepatic GERD  Medicated and Controlled,(+)     substance abuse  , Hepatitis -, C  Endo/Other  negative endocrine ROS  Renal/GU negative Renal ROS     Musculoskeletal  (+) Arthritis  (gout), narcotic dependent  Abdominal   Peds  Hematology   Anesthesia Other Findings   Reproductive/Obstetrics                             Anesthesia Physical  Anesthesia Plan  ASA: III  Anesthesia Plan: General   Post-op Pain Management:    Induction: Intravenous  PONV Risk Score and Plan:   Airway Management Planned: Nasal Cannula, Natural Airway and Simple Face Mask  Additional Equipment:   Intra-op Plan:   Post-operative Plan:   Informed Consent: I have reviewed the patients History and Physical, chart, labs and discussed the procedure including the risks, benefits and alternatives for the proposed anesthesia with the patient or authorized representative who has indicated his/her  understanding and acceptance.     Dental advisory given  Plan Discussed with: CRNA and Surgeon  Anesthesia Plan Comments:         Anesthesia Quick Evaluation

## 2020-05-28 NOTE — Discharge Instructions (Signed)
EGD Discharge instructions Please read the instructions outlined below and refer to this sheet in the next few weeks. These discharge instructions provide you with general information on caring for yourself after you leave the hospital. Your doctor may also give you specific instructions. While your treatment has been planned according to the most current medical practices available, unavoidable complications occasionally occur. If you have any problems or questions after discharge, please call your doctor. ACTIVITY  You may resume your regular activity but move at a slower pace for the next 24 hours.   Take frequent rest periods for the next 24 hours.   Walking will help expel (get rid of) the air and reduce the bloated feeling in your abdomen.   No driving for 24 hours (because of the anesthesia (medicine) used during the test).   You may shower.   Do not sign any important legal documents or operate any machinery for 24 hours (because of the anesthesia used during the test).  NUTRITION  Drink plenty of fluids.   You may resume your normal diet.   Begin with a light meal and progress to your normal diet.   Avoid alcoholic beverages for 24 hours or as instructed by your caregiver.  MEDICATIONS  You may resume your normal medications unless your caregiver tells you otherwise.  WHAT YOU CAN EXPECT TODAY  You may experience abdominal discomfort such as a feeling of fullness or "gas" pains.  FOLLOW-UP  Your doctor will discuss the results of your test with you.  SEEK IMMEDIATE MEDICAL ATTENTION IF ANY OF THE FOLLOWING OCCUR:  Excessive nausea (feeling sick to your stomach) and/or vomiting.   Severe abdominal pain and distention (swelling).   Trouble swallowing.   Temperature over 101 F (37.8 C).   Rectal bleeding or vomiting of blood.   Your EGD showed a moderate amount of inflammation in your stomach.  I biopsied this rule out infection with a bacteria called H. pylori.   You did have with narrowing your esophagus which I dilated with a balloon.  Hopefully this helps with your swallowing.  I am going to increase your pantoprazole to 40 mg twice daily for the next 8 weeks.  You can decrease back down to once daily thereafter as tolerated.  Follow-up with Aliene Altes in 6 to 8 weeks or sooner if needed.  I hope you have a great rest of your week!  Elon Alas. Abbey Chatters, D.O. Gastroenterology and Hepatology [facility]

## 2020-05-28 NOTE — Interval H&P Note (Signed)
History and Physical Interval Note:  05/28/2020 2:12 PM  Marc [name]  has presented today for surgery, with the diagnosis of gerd, dysphagia.  The various methods of treatment have been discussed with the patient and family. After consideration of risks, benefits and other options for treatment, the patient has consented to  Procedure(s) with comments: ESOPHAGOGASTRODUODENOSCOPY (EGD) WITH PROPOFOL (N/A) - 2:30pm BALLOON DILATION (N/A) as a surgical intervention.  The patient's history has been reviewed, patient examined, no change in status, stable for surgery.  I have reviewed the patient's chart and labs.  Questions were answered to the patient's satisfaction.     Eloise Harman

## 2020-05-28 NOTE — Transfer of Care (Signed)
Immediate Anesthesia Transfer of Care Note  Patient: Marc [name]  Procedure(s) Performed: ESOPHAGOGASTRODUODENOSCOPY (EGD) WITH PROPOFOL (N/A ) BALLOON DILATION (N/A ) BIOPSY  Patient Location: PACU  Anesthesia Type:General  Level of Consciousness: awake, alert , oriented and patient cooperative  Airway & Oxygen Therapy: Patient Spontanous Breathing  Post-op Assessment: Report given to RN, Post -op Vital signs reviewed and stable and Patient moving all extremities  Post vital signs: Reviewed and stable  Last Vitals:  Vitals Value Taken Time  BP    Temp    Pulse    Resp    SpO2      Last Pain:  Vitals:   05/28/20 1418  PainSc: 0-No pain         Complications: No complications documented.

## 2020-05-28 NOTE — Op Note (Signed)
[facility] Patient Name: Marc [name] Procedure Date: 05/28/2020 2:13 PM MRN: [id number] Date of Birth: 09/13/1955 Attending MD: Elon Alas. Abbey Chatters DO CSN: [id number] Age: 64 Admit Type: Outpatient Procedure:                Upper GI endoscopy Indications:              Dysphagia, Heartburn Providers:                Elon Alas. Abbey Chatters, DO, Jessica Boudreaux, Janeece Riggers, RN, Suzan Garibaldi. Risa Grill, Technician,                            Randa Spike, Merchant [employer] officer Referring MD:              Medicines:                See the Anesthesia note for documentation of the                            administered medications Complications:            No immediate complications. Estimated Blood Loss:     Estimated blood loss was minimal. Procedure:                Pre-Anesthesia Assessment:                           - The anesthesia plan was to use monitored                            anesthesia care (MAC).                           After obtaining informed consent, the endoscope was                            passed under direct vision. Throughout the                            procedure, the patient's blood pressure, pulse, and                            oxygen saturations were monitored continuously. The                            [id number]-H190 ([id number]) scope was introduced through the                            mouth, and advanced to the second part of duodenum.                            The upper GI endoscopy was accomplished without                            difficulty. The patient tolerated the  procedure                            well. Scope In: 2:23:13 PM Scope Out: 2:29:33 PM Total Procedure Duration: 0 hours 6 minutes 20 seconds  Findings:      There is no endoscopic evidence of bleeding, areas of erosion,       esophagitis, hiatal hernia, ulcerations or varices in the entire       esophagus.      One benign-appearing, intrinsic mild stenosis was found  in the lower       third of the esophagus. The stenosis was traversed. A TTS dilator was       passed through the scope. Dilation with an 18-19-20 mm balloon dilator       was performed to 20 mm. The dilation site was examined and showed       moderate improvement in luminal narrowing.      Diffuse mild inflammation characterized by erythema was found in the       entire examined stomach. Biopsies were taken with a cold forceps for       Helicobacter pylori testing.      The duodenal bulb, first portion of the duodenum and second portion of       the duodenum were normal. Impression:               - Benign-appearing esophageal stenosis. Dilated.                           - Gastritis. Biopsied.                           - Normal duodenal bulb, first portion of the                            duodenum and second portion of the duodenum. Moderate Sedation:      Per Anesthesia Care Recommendation:           - Patient has a contact number available for                            emergencies. The signs and symptoms of potential                            delayed complications were discussed with the                            patient. Return to normal activities tomorrow.                            Written discharge instructions were provided to the                            patient.                           - Resume previous diet.                           - Continue present  medications.                           - Await pathology results.                           - Repeat upper endoscopy PRN for retreatment.                           - Return to GI clinic in 8 weeks.                           - Use Protonix (pantoprazole) 40 mg PO BID for 8                            weeks. Procedure Code(s):        --- Professional ---                           [phone], Esophagogastroduodenoscopy, flexible,                            transoral; with transendoscopic balloon dilation of                             esophagus (less than 30 mm diameter)                           43239, 59, Esophagogastroduodenoscopy, flexible,                            transoral; with biopsy, single or multiple Diagnosis Code(s):        --- Professional ---                           K22.2, Esophageal obstruction                           K29.70, Gastritis, unspecified, without bleeding                           R13.10, Dysphagia, unspecified                           R12, Heartburn CPT copyright 2019 American Medical Association. All rights reserved. The codes documented in this report are preliminary and upon coder review may  be revised to meet current compliance requirements. Elon Alas. Abbey Chatters, DO [place] Abbey Chatters, DO 05/28/2020 2:37:40 PM This report has been signed electronically. Number of Addenda: 0

## 2020-05-28 NOTE — Anesthesia Postprocedure Evaluation (Signed)
Anesthesia Post Note  Patient: Marc [name]  Procedure(s) Performed: ESOPHAGOGASTRODUODENOSCOPY (EGD) WITH PROPOFOL (N/A ) BALLOON DILATION (N/A ) BIOPSY  Patient location during evaluation: PACU Anesthesia Type: General Level of consciousness: awake, oriented, awake and alert and patient cooperative Pain management: pain level controlled Vital Signs Assessment: post-procedure vital signs reviewed and stable Respiratory status: spontaneous breathing, respiratory function stable and nonlabored ventilation Cardiovascular status: blood pressure returned to baseline and stable Postop Assessment: no headache and no backache Anesthetic complications: no   No complications documented.   Last Vitals: There were no vitals filed for this visit.  Last Pain:  Vitals:   05/28/20 1418  PainSc: 0-No pain                 Tacy Learn

## 2020-05-30 ENCOUNTER — Other Ambulatory Visit: Payer: Self-pay

## 2020-05-30 LAB — SURGICAL PATHOLOGY

## 2020-05-31 ENCOUNTER — Encounter (HOSPITAL_COMMUNITY): Payer: Self-pay | Admitting: Internal Medicine

## 2020-06-04 ENCOUNTER — Other Ambulatory Visit: Payer: Self-pay

## 2020-06-04 ENCOUNTER — Other Ambulatory Visit: Payer: Self-pay | Admitting: Family Medicine

## 2020-06-04 ENCOUNTER — Encounter: Payer: Self-pay | Admitting: Family Medicine

## 2020-06-04 ENCOUNTER — Ambulatory Visit (INDEPENDENT_AMBULATORY_CARE_PROVIDER_SITE_OTHER): Payer: Medicare HMO | Admitting: Family Medicine

## 2020-06-04 ENCOUNTER — Ambulatory Visit: Payer: Medicare HMO | Admitting: Orthopaedic Surgery

## 2020-06-04 ENCOUNTER — Encounter: Payer: Self-pay | Admitting: Orthopaedic Surgery

## 2020-06-04 VITALS — BP 122/70 | HR 76 | Temp 97.6°F | Ht 70.0 in | Wt 146.0 lb

## 2020-06-04 VITALS — BP 159/90 | HR 70 | Ht 70.0 in | Wt 142.0 lb

## 2020-06-04 DIAGNOSIS — Z125 Encounter for screening for malignant neoplasm of prostate: Secondary | ICD-10-CM | POA: Diagnosis not present

## 2020-06-04 DIAGNOSIS — R590 Localized enlarged lymph nodes: Secondary | ICD-10-CM | POA: Insufficient documentation

## 2020-06-04 DIAGNOSIS — M65342 Trigger finger, left ring finger: Secondary | ICD-10-CM | POA: Diagnosis not present

## 2020-06-04 DIAGNOSIS — Z23 Encounter for immunization: Secondary | ICD-10-CM | POA: Insufficient documentation

## 2020-06-04 DIAGNOSIS — R7303 Prediabetes: Secondary | ICD-10-CM

## 2020-06-04 DIAGNOSIS — E559 Vitamin D deficiency, unspecified: Secondary | ICD-10-CM | POA: Diagnosis not present

## 2020-06-04 DIAGNOSIS — I10 Essential (primary) hypertension: Secondary | ICD-10-CM

## 2020-06-04 DIAGNOSIS — Z0001 Encounter for general adult medical examination with abnormal findings: Secondary | ICD-10-CM | POA: Diagnosis not present

## 2020-06-04 DIAGNOSIS — M65332 Trigger finger, left middle finger: Secondary | ICD-10-CM

## 2020-06-04 HISTORY — DX: Encounter for general adult medical examination with abnormal findings: Z00.01

## 2020-06-04 MED ORDER — LISINOPRIL 20 MG PO TABS: 20.0000 mg | ORAL_TABLET | Freq: Every day | ORAL | 1 refills | Status: DC

## 2020-06-04 MED ORDER — CLONIDINE HCL 0.1 MG PO TABS: 0.1000 mg | ORAL_TABLET | Freq: Every day | ORAL | 1 refills | Status: DC

## 2020-06-04 NOTE — Patient Instructions (Addendum)
  HAPPY FALL!  I appreciate the opportunity to provide you with care for your health and wellness. Today we discussed: overall health  Follow up: 6 weeks for BP. Lymph node under chin and neck recheck, and PNA 23 shot  Labs-fasting at [facility] today No referrals today Flu shot today  Medication change today Reduce clonidine to once daily for 3 weeks then you can stop it. Increase lisinopril to 20 mg now and continue until next visit.  Please continue to practice social distancing to keep you, your family, and our community safe.  If you must go out, please wear a mask and practice good handwashing.  It was a pleasure to see you and I look forward to continuing to work together on your health and well-being. Please do not hesitate to call the office if you need care or have questions about your care.  Have a wonderful day and week. With Gratitude, Cherly Beach, DNP, AGNP-BC  HEALTH MAINTENANCE RECOMMENDATIONS:  It is recommended that you get at least 30 minutes of aerobic exercise at least 5 days/week (for weight loss, you may need as much as 60-90 minutes). This can be any activity that gets your heart rate up. This can be divided in 10-15 minute intervals if needed, but try and build up your endurance at least once a week.  Weight bearing exercise is also recommended twice weekly.  Eat a healthy diet with lots of vegetables, fruits and fiber.  "Colorful" foods have a lot of vitamins (ie green vegetables, tomatoes, red peppers, etc).  Limit sweet tea, regular sodas and alcoholic beverages, all of which has a lot of calories and sugar.  Up to 2 alcoholic drinks daily may be beneficial for men (unless trying to lose weight, watch sugars).  Drink a lot of water.  Sunscreen of at least SPF 30 should be used on all sun-exposed parts of the skin when outside between the hours of 10 am and 4 pm (not just when at beach or pool, but even with exercise, golf, tennis, and yard work!)  Use a  sunscreen that says "broad spectrum" so it covers both UVA and UVB rays, and make sure to reapply every 1-2 hours.  Remember to change the batteries in your smoke detectors when changing your clock times in the spring and fall.  Use your seat belt every time you are in a car, and please drive safely and not be distracted with cell phones and texting while driving.

## 2020-06-04 NOTE — Assessment & Plan Note (Signed)

## 2020-06-04 NOTE — Assessment & Plan Note (Signed)
Enlarged submental lymph node on the left Will monitor for now

## 2020-06-04 NOTE — Assessment & Plan Note (Signed)
Updated labs ordered.

## 2020-06-04 NOTE — Assessment & Plan Note (Signed)
Discussed PSA screening (risks/benefits), recommended at least 30 minutes of aerobic activity at least 5 days/week; proper sunscreen use reviewed; healthy diet and alcohol recommendations (less than or equal to 2 drinks/day) reviewed; regular seatbelt use; changing batteries in smoke detectors. Immunization recommendations discussed.  Colonoscopy recommendations reviewed. ° °

## 2020-06-04 NOTE — Progress Notes (Signed)
Health Maintenance reviewed -  Immunization History  Administered Date(s) Administered  . Fluad Quad(high Dose 65+) 04/11/2019, 06/04/2020  . Influenza Split 06/20/2012  . Influenza,inj,Quad PF,6+ Mos 04/24/2013, 08/28/2014, 06/13/2015, 08/11/2017  . Moderna SARS-COVID-2 Vaccination 12/05/2019, 01/02/2020  . Pneumococcal Conjugate-13 02/28/2019  . Tdap 12/24/2011  . Zoster 10/04/2013   Last colonoscopy:  Up to date Last PSA: ordered today Dentist: up to date Ophtho: up to date does have cataract now in right eye Exercise: tries to be active Smoker: former  Alcohol Use: recovering   Other doctors caring for patient include:  Patient Care Team: Fayrene Helper, MD as PCP - General (Family Medicine) Leta Baptist, MD as Consulting Physician (Otolaryngology) Drema Dallas, MD (Physical Medicine and Rehabilitation) Milly Jakob, MD as Consulting Physician (Orthopedic Surgery) Cloria Spring, MD as Consulting Physician ([place]) Eloise Harman, DO as Consulting Physician (Internal Medicine)  End of Life Discussion:  Patient does not have a living will and medical power of attorney  Subjective:   HPI  Marc [name] is a 64 y.o. male who presents for annual visit and follow-up on chronic medical conditions.  He has the following concerns:  Reports sleeping ok, intermittently. Reports some trouble chewing or swallowing. Did have a recent EGD and esophageal stretch. Denies having changes in bowel or bladder habits. No blood in urine and stool. Denies having memory issues. Denies having falls or injury. Denies skin or hearing changes.   Flu vaccine today.  Next appointment will get Pneumovax.  He reports that he continues to have trouble with the clonidine and would like to try something else if able.  Review Of Systems  Review of Systems  Constitutional: Negative.   HENT: Negative.   Respiratory: Negative.   Cardiovascular: Negative.    Gastrointestinal: Negative.   Endocrine: Negative.   Genitourinary: Negative.   Musculoskeletal: Negative.   Skin: Negative.   Neurological: Negative.   Psychiatric/Behavioral: Negative.     Objective:   PHYSICAL EXAM:  BP 122/70 (BP Location: Right Arm, Patient Position: Sitting, Cuff Size: Normal)   Pulse 76   Temp 97.6 F (36.4 C) (Tympanic)   Ht 5\' 10"  (1.778 m)   Wt 146 lb (66.2 kg)   SpO2 98%   BMI 20.95 kg/m     Physical Exam Vitals and nursing note reviewed.  Constitutional:      Appearance: Normal appearance. He is well-developed, well-groomed and normal weight.  HENT:     Head: Normocephalic and atraumatic.     Right Ear: Hearing, tympanic membrane, ear canal and external ear normal.     Left Ear: Hearing, tympanic membrane, ear canal and external ear normal.     Nose: Nose normal.     Mouth/Throat:     Lips: Pink.     Mouth: Mucous membranes are moist.     Pharynx: Oropharynx is clear. Uvula midline.  Eyes:     General: Lids are normal.        Right eye: No discharge.        Left eye: No discharge.     Extraocular Movements: Extraocular movements intact.     Conjunctiva/sclera: Conjunctivae normal.     Pupils: Pupils are equal, round, and reactive to light.  Neck:     Thyroid: No thyroid mass, thyromegaly or thyroid tenderness.     Comments: Swollen lymph node under chin left side  Cardiovascular:     Rate and Rhythm: Normal rate and regular rhythm.  Pulses: Normal pulses.     Heart sounds: Normal heart sounds.  Pulmonary:     Effort: Pulmonary effort is normal.     Breath sounds: Normal breath sounds.  Abdominal:     General: Bowel sounds are normal. There is no distension.     Palpations: Abdomen is soft.     Tenderness: There is no abdominal tenderness. There is no right CVA tenderness or left CVA tenderness.     Hernia: No hernia is present.  Musculoskeletal:        General: Normal range of motion.     Cervical back: Normal range of  motion and neck supple.     Right lower leg: No edema.     Left lower leg: No edema.     Comments: MAE, ROM intact   Skin:    General: Skin is warm and dry.     Capillary Refill: Capillary refill takes less than 2 seconds.  Neurological:     General: No focal deficit present.     Mental Status: He is alert and oriented to person, place, and time.     Cranial Nerves: Cranial nerves are intact.     Sensory: Sensation is intact.     Motor: Motor function is intact.     Coordination: Coordination is intact.     Gait: Gait is intact.     Deep Tendon Reflexes: Reflexes are normal and symmetric.  Psychiatric:        Attention and Perception: Attention normal.        Mood and Affect: Mood and affect normal.        Speech: Speech normal.        Behavior: Behavior normal. Behavior is cooperative.        Thought Content: Thought content normal.        Cognition and Memory: Cognition and memory normal.        Judgment: Judgment normal.     Depression Screening  Depression screen [facility] 2/9 06/04/2020 06/04/2020 03/04/2020 02/29/2020 10/12/2019  Decreased Interest 1 1 0 0 0  Down, Depressed, Hopeless 1 1 0 1 1  PHQ - 2 Score 2 2 0 1 1  Altered sleeping 1 1 - 0 1  Tired, decreased energy 1 1 - 1 1  Change in appetite 1 1 - 0 3  Feeling bad or failure about yourself  1 1 - 0 1  Trouble concentrating 1 0 - 0 0  Moving slowly or fidgety/restless 0 0 - 0 0  Suicidal thoughts 1 1 - 0 0  PHQ-9 Score 8 7 - 2 7  Difficult doing work/chores Somewhat difficult Somewhat difficult Not difficult at all Not difficult at all -  Some recent data might be hidden     Falls  Fall Risk  06/04/2020 03/04/2020 02/29/2020 11/30/2019 11/09/2019  Falls in the past year? 0 0 0 0 1  Number falls in past yr: 0 0 0 0 0  Injury with Fall? 0 0 0 0 0  Risk for fall due to : No Fall Risks - No Fall Risks - -  Follow up Falls evaluation completed - Falls evaluation completed - -    Assessment & Plan:   1. Annual visit  for general adult medical examination with abnormal findings   2. Essential hypertension   3. Prediabetes   4. Vitamin D deficiency   5. Encounter for screening for malignant neoplasm of prostate   6. Need for immunization against influenza  7. Enlarged submental lymph node     Tests ordered Orders Placed This Encounter  Procedures  . Flu Vaccine QUAD High Dose(Fluad)  . CBC  . Comprehensive metabolic panel  . Hemoglobin A1c  . Lipid panel  . PSA  . VITAMIN D 25 Hydroxy (Vit-D Deficiency, Fractures)   Plan: Please see assessment and plan per problem list above.   Meds ordered this encounter  Medications  . cloNIDine (CATAPRES) 0.1 MG tablet    Sig: Take 1 tablet (0.1 mg total) by mouth daily.    Dispense:  30 tablet    Refill:  1    Order Specific Question:   Supervising Provider    Answer:   SIMPSON, MARGARET E [[id number]]  . lisinopril (ZESTRIL) 20 MG tablet    Sig: Take 1 tablet (20 mg total) by mouth daily.    Dispense:  30 tablet    Refill:  1    Order Specific Question:   Supervising Provider    Answer:   Tula Nakayama E [[id number]]    I have personally reviewed: The patient's medical and social history Their use of alcohol, tobacco or illicit drugs Their current medications and supplements The patient's functional ability including ADLs,fall risks, home safety risks, cognitive, and hearing and visual impairment Diet and physical activities Evidence for depression or mood disorders  The patient's weight, height, BMI, and visual acuity have been recorded in the chart.  I have made referrals, counseling, and provided education to the patient based on review of the above and I have provided the patient with a written personalized care plan for preventive services.    Note: This dictation was prepared with Dragon dictation along with smaller phrase technology. Similar sounding words can be transcribed inadequately or may not be corrected upon review. Any transcriptional  errors that result from this process are unintentional.      Perlie Mayo, NP   06/04/2020

## 2020-06-04 NOTE — Assessment & Plan Note (Signed)
BP controlled today Wants to reduce to wean off clonidine due to dry mouth. Will reduce to once daily for taper for 3 weeks and increase ACE to 20 mg close follow up DASH diet and exercise encouraged

## 2020-06-04 NOTE — Progress Notes (Signed)
Patient [name], male DOB:11-Feb-1956, 64 y.o. AST:[id number]  Chief Complaint  Patient presents with  . Hand Pain    left hand pain, ring finger is worse, plays guitar. worse in last month    HPI  Marc [name] is a 64 y.o. male who has developed triggering of the left long and ring fingers.  The left long locks in place in flexion the ring does not yet.  He plays guitar and would like something done for it.  I have explained what a trigger finger is and that the definitive treatment is surgery.  He would like injection today and then set up appointment to see Dr. Aline Brochure for surgery.  He has no trauma, no numbness, no redness or swelling.   Body mass index is 20.37 kg/m.  ROS  Review of Systems  Constitutional: Positive for activity change.  Musculoskeletal: Positive for arthralgias, gait problem and joint swelling.  All other systems reviewed and are negative.   All other systems reviewed and are negative.  The following is a summary of the past history medically, past history surgically, known current medicines, social history and family history.  This information is gathered electronically by the computer from prior information and documentation.  I review this each visit and have found including this information at this point in the chart is beneficial and informative.    Past Medical History:  Diagnosis Date  . Acute encephalopathy 12/29/2017   Hospitalized 5/22 to 5/23 with acute encephalopathy, unclear etiology  . Acute gout involving toe of right foot 09/14/2019  . Allergy   . Anxiety   . Arthritis   . Back problem   . Chest wall pain 03/28/2017  . Chronic hepatitis C without hepatic coma ([place]) 01/04/2009   Qualifier: Diagnosis of  By: Westly Pam. 2008: CT A/P NO CIRRHOSIS AUG 2013 MRCP- DILATED CBD/NO CIRRHOSIS DEC 2013 AST 99-102  ALT 145-156 PLT 147 HB 13.4 ALB 4.4-4.7 T BIL 0.7   . Depression   . Dilation of biliary tract 07/14/2012   EUS JAN 2014  BENIGN CBD DILATION   . Epigastric pain 07/14/2012  . GERD (gastroesophageal reflux disease)   . Gout   . HCV (hepatitis C virus) 06/16/2015  . Hepatitis C 1979  . Hepatitis C reactive    LIVER BX 2008-CHRONIC ACTIVE HEPATITIS  . HTN (hypertension)   . Hx of adenomatous colonic polyps 2008   due for surveillance 2017  . Hypercholesterolemia   . IBS (irritable bowel syndrome)   . Irritable bowel syndrome 01/04/2009   Qualifier: Diagnosis of  By: Westly Pam.   . Knee pain   . Left forearm pain 08/15/2017  . Leukopenia 11/06/2015  . Narcotic addiction [facility]) 06/10/2011   [facility]- Oct 2012   . Normal cardiac stress test 05/25/2011   [facility] [place]  . Normal echocardiogram 05/25/2011   EF 55%, mild TR  . RUQ pain 07/14/2012  . Substance abuse ([place])    narcotic addiction  . Thrombocytopenia ([place]) 11/06/2015  . Trigger middle finger of left hand 06/20/2012    Past Surgical History:  Procedure Laterality Date  . APPENDECTOMY    . BALLOON DILATION N/A 05/28/2020   Procedure: BALLOON DILATION;  Surgeon: Eloise Harman, DO;  [facility];  Service: Endoscopy;  Laterality: N/A;  . BIOPSY  05/28/2020   Procedure: BIOPSY;  Surgeon: Eloise Harman, DO;  [facility];  Service:  Endoscopy;;  . CARPAL TUNNEL RELEASE     rt  . COLONOSCOPY  2008 [facility] ARS D100 V8 PHEN 12.5   2 SIMPLE ADENOMAS (< 1 CM)  . COLONOSCOPY WITH PROPOFOL N/A 12/05/2019   TI normal, 3 mm polyp in ascending colon, external and internal hemorrhoids. Polyp removed but not retrieved. Poor prep  . ESOPHAGOGASTRODUODENOSCOPY  04/26/2012   Dr. Oneida Alar: Non-erosive gastritis (inflammation) was found in the gastric antrum but no H.pylori; multiple biopsies (duodenal bx negative for Celiac)/ The mucosa of the esophagus appeared normal  . ESOPHAGOGASTRODUODENOSCOPY (EGD) WITH PROPOFOL N/A 12/05/2019   mild gastritis, duodenitis due to aspirin/NSAIDs.   Marland Kitchen  ESOPHAGOGASTRODUODENOSCOPY (EGD) WITH PROPOFOL N/A 05/28/2020   Procedure: ESOPHAGOGASTRODUODENOSCOPY (EGD) WITH PROPOFOL;  Surgeon: Eloise Harman, DO;  [facility];  Service: Endoscopy;  Laterality: N/A;  2:30pm  . EUS  09/08/2012   Dr. Ardis Hughs: CBD dilated but no stones. Query secondary to Sphincter of Oddi stenosis, ?dysfunction, but clinically without symptoms  . HARDWARE REMOVAL Right 02/12/2014   Procedure: HARDWARE REMOVAL;  Surgeon: Jolyn Nap, MD;  Location: [place];  Service: Orthopedics;  Laterality: Right;  . KNEE ARTHROSCOPY    . Neurostimulator implant    . POLYPECTOMY  12/05/2019   Procedure: POLYPECTOMY;  Surgeon: Danie Binder, MD;  [facility];  Service: Endoscopy;;  . right ring finger    . spinal stenosis, had screws put in neck  04/11/2009   DR KRITZER  . TONSILLECTOMY    . ULNA OSTEOTOMY Right 02/12/2014   Procedure: RIGHT ULNAR SHORTENING AND OSTEOTOMY ;  Surgeon: Jolyn Nap, MD;  Location: [place];  Service: Orthopedics;  Laterality: Right;  . ULNAR NERVE TRANSPOSITION    . UPPER GASTROINTESTINAL ENDOSCOPY  2008 [facility] ABD PAIN WEIGHT LOSS d100 v8 phen 12.5   NL  . WRIST SURGERY Right jan 2015   Dr. Edmonia Lynch    Family History  Problem Relation Age of Onset  . Bladder Cancer Mother        rare form   . Cancer Sister        Metastatic abdominal  . Emotional abuse Sister   . Stroke Sister   . Emotional abuse Sister   . Heart failure Father   . Stroke Father   . Alcohol abuse Father   . Dementia Paternal Aunt   . Alcohol abuse Paternal Uncle   . Dementia Paternal Uncle   . Dementia Paternal Grandmother   . Stroke Paternal Grandmother   . ADD / ADHD Cousin   . Bipolar disorder Cousin   . Anxiety disorder Cousin   . Depression Cousin        Committed suicide  . Alcohol abuse Cousin   . OCD Cousin   . Paranoid behavior Cousin   . Brain cancer Maternal Grandfather   . Heart defect  Other        FAMILY HX  . Colon cancer Maternal Uncle   . Colon cancer Cousin   . Colon polyps Neg Hx   . Drug abuse Neg Hx   . Schizophrenia Neg Hx   . Seizures Neg Hx   . Sexual abuse Neg Hx   . Physical abuse Neg Hx     Social History Social History   Tobacco Use  . Smoking status: Former Smoker    Packs/day: 1.50    Years: 8.00    Pack years: 12.00    Types: Cigarettes  Quit date: 08/13/1986    Years since quitting: 33.8  . Smokeless tobacco: Never Used  . Tobacco comment: quit 20 + yrs ago  Vaping Use  . Vaping Use: Never used  Substance Use Topics  . Alcohol use: Not Currently    Alcohol/week: 0.0 standard drinks    Comment: Used to drink heavily- went to treatment in May 2020.   Marland Kitchen Drug use: Not Currently    Comment: pain medications, klonopin- none in the last 17 months. Currently in AA    Allergies  Allergen Reactions  . Levofloxacin Nausea Only and Other (See Comments)    "Creepy" feeling, skin didn't feel right, sort of itchy.  . Ciprofloxacin     Sweating  . Sulfa Antibiotics     Current Outpatient Medications  Medication Sig Dispense Refill  . acetaminophen (TYLENOL) 500 MG tablet Take 1,000 mg by mouth every 8 (eight) hours as needed for moderate pain.     . Ascorbic Acid (VITAMIN C WITH ROSE HIPS) 500 MG tablet Take 500 mg by mouth daily.     Marland Kitchen aspirin EC 81 MG tablet Take 81 mg by mouth daily.    . cloNIDine (CATAPRES) 0.1 MG tablet Take 1 tablet (0.1 mg total) by mouth daily. 30 tablet 1  . Lactobacillus Rhamnosus, GG, (CULTURELLE PO) Take 2 capsules by mouth daily.     Marland Kitchen linaclotide (LINZESS) 290 MCG CAPS capsule Take 1 capsule (290 mcg total) by mouth daily before breakfast. 90 capsule 3  . lisinopril (ZESTRIL) 20 MG tablet Take 1 tablet (20 mg total) by mouth daily. 30 tablet 1  . mirtazapine (REMERON) 15 MG tablet Take 1 tablet (15 mg total) by mouth at bedtime. 30 tablet 2  . Nutritional Supplements (KETO PO) Take 1 tablet by mouth in the  morning, at noon, and at bedtime.     . Omega-3 Fatty Acids (FISH OIL) 1000 MG CAPS Take 1,000 mg by mouth daily.     . pantoprazole (PROTONIX) 40 MG tablet Take 1 tablet (40 mg total) by mouth 2 (two) times daily. 60 tablet 5  . QUEtiapine (SEROQUEL) 50 MG tablet Take 1 tablet (50 mg total) by mouth 4 (four) times daily. (Patient taking differently: Take 50 mg by mouth 2 (two) times daily. ) 120 tablet 2  . senna (SENOKOT) 8.6 MG tablet Take 2 tablets by mouth daily.     . Turmeric 400 MG CAPS Take 800 mg by mouth daily.     . Vitamin D3 (VITAMIN D) 25 MCG tablet Take 4,000 Units by mouth daily.      No current facility-administered medications for this visit.     Physical Exam  Blood pressure (!) 159/90, pulse 70, height 5\' 10"  (1.778 m), weight 142 lb (64.4 kg).  Constitutional: overall normal hygiene, normal nutrition, well developed, normal grooming, normal body habitus. Assistive device:none  Musculoskeletal: gait and station Limp none, muscle tone and strength are normal, no tremors or atrophy is present.  .  Neurological: coordination overall normal.  Deep tendon reflex/nerve stretch intact.  Sensation normal.  Cranial nerves II-XII intact.   Skin:   Normal overall no scars, lesions, ulcers or rashes. No psoriasis.  Psychiatric: Alert and oriented x 3.  Recent memory intact, remote memory unclear.  Normal mood and affect. Well groomed.  Good eye contact.  Cardiovascular: overall no swelling, no varicosities, no edema bilaterally, normal temperatures of the legs and arms, no clubbing, cyanosis and good capillary refill.  Lymphatic: palpation is  normal.  Fingers left ring and long fingers trigger, the long locks in flexion with pain over the A1 pulley area.  NV intact.  All other systems reviewed and are negative   The patient has been educated about the nature of the problem(s) and counseled on treatment options.  The patient appeared to understand what I have discussed and  is in agreement with it.  Encounter Diagnoses  Name Primary?  . Trigger finger, left middle finger Yes  . Trigger finger, left ring finger    Procedure note: After permission from the patient, the left palm was prepped.  The A1 pulley area of both the long and ring fingers on the left were individually injected with 1 % Xylocaine and 1 cc of DepoMedrol by sterile technique tolerated well.  PLAN Call if any problems.  Precautions discussed.  Continue current medications.   Return to clinic to see Dr. Aline Brochure for trigger finger surgery.   Electronically Signed Sanjuana Kava, MD 10/26/20211:42 PM

## 2020-06-05 ENCOUNTER — Other Ambulatory Visit: Payer: Self-pay | Admitting: Family Medicine

## 2020-06-05 DIAGNOSIS — E785 Hyperlipidemia, unspecified: Secondary | ICD-10-CM

## 2020-06-05 LAB — COMPLETE METABOLIC PANEL WITH GFR
AG Ratio: 2.2 (calc) (ref 1.0–2.5)
ALT: 16 U/L (ref 9–46)
AST: 18 U/L (ref 10–35)
Albumin: 4.7 g/dL (ref 3.6–5.1)
Alkaline phosphatase (APISO): 63 U/L (ref 35–144)
BUN: 18 mg/dL (ref 7–25)
CO2: 26 mmol/L (ref 20–32)
Calcium: 10.2 mg/dL (ref 8.6–10.3)
Chloride: 107 mmol/L (ref 98–110)
Creat: 1.05 mg/dL (ref 0.70–1.25)
GFR, Est African American: 85 mL/min/{1.73_m2} (ref >=60)
GFR, Est Non African American: 74 mL/min/{1.73_m2} (ref >=60)
Globulin: 2.1 g/dL (calc) (ref 1.9–3.7)
Glucose, Bld: 100 mg/dL — ABNORMAL HIGH (ref 65–99)
Potassium: 5 mmol/L (ref 3.5–5.3)
Sodium: 142 mmol/L (ref 135–146)
Total Bilirubin: 0.5 mg/dL (ref 0.2–1.2)
Total Protein: 6.8 g/dL (ref 6.1–8.1)

## 2020-06-05 LAB — LIPID PANEL
Cholesterol: 211 mg/dL — ABNORMAL HIGH (ref <200)
HDL: 55 mg/dL (ref >=40)
LDL Cholesterol (Calc): 140 mg/dL (calc) — ABNORMAL HIGH
Non-HDL Cholesterol (Calc): 156 mg/dL (calc) — ABNORMAL HIGH (ref <130)
Total CHOL/HDL Ratio: 3.8 (calc) (ref <5.0)
Triglycerides: 70 mg/dL (ref <150)

## 2020-06-05 LAB — CBC
HCT: 42.1 % (ref 38.5–50.0)
Hemoglobin: 14.2 g/dL (ref 13.2–17.1)
MCH: 31.3 pg (ref 27.0–33.0)
MCHC: 33.7 g/dL (ref 32.0–36.0)
MCV: 92.9 fL (ref 80.0–100.0)
MPV: 11.4 fL (ref 7.5–12.5)
Platelets: 148 10*3/uL (ref 140–400)
RBC: 4.53 10*6/uL (ref 4.20–5.80)
RDW: 12.7 % (ref 11.0–15.0)
WBC: 4.9 10*3/uL (ref 3.8–10.8)

## 2020-06-05 LAB — PSA: PSA: 0.94 ng/mL (ref <=4.0)

## 2020-06-05 LAB — VITAMIN D 25 HYDROXY (VIT D DEFICIENCY, FRACTURES): Vit D, 25-Hydroxy: 37 ng/mL (ref 30–100)

## 2020-06-05 LAB — HEMOGLOBIN A1C W/OUT EAG: Hgb A1c MFr Bld: 5.3 % of total Hgb (ref <5.7)

## 2020-06-05 MED ORDER — ATORVASTATIN CALCIUM 10 MG PO TABS: 10.0000 mg | ORAL_TABLET | Freq: Every day | ORAL | 3 refills | Status: DC

## 2020-06-12 ENCOUNTER — Telehealth: Payer: Self-pay

## 2020-06-12 ENCOUNTER — Telehealth: Payer: Self-pay | Admitting: Internal Medicine

## 2020-06-12 NOTE — Telephone Encounter (Signed)
Phoned pt Marc [name] on VM for pt to return call.

## 2020-06-12 NOTE — Telephone Encounter (Signed)
Dr. Abbey Chatters Pt called yesterday @9 :25am stating that after taking a nap he woke up to a lot of phlegm in his throat and doesn't understand where its coming from. Also states he was propped up on pillows when he was napping. Hasn't taken anything over the counter. Please advise.

## 2020-06-12 NOTE — Telephone Encounter (Signed)
The phlegm may not be GI related.  I would continue on twice daily PPI for the total period of 8 weeks as we discussed.  Follow-up with GI as previously scheduled.  Thank you

## 2020-06-12 NOTE — Telephone Encounter (Signed)
Pt. Returned call and I advised him of the instructions from Dr. Abbey Chatters to continue with PPI meds x2 daily and to follow up as previously scheduled.

## 2020-06-18 NOTE — Telephone Encounter (Signed)
Opened in error

## 2020-06-19 ENCOUNTER — Encounter: Payer: Self-pay | Admitting: Orthopedic Surgery

## 2020-06-19 ENCOUNTER — Other Ambulatory Visit: Payer: Self-pay

## 2020-06-19 ENCOUNTER — Ambulatory Visit: Payer: Medicare HMO | Admitting: Orthopedic Surgery

## 2020-06-19 VITALS — BP 126/84 | Ht 70.0 in | Wt 142.0 lb

## 2020-06-19 DIAGNOSIS — M65332 Trigger finger, left middle finger: Secondary | ICD-10-CM | POA: Diagnosis not present

## 2020-06-19 DIAGNOSIS — M65342 Trigger finger, left ring finger: Secondary | ICD-10-CM | POA: Diagnosis not present

## 2020-06-19 NOTE — Progress Notes (Signed)
Last injection 10/26  30 days before surgery

## 2020-06-19 NOTE — Patient Instructions (Signed)
Trigger Finger  Trigger finger, also called stenosing tenosynovitis,  is a condition that causes a finger to get stuck in a bent position. Each finger has a tendon, which is a tough, cord-like tissue that connects muscle to bone, and each tendon passes through a tunnel of tissue called a tendon sheath. To move your finger, your tendon needs to glide freely through the sheath. Trigger finger happens when the tendon or the sheath thickens, making it difficult to move your finger. Trigger finger can affect any finger or a thumb. It may affect more than one finger. Mild cases may clear up with rest and medicine. Severe cases require more treatment. What are the causes? Trigger finger is caused by a thickened finger tendon or tendon sheath. The cause of this thickening is not known. What increases the risk? The following factors may make you more likely to develop this condition:  Doing activities that require a strong grip.  Having rheumatoid arthritis, gout, or diabetes.  Being 40-60 years old.  Being male. What are the signs or symptoms? Symptoms of this condition include:  Pain when bending or straightening your finger.  Tenderness or swelling where your finger attaches to the palm of your hand.  A lump in the palm of your hand or on the inside of your finger.  Hearing a noise like a pop or a snap when you try to straighten your finger.  Feeling a catching or locking sensation when you try to straighten your finger.  Being unable to straighten your finger. How is this diagnosed? This condition is diagnosed based on your symptoms and a physical exam. How is this treated? This condition may be treated by:  Resting your finger and avoiding activities that make symptoms worse.  Wearing a finger splint to keep your finger extended.  Taking NSAIDs, such as ibuprofen, to relieve pain and swelling.  Doing gentle exercises to stretch the finger as told by your health care provider.   Having medicine that reduces swelling and inflammation (steroids) injected into the tendon sheath. Injections may need to be repeated.  Having surgery to open the tendon sheath. This may be done if other treatments do not work and you cannot straighten your finger. You may need physical therapy after surgery. Follow these instructions at home: If you have a splint:  Wear the splint as told by your health care provider. Remove it only as told by your health care provider.  Loosen it if your fingers tingle, become numb, or turn cold and blue.  Keep it clean.  If the splint is not waterproof: ? Do not let it get wet. ? Cover it with a watertight covering when you take a bath or shower. Managing pain, stiffness, and swelling     If directed, apply heat to the affected area as often as told by your health care provider. Use the heat source that your health care provider recommends, such as a moist heat pack or a heating pad.  Place a towel between your skin and the heat source.  Leave the heat on for 20-30 minutes.  Remove the heat if your skin turns bright red. This is especially important if you are unable to feel pain, heat, or cold. You may have a greater risk of getting burned. If directed, put ice on the painful area. To do this:  If you have a removable splint, remove it as told by your health care provider.  Put ice in a plastic bag.  Place a   towel between your skin and the bag or between your splint and the bag.  Leave the ice on for 20 minutes, 2-3 times a day.  Activity  Rest your finger as told by your health care provider. Avoid activities that make the pain worse.  Return to your normal activities as told by your health care provider. Ask your health care provider what activities are safe for you.  Do exercises as told by your health care provider.  Ask your health care provider when it is safe to drive if you have a splint on your hand. General instructions   Take over-the-counter and prescription medicines only as told by your health care provider.  Keep all follow-up visits as told by your health care provider. This is important. Contact a health care provider if:  Your symptoms are not improving with home care. Summary  Trigger finger, also called stenosing tenosynovitis, causes your finger to get stuck in a bent position. This can make it difficult and painful to straighten your finger.  This condition develops when a finger tendon or tendon sheath thickens.  Treatment may include resting your finger, wearing a splint, and taking medicines.  In severe cases, surgery to open the tendon sheath may be needed. This information is not intended to replace advice given to you by your health care provider. Make sure you discuss any questions you have with your health care provider. Document Revised: 12/12/2018 Document Reviewed: 12/12/2018 Elsevier Patient Education  2020 [employer]  

## 2020-06-19 NOTE — Progress Notes (Signed)
Chief Complaint  Patient presents with  . 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  He only got relief of locking and catching for a week and would like to proceed with surgery.  Review of systems negative for numbness tingling nausea vomiting chest pain shortness of breath  Past Medical History:  Diagnosis Date  . Acute encephalopathy 12/29/2017   Hospitalized 5/22 to 5/23 with acute encephalopathy, unclear etiology  . Acute gout involving toe of right foot 09/14/2019  . Allergy   . Anxiety   . Arthritis   . Back problem   . Chest wall pain 03/28/2017  . Chronic hepatitis C without hepatic coma ([place]) 01/04/2009   Qualifier: Diagnosis of  By: Westly Pam. 2008: CT A/P NO CIRRHOSIS AUG 2013 MRCP- DILATED CBD/NO CIRRHOSIS DEC 2013 AST 99-102  ALT 145-156 PLT 147 HB 13.4 ALB 4.4-4.7 T BIL 0.7   . Depression   . Dilation of biliary tract 07/14/2012   EUS JAN 2014 BENIGN CBD DILATION   . Epigastric pain 07/14/2012  . GERD (gastroesophageal reflux disease)   . Gout   . HCV (hepatitis C virus) 06/16/2015  . Hepatitis C 1979  . Hepatitis C reactive    LIVER BX 2008-CHRONIC ACTIVE HEPATITIS  . HTN (hypertension)   . Hx of adenomatous colonic polyps 2008   due for surveillance 2017  . Hypercholesterolemia   . IBS (irritable bowel syndrome)   . Irritable bowel syndrome 01/04/2009   Qualifier: Diagnosis of  By: Westly Pam.   . Knee pain   . Left forearm pain 08/15/2017  . Leukopenia 11/06/2015  . Narcotic addiction [facility]) 06/10/2011   [facility]- Oct 2012   . Normal cardiac stress test 05/25/2011   [facility] [place]  . Normal echocardiogram 05/25/2011   EF 55%, mild TR  . RUQ pain 07/14/2012  . Substance abuse ([place])    narcotic addiction  . Thrombocytopenia ([place]) 11/06/2015  . Trigger middle finger of left hand 06/20/2012   Past Surgical  History:  Procedure Laterality Date  . APPENDECTOMY    . BALLOON DILATION N/A 05/28/2020   Procedure: BALLOON DILATION;  Surgeon: Eloise Harman, DO;  [facility];  Service: Endoscopy;  Laterality: N/A;  . BIOPSY  05/28/2020   Procedure: BIOPSY;  Surgeon: Eloise Harman, DO;  [facility];  Service: Endoscopy;;  . CARPAL TUNNEL RELEASE     rt  . COLONOSCOPY  2008 [facility] ARS D100 V8 PHEN 12.5   2 SIMPLE ADENOMAS (< 1 CM)  . COLONOSCOPY WITH PROPOFOL N/A 12/05/2019   TI normal, 3 mm polyp in ascending colon, external and internal hemorrhoids. Polyp removed but not retrieved. Poor prep  . ESOPHAGOGASTRODUODENOSCOPY  04/26/2012   Dr. Oneida Alar: Non-erosive gastritis (inflammation) was found in the gastric antrum but no H.pylori; multiple biopsies (duodenal bx negative for Celiac)/ The mucosa of the esophagus appeared normal  . ESOPHAGOGASTRODUODENOSCOPY (EGD) WITH PROPOFOL N/A 12/05/2019   mild gastritis, duodenitis due to aspirin/NSAIDs.   Marland Kitchen ESOPHAGOGASTRODUODENOSCOPY (EGD) WITH PROPOFOL N/A 05/28/2020   Procedure: ESOPHAGOGASTRODUODENOSCOPY (EGD) WITH PROPOFOL;  Surgeon: Eloise Harman, DO;  [facility];  Service: Endoscopy;  Laterality: N/A;  2:30pm  . EUS  09/08/2012   Dr. Ardis Hughs: CBD dilated but no stones. Query secondary to Sphincter of Oddi stenosis, ?dysfunction, but  clinically without symptoms  . HARDWARE REMOVAL Right 02/12/2014   Procedure: HARDWARE REMOVAL;  Surgeon: Jolyn Nap, MD;  Location: [place];  Service: Orthopedics;  Laterality: Right;  . KNEE ARTHROSCOPY    . Neurostimulator implant    . POLYPECTOMY  12/05/2019   Procedure: POLYPECTOMY;  Surgeon: Danie Binder, MD;  [facility];  Service: Endoscopy;;  . right ring finger    . spinal stenosis, had screws put in neck  04/11/2009   DR KRITZER  . TONSILLECTOMY    . ULNA OSTEOTOMY Right 02/12/2014   Procedure: RIGHT ULNAR SHORTENING AND OSTEOTOMY ;  Surgeon:  Jolyn Nap, MD;  Location: [place];  Service: Orthopedics;  Laterality: Right;  . ULNAR NERVE TRANSPOSITION    . UPPER GASTROINTESTINAL ENDOSCOPY  2008 [facility] ABD PAIN WEIGHT LOSS d100 v8 phen 12.5   NL  . WRIST SURGERY Right jan 2015   Dr. Edmonia Lynch   Social History   Tobacco Use  . Smoking status: Former Smoker    Packs/day: 1.50    Years: 8.00    Pack years: 12.00    Types: Cigarettes    Quit date: 08/13/1986    Years since quitting: 33.8  . Smokeless tobacco: Never Used  . Tobacco comment: quit 20 + yrs ago  Vaping Use  . Vaping Use: Never used  Substance Use Topics  . Alcohol use: Not Currently    Alcohol/week: 0.0 standard drinks    Comment: Used to drink heavily- went to treatment in May 2020.   Marland Kitchen Drug use: Not Currently    Comment: pain medications, klonopin- none in the last 17 months. Currently in AA   Family History  Problem Relation Age of Onset  . Bladder Cancer Mother        rare form   . Cancer Sister        Metastatic abdominal  . Emotional abuse Sister   . Stroke Sister   . Emotional abuse Sister   . Heart failure Father   . Stroke Father   . Alcohol abuse Father   . Dementia Paternal Aunt   . Alcohol abuse Paternal Uncle   . Dementia Paternal Uncle   . Dementia Paternal Grandmother   . Stroke Paternal Grandmother   . ADD / ADHD Cousin   . Bipolar disorder Cousin   . Anxiety disorder Cousin   . Depression Cousin        Committed suicide  . Alcohol abuse Cousin   . OCD Cousin   . Paranoid behavior Cousin   . Brain cancer Maternal Grandfather   . Heart defect Other        FAMILY HX  . Colon cancer Maternal Uncle   . Colon cancer Cousin   . Colon polyps Neg Hx   . Drug abuse Neg Hx   . Schizophrenia Neg Hx   . Seizures Neg Hx   . Sexual abuse Neg Hx   . Physical abuse Neg Hx     BP 126/84   Ht 5\' 10"  (1.778 m)   Wt 142 lb (64.4 kg)   BMI 20.37 kg/m   Physical Exam Constitutional:      General: He is not in  acute distress.    Appearance: He is well-developed.  Cardiovascular:     Comments: No peripheral edema Skin:    General: Skin is warm and dry.  Neurological:     Mental Status: He is alert and oriented  to person, place, and time.     Sensory: No sensory deficit.     Coordination: Coordination normal.     Gait: Gait normal.     Deep Tendon Reflexes: Reflexes are normal and symmetric.    Left hand alignment looks normal skin is intact Neurovascular exam is normal I could not reproduce the triggering but he did have tenderness over the A1 pulley of the long and ring finger Flexor extensor tendons were intact joints were stable without subluxation or dislocation  Encounter Diagnoses  Name Primary?  . Trigger finger, left middle finger Yes  . Trigger finger, left ring finger     Patient had an injection on 26 October recommend 30-day interval before surgery to prevent any increased risk of infection and then proceed with a left ring and long finger trigger release

## 2020-07-01 NOTE — Patient Instructions (Signed)
Marc [name]  07/01/2020     @PREFPERIOPPHARMACY @   Your procedure is scheduled on  07/09/2020 .  Report to [name] at  [phone]  A.M.  Call this number if you have problems the morning of surgery:  [phone]   Remember:  Do not eat or drink after midnight.                       Take these medicines the morning of surgery with A SIP OF WATER  Clonidine, protonix, seroquel.     Do not wear jewelry, make-up or nail polish.  Do not wear lotions, powders, or perfumes, or deodorant.  Do not shave 48 hours prior to surgery.  Men may shave face and neck.  Do not bring valuables to the hospital.  [facility] is not responsible for any belongings or valuables.  Contacts, dentures or bridgework may not be worn into surgery.  Leave your suitcase in the car.  After surgery it may be brought to your room.  For patients admitted to the hospital, discharge time will be determined by your treatment team.  Patients discharged the day of surgery will not be allowed to drive home.   Name and phone number of your driver:   family Special instructions:  DO NOT smoke the morning of your procedure.  Please read over the following fact sheets that you were given. Anesthesia Post-op Instructions and Care and Recovery After Surgery       Incision Care, Adult An incision is a cut that a doctor makes in your skin for surgery (for a procedure). Most times, these cuts are closed after surgery. Your cut from surgery may be closed with stitches (sutures), staples, skin glue, or skin tape (adhesive strips). You may need to return to your doctor to have stitches or staples taken out. This may happen many days or many weeks after your surgery. The cut needs to be well cared for so it does not get infected. How to care for your cut Cut care   Follow instructions from your doctor about how to take care of your cut. Make sure you: ? Wash your hands with soap and water before you change  your bandage (dressing). If you cannot use soap and water, use hand sanitizer. ? Change your bandage as told by your doctor. ? Leave stitches, skin glue, or skin tape in place. They may need to stay in place for 2 weeks or longer. If tape strips get loose and curl up, you may trim the loose edges. Do not remove tape strips completely unless your doctor says it is okay.  Check your cut area every day for signs of infection. Check for: ? More redness, swelling, or pain. ? More fluid or blood. ? Warmth. ? Pus or a bad smell.  Ask your doctor how to clean the cut. This may include: ? Using mild soap and water. ? Using a clean towel to pat the cut dry after you clean it. ? Putting a cream or ointment on the cut. Do this only as told by your doctor. ? Covering the cut with a clean bandage.  Ask your doctor when you can leave the cut uncovered.  Do not take baths, swim, or use a hot tub until your doctor says it is okay. Ask your doctor if you can take showers. You may only be allowed to take sponge baths for bathing.  Medicines  If you were prescribed an antibiotic medicine, cream, or ointment, take the antibiotic or put it on the cut as told by your doctor. Do not stop taking or putting on the antibiotic even if your condition gets better.  Take over-the-counter and prescription medicines only as told by your doctor. General instructions  Limit movement around your cut. This helps healing. ? Avoid straining, lifting, or exercise for the first month, or for as long as told by your doctor. ? Follow instructions from your doctor about going back to your normal activities. ? Ask your doctor what activities are safe.  Protect your cut from the sun when you are outside for the first 6 months, or for as long as told by your doctor. Put on sunscreen around the scar or cover up the scar.  Keep all follow-up visits as told by your doctor. This is important. Contact a doctor if:  Your have more  redness, swelling, or pain around the cut.  You have more fluid or blood coming from the cut.  Your cut feels warm to the touch.  You have pus or a bad smell coming from the cut.  You have a fever or shaking chills.  You feel sick to your stomach (nauseous) or you throw up (vomit).  You are dizzy.  Your stitches or staples come undone. Get help right away if:  You have a red streak coming from your cut.  Your cut bleeds through the bandage and the bleeding does not stop with gentle pressure.  The edges of your cut open up and separate.  You have very bad (severe) pain.  You have a rash.  You are confused.  You pass out (faint).  You have trouble breathing and you have a fast heartbeat. This information is not intended to replace advice given to you by your health care provider. Make sure you discuss any questions you have with your health care provider. Document Revised: 12/14/2016 Document Reviewed: 04/03/2016 Elsevier Patient Education  [place].  Trigger Finger  Trigger finger, also called stenosing tenosynovitis,  is a condition that causes a finger to get stuck in a bent position. Each finger has a tendon, which is a tough, cord-like tissue that connects muscle to bone, and each tendon passes through a tunnel of tissue called a tendon sheath. To move your finger, your tendon needs to glide freely through the sheath. Trigger finger happens when the tendon or the sheath thickens, making it difficult to move your finger. Trigger finger can affect any finger or a thumb. It may affect more than one finger. Mild cases may clear up with rest and medicine. Severe cases require more treatment. What are the causes? Trigger finger is caused by a thickened finger tendon or tendon sheath. The cause of this thickening is not known. What increases the risk? The following factors may make you more likely to develop this condition:  Doing activities that require a strong  grip.  Having rheumatoid arthritis, gout, or diabetes.  Being 42-34 years old.  Being male. What are the signs or symptoms? Symptoms of this condition include:  Pain when bending or straightening your finger.  Tenderness or swelling where your finger attaches to the palm of your hand.  A lump in the palm of your hand or on the inside of your finger.  Hearing a noise like a pop or a snap when you try to straighten your finger.  Feeling a catching or locking sensation when you  try to straighten your finger.  Being unable to straighten your finger. How is this diagnosed? This condition is diagnosed based on your symptoms and a physical exam. How is this treated? This condition may be treated by:  Resting your finger and avoiding activities that make symptoms worse.  Wearing a finger splint to keep your finger extended.  Taking NSAIDs, such as ibuprofen, to relieve pain and swelling.  Doing gentle exercises to stretch the finger as told by your health care provider.  Having medicine that reduces swelling and inflammation (steroids) injected into the tendon sheath. Injections may need to be repeated.  Having surgery to open the tendon sheath. This may be done if other treatments do not work and you cannot straighten your finger. You may need physical therapy after surgery. Follow these instructions at home: If you have a splint:  Wear the splint as told by your health care provider. Remove it only as told by your health care provider.  Loosen it if your fingers tingle, become numb, or turn cold and blue.  Keep it clean.  If the splint is not waterproof: ? Do not let it get wet. ? Cover it with a watertight covering when you take a bath or shower. Managing pain, stiffness, and swelling     If directed, apply heat to the affected area as often as told by your health care provider. Use the heat source that your health care provider recommends, such as a moist heat pack  or a heating pad.  Place a towel between your skin and the heat source.  Leave the heat on for 20-30 minutes.  Remove the heat if your skin turns bright red. This is especially important if you are unable to feel pain, heat, or cold. You may have a greater risk of getting burned. If directed, put ice on the painful area. To do this:  If you have a removable splint, remove it as told by your health care provider.  Put ice in a plastic bag.  Place a towel between your skin and the bag or between your splint and the bag.  Leave the ice on for 20 minutes, 2-3 times a day.  Activity  Rest your finger as told by your health care provider. Avoid activities that make the pain worse.  Return to your normal activities as told by your health care provider. Ask your health care provider what activities are safe for you.  Do exercises as told by your health care provider.  Ask your health care provider when it is safe to drive if you have a splint on your hand. General instructions  Take over-the-counter and prescription medicines only as told by your health care provider.  Keep all follow-up visits as told by your health care provider. This is important. Contact a health care provider if:  Your symptoms are not improving with home care. Summary  Trigger finger, also called stenosing tenosynovitis, causes your finger to get stuck in a bent position. This can make it difficult and painful to straighten your finger.  This condition develops when a finger tendon or tendon sheath thickens.  Treatment may include resting your finger, wearing a splint, and taking medicines.  In severe cases, surgery to open the tendon sheath may be needed. This information is not intended to replace advice given to you by your health care provider. Make sure you discuss any questions you have with your health care provider. Document Revised: 12/12/2018 Document Reviewed: 12/12/2018 Elsevier Patient  Education  [place] These instructions provide you with information about caring for yourself after your procedure. Your health care provider may also give you more specific instructions. Your treatment has been planned according to current medical practices, but problems sometimes occur. Call your health care provider if you have any problems or questions after your procedure. What can I expect after the procedure? After your procedure, you may:  Feel sleepy for several hours.  Feel clumsy and have poor balance for several hours.  Feel forgetful about what happened after the procedure.  Have poor judgment for several hours.  Feel nauseous or vomit.  Have a sore throat if you had a breathing tube during the procedure. Follow these instructions at home: For at least 24 hours after the procedure:      Have a responsible adult stay with you. It is important to have someone help care for you until you are awake and alert.  Rest as needed.  Do not: ? Participate in activities in which you could fall or become injured. ? Drive. ? Use heavy machinery. ? Drink alcohol. ? Take sleeping pills or medicines that cause drowsiness. ? Make important decisions or sign legal documents. ? Take care of children on your own. Eating and drinking  Follow the diet that is recommended by your health care provider.  If you vomit, drink water, juice, or soup when you can drink without vomiting.  Make sure you have little or no nausea before eating solid foods. General instructions  Take over-the-counter and prescription medicines only as told by your health care provider.  If you have sleep apnea, surgery and certain medicines can increase your risk for breathing problems. Follow instructions from your health care provider about wearing your sleep device: ? Anytime you are sleeping, including during daytime naps. ? While taking prescription  pain medicines, sleeping medicines, or medicines that make you drowsy.  If you smoke, do not smoke without supervision.  Keep all follow-up visits as told by your health care provider. This is important. Contact a health care provider if:  You keep feeling nauseous or you keep vomiting.  You feel light-headed.  You develop a rash.  You have a fever. Get help right away if:  You have trouble breathing. Summary  For several hours after your procedure, you may feel sleepy and have poor judgment.  Have a responsible adult stay with you for at least 24 hours or until you are awake and alert. This information is not intended to replace advice given to you by your health care provider. Make sure you discuss any questions you have with your health care provider. Document Revised: 10/25/2017 Document Reviewed: 11/17/2015 Elsevier Patient Education  [place]. How to Use Chlorhexidine for Bathing Chlorhexidine gluconate (CHG) is a germ-killing (antiseptic) solution that is used to clean the skin. It can get rid of the bacteria that normally live on the skin and can keep them away for about 24 hours. To clean your skin with CHG, you may be given:  A CHG solution to use in the shower or as part of a sponge bath.  A prepackaged cloth that contains CHG. Cleaning your skin with CHG may help lower the risk for infection:  While you are staying in the intensive care unit of the hospital.  If you have a vascular access, such as a central line, to provide short-term or long-term access to your veins.  If you have  a catheter to drain urine from your bladder.  If you are on a ventilator. A ventilator is a machine that helps you breathe by moving air in and out of your lungs.  After surgery. What are the risks? Risks of using CHG include:  A skin reaction.  Hearing loss, if CHG gets in your ears.  Eye injury, if CHG gets in your eyes and is not rinsed out.  The CHG product  catching fire. Make sure that you avoid smoking and flames after applying CHG to your skin. Do not use CHG:  If you have a chlorhexidine allergy or have previously reacted to chlorhexidine.  On babies younger than 38 months of age. How to use CHG solution  Use CHG only as told by your health care provider, and follow the instructions on the label.  Use the full amount of CHG as directed. Usually, this is one bottle. During a shower Follow these steps when using CHG solution during a shower (unless your health care provider gives you different instructions): 1. Start the shower. 2. Use your normal soap and shampoo to wash your face and hair. 3. Turn off the shower or move out of the shower stream. 4. Pour the CHG onto a clean washcloth. Do not use any type of brush or rough-edged sponge. 5. Starting at your neck, lather your body down to your toes. Make sure you follow these instructions: ? If you will be having surgery, pay special attention to the part of your body where you will be having surgery. Scrub this area for at least 1 minute. ? Do not use CHG on your head or face. If the solution gets into your ears or eyes, rinse them well with water. ? Avoid your genital area. ? Avoid any areas of skin that have broken skin, cuts, or scrapes. ? Scrub your back and under your arms. Make sure to wash skin folds. 6. Let the lather sit on your skin for 1-2 minutes or as long as told by your health care provider. 7. Thoroughly rinse your entire body in the shower. Make sure that all body creases and crevices are rinsed well. 8. Dry off with a clean towel. Do not put any substances on your body afterward--such as powder, lotion, or perfume--unless you are told to do so by your health care provider. Only use lotions that are recommended by the manufacturer. 9. Put on clean clothes or pajamas. 10. If it is the night before your surgery, sleep in clean sheets.  During a sponge bath Follow these  steps when using CHG solution during a sponge bath (unless your health care provider gives you different instructions): 1. Use your normal soap and shampoo to wash your face and hair. 2. Pour the CHG onto a clean washcloth. 3. Starting at your neck, lather your body down to your toes. Make sure you follow these instructions: ? If you will be having surgery, pay special attention to the part of your body where you will be having surgery. Scrub this area for at least 1 minute. ? Do not use CHG on your head or face. If the solution gets into your ears or eyes, rinse them well with water. ? Avoid your genital area. ? Avoid any areas of skin that have broken skin, cuts, or scrapes. ? Scrub your back and under your arms. Make sure to wash skin folds. 4. Let the lather sit on your skin for 1-2 minutes or as long as told by your  health care provider. 5. Using a different clean, wet washcloth, thoroughly rinse your entire body. Make sure that all body creases and crevices are rinsed well. 6. Dry off with a clean towel. Do not put any substances on your body afterward--such as powder, lotion, or perfume--unless you are told to do so by your health care provider. Only use lotions that are recommended by the manufacturer. 7. Put on clean clothes or pajamas. 8. If it is the night before your surgery, sleep in clean sheets. How to use CHG prepackaged cloths  Only use CHG cloths as told by your health care provider, and follow the instructions on the label.  Use the CHG cloth on clean, dry skin.  Do not use the CHG cloth on your head or face unless your health care provider tells you to.  When washing with the CHG cloth: ? Avoid your genital area. ? Avoid any areas of skin that have broken skin, cuts, or scrapes. Before surgery Follow these steps when using a CHG cloth to clean before surgery (unless your health care provider gives you different instructions): 1. Using the CHG cloth, vigorously scrub the  part of your body where you will be having surgery. Scrub using a back-and-forth motion for 3 minutes. The area on your body should be completely wet with CHG when you are done scrubbing. 2. Do not rinse. Discard the cloth and let the area air-dry. Do not put any substances on the area afterward, such as powder, lotion, or perfume. 3. Put on clean clothes or pajamas. 4. If it is the night before your surgery, sleep in clean sheets.  For general bathing Follow these steps when using CHG cloths for general bathing (unless your health care provider gives you different instructions). 1. Use a separate CHG cloth for each area of your body. Make sure you wash between any folds of skin and between your fingers and toes. Wash your body in the following order, switching to a new cloth after each step: ? The front of your neck, shoulders, and chest. ? Both of your arms, under your arms, and your hands. ? Your stomach and groin area, avoiding the genitals. ? Your right leg and foot. ? Your left leg and foot. ? The back of your neck, your back, and your buttocks. 2. Do not rinse. Discard the cloth and let the area air-dry. Do not put any substances on your body afterward--such as powder, lotion, or perfume--unless you are told to do so by your health care provider. Only use lotions that are recommended by the manufacturer. 3. Put on clean clothes or pajamas. Contact a health care provider if:  Your skin gets irritated after scrubbing.  You have questions about using your solution or cloth. Get help right away if:  Your eyes become very red or swollen.  Your eyes itch badly.  Your skin itches badly and is red or swollen.  Your hearing changes.  You have trouble seeing.  You have swelling or tingling in your mouth or throat.  You have trouble breathing.  You swallow any chlorhexidine. Summary  Chlorhexidine gluconate (CHG) is a germ-killing (antiseptic) solution that is used to clean the  skin. Cleaning your skin with CHG may help to lower your risk for infection.  You may be given CHG to use for bathing. It may be in a bottle or in a prepackaged cloth to use on your skin. Carefully follow your health care provider's instructions and the instructions on the product  label.  Do not use CHG if you have a chlorhexidine allergy.  Contact your health care provider if your skin gets irritated after scrubbing. This information is not intended to replace advice given to you by your health care provider. Make sure you discuss any questions you have with your health care provider. Document Revised: 10/13/2018 Document Reviewed: 06/24/2017 Elsevier Patient Education  [place].

## 2020-07-02 ENCOUNTER — Telehealth: Payer: Self-pay | Admitting: Orthopedic Surgery

## 2020-07-02 NOTE — Telephone Encounter (Signed)
I spoke to patient he wants to RS his surgery to Jan 4th 2022

## 2020-07-02 NOTE — Telephone Encounter (Signed)
Marc [name],   Marc [name] called and left message saying he wants to move his procedure to another day.    Please call him at [phone].

## 2020-07-03 ENCOUNTER — Other Ambulatory Visit (HOSPITAL_COMMUNITY): Payer: Medicare HMO

## 2020-07-03 ENCOUNTER — Ambulatory Visit: Payer: Medicare HMO | Admitting: Gastroenterology

## 2020-07-03 ENCOUNTER — Encounter (HOSPITAL_COMMUNITY)
Admission: RE | Admit: 2020-07-03 | Discharge: 2020-07-03 | Disposition: A | Discharge status: Home / Self Care | Payer: Medicare HMO | Source: Ambulatory Visit | Attending: Orthopedic Surgery | Admitting: Orthopedic Surgery

## 2020-07-08 ENCOUNTER — Other Ambulatory Visit (HOSPITAL_COMMUNITY): Payer: Self-pay | Admitting: Psychiatry

## 2020-07-08 ENCOUNTER — Other Ambulatory Visit (HOSPITAL_COMMUNITY): Payer: Medicare HMO

## 2020-07-09 NOTE — Telephone Encounter (Signed)
Call for appt

## 2020-07-11 NOTE — Telephone Encounter (Signed)
error 

## 2020-07-11 NOTE — Progress Notes (Signed)
Referring Provider: Fayrene Helper, MD Primary Care Physician:  Fayrene Helper, MD Primary GI Physician: Dr. Abbey Chatters  Chief Complaint  Patient presents with  . Gastroesophageal Reflux    getting better with diet changes  . Dysphagia    "little better"    HPI:   Marc [name] is a 64 y.o. male presenting today for follow-up of GERD, dysphagia, constipation s/p EGD.  History of GERD, constipation, colon polyps, hemorrhoids, and hep C genotype 1a, elastography F3/F4 s/p treatment in the past with Harvoni and documented SVR.  Post treatment elastography with kPa 6.2 in February 2021.  EGD April 2021 with mild gastritis/duodenitis.  No varices.  Per Dr. Oneida Alar, no further ultrasound surveillance needed.  Colonoscopy with poor prep in April 2021, polyp removed but not retrieved.  Recommended repeat colonoscopy with soft diet for 7 days prior, full liquids 2 days prior with MiraLAX prep and clear liquids 1 day prior with TriLyte prep.  Last seen in our office 05/09/2020.  Stated he was always having something come up his esophagus into his throat.  Had stopped omeprazole for about 1 month and restarted about 4 days prior to office visit.  Felt foods were getting hung in his esophagus and would have to bring them back up.  This was occurring daily.  Mild morning nausea without vomiting.  Upper abdominal bloating but can be uncomfortable.  Bloating improved if able to have a bowel movement.  BMs every couple of days with Linzess 290 mcg daily, 3 stool softeners 3 times daily.  Ibuprofen 400 mg 3 times daily for joint pain.  Dentist had recently treated him with a steroid for lichen planus.  Plan to stop omeprazole and start Protonix 40 mg daily, requested progress report in 2 weeks, stop ibuprofen and avoid all NSAIDs, proceed with EGD, stop Linzess and trial Amitiza 24 mcg twice daily.  Patient preferred to hold off on colonoscopy for now.  Telephone note 05/09/2020 patient stating Amitiza  was too expensive. Documentation stating PA was submitted via phone and waiting on approval or denial no further documentation on this.  EGD 05/28/2020: Benign-appearing esophageal stenosis s/p dilation, gastritis s/p biopsy, normal examined duodenum.  Pathology with oxyntic mucosa with slight chronic inflammation, negative for H. pylori, metaplasia, dysplasia, or carcinoma. Recommended Protonix 40 mg twice daily x8 weeks.  Today:  Dysphagia: Not as bad. Still feels pills get hung daily. Food will get hung in the mid chest or upper throat area. Brings items up, swallows again, and items pass. Occurs with toast and apples.   GERD: Cut out coffee, soda, chocolate, fried foods, berries and still has GERD symptoms. Has a burning sensations coming up his esophagus. Not quite as bad as it was, but still having symptoms daily. Feels acid is bubbling up into his esophagus. Burning or cooling sensation in his chest when he is breathing. States he will bring up a mucous or white phlegm. This can be worse if he "lays down wrong".   Constipation: Tried MiraLAX with Linzess and this did not work. Had to return to taking senna. Having a BM daily to every 2-3 days. Stools are on the harder side and incomplete. Taking Linzess 290 daily and senna 4 tables daily. No blood in the stools or black stool.   Weight loss.Changed his diet quite a bit due to GERD, and he has started working out 3 days a week for 1.5 hours and walks 2-3 miles 6/7 days for the last  2 months.   Needs repeat colonoscopy. Wants to hold off.   Past Medical History:  Diagnosis Date  . Acute encephalopathy 12/29/2017   Hospitalized 5/22 to 5/23 with acute encephalopathy, unclear etiology  . Acute gout involving toe of right foot 09/14/2019  . Allergy   . Anxiety   . Arthritis   . Back problem   . Chest wall pain 03/28/2017  . Chronic hepatitis C without hepatic coma ([place]) 01/04/2009   Qualifier: Diagnosis of  By: Westly Pam. 2008: CT  A/P NO CIRRHOSIS AUG 2013 MRCP- DILATED CBD/NO CIRRHOSIS DEC 2013 AST 99-102  ALT 145-156 PLT 147 HB 13.4 ALB 4.4-4.7 T BIL 0.7   . Depression   . Dilation of biliary tract 07/14/2012   EUS JAN 2014 BENIGN CBD DILATION   . Epigastric pain 07/14/2012  . GERD (gastroesophageal reflux disease)   . Gout   . HCV (hepatitis C virus) 06/16/2015  . Hepatitis C 1979  . Hepatitis C reactive    LIVER BX 2008-CHRONIC ACTIVE HEPATITIS  . HTN (hypertension)   . Hx of adenomatous colonic polyps 2008   due for surveillance 2017  . Hypercholesterolemia   . IBS (irritable bowel syndrome)   . Irritable bowel syndrome 01/04/2009   Qualifier: Diagnosis of  By: Westly Pam.   . Knee pain   . Left forearm pain 08/15/2017  . Leukopenia 11/06/2015  . Narcotic addiction [facility]) 06/10/2011   [facility]- Oct 2012   . Normal cardiac stress test 05/25/2011   [facility] [place]  . Normal echocardiogram 05/25/2011   EF 55%, mild TR  . RUQ pain 07/14/2012  . Substance abuse ([place])    narcotic addiction  . Thrombocytopenia ([place]) 11/06/2015  . Trigger middle finger of left hand 06/20/2012    Past Surgical History:  Procedure Laterality Date  . APPENDECTOMY    . BALLOON DILATION N/A 05/28/2020   Procedure: BALLOON DILATION;  Surgeon: Eloise Harman, DO;  [facility];  Service: Endoscopy;  Laterality: N/A;  . BIOPSY  05/28/2020   Procedure: BIOPSY;  Surgeon: Eloise Harman, DO;  [facility];  Service: Endoscopy;;  . CARPAL TUNNEL RELEASE     rt  . COLONOSCOPY  2008 [facility] ARS D100 V8 PHEN 12.5   2 SIMPLE ADENOMAS (< 1 CM)  . COLONOSCOPY WITH PROPOFOL N/A 12/05/2019   TI normal, 3 mm polyp in ascending colon, external and internal hemorrhoids. Polyp removed but not retrieved. Poor prep  . ESOPHAGOGASTRODUODENOSCOPY  04/26/2012   Dr. Oneida Alar: Non-erosive gastritis (inflammation) was found in the gastric antrum but no H.pylori; multiple biopsies (duodenal bx  negative for Celiac)/ The mucosa of the esophagus appeared normal  . ESOPHAGOGASTRODUODENOSCOPY (EGD) WITH PROPOFOL N/A 12/05/2019   mild gastritis, duodenitis due to aspirin/NSAIDs.   Marland Kitchen ESOPHAGOGASTRODUODENOSCOPY (EGD) WITH PROPOFOL N/A 05/28/2020   Procedure: ESOPHAGOGASTRODUODENOSCOPY (EGD) WITH PROPOFOL;  Surgeon: Eloise Harman, DO;  [facility];  Service: Endoscopy;  Laterality: N/A;  2:30pm  . EUS  09/08/2012   Dr. Ardis Hughs: CBD dilated but no stones. Query secondary to Sphincter of Oddi stenosis, ?dysfunction, but clinically without symptoms  . HARDWARE REMOVAL Right 02/12/2014   Procedure: HARDWARE REMOVAL;  Surgeon: Jolyn Nap, MD;  Location: [place];  Service: Orthopedics;  Laterality: Right;  . KNEE ARTHROSCOPY    . Neurostimulator implant    . POLYPECTOMY  12/05/2019   Procedure: POLYPECTOMY;  Surgeon: Oneida Alar,  Marga Melnick, MD;  [facility];  Service: Endoscopy;;  . right ring finger    . spinal stenosis, had screws put in neck  04/11/2009   DR KRITZER  . TONSILLECTOMY    . ULNA OSTEOTOMY Right 02/12/2014   Procedure: RIGHT ULNAR SHORTENING AND OSTEOTOMY ;  Surgeon: Jolyn Nap, MD;  Location: [place];  Service: Orthopedics;  Laterality: Right;  . ULNAR NERVE TRANSPOSITION    . UPPER GASTROINTESTINAL ENDOSCOPY  2008 SLF ABD PAIN WEIGHT LOSS d100 v8 phen 12.5   NL  . WRIST SURGERY Right jan 2015   Dr. Edmonia Lynch    Current Outpatient Medications  Medication Sig Dispense Refill  . acetaminophen (TYLENOL) 500 MG tablet Take 1,000 mg by mouth every 8 (eight) hours as needed for moderate pain.     . Ascorbic Acid (VITAMIN C WITH ROSE HIPS) 500 MG tablet Take 500 mg by mouth daily.     Marland Kitchen aspirin EC 81 MG tablet Take 81 mg by mouth daily.    Marland Kitchen atorvastatin (LIPITOR) 10 MG tablet Take 1 tablet (10 mg total) by mouth daily. 30 tablet 3  . cloNIDine (CATAPRES) 0.1 MG tablet Take 1 tablet (0.1 mg total) by mouth daily. 30  tablet 1  . linaclotide (LINZESS) 290 MCG CAPS capsule Take 1 capsule (290 mcg total) by mouth daily before breakfast. 90 capsule 3  . lisinopril (ZESTRIL) 20 MG tablet Take 1 tablet (20 mg total) by mouth daily. 30 tablet 1  . mirtazapine (REMERON) 15 MG tablet TAKE (1) TABLET BY MOUTH AT BEDTIME. 30 tablet 0  . Nutritional Supplements (KETO PO) Take 1 tablet by mouth in the morning, at noon, and at bedtime.     . pantoprazole (PROTONIX) 40 MG tablet Take 1 tablet (40 mg total) by mouth 2 (two) times daily. 60 tablet 5  . Probiotic Product (PROBIOTIC DAILY PO) Take by mouth daily.    Marland Kitchen senna (SENOKOT) 8.6 MG tablet Take 2 tablets by mouth daily.     . Turmeric 400 MG CAPS Take 800 mg by mouth daily.     . Vitamin D3 (VITAMIN D) 25 MCG tablet Take 4,000 Units by mouth daily.     . QUEtiapine (SEROQUEL) 50 MG tablet Take 1 tablet (50 mg total) by mouth 4 (four) times daily. (Patient not taking: Reported on 07/12/2020) 120 tablet 2   No current facility-administered medications for this visit.    Allergies as of 07/12/2020 - Review Complete 07/12/2020  Allergen Reaction Noted  . Levofloxacin Nausea Only and Other (See Comments)   . Ciprofloxacin  06/13/2015  . Sulfa antibiotics  12/02/2012    Family History  Problem Relation Age of Onset  . Bladder Cancer Mother        rare form   . Cancer Sister        Metastatic abdominal  . Emotional abuse Sister   . Stroke Sister   . Emotional abuse Sister   . Heart failure Father   . Stroke Father   . Alcohol abuse Father   . Dementia Paternal Aunt   . Alcohol abuse Paternal Uncle   . Dementia Paternal Uncle   . Dementia Paternal Grandmother   . Stroke Paternal Grandmother   . ADD / ADHD Cousin   . Bipolar disorder Cousin   . Anxiety disorder Cousin   . Depression Cousin        Committed suicide  . Alcohol abuse Cousin   .  OCD Cousin   . Paranoid behavior Cousin   . Brain cancer Maternal Grandfather   . Heart defect Other         FAMILY HX  . Colon cancer Maternal Uncle   . Colon cancer Cousin   . Colon polyps Neg Hx   . Drug abuse Neg Hx   . Schizophrenia Neg Hx   . Seizures Neg Hx   . Sexual abuse Neg Hx   . Physical abuse Neg Hx     Social History   Socioeconomic History  . Marital status: Divorced    Spouse name: Not on file  . Number of children: Not on file  . Years of education: Not on file  . Highest education level: Not on file  Occupational History  . Not on file  Tobacco Use  . Smoking status: Former Smoker    Packs/day: 1.50    Years: 8.00    Pack years: 12.00    Types: Cigarettes    Quit date: 08/13/1986    Years since quitting: 33.9  . Smokeless tobacco: Never Used  . Tobacco comment: quit 20 + yrs ago  Vaping Use  . Vaping Use: Never used  Substance and Sexual Activity  . Alcohol use: Not Currently    Alcohol/week: 0.0 standard drinks    Comment: Used to drink heavily- went to treatment in May 2020.   Marland Kitchen Drug use: Not Currently    Comment: pain medications, klonopin- none in the last 17 months. Currently in [place]  . Sexual activity: Never  Other Topics Concern  . Not on file  Social History Narrative  . Not on file   Social Determinants of Health   Financial Resource Strain: Low Risk   . Difficulty of Paying Living Expenses: Not hard at all  Food Insecurity: No Food Insecurity  . Worried About Charity fundraiser in the Last Year: Never true  . Ran Out of Food in the Last Year: Never true  Transportation Needs: No Transportation Needs  . Lack of Transportation (Medical): No  . Lack of Transportation (Non-Medical): No  Physical Activity: Insufficiently Active  . Days of Exercise per Week: 3 days  . Minutes of Exercise per Session: 30 min  Stress: No Stress Concern Present  . Feeling of Stress : Only a little  Social Connections: Moderately Integrated  . Frequency of Communication with Friends and Family: More than three times a week  . Frequency of Social Gatherings with  Friends and Family: More than three times a week  . Attends Religious Services: More than 4 times per year  . Active Member of Clubs or Organizations: Yes  . Attends Archivist Meetings: More than 4 times per year  . Marital Status: Divorced    Review of Systems: Gen: Denies fever, chills, cold or flulike symptoms, lightheadedness, dizziness, presyncope, syncope. CV: Denies chest pain or palpitations. Resp: Denies dyspnea or cough.Marland Kitchen GI: See HPI Heme: See HPI  Physical Exam: BP 132/78   Pulse 68   Temp (!) 97.5 F (36.4 C) (Temporal)   Ht 5\' 10"  (1.778 m)   Wt 139 lb 9.6 oz (63.3 kg)   BMI 20.03 kg/m  General:   Alert and oriented. No distress noted. Pleasant and cooperative.  Head:  Normocephalic and atraumatic. Eyes:  Conjuctiva clear without scleral icterus. Heart:  S1, S2 present without murmurs appreciated. Lungs:  Clear to auscultation bilaterally. No wheezes, rales, or rhonchi. No distress.  Abdomen: +BS, soft, non-tender and  non-distended. No rebound or guarding. No HSM or masses noted. Msk:  Symmetrical without gross deformities. Normal posture. Extremities:  Without edema. Neurologic:  Alert and  oriented x4 Psych: Normal mood and affect.

## 2020-07-12 ENCOUNTER — Encounter: Payer: Self-pay | Admitting: Gastroenterology

## 2020-07-12 ENCOUNTER — Other Ambulatory Visit: Payer: Self-pay

## 2020-07-12 ENCOUNTER — Ambulatory Visit: Payer: Medicare HMO | Admitting: Gastroenterology

## 2020-07-12 ENCOUNTER — Telehealth: Payer: Self-pay | Admitting: Radiology

## 2020-07-12 VITALS — BP 132/78 | HR 68 | Temp 97.5°F | Ht 70.0 in | Wt 139.6 lb

## 2020-07-12 DIAGNOSIS — K5900 Constipation, unspecified: Secondary | ICD-10-CM

## 2020-07-12 DIAGNOSIS — R1310 Dysphagia, unspecified: Secondary | ICD-10-CM | POA: Diagnosis not present

## 2020-07-12 DIAGNOSIS — K219 Gastro-esophageal reflux disease without esophagitis: Secondary | ICD-10-CM

## 2020-07-12 NOTE — Patient Instructions (Addendum)
We will arrange for you to have a swallowing study.   Please stop Protonix and try Dexilant 60 mg once every morning. We are providing samples.  Call with progress report in 2 weeks.   We are looking into Amitiza again.   Continue Linzess 290 mcg daily.   You may try adding colace daily up to 3 capsules daily and use senna as needed.   Will place follow-up in 2 months. We will call you with your swallow study results.   Aliene Altes, PA-C [facility]

## 2020-07-12 NOTE — Telephone Encounter (Signed)
Done; patient aware of updated post op appointment.

## 2020-07-12 NOTE — Telephone Encounter (Signed)
Patient scheduled for post op next week His surgery was RS to 1//4 /21 can we RS the post op to 2 weeks after surgery?

## 2020-07-12 NOTE — Telephone Encounter (Signed)
Spoke with pts insurance company. Amitiza PA was denied and Lactulose is the only medication covered. The cost is $5.00. per Aliene Altes, PA, an appeal needs to be completed. Working on an appeal. Pt has taken Linzess, colace. Spoke with Ulice Dash from [place] and was give the fax [phone]. Information will be faxed.

## 2020-07-15 NOTE — Telephone Encounter (Signed)
Noted faxed. Waiting on an approval or denial.

## 2020-07-16 ENCOUNTER — Telehealth (INDEPENDENT_AMBULATORY_CARE_PROVIDER_SITE_OTHER): Payer: Medicare HMO | Admitting: Psychiatry

## 2020-07-16 ENCOUNTER — Other Ambulatory Visit: Payer: Self-pay

## 2020-07-16 ENCOUNTER — Encounter (HOSPITAL_COMMUNITY): Payer: Self-pay | Admitting: Psychiatry

## 2020-07-16 DIAGNOSIS — F331 Major depressive disorder, recurrent, moderate: Secondary | ICD-10-CM | POA: Diagnosis not present

## 2020-07-16 MED ORDER — MIRTAZAPINE 15 MG PO TABS
ORAL_TABLET | ORAL | 6 refills | Status: DC
Start: 2020-07-16 — End: 2021-01-15

## 2020-07-16 NOTE — Progress Notes (Signed)
Virtual Visit via Telephone Note  I connected with Marc [name] on 07/16/20 at  9:20 AM EST by telephone and verified that I am speaking with the correct person using two identifiers.  Location: Patient: home Provider: office   I discussed the limitations, risks, security and privacy concerns of performing an evaluation and management service by telephone and the availability of in person appointments. I also discussed with the patient that there may be a patient responsible charge related to this service. The patient expressed understanding and agreed to proceed.     I discussed the assessment and treatment plan with the patient. The patient was provided an opportunity to ask questions and all were answered. The patient agreed with the plan and demonstrated an understanding of the instructions.   The patient was advised to call back or seek an in-person evaluation if the symptoms worsen or if the condition fails to improve as anticipated.  I provided 15 minutes of non-face-to-face time during this encounter.   Marc Spiller, MD  [facility] MD/PA/NP OP Progress Note  07/16/2020 9:46 AM Marc [name]  MRN:  [id number]  Chief Complaint:  Chief Complaint    Depression; Follow-up     HPI: This patient is a 64 year old divorced white male who lives at 55 in [place].  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  He has 1 son in [place].  The patient returns for follow-up after about 6 months.  He states that he is doing very well.  He has been sober for 18 months and attends AA meetings daily.  His mood is good and he is sleeping well and denies significant anxiety.  He has been doing a lot more exercising.  He stopped the Seroquel because it made him feel sluggish.  The only medication he takes from here is mirtazapine 15 mg at bedtime which helps with his sleep and mood.  He denies thoughts of self-harm or suicidal ideation and sounds very upbeat Visit Diagnosis:     ICD-10-CM   1. Major depressive disorder, recurrent, moderate (HCC)  F33.1     Past Psychiatric History: Prior hospitalization for alcohol detox about 2 years ago  Past Medical History:  Past Medical History:  Diagnosis Date  . Acute encephalopathy 12/29/2017   Hospitalized 5/22 to 5/23 with acute encephalopathy, unclear etiology  . Acute gout involving toe of right foot 09/14/2019  . Allergy   . Anxiety   . Arthritis   . Back problem   . Chest wall pain 03/28/2017  . Chronic hepatitis C without hepatic coma ([place]) 01/04/2009   Qualifier: Diagnosis of  By: Westly Pam. 2008: CT A/P NO CIRRHOSIS AUG 2013 MRCP- DILATED CBD/NO CIRRHOSIS DEC 2013 AST 99-102  ALT 145-156 PLT 147 HB 13.4 ALB 4.4-4.7 T BIL 0.7   . Depression   . Dilation of biliary tract 07/14/2012   EUS JAN 2014 BENIGN CBD DILATION   . Epigastric pain 07/14/2012  . GERD (gastroesophageal reflux disease)   . Gout   . HCV (hepatitis C virus) 06/16/2015  . Hepatitis C 1979  . Hepatitis C reactive    LIVER BX 2008-CHRONIC ACTIVE HEPATITIS  . HTN (hypertension)   . Hx of adenomatous colonic polyps 2008   due for surveillance 2017  . Hypercholesterolemia   . IBS (irritable bowel syndrome)   . Irritable bowel syndrome 01/04/2009   Qualifier: Diagnosis of  By: Westly Pam.   . Knee pain   .  Left forearm pain 08/15/2017  . Leukopenia 11/06/2015  . Narcotic addiction [facility]) 06/10/2011   [facility]- Oct 2012   . Normal cardiac stress test 05/25/2011   [facility]  . Normal echocardiogram 05/25/2011   EF 55%, mild TR  . RUQ pain 07/14/2012  . Substance abuse ([place])    narcotic addiction  . Thrombocytopenia ([place]) 11/06/2015  . Trigger middle finger of left hand 06/20/2012    Past Surgical History:  Procedure Laterality Date  . APPENDECTOMY    . BALLOON DILATION N/A 05/28/2020   Procedure: BALLOON DILATION;  Surgeon: Eloise Harman, DO;  [facility];  Service:  Endoscopy;  Laterality: N/A;  . BIOPSY  05/28/2020   Procedure: BIOPSY;  Surgeon: Eloise Harman, DO;  [facility];  Service: Endoscopy;;  . CARPAL TUNNEL RELEASE     rt  . COLONOSCOPY  2008 [facility] ARS D100 V8 PHEN 12.5   2 SIMPLE ADENOMAS (< 1 CM)  . COLONOSCOPY WITH PROPOFOL N/A 12/05/2019   TI normal, 3 mm polyp in ascending colon, external and internal hemorrhoids. Polyp removed but not retrieved. Poor prep  . ESOPHAGOGASTRODUODENOSCOPY  04/26/2012   Dr. Oneida Alar: Non-erosive gastritis (inflammation) was found in the gastric antrum but no H.pylori; multiple biopsies (duodenal bx negative for Celiac)/ The mucosa of the esophagus appeared normal  . ESOPHAGOGASTRODUODENOSCOPY (EGD) WITH PROPOFOL N/A 12/05/2019   mild gastritis, duodenitis due to aspirin/NSAIDs.   Marland Kitchen ESOPHAGOGASTRODUODENOSCOPY (EGD) WITH PROPOFOL N/A 05/28/2020   Procedure: ESOPHAGOGASTRODUODENOSCOPY (EGD) WITH PROPOFOL;  Surgeon: Eloise Harman, DO;  [facility];  Service: Endoscopy;  Laterality: N/A;  2:30pm  . EUS  09/08/2012   Dr. Ardis Hughs: CBD dilated but no stones. Query secondary to Sphincter of Oddi stenosis, ?dysfunction, but clinically without symptoms  . HARDWARE REMOVAL Right 02/12/2014   Procedure: HARDWARE REMOVAL;  Surgeon: Jolyn Nap, MD;  Location: [place];  Service: Orthopedics;  Laterality: Right;  . KNEE ARTHROSCOPY    . Neurostimulator implant    . POLYPECTOMY  12/05/2019   Procedure: POLYPECTOMY;  Surgeon: Danie Binder, MD;  [facility];  Service: Endoscopy;;  . right ring finger    . spinal stenosis, had screws put in neck  04/11/2009   DR KRITZER  . TONSILLECTOMY    . ULNA OSTEOTOMY Right 02/12/2014   Procedure: RIGHT ULNAR SHORTENING AND OSTEOTOMY ;  Surgeon: Jolyn Nap, MD;  Location: [place];  Service: Orthopedics;  Laterality: Right;  . ULNAR NERVE TRANSPOSITION    . UPPER GASTROINTESTINAL ENDOSCOPY  2008 SLF ABD PAIN  WEIGHT LOSS d100 v8 phen 12.5   NL  . WRIST SURGERY Right jan 2015   Dr. Edmonia Lynch    Family Psychiatric History: see below  Family History:  Family History  Problem Relation Age of Onset  . Bladder Cancer Mother        rare form   . Cancer Sister        Metastatic abdominal  . Emotional abuse Sister   . Stroke Sister   . Emotional abuse Sister   . Heart failure Father   . Stroke Father   . Alcohol abuse Father   . Dementia Paternal Aunt   . Alcohol abuse Paternal Uncle   . Dementia Paternal Uncle   . Dementia Paternal Grandmother   . Stroke Paternal Grandmother   . ADD / ADHD Cousin   . Bipolar disorder  Cousin   . Anxiety disorder Cousin   . Depression Cousin        Committed suicide  . Alcohol abuse Cousin   . OCD Cousin   . Paranoid behavior Cousin   . Brain cancer Maternal Grandfather   . Heart defect Other        FAMILY HX  . Colon cancer Maternal Uncle   . Colon cancer Cousin   . Colon polyps Neg Hx   . Drug abuse Neg Hx   . Schizophrenia Neg Hx   . Seizures Neg Hx   . Sexual abuse Neg Hx   . Physical abuse Neg Hx     Social History:  Social History   Socioeconomic History  . Marital status: Divorced    Spouse name: Not on file  . Number of children: Not on file  . Years of education: Not on file  . Highest education level: Not on file  Occupational History  . Not on file  Tobacco Use  . Smoking status: Former Smoker    Packs/day: 1.50    Years: 8.00    Pack years: 12.00    Types: Cigarettes    Quit date: 08/13/1986    Years since quitting: 33.9  . Smokeless tobacco: Never Used  . Tobacco comment: quit 20 + yrs ago  Vaping Use  . Vaping Use: Never used  Substance and Sexual Activity  . Alcohol use: Not Currently    Alcohol/week: 0.0 standard drinks    Comment: Used to drink heavily- went to treatment in May 2020.   Marland Kitchen Drug use: Not Currently    Comment: pain medications, klonopin- none in the last 17 months. Currently in [place]  . Sexual  activity: Never  Other Topics Concern  . Not on file  Social History Narrative  . Not on file   Social Determinants of Health   Financial Resource Strain: Low Risk   . Difficulty of Paying Living Expenses: Not hard at all  Food Insecurity: No Food Insecurity  . Worried About Charity fundraiser in the Last Year: Never true  . Ran Out of Food in the Last Year: Never true  Transportation Needs: No Transportation Needs  . Lack of Transportation (Medical): No  . Lack of Transportation (Non-Medical): No  Physical Activity: Insufficiently Active  . Days of Exercise per Week: 3 days  . Minutes of Exercise per Session: 30 min  Stress: No Stress Concern Present  . Feeling of Stress : Only a little  Social Connections: Moderately Integrated  . Frequency of Communication with Friends and Family: More than three times a week  . Frequency of Social Gatherings with Friends and Family: More than three times a week  . Attends Religious Services: More than 4 times per year  . Active Member of Clubs or Organizations: Yes  . Attends Archivist Meetings: More than 4 times per year  . Marital Status: Divorced    Allergies:  Allergies  Allergen Reactions  . Levofloxacin Nausea Only and Other (See Comments)    "Creepy" feeling, skin didn't feel right, sort of itchy.  . Ciprofloxacin     Sweating  . Sulfa Antibiotics     Metabolic Disorder Labs: Lab Results  Component Value Date   HGBA1C 5.3 06/04/2020   MPG 111.15 12/30/2017   MPG 111 10/22/2016   No results found for: PROLACTIN Lab Results  Component Value Date   CHOL 211 (H) 06/04/2020   TRIG 70 06/04/2020  HDL 55 06/04/2020   CHOLHDL 3.8 06/04/2020   VLDL 27 10/22/2016   LDLCALC 140 (H) 06/04/2020   LDLCALC 129 (H) 08/16/2019   Lab Results  Component Value Date   TSH 2.01 05/03/2019   TSH 2.056 12/30/2017    Therapeutic Level Labs: No results found for: LITHIUM No results found for: VALPROATE No components  found for:  CBMZ  Current Medications: Current Outpatient Medications  Medication Sig Dispense Refill  . acetaminophen (TYLENOL) 500 MG tablet Take 1,000 mg by mouth every 8 (eight) hours as needed for moderate pain.     . Ascorbic Acid (VITAMIN C WITH ROSE HIPS) 500 MG tablet Take 500 mg by mouth daily.     Marland Kitchen aspirin EC 81 MG tablet Take 81 mg by mouth daily.    Marland Kitchen atorvastatin (LIPITOR) 10 MG tablet Take 1 tablet (10 mg total) by mouth daily. 30 tablet 3  . cloNIDine (CATAPRES) 0.1 MG tablet Take 1 tablet (0.1 mg total) by mouth daily. 30 tablet 1  . linaclotide (LINZESS) 290 MCG CAPS capsule Take 1 capsule (290 mcg total) by mouth daily before breakfast. 90 capsule 3  . lisinopril (ZESTRIL) 20 MG tablet Take 1 tablet (20 mg total) by mouth daily. 30 tablet 1  . mirtazapine (REMERON) 15 MG tablet TAKE (1) TABLET BY MOUTH AT BEDTIME. 30 tablet 6  . Nutritional Supplements (KETO PO) Take 1 tablet by mouth in the morning, at noon, and at bedtime.     . pantoprazole (PROTONIX) 40 MG tablet Take 1 tablet (40 mg total) by mouth 2 (two) times daily. 60 tablet 5  . Probiotic Product (PROBIOTIC DAILY PO) Take by mouth daily.    Marland Kitchen senna (SENOKOT) 8.6 MG tablet Take 2 tablets by mouth daily.     . Turmeric 400 MG CAPS Take 800 mg by mouth daily.     . Vitamin D3 (VITAMIN D) 25 MCG tablet Take 4,000 Units by mouth daily.      No current facility-administered medications for this visit.     Musculoskeletal: Strength & Muscle Tone: within normal limits Gait & Station: normal Patient leans: N/A  Psychiatric Specialty Exam: Review of Systems  All other systems reviewed and are negative.   There were no vitals taken for this visit.There is no height or weight on file to calculate BMI.  General Appearance: NA  Eye Contact:  NA  Speech:  Clear and Coherent  Volume:  Normal  Mood:  Euthymic  Affect:  NA  Thought Process:  Goal Directed  Orientation:  Full (Time, Place, and Person)  Thought  Content: WDL   Suicidal Thoughts:  No  Homicidal Thoughts:  No  Memory:  Immediate;   Good Recent;   Good Remote;   NA  Judgement:  Good  Insight:  Fair  Psychomotor Activity:  Normal  Concentration:  Concentration: Good and Attention Span: Good  Recall:  Good  Fund of Knowledge: Good  Language: Good  Akathisia:  No  Handed:  Right  AIMS (if indicated): not done  Assets:  Communication Skills Desire for Improvement Physical Health Resilience Social Support Talents/Skills  ADL's:  Intact  Cognition: WNL  Sleep:  Good   Screenings: GAD-7     Office Visit from 06/04/2020 in [facility]  Total GAD-7 Score 5    PHQ2-9     Office Visit from 06/04/2020 in [facility] Video Visit from 03/04/2020 in [facility] Office Visit from 02/29/2020 in [facility]  Office Visit from 10/12/2019 in [facility] Office Visit from 09/14/2019 in [facility]  PHQ-2 Total Score 2 0 1 1 0  PHQ-9 Total Score 8 -- 2 7 --       Assessment and Plan: This patient is a 64 year old male with a long history of depression and alcohol abuse.  The only medication he continues to take is the mirtazapine 15 mg at bedtime and this is helping with his mood and sleep.  He will continue this regimen and return to see me in 6 months   Marc Spiller, MD 07/16/2020, 9:46 AM

## 2020-07-17 ENCOUNTER — Other Ambulatory Visit: Payer: Self-pay

## 2020-07-17 ENCOUNTER — Ambulatory Visit: Payer: Medicare HMO | Admitting: Orthopedic Surgery

## 2020-07-17 ENCOUNTER — Ambulatory Visit (INDEPENDENT_AMBULATORY_CARE_PROVIDER_SITE_OTHER): Payer: Medicare HMO | Admitting: Family Medicine

## 2020-07-17 ENCOUNTER — Encounter: Payer: Self-pay | Admitting: Family Medicine

## 2020-07-17 VITALS — BP 120/74 | HR 60 | Temp 96.9°F | Ht 70.0 in | Wt 138.0 lb

## 2020-07-17 DIAGNOSIS — F5104 Psychophysiologic insomnia: Secondary | ICD-10-CM

## 2020-07-17 DIAGNOSIS — K219 Gastro-esophageal reflux disease without esophagitis: Secondary | ICD-10-CM

## 2020-07-17 DIAGNOSIS — I10 Essential (primary) hypertension: Secondary | ICD-10-CM

## 2020-07-17 NOTE — Assessment & Plan Note (Signed)
He continues to have sleep trouble.  He reports this is nothing new for him.  He has been working with Dr. Harrington Challenger but he has stopped some of his medications because he did not like the feelings that they were giving him she has not started anything else back on him.  He is educated on the use of over-the-counter medications safe use of them.  And to move his clonidine to maybe at bedtime to see if that would be helpful for him sleeping. Would refrain from prescribing a sleep aid at this time.

## 2020-07-17 NOTE — Patient Instructions (Signed)
  I appreciate the opportunity to provide you with care for your health and wellness. Today we discussed: blood pressure   Follow up: 3 months with Legrand Como or Dr Posey Pronto for BP check in  No labs or referrals today  Continue medications as discussed.  You can try a lower dose of Melatonin for sleep.  Move Clonidine to bedtime- that might also help you rest a little more.  Keep up the great work with diet and lifestyle changes.  Please continue to practice social distancing to keep you, your family, and our community safe.  If you must go out, please wear a mask and practice good handwashing.  It was a pleasure to see you and I look forward to continuing to work together on your health and well-being. Please do not hesitate to call the office if you need care or have questions about your care.  Have a wonderful day. With Gratitude, Cherly Beach, DNP, AGNP-BC

## 2020-07-17 NOTE — Progress Notes (Signed)
Subjective:  Patient ID: Marc [name], male    DOB: 1955-10-31  Age: 64 y.o. MRN: [id number]  CC:  Chief Complaint  Patient presents with  . Hypertension    f/u      HPI  HPI  Marc [name] is a 64 year old male patient of Dr. Griffin Dakin who presents today for follow-up on blood pressure. Throughout the year he has wanted to adjust his clonidine medication for blood pressure control.  Most recently he wanted to wean off of the clonidine in October this year.  So we reduced to once daily for taper for 3 weeks now increase his ACE inhibitor to 20 mg with follow-up after his annual visit in October.  Today blood pressure is well controlled he denies having any chest pain, leg swelling, headaches, cough, shortness of breath, dizziness or vision changes.  He does endorse having a rapid heart rate at times.  But does suffer from anxiety and depression.  Of which she is seeing a therapist for.  Of note he has lost a little bit more weight.  He reports that he has changed his diet secondary to having really bad uncontrolled heartburn.  He is also been walking regularly and trying to drink more water.  He reports that he just feels like he has a burning sensation in his esophagus he does go for some GI testing tomorrow morning.  BMI still in the normal range but on the lower end now.  He is encouraged to increase his protein either by protein shake or by eating soft protein foods that do not cause him heartburn.  Additionally he reports that he is no longer sleeping well.  He says that he stopped taking his Seroquel.  He spoke with Dr. Harrington Challenger but she did not see anything about restarting it or starting him on anything that would help with sleep.  He has tried melatonin but reports that that makes him more sleepy the next day with a hangover-like feeling.  He does not like it.  He continues to take Remeron at night.  He takes his clonidine at 5 PM instead of at bedtime.  But is willing to adjust it to  bedtime to see if that is helpful.  He reports that he will go to sleep for 3 to 4 hours and then wake up in the sometimes feel like his heart is racing.  He reports that he has always been a poor sleeper throughout his life even as a kid.  Today patient denies signs and symptoms of COVID 19 infection including fever, chills, cough, shortness of breath, and headache. Past Medical, Surgical, Social History, Allergies, and Medications have been Reviewed.   Past Medical History:  Diagnosis Date  . Acute encephalopathy 12/29/2017   Hospitalized 5/22 to 5/23 with acute encephalopathy, unclear etiology  . Acute gout involving toe of right foot 09/14/2019  . Allergy   . Anxiety   . Arthritis   . Back problem   . Chest wall pain 03/28/2017  . Chronic hepatitis C without hepatic coma ([place]) 01/04/2009   Qualifier: Diagnosis of  By: Westly Pam. 2008: CT A/P NO CIRRHOSIS AUG 2013 MRCP- DILATED CBD/NO CIRRHOSIS DEC 2013 AST 99-102  ALT 145-156 PLT 147 HB 13.4 ALB 4.4-4.7 T BIL 0.7   . Depression   . Dilation of biliary tract 07/14/2012   EUS JAN 2014 BENIGN CBD DILATION   . Epigastric pain 07/14/2012  . GERD (gastroesophageal reflux disease)   .  Gout   . HCV (hepatitis C virus) 06/16/2015  . Hepatitis C 1979  . Hepatitis C reactive    LIVER BX 2008-CHRONIC ACTIVE HEPATITIS  . HTN (hypertension)   . Hx of adenomatous colonic polyps 2008   due for surveillance 2017  . Hypercholesterolemia   . IBS (irritable bowel syndrome)   . Irritable bowel syndrome 01/04/2009   Qualifier: Diagnosis of  By: Westly Pam.   . Knee pain   . Left forearm pain 08/15/2017  . Leukopenia 11/06/2015  . Narcotic addiction [facility]) 06/10/2011   [facility]- Oct 2012   . Normal cardiac stress test 05/25/2011   [facility]  . Normal echocardiogram 05/25/2011   EF 55%, mild TR  . RUQ pain 07/14/2012  . Substance abuse ([place])    narcotic addiction  . Thrombocytopenia ([place])  11/06/2015  . Trigger middle finger of left hand 06/20/2012    Current Meds  Medication Sig  . acetaminophen (TYLENOL) 500 MG tablet Take 1,000 mg by mouth every 8 (eight) hours as needed for moderate pain.   . Ascorbic Acid (VITAMIN C WITH ROSE HIPS) 500 MG tablet Take 500 mg by mouth daily.   Marland Kitchen aspirin EC 81 MG tablet Take 81 mg by mouth daily.  Marland Kitchen atorvastatin (LIPITOR) 10 MG tablet Take 1 tablet (10 mg total) by mouth daily.  . cloNIDine (CATAPRES) 0.1 MG tablet Take 1 tablet (0.1 mg total) by mouth daily.  Marland Kitchen linaclotide (LINZESS) 290 MCG CAPS capsule Take 1 capsule (290 mcg total) by mouth daily before breakfast.  . lisinopril (ZESTRIL) 20 MG tablet Take 1 tablet (20 mg total) by mouth daily.  . mirtazapine (REMERON) 15 MG tablet TAKE (1) TABLET BY MOUTH AT BEDTIME.  . Nutritional Supplements (KETO PO) Take 1 tablet by mouth in the morning, at noon, and at bedtime.   . pantoprazole (PROTONIX) 40 MG tablet Take 1 tablet (40 mg total) by mouth 2 (two) times daily.  . Probiotic Product (PROBIOTIC DAILY PO) Take by mouth daily.  Marland Kitchen senna (SENOKOT) 8.6 MG tablet Take 2 tablets by mouth daily.   . Turmeric 400 MG CAPS Take 800 mg by mouth daily.   . Vitamin D3 (VITAMIN D) 25 MCG tablet Take 4,000 Units by mouth daily.     ROS:  Review of Systems  Constitutional: Negative.   HENT: Negative.   Eyes: Negative.   Respiratory: Negative.   Cardiovascular: Negative.   Gastrointestinal: Positive for heartburn.  Genitourinary: Negative.   Musculoskeletal: Negative.   Skin: Negative.   Neurological: Negative.   Endo/Heme/Allergies: Negative.   Psychiatric/Behavioral: The patient has insomnia.      Objective:   Today's Vitals: BP 120/74 (BP Location: Right Arm, Patient Position: Sitting, Cuff Size: Normal)   Pulse 60   Temp (!) 96.9 F (36.1 C) (Temporal)   Ht 5\' 10"  (1.778 m)   Wt 138 lb (62.6 kg)   SpO2 99%   BMI 19.80 kg/m  Vitals with BMI 07/17/2020 07/12/2020 06/19/2020  Height  5\' 10"  5\' 10"  5\' 10"   Weight 138 lbs 139 lbs 10 oz 142 lbs  BMI 19.8 03.83 33.83  Systolic 291 916 606  Diastolic 74 78 84  Pulse 60 68 -  Some encounter information is confidential and restricted. Go to Review Flowsheets activity to see all data.     Physical Exam Vitals and nursing note reviewed.  Constitutional:      Appearance: Normal appearance. He is well-developed,  well-groomed and normal weight.  HENT:     Head: Normocephalic and atraumatic.     Right Ear: External ear normal.     Left Ear: External ear normal.     Mouth/Throat:     Comments: Mask in place  Eyes:     General:        Right eye: No discharge.        Left eye: No discharge.     Conjunctiva/sclera: Conjunctivae normal.  Cardiovascular:     Rate and Rhythm: Normal rate and regular rhythm.     Pulses: Normal pulses.     Heart sounds: Normal heart sounds.  Pulmonary:     Effort: Pulmonary effort is normal.     Breath sounds: Normal breath sounds.  Musculoskeletal:        General: Normal range of motion.     Cervical back: Normal range of motion and neck supple.  Skin:    General: Skin is warm.  Neurological:     General: No focal deficit present.     Mental Status: He is alert and oriented to person, place, and time.  Psychiatric:        Attention and Perception: Attention normal.        Mood and Affect: Mood normal.        Speech: Speech normal.        Behavior: Behavior normal. Behavior is cooperative.        Thought Content: Thought content normal.        Cognition and Memory: Cognition normal.        Judgment: Judgment normal.     Assessment   1. Essential hypertension   2. Psychophysiological insomnia   3. Gastroesophageal reflux disease, unspecified whether esophagitis present     Tests ordered No orders of the defined types were placed in this encounter.    Plan: Please see assessment and plan per problem list above.   No orders of the defined types were placed in this  encounter.   Patient to follow-up in 10/15/2020  Note: This dictation was prepared with Dragon dictation along with smaller phrase technology. Similar sounding words can be transcribed inadequately or may not be corrected upon review. Any transcriptional errors that result from this process are unintentional.      Perlie Mayo, NP

## 2020-07-17 NOTE — Assessment & Plan Note (Signed)
He is on Protonix now.  Has follow-up EGD I believe in the morning.  Is being closely work with with GI.  I encouraged him to continue his diet change but to make sure that he is getting enough protein and nutrients in his diet as well.  As his weight has changed.

## 2020-07-17 NOTE — Assessment & Plan Note (Signed)
He would like to stay on the 0.1 mg of clonidine.  ACE inhibitor 20 mg tolerating well.  I think secondary to his lifestyle changes he is improved his blood pressure tremendously and does not need the clonidine any longer.  We will keep it for now and have him follow-up in 3 months to see if there have been any changes.  I have encouraged him to not abruptly stop the medication and he would be a taper since his been on it for so long.  He reports understanding this.  I have encouraged him to continue his lifestyle and diet changes.

## 2020-07-18 ENCOUNTER — Telehealth: Payer: Self-pay | Admitting: Gastroenterology

## 2020-07-18 ENCOUNTER — Other Ambulatory Visit: Payer: Self-pay | Admitting: Gastroenterology

## 2020-07-18 ENCOUNTER — Ambulatory Visit (HOSPITAL_COMMUNITY)
Admission: RE | Admit: 2020-07-18 | Discharge: 2020-07-18 | Disposition: A | Discharge status: Home / Self Care | Payer: Medicare HMO | Source: Ambulatory Visit | Attending: Gastroenterology | Admitting: Gastroenterology

## 2020-07-18 DIAGNOSIS — R1310 Dysphagia, unspecified: Secondary | ICD-10-CM | POA: Diagnosis not present

## 2020-07-18 DIAGNOSIS — K219 Gastro-esophageal reflux disease without esophagitis: Secondary | ICD-10-CM

## 2020-07-18 MED ORDER — ESOMEPRAZOLE MAGNESIUM 40 MG PO CPDR: 40.0000 mg | DELAYED_RELEASE_CAPSULE | Freq: Two times a day (BID) | ORAL | 1 refills | Status: DC

## 2020-07-18 NOTE — Assessment & Plan Note (Addendum)
Chronic.  Improved with Linzess 290 mcg but not adequately controlled.  Previously tried adding MiraLAX to [facility] but patient felt this was not helpful.  He is currently taking 4 senna tablets daily along with Linzess and is having BMs daily to every 2-3 days with BMs on the harder side and incomplete.  We were trying to complete a prior authorization for Amitiza, but it looks like this was not followed up on.  He has no alarm symptoms.  He does need a complete colonoscopy in the near future as he had a poor prep in April 2021.  Patient prefers to hold off on colonoscopy for now.  Plan: I am asking nursing staff to look into Amitiza again to determine what happened with prior authorization that was submitted. Continue Linzess 290 mcg daily for now. Start Colace up to 3 capsules daily. Use senna as needed. Follow-up in 2 months.

## 2020-07-18 NOTE — Assessment & Plan Note (Addendum)
Chronic history of GERD.  Recent EGD 05/28/2020 with benign-appearing esophageal stenosis s/p dilation, gastritis s/p biopsy, normal examined duodenum.  Pathology with slight chronic inflammation, negative for H. pylori or other significant findings.  GERD continue to be uncontrolled with symptoms daily despite Protonix 40 mg twice daily and significant dietary changes.    Plan: Stop Protonix and trial Dexilant 60 mg daily.  Samples provided.  Requested progress report in 2 weeks. Continue with current dietary changes.

## 2020-07-18 NOTE — Assessment & Plan Note (Signed)
Patient had new onset dysphagia in August 2021 in the setting of uncontrolled GERD.  EGD 05/28/2020 with benign-appearing esophageal stenosis s/p dilation.  Patient reports improvement in dysphagia but continues to feel pills and food items are getting hung in the upper throat or mid chest area.  States he will bring items back up and swallow again, and items pass.  Continues to have daily GERD symptoms despite Protonix 40 mg twice daily and making significant dietary changes.  Query whether patient may have esophageal motility disorder.  Dysphagia may also be influenced by uncontrolled GERD.  Plan: Stop Protonix and trial Dexilant 60 mg daily.  Samples provided.  Requested progress report in 2 weeks. BPE to evaluate ongoing dysphagia. Further recommendations to follow.

## 2020-07-18 NOTE — Telephone Encounter (Signed)
Marc Putt, do we have any updates on getting Amitiza approved?

## 2020-07-19 ENCOUNTER — Telehealth: Payer: Self-pay

## 2020-07-19 NOTE — Telephone Encounter (Signed)
Left a detailed message of approval for pt.

## 2020-07-19 NOTE — Telephone Encounter (Signed)
Noted  

## 2020-07-19 NOTE — Progress Notes (Signed)
Cc'ed to pcp °

## 2020-07-19 NOTE — Telephone Encounter (Signed)
OPENED I ERROR. SENT TO [facility]

## 2020-07-19 NOTE — Telephone Encounter (Signed)
Received approval letter today 07/19/20 of Amitiza 24 mcg 08-11-19 - 07-30-2021. Pts pharmacy was notified of approval.

## 2020-07-30 ENCOUNTER — Other Ambulatory Visit: Payer: Self-pay | Admitting: Family Medicine

## 2020-07-30 DIAGNOSIS — I10 Essential (primary) hypertension: Secondary | ICD-10-CM

## 2020-08-07 NOTE — Patient Instructions (Addendum)
Marc [name]  08/07/2020     @PREFPERIOPPHARMACY @   Your procedure is scheduled on 08/13/2020.  Report to [name] at 6:15 A.M.  Call this number if you have problems the morning of surgery:  [phone]   Remember:  Do not eat or drink after midnight.     Take these medicines the morning of surgery with A SIP OF WATER : Catapress and Nexium    Do not wear jewelry, make-up or nail polish.  Do not wear lotions, powders, or perfumes, or deodorant.  Do not shave 48 hours prior to surgery.  Men may shave face and neck.  Do not bring valuables to the hospital.  [facility] is not responsible for any belongings or valuables.  Contacts, dentures or bridgework may not be worn into surgery.  Leave your suitcase in the car.  After surgery it may be brought to your room.  For patients admitted to the hospital, discharge time will be determined by your treatment team.  Patients discharged the day of surgery will not be allowed to drive home.   Name and phone number of your driver:   family Special instructions:  n/a  Please read over the following fact sheets that you were given. Care and Recovery After Surgery  Trigger Finger  Trigger finger, also called stenosing tenosynovitis,  is a condition that causes a finger to get stuck in a bent position. Each finger has a tendon, which is a tough, cord-like tissue that connects muscle to bone, and each tendon passes through a tunnel of tissue called a tendon sheath. To move your finger, your tendon needs to glide freely through the sheath. Trigger finger happens when the tendon or the sheath thickens, making it difficult to move your finger. Trigger finger can affect any finger or a thumb. It may affect more than one finger. Mild cases may clear up with rest and medicine. Severe cases require more treatment. What are the causes? Trigger finger is caused by a thickened finger tendon or tendon sheath. The cause of this thickening is not  known. What increases the risk? The following factors may make you more likely to develop this condition:  Doing activities that require a strong grip.  Having rheumatoid arthritis, gout, or diabetes.  Being 27-30 years old.  Being male. What are the signs or symptoms? Symptoms of this condition include:  Pain when bending or straightening your finger.  Tenderness or swelling where your finger attaches to the palm of your hand.  A lump in the palm of your hand or on the inside of your finger.  Hearing a noise like a pop or a snap when you try to straighten your finger.  Feeling a catching or locking sensation when you try to straighten your finger.  Being unable to straighten your finger. How is this diagnosed? This condition is diagnosed based on your symptoms and a physical exam. How is this treated? This condition may be treated by:  Resting your finger and avoiding activities that make symptoms worse.  Wearing a finger splint to keep your finger extended.  Taking NSAIDs, such as ibuprofen, to relieve pain and swelling.  Doing gentle exercises to stretch the finger as told by your health care provider.  Having medicine that reduces swelling and inflammation (steroids) injected into the tendon sheath. Injections may need to be repeated.  Having surgery to open the tendon sheath. This may be done if other treatments do not work and you cannot straighten  your finger. You may need physical therapy after surgery. Follow these instructions at home: If you have a splint:  Wear the splint as told by your health care provider. Remove it only as told by your health care provider.  Loosen it if your fingers tingle, become numb, or turn cold and blue.  Keep it clean.  If the splint is not waterproof: ? Do not let it get wet. ? Cover it with a watertight covering when you take a bath or shower. Managing pain, stiffness, and swelling     If directed, apply heat to the  affected area as often as told by your health care provider. Use the heat source that your health care provider recommends, such as a moist heat pack or a heating pad.  Place a towel between your skin and the heat source.  Leave the heat on for 20-30 minutes.  Remove the heat if your skin turns bright red. This is especially important if you are unable to feel pain, heat, or cold. You may have a greater risk of getting burned. If directed, put ice on the painful area. To do this:  If you have a removable splint, remove it as told by your health care provider.  Put ice in a plastic bag.  Place a towel between your skin and the bag or between your splint and the bag.  Leave the ice on for 20 minutes, 2-3 times a day.  Activity  Rest your finger as told by your health care provider. Avoid activities that make the pain worse.  Return to your normal activities as told by your health care provider. Ask your health care provider what activities are safe for you.  Do exercises as told by your health care provider.  Ask your health care provider when it is safe to drive if you have a splint on your hand. General instructions  Take over-the-counter and prescription medicines only as told by your health care provider.  Keep all follow-up visits as told by your health care provider. This is important. Contact a health care provider if:  Your symptoms are not improving with home care. Summary  Trigger finger, also called stenosing tenosynovitis, causes your finger to get stuck in a bent position. This can make it difficult and painful to straighten your finger.  This condition develops when a finger tendon or tendon sheath thickens.  Treatment may include resting your finger, wearing a splint, and taking medicines.  In severe cases, surgery to open the tendon sheath may be needed. This information is not intended to replace advice given to you by your health care provider. Make sure you  discuss any questions you have with your health care provider. Document Revised: 12/12/2018 Document Reviewed: 12/12/2018 Elsevier Patient Education  2020 [place] Anesthesia is a term that refers to techniques, procedures, and medicines that help a person stay safe and comfortable during a medical procedure. Monitored anesthesia care, or sedation, is one type of anesthesia. Your anesthesia specialist may recommend sedation if you will be having a procedure that does not require you to be unconscious, such as:  Cataract surgery.  A dental procedure.  A biopsy.  A colonoscopy. During the procedure, you may receive a medicine to help you relax (sedative). There are three levels of sedation:  Mild sedation. At this level, you may feel awake and relaxed. You will be able to follow directions.  Moderate sedation. At this level, you will be sleepy. You  may not remember the procedure.  Deep sedation. At this level, you will be asleep. You will not remember the procedure. The more medicine you are given, the deeper your level of sedation will be. Depending on how you respond to the procedure, the anesthesia specialist may change your level of sedation or the type of anesthesia to fit your needs. An anesthesia specialist will monitor you closely during the procedure. Let your health care provider know about:  Any allergies you have.  All medicines you are taking, including vitamins, herbs, eye drops, creams, and over-the-counter medicines.  Any use of steroids (by mouth or as a cream).  Any problems you or family members have had with sedatives and anesthetic medicines.  Any blood disorders you have.  Any surgeries you have had.  Any medical conditions you have, such as sleep apnea.  Whether you are pregnant or may be pregnant.  Any use of cigarettes, alcohol, or street drugs. What are the risks? Generally, this is a safe procedure. However, problems  may occur, including:  Getting too much medicine (oversedation).  Nausea.  Allergic reaction to medicines.  Trouble breathing. If this happens, a breathing tube may be used to help with breathing. It will be removed when you are awake and breathing on your own.  Heart trouble.  Lung trouble. Before the procedure Staying hydrated Follow instructions from your health care provider about hydration, which may include:  Up to 2 hours before the procedure - you may continue to drink clear liquids, such as water, clear fruit juice, black coffee, and plain tea. Eating and drinking restrictions Follow instructions from your health care provider about eating and drinking, which may include:  8 hours before the procedure - stop eating heavy meals or foods such as meat, fried foods, or fatty foods.  6 hours before the procedure - stop eating light meals or foods, such as toast or cereal.  6 hours before the procedure - stop drinking milk or drinks that contain milk.  2 hours before the procedure - stop drinking clear liquids. Medicines Ask your health care provider about:  Changing or stopping your regular medicines. This is especially important if you are taking diabetes medicines or blood thinners.  Taking medicines such as aspirin and ibuprofen. These medicines can thin your blood. Do not take these medicines before your procedure if your health care provider instructs you not to. Tests and exams  You will have a physical exam.  You may have blood tests done to show: ? How well your kidneys and liver are working. ? How well your blood can clot. General instructions  Plan to have someone take you home from the hospital or clinic.  If you will be going home right after the procedure, plan to have someone with you for 24 hours.  What happens during the procedure?  Your blood pressure, heart rate, breathing, level of pain and overall condition will be monitored.  An IV tube will  be inserted into one of your veins.  Your anesthesia specialist will give you medicines as needed to keep you comfortable during the procedure. This may mean changing the level of sedation.  The procedure will be performed. After the procedure  Your blood pressure, heart rate, breathing rate, and blood oxygen level will be monitored until the medicines you were given have worn off.  Do not drive for 24 hours if you received a sedative.  You may: ? Feel sleepy, clumsy, or nauseous. ? Feel forgetful about  what happened after the procedure. ? Have a sore throat if you had a breathing tube during the procedure. ? Vomit. This information is not intended to replace advice given to you by your health care provider. Make sure you discuss any questions you have with your health care provider. Document Revised: 07/09/2017 Document Reviewed: 11/17/2015 Elsevier Patient Education  [place].

## 2020-08-08 ENCOUNTER — Telehealth: Payer: Self-pay

## 2020-08-08 ENCOUNTER — Other Ambulatory Visit: Payer: Self-pay | Admitting: *Deleted

## 2020-08-08 DIAGNOSIS — I10 Essential (primary) hypertension: Secondary | ICD-10-CM

## 2020-08-08 MED ORDER — LISINOPRIL 20 MG PO TABS: 20.0000 mg | ORAL_TABLET | Freq: Every day | ORAL | 1 refills | Status: DC

## 2020-08-08 NOTE — Telephone Encounter (Signed)
Pt medication sent to pharmacy  

## 2020-08-08 NOTE — Telephone Encounter (Signed)
Med refill Lisinopril 20 mg completely out of this medicine.  [facility]

## 2020-08-12 ENCOUNTER — Encounter (HOSPITAL_COMMUNITY)
Admission: RE | Admit: 2020-08-12 | Discharge: 2020-08-12 | Disposition: A | Discharge status: Home / Self Care | Payer: Medicare HMO | Source: Ambulatory Visit | Attending: Orthopedic Surgery | Admitting: Orthopedic Surgery

## 2020-08-12 ENCOUNTER — Telehealth: Payer: Self-pay | Admitting: Orthopedic Surgery

## 2020-08-12 ENCOUNTER — Other Ambulatory Visit (HOSPITAL_COMMUNITY): Payer: Medicare HMO | Attending: Orthopedic Surgery

## 2020-08-12 ENCOUNTER — Encounter (HOSPITAL_COMMUNITY): Payer: Self-pay

## 2020-08-12 NOTE — H&P (Signed)
Chief Complaint  Patient presents with  . 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  He only got relief of locking and catching for a week and would like to proceed with surgery.   Review of systems negative for numbness tingling nausea vomiting chest pain shortness of breath       Past Medical History:  Diagnosis Date  . Acute encephalopathy 12/29/2017    Hospitalized 5/22 to 5/23 with acute encephalopathy, unclear etiology  . Acute gout involving toe of right foot 09/14/2019  . Allergy    . Anxiety    . Arthritis    . Back problem    . Chest wall pain 03/28/2017  . Chronic hepatitis C without hepatic coma ([place]) 01/04/2009    Qualifier: Diagnosis of  By: Westly Pam. 2008: CT A/P NO CIRRHOSIS AUG 2013 MRCP- DILATED CBD/NO CIRRHOSIS DEC 2013 AST 99-102  ALT 145-156 PLT 147 HB 13.4 ALB 4.4-4.7 T BIL 0.7   . Depression    . Dilation of biliary tract 07/14/2012    EUS JAN 2014 BENIGN CBD DILATION   . Epigastric pain 07/14/2012  . GERD (gastroesophageal reflux disease)    . Gout    . HCV (hepatitis C virus) 06/16/2015  . Hepatitis C 1979  . Hepatitis C reactive      LIVER BX 2008-CHRONIC ACTIVE HEPATITIS  . HTN (hypertension)    . Hx of adenomatous colonic polyps 2008    due for surveillance 2017  . Hypercholesterolemia    . IBS (irritable bowel syndrome)    . Irritable bowel syndrome 01/04/2009    Qualifier: Diagnosis of  By: Westly Pam.   . Knee pain    . Left forearm pain 08/15/2017  . Leukopenia 11/06/2015  . Narcotic addiction [facility]) 06/10/2011    [facility]- Oct 2012   . Normal cardiac stress test 05/25/2011    [facility] [place]  . Normal echocardiogram 05/25/2011    EF 55%, mild TR  . RUQ pain 07/14/2012  . Substance abuse ([place])      narcotic addiction  . Thrombocytopenia ([place]) 11/06/2015  . Trigger middle finger of left  hand 06/20/2012         Past Surgical History:  Procedure Laterality Date  . APPENDECTOMY      . BALLOON DILATION N/A 05/28/2020    Procedure: BALLOON DILATION;  Surgeon: Eloise Harman, DO;  [facility];  Service: Endoscopy;  Laterality: N/A;  . BIOPSY   05/28/2020    Procedure: BIOPSY;  Surgeon: Eloise Harman, DO;  [facility];  Service: Endoscopy;;  . CARPAL TUNNEL RELEASE        rt  . COLONOSCOPY   2008 [facility] ARS D100 V8 PHEN 12.5    2 SIMPLE ADENOMAS (< 1 CM)  . COLONOSCOPY WITH PROPOFOL N/A 12/05/2019    TI normal, 3 mm polyp in ascending colon, external and internal hemorrhoids. Polyp removed but not retrieved. Poor prep  . ESOPHAGOGASTRODUODENOSCOPY   04/26/2012    Dr. Oneida Alar: Non-erosive gastritis (inflammation) was found in the gastric antrum but no H.pylori; multiple biopsies (duodenal bx negative for Celiac)/ The mucosa of the esophagus appeared normal  . ESOPHAGOGASTRODUODENOSCOPY (EGD) WITH PROPOFOL N/A 12/05/2019    mild gastritis, duodenitis due to aspirin/NSAIDs.   Marland Kitchen ESOPHAGOGASTRODUODENOSCOPY (EGD) WITH  PROPOFOL N/A 05/28/2020    Procedure: ESOPHAGOGASTRODUODENOSCOPY (EGD) WITH PROPOFOL;  Surgeon: Lanelle Bal, DO;  [facility];  Service: Endoscopy;  Laterality: N/A;  2:30pm  . EUS   09/08/2012    Dr. Christella Hartigan: CBD dilated but no stones. Query secondary to Sphincter of Oddi stenosis, ?dysfunction, but clinically without symptoms  . HARDWARE REMOVAL Right 02/12/2014    Procedure: HARDWARE REMOVAL;  Surgeon: Jodi Marble, MD;  Location: [facility];  Service: Orthopedics;  Laterality: Right;  . KNEE ARTHROSCOPY      . Neurostimulator implant      . POLYPECTOMY   12/05/2019    Procedure: POLYPECTOMY;  Surgeon: West Bali, MD;  [facility];  Service: Endoscopy;;  . right ring finger      . spinal stenosis, had screws put in neck   04/11/2009    DR KRITZER  . TONSILLECTOMY      . ULNA OSTEOTOMY Right  02/12/2014    Procedure: RIGHT ULNAR SHORTENING AND OSTEOTOMY ;  Surgeon: Jodi Marble, MD;  Location: [facility];  Service: Orthopedics;  Laterality: Right;  . ULNAR NERVE TRANSPOSITION      . UPPER GASTROINTESTINAL ENDOSCOPY   2008 SLF ABD PAIN WEIGHT LOSS d100 v8 phen 12.5    NL  . WRIST SURGERY Right jan 2015    Dr. Margarita Rana    Social History         Tobacco Use  . Smoking status: Former Smoker      Packs/day: 1.50      Years: 8.00      Pack years: 12.00      Types: Cigarettes      Quit date: 08/13/1986      Years since quitting: 33.8  . Smokeless tobacco: Never Used  . Tobacco comment: quit 20 + yrs ago  Vaping Use  . Vaping Use: Never used  Substance Use Topics  . Alcohol use: Not Currently      Alcohol/week: 0.0 standard drinks      Comment: Used to drink heavily- went to treatment in May 2020.   Marland Kitchen Drug use: Not Currently      Comment: pain medications, klonopin- none in the last 17 months. Currently in AA         Family History  Problem Relation Age of Onset  . Bladder Cancer Mother          rare form   . Cancer Sister          Metastatic abdominal  . Emotional abuse Sister    . Stroke Sister    . Emotional abuse Sister    . Heart failure Father    . Stroke Father    . Alcohol abuse Father    . Dementia Paternal Aunt    . Alcohol abuse Paternal Uncle    . Dementia Paternal Uncle    . Dementia Paternal Grandmother    . Stroke Paternal Grandmother    . ADD / ADHD Cousin    . Bipolar disorder Cousin    . Anxiety disorder Cousin    . Depression Cousin          Committed suicide  . Alcohol abuse Cousin    . OCD Cousin    . Paranoid behavior Cousin    . Brain cancer Maternal Grandfather    . Heart defect Other          FAMILY HX  . Colon cancer  Maternal Uncle    . Colon cancer Cousin    . Colon polyps Neg Hx    . Drug abuse Neg Hx    . Schizophrenia Neg Hx    . Seizures Neg Hx    . Sexual abuse Neg Hx    . Physical abuse Neg Hx         BP 126/84   Ht 5\' 10"  (1.778 m)   Wt 142 lb (64.4 kg)   BMI 20.37 kg/m    Physical Exam Constitutional:      General: He is not in acute distress.    Appearance: He is well-developed.  Cardiovascular:     Comments: No peripheral edema Skin:    General: Skin is warm and dry.  Neurological:     Mental Status: He is alert and oriented to person, place, and time.     Sensory: No sensory deficit.     Coordination: Coordination normal.     Gait: Gait normal.     Deep Tendon Reflexes: Reflexes are normal and symmetric.     Left hand alignment looks normal skin is intact Neurovascular exam is normal I could not reproduce the triggering but he did have tenderness over the A1 pulley of the long and ring finger Flexor extensor tendons were intact joints were stable without subluxation or dislocation       Encounter Diagnoses  Name Primary?  . Trigger finger, left middle finger Yes  . Trigger finger, left ring finger        Patient had an injection on 26 October recommend 30-day interval before surgery to prevent any increased risk of infection and then proceed with a left ring and long finger trigger release  9:11 AM Arther Abbott, MD

## 2020-08-12 NOTE — Telephone Encounter (Signed)
Per Gloris Manchester at [facility] day surgery - states patient cancelled tomorrow's surgery (for 08/13/20). Please call back to day surgery regarding scheduling.

## 2020-08-12 NOTE — Progress Notes (Signed)
Patient did not show up for his PAT appointment or covid testing.  Contacted patient via phone and patient states that he wants to cancel his surgery and will reschedule at a later date.

## 2020-08-13 ENCOUNTER — Ambulatory Visit (HOSPITAL_COMMUNITY): Admission: RE | Admit: 2020-08-13 | Payer: Medicare HMO | Source: Home / Self Care | Admitting: Orthopedic Surgery

## 2020-08-13 ENCOUNTER — Encounter (HOSPITAL_COMMUNITY): Admission: RE | Payer: Self-pay | Source: Home / Self Care

## 2020-08-13 SURGERY — RELEASE, A1 PULLEY, FOR TRIGGER FINGER
Anesthesia: Choice | Laterality: Left

## 2020-08-14 ENCOUNTER — Other Ambulatory Visit: Payer: Self-pay | Admitting: Gastroenterology

## 2020-08-14 ENCOUNTER — Telehealth: Payer: Self-pay

## 2020-08-14 DIAGNOSIS — K219 Gastro-esophageal reflux disease without esophagitis: Secondary | ICD-10-CM

## 2020-08-14 MED ORDER — RABEPRAZOLE SODIUM 20 MG PO TBEC
20.0000 mg | DELAYED_RELEASE_TABLET | Freq: Two times a day (BID) | ORAL | 3 refills | Status: DC
Start: 2020-08-14 — End: 2020-09-12

## 2020-08-14 NOTE — Telephone Encounter (Signed)
Spoke with pt. Pt is taking Nexium 40 mg bid. Nexium was started 07/2020. Pt feels bloating in abdomen, feeling that food is caught in his throat and ot feels acid comes up in his mouth. Pt is following a gerd diet and has d/c coffee, chocolate, citrus fruits, fried foods ect.

## 2020-08-14 NOTE — Telephone Encounter (Signed)
We can try him on a different medication for acid reflux to see if he has any improvement. We have 2 medication options left, aciphex and prevacid. I would like to try Aciphex 20 mg BID 30 minutes before breakfast and dinner. I will send Rx to his pharmacy.   I suspect we will be moving towards pursuing esophageal manometry with ph/impedance testing as discussed at his last OV due to significant GERD symptoms.   Regarding dysphagia:  He should avoid items that cause him the most trouble including crunchy/flaky items. Last time he said apples and toast caused the most trouble.  Continue to take small bites, chew thoroughly, and drink plenty of liquids throughout meals.  I will ask Dr. Marletta Lor if there is any role for additional dilation of his esophagus considering the tiny upper esophageal abnormality that was noted on the swallowing study.   Has he stopped Linzess and started Amitza 24 mcg BID for constipation?

## 2020-08-14 NOTE — Telephone Encounter (Signed)
If he is still having to strain with bowel movements, he can try adding MiraLAX 1 capful in 8 oz of water daily to his current regimen to see if this will help.

## 2020-08-14 NOTE — Telephone Encounter (Signed)
Spoke with pt. Pt was notified of recommendation and will start the Aciphex 30 mins before breakfast and dinner. Pt was advised to avoid foods that cause triggers to his dysphasia, chew food thoroughly and drink plenty of foods.   Pt is taking Amitiza 24 mcg bid as directed and having a BM qod. Pt states that he has to strain at first to get his bowels moving. Some bloating is felt. Pt feels it could be the [place].

## 2020-08-15 NOTE — Telephone Encounter (Signed)
Spoke with pt. Pt is aware that if he is having to strain with bowel movements, he can add Miralax 1 capful with 8 oz of water daily.

## 2020-08-16 ENCOUNTER — Telehealth: Payer: Self-pay | Admitting: Radiology

## 2020-08-16 NOTE — Telephone Encounter (Signed)
Left message for him to call back about RS of surgery He cancelled due to weather.

## 2020-08-26 ENCOUNTER — Encounter: Payer: Self-pay | Admitting: Nurse Practitioner

## 2020-08-26 ENCOUNTER — Telehealth (INDEPENDENT_AMBULATORY_CARE_PROVIDER_SITE_OTHER): Payer: Medicare HMO | Admitting: Nurse Practitioner

## 2020-08-26 ENCOUNTER — Other Ambulatory Visit: Payer: Self-pay

## 2020-08-26 DIAGNOSIS — K219 Gastro-esophageal reflux disease without esophagitis: Secondary | ICD-10-CM | POA: Diagnosis not present

## 2020-08-26 MED ORDER — ALUM & MAG HYDROXIDE-SIMETH 200-200-20 MG/5ML PO SUSP: 15.0000 mL | Freq: Four times a day (QID) | ORAL | Status: DC | PRN

## 2020-08-26 NOTE — Progress Notes (Signed)
Acute Office Visit  Subjective:    Patient ID: Marc [name], male    DOB: 07/16/1956, 65 y.o.   MRN: [id number]  Chief Complaint  Patient presents with  . Sore Throat  . Gastroesophageal Reflux    X 1 year     HPI Patient is in today for sick visit.   He feels like his throat is raw.  He has a salty, syrupy mucus at the back of his throat.  He states he has oral lichen planus and has seen GI.  He saw GI recently and they switched him off of esomeprazole, and started him on rabeprazole.  He is also taking a probiotic.  Past Medical History:  Diagnosis Date  . Acute encephalopathy 12/29/2017   Hospitalized 5/22 to 5/23 with acute encephalopathy, unclear etiology  . Acute gout involving toe of right foot 09/14/2019  . Allergy   . Anxiety   . Arthritis   . Back problem   . Chest wall pain 03/28/2017  . Chronic hepatitis C without hepatic coma ([place]) 01/04/2009   Qualifier: Diagnosis of  By: Westly Pam. 2008: CT A/P NO CIRRHOSIS AUG 2013 MRCP- DILATED CBD/NO CIRRHOSIS DEC 2013 AST 99-102  ALT 145-156 PLT 147 HB 13.4 ALB 4.4-4.7 T BIL 0.7   . Depression   . Dilation of biliary tract 07/14/2012   EUS JAN 2014 BENIGN CBD DILATION   . Epigastric pain 07/14/2012  . GERD (gastroesophageal reflux disease)   . Gout   . HCV (hepatitis C virus) 06/16/2015  . Hepatitis C 1979  . Hepatitis C reactive    LIVER BX 2008-CHRONIC ACTIVE HEPATITIS  . HTN (hypertension)   . Hx of adenomatous colonic polyps 2008   due for surveillance 2017  . Hypercholesterolemia   . IBS (irritable bowel syndrome)   . Irritable bowel syndrome 01/04/2009   Qualifier: Diagnosis of  By: Westly Pam.   . Knee pain   . Left forearm pain 08/15/2017  . Leukopenia 11/06/2015  . Narcotic addiction [facility]) 06/10/2011   [facility]- Oct 2012   . Normal cardiac stress test 05/25/2011   [facility] [place]  . Normal echocardiogram 05/25/2011   EF 55%, mild TR  . RUQ pain 07/14/2012   . Substance abuse ([place])    narcotic addiction  . Thrombocytopenia ([place]) 11/06/2015  . Trigger middle finger of left hand 06/20/2012    Past Surgical History:  Procedure Laterality Date  . APPENDECTOMY    . BALLOON DILATION N/A 05/28/2020   Procedure: BALLOON DILATION;  Surgeon: Eloise Harman, DO;  [facility];  Service: Endoscopy;  Laterality: N/A;  . BIOPSY  05/28/2020   Procedure: BIOPSY;  Surgeon: Eloise Harman, DO;  [facility];  Service: Endoscopy;;  . CARPAL TUNNEL RELEASE     rt  . COLONOSCOPY  2008 [facility] ARS D100 V8 PHEN 12.5   2 SIMPLE ADENOMAS (< 1 CM)  . COLONOSCOPY WITH PROPOFOL N/A 12/05/2019   TI normal, 3 mm polyp in ascending colon, external and internal hemorrhoids. Polyp removed but not retrieved. Poor prep  . ESOPHAGOGASTRODUODENOSCOPY  04/26/2012   Dr. Oneida Alar: Non-erosive gastritis (inflammation) was found in the gastric antrum but no H.pylori; multiple biopsies (duodenal bx negative for Celiac)/ The mucosa of the esophagus appeared normal  . ESOPHAGOGASTRODUODENOSCOPY (EGD) WITH PROPOFOL N/A 12/05/2019   mild gastritis, duodenitis due to aspirin/NSAIDs.   Marland Kitchen ESOPHAGOGASTRODUODENOSCOPY (EGD) WITH PROPOFOL N/A 05/28/2020  Procedure: ESOPHAGOGASTRODUODENOSCOPY (EGD) WITH PROPOFOL;  Surgeon: Eloise Harman, DO;  [facility];  Service: Endoscopy;  Laterality: N/A;  2:30pm  . EUS  09/08/2012   Dr. Ardis Hughs: CBD dilated but no stones. Query secondary to Sphincter of Oddi stenosis, ?dysfunction, but clinically without symptoms  . FRACTURE SURGERY N/A    Phreesia 08/25/2020  . HARDWARE REMOVAL Right 02/12/2014   Procedure: HARDWARE REMOVAL;  Surgeon: Jolyn Nap, MD;  Location: [place];  Service: Orthopedics;  Laterality: Right;  . KNEE ARTHROSCOPY    . Neurostimulator implant    . POLYPECTOMY  12/05/2019   Procedure: POLYPECTOMY;  Surgeon: Danie Binder, MD;  [facility];  Service: Endoscopy;;  .  right ring finger    . spinal stenosis, had screws put in neck  04/11/2009   DR KRITZER  . TONSILLECTOMY    . ULNA OSTEOTOMY Right 02/12/2014   Procedure: RIGHT ULNAR SHORTENING AND OSTEOTOMY ;  Surgeon: Jolyn Nap, MD;  Location: [place];  Service: Orthopedics;  Laterality: Right;  . ULNAR NERVE TRANSPOSITION    . UPPER GASTROINTESTINAL ENDOSCOPY  2008 [facility] ABD PAIN WEIGHT LOSS d100 v8 phen 12.5   NL  . WRIST SURGERY Right jan 2015   Dr. Edmonia Lynch    Family History  Problem Relation Age of Onset  . Bladder Cancer Mother        rare form   . Cancer Sister        Metastatic abdominal  . Emotional abuse Sister   . Stroke Sister   . Emotional abuse Sister   . Heart failure Father   . Stroke Father   . Alcohol abuse Father   . Dementia Paternal Aunt   . Alcohol abuse Paternal Uncle   . Dementia Paternal Uncle   . Dementia Paternal Grandmother   . Stroke Paternal Grandmother   . ADD / ADHD Cousin   . Bipolar disorder Cousin   . Anxiety disorder Cousin   . Depression Cousin        Committed suicide  . Alcohol abuse Cousin   . OCD Cousin   . Paranoid behavior Cousin   . Brain cancer Maternal Grandfather   . Heart defect Other        FAMILY HX  . Colon cancer Maternal Uncle   . Colon cancer Cousin   . Colon polyps Neg Hx   . Drug abuse Neg Hx   . Schizophrenia Neg Hx   . Seizures Neg Hx   . Sexual abuse Neg Hx   . Physical abuse Neg Hx     Social History   Socioeconomic History  . Marital status: Divorced    Spouse name: Not on file  . Number of children: Not on file  . Years of education: Not on file  . Highest education level: Not on file  Occupational History  . Not on file  Tobacco Use  . Smoking status: Former Smoker    Packs/day: 1.50    Years: 8.00    Pack years: 12.00    Types: Cigarettes    Quit date: 08/13/1986    Years since quitting: 34.0  . Smokeless tobacco: Never Used  . Tobacco comment: quit 20 + yrs ago  Vaping Use   . Vaping Use: Never used  Substance and Sexual Activity  . Alcohol use: Not Currently    Alcohol/week: 0.0 standard drinks    Comment: Used to drink heavily- went to  treatment in May 2020.   Marland Kitchen Drug use: Not Currently    Comment: pain medications, klonopin- none in the last 17 months. Currently in [place]  . Sexual activity: Never  Other Topics Concern  . Not on file  Social History Narrative  . Not on file   Social Determinants of Health   Financial Resource Strain: Low Risk   . Difficulty of Paying Living Expenses: Not hard at all  Food Insecurity: No Food Insecurity  . Worried About Charity fundraiser in the Last Year: Never true  . Ran Out of Food in the Last Year: Never true  Transportation Needs: No Transportation Needs  . Lack of Transportation (Medical): No  . Lack of Transportation (Non-Medical): No  Physical Activity: Insufficiently Active  . Days of Exercise per Week: 3 days  . Minutes of Exercise per Session: 30 min  Stress: No Stress Concern Present  . Feeling of Stress : Only a little  Social Connections: Moderately Integrated  . Frequency of Communication with Friends and Family: More than three times a week  . Frequency of Social Gatherings with Friends and Family: More than three times a week  . Attends Religious Services: More than 4 times per year  . Active Member of Clubs or Organizations: Yes  . Attends Archivist Meetings: More than 4 times per year  . Marital Status: Divorced  Human resources officer Violence: Not At Risk  . Fear of Current or Ex-Partner: No  . Emotionally Abused: No  . Physically Abused: No  . Sexually Abused: No    Outpatient Medications Prior to Visit  Medication Sig Dispense Refill  . acetaminophen (TYLENOL) 500 MG tablet Take 1,000 mg by mouth every 8 (eight) hours as needed for moderate pain.     . Ascorbic Acid (VITAMIN C WITH ROSE HIPS) 500 MG tablet Take 500 mg by mouth daily.     Marland Kitchen aspirin EC 81 MG tablet Take 81 mg by  mouth daily.    Marland Kitchen atorvastatin (LIPITOR) 10 MG tablet Take 1 tablet (10 mg total) by mouth daily. 30 tablet 3  . cloNIDine (CATAPRES) 0.1 MG tablet Take 1 tablet (0.1 mg total) by mouth daily. 30 tablet 1  . lisinopril (ZESTRIL) 20 MG tablet Take 1 tablet (20 mg total) by mouth daily. 30 tablet 1  . lubiprostone (AMITIZA) 24 MCG capsule Take 24 mcg by mouth 2 (two) times daily with a meal.    . mirtazapine (REMERON) 15 MG tablet TAKE (1) TABLET BY MOUTH AT BEDTIME. 30 tablet 6  . Nutritional Supplements (KETO PO) Take 1 tablet by mouth in the morning, at noon, and at bedtime.     . Probiotic Product (PROBIOTIC DAILY PO) Take by mouth daily.    . RABEprazole (ACIPHEX) 20 MG tablet Take 1 tablet (20 mg total) by mouth 2 (two) times daily before a meal. 60 tablet 3  . senna (SENOKOT) 8.6 MG tablet Take 2 tablets by mouth daily.    . Turmeric 400 MG CAPS Take 800 mg by mouth daily.     . Vitamin D3 (VITAMIN D) 25 MCG tablet Take 4,000 Units by mouth daily.     Marland Kitchen linaclotide (LINZESS) 290 MCG CAPS capsule Take 1 capsule (290 mcg total) by mouth daily before breakfast. 90 capsule 3   No facility-administered medications prior to visit.    Allergies  Allergen Reactions  . Levofloxacin Nausea Only and Other (See Comments)    "Creepy" feeling, skin didn't feel  right, sort of itchy.  . Ciprofloxacin     Sweating  . Sulfa Antibiotics     Review of Systems  Constitutional: Negative.   HENT: Positive for sore throat. Negative for congestion, sinus pressure and sinus pain.   Respiratory: Negative.   Cardiovascular: Negative.   Gastrointestinal: Negative for abdominal distention, abdominal pain, constipation, diarrhea, nausea and vomiting.       Gastric reflux       Objective:    Physical Exam  There were no vitals taken for this visit. Wt Readings from Last 3 Encounters:  07/17/20 138 lb (62.6 kg)  07/12/20 139 lb 9.6 oz (63.3 kg)  06/19/20 142 lb (64.4 kg)    Health Maintenance Due   Topic Date Due  . PNA vac Low Risk Adult (2 of 2 - PPSV23) 02/28/2020  . COVID-19 Vaccine (3 - Booster for Moderna series) 07/04/2020    There are no preventive care reminders to display for this patient.   Lab Results  Component Value Date   TSH 2.01 05/03/2019   Lab Results  Component Value Date   WBC 4.9 06/04/2020   HGB 14.2 06/04/2020   HCT 42.1 06/04/2020   MCV 92.9 06/04/2020   PLT 148 06/04/2020   Lab Results  Component Value Date   NA 142 06/04/2020   K 5.0 06/04/2020   CO2 26 06/04/2020   GLUCOSE 100 (H) 06/04/2020   BUN 18 06/04/2020   CREATININE 1.05 06/04/2020   BILITOT 0.5 06/04/2020   ALKPHOS 57 05/21/2020   AST 18 06/04/2020   ALT 16 06/04/2020   PROT 6.8 06/04/2020   ALBUMIN 4.1 05/21/2020   CALCIUM 10.2 06/04/2020   ANIONGAP 9 05/21/2020   Lab Results  Component Value Date   CHOL 211 (H) 06/04/2020   Lab Results  Component Value Date   HDL 55 06/04/2020   Lab Results  Component Value Date   LDLCALC 140 (H) 06/04/2020   Lab Results  Component Value Date   TRIG 70 06/04/2020   Lab Results  Component Value Date   CHOLHDL 3.8 06/04/2020   Lab Results  Component Value Date   HGBA1C 5.3 06/04/2020       Assessment & Plan:   Problem List Items Addressed This Visit      Digestive   GERD - Primary    -he was recently swapped from nexium to aciphex, but that is not helping -he is not taking anything for PRN use -called in [place] for him -Encouraged him to reach out to GI since he has already been referred if his pain persists -if they are unable to help may add a pepcid or other H2 blocker      Relevant Medications   lubiprostone (AMITIZA) 24 MCG capsule       No orders of the defined types were placed in this encounter.  Date:  08/26/2020   Location of Patient: Home Location of Provider: Office Consent was obtain for visit to be over via telehealth. I verified that I am speaking with the correct person using two  identifiers.  I connected with  Satira Anis Thaxton on 08/26/20 via telephone and verified that I am speaking with the correct person using two identifiers.   I discussed the limitations of evaluation and management by telemedicine. The patient expressed understanding and agreed to proceed.  Time spent: 11 minutes   Noreene Larsson, NP

## 2020-08-26 NOTE — Patient Instructions (Signed)
Gastroesophageal Reflux Disease, Adult  Gastroesophageal reflux (GER) happens when acid from the stomach flows up into the tube that connects the mouth and the stomach (esophagus). Normally, food travels down the esophagus and stays in the stomach to be digested. With GER, food and stomach acid sometimes move back up into the esophagus. You may have a disease called gastroesophageal reflux disease (GERD) if the reflux:  Happens often.  Causes frequent or very bad symptoms.  Causes problems such as damage to the esophagus. When this happens, the esophagus becomes sore and swollen. Over time, GERD can make small holes (ulcers) in the lining of the esophagus. What are the causes? This condition is caused by a problem with the muscle between the esophagus and the stomach. When this muscle is weak or not normal, it does not close properly to keep food and acid from coming back up from the stomach. The muscle can be weak because of:  Tobacco use.  Pregnancy.  Having a certain type of hernia (hiatal hernia).  Alcohol use.  Certain foods and drinks, such as coffee, chocolate, onions, and peppermint. What increases the risk?  Being overweight.  Having a disease that affects your connective tissue.  Taking NSAIDs, such a ibuprofen. What are the signs or symptoms?  Heartburn.  Difficult or painful swallowing.  The feeling of having a lump in the throat.  A bitter taste in the mouth.  Bad breath.  Having a lot of saliva.  Having an upset or bloated stomach.  Burping.  Chest pain. Different conditions can cause chest pain. Make sure you see your doctor if you have chest pain.  Shortness of breath or wheezing.  A long-term cough or a cough at night.  Wearing away of the surface of teeth (tooth enamel).  Weight loss. How is this treated?  Making changes to your diet.  Taking medicine.  Having surgery. Treatment will depend on how bad your symptoms are. Follow these  instructions at home: Eating and drinking  Follow a diet as told by your doctor. You may need to avoid foods and drinks such as: ? Coffee and tea, with or without caffeine. ? Drinks that contain alcohol. ? Energy drinks and sports drinks. ? Bubbly (carbonated) drinks or sodas. ? Chocolate and cocoa. ? Peppermint and mint flavorings. ? Garlic and onions. ? Horseradish. ? Spicy and acidic foods. These include peppers, chili powder, curry powder, vinegar, hot sauces, and BBQ sauce. ? Citrus fruit juices and citrus fruits, such as oranges, lemons, and limes. ? Tomato-based foods. These include red sauce, chili, salsa, and pizza with red sauce. ? Fried and fatty foods. These include donuts, french fries, potato chips, and high-fat dressings. ? High-fat meats. These include hot dogs, rib eye steak, sausage, ham, and bacon. ? High-fat dairy items, such as whole milk, butter, and cream cheese.  Eat small meals often. Avoid eating large meals.  Avoid drinking large amounts of liquid with your meals.  Avoid eating meals during the 2-3 hours before bedtime.  Avoid lying down right after you eat.  Do not exercise right after you eat.   Lifestyle  Do not smoke or use any products that contain nicotine or tobacco. If you need help quitting, ask your doctor.  Try to lower your stress. If you need help doing this, ask your doctor.  If you are overweight, lose an amount of weight that is healthy for you. Ask your doctor about a safe weight loss goal.   General instructions    Pay attention to any changes in your symptoms.  Take over-the-counter and prescription medicines only as told by your doctor.  Do not take aspirin, ibuprofen, or other NSAIDs unless your doctor says it is okay.  Wear loose clothes. Do not wear anything tight around your waist.  Raise (elevate) the head of your bed about 6 inches (15 cm). You may need to use a wedge to do this.  Avoid bending over if this makes your  symptoms worse.  Keep all follow-up visits. Contact a doctor if:  You have new symptoms.  You lose weight and you do not know why.  You have trouble swallowing or it hurts to swallow.  You have wheezing or a cough that keeps happening.  You have a hoarse voice.  Your symptoms do not get better with treatment. Get help right away if:  You have sudden pain in your arms, neck, jaw, teeth, or back.  You suddenly feel sweaty, dizzy, or light-headed.  You have chest pain or shortness of breath.  You vomit and the vomit is green, yellow, or black, or it looks like blood or coffee grounds.  You faint.  Your poop (stool) is red, bloody, or black.  You cannot swallow, drink, or eat. These symptoms may represent a serious problem that is an emergency. Do not wait to see if the symptoms will go away. Get medical help right away. Call your local emergency services (911 in the [place] Do not drive yourself to the hospital. Summary  If a person has gastroesophageal reflux disease (GERD), food and stomach acid move back up into the esophagus and cause symptoms or problems such as damage to the esophagus.  Treatment will depend on how bad your symptoms are.  Follow a diet as told by your doctor.  Take all medicines only as told by your doctor. This information is not intended to replace advice given to you by your health care provider. Make sure you discuss any questions you have with your health care provider. Document Revised: 02/05/2020 Document Reviewed: 02/05/2020 Elsevier Patient Education  2021 [employer] Inc.  

## 2020-08-26 NOTE — Assessment & Plan Note (Signed)
-  he was recently swapped from nexium to aciphex, but that is not helping -he is not taking anything for PRN use -called in [place] for him -Encouraged him to reach out to GI since he has already been referred if his pain persists -if they are unable to help may add a pepcid or other H2 blocker

## 2020-08-28 ENCOUNTER — Ambulatory Visit: Payer: Medicare HMO | Admitting: Orthopedic Surgery

## 2020-09-11 NOTE — Progress Notes (Signed)
Referring Provider: Kerri Perches, MD Primary Care Physician:  Kerri Perches, MD Primary GI Physician: Dr. Marletta Lor  Chief Complaint  Patient presents with  . Dysphagia    Trouble solidsliquids  . Gastroesophageal Reflux    "feels like I am breathing through my stomach", acid coming back up    HPI:   Marc [name] is a 65 y.o. male presenting today for follow-up of GERD, dysphagia, constipation. History of the same. Also with history of colon polyps, hemorrhoids, and hep C genotype 1a, elastography F3/F4 s/p treatment in the past with Harvoni and documented SVR.  Post treatment elastography with kPa 6.2 in February 2021.    Last colonoscopy in April 2021 with poor prep, polyp removed but not retrieved.  Recommended repeat colonoscopy in 3 months.  This has yet to be completed.  Last EGD in October 2021 for dysphagia with benign-appearing esophageal stenosis s/p dilation, gastritis s/p biopsy, normal examined duodenum.  Pathology negative H. Pylori.  Last seen in our office 07/12/2020.  Noted slight improvement in dysphagia but continued to feel pills and some foods would get hung in his esophagus requiring him to bring items up and swallow again to allow items to pass.  Foods that cause trouble included toast and apples.  He had made significant dietary changes for GERD and was taking Protonix 40 mg twice daily, but continued with daily GERD symptoms.  He was having BMs daily to every 2-3 days but stools on the harder side and incomplete taking Linzess 290 mcg daily and senna 4 tablets daily.  Plan to stop Protonix and try Dexilant 60 mg daily, BPE, have nursing staff look into Amitiza prior authorization, continue Linzess 290 mcg daily for now, start Colace up to 3 capsules daily, use senna as needed  Telephone note 07/18/2020 stating Amitiza was approved.  BPE 07/18/2020: Tiny anterior cervical esophageal web, nonobstructing, barium tablet passed from oral cavity to stomach without  delay, normal motility.  Patient stated he stopped Dexilant as it made him feel nauseated, dizzy, and fatigued.  He was started on Nexium 40 mg twice daily.  Patient called 08/14/2020 reporting bloating, dysphagia, and GERD.  Nexium was switched to AcipHex 20 mg twice daily.  He also reported he had started taking Amitiza 24 mcg twice daily was having a BM every other day with some straining.  Advised to add 1 capful of MiraLAX daily.  Spoke with Dr. Marletta Lor who does not feel repeat dilation would be beneficial.  Agree with pursuing manometry and pH impedance testing.  Today:  Dysphagia: Has to concentrate in order to swallowing completely. Feels he has "half swallows". Feels his tongue is arched. Has been more aware of this over the last few months. States he has become more aware of everything more recently.  When swallowing, he feels his foods only go down slightly into his upper esophagus, he has to cough them back up, swallow again, and foods go down.  Occurring with most all foods at this time.  Also feels he is holding foods and liquids in his mouth a lot longer before he is able to swallow.  GERD: When he is stressed or tired, GERD symptoms are worse. Tried Aciphex 20 mg BID and didn't notice much improvement so he resumed Protonix 40 mg BID. Continues with acid reflux every day. Intermittent throughout the day.  States he is eating well. Eats several small portions throughout the day. No nausea or vomiting. Continues with mild discomfort in  the epigastric area, intermittent throughout the day. No NSAIDs other than baby aspirin. No alcohol. No early satiety.   Continues to avoid fired/fatty/greasy/spicy foods. No carbonated beverages. He does consume several fruits.   Walks a lot. Walked 7 miles Tuesday. This is typical several days a week.   Constipation: Taking Amitiza 24 mcg BID and 1 sennakot 1-3 times a day. Slowly reducing this. MiraLAX caused bloating. Bowels are moving daily to every  other day. Stools are now smooth and no straining. No blood in the stool or black stools.   Wants to hold off on colonoscopy for now.     Past Medical History:  Diagnosis Date  . Acute encephalopathy 12/29/2017   Hospitalized 5/22 to 5/23 with acute encephalopathy, unclear etiology  . Acute gout involving toe of right foot 09/14/2019  . Allergy   . Anxiety   . Arthritis   . Back problem   . Chest wall pain 03/28/2017  . Chronic hepatitis C without hepatic coma (HCC) 01/04/2009   Qualifier: Diagnosis of  By: De BlanchLewis PA-C, Leslie S. 2008: CT A/P NO CIRRHOSIS AUG 2013 MRCP- DILATED CBD/NO CIRRHOSIS DEC 2013 AST 99-102  ALT 145-156 PLT 147 HB 13.4 ALB 4.4-4.7 T BIL 0.7   . Depression   . Dilation of biliary tract 07/14/2012   EUS JAN 2014 BENIGN CBD DILATION   . Epigastric pain 07/14/2012  . GERD (gastroesophageal reflux disease)   . Gout   . HCV (hepatitis C virus) 06/16/2015  . Hepatitis C 1979  . Hepatitis C reactive    LIVER BX 2008-CHRONIC ACTIVE HEPATITIS  . HTN (hypertension)   . Hx of adenomatous colonic polyps 2008   due for surveillance 2017  . Hypercholesterolemia   . IBS (irritable bowel syndrome)   . Irritable bowel syndrome 01/04/2009   Qualifier: Diagnosis of  By: De BlanchLewis PA-C, Leslie S.   . Knee pain   . Left forearm pain 08/15/2017  . Leukopenia 11/06/2015  . Narcotic addiction [facility](HCC) 06/10/2011   [facility]   . Normal cardiac stress test 05/25/2011   [facility]  . Normal echocardiogram 05/25/2011   EF 55%, mild TR  . RUQ pain 07/14/2012  . Substance abuse (HCC)    narcotic addiction  . Thrombocytopenia (HCC) 11/06/2015  . Trigger middle finger of left hand 06/20/2012    Past Surgical History:  Procedure Laterality Date  . APPENDECTOMY    . BALLOON DILATION N/A 05/28/2020   Procedure: BALLOON DILATION;  Surgeon: Lanelle Balarver, Charles K, DO;  [facility];  Service: Endoscopy;  Laterality: N/A;  . BIOPSY  05/28/2020    Procedure: BIOPSY;  Surgeon: Lanelle Balarver, Charles K, DO;  [facility];  Service: Endoscopy;;  . CARPAL TUNNEL RELEASE     rt  . COLONOSCOPY  2008 [facility] ARS D100 V8 PHEN 12.5   2 SIMPLE ADENOMAS (< 1 CM)  . COLONOSCOPY WITH PROPOFOL N/A 12/05/2019   TI normal, 3 mm polyp in ascending colon, external and internal hemorrhoids. Polyp removed but not retrieved. Poor prep  . ESOPHAGOGASTRODUODENOSCOPY  04/26/2012   Dr. Darrick PennaFields: Non-erosive gastritis (inflammation) was found in the gastric antrum but no H.pylori; multiple biopsies (duodenal bx negative for Celiac)/ The mucosa of the esophagus appeared normal  . ESOPHAGOGASTRODUODENOSCOPY (EGD) WITH PROPOFOL N/A 12/05/2019   mild gastritis, duodenitis due to aspirin/NSAIDs.   Marland Kitchen. ESOPHAGOGASTRODUODENOSCOPY (EGD) WITH PROPOFOL N/A 05/28/2020   Procedure: ESOPHAGOGASTRODUODENOSCOPY (EGD) WITH PROPOFOL;  Surgeon: Earnest Baileyarver, Charles  K, DO;  [facility];  Service: Endoscopy;  Laterality: N/A;  2:30pm  . EUS  09/08/2012   Dr. Ardis Hughs: CBD dilated but no stones. Query secondary to Sphincter of Oddi stenosis, ?dysfunction, but clinically without symptoms  . FRACTURE SURGERY N/A    [employer] 08/25/2020  . HARDWARE REMOVAL Right 02/12/2014   Procedure: HARDWARE REMOVAL;  Surgeon: Jolyn Nap, MD;  Location: [place];  Service: Orthopedics;  Laterality: Right;  . KNEE ARTHROSCOPY    . Neurostimulator implant    . POLYPECTOMY  12/05/2019   Procedure: POLYPECTOMY;  Surgeon: Danie Binder, MD;  [facility];  Service: Endoscopy;;  . right ring finger    . spinal stenosis, had screws put in neck  04/11/2009   DR KRITZER  . TONSILLECTOMY    . ULNA OSTEOTOMY Right 02/12/2014   Procedure: RIGHT ULNAR SHORTENING AND OSTEOTOMY ;  Surgeon: Jolyn Nap, MD;  Location: [place];  Service: Orthopedics;  Laterality: Right;  . ULNAR NERVE TRANSPOSITION    . UPPER GASTROINTESTINAL ENDOSCOPY  2008 [facility] ABD PAIN WEIGHT  LOSS d100 v8 phen 12.5   NL  . WRIST SURGERY Right jan 2015   Dr. Edmonia Lynch    Current Outpatient Medications  Medication Sig Dispense Refill  . acetaminophen (TYLENOL) 500 MG tablet Take 1,000 mg by mouth every 8 (eight) hours as needed for moderate pain.     . Ascorbic Acid (VITAMIN C WITH ROSE HIPS) 500 MG tablet Take 500 mg by mouth daily.     Marland Kitchen aspirin EC 81 MG tablet Take 81 mg by mouth daily.    Marland Kitchen atorvastatin (LIPITOR) 10 MG tablet Take 1 tablet (10 mg total) by mouth daily. 30 tablet 3  . cloNIDine (CATAPRES) 0.1 MG tablet Take 1 tablet (0.1 mg total) by mouth daily. 30 tablet 1  . lansoprazole (PREVACID) 30 MG capsule Take 1 capsule (30 mg total) by mouth 2 (two) times daily before a meal. 60 capsule 3  . lubiprostone (AMITIZA) 24 MCG capsule Take 24 mcg by mouth 2 (two) times daily with a meal.    . mirtazapine (REMERON) 15 MG tablet TAKE (1) TABLET BY MOUTH AT BEDTIME. 30 tablet 6  . Nutritional Supplements (KETO PO) Take 1 tablet by mouth in the morning, at noon, and at bedtime.     . Probiotic Product (PROBIOTIC DAILY PO) Take by mouth daily.    Marland Kitchen senna (SENOKOT) 8.6 MG tablet Take 2 tablets by mouth daily.    . Turmeric 400 MG CAPS Take 800 mg by mouth daily.     . Vitamin D3 (VITAMIN D) 25 MCG tablet Take 4,000 Units by mouth daily.      No current facility-administered medications for this visit.    Allergies as of 09/12/2020 - Review Complete 09/12/2020  Allergen Reaction Noted  . Levofloxacin Nausea Only and Other (See Comments)   . Ciprofloxacin  06/13/2015  . Sulfa antibiotics  12/02/2012    Family History  Problem Relation Age of Onset  . Bladder Cancer Mother        rare form   . Cancer Sister        Metastatic abdominal  . Emotional abuse Sister   . Stroke Sister   . Emotional abuse Sister   . Heart failure Father   . Stroke Father   . Alcohol abuse Father   . Dementia Paternal Aunt   . Alcohol abuse Paternal Uncle   .  Dementia Paternal Uncle    . Dementia Paternal Grandmother   . Stroke Paternal Grandmother   . ADD / ADHD Cousin   . Bipolar disorder Cousin   . Anxiety disorder Cousin   . Depression Cousin        Committed suicide  . Alcohol abuse Cousin   . OCD Cousin   . Paranoid behavior Cousin   . Brain cancer Maternal Grandfather   . Heart defect Other        FAMILY HX  . Colon cancer Maternal Uncle   . Colon cancer Cousin   . Colon polyps Neg Hx   . Drug abuse Neg Hx   . Schizophrenia Neg Hx   . Seizures Neg Hx   . Sexual abuse Neg Hx   . Physical abuse Neg Hx     Social History   Socioeconomic History  . Marital status: Divorced    Spouse name: Not on file  . Number of children: Not on file  . Years of education: Not on file  . Highest education level: Not on file  Occupational History  . Not on file  Tobacco Use  . Smoking status: Former Smoker    Packs/day: 1.50    Years: 8.00    Pack years: 12.00    Types: Cigarettes    Quit date: 08/13/1986    Years since quitting: 34.1  . Smokeless tobacco: Never Used  . Tobacco comment: quit 20 + yrs ago  Vaping Use  . Vaping Use: Never used  Substance and Sexual Activity  . Alcohol use: Not Currently    Alcohol/week: 0.0 standard drinks    Comment: Used to drink heavily- went to treatment in May 2020.   Marland Kitchen Drug use: Not Currently    Comment: pain medications, klonopin- none in the last 17 months. Currently in [place]  . Sexual activity: Never  Other Topics Concern  . Not on file  Social History Narrative  . Not on file   Social Determinants of Health   Financial Resource Strain: Low Risk   . Difficulty of Paying Living Expenses: Not hard at all  Food Insecurity: No Food Insecurity  . Worried About Charity fundraiser in the Last Year: Never true  . Ran Out of Food in the Last Year: Never true  Transportation Needs: No Transportation Needs  . Lack of Transportation (Medical): No  . Lack of Transportation (Non-Medical): No  Physical Activity:  Insufficiently Active  . Days of Exercise per Week: 3 days  . Minutes of Exercise per Session: 30 min  Stress: No Stress Concern Present  . Feeling of Stress : Only a little  Social Connections: Moderately Integrated  . Frequency of Communication with Friends and Family: More than three times a week  . Frequency of Social Gatherings with Friends and Family: More than three times a week  . Attends Religious Services: More than 4 times per year  . Active Member of Clubs or Organizations: Yes  . Attends Archivist Meetings: More than 4 times per year  . Marital Status: Divorced    Review of Systems: Gen: Denies fever, chills, cold or flulike symptoms, lightheadedness, dizziness, presyncope, syncope. CV: Denies chest pain. Occasional palpitations.  Resp: Denies dyspnea or cough. GI: See HPI Heme: See HPI  Physical Exam: BP 134/71   Pulse (!) 51   Temp (!) 97.1 F (36.2 C)   Ht 5\' 10"  (1.778 m)   Wt 135 lb 3.2 oz (61.3 kg)  BMI 19.40 kg/m  General:   Alert and oriented. No distress noted. Pleasant and cooperative.  Head:  Normocephalic and atraumatic. Eyes:  Conjuctiva clear without scleral icterus. Mouth:  Oral mucosa pink and moist. No lesions. Tongue appears normal with normal motility. Oropharynx appears normal. Uvula is midline.  Neck: No thyromegaly or palpable abnormalities in neck when swallowing. Heart:  S1, S2 present without murmurs appreciated. Lungs:  Clear to auscultation bilaterally. No wheezes, rales, or rhonchi. No distress.  Abdomen:  +BS, soft, non-tender and non-distended. No rebound or guarding. No HSM or masses noted. Msk:  Symmetrical without gross deformities. Normal posture. Extremities:  Without edema. Neurologic:  Alert and  oriented x4 Psych: Normal mood and affect.

## 2020-09-12 ENCOUNTER — Encounter: Payer: Self-pay | Admitting: Gastroenterology

## 2020-09-12 ENCOUNTER — Other Ambulatory Visit: Payer: Self-pay

## 2020-09-12 ENCOUNTER — Ambulatory Visit: Payer: Medicare HMO | Admitting: Gastroenterology

## 2020-09-12 VITALS — BP 134/71 | HR 51 | Temp 97.1°F | Ht 70.0 in | Wt 135.2 lb

## 2020-09-12 DIAGNOSIS — K5900 Constipation, unspecified: Secondary | ICD-10-CM | POA: Diagnosis not present

## 2020-09-12 DIAGNOSIS — R1310 Dysphagia, unspecified: Secondary | ICD-10-CM

## 2020-09-12 DIAGNOSIS — K219 Gastro-esophageal reflux disease without esophagitis: Secondary | ICD-10-CM

## 2020-09-12 NOTE — Patient Instructions (Addendum)
Will refer you to speech-language pathologist to help evaluate your trouble swallowing.  We are also arranging you for a test called esophageal manometry and pH impedance testing to further evaluate dysphagia and uncontrolled acid reflux.  Please stop Protonix and try Prevacid 30 mg twice daily 30 minutes before breakfast and dinner. Please call with a progress report in 2 weeks.  Try limiting the acidic fruits that you are eating frequently.  General GERD diet/lifestyle recommendations: Avoid fried, fatty, greasy, spicy, citrus foods. Avoid caffeine and carbonated beverages. Avoid chocolate. Try eating 4-6 small meals a day rather than 3 large meals. Do not eat within 3 hours of laying down. Prop head of bed up on wood or bricks to create a 6 inch incline.  Avoid all NSAIDs including ibuprofen, Aleve, Advil, Goody powders, naproxen, and anything that says "NSAID" on the package.  For constipation:  Continue Amitiza 24 mcg twice daily with a meal. Add Benefiber 2 teaspoons daily.  Increase this to three times daily as tolerated. Continue using Senokot as needed.  We will follow up with you in 6-8 weeks.  Hopefully, this will be after esophageal manometry/pH-impedance testing.  Aliene Altes, PA-C [name]

## 2020-09-16 ENCOUNTER — Telehealth: Payer: Self-pay | Admitting: Gastroenterology

## 2020-09-16 ENCOUNTER — Other Ambulatory Visit (HOSPITAL_COMMUNITY): Payer: Self-pay | Admitting: Specialist

## 2020-09-16 DIAGNOSIS — R1319 Other dysphagia: Secondary | ICD-10-CM

## 2020-09-16 MED ORDER — LANSOPRAZOLE 30 MG PO CPDR: 30.0000 mg | DELAYED_RELEASE_CAPSULE | Freq: Two times a day (BID) | ORAL | 3 refills | Status: DC

## 2020-09-16 NOTE — Assessment & Plan Note (Addendum)
65 year old male who had new onset dysphagia in August 2021 in setting of uncontrolled GERD.  EGD in October 2021 with benign-appearing esophageal stenosis s/p dilation.  Patient reported only minimal improvement in dysphagia thereafter.  Follow-up BPE 07/18/2020 with tiny anterior cervical esophageal web, nonobstructing, barium tablet passed without delay, normal motility.  Discussed with Dr. Abbey Chatters who did not feel repeat dilation would be beneficial.  Over the last couple of months, patient feels he is now having to concentrate and make an effort in order to swallow completely.  Feels his tongue is more arched and touching the top of his mouth. Continues to have sensation of foods going down just slightly to his upper esophagus, coughs then back up, swallows again and foods go down.  GERD also continues to be uncontrolled which is discussed below. On exam today, there is no evidence of thyromegaly or palpable abnormalities in neck when swallowing.  No significant abnormalities in his oropharynx.  Tongue appears normal with normal motility.  Symptoms now seem much more oropharyngeal rather than esophageal phase dysphagia.  I am referring patient to SLP for further evaluation.  Additionally, we are pursuing esophageal manometry and pH/impedance testing.  Follow-up in 6-8 weeks.

## 2020-09-16 NOTE — Assessment & Plan Note (Signed)
Chronic history of constipation.  Switch from Linzess 290 mcg to Amitiza 24 mcg twice daily in December 2021.  Also taking 1-3 Senokot daily, down from 4 Senokot daily with Linzess.  Tried MiraLAX, but this caused bloating.  Bowels are moving daily to every other day and are now smooth without straining.  No alarm symptoms.  He does have history of colon polyps with last colonoscopy in April 2021 incomplete due to poor prep.  He is due for repeat colonoscopy, but is requesting to hold off on this for now.  Plan: Continue Amitiza 24 mcg twice daily with meals. Add Benefiber 2 teaspoons daily and increase to 3 times daily as tolerated. May continue using Senokot as needed for now.  Hopefully, he will be able to reduce frequency of Senakot with adding Benefiber.

## 2020-09-16 NOTE — Telephone Encounter (Signed)
I was finishing up patient's chart and realized I did not send in Prevacid. Please let patient know I have sent a prescription for Prevacid 30 mg twice daily to his pharmacy.  Please also clarify instructions for Benefiber.  Instructions should read take 2 teaspoons of Benefiber daily and increase to 3 times daily as tolerated.  Not 2 tablespoons.

## 2020-09-16 NOTE — Assessment & Plan Note (Addendum)
Chronic history of GERD that continues to be uncontrolled failing multiple PPIs at this point. He has been on omeprazole, Protonix, Nexium, AcipHex, and Dexilant.  Unable to take Dexilant due to nausea and dizziness.  Otherwise, Protonix seems to provide the most relief, but he continues with symptoms daily.  He has also made significant dietary changes with no noticeable improvement. Associated mild intermittent epigastric discomfort. No NSAIDs other than baby aspirin.  Prior EGD October 2021 with benign-appearing esophageal stenosis s/p dilation, gastritis s/p biopsy, normal examined duodenum.  Pathology negative for H. pylori.  Plan:  Will try one more PPI switch. Stop Protonix and start Prevacid 30 mg BID.  Requested progress report in 2 weeks. Will trying adding famotidine if he continues with breakthrough symptoms.  Proceed with esophageal pH impedance and manometry due to persistent GERD and dysphagia (discussed separately). Counseled again on GERD diet/lifestyle.  Advised he try to limit some of the acidic fruits he is consuming. Avoid all NSAIDs. Follow-up in 6-8 weeks.

## 2020-09-17 NOTE — Telephone Encounter (Signed)
Phoned and advised the pt that a Rx for Prevacid 30 mg has been sent to his pharmacy to take twice daily. Clarified the instructions of the Benefiber with the pt. Pt agreed.

## 2020-09-18 ENCOUNTER — Telehealth (HOSPITAL_COMMUNITY): Payer: Self-pay | Admitting: Speech Pathology

## 2020-09-18 NOTE — Telephone Encounter (Signed)
Returned pt's phone call to confirm he does go to [facility] Radiology Dept for this swallow test.

## 2020-09-19 ENCOUNTER — Ambulatory Visit (HOSPITAL_COMMUNITY): Payer: Medicare HMO | Attending: Gastroenterology | Admitting: Speech Pathology

## 2020-09-19 ENCOUNTER — Other Ambulatory Visit: Payer: Self-pay

## 2020-09-19 ENCOUNTER — Ambulatory Visit (HOSPITAL_COMMUNITY)
Admission: RE | Admit: 2020-09-19 | Discharge: 2020-09-19 | Disposition: A | Discharge status: Home / Self Care | Payer: Medicare HMO | Source: Ambulatory Visit | Attending: Gastroenterology | Admitting: Gastroenterology

## 2020-09-19 ENCOUNTER — Encounter (HOSPITAL_COMMUNITY): Payer: Self-pay | Admitting: Speech Pathology

## 2020-09-19 DIAGNOSIS — R1319 Other dysphagia: Secondary | ICD-10-CM | POA: Insufficient documentation

## 2020-09-19 DIAGNOSIS — R1310 Dysphagia, unspecified: Secondary | ICD-10-CM | POA: Diagnosis not present

## 2020-09-19 DIAGNOSIS — R1312 Dysphagia, oropharyngeal phase: Secondary | ICD-10-CM | POA: Insufficient documentation

## 2020-09-19 DIAGNOSIS — R059 Cough, unspecified: Secondary | ICD-10-CM | POA: Diagnosis not present

## 2020-09-19 NOTE — Therapy (Signed)
[place], [place], [address] Phone: [phone]   Fax:  [phone]  Modified Barium Swallow  Patient Details  Name: JOURDAIN GUAY MRN: [id number] Date of Birth: [birth year] No data recorded  Encounter Date: 09/19/2020   End of Session - 09/19/20 1503    Visit Number 1    Number of Visits 1    Authorization Type [employer]    SLP Start Time 1140    SLP Stop Time  1210    SLP Time Calculation (min) 30 min    Activity Tolerance Patient tolerated treatment well           Past Medical History:  Diagnosis Date  . Acute encephalopathy 12/29/2017   Hospitalized 5/22 to 5/23 with acute encephalopathy, unclear etiology  . Acute gout involving toe of right foot 09/14/2019  . Allergy   . Anxiety   . Arthritis   . Back problem   . Chest wall pain 03/28/2017  . Chronic hepatitis C without hepatic coma ([place]) 01/04/2009   Qualifier: Diagnosis of  By: Westly Pam. 2008: CT A/P NO CIRRHOSIS AUG 2013 MRCP- DILATED CBD/NO CIRRHOSIS DEC 2013 AST 99-102  ALT 145-156 PLT 147 HB 13.4 ALB 4.4-4.7 T BIL 0.7   . Depression   . Dilation of biliary tract 07/14/2012   EUS JAN 2014 BENIGN CBD DILATION   . Epigastric pain 07/14/2012  . GERD (gastroesophageal reflux disease)   . Gout   . HCV (hepatitis C virus) 06/16/2015  . Hepatitis C 1979  . Hepatitis C reactive    LIVER BX 2008-CHRONIC ACTIVE HEPATITIS  . HTN (hypertension)   . Hx of adenomatous colonic polyps 2008   due for surveillance 2017  . Hypercholesterolemia   . IBS (irritable bowel syndrome)   . Irritable bowel syndrome 01/04/2009   Qualifier: Diagnosis of  By: Westly Pam.   . Knee pain   . Left forearm pain 08/15/2017  . Leukopenia 11/06/2015  . Narcotic addiction [facility]) 06/10/2011   [facility]- Oct 2012   . Normal cardiac stress test 05/25/2011   [facility]  . Normal echocardiogram 05/25/2011   EF 55%, mild  TR  . RUQ pain 07/14/2012  . Substance abuse ([place])    narcotic addiction  . Thrombocytopenia ([place]) 11/06/2015  . Trigger middle finger of left hand 06/20/2012    Past Surgical History:  Procedure Laterality Date  . APPENDECTOMY    . BALLOON DILATION N/A 05/28/2020   Procedure: BALLOON DILATION;  Surgeon: Eloise Harman, DO;  [facility];  Service: Endoscopy;  Laterality: N/A;  . BIOPSY  05/28/2020   Procedure: BIOPSY;  Surgeon: Eloise Harman, DO;  [facility];  Service: Endoscopy;;  . CARPAL TUNNEL RELEASE     rt  . COLONOSCOPY  2008 SLF ARS D100 V8 PHEN 12.5   2 SIMPLE ADENOMAS (< 1 CM)  . COLONOSCOPY WITH PROPOFOL N/A 12/05/2019   TI normal, 3 mm polyp in ascending colon, external and internal hemorrhoids. Polyp removed but not retrieved. Poor prep  . ESOPHAGOGASTRODUODENOSCOPY  04/26/2012   Dr. Oneida Alar: Non-erosive gastritis (inflammation) was found in the gastric antrum but no H.pylori; multiple biopsies (duodenal bx negative for Celiac)/ The mucosa of the esophagus appeared normal  . ESOPHAGOGASTRODUODENOSCOPY (EGD) WITH PROPOFOL N/A 12/05/2019   mild gastritis, duodenitis due to aspirin/NSAIDs.   Marland Kitchen ESOPHAGOGASTRODUODENOSCOPY (EGD) WITH PROPOFOL N/A 05/28/2020  Procedure: ESOPHAGOGASTRODUODENOSCOPY (EGD) WITH PROPOFOL;  Surgeon: Eloise Harman, DO;  [facility];  Service: Endoscopy;  Laterality: N/A;  2:30pm  . EUS  09/08/2012   Dr. Ardis Hughs: CBD dilated but no stones. Query secondary to Sphincter of Oddi stenosis, ?dysfunction, but clinically without symptoms  . FRACTURE SURGERY N/A    Phreesia 08/25/2020  . HARDWARE REMOVAL Right 02/12/2014   Procedure: HARDWARE REMOVAL;  Surgeon: Jolyn Nap, MD;  Location: [place];  Service: Orthopedics;  Laterality: Right;  . KNEE ARTHROSCOPY    . Neurostimulator implant    . POLYPECTOMY  12/05/2019   Procedure: POLYPECTOMY;  Surgeon: Danie Binder, MD;  [facility];   Service: Endoscopy;;  . right ring finger    . spinal stenosis, had screws put in neck  04/11/2009   DR KRITZER  . TONSILLECTOMY    . ULNA OSTEOTOMY Right 02/12/2014   Procedure: RIGHT ULNAR SHORTENING AND OSTEOTOMY ;  Surgeon: Jolyn Nap, MD;  Location: [facility];  Service: Orthopedics;  Laterality: Right;  . ULNAR NERVE TRANSPOSITION    . UPPER GASTROINTESTINAL ENDOSCOPY  2008 SLF ABD PAIN WEIGHT LOSS d100 v8 phen 12.5   NL  . WRIST SURGERY Right jan 2015   Dr. Edmonia Lynch    There were no vitals filed for this visit.   Subjective Assessment - 09/19/20 1444    Subjective "It takes a lot of effort for me to swallow."    Special Tests MBSS    Currently in Pain? No/denies               General - 09/19/20 1446      General Information   Date of Onset 09/10/20    HPI Marc [name] is a 65 y.o. male who was referred by Aliene Altes, PA-C for MBSS due to long standing c/o dysphagia. Pt was seen by ENT, Dr. Melony Overly 11/27/2019 for laryngoscopy: Oral: Lips and gums without lesions. Tongue and palate mucosa without lesions. Posterior oropharynx clear.  Indirect laryngoscopy revealed a clear base of tongue vallecula and epiglottis. Fiberoptic laryngoscopy was performed through the right nostril.  On fiberoptic laryngoscopy the middle meatus and posterior nasopharynx was clear.  No active drainage noted within the posterior nasal cavity.  The base of tongue vallecula epiglottis were normal.  He did have moderate supraglottic mucus but this was clear.  Vocal cords were clear with normal vocal cord mobility.  He had mild arytenoid edema that might be secondary to reflux problems or chronic throat clearing.  No mucosal abnormalities noted. Last EGD in October 2021 for dysphagia with benign-appearing esophageal stenosis s/p dilation, gastritis s/p biopsy, normal examined duodenum.  Pathology negative H. Pylori. BPE 07/18/2020: Tiny anterior cervical esophageal web,  nonobstructing, barium tablet passed from oral cavity to stomach without delay, normal motility.  Patient stated he stopped Dexilant as it made him feel nauseated, dizzy, and fatigued.  He was started on Nexium 40 mg twice daily, but this medication was changed to AcipHex, Amitiza, and MiraLax. Dr. Abbey Chatters did not feel repeat dilation would be beneficial.  Plan is to pursue manometry and pH impedance testing. He states he has to concentrate in order to swallow completely. Feels he has "half swallows". Feels his tongue is arched. Has been more aware of this over the last few months. States he has become more aware of everything more recently.  When swallowing, he feels his foods only go down slightly into  his upper esophagus, he has to cough them back up, swallow again, and foods go down.  Occurring with most all foods at this time.  Also feels he is holding foods and liquids in his mouth a lot longer before he is able to swallow. He has made significant changes regarding GERD precautions, but has not elevated the head of his bed. He was diagnosed with lichen planus and plaque psoriasis. ? autoimmune component to dysphagia?    Type of Study MBS-Modified Barium Swallow Study    Diet Prior to this Study Regular;Thin liquids    Temperature Spikes Noted No    Respiratory Status Room air    History of Recent Intubation No    Behavior/Cognition Alert;Cooperative;Pleasant mood    Oral Cavity Assessment Within Functional Limits    Oral Care Completed by SLP No    Oral Cavity - Dentition Adequate natural dentition    Vision Functional for self feeding    Self-Feeding Abilities Able to feed self    Patient Positioning Upright in chair    Baseline Vocal Quality Normal    Volitional Cough Strong    Volitional Swallow Able to elicit    Anatomy Within functional limits    Pharyngeal Secretions Not observed secondary MBS              Oral Preparation/Oral Phase - 09/19/20 1447      Oral Preparation/Oral  Phase   Oral Phase Within functional limits      Electrical stimulation - Oral Phase   Was Electrical Stimulation Used No            Pharyngeal Phase - 09/19/20 1458      Pharyngeal Phase   Pharyngeal Phase Impaired      Pharyngeal - Thin   Pharyngeal- Thin Teaspoon Swallow initiation at vallecula;Penetration/Aspiration during swallow    Pharyngeal Material does not enter airway;Material enters airway, remains ABOVE vocal cords then ejected out    Pharyngeal- Thin Cup Swallow initiation at vallecula;Reduced tongue base retraction;Pharyngeal residue - valleculae;Pharyngeal residue - pyriform    Pharyngeal- Thin Straw Pharyngeal residue - pyriform;Pharyngeal residue - valleculae      Pharyngeal - Solids   Pharyngeal- Puree Within functional limits    Pharyngeal- Regular Within functional limits    Pharyngeal- Pill Within functional limits      Electrical Stimulation - Pharyngeal Phase   Was Electrical Stimulation Used No            Cricopharyngeal Phase - 09/19/20 1500      Cervical Esophageal Phase   Cervical Esophageal Phase Impaired      Cervical Esophageal Phase - Solids   Pill Prominent cricopharyngeal segment              Plan - 09/19/20 1504    Clinical Impression Statement Pt presents with mild oropharyngeal dysphagia characterized by timely swallow trigger, min decreased tongue base retraction with pharyngeal wall approximation, adequate hyoloaryngeal excursion, and good airway protection resulting in trace/mild liquid residuals along base of tongue to valleculae and mild pyriform pooling after primary swallow. A secondary swallow completely clears the minimal residuals. Pt cued to implement an "effortful" swallow with one of the bites of puree which elicited improved tongue base retraction and no residuals. Pt also noted to have a prominent cricopharyngeus. Swallow function was essentially WNL. Pt's reports of water brash, throat clearing, globus, and coughing  may be attributed to laryngopharyngeal reflux. He has a history of drug and alcohol abuse and has some auto  immune markers (psoriasis, dry eye, and lichen planus) which could also be contributing to his symptoms. It is recommended that he implement the effortful/hard swallow with solids and swallow 2x for each sip which effectively cleared the minimal amount of residuals (he did this spontaneously). He was also advised to elevate the head of his bed, as he has implemented all other reflux precautions besides this one. SLP will sign off. He was given my contact information should he have any further questions.    Consulted and Agree with Plan of Care Patient           Patient will benefit from skilled therapeutic intervention in order to improve the following deficits and impairments:   Dysphagia, oropharyngeal phase     Recommendations/Treatment - 09/19/20 1501      Swallow Evaluation Recommendations   SLP Diet Recommendations Age appropriate regular;Thin    Liquid Administration via Cup;Straw    Medication Administration Whole meds with liquid    Supervision Patient able to self feed    Compensations Multiple dry swallows after each bite/sip;Effortful swallow    Postural Changes Seated upright at 90 degrees;Remain upright for at least 30 minutes after feeds/meals            Prognosis - 09/19/20 1502      Prognosis   Prognosis for Safe Diet Advancement Good      Individuals Consulted   Consulted and Agree with Results and Recommendations Patient    Report Sent to  Referring physician           Problem List Patient Active Problem List   Diagnosis Date Noted  . Annual visit for general adult medical examination with abnormal findings 06/04/2020  . Encounter for screening for malignant neoplasm of prostate 06/04/2020  . Need for immunization against influenza 06/04/2020  . Dysphagia 05/09/2020  . Hemorrhoids 02/29/2020  . Bloating 01/05/2020  . Polyp of ascending colon    . Rectal bleeding   . Nasal drainage 10/12/2019  . History of hepatitis C 09/07/2019  . Constipation 09/07/2019  . Rectal pain 09/07/2019  . History of colonic polyps 11/13/2015  . MDD (major depressive disorder) 01/21/2015  . Seasonal allergies 12/27/2014  . Vitamin D deficiency 06/20/2012  . Chronic pain syndrome 12/24/2011  . Essential hypertension 09/06/2010  . Hyperlipidemia 04/10/2010  . Prediabetes 04/10/2010  . INSOMNIA 11/07/2009  . Anxiety and depression 09/04/2009  . GERD 01/03/2009   Thank you,  Genene Churn, [place]  [facility] 09/19/2020, 3:15 PM  [place] [address] [place], [place], [address] Phone: [phone]   Fax:  [phone]  Name: BRYLEN WAGAR MRN: [id number] Date of Birth: 13-Feb-1956

## 2020-09-30 ENCOUNTER — Encounter: Payer: Self-pay | Admitting: Physician Assistant

## 2020-10-09 ENCOUNTER — Telehealth: Payer: Self-pay | Admitting: Internal Medicine

## 2020-10-09 NOTE — Telephone Encounter (Signed)
Pt said his medication for reflux wasn't helping him and what would Dr Abbey Chatters recommend. Please advise. [phone]

## 2020-10-09 NOTE — Telephone Encounter (Signed)
Dr. Abbey Chatters this pt is having a hard time with his reflux ,wants to know what can he add to what he is taking to help him. (Any OTC's as well) Please advise.

## 2020-10-15 ENCOUNTER — Ambulatory Visit: Payer: Medicare HMO | Admitting: Physician Assistant

## 2020-10-15 ENCOUNTER — Ambulatory Visit (INDEPENDENT_AMBULATORY_CARE_PROVIDER_SITE_OTHER): Payer: Medicare HMO | Admitting: Nurse Practitioner

## 2020-10-15 ENCOUNTER — Encounter: Payer: Self-pay | Admitting: Nurse Practitioner

## 2020-10-15 ENCOUNTER — Encounter: Payer: Self-pay | Admitting: Physician Assistant

## 2020-10-15 ENCOUNTER — Other Ambulatory Visit: Payer: Self-pay

## 2020-10-15 VITALS — BP 130/70 | HR 56 | Ht 68.25 in | Wt 135.0 lb

## 2020-10-15 VITALS — BP 139/69 | HR 50 | Temp 98.0°F | Resp 20 | Ht 70.0 in | Wt 133.0 lb

## 2020-10-15 DIAGNOSIS — I10 Essential (primary) hypertension: Secondary | ICD-10-CM | POA: Diagnosis not present

## 2020-10-15 DIAGNOSIS — K219 Gastro-esophageal reflux disease without esophagitis: Secondary | ICD-10-CM | POA: Diagnosis not present

## 2020-10-15 DIAGNOSIS — E785 Hyperlipidemia, unspecified: Secondary | ICD-10-CM

## 2020-10-15 DIAGNOSIS — R1310 Dysphagia, unspecified: Secondary | ICD-10-CM | POA: Diagnosis not present

## 2020-10-15 MED ORDER — FAMOTIDINE 20 MG PO TABS: 20.0000 mg | ORAL_TABLET | Freq: Two times a day (BID) | ORAL | 5 refills | Status: DC

## 2020-10-15 NOTE — Progress Notes (Signed)
Acute Office Visit  Subjective:    Patient ID: Marc [name], male    DOB: 30-Jul-1956, 65 y.o.   MRN: [id number]  Chief Complaint  Patient presents with  . Hypertension  . Gastroesophageal Reflux    HPI Patient is in today for BP check. He was seen on 07/17/20 for BP check. At that time, his lisinopril was refilled. He states he is still having severe GERD, and this has been ongoing for 15 months. He sees [facility] and has upcoming endoscopy.  Past Medical History:  Diagnosis Date  . Acute encephalopathy 12/29/2017   Hospitalized 5/22 to 5/23 with acute encephalopathy, unclear etiology  . Acute gout involving toe of right foot 09/14/2019  . Allergy   . Anxiety   . Arthritis   . Back problem   . Chest wall pain 03/28/2017  . Chronic hepatitis C without hepatic coma ([place]) 01/04/2009   Qualifier: Diagnosis of  By: Westly Pam. 2008: CT A/P NO CIRRHOSIS AUG 2013 MRCP- DILATED CBD/NO CIRRHOSIS DEC 2013 AST 99-102  ALT 145-156 PLT 147 HB 13.4 ALB 4.4-4.7 T BIL 0.7   . Depression   . Dilation of biliary tract 07/14/2012   EUS JAN 2014 BENIGN CBD DILATION   . Epigastric pain 07/14/2012  . GERD (gastroesophageal reflux disease)   . Gout   . HCV (hepatitis C virus) 06/16/2015  . Hepatitis C 1979  . Hepatitis C reactive    LIVER BX 2008-CHRONIC ACTIVE HEPATITIS  . HTN (hypertension)   . Hx of adenomatous colonic polyps 2008   due for surveillance 2017  . Hypercholesterolemia   . IBS (irritable bowel syndrome)   . Irritable bowel syndrome 01/04/2009   Qualifier: Diagnosis of  By: Westly Pam.   . Knee pain   . Left forearm pain 08/15/2017  . Leukopenia 11/06/2015  . Narcotic addiction [facility]) 06/10/2011   [facility]- Oct 2012   . Normal cardiac stress test 05/25/2011   [facility]  . Normal echocardiogram 05/25/2011   EF 55%, mild TR  . RUQ pain 07/14/2012  . Substance abuse ([place])    narcotic addiction  . Thrombocytopenia ([place])  11/06/2015  . Trigger middle finger of left hand 06/20/2012    Past Surgical History:  Procedure Laterality Date  . APPENDECTOMY    . BALLOON DILATION N/A 05/28/2020   Procedure: BALLOON DILATION;  Surgeon: Eloise Harman, DO;  [facility];  Service: Endoscopy;  Laterality: N/A;  . BIOPSY  05/28/2020   Procedure: BIOPSY;  Surgeon: Eloise Harman, DO;  [facility];  Service: Endoscopy;;  . CARPAL TUNNEL RELEASE     rt  . COLONOSCOPY  2008 [facility] ARS D100 V8 PHEN 12.5   2 SIMPLE ADENOMAS (< 1 CM)  . COLONOSCOPY WITH PROPOFOL N/A 12/05/2019   TI normal, 3 mm polyp in ascending colon, external and internal hemorrhoids. Polyp removed but not retrieved. Poor prep  . ESOPHAGOGASTRODUODENOSCOPY  04/26/2012   Dr. Oneida Alar: Non-erosive gastritis (inflammation) was found in the gastric antrum but no H.pylori; multiple biopsies (duodenal bx negative for Celiac)/ The mucosa of the esophagus appeared normal  . ESOPHAGOGASTRODUODENOSCOPY (EGD) WITH PROPOFOL N/A 12/05/2019   mild gastritis, duodenitis due to aspirin/NSAIDs.   Marland Kitchen ESOPHAGOGASTRODUODENOSCOPY (EGD) WITH PROPOFOL N/A 05/28/2020   Procedure: ESOPHAGOGASTRODUODENOSCOPY (EGD) WITH PROPOFOL;  Surgeon: Eloise Harman, DO;  [facility];  Service: Endoscopy;  Laterality: N/A;  2:30pm  .  EUS  09/08/2012   Dr. Ardis Hughs: CBD dilated but no stones. Query secondary to Sphincter of Oddi stenosis, ?dysfunction, but clinically without symptoms  . FRACTURE SURGERY N/A    [employer]reesia 08/25/2020  . HARDWARE REMOVAL Right 02/12/2014   Procedure: HARDWARE REMOVAL;  Surgeon: Jolyn Nap, MD;  Location: [facility];  Service: Orthopedics;  Laterality: Right;  . KNEE ARTHROSCOPY    . Neurostimulator implant    . POLYPECTOMY  12/05/2019   Procedure: POLYPECTOMY;  Surgeon: Danie Binder, MD;  [facility];  Service: Endoscopy;;  . right ring finger    . spinal stenosis, had screws put in neck  04/11/2009    DR KRITZER  . TONSILLECTOMY    . ULNA OSTEOTOMY Right 02/12/2014   Procedure: RIGHT ULNAR SHORTENING AND OSTEOTOMY ;  Surgeon: Jolyn Nap, MD;  Location: [facility];  Service: Orthopedics;  Laterality: Right;  . ULNAR NERVE TRANSPOSITION    . UPPER GASTROINTESTINAL ENDOSCOPY  2008 SLF ABD PAIN WEIGHT LOSS d100 v8 phen 12.5   NL  . WRIST SURGERY Right jan 2015   Dr. Edmonia Lynch    Family History  Problem Relation Age of Onset  . Bladder Cancer Mother        rare form   . Cancer Sister        Metastatic abdominal  . Emotional abuse Sister   . Stroke Sister   . Emotional abuse Sister   . Heart failure Father   . Stroke Father   . Alcohol abuse Father   . Dementia Paternal Aunt   . Alcohol abuse Paternal Uncle   . Dementia Paternal Uncle   . Dementia Paternal Grandmother   . Stroke Paternal Grandmother   . ADD / ADHD Cousin   . Bipolar disorder Cousin   . Anxiety disorder Cousin   . Depression Cousin        Committed suicide  . Alcohol abuse Cousin   . OCD Cousin   . Paranoid behavior Cousin   . Brain cancer Maternal Grandfather   . Heart defect Other        FAMILY HX  . Colon cancer Maternal Uncle   . Colon cancer Cousin   . Colon polyps Neg Hx   . Drug abuse Neg Hx   . Schizophrenia Neg Hx   . Seizures Neg Hx   . Sexual abuse Neg Hx   . Physical abuse Neg Hx     Social History   Socioeconomic History  . Marital status: Divorced    Spouse name: Not on file  . Number of children: Not on file  . Years of education: Not on file  . Highest education level: Not on file  Occupational History  . Not on file  Tobacco Use  . Smoking status: Former Smoker    Packs/day: 1.50    Years: 8.00    Pack years: 12.00    Types: Cigarettes    Quit date: 08/13/1986    Years since quitting: 34.1  . Smokeless tobacco: Never Used  . Tobacco comment: quit 20 + yrs ago  Vaping Use  . Vaping Use: Never used  Substance and Sexual Activity  . Alcohol  use: Not Currently    Alcohol/week: 0.0 standard drinks    Comment: Used to drink heavily- went to treatment in May 2020.   Marland Kitchen Drug use: Not Currently    Comment: pain medications, klonopin- none in the last 17 months. Currently in  [facility]  . Sexual activity: Never  Other Topics Concern  . Not on file  Social History Narrative  . Not on file   Social Determinants of Health   Financial Resource Strain: Low Risk   . Difficulty of Paying Living Expenses: Not hard at all  Food Insecurity: No Food Insecurity  . Worried About Charity fundraiser in the Last Year: Never true  . Ran Out of Food in the Last Year: Never true  Transportation Needs: No Transportation Needs  . Lack of Transportation (Medical): No  . Lack of Transportation (Non-Medical): No  Physical Activity: Insufficiently Active  . Days of Exercise per Week: 3 days  . Minutes of Exercise per Session: 30 min  Stress: No Stress Concern Present  . Feeling of Stress : Only a little  Social Connections: Moderately Integrated  . Frequency of Communication with Friends and Family: More than three times a week  . Frequency of Social Gatherings with Friends and Family: More than three times a week  . Attends Religious Services: More than 4 times per year  . Active Member of Clubs or Organizations: Yes  . Attends Archivist Meetings: More than 4 times per year  . Marital Status: Divorced  Human resources officer Violence: Not At Risk  . Fear of Current or Ex-Partner: No  . Emotionally Abused: No  . Physically Abused: No  . Sexually Abused: No    Outpatient Medications Prior to Visit  Medication Sig Dispense Refill  . acetaminophen (TYLENOL) 500 MG tablet Take 1,000 mg by mouth every 8 (eight) hours as needed for moderate pain.     . Ascorbic Acid (VITAMIN C WITH ROSE HIPS) 500 MG tablet Take 500 mg by mouth daily.     Marland Kitchen aspirin EC 81 MG tablet Take 81 mg by mouth daily.    Marland Kitchen atorvastatin (LIPITOR) 10 MG tablet Take 1 tablet (10  mg total) by mouth daily. 30 tablet 3  . cloNIDine (CATAPRES) 0.1 MG tablet Take 1 tablet (0.1 mg total) by mouth daily. 30 tablet 1  . lansoprazole (PREVACID) 30 MG capsule Take 1 capsule (30 mg total) by mouth 2 (two) times daily before a meal. 60 capsule 3  . lubiprostone (AMITIZA) 24 MCG capsule Take 24 mcg by mouth 2 (two) times daily with a meal.    . mirtazapine (REMERON) 15 MG tablet TAKE (1) TABLET BY MOUTH AT BEDTIME. 30 tablet 6  . Nutritional Supplements (KETO PO) Take 1 tablet by mouth in the morning, at noon, and at bedtime.     . Probiotic Product (PROBIOTIC DAILY PO) Take by mouth daily.    Marland Kitchen senna (SENOKOT) 8.6 MG tablet Take 2 tablets by mouth daily.    . Turmeric 400 MG CAPS Take 800 mg by mouth daily.     . Vitamin D3 (VITAMIN D) 25 MCG tablet Take 4,000 Units by mouth daily.      No facility-administered medications prior to visit.    Allergies  Allergen Reactions  . Levofloxacin Nausea Only and Other (See Comments)    "Creepy" feeling, skin didn't feel right, sort of itchy.  . Ciprofloxacin     Sweating  . Sulfa Antibiotics     Review of Systems  Constitutional: Negative.   Respiratory: Negative.   Cardiovascular: Negative.   Gastrointestinal: Positive for abdominal pain. Negative for nausea and vomiting.       Objective:    Physical Exam Constitutional:      Appearance: Normal appearance.  Cardiovascular:     Rate and Rhythm: Normal rate and regular rhythm.     Pulses: Normal pulses.     Heart sounds: Normal heart sounds.  Pulmonary:     Effort: Pulmonary effort is normal.     Breath sounds: Normal breath sounds.  Neurological:     Mental Status: He is alert.  Psychiatric:        Mood and Affect: Mood normal.        Behavior: Behavior normal.        Thought Content: Thought content normal.        Judgment: Judgment normal.     BP 139/69   Pulse (!) 50   Temp 98 F (36.7 C)   Resp 20   Ht _0  (1.778 m)   Wt 133 lb (60.3 kg)   SpO2  98%   BMI 19.08 kg/m  Wt Readings from Last 3 Encounters:  10/15/20 133 lb (60.3 kg)  09/12/20 135 lb 3.2 oz (61.3 kg)  07/17/20 138 lb (62.6 kg)    Health Maintenance Due  Topic Date Due  . PNA vac Low Risk Adult (2 of 2 - PPSV23) 02/28/2020  . COVID-19 Vaccine (3 - Booster for Moderna series) 07/04/2020    There are no preventive care reminders to display for this patient.   Lab Results  Component Value Date   TSH 2.01 05/03/2019   Lab Results  Component Value Date   WBC 4.9 06/04/2020   HGB 14.2 06/04/2020   HCT 42.1 06/04/2020   MCV 92.9 06/04/2020   PLT 148 06/04/2020   Lab Results  Component Value Date   NA 142 06/04/2020   K 5.0 06/04/2020   CO2 26 06/04/2020   GLUCOSE 100 (H) 06/04/2020   BUN 18 06/04/2020   CREATININE 1.05 06/04/2020   BILITOT 0.5 06/04/2020   ALKPHOS 57 05/21/2020   AST 18 06/04/2020   ALT 16 06/04/2020   PROT 6.8 06/04/2020   ALBUMIN 4.1 05/21/2020   CALCIUM 10.2 06/04/2020   ANIONGAP 9 05/21/2020   Lab Results  Component Value Date   CHOL 211 (H) 06/04/2020   Lab Results  Component Value Date   HDL 55 06/04/2020   Lab Results  Component Value Date   LDLCALC 140 (H) 06/04/2020   Lab Results  Component Value Date   TRIG 70 06/04/2020   Lab Results  Component Value Date   CHOLHDL 3.8 06/04/2020   Lab Results  Component Value Date   HGBA1C 5.3 06/04/2020       Assessment & Plan:   Problem List Items Addressed This Visit      Cardiovascular and Mediastinum   Essential hypertension - Primary    -BP well controlled  -continue lisinopril and daily clonidine      Relevant Orders   CBC with Differential/Platelet   CMP14+EGFR   Lipid Panel With LDL/HDL Ratio     Digestive   GERD    -has upcoming appt with [facility]        Other   Hyperlipidemia    -fasting labs drawn today Lab Results  Component Value Date   CHOL 211 (H) 06/04/2020   HDL 55 06/04/2020   LDLCALC 140 (H) 06/04/2020   TRIG 70  06/04/2020   CHOLHDL 3.8 06/04/2020         Relevant Orders   CBC with Differential/Platelet   CMP14+EGFR   Lipid Panel With LDL/HDL Ratio       No orders of  the defined types were placed in this encounter.    Noreene Larsson, NP

## 2020-10-15 NOTE — Addendum Note (Signed)
Addended by: Darrall Dears on: 10/15/2020 03:27 PM   Modules accepted: Orders

## 2020-10-15 NOTE — Assessment & Plan Note (Signed)
-  fasting labs drawn today Lab Results  Component Value Date   CHOL 211 (H) 06/04/2020   HDL 55 06/04/2020   LDLCALC 140 (H) 06/04/2020   TRIG 70 06/04/2020   CHOLHDL 3.8 06/04/2020

## 2020-10-15 NOTE — Assessment & Plan Note (Signed)
-  has upcoming appt with [facility]

## 2020-10-15 NOTE — Patient Instructions (Addendum)
If you are age 67 or older, your body mass index should be between 23-30. Your Body mass index is 20.38 kg/m. If this is out of the aforementioned range listed, please consider follow up with your Primary Care Provider.  If you are age 79 or younger, your body mass index should be between 19-25. Your Body mass index is 20.38 kg/m. If this is out of the aformentioned range listed, please consider follow up with your Primary Care Provider.   We have sent the following medications to your pharmacy for you to pick up at your convenience:  Famotidine 20mg    You have been scheduled for an esophageal manometry test at [facility] on 10/21/20 at 12:30pm. Please arrive 30 minutes prior to your procedure for registration. You will need to go to outpatient registration (1st floor of the hospital) first. Make certain to bring your insurance cards as well as a complete list of medications.  Please remember the following:  1) Do not take any muscle relaxants, xanax (alprazolam) or ativan for 1 day prior to your test as well as the day of the test.  2) Nothing to eat or drink after 12:00 midnight on the night before your test.  3) Hold all diabetic medications/insulin the morning of the test. You may eat and take your medications after the test.  It will take at least 2 weeks to receive the results of this test from your physician.  ------------------------------------------ ABOUT ESOPHAGEAL MANOMETRY Esophageal manometry (muh-NOM-uh-tree) is a test that gauges how well your esophagus works. Your esophagus is the long, muscular tube that connects your throat to your stomach. Esophageal manometry measures the rhythmic muscle contractions (peristalsis) that occur in your esophagus when you swallow. Esophageal manometry also measures the coordination and force exerted by the muscles of your esophagus.   During esophageal manometry, a thin, flexible tube (catheter) that contains sensors is passed  through your nose, down your esophagus and into your stomach. Esophageal manometry can be helpful in diagnosing some mostly uncommon disorders that affect your esophagus.  Why it's done Esophageal manometry is used to evaluate the movement (motility) of food through the esophagus and into the stomach. The test measures how well the circular bands of muscle (sphincters) at the top and bottom of your esophagus open and close, as well as the pressure, strength and pattern of the wave of esophageal muscle contractions that moves food along.  What you can expect Esophageal manometry is an outpatient procedure done without sedation. Most people tolerate it well. You may be asked to change into a hospital gown before the test starts.  During esophageal manometry  . While you are sitting up, a member of your health care team sprays your throat with a numbing medication or puts numbing gel in your nose or both.  . A catheter is guided through your nose into your esophagus. The catheter may be sheathed in a water-filled sleeve. It doesn't interfere with your breathing. However, your eyes may water, and you may gag. You may have a slight nosebleed from irritation.  . After the catheter is in place, you may be asked to lie on your back on an exam table, or you may be asked to remain seated.  . You then swallow small sips of water. As you do, a computer connected to the catheter records the pressure, strength and pattern of your esophageal muscle contractions.  . During the test, you'll be asked to breathe slowly and smoothly, remain as still  as possible, and swallow only when you're asked to do so.  . A member of your health care team may move the catheter down into your stomach while the catheter continues its measurements.  . The catheter then is slowly withdrawn. The test usually lasts 20 to 30 minutes.  After esophageal manometry  When your esophageal manometry is complete, you may return to your normal  activities  This test typically takes 30-45 minutes to complete. ________________________________________________________________________________  Due to recent COVID-19 restrictions implemented by our local and state authorities and in an effort to keep both patients and staff as safe as possible, our hospital system now requires COVID-19 testing prior to any scheduled hospital procedure. Please go to [address], [place], [place] [address] on 10/18/20 at  . This is a drive up testing site, you will not need to exit your vehicle.  You will not be billed at the time of testing but may receive a bill later depending on your insurance. The approximate cost of the test is $100. You must agree to quarantine from the time of your testing until the procedure date on 10/21/20 . This should include staying at home with ONLY the people you live with. Avoid take-out, grocery store shopping or leaving the house for any non-emergent reason. Failure to have your COVID-19 test done on the date and time you have been scheduled will result in cancellation of procedure. Please call our office at [phone] if you have any questions.      Thank you for choosing me and [facility].  Ellouise Newer, PA-C

## 2020-10-15 NOTE — Progress Notes (Signed)
Chief Complaint: Dysphagia and reflux  HPI:    Marc [name] is a 65 year old male with a past medical history as listed below including reflux, who recently followed with [facility] gastroenterology, who presents to clinic today with a complaint of reflux and dysphagia.      09/12/2020 patient seen in clinic at [place] by Aliene Altes, PA and at that time was following up for reflux, dysphagia and constipation.  Was also noted he had a history of colon polyps, hemorrhoids and hep C genotype 1a, elastography F3/F4 status post treatment in the past with [facility] and documented SVR, posttreatment elastography with K PA 6.2 in February 2021.  Last colonoscopy April 2021 with poor prep, polyp removed but not retrieved, recommended repeat in 3 months (not yet completed), also discussed EGD October 21 for dysphagia with benign-appearing esophageal stenosis status post dilation, gastritis status post biopsy and normal examined duodenum.  Pathology negative for H. pylori.  At that time reported taking pantoprazole 40 mg twice daily but continue with daily reflux symptoms.  Was having BM every 2 to 3 days taking Linzess 290 mcg daily and 4 tablets of senna.  Dr. Robina Ade was spoken to who recommended pursuing manometry and pH impedance testing and did not feel repeat dilation would be beneficial.  At that time patient's Protonix was stopped and he was started on Prevacid 30 mg twice daily, discussed to try adding famotidine for continued breakthrough symptoms.  Recommended proceeding with esophageal pH impedance and manometry due to persistent GERD and dysphagia.  He was referred to speech pathology and [name] for esophageal pH impedance and manometry.    09/19/2020 swallowing function and speech pathology testing.  Swallow function was essentially within normal limits.  It was noted that he had history of drug and alcohol abuse and some autoimmune markers which could be contributing to his symptoms.  Was recommended that he  implement the effortful/hard swallow with solids and swallow 2 times for each sip.  Also recommended that he elevate the head of his bed.    Today, patient presents to clinic to be set up for his pH impedance and manometry as ordered by [facility].  He is having issues with uncontrollable reflux on Prevacid 30 mg twice daily.  Also continues with dysphagia issues, underwent a previous dilation with no real help in symptoms, recent swallow function as above.  Patient is ready to "have these things fixed".    Denies fever, chills, abdominal pain or real weight loss.  Past Medical History:  Diagnosis Date  . Acute encephalopathy 12/29/2017   Hospitalized 5/22 to 5/23 with acute encephalopathy, unclear etiology  . Acute gout involving toe of right foot 09/14/2019  . Allergy   . Anxiety   . Arthritis   . Back problem   . Chest wall pain 03/28/2017  . Chronic hepatitis C without hepatic coma ([place]) 01/04/2009   Qualifier: Diagnosis of  By: Westly Pam. 2008: CT A/P NO CIRRHOSIS AUG 2013 MRCP- DILATED CBD/NO CIRRHOSIS DEC 2013 AST 99-102  ALT 145-156 PLT 147 HB 13.4 ALB 4.4-4.7 T BIL 0.7   . Depression   . Dilation of biliary tract 07/14/2012   EUS JAN 2014 BENIGN CBD DILATION   . Epigastric pain 07/14/2012  . GERD (gastroesophageal reflux disease)   . Gout   . HCV (hepatitis C virus) 06/16/2015  . Hepatitis C 1979  . Hepatitis C reactive    LIVER BX 2008-CHRONIC ACTIVE HEPATITIS  . HTN (hypertension)   .  Hx of adenomatous colonic polyps 2008   due for surveillance 2017  . Hypercholesterolemia   . IBS (irritable bowel syndrome)   . Irritable bowel syndrome 01/04/2009   Qualifier: Diagnosis of  By: Westly Pam.   . Knee pain   . Left forearm pain 08/15/2017  . Leukopenia 11/06/2015  . Narcotic addiction [facility]) 06/10/2011   [facility]- Oct 2012   . Normal cardiac stress test 05/25/2011   [facility]  . Normal echocardiogram 05/25/2011   EF  55%, mild TR  . RUQ pain 07/14/2012  . Substance abuse ([place])    narcotic addiction  . Thrombocytopenia ([place]) 11/06/2015  . Trigger middle finger of left hand 06/20/2012    Past Surgical History:  Procedure Laterality Date  . APPENDECTOMY    . BALLOON DILATION N/A 05/28/2020   Procedure: BALLOON DILATION;  Surgeon: Eloise Harman, DO;  [facility];  Service: Endoscopy;  Laterality: N/A;  . BIOPSY  05/28/2020   Procedure: BIOPSY;  Surgeon: Eloise Harman, DO;  [facility];  Service: Endoscopy;;  . CARPAL TUNNEL RELEASE     rt  . COLONOSCOPY  2008 [facility] ARS D100 V8 PHEN 12.5   2 SIMPLE ADENOMAS (< 1 CM)  . COLONOSCOPY WITH PROPOFOL N/A 12/05/2019   TI normal, 3 mm polyp in ascending colon, external and internal hemorrhoids. Polyp removed but not retrieved. Poor prep  . ESOPHAGOGASTRODUODENOSCOPY  04/26/2012   Dr. Oneida Alar: Non-erosive gastritis (inflammation) was found in the gastric antrum but no H.pylori; multiple biopsies (duodenal bx negative for Celiac)/ The mucosa of the esophagus appeared normal  . ESOPHAGOGASTRODUODENOSCOPY (EGD) WITH PROPOFOL N/A 12/05/2019   mild gastritis, duodenitis due to aspirin/NSAIDs.   Marland Kitchen ESOPHAGOGASTRODUODENOSCOPY (EGD) WITH PROPOFOL N/A 05/28/2020   Procedure: ESOPHAGOGASTRODUODENOSCOPY (EGD) WITH PROPOFOL;  Surgeon: Eloise Harman, DO;  [facility];  Service: Endoscopy;  Laterality: N/A;  2:30pm  . EUS  09/08/2012   Dr. Ardis Hughs: CBD dilated but no stones. Query secondary to Sphincter of Oddi stenosis, ?dysfunction, but clinically without symptoms  . FRACTURE SURGERY N/A    Phreesia 08/25/2020  . HARDWARE REMOVAL Right 02/12/2014   Procedure: HARDWARE REMOVAL;  Surgeon: Jolyn Nap, MD;  Location: [facility];  Service: Orthopedics;  Laterality: Right;  . KNEE ARTHROSCOPY    . Neurostimulator implant    . POLYPECTOMY  12/05/2019   Procedure: POLYPECTOMY;  Surgeon: Danie Binder, MD;  [facility];  Service: Endoscopy;;  . right ring finger    . spinal stenosis, had screws put in neck  04/11/2009   DR KRITZER  . TONSILLECTOMY    . ULNA OSTEOTOMY Right 02/12/2014   Procedure: RIGHT ULNAR SHORTENING AND OSTEOTOMY ;  Surgeon: Jolyn Nap, MD;  Location: [facility];  Service: Orthopedics;  Laterality: Right;  . ULNAR NERVE TRANSPOSITION    . UPPER GASTROINTESTINAL ENDOSCOPY  2008 SLF ABD PAIN WEIGHT LOSS d100 v8 phen 12.5   NL  . WRIST SURGERY Right jan 2015   Dr. Edmonia Lynch    Current Outpatient Medications  Medication Sig Dispense Refill  . acetaminophen (TYLENOL) 500 MG tablet Take 1,000 mg by mouth every 8 (eight) hours as needed for moderate pain.     . Ascorbic Acid (VITAMIN C WITH ROSE HIPS) 500 MG tablet Take 500 mg by mouth daily.     Marland Kitchen aspirin EC 81 MG tablet Take 81 mg  by mouth daily.    Marland Kitchen atorvastatin (LIPITOR) 10 MG tablet Take 1 tablet (10 mg total) by mouth daily. 30 tablet 3  . cloNIDine (CATAPRES) 0.1 MG tablet Take 1 tablet (0.1 mg total) by mouth daily. 30 tablet 1  . lansoprazole (PREVACID) 30 MG capsule Take 1 capsule (30 mg total) by mouth 2 (two) times daily before a meal. 60 capsule 3  . lubiprostone (AMITIZA) 24 MCG capsule Take 24 mcg by mouth 2 (two) times daily with a meal.    . mirtazapine (REMERON) 15 MG tablet TAKE (1) TABLET BY MOUTH AT BEDTIME. 30 tablet 6  . Nutritional Supplements (KETO PO) Take 1 tablet by mouth in the morning, at noon, and at bedtime.     . Probiotic Product (PROBIOTIC DAILY PO) Take by mouth daily.    Marland Kitchen senna (SENOKOT) 8.6 MG tablet Take 2 tablets by mouth daily.    . Turmeric 400 MG CAPS Take 800 mg by mouth daily.     . Vitamin D3 (VITAMIN D) 25 MCG tablet Take 4,000 Units by mouth daily.      No current facility-administered medications for this visit.    Allergies as of 10/15/2020 - Review Complete 10/15/2020  Allergen Reaction Noted  . Levofloxacin Nausea Only and Other (See Comments)   .  Ciprofloxacin  06/13/2015  . Sulfa antibiotics  12/02/2012    Family History  Problem Relation Age of Onset  . Bladder Cancer Mother        rare form   . Cancer Sister        Metastatic abdominal  . Emotional abuse Sister   . Stroke Sister   . Emotional abuse Sister   . Heart failure Father   . Stroke Father   . Alcohol abuse Father   . Dementia Paternal Aunt   . Alcohol abuse Paternal Uncle   . Dementia Paternal Uncle   . Dementia Paternal Grandmother   . Stroke Paternal Grandmother   . ADD / ADHD Cousin   . Bipolar disorder Cousin   . Anxiety disorder Cousin   . Depression Cousin        Committed suicide  . Alcohol abuse Cousin   . OCD Cousin   . Paranoid behavior Cousin   . Brain cancer Maternal Grandfather   . Heart defect Other        FAMILY HX  . Colon cancer Maternal Uncle   . Colon cancer Cousin   . Colon polyps Neg Hx   . Drug abuse Neg Hx   . Schizophrenia Neg Hx   . Seizures Neg Hx   . Sexual abuse Neg Hx   . Physical abuse Neg Hx     Social History   Socioeconomic History  . Marital status: Divorced    Spouse name: Not on file  . Number of children: Not on file  . Years of education: Not on file  . Highest education level: Not on file  Occupational History  . Not on file  Tobacco Use  . Smoking status: Former Smoker    Packs/day: 1.50    Years: 8.00    Pack years: 12.00    Types: Cigarettes    Quit date: 08/13/1986    Years since quitting: 34.1  . Smokeless tobacco: Never Used  . Tobacco comment: quit 20 + yrs ago  Vaping Use  . Vaping Use: Never used  Substance and Sexual Activity  . Alcohol use: Not Currently    Alcohol/week: 0.0 standard  drinks    Comment: Used to drink heavily- went to treatment in May 2020.   Marland Kitchen Drug use: Not Currently    Comment: pain medications, klonopin- none in the last 17 months. Currently in [place]  . Sexual activity: Never  Other Topics Concern  . Not on file  Social History Narrative  . Not on file    Social Determinants of Health   Financial Resource Strain: Low Risk   . Difficulty of Paying Living Expenses: Not hard at all  Food Insecurity: No Food Insecurity  . Worried About Charity fundraiser in the Last Year: Never true  . Ran Out of Food in the Last Year: Never true  Transportation Needs: No Transportation Needs  . Lack of Transportation (Medical): No  . Lack of Transportation (Non-Medical): No  Physical Activity: Insufficiently Active  . Days of Exercise per Week: 3 days  . Minutes of Exercise per Session: 30 min  Stress: No Stress Concern Present  . Feeling of Stress : Only a little  Social Connections: Moderately Integrated  . Frequency of Communication with Friends and Family: More than three times a week  . Frequency of Social Gatherings with Friends and Family: More than three times a week  . Attends Religious Services: More than 4 times per year  . Active Member of Clubs or Organizations: Yes  . Attends Archivist Meetings: More than 4 times per year  . Marital Status: Divorced  Human resources officer Violence: Not At Risk  . Fear of Current or Ex-Partner: No  . Emotionally Abused: No  . Physically Abused: No  . Sexually Abused: No    Review of Systems:    Constitutional: No weight loss, fever or chills Cardiovascular: No chest pain Respiratory: No SOB Gastrointestinal: See HPI and otherwise negative   Physical Exam:  Vital signs: BP 130/70 (BP Location: Left Arm, Patient Position: Sitting, Cuff Size: Normal)   Pulse (!) 56   Ht 5' 8.25" (1.734 m) Comment: height measured without shoes  Wt 135 lb (61.2 kg)   BMI 20.38 kg/m   Constitutional:   Pleasant Caucasian male appears to be in NAD, Well developed, Well nourished, alert and cooperative Respiratory: Respirations even and unlabored. Lungs clear to auscultation bilaterally.   No wheezes, crackles, or rhonchi.  Cardiovascular: Normal S1, S2. No MRG. Regular rate and rhythm. No peripheral edema,  cyanosis or pallor.  Gastrointestinal:  Soft, nondistended, nontender. No rebound or guarding. Normal bowel sounds. No appreciable masses or hepatomegaly. Rectal:  Not performed.  Psychiatric: Demonstrates good judgement and reason without abnormal affect or behaviors.  See HPI for recent imaging.  Assessment: 1.  GERD: Continues regardless of switching PPIs in the past 2.  Dysphagia: See HPI for details, previous dilation was unhelpful  Plan: 1.  Scheduled patient for esophageal manometry and pH impedance testing under Dr. Silverio Decamp at the hospital. 2.  Did add Famotidine 20 mg every morning and nightly today, prescribed #60 with 5 refills. 3.  After results from above patient will return to [facility] for follow-up.  Marc Newer, Marc [name] [facility] 10/15/2020, 2:32 PM  Cc: Marc Helper, MD

## 2020-10-15 NOTE — Assessment & Plan Note (Signed)
-  BP well controlled  -continue lisinopril and daily clonidine

## 2020-10-16 ENCOUNTER — Telehealth: Payer: Self-pay | Admitting: Gastroenterology

## 2020-10-16 ENCOUNTER — Other Ambulatory Visit: Payer: Self-pay | Admitting: Family Medicine

## 2020-10-16 ENCOUNTER — Ambulatory Visit: Payer: Medicare HMO | Admitting: Physician Assistant

## 2020-10-16 DIAGNOSIS — E785 Hyperlipidemia, unspecified: Secondary | ICD-10-CM

## 2020-10-16 LAB — CMP14+EGFR
ALT: 67 IU/L — ABNORMAL HIGH (ref 0–44)
AST: 55 IU/L — ABNORMAL HIGH (ref 0–40)
Albumin/Globulin Ratio: 2.5 — ABNORMAL HIGH (ref 1.2–2.2)
Albumin: 4.7 g/dL (ref 3.8–4.8)
Alkaline Phosphatase: 80 IU/L (ref 44–121)
BUN/Creatinine Ratio: 29 — ABNORMAL HIGH (ref 10–24)
BUN: 25 mg/dL (ref 8–27)
Bilirubin Total: 0.4 mg/dL (ref 0.0–1.2)
CO2: 24 mmol/L (ref 20–29)
Calcium: 9.6 mg/dL (ref 8.6–10.2)
Chloride: 106 mmol/L (ref 96–106)
Creatinine, Ser: 0.85 mg/dL (ref 0.76–1.27)
Globulin, Total: 1.9 g/dL (ref 1.5–4.5)
Glucose: 85 mg/dL (ref 65–99)
Potassium: 4.3 mmol/L (ref 3.5–5.2)
Sodium: 144 mmol/L (ref 134–144)
Total Protein: 6.6 g/dL (ref 6.0–8.5)
eGFR: 95 mL/min/{1.73_m2} (ref >59)

## 2020-10-16 LAB — CBC WITH DIFFERENTIAL/PLATELET
Basophils Absolute: 0 10*3/uL (ref 0.0–0.2)
Basos: 1 %
EOS (ABSOLUTE): 0.2 10*3/uL (ref 0.0–0.4)
Eos: 4 %
Hematocrit: 39.2 % (ref 37.5–51.0)
Hemoglobin: 13.7 g/dL (ref 13.0–17.7)
Immature Grans (Abs): 0 10*3/uL (ref 0.0–0.1)
Immature Granulocytes: 0 %
Lymphocytes Absolute: 1.3 10*3/uL (ref 0.7–3.1)
Lymphs: 33 %
MCH: 32 pg (ref 26.6–33.0)
MCHC: 34.9 g/dL (ref 31.5–35.7)
MCV: 92 fL (ref 79–97)
Monocytes Absolute: 0.3 10*3/uL (ref 0.1–0.9)
Monocytes: 7 %
Neutrophils Absolute: 2.2 10*3/uL (ref 1.4–7.0)
Neutrophils: 55 %
Platelets: 140 10*3/uL — ABNORMAL LOW (ref 150–450)
RBC: 4.28 x10E6/uL (ref 4.14–5.80)
RDW: 12 % (ref 11.6–15.4)
WBC: 4 10*3/uL (ref 3.4–10.8)

## 2020-10-16 LAB — LIPID PANEL WITH LDL/HDL RATIO
Cholesterol, Total: 150 mg/dL (ref 100–199)
HDL: 68 mg/dL (ref >39)
LDL Chol Calc (NIH): 74 mg/dL (ref 0–99)
LDL/HDL Ratio: 1.1 ratio (ref 0.0–3.6)
Triglycerides: 34 mg/dL (ref 0–149)
VLDL Cholesterol Cal: 8 mg/dL (ref 5–40)

## 2020-10-16 NOTE — Telephone Encounter (Signed)
The pt has been re instructed for manometry results. All questions answered.  He was advised to call back if he has any further concerns.

## 2020-10-16 NOTE — Telephone Encounter (Signed)
Pt is requesting a call back from a nurse to discuss his procedure scheduled at the hospital for 3/14.

## 2020-10-16 NOTE — Progress Notes (Signed)
His labs look good. His liver function tests are a little elevated, but not enough to be concerning. We will watch these with routine lab work.

## 2020-10-17 IMAGING — DX LEFT ANKLE COMPLETE - 3+ VIEW
3 series · 3 of 3 positions shown · non-contrast
Comparison: 10/23/2008

CLINICAL DATA: 63-year-old male with a history of left foot and
ankle pain with swelling and bruising. No known trauma

EXAM:
LEFT ANKLE COMPLETE - 3+ VIEW

[ankle ap]
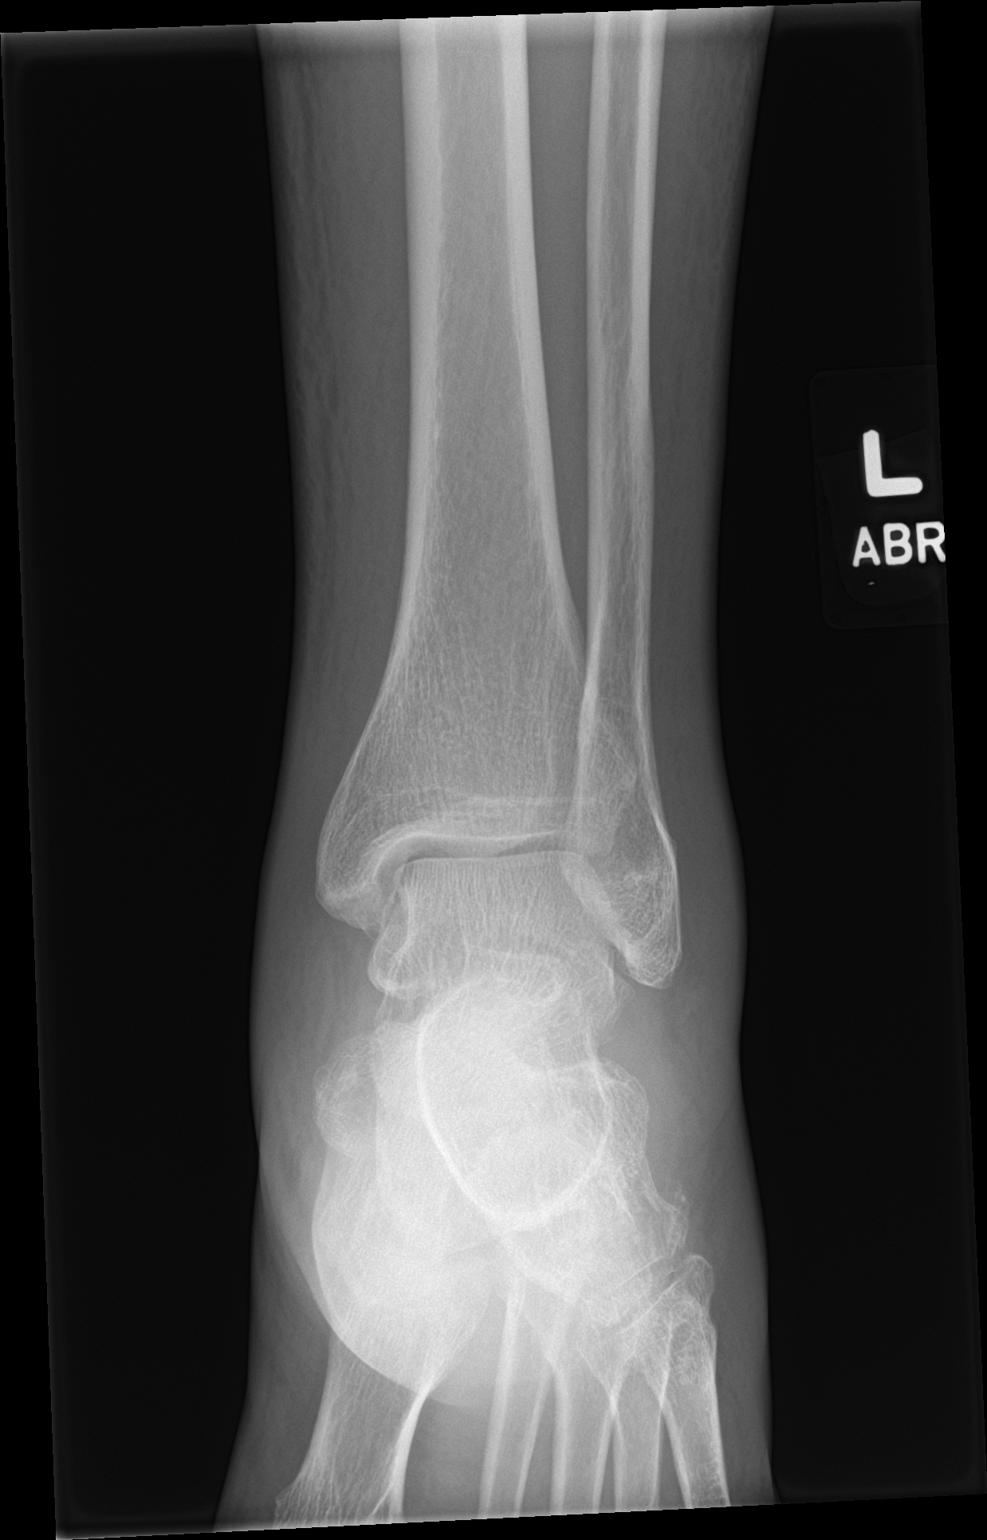

[ankle obl]
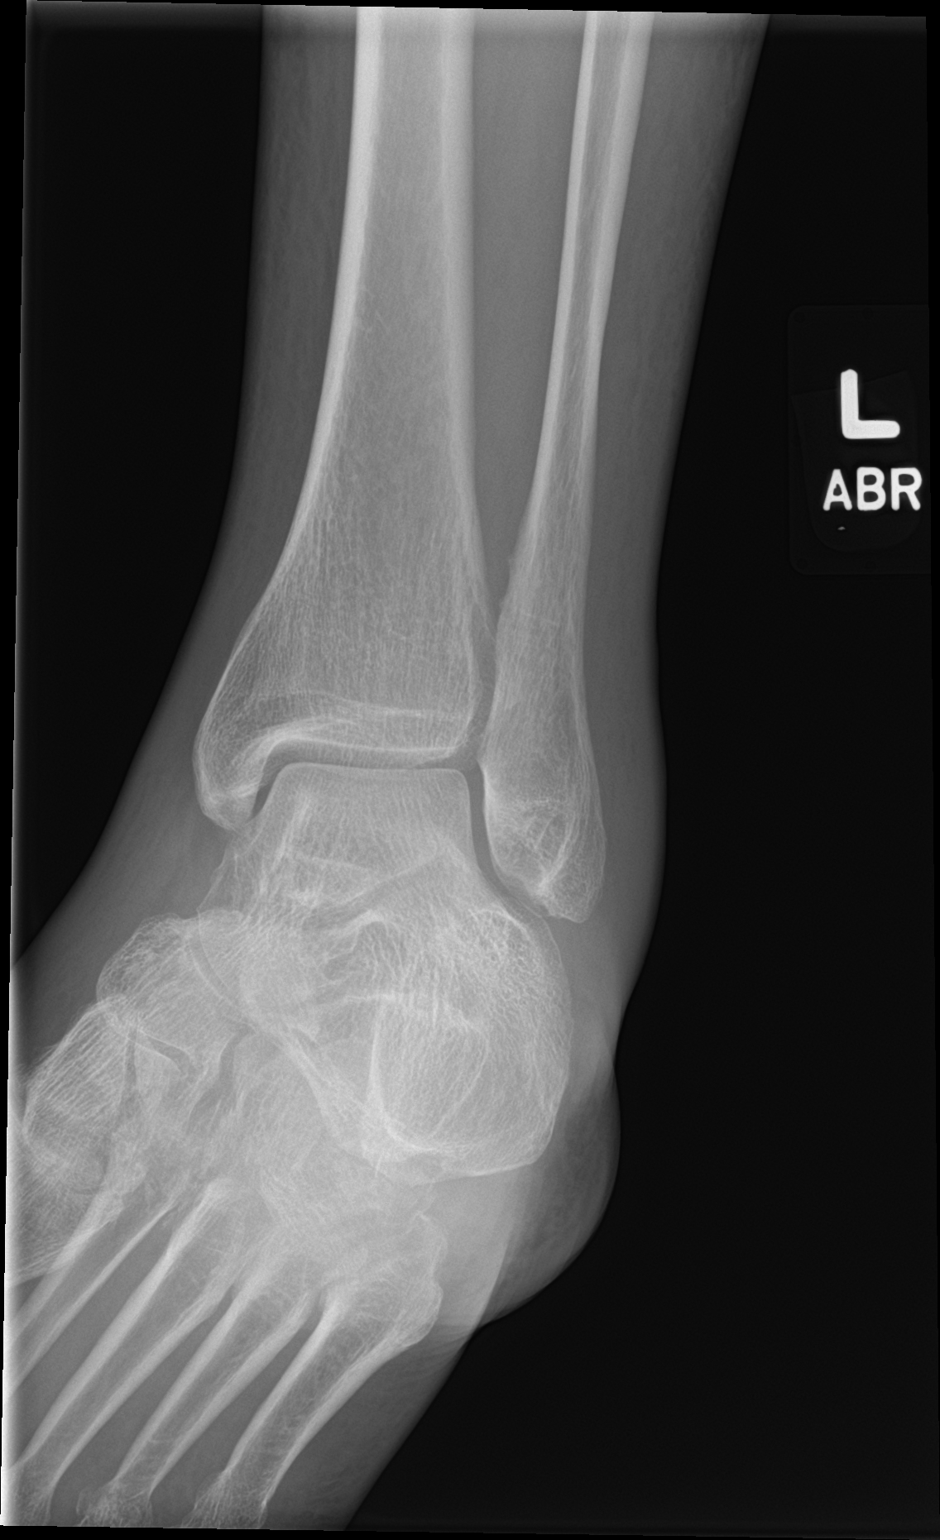

[ankle lat]
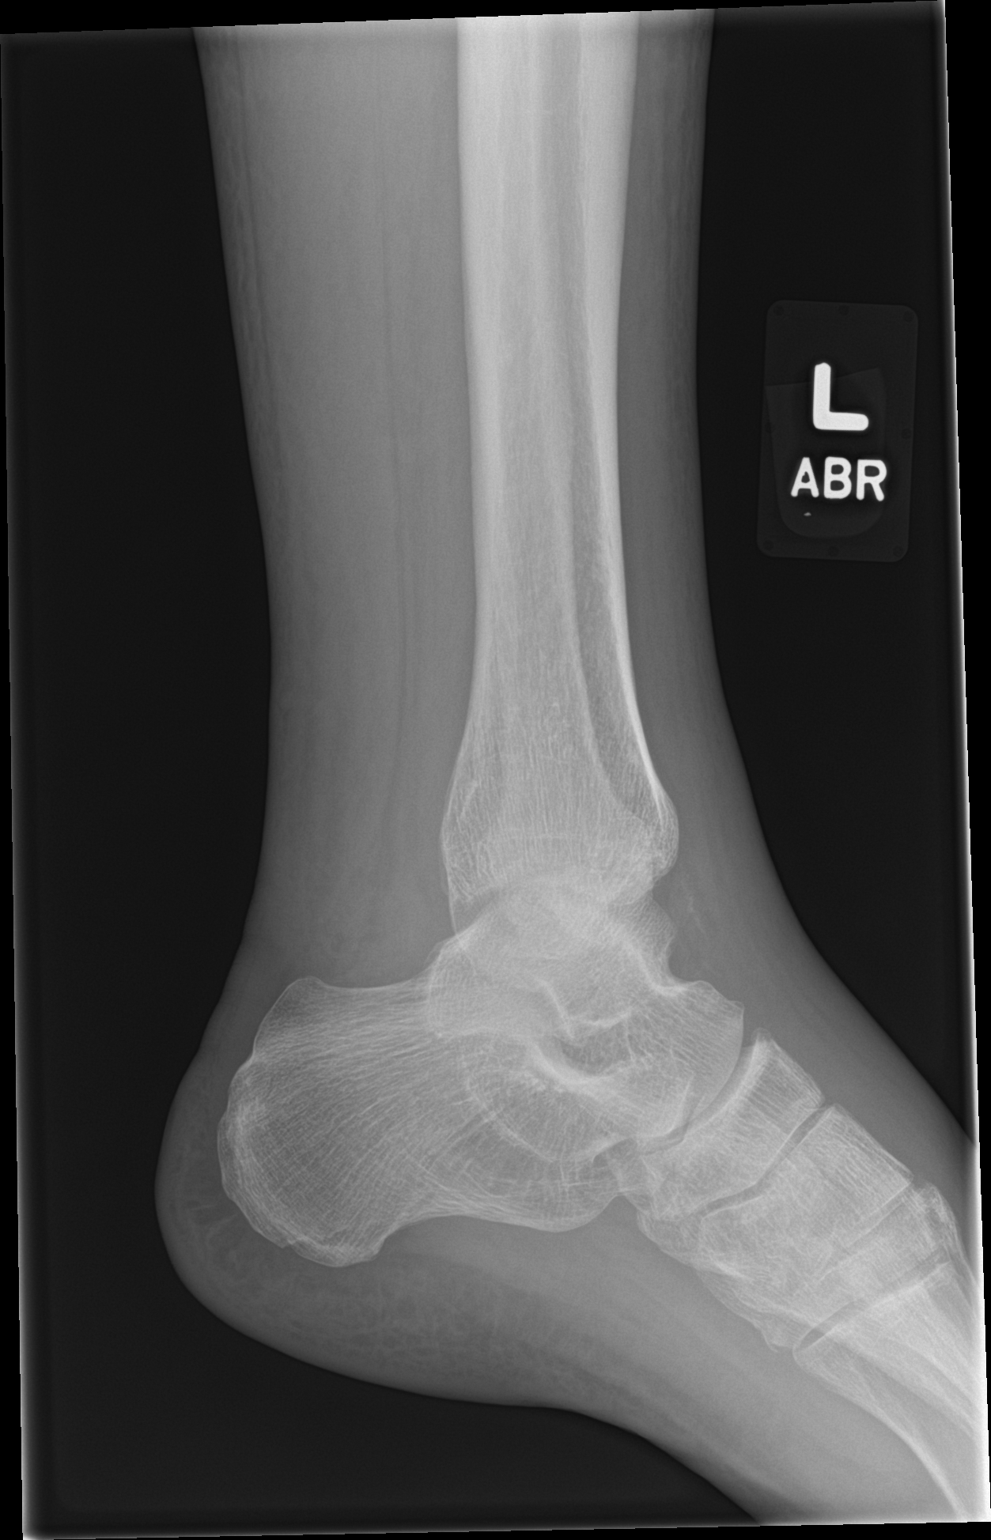

[3 of 3 positions shown; findings below may reference images not displayed]

FINDINGS: No acute displaced fracture. Ankle mortise congruent. No joint
effusion. Circumferential soft tissue swelling of the ankle in the
lower leg. Lateral view demonstrates chronic calcifications anterior
to the tibiotalar joint. No radiopaque foreign body.
IMPRESSION: Negative for acute bony abnormality.

Nonspecific soft tissue swelling of the leg and ankle.

## 2020-10-18 ENCOUNTER — Other Ambulatory Visit (HOSPITAL_COMMUNITY)
Admission: RE | Admit: 2020-10-18 | Discharge: 2020-10-18 | Disposition: A | Discharge status: Home / Self Care | Payer: Medicare HMO | Source: Ambulatory Visit | Attending: Gastroenterology | Admitting: Gastroenterology

## 2020-10-18 DIAGNOSIS — Z01812 Encounter for preprocedural laboratory examination: Secondary | ICD-10-CM | POA: Insufficient documentation

## 2020-10-18 DIAGNOSIS — Z20822 Contact with and (suspected) exposure to covid-19: Secondary | ICD-10-CM | POA: Diagnosis not present

## 2020-10-19 LAB — SARS CORONAVIRUS 2 (TAT 6-24 HRS): SARS Coronavirus 2: NEGATIVE

## 2020-10-21 ENCOUNTER — Ambulatory Visit (HOSPITAL_COMMUNITY)
Admission: RE | Admit: 2020-10-21 | Discharge: 2020-10-21 | Disposition: A | Discharge status: Home / Self Care | Payer: Medicare HMO | Attending: Gastroenterology | Admitting: Gastroenterology

## 2020-10-21 ENCOUNTER — Encounter (HOSPITAL_COMMUNITY)
Admission: RE | Disposition: A | Discharge status: Home / Self Care | Payer: Self-pay | Source: Home / Self Care | Attending: Gastroenterology

## 2020-10-21 DIAGNOSIS — R1310 Dysphagia, unspecified: Secondary | ICD-10-CM

## 2020-10-21 DIAGNOSIS — R1110 Vomiting, unspecified: Secondary | ICD-10-CM

## 2020-10-21 DIAGNOSIS — K219 Gastro-esophageal reflux disease without esophagitis: Secondary | ICD-10-CM

## 2020-10-21 HISTORY — PX: 24 HOUR PH STUDY: SHX5419

## 2020-10-21 HISTORY — PX: ESOPHAGEAL MANOMETRY: SHX5429

## 2020-10-21 SURGERY — MANOMETRY, ESOPHAGUS

## 2020-10-21 MED ORDER — LIDOCAINE VISCOUS HCL 2 % MT SOLN
OROMUCOSAL | Status: AC
  Filled 2020-10-21: qty 15

## 2020-10-21 SURGICAL SUPPLY — 2 items
FACESHIELD LNG OPTICON STERILE (SAFETY) IMPLANT
GLOVE BIO SURGEON STRL SZ8 (GLOVE) ×6 IMPLANT

## 2020-10-22 ENCOUNTER — Encounter (HOSPITAL_COMMUNITY): Payer: Self-pay | Admitting: Gastroenterology

## 2020-10-22 DIAGNOSIS — R1110 Vomiting, unspecified: Secondary | ICD-10-CM

## 2020-10-30 ENCOUNTER — Ambulatory Visit: Payer: Medicare HMO | Admitting: Internal Medicine

## 2020-10-30 ENCOUNTER — Telehealth: Payer: Self-pay

## 2020-10-30 ENCOUNTER — Encounter: Payer: Self-pay | Admitting: Internal Medicine

## 2020-10-30 ENCOUNTER — Other Ambulatory Visit: Payer: Self-pay

## 2020-10-30 VITALS — BP 116/62 | HR 49 | Temp 97.3°F | Ht 68.0 in | Wt 137.0 lb

## 2020-10-30 DIAGNOSIS — K219 Gastro-esophageal reflux disease without esophagitis: Secondary | ICD-10-CM | POA: Diagnosis not present

## 2020-10-30 DIAGNOSIS — R1310 Dysphagia, unspecified: Secondary | ICD-10-CM

## 2020-10-30 DIAGNOSIS — K5900 Constipation, unspecified: Secondary | ICD-10-CM

## 2020-10-30 MED ORDER — BACLOFEN 5 MG PO TABS: 5.0000 mg | ORAL_TABLET | Freq: Two times a day (BID) | ORAL | 3 refills | Status: DC

## 2020-10-30 NOTE — Telephone Encounter (Signed)
That's great!! [phone].  Thank you!

## 2020-10-30 NOTE — Telephone Encounter (Signed)
Almyra Free I have the pH study what is your fax number I can get it to you?

## 2020-10-30 NOTE — Patient Instructions (Signed)
We will trial you on baclofen 5 mg twice daily before meals.  Continue on lansoprazole and famotidine as well.  I will await pH testing results.  Follow-up in 6 to 8 weeks.  At [facility] we value your feedback. You may receive a survey about your visit today. Please share your experience as we strive to create trusting relationships with our patients to provide genuine, compassionate, quality care.  We appreciate your understanding and patience as we review any laboratory studies, imaging, and other diagnostic tests that are ordered as we care for you. Our office policy is 5 business days for review of these results, and any emergent or urgent results are addressed in a timely manner for your best interest. If you do not hear from our office in 1 week, please contact us.   We also encourage the use of MyChart, which contains your medical information for your review as well. If you are not enrolled in this feature, an access code is on this after visit summary for your convenience. Thank you for allowing [name] to be involved in your care.  It was great to see you today!  I hope you have a great rest of your spring!!    Elon Alas. Abbey Chatters, D.O. Gastroenterology and Hepatology [facility]

## 2020-10-30 NOTE — Telephone Encounter (Signed)
I just faxed it over. Let me know if you have any further questions.

## 2020-10-30 NOTE — Telephone Encounter (Signed)
-----   Message from Mauri Pole, MD sent at 10/30/2020  1:10 PM EDT ----- It will be scanned in to Epic soon. Ph data is downloaded after the patient returns the recorder, its not done at the same time as manometry. He has increased weakly acid reflux episodes.  Rebecca Cairns, please fax the copies to Dr Abbey Chatters. Thanks ----- Message ----- From: Timothy Lasso, RN Sent: 10/30/2020  10:27 AM EDT To: Claudina Lick, LPN, Mauri Pole, MD  I am sorry I do not see it either. It may be available now.  I will send to Dr Silverio Decamp  ----- Message ----- From: Claudina Lick, LPN Sent: 6/72/0919   9:59 AM EDT To: Timothy Lasso, RN  Good morning Maeven Mcdougall!  This pt is here today to see Dr.Carver and we are looking for the Ph report and cannot seem to find it. Can you tell me where it is?

## 2020-10-30 NOTE — Telephone Encounter (Signed)
We got it! Thanks for your help!

## 2020-10-30 NOTE — Progress Notes (Signed)
Referring Provider: Fayrene Helper, MD Primary Care Physician:  Fayrene Helper, MD Primary GI:  Dr. Abbey Chatters  Chief Complaint  Patient presents with  . Dysphagia    Difficulties with food   . Gastroesophageal Reflux    HPI:   Marc [name] is a 65 y.o. male who presents to clinic today for follow-up visit.  He has a complicated past GI history.  We are currently following him for chronic GERD, dysphagia, constipation. Also with history of colon polyps, hemorrhoids, and hep C genotype 1a, elastography F3/F4 s/p treatment in the past with Harvoni and documented SVR. Post treatment elastography with kPa6.2 in February 2021.     Last colonoscopy in April 2021 with poor prep, polyp removed but not retrieved.  Recommended repeat colonoscopy in 3 months.  This has yet to be completed.  Last EGD in October 2021 for dysphagia with benign-appearing esophageal stenosis s/p dilation, gastritis s/p biopsy, normal examined duodenum.  Pathology negative H. Pylori.  Patient's main issue has been ongoing dysphagia.  He has had extensive work-up including esophagram, modified barium, ENT work-up including laryngoscopy.  He has tried and failed basically every PPI including pantoprazole, Nexium, Dexilant.  Currently on Prevacid 30 mg twice daily with mild improvement.  He underwent EGD 05/28/2020 with dilation without improvement in symptoms.  Diagnostic showed H. pylori negative gastritis.  He recently underwent esophageal manometry last week which was normal.  We are awaiting pH impedance testing results.  Constipation well controlled on Amitiza 24 mg twice daily.  Today, patient continues to complain of ongoing dysphagia and GERD.  Past Medical History:  Diagnosis Date  . Acute encephalopathy 12/29/2017   Hospitalized 5/22 to 5/23 with acute encephalopathy, unclear etiology  . Acute gout involving toe of right foot 09/14/2019  . Alcoholism ([place])   . Allergy   . Anxiety   . Arthritis    . Back problem   . Chest wall pain 03/28/2017  . Chronic hepatitis C without hepatic coma ([place]) 01/04/2009   Qualifier: Diagnosis of  By: Westly Pam. 2008: CT A/P NO CIRRHOSIS AUG 2013 MRCP- DILATED CBD/NO CIRRHOSIS DEC 2013 AST 99-102  ALT 145-156 PLT 147 HB 13.4 ALB 4.4-4.7 T BIL 0.7   . Depression   . Dilation of biliary tract 07/14/2012   EUS JAN 2014 BENIGN CBD DILATION   . Epigastric pain 07/14/2012  . GERD (gastroesophageal reflux disease)   . Gout   . HCV (hepatitis C virus) 06/16/2015  . Hepatitis B   . Hepatitis C 1979  . Hepatitis C reactive    LIVER BX 2008-CHRONIC ACTIVE HEPATITIS  . HTN (hypertension)   . Hx of adenomatous colonic polyps 2008   due for surveillance 2017  . Hypercholesterolemia   . IBS (irritable bowel syndrome)   . Irritable bowel syndrome 01/04/2009   Qualifier: Diagnosis of  By: Westly Pam.   . Knee pain   . Left forearm pain 08/15/2017  . Leukopenia 11/06/2015  . Lichen planus   . Narcotic addiction [facility]) 06/10/2011   [facility]- Oct 2012   . Normal cardiac stress test 05/25/2011   [facility] [place]  . Normal echocardiogram 05/25/2011   EF 55%, mild TR  . Plaque psoriasis   . RUQ pain 07/14/2012  . Status post dilation of esophageal narrowing   . Substance abuse ([place])    narcotic addiction  . Thrombocytopenia ([place]) 11/06/2015  . Trigger middle finger of  left hand 06/20/2012    Past Surgical History:  Procedure Laterality Date  . [place] STUDY  10/21/2020   Procedure: [place] STUDY;  Surgeon: Mauri Pole, MD;  Location: [facility];  Service: Endoscopy;;  . APPENDECTOMY    . BALLOON DILATION N/A 05/28/2020   Procedure: BALLOON DILATION;  Surgeon: Eloise Harman, DO;  [facility];  Service: Endoscopy;  Laterality: N/A;  . BIOPSY  05/28/2020   Procedure: BIOPSY;  Surgeon: Eloise Harman, DO;  [facility];  Service: Endoscopy;;  . CARPAL TUNNEL RELEASE      rt  . COLONOSCOPY  2008 [facility] ARS D100 V8 PHEN 12.5   2 SIMPLE ADENOMAS (< 1 CM)  . COLONOSCOPY WITH PROPOFOL N/A 12/05/2019   TI normal, 3 mm polyp in ascending colon, external and internal hemorrhoids. Polyp removed but not retrieved. Poor prep  . ESOPHAGEAL MANOMETRY N/A 10/21/2020   Procedure: ESOPHAGEAL MANOMETRY (EM);  Surgeon: Mauri Pole, MD;  Location: [facility];  Service: Endoscopy;  Laterality: N/A;  . ESOPHAGOGASTRODUODENOSCOPY  04/26/2012   Dr. Oneida Alar: Non-erosive gastritis (inflammation) was found in the gastric antrum but no H.pylori; multiple biopsies (duodenal bx negative for Celiac)/ The mucosa of the esophagus appeared normal  . ESOPHAGOGASTRODUODENOSCOPY (EGD) WITH PROPOFOL N/A 12/05/2019   mild gastritis, duodenitis due to aspirin/NSAIDs.   Marland Kitchen ESOPHAGOGASTRODUODENOSCOPY (EGD) WITH PROPOFOL N/A 05/28/2020   Procedure: ESOPHAGOGASTRODUODENOSCOPY (EGD) WITH PROPOFOL;  Surgeon: Eloise Harman, DO;  [facility];  Service: Endoscopy;  Laterality: N/A;  2:30pm  . EUS  09/08/2012   Dr. Ardis Hughs: CBD dilated but no stones. Query secondary to Sphincter of Oddi stenosis, ?dysfunction, but clinically without symptoms  . FRACTURE SURGERY N/A    Phreesia 08/25/2020  . HARDWARE REMOVAL Right 02/12/2014   Procedure: HARDWARE REMOVAL;  Surgeon: Jolyn Nap, MD;  Location: [place];  Service: Orthopedics;  Laterality: Right;  . KNEE ARTHROSCOPY    . Neurostimulator implant    . POLYPECTOMY  12/05/2019   Procedure: POLYPECTOMY;  Surgeon: Danie Binder, MD;  [facility];  Service: Endoscopy;;  . right ring finger    . spinal stenosis, had screws put in neck  04/11/2009   DR KRITZER  . TONSILLECTOMY    . ULNA OSTEOTOMY Right 02/12/2014   Procedure: RIGHT ULNAR SHORTENING AND OSTEOTOMY ;  Surgeon: Jolyn Nap, MD;  Location: [place];  Service: Orthopedics;  Laterality: Right;  . ULNAR NERVE TRANSPOSITION    . UPPER  GASTROINTESTINAL ENDOSCOPY  2008 SLF ABD PAIN WEIGHT LOSS d100 v8 phen 12.5   NL  . WRIST SURGERY Right jan 2015   Dr. Edmonia Lynch    Current Outpatient Medications  Medication Sig Dispense Refill  . AMBULATORY NON FORMULARY MEDICATION Kratom Take 2 tablet by mouth twice daily    . AMBULATORY NON FORMULARY MEDICATION Lavena Stanford caffiene 1-2 tablet twice daily    . Ascorbic Acid (VITAMIN C WITH ROSE HIPS) 500 MG tablet Take 500 mg by mouth daily.     Marland Kitchen aspirin EC 81 MG tablet Take 81 mg by mouth daily.    Marland Kitchen atorvastatin (LIPITOR) 10 MG tablet TAKE (1) TABLET BY MOUTH ONCE DAILY. 30 tablet 0  . cloNIDine (CATAPRES) 0.1 MG tablet Take 1 tablet (0.1 mg total) by mouth daily. 30 tablet 1  . famotidine (PEPCID) 20 MG tablet Take 1 tablet (20 mg total) by mouth 2 (two) times daily.  Take one tablet in the morning and one tablet at bedtime 60 tablet 5  . ibuprofen (ADVIL) 200 MG tablet Take 400 mg by mouth 2 (two) times daily.    . lansoprazole (PREVACID) 30 MG capsule Take 1 capsule (30 mg total) by mouth 2 (two) times daily before a meal. 60 capsule 3  . lubiprostone (AMITIZA) 24 MCG capsule Take 24 mcg by mouth 2 (two) times daily with a meal.    . mirtazapine (REMERON) 15 MG tablet TAKE (1) TABLET BY MOUTH AT BEDTIME. 30 tablet 6  . Multiple Vitamin (MULTIVITAMIN WITH MINERALS) TABS tablet Take 1 tablet by mouth daily.    . Nutritional Supplements (KETO PO) Take 1 tablet by mouth in the morning, at noon, and at bedtime.     . Probiotic Product (PROBIOTIC DAILY PO) Take by mouth daily.    Marland Kitchen senna (SENOKOT) 8.6 MG tablet Take 2 tablets by mouth daily.    . Turmeric 400 MG CAPS Take 800 mg by mouth daily.     . Vitamin D3 (VITAMIN D) 25 MCG tablet Take 4,000 Units by mouth daily.     Marland Kitchen acetaminophen (TYLENOL) 500 MG tablet Take 1,000 mg by mouth every 8 (eight) hours as needed for moderate pain.  (Patient not taking: Reported on 10/30/2020)     No current facility-administered medications for this  visit.    Allergies as of 10/30/2020 - Review Complete 10/30/2020  Allergen Reaction Noted  . Levofloxacin Nausea Only and Other (See Comments)   . Ciprofloxacin  06/13/2015  . Sulfa antibiotics  12/02/2012    Family History  Problem Relation Age of Onset  . Bladder Cancer Mother        rare form   . Cancer Sister        Metastatic abdominal  . Emotional abuse Sister   . Stroke Sister   . Emotional abuse Sister   . Heart failure Father   . Stroke Father   . Alcohol abuse Father   . Dementia Paternal Aunt   . Alcohol abuse Paternal Uncle   . Dementia Paternal Uncle   . Dementia Paternal Grandmother   . Stroke Paternal Grandmother   . ADD / ADHD Cousin   . Bipolar disorder Cousin   . Anxiety disorder Cousin   . Depression Cousin        Committed suicide  . Alcohol abuse Cousin   . OCD Cousin   . Paranoid behavior Cousin   . Brain cancer Maternal Grandfather   . Heart defect Other        FAMILY HX  . Colon cancer Maternal Uncle   . Colon cancer Cousin   . Colon polyps Neg Hx   . Drug abuse Neg Hx   . Schizophrenia Neg Hx   . Seizures Neg Hx   . Sexual abuse Neg Hx   . Physical abuse Neg Hx     Social History   Socioeconomic History  . Marital status: Divorced    Spouse name: Not on file  . Number of children: 1  . Years of education: Not on file  . Highest education level: Not on file  Occupational History  . Occupation: Disabled/retired  Tobacco Use  . Smoking status: Former Smoker    Packs/day: 1.50    Years: 8.00    Pack years: 12.00    Types: Cigarettes    Quit date: 08/13/1986    Years since quitting: 34.2  . Smokeless tobacco: Never Used  .  Tobacco comment: quit 20 + yrs ago  Vaping Use  . Vaping Use: Never used  Substance and Sexual Activity  . Alcohol use: Not Currently    Alcohol/week: 0.0 standard drinks    Comment: Used to drink heavily- went to treatment in May 2020.   Marland Kitchen Drug use: Not Currently    Comment: pain medications, klonopin-  none in the last 17 months. Currently in [place]  . Sexual activity: Never  Other Topics Concern  . Not on file  Social History Narrative  . Not on file   Social Determinants of Health   Financial Resource Strain: Low Risk   . Difficulty of Paying Living Expenses: Not hard at all  Food Insecurity: No Food Insecurity  . Worried About Charity fundraiser in the Last Year: Never true  . Ran Out of Food in the Last Year: Never true  Transportation Needs: No Transportation Needs  . Lack of Transportation (Medical): No  . Lack of Transportation (Non-Medical): No  Physical Activity: Insufficiently Active  . Days of Exercise per Week: 3 days  . Minutes of Exercise per Session: 30 min  Stress: No Stress Concern Present  . Feeling of Stress : Only a little  Social Connections: Moderately Integrated  . Frequency of Communication with Friends and Family: More than three times a week  . Frequency of Social Gatherings with Friends and Family: More than three times a week  . Attends Religious Services: More than 4 times per year  . Active Member of Clubs or Organizations: Yes  . Attends Archivist Meetings: More than 4 times per year  . Marital Status: Divorced    Subjective: Review of Systems  Constitutional: Negative for chills and fever.  HENT: Negative for congestion and hearing loss.   Eyes: Negative for blurred vision and double vision.  Respiratory: Negative for cough and shortness of breath.   Cardiovascular: Negative for chest pain and palpitations.  Gastrointestinal: Positive for heartburn. Negative for abdominal pain, blood in stool, constipation, diarrhea, melena and vomiting.       Dysphagia  Genitourinary: Negative for dysuria and urgency.  Musculoskeletal: Negative for joint pain and myalgias.  Skin: Negative for itching and rash.  Neurological: Negative for dizziness and headaches.  Psychiatric/Behavioral: Negative for depression. The patient is not nervous/anxious.       Objective: BP 116/62   Pulse (!) 49   Temp (!) 97.3 F (36.3 C) (Temporal)   Ht 5\' 8"  (1.727 m)   Wt 137 lb (62.1 kg)   BMI 20.83 kg/m  Physical Exam Constitutional:      Appearance: Normal appearance.  HENT:     Head: Normocephalic and atraumatic.  Eyes:     Extraocular Movements: Extraocular movements intact.     Conjunctiva/sclera: Conjunctivae normal.  Cardiovascular:     Rate and Rhythm: Normal rate and regular rhythm.  Pulmonary:     Effort: Pulmonary effort is normal.     Breath sounds: Normal breath sounds.  Abdominal:     General: Bowel sounds are normal.     Palpations: Abdomen is soft.  Musculoskeletal:        General: Normal range of motion.     Cervical back: Normal range of motion and neck supple.  Skin:    General: Skin is warm.  Neurological:     General: No focal deficit present.     Mental Status: He is alert and oriented to person, place, and time.  Psychiatric:  Mood and Affect: Mood normal.        Behavior: Behavior normal.      Assessment: *Dysphagia-chronic *GERD-chronic, mildly controlled *Constipation-improved on Amitiza 24 mg twice daily  Plan: Patient's ongoing dysphagia and GERD continues to be his biggest issue.  We are awaiting pH impedance testing at this time.  He has undergone extensive work-up as per HPI.  I will add on baclofen 5 mg twice daily before meals to see if this helps.  Pending pH impedance testing, we may consider surgical referral for potential antireflux surgery.  I do think there is a psychiatric component to patient's symptoms to some degree as he does have underlying anxiety and depression though he does not believe this is the case.  He states he only thinks about this all day because it is his biggest issue.  Constipation well controlled on Amitiza 24 mg twice daily.  We will continue.  Patient follow-up in 6 to 8 weeks or sooner if needed.  10/30/2020 9:59 AM   Disclaimer: This note was  dictated with voice recognition software. Similar sounding words can inadvertently be transcribed and may not be corrected upon review.

## 2020-11-02 ENCOUNTER — Other Ambulatory Visit: Payer: Self-pay | Admitting: Gastroenterology

## 2020-11-05 IMAGING — DX LEFT HAND - COMPLETE 3+ VIEW
3 series · 3 of 3 positions shown · non-contrast
Comparison: Multiple exams, including August 11, 2017 and January 08, 2009

CLINICAL DATA: Left hand and wrist pain with swelling. Tangled in
dog leash about 10 days ago.

EXAM:
LEFT HAND - COMPLETE 3+ VIEW

[hand pa]
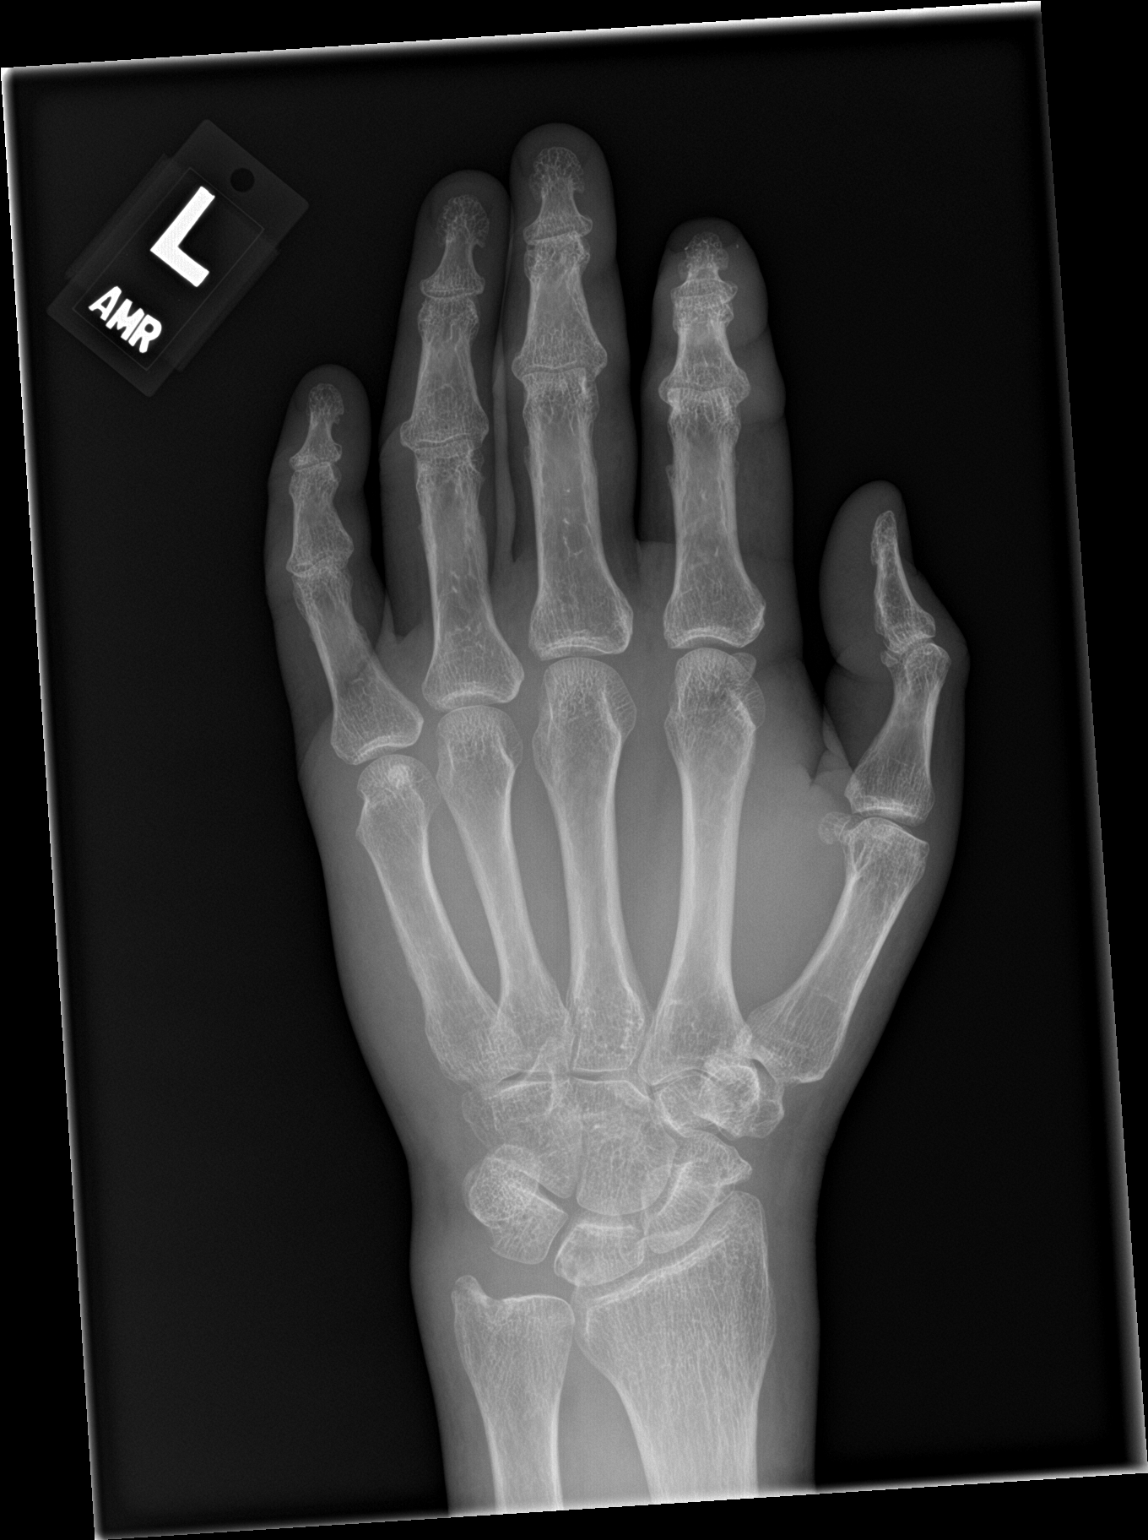

[hand obl]
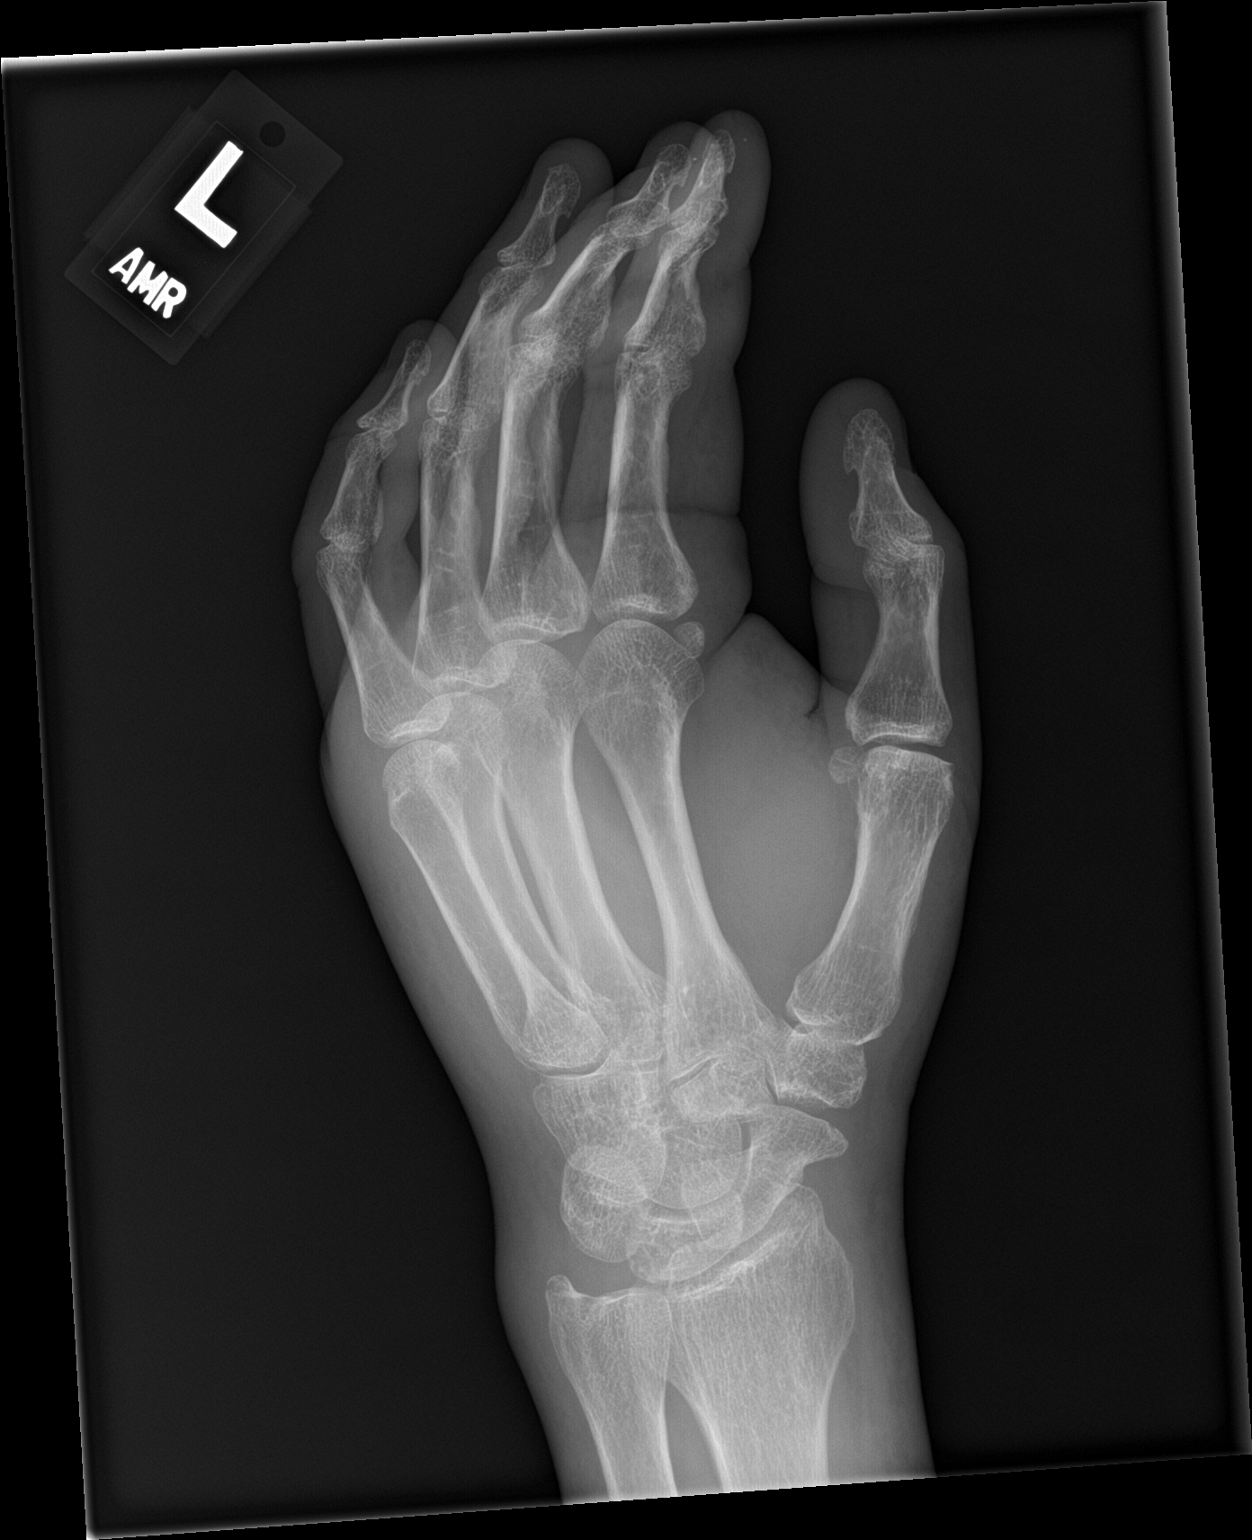

[hand lat]
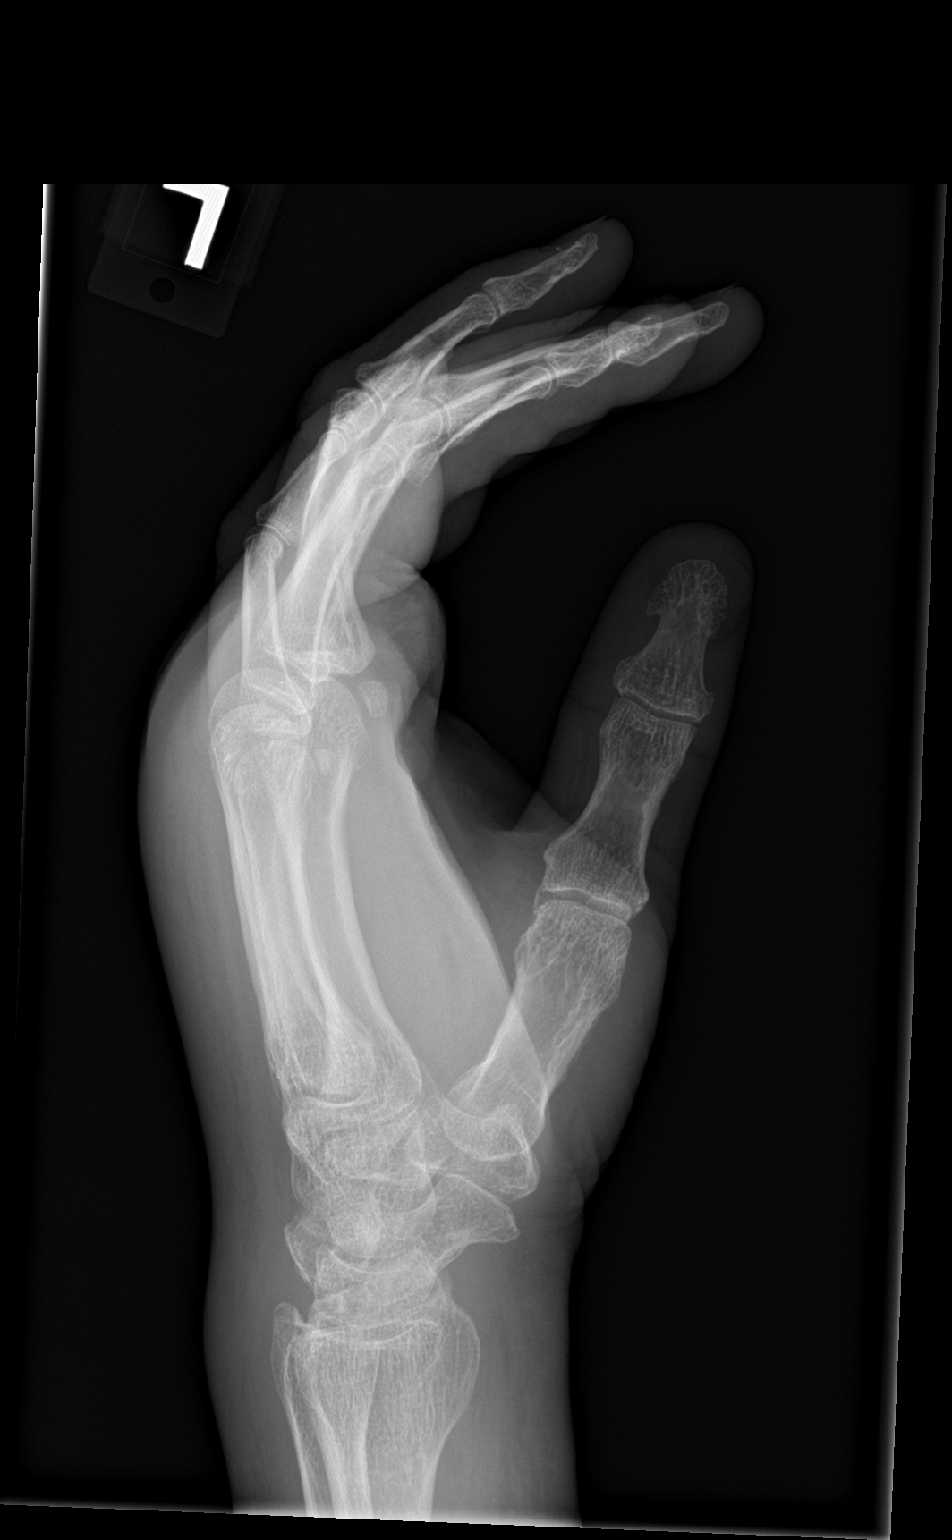

[3 of 3 positions shown; findings below may reference images not displayed]

FINDINGS: Dorsal subcutaneous edema along the hand. There is also some
subcutaneous edema dorsal to the wrist. Subtle indistinct lucency
along the ulnar styloid suspicious for a nondisplaced ulnar styloid
fracture, correlate with point tenderness.
IMPRESSION: 1. Subcutaneous edema along the dorsum of the hand and wrist. Subtle
lucency in the ulnar styloid is suspicious for a fracture, correlate
with point tenderness.

## 2020-11-05 NOTE — Progress Notes (Signed)
Reviewed and agree with documentation and assessment and plan. K. Veena Fenna Semel , MD   

## 2020-11-18 ENCOUNTER — Other Ambulatory Visit: Payer: Self-pay | Admitting: Family Medicine

## 2020-11-18 DIAGNOSIS — E785 Hyperlipidemia, unspecified: Secondary | ICD-10-CM

## 2020-11-20 ENCOUNTER — Telehealth: Payer: Self-pay | Admitting: *Deleted

## 2020-11-20 NOTE — Telephone Encounter (Signed)
Per Dr. Abbey Chatters route to him

## 2020-11-20 NOTE — Telephone Encounter (Signed)
Called patient today.  We did receive his pH testing which showed good acid suppression on PPI though with evidence of increased gastroesophageal weakly acid reflux. Dr. Silverio Decamp did make mention of possible TIF or fundoplication to decrease nonacid reflux.  I discussed these results with patient.  He is interested in antireflux procedure.

## 2020-11-20 NOTE — Telephone Encounter (Signed)
Patient has called in requesting results of the [place] part of his study--he said Dr. Abbey Chatters has given him the results of the manometry and other tests just not this part.  Forgive me--I call myself looking but I may not have searched hard enough.  Can we check and see if we have that and if not call to get these.  Marc [name] would like a call back today if possible--even if it is just we now have them and Dr. Abbey Chatters will review and call him.  Thanks   [phone]  Dena if you see them or need someone to help retrieve let me know--I can relay message that we have them for you.

## 2020-12-03 ENCOUNTER — Telehealth: Payer: Self-pay | Admitting: Internal Medicine

## 2020-12-03 DIAGNOSIS — R1310 Dysphagia, unspecified: Secondary | ICD-10-CM

## 2020-12-03 NOTE — Telephone Encounter (Signed)
Dr. Abbey Chatters please advise where you want to send referral for antireflux procedure.

## 2020-12-03 NOTE — Telephone Encounter (Signed)
Pt called to say that Dr Abbey Chatters was going to refer him somewhere and hasn't heard from anyone. Please call [phone]

## 2020-12-11 NOTE — Addendum Note (Signed)
Addended by: Zara Council C on: 12/11/2020 02:03 PM   Modules accepted: Orders

## 2020-12-11 NOTE — Telephone Encounter (Signed)
Called and informed pt Dr. Abbey Chatters advised to refer him to Dr. Silverio Decamp.  Referral sent to Dr. Silverio Decamp via [facility].

## 2020-12-11 NOTE — Telephone Encounter (Signed)
Can we please refer him to Dr. Silverio Decamp at [place] for refractory dysphagia? Thank you

## 2020-12-16 ENCOUNTER — Other Ambulatory Visit: Payer: Self-pay | Admitting: Family Medicine

## 2020-12-16 DIAGNOSIS — E785 Hyperlipidemia, unspecified: Secondary | ICD-10-CM

## 2021-01-01 ENCOUNTER — Ambulatory Visit: Payer: Medicare HMO | Admitting: Internal Medicine

## 2021-01-01 ENCOUNTER — Encounter: Payer: Self-pay | Admitting: Internal Medicine

## 2021-01-01 VITALS — BP 111/69 | HR 52 | Temp 97.3°F | Ht 68.0 in | Wt 136.0 lb

## 2021-01-01 DIAGNOSIS — K219 Gastro-esophageal reflux disease without esophagitis: Secondary | ICD-10-CM | POA: Diagnosis not present

## 2021-01-01 DIAGNOSIS — R1310 Dysphagia, unspecified: Secondary | ICD-10-CM

## 2021-01-01 MED ORDER — LANSOPRAZOLE 30 MG PO CPDR: 30.0000 mg | DELAYED_RELEASE_CAPSULE | Freq: Two times a day (BID) | ORAL | 5 refills | Status: DC

## 2021-01-01 MED ORDER — FAMOTIDINE 20 MG PO TABS: 20.0000 mg | ORAL_TABLET | Freq: Two times a day (BID) | ORAL | 5 refills | Status: DC

## 2021-01-01 MED ORDER — BACLOFEN 5 MG PO TABS: 5.0000 mg | ORAL_TABLET | Freq: Two times a day (BID) | ORAL | 5 refills | Status: AC

## 2021-01-01 NOTE — Patient Instructions (Signed)
We will try to expedite your referral down to [facility] to see Dr. Silverio Decamp who is a motility specialist.  I will await her recommendations.  Continue on lansoprazole, famotidine, and baclofen.  I refilled all 30 of these today in clinic.  At [facility] we value your feedback. You may receive a survey about your visit today. Please share your experience as we strive to create trusting relationships with our patients to provide genuine, compassionate, quality care.  We appreciate your understanding and patience as we review any laboratory studies, imaging, and other diagnostic tests that are ordered as we care for you. Our office policy is 5 business days for review of these results, and any emergent or urgent results are addressed in a timely manner for your best interest. If you do not hear from our office in 1 week, please contact us.   We also encourage the use of MyChart, which contains your medical information for your review as well. If you are not enrolled in this feature, an access code is on this after visit summary for your convenience. Thank you for allowing [name] to be involved in your care.  It was great to see you today!  I hope you have a great rest of your spring!!    Elon Alas. Abbey Chatters, D.O. Gastroenterology and Hepatology [facility]

## 2021-01-01 NOTE — Progress Notes (Signed)
Referring Provider: Fayrene Helper, MD Primary Care Physician:  Fayrene Helper, MD Primary GI:  Dr. Abbey Chatters  Chief Complaint  Patient presents with  . Gastroesophageal Reflux    Sometimes has to regurgitate phlegm. Some days feels sick. Cold chills sometimes and achy after breakfast for about 3 hours    HPI:   Marc [name] is a 65 y.o. male who presents to clinic today for follow-up visit.  He has a complicated past GI history.  We are currently following him for chronic GERD, dysphagia, constipation. Also with history ofcolon polyps, hemorrhoids, and hep C genotype 1a,elastography F3/F4 s/p treatment in the past with Harvoni and documented SVR. Post treatment elastography with kPa6.2 in February 2021.  Last colonoscopy in April 2021 with poor prep, polyp removed but not retrieved. Recommended repeat colonoscopy in 3 months. This has yet to be completed. Last EGD in October 2021 for dysphagia with benign-appearing esophageal stenosis s/p dilation, gastritis s/p biopsy, normal examined duodenum. Pathology negative H. Pylori.  Patient's main issue has been ongoing dysphagia.  He has had extensive work-up including esophagram, modified barium, ENT work-up including laryngoscopy.  He has tried and failed basically every PPI including pantoprazole, Nexium, Dexilant.  Currently on Prevacid 30 mg twice daily with mild improvement.  He underwent EGD 05/28/2020 with dilation without improvement in symptoms.  Diagnostic showed H. pylori negative gastritis.  He underwent esophageal manometry last week which was normal.    Subsequent pH testing showed good acid suppression on PPI though with evidence of increase gastroesophageal weakly acid reflux.  Constipation well controlled on Amitiza 24 mg twice daily.  Today, patient continues to complain of ongoing dysphagia and GERD.  I trialed him on baclofen after previous visit he states his symptoms are slightly improved  though he does not enjoy the side effects of fatigue.  Past Medical History:  Diagnosis Date  . Acute encephalopathy 12/29/2017   Hospitalized 5/22 to 5/23 with acute encephalopathy, unclear etiology  . Acute gout involving toe of right foot 09/14/2019  . Alcoholism ([place])   . Allergy   . Anxiety   . Arthritis   . Back problem   . Chest wall pain 03/28/2017  . Chronic hepatitis C without hepatic coma ([place]) 01/04/2009   Qualifier: Diagnosis of  By: Westly Pam. 2008: CT A/P NO CIRRHOSIS AUG 2013 MRCP- DILATED CBD/NO CIRRHOSIS DEC 2013 AST 99-102  ALT 145-156 PLT 147 HB 13.4 ALB 4.4-4.7 T BIL 0.7   . Depression   . Dilation of biliary tract 07/14/2012   EUS JAN 2014 BENIGN CBD DILATION   . Epigastric pain 07/14/2012  . GERD (gastroesophageal reflux disease)   . Gout   . HCV (hepatitis C virus) 06/16/2015  . Hepatitis B   . Hepatitis C 1979  . Hepatitis C reactive    LIVER BX 2008-CHRONIC ACTIVE HEPATITIS  . HTN (hypertension)   . Hx of adenomatous colonic polyps 2008   due for surveillance 2017  . Hypercholesterolemia   . IBS (irritable bowel syndrome)   . Irritable bowel syndrome 01/04/2009   Qualifier: Diagnosis of  By: Westly Pam.   . Knee pain   . Left forearm pain 08/15/2017  . Leukopenia 11/06/2015  . Lichen planus   . Narcotic addiction [facility]) 06/10/2011   [facility]- Oct 2012   . Normal cardiac stress test 05/25/2011   [facility] [place]  . Normal echocardiogram 05/25/2011   EF 55%,  mild TR  . Plaque psoriasis   . RUQ pain 07/14/2012  . Status post dilation of esophageal narrowing   . Substance abuse ([place])    narcotic addiction  . Thrombocytopenia ([place]) 11/06/2015  . Trigger middle finger of left hand 06/20/2012    Past Surgical History:  Procedure Laterality Date  . [place] STUDY  10/21/2020   Procedure: [place] STUDY;  Surgeon: Mauri Pole, MD;  Location: [facility];  Service: Endoscopy;;  . APPENDECTOMY     . BALLOON DILATION N/A 05/28/2020   Procedure: BALLOON DILATION;  Surgeon: Eloise Harman, DO;  Location[facility];  Service: Endoscopy;  Laterality: N/A;  . BIOPSY  05/28/2020   Procedure: BIOPSY;  Surgeon: Eloise Harman, DO;  [facility];  Service: Endoscopy;;  . CARPAL TUNNEL RELEASE     rt  . COLONOSCOPY  2008 [facility] ARS D100 V8 PHEN 12.5   2 SIMPLE ADENOMAS (< 1 CM)  . COLONOSCOPY WITH PROPOFOL N/A 12/05/2019   TI normal, 3 mm polyp in ascending colon, external and internal hemorrhoids. Polyp removed but not retrieved. Poor prep  . ESOPHAGEAL MANOMETRY N/A 10/21/2020   Procedure: ESOPHAGEAL MANOMETRY (EM);  Surgeon: Mauri Pole, MD;  Location: [facility];  Service: Endoscopy;  Laterality: N/A;  . ESOPHAGOGASTRODUODENOSCOPY  04/26/2012   Dr. Oneida Alar: Non-erosive gastritis (inflammation) was found in the gastric antrum but no H.pylori; multiple biopsies (duodenal bx negative for Celiac)/ The mucosa of the esophagus appeared normal  . ESOPHAGOGASTRODUODENOSCOPY (EGD) WITH PROPOFOL N/A 12/05/2019   mild gastritis, duodenitis due to aspirin/NSAIDs.   Marland Kitchen ESOPHAGOGASTRODUODENOSCOPY (EGD) WITH PROPOFOL N/A 05/28/2020   Procedure: ESOPHAGOGASTRODUODENOSCOPY (EGD) WITH PROPOFOL;  Surgeon: Eloise Harman, DO;  [facility];  Service: Endoscopy;  Laterality: N/A;  2:30pm  . EUS  09/08/2012   Dr. Ardis Hughs: CBD dilated but no stones. Query secondary to Sphincter of Oddi stenosis, ?dysfunction, but clinically without symptoms  . FRACTURE SURGERY N/A    Phreesia 08/25/2020  . HARDWARE REMOVAL Right 02/12/2014   Procedure: HARDWARE REMOVAL;  Surgeon: Jolyn Nap, MD;  Location: [facility];  Service: Orthopedics;  Laterality: Right;  . KNEE ARTHROSCOPY    . Neurostimulator implant    . POLYPECTOMY  12/05/2019   Procedure: POLYPECTOMY;  Surgeon: Danie Binder, MD;  [facility];  Service: Endoscopy;;  . right ring finger    . spinal  stenosis, had screws put in neck  04/11/2009   DR KRITZER  . TONSILLECTOMY    . ULNA OSTEOTOMY Right 02/12/2014   Procedure: RIGHT ULNAR SHORTENING AND OSTEOTOMY ;  Surgeon: Jolyn Nap, MD;  Location: [place];  Service: Orthopedics;  Laterality: Right;  . ULNAR NERVE TRANSPOSITION    . UPPER GASTROINTESTINAL ENDOSCOPY  2008 SLF ABD PAIN WEIGHT LOSS d100 v8 phen 12.5   NL  . WRIST SURGERY Right jan 2015   Dr. Edmonia Lynch    Current Outpatient Medications  Medication Sig Dispense Refill  . AMBULATORY NON FORMULARY MEDICATION Kratom Take 2 tablet by mouth twice daily    . AMBULATORY NON FORMULARY MEDICATION Lavena Stanford caffiene 1-2 tablet twice daily    . Ascorbic Acid (VITAMIN C WITH ROSE HIPS) 500 MG tablet Take 500 mg by mouth daily.     Marland Kitchen aspirin EC 81 MG tablet Take 81 mg by mouth daily.    Marland Kitchen atorvastatin (LIPITOR) 10 MG tablet TAKE (1) TABLET BY MOUTH ONCE  DAILY. 30 tablet 0  . cloNIDine (CATAPRES) 0.1 MG tablet Take 1 tablet (0.1 mg total) by mouth daily. 30 tablet 1  . ibuprofen (ADVIL) 200 MG tablet Take 400 mg by mouth 2 (two) times daily.    Marland Kitchen lubiprostone (AMITIZA) 24 MCG capsule TAKE 1 CAPSULE TWICE DAILY WITH MEALS. 60 capsule 5  . mirtazapine (REMERON) 15 MG tablet TAKE (1) TABLET BY MOUTH AT BEDTIME. 30 tablet 6  . Multiple Vitamin (MULTIVITAMIN WITH MINERALS) TABS tablet Take 1 tablet by mouth daily.    . Nutritional Supplements (KETO PO) Take 1 tablet by mouth in the morning, at noon, and at bedtime.     . Probiotic Product (PROBIOTIC DAILY PO) Take by mouth daily.    Marland Kitchen senna (SENOKOT) 8.6 MG tablet Take 2 tablets by mouth 2 (two) times daily.    . Turmeric 400 MG CAPS Take 800 mg by mouth daily.     . Vitamin D3 (VITAMIN D) 25 MCG tablet Take 4,000 Units by mouth daily.     Marland Kitchen acetaminophen (TYLENOL) 500 MG tablet Take 1,000 mg by mouth every 8 (eight) hours as needed for moderate pain.  (Patient not taking: Reported on 01/01/2021)    . Baclofen 5 MG  TABS Take 5 mg by mouth 2 (two) times daily before a meal. 60 tablet 5  . famotidine (PEPCID) 20 MG tablet Take 1 tablet (20 mg total) by mouth 2 (two) times daily. Take one tablet in the morning and one tablet at bedtime 60 tablet 5  . lansoprazole (PREVACID) 30 MG capsule Take 1 capsule (30 mg total) by mouth 2 (two) times daily before a meal. 60 capsule 5   No current facility-administered medications for this visit.    Allergies as of 01/01/2021 - Review Complete 01/01/2021  Allergen Reaction Noted  . Levofloxacin Nausea Only and Other (See Comments)   . Ciprofloxacin  06/13/2015  . Sulfa antibiotics  12/02/2012    Family History  Problem Relation Age of Onset  . Bladder Cancer Mother        rare form   . Cancer Sister        Metastatic abdominal  . Emotional abuse Sister   . Stroke Sister   . Emotional abuse Sister   . Heart failure Father   . Stroke Father   . Alcohol abuse Father   . Dementia Paternal Aunt   . Alcohol abuse Paternal Uncle   . Dementia Paternal Uncle   . Dementia Paternal Grandmother   . Stroke Paternal Grandmother   . ADD / ADHD Cousin   . Bipolar disorder Cousin   . Anxiety disorder Cousin   . Depression Cousin        Committed suicide  . Alcohol abuse Cousin   . OCD Cousin   . Paranoid behavior Cousin   . Brain cancer Maternal Grandfather   . Heart defect Other        FAMILY HX  . Colon cancer Maternal Uncle   . Colon cancer Cousin   . Colon polyps Neg Hx   . Drug abuse Neg Hx   . Schizophrenia Neg Hx   . Seizures Neg Hx   . Sexual abuse Neg Hx   . Physical abuse Neg Hx     Social History   Socioeconomic History  . Marital status: Divorced    Spouse name: Not on file  . Number of children: 1  . Years of education: Not on file  . Highest education  level: Not on file  Occupational History  . Occupation: Disabled/retired  Tobacco Use  . Smoking status: Former Smoker    Packs/day: 1.50    Years: 8.00    Pack years: 12.00     Types: Cigarettes    Quit date: 08/13/1986    Years since quitting: 34.4  . Smokeless tobacco: Never Used  . Tobacco comment: quit 20 + yrs ago  Vaping Use  . Vaping Use: Never used  Substance and Sexual Activity  . Alcohol use: Not Currently    Alcohol/week: 0.0 standard drinks    Comment: Used to drink heavily- went to treatment in May 2020.   Marland Kitchen Drug use: Not Currently    Comment: pain medications, klonopin- none in the last 17 months. Currently in [place]  . Sexual activity: Never  Other Topics Concern  . Not on file  Social History Narrative  . Not on file   Social Determinants of Health   Financial Resource Strain: Low Risk   . Difficulty of Paying Living Expenses: Not hard at all  Food Insecurity: No Food Insecurity  . Worried About Charity fundraiser in the Last Year: Never true  . Ran Out of Food in the Last Year: Never true  Transportation Needs: No Transportation Needs  . Lack of Transportation (Medical): No  . Lack of Transportation (Non-Medical): No  Physical Activity: Insufficiently Active  . Days of Exercise per Week: 3 days  . Minutes of Exercise per Session: 30 min  Stress: No Stress Concern Present  . Feeling of Stress : Only a little  Social Connections: Moderately Integrated  . Frequency of Communication with Friends and Family: More than three times a week  . Frequency of Social Gatherings with Friends and Family: More than three times a week  . Attends Religious Services: More than 4 times per year  . Active Member of Clubs or Organizations: Yes  . Attends Archivist Meetings: More than 4 times per year  . Marital Status: Divorced    Subjective: Review of Systems  Constitutional: Negative for chills and fever.  HENT: Negative for congestion and hearing loss.   Eyes: Negative for blurred vision and double vision.  Respiratory: Negative for cough and shortness of breath.   Cardiovascular: Negative for chest pain and palpitations.   Gastrointestinal: Positive for heartburn. Negative for abdominal pain, blood in stool, constipation, diarrhea, melena and vomiting.       Dysphagia  Genitourinary: Negative for dysuria and urgency.  Musculoskeletal: Negative for joint pain and myalgias.  Skin: Negative for itching and rash.  Neurological: Negative for dizziness and headaches.  Psychiatric/Behavioral: Negative for depression. The patient is not nervous/anxious.      Objective: BP 111/69   Pulse (!) 52   Temp (!) 97.3 F (36.3 C) (Temporal)   Ht 5\' 8"  (1.727 m)   Wt 136 lb (61.7 kg)   BMI 20.68 kg/m  Physical Exam Constitutional:      Appearance: Normal appearance.  HENT:     Head: Normocephalic and atraumatic.  Eyes:     Extraocular Movements: Extraocular movements intact.     Conjunctiva/sclera: Conjunctivae normal.  Cardiovascular:     Rate and Rhythm: Normal rate and regular rhythm.  Pulmonary:     Effort: Pulmonary effort is normal.     Breath sounds: Normal breath sounds.  Abdominal:     General: Bowel sounds are normal.     Palpations: Abdomen is soft.  Musculoskeletal:  General: Normal range of motion.     Cervical back: Normal range of motion and neck supple.  Skin:    General: Skin is warm.  Neurological:     General: No focal deficit present.     Mental Status: He is alert and oriented to person, place, and time.  Psychiatric:        Mood and Affect: Mood normal.        Behavior: Behavior normal.      Assessment: *Dysphagia-chronic *GERD-chronic, mildly controlled *Constipation-improved on Amitiza 24 mg twice daily  Plan: Patient's ongoing dysphagia and GERD continues to be his biggest issue.  He has undergone extensive work-up as per HPI.    Continue on lansoprazole, famotidine, and baclofen.  I do think there is a psychiatric component to patient's symptoms to some degree as he does have underlying anxiety and depression though he does not believe this is the case.  He  states he only thinks about this all day because it is his biggest issue.  I have referred patient to Dr. Silverio Decamp to discuss his refractory dysphagia.  Very much appreciate her help with this complicated patient.  Constipation well controlled on Amitiza 24 mg twice daily.  We will continue.   01/01/2021 2:01 PM   Disclaimer: This note was dictated with voice recognition software. Similar sounding words can inadvertently be transcribed and may not be corrected upon review.

## 2021-01-07 ENCOUNTER — Telehealth: Payer: Self-pay | Admitting: Gastroenterology

## 2021-01-07 NOTE — Telephone Encounter (Signed)
We received a referral for patient to see Dr. Silverio Decamp for refractory dysphagia. Could you please advise on scheduling?

## 2021-01-08 NOTE — Telephone Encounter (Signed)
Will not be able to accommodate the request at this point . Thanks

## 2021-01-08 NOTE — Telephone Encounter (Signed)
Dr Silverio Decamp please review the referral.  He had manometry and Ph study in March with you. Both were normal. EGD was done 05/28/20. Thank you

## 2021-01-09 ENCOUNTER — Telehealth: Payer: Self-pay

## 2021-01-09 DIAGNOSIS — R1310 Dysphagia, unspecified: Secondary | ICD-10-CM

## 2021-01-09 NOTE — Telephone Encounter (Signed)
Left message with referral coordinator, Tretha Sciara. Let her know Dr. Silverio Decamp was not able to accommodate the referral at this time.

## 2021-01-09 NOTE — Telephone Encounter (Signed)
Received voicemail from [place], Dr. Silverio Decamp is unable to accommodate request for referral for refractory dysphagia.   Dr. Abbey Chatters, see phone note made by [facility].

## 2021-01-15 ENCOUNTER — Other Ambulatory Visit: Payer: Self-pay | Admitting: Family Medicine

## 2021-01-15 ENCOUNTER — Telehealth (INDEPENDENT_AMBULATORY_CARE_PROVIDER_SITE_OTHER): Payer: Medicare HMO | Admitting: Psychiatry

## 2021-01-15 ENCOUNTER — Encounter (HOSPITAL_COMMUNITY): Payer: Self-pay | Admitting: Psychiatry

## 2021-01-15 ENCOUNTER — Other Ambulatory Visit: Payer: Self-pay

## 2021-01-15 DIAGNOSIS — F331 Major depressive disorder, recurrent, moderate: Secondary | ICD-10-CM

## 2021-01-15 DIAGNOSIS — E785 Hyperlipidemia, unspecified: Secondary | ICD-10-CM

## 2021-01-15 MED ORDER — MIRTAZAPINE 15 MG PO TABS: ORAL_TABLET | ORAL | 6 refills | Status: DC

## 2021-01-15 NOTE — Progress Notes (Signed)
Virtual Visit via Telephone Note  I connected with Marc [name] on 01/15/21 at  8:40 AM EDT by telephone and verified that I am speaking with the correct person using two identifiers.  Location: Patient: home Provider: home office   I discussed the limitations, risks, security and privacy concerns of performing an evaluation and management service by telephone and the availability of in person appointments. I also discussed with the patient that there may be a patient responsible charge related to this service. The patient expressed understanding and agreed to proceed.     I discussed the assessment and treatment plan with the patient. The patient was provided an opportunity to ask questions and all were answered. The patient agreed with the plan and demonstrated an understanding of the instructions.   The patient was advised to call back or seek an in-person evaluation if the symptoms worsen or if the condition fails to improve as anticipated.  I provided 15 minutes of non-face-to-face time during this encounter.   [name], MD  [facility] MD/PA/NP OP Progress Note  01/15/2021 9:04 AM Marc [name]  MRN:  [id number]  Chief Complaint:  Chief Complaint    Anxiety; Depression; Follow-up     HPI: This patient is a 65 year old divorced white male who lives at 48 in [place].  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  He has 1 son in [place]  The patient returns for follow-up after about 6 months.  He states in general his mood has been good and he is maintaining his sobriety and attending AA.  He is having other physical problems.  He has had difficulty with swallowing for a number of months has had numerous studies through GI.  Nothing has been very revealing.  He is supposed to have another reevaluation.  He states that in the mornings he has trouble with reading or agitation and he also has lichen planus in his mouth.  He has lost about 30 pounds but he  attributes this mostly to his schedule of walking every day as well as working out 3 times a week.  He has had to modify his diet.  He does feel that the mirtazapine has helped his anxiety depression and sleep and he would like to continue it. Visit Diagnosis:    ICD-10-CM   1. Major depressive disorder, recurrent, moderate (HCC)  F33.1     Past Psychiatric History: Prior hospitalization for alcohol detox about 2 years ago  Past Medical History:  Past Medical History:  Diagnosis Date  . Acute encephalopathy 12/29/2017   Hospitalized 5/22 to 5/23 with acute encephalopathy, unclear etiology  . Acute gout involving toe of right foot 09/14/2019  . Alcoholism ([place])   . Allergy   . Anxiety   . Arthritis   . Back problem   . Chest wall pain 03/28/2017  . Chronic hepatitis C without hepatic coma ([place]) 01/04/2009   Qualifier: Diagnosis of  By: Westly Pam. 2008: CT A/P NO CIRRHOSIS AUG 2013 MRCP- DILATED CBD/NO CIRRHOSIS DEC 2013 AST 99-102  ALT 145-156 PLT 147 HB 13.4 ALB 4.4-4.7 T BIL 0.7   . Depression   . Dilation of biliary tract 07/14/2012   EUS JAN 2014 BENIGN CBD DILATION   . Epigastric pain 07/14/2012  . GERD (gastroesophageal reflux disease)   . Gout   . HCV (hepatitis C virus) 06/16/2015  . Hepatitis B   . Hepatitis C 1979  . Hepatitis C reactive  LIVER BX 2008-CHRONIC ACTIVE HEPATITIS  . HTN (hypertension)   . Hx of adenomatous colonic polyps 2008   due for surveillance 2017  . Hypercholesterolemia   . IBS (irritable bowel syndrome)   . Irritable bowel syndrome 01/04/2009   Qualifier: Diagnosis of  By: Westly Pam.   . Knee pain   . Left forearm pain 08/15/2017  . Leukopenia 11/06/2015  . Lichen planus   . Narcotic addiction [facility]) 06/10/2011   [facility]- Oct 2012   . Normal cardiac stress test 05/25/2011   [facility]  . Normal echocardiogram 05/25/2011   EF 55%, mild TR  . Plaque psoriasis   . RUQ pain 07/14/2012  .  Status post dilation of esophageal narrowing   . Substance abuse ([place])    narcotic addiction  . Thrombocytopenia ([place]) 11/06/2015  . Trigger middle finger of left hand 06/20/2012    Past Surgical History:  Procedure Laterality Date  . [place] STUDY  10/21/2020   Procedure: [place] STUDY;  Surgeon: Mauri Pole, MD;  Location: [facility];  Service: Endoscopy;;  . APPENDECTOMY    . BALLOON DILATION N/A 05/28/2020   Procedure: BALLOON DILATION;  Surgeon: Eloise Harman, DO;  [facility];  Service: Endoscopy;  Laterality: N/A;  . BIOPSY  05/28/2020   Procedure: BIOPSY;  Surgeon: Eloise Harman, DO;  [facility];  Service: Endoscopy;;  . CARPAL TUNNEL RELEASE     rt  . COLONOSCOPY  2008 [facility] ARS D100 V8 PHEN 12.5   2 SIMPLE ADENOMAS (< 1 CM)  . COLONOSCOPY WITH PROPOFOL N/A 12/05/2019   TI normal, 3 mm polyp in ascending colon, external and internal hemorrhoids. Polyp removed but not retrieved. Poor prep  . ESOPHAGEAL MANOMETRY N/A 10/21/2020   Procedure: ESOPHAGEAL MANOMETRY (EM);  Surgeon: Mauri Pole, MD;  [facility];  Service: Endoscopy;  Laterality: N/A;  . ESOPHAGOGASTRODUODENOSCOPY  04/26/2012   Dr. Oneida Alar: Non-erosive gastritis (inflammation) was found in the gastric antrum but no H.pylori; multiple biopsies (duodenal bx negative for Celiac)/ The mucosa of the esophagus appeared normal  . ESOPHAGOGASTRODUODENOSCOPY (EGD) WITH PROPOFOL N/A 12/05/2019   mild gastritis, duodenitis due to aspirin/NSAIDs.   Marland Kitchen ESOPHAGOGASTRODUODENOSCOPY (EGD) WITH PROPOFOL N/A 05/28/2020   Procedure: ESOPHAGOGASTRODUODENOSCOPY (EGD) WITH PROPOFOL;  Surgeon: Eloise Harman, DO;  [facility];  Service: Endoscopy;  Laterality: N/A;  2:30pm  . EUS  09/08/2012   Dr. Ardis Hughs: CBD dilated but no stones. Query secondary to Sphincter of Oddi stenosis, ?dysfunction, but clinically without symptoms  . FRACTURE SURGERY N/A    Phreesia 08/25/2020   . HARDWARE REMOVAL Right 02/12/2014   Procedure: HARDWARE REMOVAL;  Surgeon: Jolyn Nap, MD;  Location: [place];  Service: Orthopedics;  Laterality: Right;  . KNEE ARTHROSCOPY    . Neurostimulator implant    . POLYPECTOMY  12/05/2019   Procedure: POLYPECTOMY;  Surgeon: Danie Binder, MD;  [facility];  Service: Endoscopy;;  . right ring finger    . spinal stenosis, had screws put in neck  04/11/2009   DR KRITZER  . TONSILLECTOMY    . ULNA OSTEOTOMY Right 02/12/2014   Procedure: RIGHT ULNAR SHORTENING AND OSTEOTOMY ;  Surgeon: Jolyn Nap, MD;  Location: [place];  Service: Orthopedics;  Laterality: Right;  . ULNAR NERVE TRANSPOSITION    . UPPER GASTROINTESTINAL ENDOSCOPY  2008 SLF ABD PAIN  WEIGHT LOSS d100 v8 phen 12.5   NL  . WRIST SURGERY Right jan 2015   Dr. Edmonia Lynch    Family Psychiatric History: see below  Family History:  Family History  Problem Relation Age of Onset  . Bladder Cancer Mother        rare form   . Cancer Sister        Metastatic abdominal  . Emotional abuse Sister   . Stroke Sister   . Emotional abuse Sister   . Heart failure Father   . Stroke Father   . Alcohol abuse Father   . Dementia Paternal Aunt   . Alcohol abuse Paternal Uncle   . Dementia Paternal Uncle   . Dementia Paternal Grandmother   . Stroke Paternal Grandmother   . ADD / ADHD Cousin   . Bipolar disorder Cousin   . Anxiety disorder Cousin   . Depression Cousin        Committed suicide  . Alcohol abuse Cousin   . OCD Cousin   . Paranoid behavior Cousin   . Brain cancer Maternal Grandfather   . Heart defect Other        FAMILY HX  . Colon cancer Maternal Uncle   . Colon cancer Cousin   . Colon polyps Neg Hx   . Drug abuse Neg Hx   . Schizophrenia Neg Hx   . Seizures Neg Hx   . Sexual abuse Neg Hx   . Physical abuse Neg Hx     Social History:  Social History   Socioeconomic History  . Marital status: Divorced     Spouse name: Not on file  . Number of children: 1  . Years of education: Not on file  . Highest education level: Not on file  Occupational History  . Occupation: Disabled/retired  Tobacco Use  . Smoking status: Former Smoker    Packs/day: 1.50    Years: 8.00    Pack years: 12.00    Types: Cigarettes    Quit date: 08/13/1986    Years since quitting: 34.4  . Smokeless tobacco: Never Used  . Tobacco comment: quit 20 + yrs ago  Vaping Use  . Vaping Use: Never used  Substance and Sexual Activity  . Alcohol use: Not Currently    Alcohol/week: 0.0 standard drinks    Comment: Used to drink heavily- went to treatment in May 2020.   Marland Kitchen Drug use: Not Currently    Comment: pain medications, klonopin- none in the last 17 months. Currently in [place]  . Sexual activity: Never  Other Topics Concern  . Not on file  Social History Narrative  . Not on file   Social Determinants of Health   Financial Resource Strain: Low Risk   . Difficulty of Paying Living Expenses: Not hard at all  Food Insecurity: No Food Insecurity  . Worried About Charity fundraiser in the Last Year: Never true  . Ran Out of Food in the Last Year: Never true  Transportation Needs: No Transportation Needs  . Lack of Transportation (Medical): No  . Lack of Transportation (Non-Medical): No  Physical Activity: Insufficiently Active  . Days of Exercise per Week: 3 days  . Minutes of Exercise per Session: 30 min  Stress: No Stress Concern Present  . Feeling of Stress : Only a little  Social Connections: Moderately Integrated  . Frequency of Communication with Friends and Family: More than three times a week  . Frequency of Social Gatherings with Friends  and Family: More than three times a week  . Attends Religious Services: More than 4 times per year  . Active Member of Clubs or Organizations: Yes  . Attends Archivist Meetings: More than 4 times per year  . Marital Status: Divorced    Allergies:  Allergies   Allergen Reactions  . Levofloxacin Nausea Only and Other (See Comments)    "Creepy" feeling, skin didn't feel right, sort of itchy.  . Ciprofloxacin     Sweating  . Sulfa Antibiotics     Metabolic Disorder Labs: Lab Results  Component Value Date   HGBA1C 5.3 06/04/2020   MPG 111.15 12/30/2017   MPG 111 10/22/2016   No results found for: PROLACTIN Lab Results  Component Value Date   CHOL 150 10/15/2020   TRIG 34 10/15/2020   HDL 68 10/15/2020   CHOLHDL 3.8 06/04/2020   VLDL 27 10/22/2016   LDLCALC 74 10/15/2020   LDLCALC 140 (H) 06/04/2020   Lab Results  Component Value Date   TSH 2.01 05/03/2019   TSH 2.056 12/30/2017    Therapeutic Level Labs: No results found for: LITHIUM No results found for: VALPROATE No components found for:  CBMZ  Current Medications: Current Outpatient Medications  Medication Sig Dispense Refill  . acetaminophen (TYLENOL) 500 MG tablet Take 1,000 mg by mouth every 8 (eight) hours as needed for moderate pain.  (Patient not taking: Reported on 01/01/2021)    . AMBULATORY NON FORMULARY MEDICATION Kratom Take 2 tablet by mouth twice daily    . AMBULATORY NON FORMULARY MEDICATION Lavena Stanford caffiene 1-2 tablet twice daily    . Ascorbic Acid (VITAMIN C WITH ROSE HIPS) 500 MG tablet Take 500 mg by mouth daily.     Marland Kitchen aspirin EC 81 MG tablet Take 81 mg by mouth daily.    Marland Kitchen atorvastatin (LIPITOR) 10 MG tablet TAKE (1) TABLET BY MOUTH ONCE DAILY. 30 tablet 0  . Baclofen 5 MG TABS Take 5 mg by mouth 2 (two) times daily before a meal. 60 tablet 5  . cloNIDine (CATAPRES) 0.1 MG tablet Take 1 tablet (0.1 mg total) by mouth daily. 30 tablet 1  . famotidine (PEPCID) 20 MG tablet Take 1 tablet (20 mg total) by mouth 2 (two) times daily. Take one tablet in the morning and one tablet at bedtime 60 tablet 5  . ibuprofen (ADVIL) 200 MG tablet Take 400 mg by mouth 2 (two) times daily.    . lansoprazole (PREVACID) 30 MG capsule Take 1 capsule (30 mg total) by mouth 2  (two) times daily before a meal. 60 capsule 5  . lubiprostone (AMITIZA) 24 MCG capsule TAKE 1 CAPSULE TWICE DAILY WITH MEALS. 60 capsule 5  . mirtazapine (REMERON) 15 MG tablet TAKE (1) TABLET BY MOUTH AT BEDTIME. 30 tablet 6  . Multiple Vitamin (MULTIVITAMIN WITH MINERALS) TABS tablet Take 1 tablet by mouth daily.    . Nutritional Supplements (KETO PO) Take 1 tablet by mouth in the morning, at noon, and at bedtime.     . Probiotic Product (PROBIOTIC DAILY PO) Take by mouth daily.    Marland Kitchen senna (SENOKOT) 8.6 MG tablet Take 2 tablets by mouth 2 (two) times daily.    . Turmeric 400 MG CAPS Take 800 mg by mouth daily.     . Vitamin D3 (VITAMIN D) 25 MCG tablet Take 4,000 Units by mouth daily.      No current facility-administered medications for this visit.     Musculoskeletal: Strength &  Muscle Tone: within normal limits Gait & Station: normal Patient leans: N/A  Psychiatric Specialty Exam: Review of Systems  Gastrointestinal:       Regurgitation, dysphagia  All other systems reviewed and are negative.   There were no vitals taken for this visit.There is no height or weight on file to calculate BMI.  General Appearance: NA  Eye Contact:  NA  Speech:  Clear and Coherent  Volume:  Normal  Mood:  Euthymic  Affect:  NA  Thought Process:  Goal Directed  Orientation:  Full (Time, Place, and Person)  Thought Content: Rumination   Suicidal Thoughts:  No  Homicidal Thoughts:  No  Memory:  Immediate;   Good Recent;   Good Remote;   Good  Judgement:  Good  Insight:  Good  Psychomotor Activity:  Normal  Concentration:  Concentration: Good and Attention Span: Good  Recall:  Good  Fund of Knowledge: Good  Language: Good  Akathisia:  No  Handed:  Right  AIMS (if indicated): not done  Assets:  Communication Skills Desire for Improvement Resilience Social Support Talents/Skills  ADL's:  Intact  Cognition: WNL  Sleep:  Good   Screenings: GAD-7   Flowsheet Row Office Visit from  07/17/2020 in [facility] Office Visit from 06/04/2020 in [facility]  Total GAD-7 Score 4 5    PHQ2-9   Flowsheet Row Video Visit from 01/15/2021 in [place] Office Visit from 10/15/2020 in [facility] Video Visit from 08/26/2020 in [facility] Office Visit from 07/17/2020 in [facility] Office Visit from 06/04/2020 in [facility]  PHQ-2 Total Score 1 2 2 1 2   PHQ-9 Total Score -- 3 8 5 8     Flowsheet Row Video Visit from 01/15/2021 in [place] Office Visit from 06/04/2020 in [place] No Risk Error: Q3, 4, or 5 should not be populated when Q2 is No       Assessment and Plan: Patient is a 65 year old male with a long history of depression and alcohol abuse.  Although he is having various GI problems he feels that the mirtazapine 15 mg at bedtime is helping with his mood and sleep.  He will continue this dosage and return to see me in 6 months   [name], MD 01/15/2021, 9:04 AM

## 2021-01-15 NOTE — Telephone Encounter (Signed)
Please refer to [facility] for second opinion.  Please send copies of pH testing, manometry, most recent EGD, and my most recent office visit note.  Thank you

## 2021-01-15 NOTE — Telephone Encounter (Signed)
Dr. Abbey Chatters, pt called wanting to know next step since [facility] is unable to accommodate referral.

## 2021-01-16 NOTE — Telephone Encounter (Addendum)
Called [facility], spoke to [place], appt scheduled at [facility] 04/22/21 at 9:30am w/Dr. Truddie Crumble. He will be added to waiting list if they have a cancellation. He will receive packet in mail with appt info.   Called and informed pt of appt w/[facility].  Referral records faxed to [facility].

## 2021-02-21 ENCOUNTER — Other Ambulatory Visit: Payer: Self-pay | Admitting: Nurse Practitioner

## 2021-02-21 DIAGNOSIS — E785 Hyperlipidemia, unspecified: Secondary | ICD-10-CM

## 2021-03-07 ENCOUNTER — Ambulatory Visit (INDEPENDENT_AMBULATORY_CARE_PROVIDER_SITE_OTHER): Payer: Medicare HMO

## 2021-03-07 ENCOUNTER — Other Ambulatory Visit: Payer: Self-pay

## 2021-03-07 DIAGNOSIS — Z0000 Encounter for general adult medical examination without abnormal findings: Secondary | ICD-10-CM | POA: Diagnosis not present

## 2021-03-07 NOTE — Patient Instructions (Signed)
Marc [name] , Thank you for taking time to come for your Medicare Wellness Visit. I appreciate your ongoing commitment to your health goals. Please review the following plan we discussed and let me know if I can assist you in the future.   Screening recommendations/referrals: Colonoscopy: Completed, will re-screen due to clarity.    Recommended yearly ophthalmology/optometry visit for glaucoma screening and checkup Recommended yearly dental visit for hygiene and checkup  Vaccinations: Influenza vaccine: Due Fall 2022 Pneumococcal vaccine: Due, next office visit.  Tdap vaccine: Up to date, due 12/23/2021  Shingles vaccine: Eligible for Shingrix series - check local pharmacy for pricing.     Advanced directives: N/A   Conditions/risks identified: None   Next appointment: 04/17/2021 @ 9:20a with Dr. Moshe Cipro, MD  Preventive Care 51 Years and Older, Male Preventive care refers to lifestyle choices and visits with your health care provider that can promote health and wellness. What does preventive care include? A yearly physical exam. This is also called an annual well check. Dental exams once or twice a year. Routine eye exams. Ask your health care provider how often you should have your eyes checked. Personal lifestyle choices, including: Daily care of your teeth and gums. Regular physical activity. Eating a healthy diet. Avoiding tobacco and drug use. Limiting alcohol use. Practicing safe sex. Taking low doses of aspirin every day. Taking vitamin and mineral supplements as recommended by your health care provider. What happens during an annual well check? The services and screenings done by your health care provider during your annual well check will depend on your age, overall health, lifestyle risk factors, and family history of disease. Counseling  Your health care provider may ask you questions about your: Alcohol use. Tobacco use. Drug use. Emotional well-being. Home and  relationship well-being. Sexual activity. Eating habits. History of falls. Memory and ability to understand (cognition). Work and work Statistician. Screening  You may have the following tests or measurements: Height, weight, and BMI. Blood pressure. Lipid and cholesterol levels. These may be checked every 5 years, or more frequently if you are over 74 years old. Skin check. Lung cancer screening. You may have this screening every year starting at age 54 if you have a 30-pack-year history of smoking and currently smoke or have quit within the past 15 years. Fecal occult blood test (FOBT) of the stool. You may have this test every year starting at age 72. Flexible sigmoidoscopy or colonoscopy. You may have a sigmoidoscopy every 5 years or a colonoscopy every 10 years starting at age 11. Prostate cancer screening. Recommendations will vary depending on your family history and other risks. Hepatitis C blood test. Hepatitis B blood test. Sexually transmitted disease (STD) testing. Diabetes screening. This is done by checking your blood sugar (glucose) after you have not eaten for a while (fasting). You may have this done every 1-3 years. Abdominal aortic aneurysm (AAA) screening. You may need this if you are a current or former smoker. Osteoporosis. You may be screened starting at age 83 if you are at high risk. Talk with your health care provider about your test results, treatment options, and if necessary, the need for more tests. Vaccines  Your health care provider may recommend certain vaccines, such as: Influenza vaccine. This is recommended every year. Tetanus, diphtheria, and acellular pertussis (Tdap, Td) vaccine. You may need a Td booster every 10 years. Zoster vaccine. You may need this after age 44. Pneumococcal 13-valent conjugate (PCV13) vaccine. One dose is recommended after  age 35. Pneumococcal polysaccharide (PPSV23) vaccine. One dose is recommended after age 26. Talk to your  health care provider about which screenings and vaccines you need and how often you need them. This information is not intended to replace advice given to you by your health care provider. Make sure you discuss any questions you have with your health care provider. Document Released: 08/23/2015 Document Revised: 04/15/2016 Document Reviewed: 05/28/2015 Elsevier Interactive Patient Education  2017 [place] Prevention in the Home Falls can cause injuries. They can happen to people of all ages. There are many things you can do to make your home safe and to help prevent falls. What can I do on the outside of my home? Regularly fix the edges of walkways and driveways and fix any cracks. Remove anything that might make you trip as you walk through a door, such as a raised step or threshold. Trim any bushes or trees on the path to your home. Use bright outdoor lighting. Clear any walking paths of anything that might make someone trip, such as rocks or tools. Regularly check to see if handrails are loose or broken. Make sure that both sides of any steps have handrails. Any raised decks and porches should have guardrails on the edges. Have any leaves, snow, or ice cleared regularly. Use sand or salt on walking paths during winter. Clean up any spills in your garage right away. This includes oil or grease spills. What can I do in the bathroom? Use night lights. Install grab bars by the toilet and in the tub and shower. Do not use towel bars as grab bars. Use non-skid mats or decals in the tub or shower. If you need to sit down in the shower, use a plastic, non-slip stool. Keep the floor dry. Clean up any water that spills on the floor as soon as it happens. Remove soap buildup in the tub or shower regularly. Attach bath mats securely with double-sided non-slip rug tape. Do not have throw rugs and other things on the floor that can make you trip. What can I do in the bedroom? Use night  lights. Make sure that you have a light by your bed that is easy to reach. Do not use any sheets or blankets that are too big for your bed. They should not hang down onto the floor. Have a firm chair that has side arms. You can use this for support while you get dressed. Do not have throw rugs and other things on the floor that can make you trip. What can I do in the kitchen? Clean up any spills right away. Avoid walking on wet floors. Keep items that you use a lot in easy-to-reach places. If you need to reach something above you, use a strong step stool that has a grab bar. Keep electrical cords out of the way. Do not use floor polish or wax that makes floors slippery. If you must use wax, use non-skid floor wax. Do not have throw rugs and other things on the floor that can make you trip. What can I do with my stairs? Do not leave any items on the stairs. Make sure that there are handrails on both sides of the stairs and use them. Fix handrails that are broken or loose. Make sure that handrails are as long as the stairways. Check any carpeting to make sure that it is firmly attached to the stairs. Fix any carpet that is loose or worn. Avoid having throw rugs  at the top or bottom of the stairs. If you do have throw rugs, attach them to the floor with carpet tape. Make sure that you have a light switch at the top of the stairs and the bottom of the stairs. If you do not have them, ask someone to add them for you. What else can I do to help prevent falls? Wear shoes that: Do not have high heels. Have rubber bottoms. Are comfortable and fit you well. Are closed at the toe. Do not wear sandals. If you use a stepladder: Make sure that it is fully opened. Do not climb a closed stepladder. Make sure that both sides of the stepladder are locked into place. Ask someone to hold it for you, if possible. Clearly mark and make sure that you can see: Any grab bars or handrails. First and last  steps. Where the edge of each step is. Use tools that help you move around (mobility aids) if they are needed. These include: Canes. Walkers. Scooters. Crutches. Turn on the lights when you go into a dark area. Replace any light bulbs as soon as they burn out. Set up your furniture so you have a clear path. Avoid moving your furniture around. If any of your floors are uneven, fix them. If there are any pets around you, be aware of where they are. Review your medicines with your doctor. Some medicines can make you feel dizzy. This can increase your chance of falling. Ask your doctor what other things that you can do to help prevent falls. This information is not intended to replace advice given to you by your health care provider. Make sure you discuss any questions you have with your health care provider. Document Released: 05/23/2009 Document Revised: 01/02/2016 Document Reviewed: 08/31/2014 Elsevier Interactive Patient Education  2017 [employer].

## 2021-03-07 NOTE — Progress Notes (Signed)
Subjective:   Marc [name] is a 65 y.o. male who presents for Medicare Annual/Subsequent preventive examination.        Objective:    There were no vitals filed for this visit. There is no height or weight on file to calculate BMI.  Advanced Directives 09/19/2020 05/28/2020 05/21/2020 03/04/2020 12/01/2019 02/22/2019 12/20/2018  Does Patient Have a Medical Advance Directive? No No No No No No No  Would patient like information on creating a medical advance directive? - No - Patient declined No - Patient declined Yes (ED - Information included in AVS) - Yes (MAU/Ambulatory/Procedural Areas - Information given) -  Pre-existing out of facility DNR order (yellow form or pink MOST form) - - - - - - -    Current Medications (verified) Outpatient Encounter Medications as of 03/07/2021  Medication Sig   acetaminophen (TYLENOL) 500 MG tablet Take 1,000 mg by mouth every 8 (eight) hours as needed for moderate pain.  (Patient not taking: Reported on 01/01/2021)   AMBULATORY NON FORMULARY MEDICATION Kratom Take 2 tablet by mouth twice daily   AMBULATORY NON FORMULARY MEDICATION Friska caffiene 1-2 tablet twice daily   Ascorbic Acid (VITAMIN C WITH ROSE HIPS) 500 MG tablet Take 500 mg by mouth daily.    aspirin EC 81 MG tablet Take 81 mg by mouth daily.   atorvastatin (LIPITOR) 10 MG tablet TAKE (1) TABLET BY MOUTH ONCE DAILY.   Baclofen 5 MG TABS Take 5 mg by mouth 2 (two) times daily before a meal.   cloNIDine (CATAPRES) 0.1 MG tablet Take 1 tablet (0.1 mg total) by mouth daily.   famotidine (PEPCID) 20 MG tablet Take 1 tablet (20 mg total) by mouth 2 (two) times daily. Take one tablet in the morning and one tablet at bedtime   ibuprofen (ADVIL) 200 MG tablet Take 400 mg by mouth 2 (two) times daily.   lansoprazole (PREVACID) 30 MG capsule Take 1 capsule (30 mg total) by mouth 2 (two) times daily before a meal.   lubiprostone (AMITIZA) 24 MCG capsule TAKE 1 CAPSULE TWICE DAILY WITH MEALS.    mirtazapine (REMERON) 15 MG tablet TAKE (1) TABLET BY MOUTH AT BEDTIME.   Multiple Vitamin (MULTIVITAMIN WITH MINERALS) TABS tablet Take 1 tablet by mouth daily.   Nutritional Supplements (KETO PO) Take 1 tablet by mouth in the morning, at noon, and at bedtime.    Probiotic Product (PROBIOTIC DAILY PO) Take by mouth daily.   senna (SENOKOT) 8.6 MG tablet Take 2 tablets by mouth 2 (two) times daily.   Turmeric 400 MG CAPS Take 800 mg by mouth daily.    Vitamin D3 (VITAMIN D) 25 MCG tablet Take 4,000 Units by mouth daily.    No facility-administered encounter medications on file as of 03/07/2021.    Allergies (verified) Levofloxacin, Ciprofloxacin, and Sulfa antibiotics   History: Past Medical History:  Diagnosis Date   Acute encephalopathy 12/29/2017   Hospitalized 5/22 to 5/23 with acute encephalopathy, unclear etiology   Acute gout involving toe of right foot 09/14/2019   Alcoholism ([place])    Allergy    Anxiety    Arthritis    Back problem    Chest wall pain 03/28/2017   Chronic hepatitis C without hepatic coma ([place]) 01/04/2009   Qualifier: Diagnosis of  By: Westly Pam. 2008: CT A/P NO CIRRHOSIS AUG 2013 MRCP- DILATED CBD/NO CIRRHOSIS DEC 2013 AST 99-102  ALT 145-156 PLT 147 HB 13.4 ALB 4.4-4.7 T BIL 0.7  Depression    Dilation of biliary tract 07/14/2012   EUS JAN 2014 BENIGN CBD DILATION    Epigastric pain 07/14/2012   GERD (gastroesophageal reflux disease)    Gout    HCV (hepatitis C virus) 06/16/2015   Hepatitis B    Hepatitis C 1979   Hepatitis C reactive    LIVER BX 2008-CHRONIC ACTIVE HEPATITIS   HTN (hypertension)    Hx of adenomatous colonic polyps 2008   due for surveillance 2017   Hypercholesterolemia    IBS (irritable bowel syndrome)    Irritable bowel syndrome 01/04/2009   Qualifier: Diagnosis of  By: Westly Pam.    Knee pain    Left forearm pain 08/15/2017   Leukopenia [id number]   Lichen planus    Narcotic addiction ([place]) 06/10/2011   [facility]- Oct 2012    Normal cardiac stress test 05/25/2011   [facility] [place]   Normal echocardiogram 05/25/2011   EF 55%, mild TR   Plaque psoriasis    RUQ pain 07/14/2012   Status post dilation of esophageal narrowing    Substance abuse ([place])    narcotic addiction   Thrombocytopenia ([place]) 11/06/2015   Trigger middle finger of left hand 06/20/2012   Past Surgical History:  Procedure Laterality Date   24 HOUR [place] STUDY  10/21/2020   Procedure: 24 HOUR [place] STUDY;  Surgeon: Mauri Pole, MD;  Location: [facility];  Service: Endoscopy;;   APPENDECTOMY     BALLOON DILATION N/A 05/28/2020   Procedure: [place];  Surgeon: Eloise Harman, DO;  [facility];  Service: Endoscopy;  Laterality: N/A;   BIOPSY  05/28/2020   Procedure: BIOPSY;  Surgeon: Eloise Harman, DO;  [facility];  Service: Endoscopy;;   CARPAL TUNNEL RELEASE     rt   COLONOSCOPY  2008 [facility] ARS D100 V8 PHEN 12.5   2 SIMPLE ADENOMAS (< 1 CM)   COLONOSCOPY WITH PROPOFOL N/A 12/05/2019   TI normal, 3 mm polyp in ascending colon, external and internal hemorrhoids. Polyp removed but not retrieved. Poor prep   ESOPHAGEAL MANOMETRY N/A 10/21/2020   Procedure: ESOPHAGEAL MANOMETRY (EM);  Surgeon: Mauri Pole, MD;  Location: [facility];  Service: Endoscopy;  Laterality: N/A;   ESOPHAGOGASTRODUODENOSCOPY  04/26/2012   Dr. [place] Alar: Non-erosive gastritis (inflammation) was found in the gastric antrum but no H.pylori; multiple biopsies (duodenal bx negative for Celiac)/ The mucosa of the esophagus appeared normal   ESOPHAGOGASTRODUODENOSCOPY (EGD) WITH PROPOFOL N/A 12/05/2019   mild gastritis, duodenitis due to aspirin/NSAIDs.    ESOPHAGOGASTRODUODENOSCOPY (EGD) WITH PROPOFOL N/A 05/28/2020   Procedure: ESOPHAGOGASTRODUODENOSCOPY (EGD) WITH PROPOFOL;  Surgeon: Eloise Harman, DO;  [facility];  Service: Endoscopy;  Laterality: N/A;  2:30pm   EUS   09/08/2012   Dr. Ardis Hughs: CBD dilated but no stones. Query secondary to Sphincter of Oddi stenosis, ?dysfunction, but clinically without symptoms   FRACTURE SURGERY N/A    [employer] 08/25/2020   HARDWARE REMOVAL Right 02/12/2014   Procedure: HARDWARE REMOVAL;  Surgeon: Jolyn Nap, MD;  Location: [place];  Service: Orthopedics;  Laterality: Right;   KNEE ARTHROSCOPY     Neurostimulator implant     POLYPECTOMY  12/05/2019   Procedure: POLYPECTOMY;  Surgeon: Danie Binder, MD;  [facility];  Service: Endoscopy;;   right ring finger     spinal stenosis, had screws put in neck  04/11/2009  DR Hal Neer   TONSILLECTOMY     ULNA OSTEOTOMY Right 02/12/2014   Procedure: RIGHT ULNAR SHORTENING AND OSTEOTOMY ;  Surgeon: Jolyn Nap, MD;  Location: [place];  Service: Orthopedics;  Laterality: Right;   ULNAR NERVE TRANSPOSITION     UPPER GASTROINTESTINAL ENDOSCOPY  2008 SLF ABD PAIN WEIGHT LOSS d100 v8 phen 12.5   NL   WRIST SURGERY Right jan 2015   Dr. Edmonia Lynch   Family History  Problem Relation Age of Onset   Bladder Cancer Mother        rare form    Cancer Sister        Metastatic abdominal   Emotional abuse Sister    Stroke Sister    Emotional abuse Sister    Heart failure Father    Stroke Father    Alcohol abuse Father    Dementia Paternal Aunt    Alcohol abuse Paternal Uncle    Dementia Paternal Uncle    Dementia Paternal Grandmother    Stroke Paternal Grandmother    ADD / ADHD Cousin    Bipolar disorder Cousin    Anxiety disorder Cousin    Depression Cousin        Committed suicide   Alcohol abuse Cousin    OCD Cousin    Paranoid behavior Cousin    Brain cancer Maternal Grandfather    Heart defect Other        FAMILY HX   Colon cancer Maternal Uncle    Colon cancer Cousin    Colon polyps Neg Hx    Drug abuse Neg Hx    Schizophrenia Neg Hx    Seizures Neg Hx    Sexual abuse Neg Hx    Physical abuse Neg Hx     Social History   Socioeconomic History   Marital status: Divorced    Spouse name: Not on file   Number of children: 1   Years of education: Not on file   Highest education level: Not on file  Occupational History   Occupation: Disabled/retired  Tobacco Use   Smoking status: Former    Packs/day: 1.50    Years: 8.00    Pack years: 12.00    Types: Cigarettes    Quit date: 08/13/1986    Years since quitting: 34.5   Smokeless tobacco: Never   Tobacco comments:    quit 20 + yrs ago  Vaping Use   Vaping Use: Never used  Substance and Sexual Activity   Alcohol use: Not Currently    Alcohol/week: 0.0 standard drinks    Comment: Used to drink heavily- went to treatment in May 2020.    Drug use: Not Currently    Comment: pain medications, klonopin- none in the last 17 months. Currently in [place]   Sexual activity: Never  Other Topics Concern   Not on file  Social History Narrative   Not on file   Social Determinants of Health   Financial Resource Strain: Low Risk    Difficulty of Paying Living Expenses: Not hard at all  Food Insecurity: Not on file  Transportation Needs: No Transportation Needs   Lack of Transportation (Medical): No   Lack of Transportation (Non-Medical): No  Physical Activity: Insufficiently Active   Days of Exercise per Week: 3 days   Minutes of Exercise per Session: 30 min  Stress: Not on file  Social Connections: Not on file    Tobacco Counseling Counseling given: Not Answered Tobacco comments: quit 20 +  yrs ago   Clinical Intake:                 Diabetic? No          Activities of Daily Living In your present state of health, do you have any difficulty performing the following activities: 05/21/2020  Hearing? N  Vision? N  Difficulty concentrating or making decisions? N  Walking or climbing stairs? N  Dressing or bathing? N  Doing errands, shopping? N  Some recent data might be hidden    Patient Care Team: Fayrene Helper, MD as PCP - General (Family Medicine) Leta Baptist, MD as Consulting Physician (Otolaryngology) Drema Dallas, MD (Physical Medicine and Rehabilitation) Milly Jakob, MD as Consulting Physician (Orthopedic Surgery) Cloria Spring, MD as Consulting Physician ([place]) Eloise Harman, DO as Consulting Physician (Internal Medicine)  Indicate any recent Medical Services you may have received from other than [facility] providers in the past year (date may be approximate).     Assessment:   This is a routine wellness examination for Login.  Hearing/Vision screen No results found.  Dietary issues and exercise activities discussed:     Goals Addressed   None   Depression Screen PHQ 2/9 Scores 10/15/2020 08/26/2020 07/17/2020 06/04/2020 06/04/2020 03/04/2020 02/29/2020  PHQ - 2 Score '2 2 1 2 2 '$ 0 1  PHQ- 9 Score '3 8 5 8 7 '$ - 2  Exception Documentation - - - - - - -  Not completed - - - - - - -  Some encounter information is confidential and restricted. Go to Review Flowsheets activity to see all data.    Fall Risk Fall Risk  10/15/2020 08/26/2020 07/17/2020 06/04/2020 03/04/2020  Falls in the past year? 0 0 0 0 0  Number falls in past yr: 0 0 0 0 0  Injury with Fall? 0 0 0 0 0  Risk for fall due to : No Fall Risks No Fall Risks No Fall Risks No Fall Risks -  Follow up Falls evaluation completed Falls evaluation completed Falls evaluation completed Falls evaluation completed -    FALL RISK PREVENTION PERTAINING TO THE HOME:  Any stairs in or around the home? Yes  If so, are there any without handrails? Yes  Home free of loose throw rugs in walkways, pet beds, electrical cords, etc? Yes  Adequate lighting in your home to reduce risk of falls? Yes   ASSISTIVE DEVICES UTILIZED TO PREVENT FALLS:  Life alert? No  Use of a cane, walker or w/c? No  Grab bars in the bathroom? No  Shower chair or bench in shower? No  Elevated toilet seat or a handicapped toilet? No    TIMED UP AND GO:  Was the test performed? No .    Cognitive Function:     6CIT Screen 03/04/2020 02/08/2019 05/05/2017  What Year? 0 points 0 points 0 points  What month? 0 points 0 points 0 points  What time? 0 points 0 points 0 points  Count back from 20 0 points 0 points 0 points  Months in reverse 0 points 0 points 0 points  Repeat phrase 2 points 0 points 0 points  Total Score 2 0 0    Immunizations Immunization History  Administered Date(s) Administered   Fluad Quad(high Dose 65+) 04/11/2019, 06/04/2020   Influenza Split 06/20/2012   Influenza,inj,Quad PF,6+ Mos 04/24/2013, 08/28/2014, 06/13/2015, 08/11/2017   Moderna Sars-Covid-2 Vaccination 12/05/2019, 01/02/2020   Pneumococcal Conjugate-13 02/28/2019   Tdap 12/24/2011  Zoster, Live 10/04/2013    TDAP status: Up to date  Flu Vaccine status: Up to date  Pneumococcal vaccine status: Due, Education has been provided regarding the importance of this vaccine. Advised may receive this vaccine at local pharmacy or Health Dept. Aware to provide a copy of the vaccination record if obtained from local pharmacy or Health Dept. Verbalized acceptance and understanding.  Covid-19 vaccine status: Completed vaccines  Qualifies for Shingles Vaccine? Yes   Zostavax completed Yes   Shingrix Completed?: No.    Education has been provided regarding the importance of this vaccine. Patient has been advised to call insurance company to determine out of pocket expense if they have not yet received this vaccine. Advised may also receive vaccine at local pharmacy or Health Dept. Verbalized acceptance and understanding.  Screening Tests Health Maintenance  Topic Date Due   Zoster Vaccines- Shingrix (1 of 2) Never done   COVID-19 Vaccine (3 - Moderna risk series) 01/30/2020   PNA vac Low Risk Adult (2 of 2 - PPSV23) 02/28/2020   INFLUENZA VACCINE  03/10/2021   TETANUS/TDAP  12/23/2021   COLONOSCOPY (Pts 45-56yr Insurance coverage will  need to be confirmed)  12/04/2029   Hepatitis C Screening  Completed   HPV VACCINES  Aged Out    Health Maintenance  Health Maintenance Due  Topic Date Due   Zoster Vaccines- Shingrix (1 of 2) Never done   COVID-19 Vaccine (3 - Moderna risk series) 01/30/2020   PNA vac Low Risk Adult (2 of 2 - PPSV23) 02/28/2020    Colorectal cancer screening: Type of screening: Colonoscopy. Completed recommend to repeat due to clarity. Repeat every 10 years.  Lung Cancer Screening: (Low Dose CT Chest recommended if Age 67-80years, 30 pack-year currently smoking OR have quit w/in 15years.) does not qualify.     Additional Screening:  Hepatitis C Screening: does qualify; Completed.  Vision Screening: Recommended annual ophthalmology exams for early detection of glaucoma and other disorders of the eye. Is the patient up to date with their annual eye exam?  Yes  Who is the provider or what is the name of the office in which the patient attends annual eye exams? Dr. CJorja Loa If pt is not established with a provider, would they like to be referred to a provider to establish care? No .   Dental Screening: Recommended annual dental exams for proper oral hygiene  Community Resource Referral / Chronic Care Management: CRR required this visit?  No   CCM required this visit?  No      Plan:     I have personally reviewed and noted the following in the patient's chart:   Medical and social history Use of alcohol, tobacco or illicit drugs  Current medications and supplements including opioid prescriptions. Patient is not currently taking opioid prescriptions. Functional ability and status Nutritional status Physical activity Advanced directives List of other physicians Hospitalizations, surgeries, and ER visits in previous 12 months Vitals Screenings to include cognitive, depression, and falls Referrals and appointments  In addition, I have reviewed and discussed with patient certain  preventive protocols, quality metrics, and best practice recommendations. A written personalized care plan for preventive services as well as general preventive health recommendations were provided to patient.     ALonn Georgia LPN   [id number]  Nurse Notes: AWV conducted over the phone with pt consent to televisit via audio. Pt was in the home at the time of call and provider working off site.  This call took approx 20 min. Pt compliant with current care gaps and plans to close those by next office visit.

## 2021-03-14 ENCOUNTER — Other Ambulatory Visit (HOSPITAL_COMMUNITY): Payer: Self-pay | Admitting: Psychiatry

## 2021-03-14 DIAGNOSIS — I10 Essential (primary) hypertension: Secondary | ICD-10-CM

## 2021-03-24 ENCOUNTER — Other Ambulatory Visit: Payer: Self-pay | Admitting: Family Medicine

## 2021-03-24 DIAGNOSIS — I10 Essential (primary) hypertension: Secondary | ICD-10-CM

## 2021-04-02 ENCOUNTER — Other Ambulatory Visit: Payer: Self-pay | Admitting: Nurse Practitioner

## 2021-04-02 DIAGNOSIS — E785 Hyperlipidemia, unspecified: Secondary | ICD-10-CM

## 2021-04-02 NOTE — Telephone Encounter (Signed)
You see him next in Sept. Since, he is one of yours... want a refill?

## 2021-04-09 ENCOUNTER — Other Ambulatory Visit: Payer: Self-pay

## 2021-04-09 ENCOUNTER — Telehealth: Payer: Self-pay

## 2021-04-09 DIAGNOSIS — E785 Hyperlipidemia, unspecified: Secondary | ICD-10-CM

## 2021-04-09 MED ORDER — ATORVASTATIN CALCIUM 10 MG PO TABS: ORAL_TABLET | ORAL | 5 refills | Status: DC

## 2021-04-09 NOTE — Telephone Encounter (Signed)
Patient called need med refill  atorvastatin (LIPITOR) 10 MG tablet   Ph[facility]: [facility]

## 2021-04-09 NOTE — Telephone Encounter (Signed)
Refill sent.

## 2021-04-17 ENCOUNTER — Ambulatory Visit (INDEPENDENT_AMBULATORY_CARE_PROVIDER_SITE_OTHER): Payer: Medicare HMO | Admitting: Family Medicine

## 2021-04-17 ENCOUNTER — Encounter: Payer: Self-pay | Admitting: Family Medicine

## 2021-04-17 ENCOUNTER — Other Ambulatory Visit: Payer: Self-pay

## 2021-04-17 VITALS — BP 127/69 | HR 57 | Resp 16 | Ht 68.0 in | Wt 140.0 lb

## 2021-04-17 DIAGNOSIS — F5104 Psychophysiologic insomnia: Secondary | ICD-10-CM

## 2021-04-17 DIAGNOSIS — E785 Hyperlipidemia, unspecified: Secondary | ICD-10-CM

## 2021-04-17 DIAGNOSIS — Z23 Encounter for immunization: Secondary | ICD-10-CM | POA: Diagnosis not present

## 2021-04-17 DIAGNOSIS — F332 Major depressive disorder, recurrent severe without psychotic features: Secondary | ICD-10-CM

## 2021-04-17 DIAGNOSIS — R590 Localized enlarged lymph nodes: Secondary | ICD-10-CM

## 2021-04-17 DIAGNOSIS — I10 Essential (primary) hypertension: Secondary | ICD-10-CM | POA: Diagnosis not present

## 2021-04-17 DIAGNOSIS — R5383 Other fatigue: Secondary | ICD-10-CM

## 2021-04-17 DIAGNOSIS — E559 Vitamin D deficiency, unspecified: Secondary | ICD-10-CM | POA: Diagnosis not present

## 2021-04-17 DIAGNOSIS — Z8619 Personal history of other infectious and parasitic diseases: Secondary | ICD-10-CM | POA: Diagnosis not present

## 2021-04-17 MED ORDER — CLONIDINE HCL 0.1 MG PO TABS
0.1000 mg | ORAL_TABLET | Freq: Every day | ORAL | 3 refills | Status: DC
Start: 2021-04-17 — End: 2021-12-18

## 2021-04-17 NOTE — Patient Instructions (Signed)
Annual exam in office end October or after , call if you need me sooner  Labs today, hepatic panel, TSH and vit D    Flu vaccine and Pneumonia 20 today  Please get Covid booster and also your shingrix vaccines at your pharmacy  Continue regular exercise and healthy diet  Best with GERD  Thanks for choosing [facility], we consider it a privelige to serve you.

## 2021-04-18 LAB — HEPATIC FUNCTION PANEL
ALT: 47 IU/L — ABNORMAL HIGH (ref 0–44)
AST: 44 IU/L — ABNORMAL HIGH (ref 0–40)
Albumin: 4.7 g/dL (ref 3.8–4.8)
Alkaline Phosphatase: 66 IU/L (ref 44–121)
Bilirubin Total: 0.2 mg/dL (ref 0.0–1.2)
Bilirubin, Direct: <0.1 mg/dL (ref 0.00–0.40)
Total Protein: 6.3 g/dL (ref 6.0–8.5)

## 2021-04-18 LAB — VITAMIN D 25 HYDROXY (VIT D DEFICIENCY, FRACTURES): Vit D, 25-Hydroxy: 45.2 ng/mL (ref 30.0–100.0)

## 2021-04-18 LAB — TSH: TSH: 2.32 u[IU]/mL (ref 0.450–4.500)

## 2021-04-20 ENCOUNTER — Encounter: Payer: Self-pay | Admitting: Family Medicine

## 2021-04-20 NOTE — Progress Notes (Signed)
   HAO PARRA     MRN: [id number]      DOB: 1955-12-19   HPI Mr. Cropsey is here for follow up and re-evaluation of chronic medical conditions, medication management and review of any available recent lab and radiology data.  Preventive health is updated, specifically  Cancer screening and Immunization.   Has been referred to tertiary center re uncontrolled GERD symptoms Reports being involved with AA and remaining alcohol free The PT denies any adverse reactions to current medications since the last visit.  There are no new concerns.  There are no specific complaints   ROS Denies recent fever or chills. Denies sinus pressure, nasal congestion, ear pain or sore throat. Denies chest congestion, productive cough or wheezing. Denies chest pains, palpitations and leg swelling Denies abdominal pain, nausea, vomiting,diarrhea or constipation.   Denies dysuria, frequency, hesitancy or incontinence. Denies joint pain, swelling and limitation in mobility. Denies headaches, seizures, numbness, or tingling. Denies uncontrolled depression, anxiety or insomnia. Denies skin break down or rash.   PE  BP 127/69   Pulse (!) 57   Resp 16   Ht '5\' 8"'$  (1.727 m)   Wt 140 lb (63.5 kg)   SpO2 99%   BMI 21.29 kg/m   Patient alert and oriented and in no cardiopulmonary distress.  HEENT: No facial asymmetry, EOMI,     Neck supple .  Chest: Clear to auscultation bilaterally.decreased though adequate air entry  CVS: S1, S2 no murmurs, no S3.Regular rate.  ABD: Soft non tender.   Ext: No edema  MS: Adequate ROM spine, shoulders, hips and knees.  Skin: Intact, no ulcerations or rash noted.  Psych: Good eye contact, normal affect. Memory intact not anxious or depressed appearing.  CNS: CN 2-12 intact, power,  normal throughout.no focal deficits noted.   Assessment & Plan  Essential hypertension Controlled, no change in medication DASH diet and commitment to daily physical activity for a  minimum of 30 minutes discussed and encouraged, as a part of hypertension management. The importance of attaining a healthy weight is also discussed.  BP/Weight 04/17/2021 01/01/2021 10/30/2020 10/15/2020 10/15/2020 09/12/2020 [id number]  Systolic BP [id number]  Diastolic BP 69 69 62 70 69 71 74  Wt. (Lbs) 140 136 137 135 133 135.2 138  BMI 21.29 20.68 20.83 20.38 19.08 19.4 19.8  Some encounter information is confidential and restricted. Go to Review Flowsheets activity to see all data.       Hyperlipidemia Hyperlipidemia:Low fat diet discussed and encouraged.   Lipid Panel  Lab Results  Component Value Date   CHOL 150 10/15/2020   HDL 68 10/15/2020   LDLCALC 74 10/15/2020   TRIG 34 10/15/2020   CHOLHDL 3.8 06/04/2020     Controlled, no change in medication   MDD (major depressive disorder) Managed by psych and stable and controlled  INSOMNIA Sleep hygiene reviewed and written information offered also. Managed by psych

## 2021-04-20 NOTE — Assessment & Plan Note (Signed)
Managed by psych and stable and controlled  

## 2021-04-20 NOTE — Assessment & Plan Note (Signed)
Hyperlipidemia:Low fat diet discussed and encouraged.   Lipid Panel  Lab Results  Component Value Date   CHOL 150 10/15/2020   HDL 68 10/15/2020   LDLCALC 74 10/15/2020   TRIG 34 10/15/2020   CHOLHDL 3.8 06/04/2020     Controlled, no change in medication

## 2021-04-20 NOTE — Assessment & Plan Note (Signed)
Controlled, no change in medication DASH diet and commitment to daily physical activity for a minimum of 30 minutes discussed and encouraged, as a part of hypertension management. The importance of attaining a healthy weight is also discussed.  BP/Weight 04/17/2021 01/01/2021 10/30/2020 10/15/2020 10/15/2020 09/12/2020 [id number]  Systolic BP [id number] AB-[id number] [id number]  Diastolic BP 69 69 62 70 69 71 74  Wt. (Lbs) 140 136 137 135 133 135.2 138  BMI 21.29 20.68 20.83 20.38 19.08 19.4 19.8  Some encounter information is confidential and restricted. Go to Review Flowsheets activity to see all data.

## 2021-04-20 NOTE — Assessment & Plan Note (Signed)
Sleep hygiene reviewed and written information offered also. Managed by psych

## 2021-04-22 DIAGNOSIS — K224 Dyskinesia of esophagus: Secondary | ICD-10-CM | POA: Diagnosis not present

## 2021-04-22 DIAGNOSIS — R1319 Other dysphagia: Secondary | ICD-10-CM | POA: Diagnosis not present

## 2021-04-22 DIAGNOSIS — Z881 Allergy status to other antibiotic agents status: Secondary | ICD-10-CM | POA: Diagnosis not present

## 2021-04-22 DIAGNOSIS — Z9049 Acquired absence of other specified parts of digestive tract: Secondary | ICD-10-CM | POA: Diagnosis not present

## 2021-04-22 DIAGNOSIS — R1312 Dysphagia, oropharyngeal phase: Secondary | ICD-10-CM | POA: Diagnosis not present

## 2021-04-22 DIAGNOSIS — K219 Gastro-esophageal reflux disease without esophagitis: Secondary | ICD-10-CM | POA: Diagnosis not present

## 2021-04-22 DIAGNOSIS — Z888 Allergy status to other drugs, medicaments and biological substances status: Secondary | ICD-10-CM | POA: Diagnosis not present

## 2021-04-22 DIAGNOSIS — Z79899 Other long term (current) drug therapy: Secondary | ICD-10-CM | POA: Diagnosis not present

## 2021-04-22 NOTE — Addendum Note (Signed)
Addended by: Lonn Georgia on: 04/22/2021 09:14 AM   Modules accepted: Orders

## 2021-05-01 DIAGNOSIS — R633 Feeding difficulties, unspecified: Secondary | ICD-10-CM | POA: Diagnosis not present

## 2021-05-01 DIAGNOSIS — R1319 Other dysphagia: Secondary | ICD-10-CM | POA: Diagnosis not present

## 2021-05-01 DIAGNOSIS — K219 Gastro-esophageal reflux disease without esophagitis: Secondary | ICD-10-CM | POA: Diagnosis not present

## 2021-05-10 ENCOUNTER — Other Ambulatory Visit: Payer: Self-pay | Admitting: Gastroenterology

## 2021-05-26 DIAGNOSIS — R1319 Other dysphagia: Secondary | ICD-10-CM | POA: Diagnosis not present

## 2021-05-26 DIAGNOSIS — R633 Feeding difficulties, unspecified: Secondary | ICD-10-CM | POA: Diagnosis not present

## 2021-06-20 ENCOUNTER — Ambulatory Visit (INDEPENDENT_AMBULATORY_CARE_PROVIDER_SITE_OTHER): Payer: Medicare HMO | Admitting: Family Medicine

## 2021-06-20 ENCOUNTER — Other Ambulatory Visit: Payer: Self-pay

## 2021-06-20 ENCOUNTER — Encounter: Payer: Self-pay | Admitting: Family Medicine

## 2021-06-20 VITALS — BP 128/70 | HR 56 | Resp 17 | Ht 68.0 in | Wt 141.0 lb

## 2021-06-20 DIAGNOSIS — I10 Essential (primary) hypertension: Secondary | ICD-10-CM

## 2021-06-20 DIAGNOSIS — E785 Hyperlipidemia, unspecified: Secondary | ICD-10-CM | POA: Diagnosis not present

## 2021-06-20 DIAGNOSIS — Z125 Encounter for screening for malignant neoplasm of prostate: Secondary | ICD-10-CM | POA: Diagnosis not present

## 2021-06-20 DIAGNOSIS — Z0000 Encounter for general adult medical examination without abnormal findings: Secondary | ICD-10-CM

## 2021-06-20 DIAGNOSIS — Z0001 Encounter for general adult medical examination with abnormal findings: Secondary | ICD-10-CM

## 2021-06-20 NOTE — Patient Instructions (Addendum)
F/U I n 6 months, call if you need me sooner  PSA today , lipid and hepatic panel today  Please get Covid booster asap   Best with GI and other issues  Keep active  Thanks for choosing [facility], we consider it a privelige to serve you.

## 2021-06-20 NOTE — Progress Notes (Signed)
   Marc [name]     MRN: [id number]      DOB: November 17, 1955   HPI: Patient is in for annual physical exam. Immunization is reviewed , and recommmendations made  for getting this up to date  Having  excessive GI complaints,  now being evaluated at tertiary center Remains alcohol free   PE; BP 128/70   Pulse (!) 56   Resp 17   Ht 5\' 8"  (1.727 m)   Wt 141 lb 0.6 oz (64 kg)   SpO2 99%   BMI 21.45 kg/m    Pleasant male, alert and oriented x 3, in no cardio-pulmonary distress. Afebrile. HEENT No facial trauma or asymetry. Sinuses non tender. EOMI External ears normal,  Neck: supple, no adenopathy,JVD or thyromegaly.No bruits.  Chest: Clear to ascultation bilaterally.No crackles or wheezes. Non tender to palpation  Cardiovascular system; Heart sounds normal,  S1 and  S2 ,no S3.  No murmur, or thrill. Apical beat not displaced Peripheral pulses normal.  Abdomen: Soft, mild epigastric tenderness, no organomegaly or masses. No bruits. Bowel sounds normal. No guarding, tenderness or rebound.    Musculoskeletal exam: Full ROM of spine, hips , shoulders and knees. No deformity ,swelling or crepitus noted. No muscle wasting or atrophy.   Neurologic: Cranial nerves 2 to 12 intact. Power, tone ,sensation and reflexes normal throughout. No disturbance in gait. No tremor.  Skin: Intact, no ulceration, erythema , scaling or rash noted. Pigmentation normal throughout  Psych; Normal mood and affect. Judgement and concentration normal   Assessment & Plan:   Annual physical exam Annual exam as documented. Counseling done  re healthy lifestyle involving commitment to 150 minutes exercise per week, heart healthy diet, and attaining healthy weight.The importance of adequate sleep also discussed. Changes in health habits are decided on by the patient with goals and time frames  set for achieving them. Immunization and cancer screening needs are specifically addressed at this  visit.

## 2021-06-20 NOTE — Assessment & Plan Note (Signed)
Annual exam as documented. Counseling done  re healthy lifestyle involving commitment to 150 minutes exercise per week, heart healthy diet, and attaining healthy weight.The importance of adequate sleep also discussed. Changes in health habits are decided on by the patient with goals and time frames  set for achieving them. Immunization and cancer screening needs are specifically addressed at this visit. 

## 2021-06-21 LAB — HEPATIC FUNCTION PANEL
ALT: 33 IU/L (ref 0–44)
AST: 33 IU/L (ref 0–40)
Albumin: 4.4 g/dL (ref 3.8–4.8)
Alkaline Phosphatase: 69 IU/L (ref 44–121)
Bilirubin Total: 0.2 mg/dL (ref 0.0–1.2)
Bilirubin, Direct: <0.1 mg/dL (ref 0.00–0.40)
Total Protein: 6.2 g/dL (ref 6.0–8.5)

## 2021-06-21 LAB — LIPID PANEL
Chol/HDL Ratio: 2.1 ratio (ref 0.0–5.0)
Cholesterol, Total: 163 mg/dL (ref 100–199)
HDL: 78 mg/dL (ref >39)
LDL Chol Calc (NIH): 75 mg/dL (ref 0–99)
Triglycerides: 48 mg/dL (ref 0–149)
VLDL Cholesterol Cal: 10 mg/dL (ref 5–40)

## 2021-06-21 LAB — PSA: Prostate Specific Ag, Serum: 0.7 ng/mL (ref 0.0–4.0)

## 2021-07-25 ENCOUNTER — Other Ambulatory Visit: Payer: Self-pay | Admitting: Internal Medicine

## 2021-07-25 DIAGNOSIS — K219 Gastro-esophageal reflux disease without esophagitis: Secondary | ICD-10-CM

## 2021-07-28 DIAGNOSIS — R1319 Other dysphagia: Secondary | ICD-10-CM | POA: Diagnosis not present

## 2021-07-28 DIAGNOSIS — K219 Gastro-esophageal reflux disease without esophagitis: Secondary | ICD-10-CM | POA: Diagnosis not present

## 2021-07-28 HISTORY — PX: ESOPHAGOGASTRODUODENOSCOPY: SHX1529

## 2021-07-28 NOTE — Telephone Encounter (Signed)
Patient was referred to [facility] health and that appears his lansoprazole was changed to Aciphex twice daily.  Will defer additional PPI management for [facility].

## 2021-09-02 DIAGNOSIS — K219 Gastro-esophageal reflux disease without esophagitis: Secondary | ICD-10-CM | POA: Diagnosis not present

## 2021-09-02 DIAGNOSIS — R1319 Other dysphagia: Secondary | ICD-10-CM | POA: Diagnosis not present

## 2021-09-02 DIAGNOSIS — B1920 Unspecified viral hepatitis C without hepatic coma: Secondary | ICD-10-CM | POA: Diagnosis not present

## 2021-09-02 DIAGNOSIS — G8929 Other chronic pain: Secondary | ICD-10-CM | POA: Diagnosis not present

## 2021-09-02 DIAGNOSIS — R112 Nausea with vomiting, unspecified: Secondary | ICD-10-CM | POA: Diagnosis not present

## 2021-09-02 DIAGNOSIS — Z79899 Other long term (current) drug therapy: Secondary | ICD-10-CM | POA: Diagnosis not present

## 2021-09-02 DIAGNOSIS — M5442 Lumbago with sciatica, left side: Secondary | ICD-10-CM | POA: Diagnosis not present

## 2021-09-02 DIAGNOSIS — M5441 Lumbago with sciatica, right side: Secondary | ICD-10-CM | POA: Diagnosis not present

## 2021-09-08 ENCOUNTER — Other Ambulatory Visit: Payer: Self-pay | Admitting: Internal Medicine

## 2021-09-09 ENCOUNTER — Other Ambulatory Visit (HOSPITAL_COMMUNITY): Payer: Self-pay | Admitting: Psychiatry

## 2021-09-09 NOTE — Telephone Encounter (Signed)
Call for appt

## 2021-09-11 DIAGNOSIS — R859 Unspecified abnormal finding in specimens from digestive organs and abdominal cavity: Secondary | ICD-10-CM | POA: Diagnosis not present

## 2021-09-11 DIAGNOSIS — K1370 Unspecified lesions of oral mucosa: Secondary | ICD-10-CM | POA: Diagnosis not present

## 2021-09-11 DIAGNOSIS — L439 Lichen planus, unspecified: Secondary | ICD-10-CM | POA: Diagnosis not present

## 2021-09-24 DIAGNOSIS — E079 Disorder of thyroid, unspecified: Secondary | ICD-10-CM | POA: Diagnosis not present

## 2021-09-24 DIAGNOSIS — R859 Unspecified abnormal finding in specimens from digestive organs and abdominal cavity: Secondary | ICD-10-CM | POA: Diagnosis not present

## 2021-09-24 DIAGNOSIS — M350C Sjogren syndrome with dental involvement: Secondary | ICD-10-CM | POA: Diagnosis not present

## 2021-09-29 ENCOUNTER — Encounter: Payer: Self-pay | Admitting: Family Medicine

## 2021-09-30 ENCOUNTER — Other Ambulatory Visit: Payer: Self-pay

## 2021-09-30 ENCOUNTER — Ambulatory Visit (INDEPENDENT_AMBULATORY_CARE_PROVIDER_SITE_OTHER): Payer: Medicare HMO | Admitting: Internal Medicine

## 2021-09-30 ENCOUNTER — Encounter: Payer: Self-pay | Admitting: Internal Medicine

## 2021-09-30 DIAGNOSIS — R6889 Other general symptoms and signs: Secondary | ICD-10-CM

## 2021-09-30 DIAGNOSIS — K1370 Unspecified lesions of oral mucosa: Secondary | ICD-10-CM | POA: Diagnosis not present

## 2021-09-30 NOTE — Progress Notes (Signed)
Virtual Visit via Telephone Note   This visit type was conducted due to national recommendations for restrictions regarding the COVID-19 Pandemic (e.g. social distancing) in an effort to limit this patient's exposure and mitigate transmission in our community.  Due to his co-morbid illnesses, this patient is at least at moderate risk for complications without adequate follow up.  This format is felt to be most appropriate for this patient at this time.  The patient did not have access to video technology/had technical difficulties with video requiring transitioning to audio format only (telephone).  All issues noted in this document were discussed and addressed.  No physical exam could be performed with this format.  Evaluation Performed:  Follow-up visit  Date:  09/30/2021   ID:  Marc, [name] 27-Oct-1955, MRN [id number]  Patient Location: Home Provider Location: Office/Clinic  Participants: Patient Location of Patient: Home Location of Provider: Telehealth Consent was obtain for visit to be over via telehealth. I verified that I am speaking with the correct person using two identifiers.  PCP:  Marc Helper, MD   Chief Complaint: Discuss Lyme disease testing  History of Present Illness:    Marc [name] is a 66 y.o. male who has a televisit for discussing Lyme disease testing, recommended by his oral surgeon.  He has chronic dry mouth, for which he has seen oral surgeon at [facility].  Patient does not report any new rash.  Denies any fever, chills or recent worsening of fatigue.  He does report running in the woods on a regular basis and has had tick bites in the past.  The patient does not have symptoms concerning for COVID-19 infection (fever, chills, cough, or new shortness of breath).   Past Medical, Surgical, Social History, Allergies, and Medications have been Reviewed.  Past Medical History:  Diagnosis Date   Acute encephalopathy 12/29/2017   Hospitalized 5/22 to  5/23 with acute encephalopathy, unclear etiology   Acute gout involving toe of right foot 09/14/2019   Alcoholism ([place])    Allergy    Anxiety    Arthritis    Back problem    Chest wall pain 03/28/2017   Chronic hepatitis C without hepatic coma ([place]) 01/04/2009   Qualifier: Diagnosis of  By: Westly Pam. 2008: CT A/P NO CIRRHOSIS AUG 2013 MRCP- DILATED CBD/NO CIRRHOSIS DEC 2013 AST 99-102  ALT 145-156 PLT 147 HB 13.4 ALB 4.4-4.7 T BIL 0.7    Depression    Dilation of biliary tract 07/14/2012   EUS JAN 2014 BENIGN CBD DILATION    Epigastric pain 07/14/2012   GERD (gastroesophageal reflux disease)    Gout    HCV (hepatitis C virus) 06/16/2015   Hepatitis B    Hepatitis C 1979   Hepatitis C reactive    LIVER BX 2008-CHRONIC ACTIVE HEPATITIS   HTN (hypertension)    Hx of adenomatous colonic polyps 2008   due for surveillance 2017   Hypercholesterolemia    IBS (irritable bowel syndrome)    Irritable bowel syndrome 01/04/2009   Qualifier: Diagnosis of  By: Westly Pam.    Knee pain    Left forearm pain 08/15/2017   Leukopenia 46/50/3546   Lichen planus    Narcotic addiction ([place]) 06/10/2011   [facility]- Oct 2012    Normal cardiac stress test 05/25/2011   [facility] [place]   Normal echocardiogram 05/25/2011   EF 55%, mild TR   Oral lichen  planus    Plaque psoriasis    RUQ pain 07/14/2012   Status post dilation of esophageal narrowing    Substance abuse ([place] [place])    narcotic addiction   Thrombocytopenia ([place]) 11/06/2015   Trigger middle finger of left hand 06/20/2012   Past Surgical History:  Procedure Laterality Date   24 HOUR [place] STUDY  10/21/2020   Procedure: [place] STUDY;  Surgeon: Mauri Pole, MD;  Location: [facility];  Service: Endoscopy;;   APPENDECTOMY     BALLOON DILATION N/A 05/28/2020   Procedure: [place];  Surgeon: Eloise Harman, DO;  Location[facility];  Service: Endoscopy;   Laterality: N/A;   BIOPSY  05/28/2020   Procedure: BIOPSY;  Surgeon: Eloise Harman, DO;  [facility];  Service: Endoscopy;;   CARPAL TUNNEL RELEASE     rt   COLONOSCOPY  2008 [facility] ARS D100 V8 PHEN 12.5   2 SIMPLE ADENOMAS (< 1 CM)   COLONOSCOPY WITH PROPOFOL N/A 12/05/2019   TI normal, 3 mm polyp in ascending colon, external and internal hemorrhoids. Polyp removed but not retrieved. Poor prep   ESOPHAGEAL MANOMETRY N/A 10/21/2020   Procedure: ESOPHAGEAL MANOMETRY (EM);  Surgeon: Mauri Pole, MD;  Location: [facility];  Service: Endoscopy;  Laterality: N/A;   ESOPHAGOGASTRODUODENOSCOPY  04/26/2012   Dr. Oneida Alar: Non-erosive gastritis (inflammation) was found in the gastric antrum but no H.pylori; multiple biopsies (duodenal bx negative for Celiac)/ The mucosa of the esophagus appeared normal   ESOPHAGOGASTRODUODENOSCOPY (EGD) WITH PROPOFOL N/A 12/05/2019   mild gastritis, duodenitis due to aspirin/NSAIDs.    ESOPHAGOGASTRODUODENOSCOPY (EGD) WITH PROPOFOL N/A 05/28/2020   Procedure: ESOPHAGOGASTRODUODENOSCOPY (EGD) WITH PROPOFOL;  Surgeon: Eloise Harman, DO;  [facility];  Service: Endoscopy;  Laterality: N/A;  2:30pm   EUS  09/08/2012   Dr. Ardis Hughs: CBD dilated but no stones. Query secondary to Sphincter of Oddi stenosis, ?dysfunction, but clinically without symptoms   FRACTURE SURGERY N/A    [employer] 08/25/2020   HARDWARE REMOVAL Right 02/12/2014   Procedure: HARDWARE REMOVAL;  Surgeon: Jolyn Nap, MD;  Location: [place];  Service: Orthopedics;  Laterality: Right;   KNEE ARTHROSCOPY     Neurostimulator implant     POLYPECTOMY  12/05/2019   Procedure: POLYPECTOMY;  Surgeon: Danie Binder, MD;  [facility];  Service: Endoscopy;;   right ring finger     spinal stenosis, had screws put in neck  04/11/2009   DR KRITZER   TONSILLECTOMY     ULNA OSTEOTOMY Right 02/12/2014   Procedure: RIGHT ULNAR SHORTENING AND OSTEOTOMY ;   Surgeon: Jolyn Nap, MD;  Location: [facility];  Service: Orthopedics;  Laterality: Right;   ULNAR NERVE TRANSPOSITION     UPPER GASTROINTESTINAL ENDOSCOPY  2008 [facility] ABD PAIN WEIGHT LOSS d100 v8 phen 12.5   NL   WRIST SURGERY Right jan 2015   Dr. Edmonia Lynch     Current Meds  Medication Sig   AMBULATORY NON FORMULARY MEDICATION Kratom Take 2 tablet by mouth twice daily   AMBULATORY NON FORMULARY MEDICATION Friska caffiene 1-2 tablet twice daily   Ascorbic Acid (VITAMIN C WITH ROSE HIPS) 500 MG tablet Take 500 mg by mouth daily.    aspirin EC 81 MG tablet Take 81 mg by mouth daily.   atorvastatin (LIPITOR) 10 MG tablet TAKE (1) TABLET BY MOUTH ONCE DAILY.   cloNIDine (CATAPRES) 0.1 MG tablet Take 1 tablet (  0.1 mg total) by mouth daily.   famotidine (PEPCID) 20 MG tablet TAKE 1 TABLET BY MOUTH TWICE DAILY. TAKE WITH BREAKFAST AND AT BEDTIME.   ibuprofen (ADVIL) 200 MG tablet Take 400 mg by mouth 2 (two) times daily.   lansoprazole (PREVACID) 30 MG capsule Take 1 capsule (30 mg total) by mouth 2 (two) times daily before a meal.   lubiprostone (AMITIZA) 24 MCG capsule TAKE 1 CAPSULE TWICE DAILY WITH MEALS.   mirtazapine (REMERON) 15 MG tablet TAKE (1) TABLET BY MOUTH AT BEDTIME.   Multiple Vitamin (MULTIVITAMIN WITH MINERALS) TABS tablet Take 1 tablet by mouth daily.   Nutritional Supplements (KETO PO) Take 1 tablet by mouth in the morning, at noon, and at bedtime.    Probiotic Product (PROBIOTIC DAILY PO) Take by mouth daily.   senna (SENOKOT) 8.6 MG tablet Take 2 tablets by mouth 2 (two) times daily.   Turmeric 400 MG CAPS Take 800 mg by mouth daily.    Vitamin D3 (VITAMIN D) 25 MCG tablet Take 4,000 Units by mouth daily.      Allergies:   Levofloxacin, Ciprofloxacin, and Sulfa antibiotics   ROS:   Please see the history of present illness.     All other systems reviewed and are negative.   Labs/Other Tests and Data Reviewed:    Recent Labs: 10/15/2020:  BUN 25; Creatinine, Ser 0.85; Hemoglobin 13.7; Platelets 140; Potassium 4.3; Sodium 144 04/17/2021: TSH 2.320 06/20/2021: ALT 33   Recent Lipid Panel Lab Results  Component Value Date/Time   CHOL 163 06/20/2021 09:22 AM   TRIG 48 06/20/2021 09:22 AM   HDL 78 06/20/2021 09:22 AM   CHOLHDL 2.1 06/20/2021 09:22 AM   CHOLHDL 3.8 06/04/2020 10:15 AM   LDLCALC 75 06/20/2021 09:22 AM   LDLCALC 140 (H) 06/04/2020 10:15 AM    Wt Readings from Last 3 Encounters:  06/20/21 141 lb 0.6 oz (64 kg)  04/17/21 140 lb (63.5 kg)  01/01/21 136 lb (61.7 kg)     ASSESSMENT & PLAN:    Suspected Lyme disease Oral mucosal disease Has seen oral surgeon, recommended dry mouth protocol Considering his history of tick bites, will check Lyme serology In the absence of a new rash and systemic symptoms currently, active infection is less likely  Time:   Today, I have spent 9 minutes reviewing the chart, including problem list, medications, and with the patient with telehealth technology discussing the above problems.   Medication Adjustments/Labs and Tests Ordered: Current medicines are reviewed at length with the patient today.  Concerns regarding medicines are outlined above.   Tests Ordered: Orders Placed This Encounter  Procedures   Lyme Disease Serology w/Reflex    Medication Changes: No orders of the defined types were placed in this encounter.    Note: This dictation was prepared with Dragon dictation along with smaller phrase technology. Similar sounding words can be transcribed inadequately or may not be corrected upon review. Any transcriptional errors that result from this process are unintentional.      Disposition:  Follow up  Signed, Lindell Spar, MD  09/30/2021 9:00 AM     [facility]

## 2021-09-30 NOTE — Patient Instructions (Signed)
Please get blood test done at our office within a week.

## 2021-10-01 DIAGNOSIS — R6889 Other general symptoms and signs: Secondary | ICD-10-CM | POA: Diagnosis not present

## 2021-10-02 ENCOUNTER — Other Ambulatory Visit: Payer: Self-pay

## 2021-10-02 ENCOUNTER — Telehealth (INDEPENDENT_AMBULATORY_CARE_PROVIDER_SITE_OTHER): Payer: Medicare HMO | Admitting: Psychiatry

## 2021-10-02 ENCOUNTER — Encounter (HOSPITAL_COMMUNITY): Payer: Self-pay | Admitting: Psychiatry

## 2021-10-02 DIAGNOSIS — F331 Major depressive disorder, recurrent, moderate: Secondary | ICD-10-CM

## 2021-10-02 MED ORDER — MIRTAZAPINE 15 MG PO TABS: ORAL_TABLET | ORAL | 5 refills | Status: DC

## 2021-10-02 NOTE — Progress Notes (Signed)
Virtual Visit via Telephone Note  I connected with Marc [name] on 10/02/21 at 11:00 AM EST by telephone and verified that I am speaking with the correct person using two identifiers.  Location: Patient: home Provider: office   I discussed the limitations, risks, security and privacy concerns of performing an evaluation and management service by telephone and the availability of in person appointments. I also discussed with the patient that there may be a patient responsible charge related to this service. The patient expressed understanding and agreed to proceed.       I discussed the assessment and treatment plan with the patient. The patient was provided an opportunity to ask questions and all were answered. The patient agreed with the plan and demonstrated an understanding of the instructions.   The patient was advised to call back or seek an in-person evaluation if the symptoms worsen or if the condition fails to improve as anticipated.  I provided 15 minutes of non-face-to-face time during this encounter.   [name], MD  [facility] MD/PA/NP OP Progress Note  10/02/2021 11:21 AM Marc [name]  MRN:  [id number]  Chief Complaint:  Chief Complaint  Patient presents with   Depression   Follow-up   HPI: This patient is a 66 year-old divorced white male who lives at 44 in [place].  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  He has 1 son in [place]  The patient returns for follow-up after 6 months.  He states that he has been more frustrated lately with all of his health issues.  He is having a lot of GI problems and he has been diagnosed with gastritis.  The foods he can eat are somewhat limited.  He is lost weight and is down to 140 pounds.  He is also been running and walking quite frequently.  He also has severe dry mouth joint pain and muscle pain.  He saw an oral surgeon who ran a lot of blood tests.  His ANA is positive and sed rate somewhat  elevated but everything else was negative.  He states that the oral specialist thinks he has Sjogren's syndrome.  I strongly suggested that he get a referral to rheumatology to make sure this is correct.  I explained that there are Biologics that are available now that treat this that may help him quite a bit.  In terms of mood at times he is very low and frustrated because of his health issues but states that he would never do anything to harm himself.  He is sleeping fairly well.  He is on the mirtazapine 10 mg at bedtime and he really does not want to increase this to try adding Cymbalta to help with the chronic pain. Visit Diagnosis:    ICD-10-CM   1. Major depressive disorder, recurrent, moderate ([place])  F33.1       Past Psychiatric History: Prior hospitalization for alcohol detox about 3 years ago  Past Medical History:  Past Medical History:  Diagnosis Date   Acute encephalopathy 12/29/2017   Hospitalized 5/22 to 5/23 with acute encephalopathy, unclear etiology   Acute gout involving toe of right foot 09/14/2019   Alcoholism ([place])    Allergy    Anxiety    Arthritis    Back problem    Chest wall pain 03/28/2017   Chronic hepatitis C without hepatic coma ([place]) 01/04/2009   Qualifier: Diagnosis of  By: Westly Pam. 2008: CT A/P NO  CIRRHOSIS AUG 2013 MRCP- DILATED CBD/NO CIRRHOSIS DEC 2013 AST 99-102  ALT 145-156 PLT 147 HB 13.4 ALB 4.4-4.7 T BIL 0.7    Depression    Dilation of biliary tract 07/14/2012   EUS JAN 2014 BENIGN CBD DILATION    Epigastric pain 07/14/2012   GERD (gastroesophageal reflux disease)    Gout    HCV (hepatitis C virus) 06/16/2015   Hepatitis B    Hepatitis C 1979   Hepatitis C reactive    LIVER BX 2008-CHRONIC ACTIVE HEPATITIS   HTN (hypertension)    Hx of adenomatous colonic polyps 2008   due for surveillance 2017   Hypercholesterolemia    IBS (irritable bowel syndrome)    Irritable bowel syndrome 01/04/2009   Qualifier: Diagnosis of  By:  Westly Pam.    Knee pain    Left forearm pain 08/15/2017   Leukopenia 35/70/1779   Lichen planus    Narcotic addiction ([place]) 06/10/2011   [facility]- Oct 2012    Normal cardiac stress test 05/25/2011   [facility] [place]   Normal echocardiogram 05/25/2011   EF 55%, mild TR   Oral lichen planus    Plaque psoriasis    RUQ pain 07/14/2012   Status post dilation of esophageal narrowing    Substance abuse ([place])    narcotic addiction   Thrombocytopenia ([place]) 11/06/2015   Trigger middle finger of left hand 06/20/2012    Past Surgical History:  Procedure Laterality Date   24 HOUR [place] STUDY  10/21/2020   Procedure: 24 HOUR [place] STUDY;  Surgeon: Mauri Pole, MD;  [facility];  Service: Endoscopy;;   APPENDECTOMY     BALLOON DILATION N/A 05/28/2020   Procedure: [place];  Surgeon: Eloise Harman, DO;  [facility];  Service: Endoscopy;  Laterality: N/A;   BIOPSY  05/28/2020   Procedure: BIOPSY;  Surgeon: Eloise Harman, DO;  [facility];  Service: Endoscopy;;   CARPAL TUNNEL RELEASE     rt   COLONOSCOPY  2008 SLF ARS D100 V8 PHEN 12.5   2 SIMPLE ADENOMAS (< 1 CM)   COLONOSCOPY WITH PROPOFOL N/A 12/05/2019   TI normal, 3 mm polyp in ascending colon, external and internal hemorrhoids. Polyp removed but not retrieved. Poor prep   ESOPHAGEAL MANOMETRY N/A 10/21/2020   Procedure: ESOPHAGEAL MANOMETRY (EM);  Surgeon: Mauri Pole, MD;  [facility];  Service: Endoscopy;  Laterality: N/A;   ESOPHAGOGASTRODUODENOSCOPY  04/26/2012   Dr. Oneida Alar: Non-erosive gastritis (inflammation) was found in the gastric antrum but no H.pylori; multiple biopsies (duodenal bx negative for Celiac)/ The mucosa of the esophagus appeared normal   ESOPHAGOGASTRODUODENOSCOPY (EGD) WITH PROPOFOL N/A 12/05/2019   mild gastritis, duodenitis due to aspirin/NSAIDs.    ESOPHAGOGASTRODUODENOSCOPY (EGD) WITH PROPOFOL N/A  05/28/2020   Procedure: ESOPHAGOGASTRODUODENOSCOPY (EGD) WITH PROPOFOL;  Surgeon: Eloise Harman, DO;  [facility];  Service: Endoscopy;  Laterality: N/A;  2:30pm   EUS  09/08/2012   Dr. Ardis Hughs: CBD dilated but no stones. Query secondary to Sphincter of Oddi stenosis, ?dysfunction, but clinically without symptoms   FRACTURE SURGERY N/A    [employer] 08/25/2020   HARDWARE REMOVAL Right 02/12/2014   Procedure: HARDWARE REMOVAL;  Surgeon: Jolyn Nap, MD;  Location: [place];  Service: Orthopedics;  Laterality: Right;   KNEE ARTHROSCOPY     Neurostimulator implant     POLYPECTOMY  12/05/2019   Procedure: POLYPECTOMY;  Surgeon: Danie Binder, MD;  [facility];  Service: Endoscopy;;   right ring finger     spinal stenosis, had screws put in neck  04/11/2009   [facility] [place] Right 02/12/2014   Procedure: RIGHT ULNAR SHORTENING AND OSTEOTOMY ;  Surgeon: Jolyn Nap, MD;  Location: [place];  Service: Orthopedics;  Laterality: Right;   ULNAR NERVE TRANSPOSITION     UPPER GASTROINTESTINAL ENDOSCOPY  2008 SLF ABD PAIN WEIGHT LOSS d100 v8 phen 12.5   NL   WRIST SURGERY Right jan 2015   Dr. Edmonia Lynch    Family Psychiatric History: see below  Family History:  Family History  Problem Relation Age of Onset   Bladder Cancer Mother        rare form    Cancer Sister        Metastatic abdominal   Emotional abuse Sister    Stroke Sister    Emotional abuse Sister    Heart failure Father    Stroke Father    Alcohol abuse Father    Dementia Paternal Aunt    Alcohol abuse Paternal Uncle    Dementia Paternal Uncle    Dementia Paternal Grandmother    Stroke Paternal Grandmother    ADD / ADHD Cousin    Bipolar disorder Cousin    Anxiety disorder Cousin    Depression Cousin        Committed suicide   Alcohol abuse Cousin    OCD Cousin    Paranoid behavior Cousin    Brain cancer Maternal  Grandfather    Heart defect Other        FAMILY HX   Colon cancer Maternal Uncle    Colon cancer Cousin    Colon polyps Neg Hx    Drug abuse Neg Hx    Schizophrenia Neg Hx    Seizures Neg Hx    Sexual abuse Neg Hx    Physical abuse Neg Hx     Social History:  Social History   Socioeconomic History   Marital status: Divorced    Spouse name: Not on file   Number of children: 1   Years of education: Not on file   Highest education level: Not on file  Occupational History   Occupation: Disabled/retired  Tobacco Use   Smoking status: Former    Packs/day: 1.50    Years: 8.00    Pack years: 12.00    Types: Cigarettes    Quit date: 08/13/1986    Years since quitting: 35.1   Smokeless tobacco: Never   Tobacco comments:    quit 20 + yrs ago  Vaping Use   Vaping Use: Never used  Substance and Sexual Activity   Alcohol use: Not Currently    Alcohol/week: 0.0 standard drinks    Comment: Used to drink heavily- went to treatment in May 2020.    Drug use: Not Currently    Comment: pain medications, klonopin- none in the last 17 months. Currently in [place]   Sexual activity: Never  Other Topics Concern   Not on file  Social History Narrative   Not on file   Social Determinants of Health   Financial Resource Strain: Low Risk    Difficulty of Paying Living Expenses: Not hard at all  Food Insecurity: No Food Insecurity   Worried About [place] in the Last Year: Never true   Arboriculturist in  the Last Year: Never true  Transportation Needs: No Transportation Needs   Lack of Transportation (Medical): No   Lack of Transportation (Non-Medical): No  Physical Activity: Sufficiently Active   Days of Exercise per Week: 5 days   Minutes of Exercise per Session: 30 min  Stress: No Stress Concern Present   Feeling of Stress : Not at all  Social Connections: Socially Isolated   Frequency of Communication with Friends and Family: Three times a week   Frequency of Social  Gatherings with Friends and Family: More than three times a week   Attends Religious Services: Never   Marine scientist or Organizations: No   Attends Archivist Meetings: Never   Marital Status: Divorced    Allergies:  Allergies  Allergen Reactions   Levofloxacin Nausea Only and Other (See Comments)    "Creepy" feeling, skin didn't feel right, sort of itchy.   Ciprofloxacin     Sweating   Sulfa Antibiotics     Metabolic Disorder Labs: Lab Results  Component Value Date   HGBA1C 5.3 06/04/2020   MPG 111.15 12/30/2017   MPG 111 10/22/2016   No results found for: PROLACTIN Lab Results  Component Value Date   CHOL 163 06/20/2021   TRIG 48 06/20/2021   HDL 78 06/20/2021   CHOLHDL 2.1 06/20/2021   VLDL 27 10/22/2016   LDLCALC 75 06/20/2021   LDLCALC 74 10/15/2020   Lab Results  Component Value Date   TSH 2.320 04/17/2021   TSH 2.01 05/03/2019    Therapeutic Level Labs: No results found for: LITHIUM No results found for: VALPROATE No components found for:  CBMZ  Current Medications: Current Outpatient Medications  Medication Sig Dispense Refill   AMBULATORY NON FORMULARY MEDICATION Kratom Take 2 tablet by mouth twice daily     AMBULATORY NON FORMULARY MEDICATION Friska caffiene 1-2 tablet twice daily     Ascorbic Acid (VITAMIN C WITH ROSE HIPS) 500 MG tablet Take 500 mg by mouth daily.      aspirin EC 81 MG tablet Take 81 mg by mouth daily.     atorvastatin (LIPITOR) 10 MG tablet TAKE (1) TABLET BY MOUTH ONCE DAILY. 30 tablet 5   cloNIDine (CATAPRES) 0.1 MG tablet Take 1 tablet (0.1 mg total) by mouth daily. 90 tablet 3   famotidine (PEPCID) 20 MG tablet TAKE 1 TABLET BY MOUTH TWICE DAILY. TAKE WITH BREAKFAST AND AT BEDTIME. 60 tablet 3   ibuprofen (ADVIL) 200 MG tablet Take 400 mg by mouth 2 (two) times daily.     lansoprazole (PREVACID) 30 MG capsule Take 1 capsule (30 mg total) by mouth 2 (two) times daily before a meal. 60 capsule 5    lubiprostone (AMITIZA) 24 MCG capsule TAKE 1 CAPSULE TWICE DAILY WITH MEALS. 60 capsule 5   mirtazapine (REMERON) 15 MG tablet TAKE (1) TABLET BY MOUTH AT BEDTIME. 30 tablet 5   Multiple Vitamin (MULTIVITAMIN WITH MINERALS) TABS tablet Take 1 tablet by mouth daily.     Nutritional Supplements (KETO PO) Take 1 tablet by mouth in the morning, at noon, and at bedtime.      Probiotic Product (PROBIOTIC DAILY PO) Take by mouth daily.     RABEprazole (ACIPHEX) 20 MG tablet Take 20 mg by mouth daily.     senna (SENOKOT) 8.6 MG tablet Take 2 tablets by mouth 2 (two) times daily.     Turmeric 400 MG CAPS Take 800 mg by mouth daily.  Vitamin D3 (VITAMIN D) 25 MCG tablet Take 4,000 Units by mouth daily.      No current facility-administered medications for this visit.     Musculoskeletal: Strength & Muscle Tone: na Gait & Station: na Patient leans: N/A  Psychiatric Specialty Exam: Review of Systems  Constitutional:  Positive for fatigue.  HENT:  Positive for trouble swallowing.   Musculoskeletal:  Positive for arthralgias and myalgias.  Psychiatric/Behavioral:  Positive for dysphoric mood.   All other systems reviewed and are negative.  There were no vitals taken for this visit.There is no height or weight on file to calculate BMI.  General Appearance: NA  Eye Contact:  NA  Speech:  Clear and Coherent  Volume:  Normal  Mood:  Dysphoric  Affect:  NA  Thought Process:  Goal Directed  Orientation:  Full (Time, Place, and Person)  Thought Content: Rumination   Suicidal Thoughts:  No  Homicidal Thoughts:  No  Memory:  Immediate;   Good Recent;   Good Remote;   Good  Judgement:  Good  Insight:  Good  Psychomotor Activity:  Normal  Concentration:  Concentration: Good and Attention Span: Good  Recall:  Good  Fund of Knowledge: Good  Language: Good  Akathisia:  No  Handed:  Right  AIMS (if indicated): not done  Assets:  Communication Skills Desire for  Improvement Resilience Social Support Talents/Skills  ADL's:  Intact  Cognition: WNL  Sleep:  Fair   Screenings: GAD-7    Flowsheet Row Office Visit from 07/17/2020 in [facility] Office Visit from 06/04/2020 in [facility]  Total GAD-7 Score 4 5      PHQ2-9    Flowsheet Row Video Visit from 10/02/2021 in [place] [facility] Office Visit from 09/30/2021 in [facility] Office Visit from 06/20/2021 in [place] from 03/07/2021 in [facility] Video Visit from 01/15/2021 in [facility]  PHQ-2 Total Score 1 1 0 0 1  PHQ-9 Total Score -- 10 0 -- --      Flowsheet Row Video Visit from 10/02/2021 in [facility] Video Visit from 01/15/2021 in [place] Office Visit from 06/04/2020 in [facility]  C-SSRS RISK CATEGORY Low Risk No Risk Error: Q3, 4, or 5 should not be populated when Q2 is No        Assessment and Plan: This patient is a 66 year old male with a history of alcohol abuse now resolved depression and anxiety.  The only psychiatric medication he takes is mirtazapine 15 mg at bedtime for depression and sleep.  He really does not want to add anything else.  Given all of his symptoms I think he needs to see rheumatology and I will message his primary care physician about this.  He will return to see me in 3  Collaboration of Care: Collaboration of Care: Primary Care Provider AEB I will message PCP about referral to dermatology  Patient/Guardian was advised Release of Information must be obtained prior to any record release in order to collaborate their care with an outside provider. Patient/Guardian was advised if they have not already done so to contact the registration department to sign all necessary forms in order for [name] to release  information regarding their care.   Consent: Patient/Guardian gives verbal consent for treatment and assignment of benefits for services provided during this visit. Patient/Guardian expressed understanding and agreed to proceed.    Neoma Laming  Harrington Challenger, MD 10/02/2021, 11:21 AM

## 2021-10-03 LAB — LYME DISEASE SEROLOGY W/REFLEX: Lyme Total Antibody EIA: NEGATIVE

## 2021-10-08 ENCOUNTER — Other Ambulatory Visit: Payer: Self-pay

## 2021-10-08 ENCOUNTER — Telehealth: Payer: Self-pay | Admitting: Family Medicine

## 2021-10-08 NOTE — Telephone Encounter (Signed)
Pls let pt know I have a msg from dr Harrington Challenger recommending eval for possible sjorgen's synd ,  ?I will refer him to rheumatology in [facility] for evaluation for this if he agrees, let me know his response please, should arrange phone visit for this with me before referral done , thanks ?

## 2021-10-09 NOTE — Telephone Encounter (Signed)
Pt aware and sch for tomorrow for tele visit  ?

## 2021-10-10 ENCOUNTER — Other Ambulatory Visit: Payer: Self-pay

## 2021-10-10 ENCOUNTER — Ambulatory Visit (INDEPENDENT_AMBULATORY_CARE_PROVIDER_SITE_OTHER): Payer: Medicare HMO | Admitting: Family Medicine

## 2021-10-10 ENCOUNTER — Encounter: Payer: Self-pay | Admitting: Family Medicine

## 2021-10-10 ENCOUNTER — Other Ambulatory Visit: Payer: Self-pay | Admitting: Family Medicine

## 2021-10-10 DIAGNOSIS — R682 Dry mouth, unspecified: Secondary | ICD-10-CM | POA: Diagnosis not present

## 2021-10-10 DIAGNOSIS — M791 Myalgia, unspecified site: Secondary | ICD-10-CM

## 2021-10-10 DIAGNOSIS — R768 Other specified abnormal immunological findings in serum: Secondary | ICD-10-CM | POA: Insufficient documentation

## 2021-10-10 DIAGNOSIS — H04123 Dry eye syndrome of bilateral lacrimal glands: Secondary | ICD-10-CM | POA: Diagnosis not present

## 2021-10-10 DIAGNOSIS — R5383 Other fatigue: Secondary | ICD-10-CM

## 2021-10-10 DIAGNOSIS — E785 Hyperlipidemia, unspecified: Secondary | ICD-10-CM

## 2021-10-10 DIAGNOSIS — R7689 Other specified abnormal immunological findings in serum: Secondary | ICD-10-CM

## 2021-10-10 NOTE — Assessment & Plan Note (Signed)
Positive tieer in feb 2023, refer to rheumatology ?

## 2021-10-10 NOTE — Progress Notes (Signed)
Virtual Visit via Telephone Note ? ?I connected with Marc [name] on 10/10/21 at  4:40 PM EST by telephone and verified that I am speaking with the correct person using two identifiers. ? ?Location: ?Patient: home ?Provider: office ?  ?I discussed the limitations, risks, security and privacy concerns of performing an evaluation and management service by telephone and the availability of in person appointments. I also discussed with the patient that there may be a patient responsible charge related to this service. The patient expressed understanding and agreed to proceed. ? ? ?History of Present Illness: ?Reports approx 1 year h/o dry eyes , dry mouth , fatigue and myalgias ?Oral surgery at [facility] has abn labs from 09/24/2021, positive ANA and elevated eSR. Question of Sjorgen's . ?Discussed update with Marc [name] who is interested in esatablishing a diagnosis if he has one , since he is symptomatic, so I will refer him to rheumatology in [place] ?  ?Observations/Objective: ?There were no vitals taken for this visit. ?Good communication with no confusion and intact memory. ?Alert and oriented x 3 ?No signs of respiratory distress during speech ? ? ?Assessment and Plan: ?Dry mouth and eyes ?Symptomatic, oral surgeon recommends eval for Sjorgen's and has abn ANA and ESR will refer ? ?Positive ANA (antinuclear antibody) ?Positive tieer in feb 2023, refer to rheumatology ? ? ?Follow Up Instructions: ? ?  ?I discussed the assessment and treatment plan with the patient. The patient was provided an opportunity to ask questions and all were answered. The patient agreed with the plan and demonstrated an understanding of the instructions. ?  ?The patient was advised to call back or seek an in-person evaluation if the symptoms worsen or if the condition fails to improve as anticipated. ? ?I provided 14 minutes of non-face-to-face time during this encounter. ? ? ?Tula Nakayama, MD ? ?

## 2021-10-10 NOTE — Assessment & Plan Note (Signed)
Symptomatic, oral surgeon recommends eval for Sjorgen's and has abn ANA and ESR will refer ?

## 2021-10-10 NOTE — Patient Instructions (Signed)
F/Zu as before, call if you need me sooner ? ?You are referred to Rheumatology as we discussed ?Marland Kitchen ?Thanks for choosing [facility], we consider it a privelige to serve you. ? ?

## 2021-11-05 DIAGNOSIS — M359 Systemic involvement of connective tissue, unspecified: Secondary | ICD-10-CM | POA: Diagnosis not present

## 2021-11-05 DIAGNOSIS — R682 Dry mouth, unspecified: Secondary | ICD-10-CM | POA: Diagnosis not present

## 2021-11-13 ENCOUNTER — Other Ambulatory Visit: Payer: Self-pay | Admitting: Internal Medicine

## 2021-11-13 ENCOUNTER — Other Ambulatory Visit: Payer: Self-pay | Admitting: Family Medicine

## 2021-11-13 DIAGNOSIS — E785 Hyperlipidemia, unspecified: Secondary | ICD-10-CM

## 2021-11-15 ENCOUNTER — Other Ambulatory Visit: Payer: Self-pay | Admitting: Internal Medicine

## 2021-11-17 ENCOUNTER — Telehealth: Payer: Self-pay

## 2021-11-17 NOTE — Telephone Encounter (Signed)
Patient called is completely out need today if possible. Can not wait til tomorrow. ? ?atorvastatin (LIPITOR) 10 MG tablet  ? ?Pharmacy: [facility] ?

## 2021-11-17 NOTE — Telephone Encounter (Signed)
Medication sent to pharmacy  

## 2021-11-17 NOTE — Telephone Encounter (Signed)
Last ov 01/01/21 ?

## 2021-11-21 ENCOUNTER — Other Ambulatory Visit: Payer: Self-pay | Admitting: Gastroenterology

## 2021-11-21 NOTE — Telephone Encounter (Signed)
Last ov 01/01/21 ?

## 2021-11-24 ENCOUNTER — Telehealth: Payer: Self-pay | Admitting: Internal Medicine

## 2021-11-24 NOTE — Telephone Encounter (Signed)
Pt is calling to follow up on his refill of pepcid to be sent to [facility]. I told him the nurse was aware and we were waiting for LSL to review. Please call him when prescription has been sent. [phone] ?

## 2021-11-24 NOTE — Telephone Encounter (Signed)
Refill request was sent by Tammy to Dr. Abbey Chatters this morning as he was the last to see patient. I have went ahead and refilled. ?

## 2021-11-24 NOTE — Telephone Encounter (Signed)
Refill done for Dr. Abbey Chatters as patient has called in requesting. ?

## 2021-11-24 NOTE — Telephone Encounter (Signed)
Refill request was sent to [facility] to review this morning.  ?

## 2021-11-25 NOTE — Telephone Encounter (Signed)
Pt was made aware.  

## 2021-12-06 ENCOUNTER — Other Ambulatory Visit: Payer: Self-pay | Admitting: Gastroenterology

## 2021-12-15 ENCOUNTER — Other Ambulatory Visit: Payer: Self-pay | Admitting: Internal Medicine

## 2021-12-18 ENCOUNTER — Ambulatory Visit (INDEPENDENT_AMBULATORY_CARE_PROVIDER_SITE_OTHER): Payer: Medicare HMO | Admitting: Family Medicine

## 2021-12-18 ENCOUNTER — Encounter: Payer: Self-pay | Admitting: Family Medicine

## 2021-12-18 VITALS — BP 124/82 | HR 70 | Resp 14 | Ht 68.0 in | Wt 141.0 lb

## 2021-12-18 DIAGNOSIS — F32A Depression, unspecified: Secondary | ICD-10-CM

## 2021-12-18 DIAGNOSIS — H04123 Dry eye syndrome of bilateral lacrimal glands: Secondary | ICD-10-CM

## 2021-12-18 DIAGNOSIS — F419 Anxiety disorder, unspecified: Secondary | ICD-10-CM

## 2021-12-18 DIAGNOSIS — R682 Dry mouth, unspecified: Secondary | ICD-10-CM | POA: Diagnosis not present

## 2021-12-18 DIAGNOSIS — I10 Essential (primary) hypertension: Secondary | ICD-10-CM | POA: Diagnosis not present

## 2021-12-18 MED ORDER — AMLODIPINE BESYLATE 2.5 MG PO TABS: 2.5000 mg | ORAL_TABLET | Freq: Every day | ORAL | 2 refills | Status: DC

## 2021-12-18 NOTE — Assessment & Plan Note (Signed)
Reports increased anxiety with dry mouth which impacts life negatively, difficulty with speech, swallowing  ?

## 2021-12-18 NOTE — Assessment & Plan Note (Signed)
Controlled but changing to amlodipine , hopefully this will help the dry mouth ?DASH diet and commitment to daily physical activity for a minimum of 30 minutes discussed and encouraged, as a part of hypertension management. ?The importance of attaining a healthy weight is also discussed. ? ? ?  12/18/2021  ? 12:14 PM 06/20/2021  ?  8:35 AM 06/20/2021  ?  8:10 AM 04/17/2021  ?  9:42 AM 01/01/2021  ? 10:11 AM 10/30/2020  ?  9:43 AM 10/15/2020  ?  2:31 PM  ?BP/Weight  ?Systolic BP 103 159 458 592 111 116 130  ?Diastolic BP 82 70 68 69 69 62 70  ?Wt. (Lbs) 141  141.04 140 136 137 135  ?BMI 21.44 kg/m2  21.45 kg/m2 21.29 kg/m2 20.68 kg/m2 20.83 kg/m2 20.38 kg/m2  ? ? ? ? ?

## 2021-12-18 NOTE — Patient Instructions (Addendum)
F/U in 7 weeks re evaluate blood pressure, call if you ned me sooner ? ?Cmp and EGFR 1 week before next visit ? ?Good that you exercise daily ? ?Hoping dry mouth improve s with change in BP medication ? ?Stop clonidine ? ?New for bP is amlodipine 2.5 mg daily ? ?Thanks for choosing [facility], we consider it a privelige to serve you. ? ?

## 2021-12-18 NOTE — Progress Notes (Signed)
? ?  Marc [name]     MRN: [id number]      DOB: 1955-12-14 ? ? ?HPI ?[name] is here for follow up and re-evaluation of chronic medical conditions, medication management and review of any available recent lab and radiology data.  ?Preventive health is updated, specifically  Cancer screening and Immunization.   ?Questions or concerns regarding consultations or procedures which the PT has had in the interim are  addressed. ?The PT denies any adverse reactions to current medications since the last visit.  ?C/o excessive dry mouth, being treated for Srogen's ?ROS ?Denies recent fever or chills. ?Denies sinus pressure, nasal congestion, ear pain or sore throat. ?Denies chest congestion, productive cough or wheezing. ?Denies chest pains, palpitations and leg swelling ?Denies abdominal pain, nausea, vomiting,diarrhea or constipation.   ?Denies dysuria, frequency, hesitancy or incontinence. ?Denies joint pain, swelling and limitation in mobility. ?Denies headaches, seizures, numbness, or tingling. ?Increased anxiety due to dry mouth ?Celebrating 3 years sobriety ?Denies skin break down or rash. ? ? ?PE ?BP 124/82   Pulse 70   Resp 14   Ht '5\' 8"'$  (1.727 m)   Wt 141 lb (64 kg)   SpO2 96%   BMI 21.44 kg/m?  ? ? ? ?. ? ?Patient alert and oriented and in no cardiopulmonary distress. ? ?HEENT: No facial asymmetry, EOMI,     Neck supple . ? ?Chest: Clear to auscultation bilaterally. ? ?CVS: S1, S2 no murmurs, no S3.Regular rate. ? ?ABD: Soft non tender.  ? ?Ext: No edema ? ?MS: Adequate ROM spine, shoulders, hips and knees. ? ?Skin: Intact, no ulcerations or rash noted. ? ?Psych: Good eye contact, normal affect. Memory intact not anxious or depressed appearing. ? ?CNS: CN 2-12 intact, power,  normal throughout.no focal deficits noted. ? ? ?Assessment & Plan ? ?Essential hypertension ?Controlled but changing to amlodipine , hopefully this will help the dry mouth ?DASH diet and commitment to daily physical activity for a  minimum of 30 minutes discussed and encouraged, as a part of hypertension management. ?The importance of attaining a healthy weight is also discussed. ? ? ?  12/18/2021  ? 12:14 PM 06/20/2021  ?  8:35 AM 06/20/2021  ?  8:10 AM 04/17/2021  ?  9:42 AM 01/01/2021  ? 10:11 AM 10/30/2020  ?  9:43 AM 10/15/2020  ?  2:31 PM  ?BP/Weight  ?Systolic BP 194 174 081 448 111 116 130  ?Diastolic BP 82 70 68 69 69 62 70  ?Wt. (Lbs) 141  141.04 140 136 137 135  ?BMI 21.44 kg/m2  21.45 kg/m2 21.29 kg/m2 20.68 kg/m2 20.83 kg/m2 20.38 kg/m2  ? ? ? ? ? ?Dry mouth and eyes ?Being treated with pilocarpine, no significant improvement in symptom ? ?Anxiety and depression ?Reports increased anxiety with dry mouth which impacts life negatively, difficulty with speech, swallowing  ? ?

## 2021-12-18 NOTE — Assessment & Plan Note (Signed)
Being treated with pilocarpine, no significant improvement in symptom ?

## 2021-12-23 ENCOUNTER — Other Ambulatory Visit: Payer: Self-pay

## 2021-12-23 DIAGNOSIS — E785 Hyperlipidemia, unspecified: Secondary | ICD-10-CM

## 2021-12-23 MED ORDER — ATORVASTATIN CALCIUM 10 MG PO TABS: ORAL_TABLET | ORAL | 2 refills | Status: DC

## 2022-01-07 ENCOUNTER — Telehealth: Payer: Self-pay | Admitting: Family Medicine

## 2022-01-07 ENCOUNTER — Other Ambulatory Visit: Payer: Self-pay | Admitting: Internal Medicine

## 2022-01-07 ENCOUNTER — Ambulatory Visit (INDEPENDENT_AMBULATORY_CARE_PROVIDER_SITE_OTHER): Payer: Medicare HMO | Admitting: Internal Medicine

## 2022-01-07 ENCOUNTER — Encounter: Payer: Self-pay | Admitting: Internal Medicine

## 2022-01-07 DIAGNOSIS — B370 Candidal stomatitis: Secondary | ICD-10-CM

## 2022-01-07 MED ORDER — NYSTATIN 100000 UNIT/ML MT SUSP: 5.0000 mL | Freq: Four times a day (QID) | OROMUCOSAL | 0 refills | Status: DC

## 2022-01-07 NOTE — Telephone Encounter (Signed)
Called in regard to Srogen syndrome, Patient mouth currently covered in thrush  Wants to see if provider can send in med to help

## 2022-01-07 NOTE — Telephone Encounter (Signed)
Was seen by provider today

## 2022-01-07 NOTE — Progress Notes (Signed)
Virtual Visit via Telephone Note   This visit type was conducted due to national recommendations for restrictions regarding the COVID-19 Pandemic (e.g. social distancing) in an effort to limit this patient's exposure and mitigate transmission in our community.  Due to his co-morbid illnesses, this patient is at least at moderate risk for complications without adequate follow up.  This format is felt to be most appropriate for this patient at this time.  The patient did not have access to video technology/had technical difficulties with video requiring transitioning to audio format only (telephone).  All issues noted in this document were discussed and addressed.  No physical exam could be performed with this format.  Evaluation Performed:  Follow-up visit  Date:  01/07/2022   ID:  Marc [name], Marc [name] September 22, 1955, MRN [id number]  Patient Location: Home Provider Location: Office/Clinic  Participants: Patient Location of Patient: Home Location of Provider: Telehealth Consent was obtain for visit to be over via telehealth. I verified that I am speaking with the correct person using two identifiers.  PCP:  Fayrene Helper, MD   Chief Complaint: Sore mouth and oral thrush  History of Present Illness:    Marc [name] is a 66 y.o. male who has a televisit for complaint of soreness in his mouth and oral thrush for the last 1 week.  He has noticed whitish patch on his tongue, which he is not able to scrape off.  He has history of Sjogren syndrome and has been followed by oral surgeon currently.  He has tried pilocarpine, but did not help him.  The patient does not have symptoms concerning for COVID-19 infection (fever, chills, cough, or new shortness of breath).   Past Medical, Surgical, Social History, Allergies, and Medications have been Reviewed.  Past Medical History:  Diagnosis Date   Acute encephalopathy 12/29/2017   Hospitalized 5/22 to 5/23 with acute encephalopathy, unclear  etiology   Acute gout involving toe of right foot 09/14/2019   Alcoholism ([place])    Allergy    Anxiety    Arthritis    Back problem    Chest wall pain 03/28/2017   Chronic hepatitis C without hepatic coma ([place]) 01/04/2009   Qualifier: Diagnosis of  By: Westly Pam. 2008: CT A/P NO CIRRHOSIS AUG 2013 MRCP- DILATED CBD/NO CIRRHOSIS DEC 2013 AST 99-102  ALT 145-156 PLT 147 HB 13.4 ALB 4.4-4.7 T BIL 0.7    Depression    Dilation of biliary tract 07/14/2012   EUS JAN 2014 BENIGN CBD DILATION    Epigastric pain 07/14/2012   GERD (gastroesophageal reflux disease)    Gout    HCV (hepatitis C virus) 06/16/2015   Hepatitis B    Hepatitis C 1979   Hepatitis C reactive    LIVER BX 2008-CHRONIC ACTIVE HEPATITIS   HTN (hypertension)    Hx of adenomatous colonic polyps 2008   due for surveillance 2017   Hypercholesterolemia    IBS (irritable bowel syndrome)    Irritable bowel syndrome 01/04/2009   Qualifier: Diagnosis of  By: Westly Pam.    Knee pain    Left forearm pain 08/15/2017   Leukopenia 74/25/9563   Lichen planus    Narcotic addiction ([place]) 06/10/2011   [facility]- Oct 2012    Normal cardiac stress test 05/25/2011   [facility] [place]   Normal echocardiogram 05/25/2011   EF 55%, mild TR   Oral lichen planus    Plaque  psoriasis    RUQ pain 07/14/2012   Status post dilation of esophageal narrowing    Substance abuse ([place])    narcotic addiction   Thrombocytopenia ([place]) 11/06/2015   Trigger middle finger of left hand 06/20/2012   Past Surgical History:  Procedure Laterality Date   24 HOUR [place] STUDY  10/21/2020   Procedure: [place] STUDY;  Surgeon: Mauri Pole, MD;  Location: [facility];  Service: Endoscopy;;   APPENDECTOMY     BALLOON DILATION N/A 05/28/2020   Procedure: [place];  Surgeon: Eloise Harman, DO;  [facility];  Service: Endoscopy;  Laterality: N/A;   BIOPSY  05/28/2020    Procedure: BIOPSY;  Surgeon: Eloise Harman, DO;  [facility];  Service: Endoscopy;;   CARPAL TUNNEL RELEASE     rt   COLONOSCOPY  2008 [facility] ARS D100 V8 PHEN 12.5   2 SIMPLE ADENOMAS (< 1 CM)   COLONOSCOPY WITH PROPOFOL N/A 12/05/2019   TI normal, 3 mm polyp in ascending colon, external and internal hemorrhoids. Polyp removed but not retrieved. Poor prep   ESOPHAGEAL MANOMETRY N/A 10/21/2020   Procedure: ESOPHAGEAL MANOMETRY (EM);  Surgeon: Mauri Pole, MD;  Location: [facility];  Service: Endoscopy;  Laterality: N/A;   ESOPHAGOGASTRODUODENOSCOPY  04/26/2012   Dr. Oneida Alar: Non-erosive gastritis (inflammation) was found in the gastric antrum but no H.pylori; multiple biopsies (duodenal bx negative for Celiac)/ The mucosa of the esophagus appeared normal   ESOPHAGOGASTRODUODENOSCOPY (EGD) WITH PROPOFOL N/A 12/05/2019   mild gastritis, duodenitis due to aspirin/NSAIDs.    ESOPHAGOGASTRODUODENOSCOPY (EGD) WITH PROPOFOL N/A 05/28/2020   Procedure: ESOPHAGOGASTRODUODENOSCOPY (EGD) WITH PROPOFOL;  Surgeon: Eloise Harman, DO;  [facility];  Service: Endoscopy;  Laterality: N/A;  2:30pm   EUS  09/08/2012   Dr. Ardis Hughs: CBD dilated but no stones. Query secondary to Sphincter of Oddi stenosis, ?dysfunction, but clinically without symptoms   FRACTURE SURGERY N/A    [employer] 08/25/2020   HARDWARE REMOVAL Right 02/12/2014   Procedure: HARDWARE REMOVAL;  Surgeon: Jolyn Nap, MD;  Location: [facility];  Service: Orthopedics;  Laterality: Right;   KNEE ARTHROSCOPY     Neurostimulator implant     POLYPECTOMY  12/05/2019   Procedure: POLYPECTOMY;  Surgeon: Danie Binder, MD;  [facility];  Service: Endoscopy;;   right ring finger     spinal stenosis, had screws put in neck  04/11/2009   DR KRITZER   TONSILLECTOMY     ULNA OSTEOTOMY Right 02/12/2014   Procedure: RIGHT ULNAR SHORTENING AND OSTEOTOMY ;  Surgeon: Jolyn Nap, MD;  Location:  [facility];  Service: Orthopedics;  Laterality: Right;   ULNAR NERVE TRANSPOSITION     UPPER GASTROINTESTINAL ENDOSCOPY  2008 SLF ABD PAIN WEIGHT LOSS d100 v8 phen 12.5   NL   WRIST SURGERY Right jan 2015   Dr. Edmonia Lynch     Current Meds  Medication Sig   AMBULATORY NON FORMULARY MEDICATION Kratom Take 2 tablet by mouth twice daily   AMBULATORY NON FORMULARY MEDICATION Friska caffiene 1-2 tablet twice daily   amLODipine (NORVASC) 2.5 MG tablet Take 1 tablet (2.5 mg total) by mouth daily.   Ascorbic Acid (VITAMIN C WITH ROSE HIPS) 500 MG tablet Take 500 mg by mouth daily.    aspirin EC 81 MG tablet Take 81 mg by mouth daily.   atorvastatin (LIPITOR) 10 MG tablet Take 1 tablet by mouth once  daily.   baclofen (LIORESAL) 10 MG tablet TAKE (1/2) TABLET ('5MG'$ ) BY MOUTH TWICE DAILY BEFORE A MEAL.   famotidine (PEPCID) 20 MG tablet TAKE 1 TABLET BY MOUTH TWICE DAILY. TAKE WITH BREAKFAST AND AT BEDTIME.   ibuprofen (ADVIL) 200 MG tablet Take 400 mg by mouth 2 (two) times daily.   lansoprazole (PREVACID) 30 MG capsule Take 1 capsule (30 mg total) by mouth 2 (two) times daily before a meal.   lubiprostone (AMITIZA) 24 MCG capsule TAKE 1 CAPSULE TWICE DAILY WITH MEALS.   Multiple Vitamin (MULTIVITAMIN WITH MINERALS) TABS tablet Take 1 tablet by mouth daily.   Nutritional Supplements (KETO PO) Take 1 tablet by mouth in the morning, at noon, and at bedtime.    pilocarpine (SALAGEN) 5 MG tablet Take 5 mg by mouth in the morning, at noon, and at bedtime.   Probiotic Product (PROBIOTIC DAILY PO) Take by mouth daily.   RABEprazole (ACIPHEX) 20 MG tablet Take 20 mg by mouth daily.   senna (SENOKOT) 8.6 MG tablet Take 2 tablets by mouth 2 (two) times daily.   Turmeric 400 MG CAPS Take 800 mg by mouth daily.    Vitamin D3 (VITAMIN D) 25 MCG tablet Take 4,000 Units by mouth daily.      Allergies:   Levofloxacin, Ciprofloxacin, and Sulfa antibiotics   ROS:   Please see the history of  present illness.     All other systems reviewed and are negative.   Labs/Other Tests and Data Reviewed:    Recent Labs: 04/17/2021: TSH 2.320 06/20/2021: ALT 33   Recent Lipid Panel Lab Results  Component Value Date/Time   CHOL 163 06/20/2021 09:22 AM   TRIG 48 06/20/2021 09:22 AM   HDL 78 06/20/2021 09:22 AM   CHOLHDL 2.1 06/20/2021 09:22 AM   CHOLHDL 3.8 06/04/2020 10:15 AM   LDLCALC 75 06/20/2021 09:22 AM   LDLCALC 140 (H) 06/04/2020 10:15 AM    Wt Readings from Last 3 Encounters:  12/18/21 141 lb (64 kg)  06/20/21 141 lb 0.6 oz (64 kg)  04/17/21 140 lb (63.5 kg)     ASSESSMENT & PLAN:    Oral thrush Has Sjogren syndrome Nystatin swish and swallow Advised to follow-up with oral surgeon for treatment of Sjogren's syndrome Maintain adequate hydration  Time:   Today, I have spent 9 minutes reviewing the chart, including problem list, medications, and with the patient with telehealth technology discussing the above problems.   Medication Adjustments/Labs and Tests Ordered: Current medicines are reviewed at length with the patient today.  Concerns regarding medicines are outlined above.   Tests Ordered: No orders of the defined types were placed in this encounter.   Medication Changes: No orders of the defined types were placed in this encounter.    Note: This dictation was prepared with Dragon dictation along with smaller phrase technology. Similar sounding words can be transcribed inadequately or may not be corrected upon review. Any transcriptional errors that result from this process are unintentional.      Disposition:  Follow up  Signed, Lindell Spar, MD  01/07/2022 3:06 PM     [facility]

## 2022-01-13 ENCOUNTER — Other Ambulatory Visit: Payer: Self-pay | Admitting: Internal Medicine

## 2022-01-13 DIAGNOSIS — B370 Candidal stomatitis: Secondary | ICD-10-CM

## 2022-01-27 NOTE — Progress Notes (Signed)
Office Visit Note  Patient: Marc [name]             Date of Birth: Oct 11, 1955           MRN: [id number]             PCP: Fayrene Helper, MD Referring: Fayrene Helper, MD Visit Date: 02/05/2022 Occupation: @GUAROCC @  Subjective:  New Patient (Initial Visit) and Dry Eye (And Dry mouth/ has had positive ANA )   History of Present Illness: Marc [name] is a 66 y.o. male seen in consultation per request of his PCP.  According to him his symptoms a started about 2 years ago with dry mouth and dry eyes.  He has seen several physicians since then.  He has been under care of an oral surgeon at [facility].  He states he was given lozenges which did not help.  He was given pilocarpine prescription which caused excessive salivation and it lasted only for couple of hours.  He states due to dry mouth he has difficulty talking.  He also has history of frequent thrush and cavities in the past.  He had a recent tooth abscess.  He also has dry eyes.  He states he has cataracts in his both eyes.  He is currently not using any eyedrops.  He also gives history of joint pain for the last 2 years.  He describes discomfort in his upper back, lower back, wrist, hands, trochanteric region, knee joints, ankles and his feet.  He notices some swelling in his ankle joints.  He gives history of morning stiffness.  Morning stiffness improves after doing jumping jacks and some warm ups and stretches per patient.  He walks about 5 to 6 miles daily and feels better after walking but the pain returns.  He had left midfoot fracture in 2020 which causes intermittent discomfort.  He denies history of oral ulcers, nasal ulcers, malar rash, photosensitivity, Raynaud's phenomenon or lymphadenopathy.  There is no family history of autoimmune disease.  Activities of Daily Living:  Patient reports morning stiffness for  all morning.   Patient Reports nocturnal pain.  Difficulty dressing/grooming: Denies Difficulty climbing stairs:  Denies Difficulty getting out of chair: Denies Difficulty using hands for taps, buttons, cutlery, and/or writing: Reports  Review of Systems  Constitutional:  Positive for fatigue and weight loss.  HENT:  Positive for sore throat, voice change, mouth dryness, nose dryness and sore tongue. Negative for mouth sores.   Eyes:  Positive for dryness.  Respiratory:  Negative for cough and shortness of breath.   Cardiovascular:  Positive for swelling in legs/feet. Negative for chest pain, palpitations and irregular heartbeat.  Gastrointestinal:  Positive for constipation and heartburn. Negative for nausea and vomiting.  Endocrine: Positive for cold intolerance. Negative for increased urination.  Genitourinary: Negative.  Negative for difficulty urinating.  Musculoskeletal:  Positive for joint pain, joint pain and morning stiffness.  Skin:  Negative for color change, rash and sensitivity to sunlight.       States burning and itching/ crawling feeling on skin in the morning  Allergic/Immunologic: Negative.  Negative for susceptible to infections.  Neurological:  Positive for headaches. Negative for numbness and weakness.  Hematological:  Negative for swollen glands.  Psychiatric/Behavioral:  Positive for depressed mood and sleep disturbance. The patient is nervous/anxious.     PMFS History:  Patient Active Problem List   Diagnosis Date Noted   Dry mouth and eyes 10/10/2021   Positive ANA (antinuclear antibody)  10/10/2021   Regurgitation of food    Dysphagia 05/09/2020   Hemorrhoids 02/29/2020   Bloating 01/05/2020   Polyp of ascending colon    Rectal bleeding    Nasal drainage 10/12/2019   History of hepatitis C 09/07/2019   Constipation 09/07/2019   Rectal pain 09/07/2019   History of colonic polyps 11/13/2015   MDD (major depressive disorder) 01/21/2015   Seasonal allergies 12/27/2014   Vitamin D deficiency 06/20/2012   Chronic pain syndrome 12/24/2011   Essential hypertension  09/06/2010   Prediabetes 04/10/2010   INSOMNIA 11/07/2009   Anxiety and depression 09/04/2009   GERD 01/03/2009    Past Medical History:  Diagnosis Date   Acute encephalopathy 12/29/2017   Hospitalized 5/22 to 5/23 with acute encephalopathy, unclear etiology   Acute gout involving toe of right foot 09/14/2019   Alcoholism ([place])    Allergy    Anxiety    Arthritis    Back problem    Chest wall pain 03/28/2017   Chronic hepatitis C without hepatic coma ([place]) 01/04/2009   Qualifier: Diagnosis of  By: Westly Pam. 2008: CT A/P NO CIRRHOSIS AUG 2013 MRCP- DILATED CBD/NO CIRRHOSIS DEC 2013 AST 99-102  ALT 145-156 PLT 147 HB 13.4 ALB 4.4-4.7 T BIL 0.7    Depression    Dilation of biliary tract 07/14/2012   EUS JAN 2014 BENIGN CBD DILATION    Epigastric pain 07/14/2012   GERD (gastroesophageal reflux disease)    Gout    HCV (hepatitis C virus) 06/16/2015   Hepatitis B    Hepatitis C 1979   Hepatitis C reactive    LIVER BX 2008-CHRONIC ACTIVE HEPATITIS   HTN (hypertension)    Hx of adenomatous colonic polyps 2008   due for surveillance 2017   Hypercholesterolemia    IBS (irritable bowel syndrome)    Irritable bowel syndrome 01/04/2009   Qualifier: Diagnosis of  By: Westly Pam.    Knee pain    Left forearm pain 08/15/2017   Leukopenia 22/09/5425   Lichen planus    Narcotic addiction ([place]) 06/10/2011   [facility]- Oct 2012    Normal cardiac stress test 05/25/2011   [facility] [place]   Normal echocardiogram 05/25/2011   EF 55%, mild TR   Oral lichen planus    Plaque psoriasis    RUQ pain 07/14/2012   Status post dilation of esophageal narrowing    Substance abuse ([facility])    narcotic addiction   Thrombocytopenia ([place]) 11/06/2015   Trigger middle finger of left hand 06/20/2012    Family History  Problem Relation Age of Onset   Bladder Cancer Mother        rare form    Cancer Sister        Metastatic abdominal   Emotional  abuse Sister    Stroke Sister    Emotional abuse Sister    Heart failure Father    Stroke Father    Alcohol abuse Father    Dementia Paternal Aunt    Alcohol abuse Paternal Uncle    Dementia Paternal Uncle    Dementia Paternal Grandmother    Stroke Paternal Grandmother    ADD / ADHD Cousin    Bipolar disorder Cousin    Anxiety disorder Cousin    Depression Cousin        Committed suicide   Alcohol abuse Cousin    OCD Cousin    Paranoid behavior Cousin    Brain cancer  Maternal Grandfather    Heart defect Other        FAMILY HX   Colon cancer Maternal Uncle    Colon cancer Cousin    Colon polyps Neg Hx    Drug abuse Neg Hx    Schizophrenia Neg Hx    Seizures Neg Hx    Sexual abuse Neg Hx    Physical abuse Neg Hx    Past Surgical History:  Procedure Laterality Date   24 HOUR [place] STUDY  10/21/2020   Procedure: 24 HOUR [place] STUDY;  Surgeon: Mauri Pole, MD;  Location: [facility];  Service: Endoscopy;;   APPENDECTOMY     BALLOON DILATION N/A 05/28/2020   Procedure: [place];  Surgeon: Eloise Harman, DO;  [facility];  Service: Endoscopy;  Laterality: N/A;   BIOPSY  05/28/2020   Procedure: BIOPSY;  Surgeon: Eloise Harman, DO;  [facility];  Service: Endoscopy;;   CARPAL TUNNEL RELEASE     rt   COLONOSCOPY  2008 [facility] ARS D100 V8 PHEN 12.5   2 SIMPLE ADENOMAS (< 1 CM)   COLONOSCOPY WITH PROPOFOL N/A 12/05/2019   TI normal, 3 mm polyp in ascending colon, external and internal hemorrhoids. Polyp removed but not retrieved. Poor prep   ESOPHAGEAL MANOMETRY N/A 10/21/2020   Procedure: ESOPHAGEAL MANOMETRY (EM);  Surgeon: Mauri Pole, MD;  Location: [facility];  Service: Endoscopy;  Laterality: N/A;   ESOPHAGOGASTRODUODENOSCOPY  04/26/2012   Dr. Oneida Alar: Non-erosive gastritis (inflammation) was found in the gastric antrum but no H.pylori; multiple biopsies (duodenal bx negative for Celiac)/ The mucosa of the esophagus appeared  normal   ESOPHAGOGASTRODUODENOSCOPY (EGD) WITH PROPOFOL N/A 12/05/2019   mild gastritis, duodenitis due to aspirin/NSAIDs.    ESOPHAGOGASTRODUODENOSCOPY (EGD) WITH PROPOFOL N/A 05/28/2020   Procedure: ESOPHAGOGASTRODUODENOSCOPY (EGD) WITH PROPOFOL;  Surgeon: Eloise Harman, DO;  [facility];  Service: Endoscopy;  Laterality: N/A;  2:30pm   EUS  09/08/2012   Dr. Ardis Hughs: CBD dilated but no stones. Query secondary to Sphincter of Oddi stenosis, ?dysfunction, but clinically without symptoms   FRACTURE SURGERY N/A    [employer] 08/25/2020   HARDWARE REMOVAL Right 02/12/2014   Procedure: HARDWARE REMOVAL;  Surgeon: Jolyn Nap, MD;  Location: [place];  Service: Orthopedics;  Laterality: Right;   KNEE ARTHROSCOPY     Neurostimulator implant     POLYPECTOMY  12/05/2019   Procedure: POLYPECTOMY;  Surgeon: Danie Binder, MD;  [facility];  Service: Endoscopy;;   right ring finger     spinal stenosis, had screws put in neck  04/11/2009   DR KRITZER   TONSILLECTOMY     ULNA OSTEOTOMY Right 02/12/2014   Procedure: RIGHT ULNAR SHORTENING AND OSTEOTOMY ;  Surgeon: Jolyn Nap, MD;  Location: [place];  Service: Orthopedics;  Laterality: Right;   ULNAR NERVE TRANSPOSITION     UPPER GASTROINTESTINAL ENDOSCOPY  2008 [facility]F ABD PAIN WEIGHT LOSS d100 v8 phen 12.5   NL   WRIST SURGERY Right jan 2015   Dr. Edmonia Lynch   Social History   Social History Narrative   Not on file   Immunization History  Administered Date(s) Administered   Fluad Quad(high Dose 65+) 04/11/2019, 06/04/2020, 04/22/2021   Influenza Split 06/20/2012   Influenza,inj,Quad PF,6+ Mos 04/24/2013, 08/28/2014, 06/13/2015, 08/11/2017   Influenza-Unspecified 04/24/2013, 08/28/2014, 06/13/2015, 08/11/2017   Moderna SARS-COV2 Booster Vaccination 06/27/2020   Moderna Sars-Covid-2 Vaccination 12/05/2019, 01/02/2020, 06/27/2020  PNEUMOCOCCAL CONJUGATE-20 04/22/2021    Pneumococcal Conjugate-13 02/28/2019   Tdap 12/24/2011   Zoster, Live 10/04/2013     Objective: Vital Signs: BP (!) 145/71   Pulse (!) 49   Ht 5' 8"  (1.727 m)   Wt 139 lb (63 kg)   BMI 21.13 kg/m    Physical Exam Vitals and nursing note reviewed.  Constitutional:      Appearance: He is well-developed.  HENT:     Head: Normocephalic and atraumatic.  Eyes:     Conjunctiva/sclera: Conjunctivae normal.     Pupils: Pupils are equal, round, and reactive to light.  Cardiovascular:     Rate and Rhythm: Normal rate and regular rhythm.     Heart sounds: Normal heart sounds.  Pulmonary:     Effort: Pulmonary effort is normal.     Breath sounds: Normal breath sounds.  Abdominal:     General: Bowel sounds are normal.     Palpations: Abdomen is soft.  Musculoskeletal:     Cervical back: Normal range of motion and neck supple.  Skin:    General: Skin is warm and dry.     Capillary Refill: Capillary refill takes less than 2 seconds.  Neurological:     Mental Status: He is alert and oriented to person, place, and time.  Psychiatric:        Behavior: Behavior normal.      Musculoskeletal Exam: C-spine was in good range of motion.  He had painful limited range of motion of the lumbar spine.  Shoulder joints, elbow joints, wrist joints with good range of motion with no synovitis.  He had bilateral PIP and DIP thickening consistent with osteoarthritis.  Hip joints and knee joints were in good range of motion.  He had discomfort range of motion of his left hip joint.  Knee joints had no warmth swelling or effusion.  There was no tenderness over ankles or MTPs.  Bilateral PIP and DIP thickening was noted consistent with osteoarthritis.  CDAI Exam: CDAI Score: -- Patient Global: --; Provider Global: -- Swollen: --; Tender: -- Joint Exam 02/05/2022   No joint exam has been documented for this visit   There is currently no information documented on the homunculus. Go to the Rheumatology  activity and complete the homunculus joint exam.  Investigation: No additional findings.  Imaging: No results found.  Recent Labs: Lab Results  Component Value Date   WBC 4.0 10/15/2020   HGB 13.7 10/15/2020   PLT 140 (L) 10/15/2020   NA 144 10/15/2020   K 4.3 10/15/2020   CL 106 10/15/2020   CO2 24 10/15/2020   GLUCOSE 85 10/15/2020   BUN 25 10/15/2020   CREATININE 0.85 10/15/2020   BILITOT 0.2 06/20/2021   ALKPHOS 69 06/20/2021   AST 33 06/20/2021   ALT 33 06/20/2021   PROT 6.2 06/20/2021   ALBUMIN 4.4 06/20/2021   CALCIUM 9.6 10/15/2020   GFRAA 85 06/04/2020    Speciality Comments: No specialty comments available.  Procedures:  No procedures performed Allergies: Levofloxacin, Ciprofloxacin, and Sulfa antibiotics   Assessment / Plan:     Visit Diagnoses: Positive ANA (antinuclear antibody) - 09/24/21: ANA 1:80 speckled, cytoplasmic, CRP<4, ESR 32, TPO ab-, ENA screen negative, anti-CCP negative -patient has low titer positive ANA speckled pattern.  I will obtain additional antibodies today.  Plan: Urinalysis, Routine w reflex microscopic, Sedimentation rate, ANA, Sjogrens syndrome-A extractable nuclear antibody, Sjogrens syndrome-B extractable nuclear antibody, C3 and C4  Dry mouth and eyes-he complains  of extremely dry mouth.  Frequent cavities and thrush.  He has been seen and oral surgeon at [facility]  He tried pilocarpine and did not like excessive salivation.  He has been using XyliMelts patch and over-the-counter products.  Other over-the-counter products were discussed.  Use of humidifier was discussed.  He is a mouth breather and he runs 4 to 5 miles daily.  Use of sugar-free gum and moisturizers inside the mouth were discussed.  I will obtain additional antibodies today.  Primary osteoarthritis of both hands-he complains of pain and discomfort in his bilateral hands.  No swelling or synovitis was noted.  Clinical findings were consistent with osteoarthritis.  Trigger  finger, left middle finger-he plays guitar which causes trigger finger.  He had injections in the past and is planning to have surgery in the future.  Trigger finger, left ring finger  Pain in left hip -complains of pain and discomfort in his left hip joint.  He had good range of motion.  He has some tenderness over left trochanteric bursa.  Plan: XR HIP UNILAT W OR W/O PELVIS 2-3 VIEWS LEFT.  X-ray of the hip joint was unremarkable.  Chronic pain of both knees -he complains of pain and discomfort and swelling in his bilateral knee joints.  No swelling was noted.  Both knee joints with good range of motion.  Plan: Rheumatoid factor  Primary osteoarthritis of both feet-he complains of discomfort in his bilateral feet.  He gives history of fracture in his left foot in the past.  Clinical findings are consistent with osteoarthritis.  Chronic midline low back pain without sciatica -he complains of lower back pain for many years.  He had limited painful range of motion of his lumbar spine.  Plan: XR Lumbar Spine 2-3 Views.  X-rays showed L4-L5 spondylolisthesis, multilevel spondylosis and facet joint arthropathy.  Chronic pain syndrome-history of chronic pain for many years.  Myalgia -he complains of muscle pain and muscle spasms.  Plan: CK  Other fatigue -history of fatigue for many years.  Plan: CBC with Differential/Platelet, COMPLETE METABOLIC PANEL WITH GFR, Serum protein electrophoresis with reflex, IgG, IgA, IgM  History of gastroesophageal reflux (GERD)-followed by gastroenterologist.  He is on Aciphex, Pepcid and Prevacid.  History of hepatitis C  Recovering alcoholic in remission ([place]) - quit drinking in 2020.  Essential hypertension-systolic blood pressure is a still elevated.  He states he was switched from clonidine to amlodipine due to dry mouth.  Prediabetes  History of colonic polyps  Anxiety and depression  Vitamin D deficiency  Orders: Orders Placed This Encounter   Procedures   XR HIP UNILAT W OR W/O PELVIS 2-3 VIEWS LEFT   XR Lumbar Spine 2-3 Views   CBC with Differential/Platelet   COMPLETE METABOLIC PANEL WITH GFR   Urinalysis, Routine w reflex microscopic   CK   Sedimentation rate   Rheumatoid factor   ANA   Sjogrens syndrome-A extractable nuclear antibody   Sjogrens syndrome-B extractable nuclear antibody   C3 and C4   Serum protein electrophoresis with reflex   IgG, IgA, IgM   No orders of the defined types were placed in this encounter.    Follow-Up Instructions: Return for [place] symptoms, positive ANA.   Bo Merino, MD  Note - This record has been created using Editor, commissioning.  Chart creation errors have been sought, but may not always  have been located. Such creation errors do not reflect on  the standard of medical care.,

## 2022-02-03 ENCOUNTER — Other Ambulatory Visit: Payer: Self-pay | Admitting: Internal Medicine

## 2022-02-05 ENCOUNTER — Ambulatory Visit (INDEPENDENT_AMBULATORY_CARE_PROVIDER_SITE_OTHER): Payer: Medicare HMO

## 2022-02-05 ENCOUNTER — Ambulatory Visit: Payer: Medicare HMO | Admitting: Rheumatology

## 2022-02-05 ENCOUNTER — Encounter: Payer: Self-pay | Admitting: Rheumatology

## 2022-02-05 ENCOUNTER — Ambulatory Visit: Payer: Medicare HMO | Admitting: Family Medicine

## 2022-02-05 VITALS — BP 145/71 | HR 49 | Ht 68.0 in | Wt 139.0 lb

## 2022-02-05 DIAGNOSIS — M25552 Pain in left hip: Secondary | ICD-10-CM | POA: Diagnosis not present

## 2022-02-05 DIAGNOSIS — R7689 Other specified abnormal immunological findings in serum: Secondary | ICD-10-CM

## 2022-02-05 DIAGNOSIS — M25562 Pain in left knee: Secondary | ICD-10-CM

## 2022-02-05 DIAGNOSIS — M791 Myalgia, unspecified site: Secondary | ICD-10-CM | POA: Diagnosis not present

## 2022-02-05 DIAGNOSIS — M545 Low back pain, unspecified: Secondary | ICD-10-CM | POA: Diagnosis not present

## 2022-02-05 DIAGNOSIS — M19041 Primary osteoarthritis, right hand: Secondary | ICD-10-CM

## 2022-02-05 DIAGNOSIS — F419 Anxiety disorder, unspecified: Secondary | ICD-10-CM

## 2022-02-05 DIAGNOSIS — I10 Essential (primary) hypertension: Secondary | ICD-10-CM

## 2022-02-05 DIAGNOSIS — G894 Chronic pain syndrome: Secondary | ICD-10-CM

## 2022-02-05 DIAGNOSIS — R768 Other specified abnormal immunological findings in serum: Secondary | ICD-10-CM | POA: Diagnosis not present

## 2022-02-05 DIAGNOSIS — G8929 Other chronic pain: Secondary | ICD-10-CM | POA: Diagnosis not present

## 2022-02-05 DIAGNOSIS — Z8719 Personal history of other diseases of the digestive system: Secondary | ICD-10-CM

## 2022-02-05 DIAGNOSIS — R682 Dry mouth, unspecified: Secondary | ICD-10-CM | POA: Diagnosis not present

## 2022-02-05 DIAGNOSIS — M5459 Other low back pain: Secondary | ICD-10-CM | POA: Diagnosis not present

## 2022-02-05 DIAGNOSIS — Z860100 Personal history of colon polyps, unspecified: Secondary | ICD-10-CM

## 2022-02-05 DIAGNOSIS — F1021 Alcohol dependence, in remission: Secondary | ICD-10-CM

## 2022-02-05 DIAGNOSIS — M65342 Trigger finger, left ring finger: Secondary | ICD-10-CM

## 2022-02-05 DIAGNOSIS — M19071 Primary osteoarthritis, right ankle and foot: Secondary | ICD-10-CM | POA: Diagnosis not present

## 2022-02-05 DIAGNOSIS — F32A Depression, unspecified: Secondary | ICD-10-CM

## 2022-02-05 DIAGNOSIS — M65332 Trigger finger, left middle finger: Secondary | ICD-10-CM | POA: Diagnosis not present

## 2022-02-05 DIAGNOSIS — R7303 Prediabetes: Secondary | ICD-10-CM

## 2022-02-05 DIAGNOSIS — R5383 Other fatigue: Secondary | ICD-10-CM

## 2022-02-05 DIAGNOSIS — E559 Vitamin D deficiency, unspecified: Secondary | ICD-10-CM

## 2022-02-05 DIAGNOSIS — Z8619 Personal history of other infectious and parasitic diseases: Secondary | ICD-10-CM

## 2022-02-05 DIAGNOSIS — M19042 Primary osteoarthritis, left hand: Secondary | ICD-10-CM

## 2022-02-05 DIAGNOSIS — H04123 Dry eye syndrome of bilateral lacrimal glands: Secondary | ICD-10-CM

## 2022-02-05 DIAGNOSIS — M25561 Pain in right knee: Secondary | ICD-10-CM

## 2022-02-05 DIAGNOSIS — M19072 Primary osteoarthritis, left ankle and foot: Secondary | ICD-10-CM

## 2022-02-09 ENCOUNTER — Telehealth: Payer: Self-pay

## 2022-02-09 ENCOUNTER — Other Ambulatory Visit: Payer: Self-pay | Admitting: Internal Medicine

## 2022-02-09 NOTE — Telephone Encounter (Signed)
Pt called and advised me that he was out of his Amitiza and Baclofen 10 mg tab. His last ov was 01/01/2021. Pt stated he contacted [name] last week but there's no record of it. If pt needs ov please advise

## 2022-02-11 ENCOUNTER — Other Ambulatory Visit: Payer: Self-pay | Admitting: Internal Medicine

## 2022-02-11 DIAGNOSIS — R682 Dry mouth, unspecified: Secondary | ICD-10-CM | POA: Diagnosis not present

## 2022-02-11 NOTE — Telephone Encounter (Signed)
Phoned and LMOVM for the pt regarding both Rx's being sent in (Amitiza and Baclofen)

## 2022-02-11 NOTE — Telephone Encounter (Signed)
Refilled

## 2022-02-12 NOTE — Progress Notes (Signed)
Please have patient come in for aldolase and myomarker panel 3.

## 2022-02-13 ENCOUNTER — Telehealth: Payer: Self-pay | Admitting: *Deleted

## 2022-02-13 DIAGNOSIS — R768 Other specified abnormal immunological findings in serum: Secondary | ICD-10-CM

## 2022-02-13 LAB — CBC WITH DIFFERENTIAL/PLATELET
Absolute Monocytes: 346 cells/uL (ref 200–950)
Basophils Absolute: 32 cells/uL (ref 0–200)
Basophils Relative: 0.6 %
Eosinophils Absolute: 130 cells/uL (ref 15–500)
Eosinophils Relative: 2.4 %
HCT: 40 % (ref 38.5–50.0)
Hemoglobin: 13.4 g/dL (ref 13.2–17.1)
Lymphs Abs: 1042 cells/uL (ref 850–3900)
MCH: 30 pg (ref 27.0–33.0)
MCHC: 33.5 g/dL (ref 32.0–36.0)
MCV: 89.7 fL (ref 80.0–100.0)
MPV: 11.4 fL (ref 7.5–12.5)
Monocytes Relative: 6.4 %
Neutro Abs: 3850 cells/uL (ref 1500–7800)
Neutrophils Relative %: 71.3 %
Platelets: 142 10*3/uL (ref 140–400)
RBC: 4.46 10*6/uL (ref 4.20–5.80)
RDW: 13.2 % (ref 11.0–15.0)
Total Lymphocyte: 19.3 %
WBC: 5.4 10*3/uL (ref 3.8–10.8)

## 2022-02-13 LAB — PROTEIN ELECTROPHORESIS, SERUM, WITH REFLEX
Albumin ELP: 4.2 g/dL (ref 3.8–4.8)
Alpha 1: 0.3 g/dL (ref 0.2–0.3)
Alpha 2: 0.9 g/dL (ref 0.5–0.9)
Beta 2: 0.3 g/dL (ref 0.2–0.5)
Beta Globulin: 0.4 g/dL (ref 0.4–0.6)
Gamma Globulin: 0.7 g/dL — ABNORMAL LOW (ref 0.8–1.7)
Total Protein: 6.8 g/dL (ref 6.1–8.1)

## 2022-02-13 LAB — URINALYSIS, ROUTINE W REFLEX MICROSCOPIC
Bilirubin Urine: NEGATIVE
Glucose, UA: NEGATIVE
Hgb urine dipstick: NEGATIVE
Ketones, ur: NEGATIVE
Leukocytes,Ua: NEGATIVE
Nitrite: NEGATIVE
Protein, ur: NEGATIVE
Specific Gravity, Urine: 1.016 (ref 1.001–1.035)
pH: 6.5 (ref 5.0–8.0)

## 2022-02-13 LAB — SJOGRENS SYNDROME-A EXTRACTABLE NUCLEAR ANTIBODY: SSA (Ro) (ENA) Antibody, IgG: <1 AI

## 2022-02-13 LAB — COMPLETE METABOLIC PANEL WITH GFR
AG Ratio: 1.8 (calc) (ref 1.0–2.5)
ALT: 65 U/L — ABNORMAL HIGH (ref 9–46)
AST: 57 U/L — ABNORMAL HIGH (ref 10–35)
Albumin: 4.4 g/dL (ref 3.6–5.1)
Alkaline phosphatase (APISO): 60 U/L (ref 35–144)
BUN/Creatinine Ratio: 30 (calc) — ABNORMAL HIGH (ref 6–22)
BUN: 26 mg/dL — ABNORMAL HIGH (ref 7–25)
CO2: 28 mmol/L (ref 20–32)
Calcium: 9.6 mg/dL (ref 8.6–10.3)
Chloride: 106 mmol/L (ref 98–110)
Creat: 0.88 mg/dL (ref 0.70–1.35)
Globulin: 2.5 g/dL (calc) (ref 1.9–3.7)
Glucose, Bld: 103 mg/dL — ABNORMAL HIGH (ref 65–99)
Potassium: 4 mmol/L (ref 3.5–5.3)
Sodium: 140 mmol/L (ref 135–146)
Total Bilirubin: 0.3 mg/dL (ref 0.2–1.2)
Total Protein: 6.9 g/dL (ref 6.1–8.1)
eGFR: 94 mL/min/{1.73_m2} (ref >=60)

## 2022-02-13 LAB — IFE INTERPRETATION

## 2022-02-13 LAB — SJOGRENS SYNDROME-B EXTRACTABLE NUCLEAR ANTIBODY: SSB (La) (ENA) Antibody, IgG: <1 AI

## 2022-02-13 LAB — IGG, IGA, IGM
IgG (Immunoglobin G), Serum: 760 mg/dL (ref 600–1540)
IgM, Serum: 57 mg/dL (ref 50–300)
Immunoglobulin A: 122 mg/dL (ref 70–320)

## 2022-02-13 LAB — RHEUMATOID FACTOR: Rheumatoid fact SerPl-aCnc: <14 IU/mL (ref <14)

## 2022-02-13 LAB — CK: Total CK: 532 U/L — ABNORMAL HIGH (ref 44–196)

## 2022-02-13 LAB — C3 AND C4
C3 Complement: 132 mg/dL (ref 82–185)
C4 Complement: 32 mg/dL (ref 15–53)

## 2022-02-13 LAB — SEDIMENTATION RATE: Sed Rate: 2 mm/h (ref 0–20)

## 2022-02-13 LAB — ANTI-NUCLEAR AB-TITER (ANA TITER): ANA Titer 1: 1:640 {titer} — ABNORMAL HIGH

## 2022-02-13 LAB — ANA: Anti Nuclear Antibody (ANA): POSITIVE — AB

## 2022-02-13 NOTE — Telephone Encounter (Signed)
-----   Message from Bo Merino, MD sent at 02/12/2022 12:34 PM EDT ----- Please have patient come in for aldolase and myomarker panel 3.

## 2022-02-16 ENCOUNTER — Other Ambulatory Visit: Payer: Self-pay | Admitting: Family Medicine

## 2022-02-16 ENCOUNTER — Telehealth: Payer: Self-pay | Admitting: Rheumatology

## 2022-02-16 NOTE — Telephone Encounter (Signed)
Spoke with patient and advised patient due to the test Dr. Estanislado Pandy is requesting it would be best for him to come to our office and have them drawn. Patient advised we want to ensure he will have the correct tests done. Patient advised of lab hours. Patient advised he is welcome to come in the morning for labs but we could not guarantee how long he may wait. Patient will come this week for labs.

## 2022-02-16 NOTE — Telephone Encounter (Signed)
Patient called requesting his labwork orders be faxed to Dr. Tula Nakayama (PCP) office.  Patient states he has appointment scheduled for Thursday, 02/19/22 at 9:20 am.   Fax [phone]

## 2022-02-18 ENCOUNTER — Telehealth: Payer: Self-pay | Admitting: *Deleted

## 2022-02-18 DIAGNOSIS — R778 Other specified abnormalities of plasma proteins: Secondary | ICD-10-CM

## 2022-02-18 DIAGNOSIS — R768 Other specified abnormal immunological findings in serum: Secondary | ICD-10-CM

## 2022-02-18 DIAGNOSIS — M791 Myalgia, unspecified site: Secondary | ICD-10-CM

## 2022-02-18 NOTE — Telephone Encounter (Signed)
Patient advised per Dr. Estanislado Pandy, SPEP came back abnormal and will need referral to hematology. Patient advised we will be placing referral. Patient expressed understanding. Also reminded patient to come have additional labs performed this week.

## 2022-02-18 NOTE — Progress Notes (Deleted)
Office Visit Note  Patient: Marc [name]             Date of Birth: 05-29-1956           MRN: [id number]             PCP: Fayrene Helper, MD Referring: Fayrene Helper, MD Visit Date: 03/02/2022 Occupation: _0 @  Subjective:  No chief complaint on file.   History of Present Illness: Marc [name] is a 66 y.o. male ***   Activities of Daily Living:  Patient reports morning stiffness for *** {minute/hour:19697}.   Patient {ACTIONS;DENIES/REPORTS:21021675::"Denies"} nocturnal pain.  Difficulty dressing/grooming: {ACTIONS;DENIES/REPORTS:21021675::"Denies"} Difficulty climbing stairs: {ACTIONS;DENIES/REPORTS:21021675::"Denies"} Difficulty getting out of chair: {ACTIONS;DENIES/REPORTS:21021675::"Denies"} Difficulty using hands for taps, buttons, cutlery, and/or writing: {ACTIONS;DENIES/REPORTS:21021675::"Denies"}  No Rheumatology ROS completed.   PMFS History:  Patient Active Problem List   Diagnosis Date Noted   Dry mouth and eyes 10/10/2021   Positive ANA (antinuclear antibody) 10/10/2021   Regurgitation of food    Dysphagia 05/09/2020   Hemorrhoids 02/29/2020   Bloating 01/05/2020   Polyp of ascending colon    Rectal bleeding    Nasal drainage 10/12/2019   History of hepatitis C 09/07/2019   Constipation 09/07/2019   Rectal pain 09/07/2019   History of colonic polyps 11/13/2015   MDD (major depressive disorder) 01/21/2015   Seasonal allergies 12/27/2014   Vitamin D deficiency 06/20/2012   Chronic pain syndrome 12/24/2011   Essential hypertension 09/06/2010   Prediabetes 04/10/2010   INSOMNIA 11/07/2009   Anxiety and depression 09/04/2009   GERD 01/03/2009    Past Medical History:  Diagnosis Date   Acute encephalopathy 12/29/2017   Hospitalized 5/22 to 5/23 with acute encephalopathy, unclear etiology   Acute gout involving toe of right foot 09/14/2019   Alcoholism ([place])    Allergy    Anxiety    Arthritis    Back problem    Chest wall pain  03/28/2017   Chronic hepatitis C without hepatic coma ([place]) 01/04/2009   Qualifier: Diagnosis of  By: Westly Pam. 2008: CT A/P NO CIRRHOSIS AUG 2013 MRCP- DILATED CBD/NO CIRRHOSIS DEC 2013 AST 99-102  ALT 145-156 PLT 147 HB 13.4 ALB 4.4-4.7 T BIL 0.7    Depression    Dilation of biliary tract 07/14/2012   EUS JAN 2014 BENIGN CBD DILATION    Epigastric pain 07/14/2012   GERD (gastroesophageal reflux disease)    Gout    HCV (hepatitis C virus) 06/16/2015   Hepatitis B    Hepatitis C 1979   Hepatitis C reactive    LIVER BX 2008-CHRONIC ACTIVE HEPATITIS   HTN (hypertension)    Hx of adenomatous colonic polyps 2008   due for surveillance 2017   Hypercholesterolemia    IBS (irritable bowel syndrome)    Irritable bowel syndrome 01/04/2009   Qualifier: Diagnosis of  By: Westly Pam.    Knee pain    Left forearm pain 08/15/2017   Leukopenia 03/47/4259   Lichen planus    Narcotic addiction ([place]) 06/10/2011   [facility]- Oct 2012    Normal cardiac stress test 05/25/2011   [facility] [place]   Normal echocardiogram 05/25/2011   EF 55%, mild TR   Oral lichen planus    Plaque psoriasis    RUQ pain 07/14/2012   Status post dilation of esophageal narrowing    Substance abuse ([facility])    narcotic addiction   Thrombocytopenia ([place]) 11/06/2015   Trigger middle  finger of left hand 06/20/2012    Family History  Problem Relation Age of Onset   Bladder Cancer Mother        rare form    Cancer Sister        Metastatic abdominal   Emotional abuse Sister    Stroke Sister    Emotional abuse Sister    Heart failure Father    Stroke Father    Alcohol abuse Father    Dementia Paternal Aunt    Alcohol abuse Paternal Uncle    Dementia Paternal Uncle    Dementia Paternal Grandmother    Stroke Paternal Grandmother    ADD / ADHD Cousin    Bipolar disorder Cousin    Anxiety disorder Cousin    Depression Cousin        Committed suicide   Alcohol  abuse Cousin    OCD Cousin    Paranoid behavior Cousin    Brain cancer Maternal Grandfather    Heart defect Other        FAMILY HX   Colon cancer Maternal Uncle    Colon cancer Cousin    Colon polyps Neg Hx    Drug abuse Neg Hx    Schizophrenia Neg Hx    Seizures Neg Hx    Sexual abuse Neg Hx    Physical abuse Neg Hx    Past Surgical History:  Procedure Laterality Date   24 HOUR [place] STUDY  10/21/2020   Procedure: 24 HOUR [place] STUDY;  Surgeon: Mauri Pole, MD;  Location: [facility];  Service: Endoscopy;;   APPENDECTOMY     BALLOON DILATION N/A 05/28/2020   Procedure: [place];  Surgeon: Eloise Harman, DO;  [facility];  Service: Endoscopy;  Laterality: N/A;   BIOPSY  05/28/2020   Procedure: BIOPSY;  Surgeon: Eloise Harman, DO;  [facility];  Service: Endoscopy;;   CARPAL TUNNEL RELEASE     rt   COLONOSCOPY  2008 [facility] ARS D100 V8 PHEN 12.5   2 SIMPLE ADENOMAS (< 1 CM)   COLONOSCOPY WITH PROPOFOL N/A 12/05/2019   TI normal, 3 mm polyp in ascending colon, external and internal hemorrhoids. Polyp removed but not retrieved. Poor prep   ESOPHAGEAL MANOMETRY N/A 10/21/2020   Procedure: ESOPHAGEAL MANOMETRY (EM);  Surgeon: Mauri Pole, MD;  Location: [facility];  Service: Endoscopy;  Laterality: N/A;   ESOPHAGOGASTRODUODENOSCOPY  04/26/2012   Dr. Oneida Alar: Non-erosive gastritis (inflammation) was found in the gastric antrum but no H.pylori; multiple biopsies (duodenal bx negative for Celiac)/ The mucosa of the esophagus appeared normal   ESOPHAGOGASTRODUODENOSCOPY (EGD) WITH PROPOFOL N/A 12/05/2019   mild gastritis, duodenitis due to aspirin/NSAIDs.    ESOPHAGOGASTRODUODENOSCOPY (EGD) WITH PROPOFOL N/A 05/28/2020   Procedure: ESOPHAGOGASTRODUODENOSCOPY (EGD) WITH PROPOFOL;  Surgeon: Eloise Harman, DO;  [facility];  Service: Endoscopy;  Laterality: N/A;  2:30pm   EUS  09/08/2012   Dr. Ardis Hughs: CBD dilated but no stones.  Query secondary to Sphincter of Oddi stenosis, ?dysfunction, but clinically without symptoms   FRACTURE SURGERY N/A    [employer] 08/25/2020   HARDWARE REMOVAL Right 02/12/2014   Procedure: HARDWARE REMOVAL;  Surgeon: Jolyn Nap, MD;  Location: [place];  Service: Orthopedics;  Laterality: Right;   KNEE ARTHROSCOPY     Neurostimulator implant     POLYPECTOMY  12/05/2019   Procedure: POLYPECTOMY;  Surgeon: Danie Binder, MD;  [facility];  Service: Endoscopy;;  right ring finger     spinal stenosis, had screws put in neck  04/11/2009   DR KRITZER   TONSILLECTOMY     ULNA OSTEOTOMY Right 02/12/2014   Procedure: RIGHT ULNAR SHORTENING AND OSTEOTOMY ;  Surgeon: Jolyn Nap, MD;  Location: [place];  Service: Orthopedics;  Laterality: Right;   ULNAR NERVE TRANSPOSITION     UPPER GASTROINTESTINAL ENDOSCOPY  2008 [facility] ABD PAIN WEIGHT LOSS d100 v8 phen 12.5   NL   WRIST SURGERY Right jan 2015   Dr. Edmonia Lynch   Social History   Social History Narrative   Not on file   Immunization History  Administered Date(s) Administered   Fluad Quad(high Dose 65+) 04/11/2019, 06/04/2020, 04/22/2021   Influenza Split 06/20/2012   Influenza,inj,Quad PF,6+ Mos 04/24/2013, 08/28/2014, 06/13/2015, 08/11/2017   Influenza-Unspecified 04/24/2013, 08/28/2014, 06/13/2015, 08/11/2017   Moderna SARS-COV2 Booster Vaccination 06/27/2020   Moderna Sars-Covid-2 Vaccination 12/05/2019, 01/02/2020, 06/27/2020   PNEUMOCOCCAL CONJUGATE-20 04/22/2021   Pneumococcal Conjugate-13 02/28/2019   Tdap 12/24/2011   Zoster, Live 10/04/2013     Objective: Vital Signs: There were no vitals taken for this visit.   Physical Exam   Musculoskeletal Exam: ***  CDAI Exam: CDAI Score: -- Patient Global: --; Provider Global: -- Swollen: --; Tender: -- Joint Exam 03/02/2022   No joint exam has been documented for this visit   There is currently no information documented  on the homunculus. Go to the Rheumatology activity and complete the homunculus joint exam.  Investigation: No additional findings.  Imaging: XR HIP UNILAT W OR W/O PELVIS 2-3 VIEWS LEFT  Result Date: 02/05/2022 No hip joint narrowing was noted.  No chondrocalcinosis was noted. Impression: Unremarkable x-ray of the hip joint.  XR Lumbar Spine 2-3 Views  Result Date: 02/05/2022 Multilevel spondylosis with most significant narrowing between L4-L5 and L5-S1 was noted.  L4-L5 spondylolisthesis was noted.  Facet joint arthropathy was noted. Impression: These findings are consistent with multilevel spondylosis and facet joint arthropathy.   Recent Labs: Lab Results  Component Value Date   WBC 5.4 02/05/2022   HGB 13.4 02/05/2022   PLT 142 02/05/2022   NA 140 02/05/2022   K 4.0 02/05/2022   CL 106 02/05/2022   CO2 28 02/05/2022   GLUCOSE 103 (H) 02/05/2022   BUN 26 (H) 02/05/2022   CREATININE 0.88 02/05/2022   BILITOT 0.3 02/05/2022   ALKPHOS 69 06/20/2021   AST 57 (H) 02/05/2022   ALT 65 (H) 02/05/2022   PROT 6.9 02/05/2022   PROT 6.8 02/05/2022   ALBUMIN 4.4 06/20/2021   CALCIUM 9.6 02/05/2022   GFRAA 85 06/04/2020   02/05/22 UA negative, IFE-A poorly-defined area of restricted protein mobility is detected and is reactive with lambda light chain antisera.  Immunoglobulins normal, CK532, ANA 1: 640 cytoplasmic, SSA negative, SSB negative, ESR 2, RF negative, C3-C4 normal  Speciality Comments: No specialty comments available.  Procedures:  No procedures performed Allergies: Levofloxacin, Ciprofloxacin, and Sulfa antibiotics   Assessment / Plan:     Visit Diagnoses: No diagnosis found.  Orders: No orders of the defined types were placed in this encounter.  No orders of the defined types were placed in this encounter.   Face-to-face time spent with patient was *** minutes. Greater than 50% of time was spent in counseling and coordination of care.  Follow-Up Instructions:  No follow-ups on file.   Bo Merino, MD  Note - This record has been created using Editor, commissioning.  Chart creation  errors have been sought, but may not always  have been located. Such creation errors do not reflect on  the standard of medical care.

## 2022-02-19 ENCOUNTER — Ambulatory Visit (INDEPENDENT_AMBULATORY_CARE_PROVIDER_SITE_OTHER): Payer: Medicare HMO | Admitting: Family Medicine

## 2022-02-19 ENCOUNTER — Encounter: Payer: Self-pay | Admitting: Family Medicine

## 2022-02-19 VITALS — BP 120/78 | HR 88 | Ht 68.0 in | Wt 140.0 lb

## 2022-02-19 DIAGNOSIS — R7989 Other specified abnormal findings of blood chemistry: Secondary | ICD-10-CM | POA: Insufficient documentation

## 2022-02-19 DIAGNOSIS — H04123 Dry eye syndrome of bilateral lacrimal glands: Secondary | ICD-10-CM

## 2022-02-19 DIAGNOSIS — I10 Essential (primary) hypertension: Secondary | ICD-10-CM

## 2022-02-19 DIAGNOSIS — K219 Gastro-esophageal reflux disease without esophagitis: Secondary | ICD-10-CM | POA: Diagnosis not present

## 2022-02-19 DIAGNOSIS — R682 Dry mouth, unspecified: Secondary | ICD-10-CM

## 2022-02-19 NOTE — Assessment & Plan Note (Signed)
Advised to stop lipitor and tylenol and will reassess in 5 weeks

## 2022-02-19 NOTE — Assessment & Plan Note (Signed)
Currently undergoing rheumatology evaluation, very debilitating , speech and eating and drinking are a challenge

## 2022-02-19 NOTE — Progress Notes (Signed)
   Marc [name]     MRN: [id number]      DOB: 08/29/1955   HPI Marc [name] is here for follow up and re-evaluation of chronic medical conditions, medication management and review of any available recent lab and radiology data.  Preventive health is updated, specifically  Cancer screening and Immunization.   Questions or concerns regarding consultations or procedures which the PT has had in the interim are  addressed.Still undergoing extensive rheumatology eval for dry mouth with positive ANA  Tolerated the amlodipine with no adverse s/e Has markedly elevated LFT, will stop lipitor and tylenol  Drinks muscle milk, muscle enzymes up. May benefit from nutrition consult as eating and drinking are a challenge  ROS Denies recent fever or chills. Denies sinus pressure, nasal congestion, ear pain or sore throat. Denies chest congestion, productive cough or wheezing. Denies chest pains, palpitations and leg swelling Denies abdominal pain, nausea, vomiting,diarrhea or constipation.   chronic  joint pain, swelling and limitation in mobility. Denies headaches, seizures, numbness, or tingling. Denies uncontrolled  depression, anxiety or insomnia.Attends AA  daily Denies skin break down or rash.   PE  BP 130/70   Pulse 88   Ht '5\' 8"'$  (1.727 m)   Wt 140 lb (63.5 kg)   SpO2 98%   BMI 21.29 kg/m   Patient alert and oriented and in no cardiopulmonary distress.  HEENT: No facial asymmetry, EOMI,     Neck supple .  Chest: Clear to auscultation bilaterally.  CVS: S1, S2 no murmurs, no S3.Regular rate.  ABD: Soft non tender.   Ext: No edema  MS: Adequate ROM spine, shoulders, hips and knees.  Skin: Intact, no ulcerations or rash noted.  Psych: Good eye contact, normal affect. Memory intact not anxious or depressed appearing.  CNS: CN 2-12 intact, power,  normal throughout.no focal deficits noted.   Assessment & Plan  Essential hypertension Controlled, no change in medication DASH  diet and commitment to daily physical activity for a minimum of 30 minutes discussed and encouraged, as a part of hypertension management. The importance of attaining a healthy weight is also discussed.     02/19/2022   10:15 AM 02/19/2022    9:09 AM 02/05/2022    8:05 AM 12/18/2021   12:14 PM 06/20/2021    8:35 AM 06/20/2021    8:10 AM 04/17/2021    9:42 AM  BP/Weight  Systolic BP 975 883 254 982 641 583 094  Diastolic BP 78 70 71 82 70 68 69  Wt. (Lbs)  140 139 141  141.04 140  BMI  21.29 kg/m2 21.13 kg/m2 21.44 kg/m2  21.45 kg/m2 21.29 kg/m2       Dry mouth and eyes Currently undergoing rheumatology evaluation, very debilitating , speech and eating and drinking are a challenge  Elevated LFTs Advised to stop lipitor and tylenol and will reassess in 5 weeks  GERD Continue current meds

## 2022-02-19 NOTE — Patient Instructions (Addendum)
Annual exam 11/12 or after, call if you need me before  Blood pressure  is excellent  STOP atorvastatin and tylenol, and please get fasting lipi,d cmp and EGFR  end August  Please do mention  the muscle milk that you drink at Rheumatology appt  Please let mer know if you want a referral  to nutrition  It is important that you exercise regularly at least 30 minutes 5 times a week. If you develop chest pain, have severe difficulty breathing, or feel very tired, stop exercising immediately and seek medical attention  Thanks for choosing [facility], we consider it a privelige to serve you.

## 2022-02-19 NOTE — Assessment & Plan Note (Signed)
Controlled, no change in medication DASH diet and commitment to daily physical activity for a minimum of 30 minutes discussed and encouraged, as a part of hypertension management. The importance of attaining a healthy weight is also discussed.     02/19/2022   10:15 AM 02/19/2022    9:09 AM 02/05/2022    8:05 AM 12/18/2021   12:14 PM 06/20/2021    8:35 AM 06/20/2021    8:10 AM 04/17/2021    9:42 AM  BP/Weight  Systolic BP 174 081 448 [phone] 026  Diastolic BP 78 70 71 82 70 68 69  Wt. (Lbs)  140 139 141  141.04 140  BMI  21.29 kg/m2 21.13 kg/m2 21.44 kg/m2  21.45 kg/m2 21.29 kg/m2

## 2022-02-19 NOTE — Assessment & Plan Note (Signed)
Continue current meds 

## 2022-02-24 NOTE — Progress Notes (Signed)
 Office Visit Note  Patient: Marc [name]             Date of Birth: 10/10/1955           MRN: [id number]             PCP: Simpson, Margaret E, MD Referring: Simpson, Margaret E, MD Visit Date: 03/05/2022 Occupation: @GUAROCC@  Subjective:  Dry mouth, dry eyes and pain in multiple joints  History of Present Illness: Marc [name] is a 66 y.o. male with history of sicca symptoms and osteoarthritis.  He states he continues to have dry mouth.  He states taking pilocarpine is not helpful as it makes him drooling.  He has tried taking half a tablet which is not effective.  He tried all the over-the-counter products I discussed with him during the last visit and did not like them.  He does not like chewing on the sugar-free gum or lozenges.  He continues to have pain and discomfort in his entire back and his joints.  He has not noticed any joint swelling.  He continues to run and walk which causes discomfort in his joints.  There is no history of oral ulcers, nasal ulcers, malar rash, Raynaud's phenomenon, photosensitivity or lymphadenopathy.  Activities of Daily Living:  Patient reports morning stiffness for several hours.   Patient Denies nocturnal pain.  Difficulty dressing/grooming: Denies Difficulty climbing stairs: Denies Difficulty getting out of chair: Denies Difficulty using hands for taps, buttons, cutlery, and/or writing: Denies  Review of Systems  Constitutional:  Positive for fatigue.  HENT:  Positive for mouth dryness, nose dryness and sore tongue. Negative for mouth sores.   Eyes:  Positive for dryness.  Respiratory:  Negative for shortness of breath.   Cardiovascular:  Negative for chest pain and palpitations.  Gastrointestinal:  Negative for blood in stool, constipation and diarrhea.  Endocrine: Negative for increased urination.  Genitourinary:  Negative for involuntary urination.  Musculoskeletal:  Positive for joint pain, joint pain, myalgias, morning stiffness,  muscle tenderness and myalgias. Negative for gait problem, joint swelling and muscle weakness.  Skin:  Negative for color change, rash, hair loss and sensitivity to sunlight.  Allergic/Immunologic: Negative for susceptible to infections.  Neurological:  Negative for dizziness and headaches.  Hematological:  Negative for swollen glands.  Psychiatric/Behavioral:  Positive for depressed mood and sleep disturbance. The patient is nervous/anxious.     PMFS History:  Patient Active Problem List   Diagnosis Date Noted   Elevated LFTs 02/19/2022   Dry mouth and eyes 10/10/2021   Positive ANA (antinuclear antibody) 10/10/2021   Regurgitation of food    Dysphagia 05/09/2020   Hemorrhoids 02/29/2020   Bloating 01/05/2020   Polyp of ascending colon    Rectal bleeding    Nasal drainage 10/12/2019   History of hepatitis C 09/07/2019   Constipation 09/07/2019   Rectal pain 09/07/2019   History of colonic polyps 11/13/2015   MDD (major depressive disorder) 01/21/2015   Seasonal allergies 12/27/2014   Vitamin D deficiency 06/20/2012   Chronic pain syndrome 12/24/2011   Essential hypertension 09/06/2010   Prediabetes 04/10/2010   INSOMNIA 11/07/2009   Anxiety and depression 09/04/2009   GERD 01/03/2009    Past Medical History:  Diagnosis Date   Acute encephalopathy 12/29/2017   Hospitalized 5/22 to 5/23 with acute encephalopathy, unclear etiology   Acute gout involving toe of right foot 09/14/2019   Alcoholism (HCC)    Allergy    Anxiety    Arthritis      Back problem    Chest wall pain 03/28/2017   Chronic hepatitis C without hepatic coma (HCC) 01/04/2009   Qualifier: Diagnosis of  By: Lewis PA-C, Leslie S. 2008: CT A/P NO CIRRHOSIS AUG 2013 MRCP- DILATED CBD/NO CIRRHOSIS DEC 2013 AST 99-102  ALT 145-156 PLT 147 HB 13.4 ALB 4.4-4.7 T BIL 0.7    Depression    Dilation of biliary tract 07/14/2012   EUS JAN 2014 BENIGN CBD DILATION    Epigastric pain 07/14/2012   GERD (gastroesophageal  reflux disease)    Gout    HCV (hepatitis C virus) 06/16/2015   Hepatitis B    Hepatitis C 1979   Hepatitis C reactive    LIVER BX 2008-CHRONIC ACTIVE HEPATITIS   HTN (hypertension)    Hx of adenomatous colonic polyps 2008   due for surveillance 2017   Hypercholesterolemia    IBS (irritable bowel syndrome)    Irritable bowel syndrome 01/04/2009   Qualifier: Diagnosis of  By: Lewis PA-C, Leslie S.    Knee pain    Left forearm pain 08/15/2017   Leukopenia 11/06/2015   Lichen planus    Narcotic addiction (HCC) 06/10/2011   [facility]- Oct 2012    Normal cardiac stress test 05/25/2011   [facility]   Normal echocardiogram 05/25/2011   EF 55%, mild TR   Oral lichen planus    Plaque psoriasis    RUQ pain 07/14/2012   Status post dilation of esophageal narrowing    Substance abuse (HCC)    narcotic addiction   Thrombocytopenia (HCC) 11/06/2015   Trigger middle finger of left hand 06/20/2012    Family History  Problem Relation Age of Onset   Bladder Cancer Mother        rare form    Cancer Sister        Metastatic abdominal   Emotional abuse Sister    Stroke Sister    Emotional abuse Sister    Heart failure Father    Stroke Father    Alcohol abuse Father    Dementia Paternal Aunt    Alcohol abuse Paternal Uncle    Dementia Paternal Uncle    Dementia Paternal Grandmother    Stroke Paternal Grandmother    ADD / ADHD Cousin    Bipolar disorder Cousin    Anxiety disorder Cousin    Depression Cousin        Committed suicide   Alcohol abuse Cousin    OCD Cousin    Paranoid behavior Cousin    Brain cancer Maternal Grandfather    Heart defect Other        FAMILY HX   Colon cancer Maternal Uncle    Colon cancer Cousin    Colon polyps Neg Hx    Drug abuse Neg Hx    Schizophrenia Neg Hx    Seizures Neg Hx    Sexual abuse Neg Hx    Physical abuse Neg Hx    Past Surgical History:  Procedure Laterality Date   24 HOUR PH STUDY   10/21/2020   Procedure: 24 HOUR PH STUDY;  Surgeon: Nandigam, Kavitha V, MD;  Location: [facility];  Service: Endoscopy;;   APPENDECTOMY     BALLOON DILATION N/A 05/28/2020   Procedure: BALLOON DILATION;  Surgeon: Carver, Charles K, DO;  Location: [facility];  Service: Endoscopy;  Laterality: N/A;   BIOPSY  05/28/2020   Procedure: BIOPSY;  Surgeon: Carver, Charles K, DO;  Location: [facility];    Service: Endoscopy;;   CARPAL TUNNEL RELEASE     rt   COLONOSCOPY  2008 [facility] ARS D100 V8 PHEN 12.5   2 SIMPLE ADENOMAS (< 1 CM)   COLONOSCOPY WITH PROPOFOL N/A 12/05/2019   TI normal, 3 mm polyp in ascending colon, external and internal hemorrhoids. Polyp removed but not retrieved. Poor prep   ESOPHAGEAL MANOMETRY N/A 10/21/2020   Procedure: ESOPHAGEAL MANOMETRY (EM);  Surgeon: Mauri Pole, MD;  Location: [facility];  Service: Endoscopy;  Laterality: N/A;   ESOPHAGOGASTRODUODENOSCOPY  04/26/2012   Dr. Oneida Alar: Non-erosive gastritis (inflammation) was found in the gastric antrum but no H.pylori; multiple biopsies (duodenal bx negative for Celiac)/ The mucosa of the esophagus appeared normal   ESOPHAGOGASTRODUODENOSCOPY (EGD) WITH PROPOFOL N/A 12/05/2019   mild gastritis, duodenitis due to aspirin/NSAIDs.    ESOPHAGOGASTRODUODENOSCOPY (EGD) WITH PROPOFOL N/A 05/28/2020   Procedure: ESOPHAGOGASTRODUODENOSCOPY (EGD) WITH PROPOFOL;  Surgeon: Eloise Harman, DO;  [facility];  Service: Endoscopy;  Laterality: N/A;  2:30pm   EUS  09/08/2012   Dr. Ardis Hughs: CBD dilated but no stones. Query secondary to Sphincter of Oddi stenosis, ?dysfunction, but clinically without symptoms   FRACTURE SURGERY N/A    [employer] 08/25/2020   HARDWARE REMOVAL Right 02/12/2014   Procedure: HARDWARE REMOVAL;  Surgeon: Jolyn Nap, MD;  Location: [place] [facility];  Service: Orthopedics;  Laterality: Right;   KNEE ARTHROSCOPY     Neurostimulator implant     POLYPECTOMY  12/05/2019   Procedure:  POLYPECTOMY;  Surgeon: Danie Binder, MD;  [facility];  Service: Endoscopy;;   right ring finger     spinal stenosis, had screws put in neck  04/11/2009   DR KRITZER   TONSILLECTOMY     ULNA OSTEOTOMY Right 02/12/2014   Procedure: RIGHT ULNAR SHORTENING AND OSTEOTOMY ;  Surgeon: Jolyn Nap, MD;  Location: [place];  Service: Orthopedics;  Laterality: Right;   ULNAR NERVE TRANSPOSITION     UPPER GASTROINTESTINAL ENDOSCOPY  2008 [facility] ABD PAIN WEIGHT LOSS d100 v8 phen 12.5   NL   WRIST SURGERY Right jan 2015   Dr. Edmonia Lynch   Social History   Social History Narrative   Not on file   Immunization History  Administered Date(s) Administered   Fluad Quad(high Dose 65+) 04/11/2019, 06/04/2020, 04/22/2021   Influenza Split 06/20/2012   Influenza,inj,Quad PF,6+ Mos 04/24/2013, 08/28/2014, 06/13/2015, 08/11/2017   Influenza-Unspecified 04/24/2013, 08/28/2014, 06/13/2015, 08/11/2017   Moderna SARS-COV2 Booster Vaccination 06/27/2020   Moderna Sars-Covid-2 Vaccination 12/05/2019, 01/02/2020, 06/27/2020   PNEUMOCOCCAL CONJUGATE-20 04/22/2021   Pneumococcal Conjugate-13 02/28/2019   Tdap 12/24/2011   Zoster, Live 10/04/2013     Objective: Vital Signs: BP 122/67 (BP Location: Left Arm, Patient Position: Sitting, Cuff Size: Normal)   Pulse (!) 51   Ht 5' 8" (1.727 m)   Wt 138 lb 3.2 oz (62.7 kg)   BMI 21.01 kg/m    Physical Exam Vitals and nursing note reviewed.  Constitutional:      Appearance: He is well-developed.  HENT:     Head: Normocephalic and atraumatic.  Eyes:     Conjunctiva/sclera: Conjunctivae normal.     Pupils: Pupils are equal, round, and reactive to light.  Cardiovascular:     Rate and Rhythm: Normal rate and regular rhythm.     Heart sounds: Normal heart sounds.  Pulmonary:     Effort: Pulmonary effort is normal.     Breath sounds: Normal breath sounds.  Abdominal:     General: Bowel sounds are normal.     Palpations:  Abdomen is soft.  Musculoskeletal:     Cervical back: Normal range of motion and neck supple.  Skin:    General: Skin is warm and dry.     Capillary Refill: Capillary refill takes less than 2 seconds.  Neurological:     Mental Status: He is alert and oriented to person, place, and time.  Psychiatric:        Behavior: Behavior normal.      Musculoskeletal Exam: He had good range of motion of the cervical spine.  He had painful limited range of motion of the lumbar spine.  Shoulder joints, elbow joints and wrist joints with good range of motion with no synovitis.  No MCP swelling was noted.  Bilateral PIP and DIP thickening consistent with osteoarthritis was noted.  Hip joints and knee joints with good range of motion.  He had no tenderness over ankles or MTPs.  He had discomfort range of motion of his left hip joint.  CDAI Exam: CDAI Score: -- Patient Global: --; Provider Global: -- Swollen: --; Tender: -- Joint Exam 03/05/2022   No joint exam has been documented for this visit   There is currently no information documented on the homunculus. Go to the Rheumatology activity and complete the homunculus joint exam.  Investigation: No additional findings.  Imaging: XR HIP UNILAT W OR W/O PELVIS 2-3 VIEWS LEFT  Result Date: 02/05/2022 No hip joint narrowing was noted.  No chondrocalcinosis was noted. Impression: Unremarkable x-ray of the hip joint.  XR Lumbar Spine 2-3 Views  Result Date: 02/05/2022 Multilevel spondylosis with most significant narrowing between L4-L5 and L5-S1 was noted.  L4-L5 spondylolisthesis was noted.  Facet joint arthropathy was noted. Impression: These findings are consistent with multilevel spondylosis and facet joint arthropathy.   Recent Labs: Lab Results  Component Value Date   WBC 5.4 02/05/2022   HGB 13.4 02/05/2022   PLT 142 02/05/2022   NA 140 02/05/2022   K 4.0 02/05/2022   CL 106 02/05/2022   CO2 28 02/05/2022   GLUCOSE 103 (H) 02/05/2022    BUN 26 (H) 02/05/2022   CREATININE 0.88 02/05/2022   BILITOT 0.3 02/05/2022   ALKPHOS 69 06/20/2021   AST 57 (H) 02/05/2022   ALT 65 (H) 02/05/2022   PROT 6.9 02/05/2022   PROT 6.8 02/05/2022   ALBUMIN 4.4 06/20/2021   CALCIUM 9.6 02/05/2022   GFRAA 85 06/04/2020   February 05, 2022 UA negative, ANA 1: 640 cytoplasmic, SSA negative, SSB negative, C3-C4 normal, RF negative, IFE showed poorly defined area of restricted protein mobility reactive with lambda light chain antisera, immunoglobulins normal, CK532 Aldolase, myositis panel, pending  09/24/21: ANA 1:80 speckled, cytoplasmic, CRP<4, ESR 32, TPO ab-, ENA screen negative, anti-CCP negative  Speciality Comments: No specialty comments available.  Procedures:  No procedures performed Allergies: Levofloxacin, Ciprofloxacin, and Sulfa antibiotics   Assessment / Plan:     Visit Diagnoses: Positive ANA (antinuclear antibody) - ANA positive, ENA negative, C3-C4 normal. -There is no history of oral ulcers, nasal ulcers, malar rash, photosensitivity, Raynaud's phenomenon, lymphadenopathy, inflammatory arthritis.  Patient was to be referred to [facility] rheumatology for sicca symptoms.  Plan: Ambulatory referral to Rheumatology  Dry mouth and eyes - He gives history of dry mouth, frequent cavities and thrush.  Pilocarpine caused excessive salivation in the past.  He has been using over-the-counter products.  I had a detailed discussion with the  patient during the last visit about over-the-counter products.  He states he tried all of them and did not notice any difference.  He also does not like taking pilocarpine due to excessive salivation.  I discussed reducing the dose of pilocarpine and he stated that he has tried that as well.  He is very frustrated and would like to be referred to a tertiary care center.- Plan: Ambulatory referral to Rheumatology  Elevated CK -his CK was elevated at 532.  Aldolase was normal.  Myositis panel is pending at this point.   He was taken off the statins by his PCP.  Plan repeat CK at the follow-up visit.  Elevated LFTs-LFTs were also elevated.  Patient states that he has discussed that with his PCP.  He will have repeat labs with his PCP.  Primary osteoarthritis of both hands - Clinical findings are consistent with osteoarthritis.  Joint protection muscle strengthening was discussed.  Trigger finger, left middle finger - He had injections in the past and plans to have surgery in the future.  Trigger finger, left ring finger-he had injections in the past.  He does not want to have surgery.  Pain in left hip - X-rays of the left hip joint were unremarkable.  X-ray findings were discussed with the patient.  He continues to have discomfort in his left hip.  Chronic pain of both knees - Both knee joints were in good range of motion.  No warmth swelling or effusion was noted.  Primary osteoarthritis of both feet - History of left foot fracture in the past.  Clinical findings were consistent with osteoarthritis.  Proper fitting shoes were advised.  DDD (degenerative disc disease), lumbar - History of chronic lower back pain.  X-rays showed degenerative changes, spondylolisthesis and facet joint arthropathy.  He may benefit from physical therapy.  Chronic pain syndrome - History of chronic pain for many years.  Other fatigue-he relates to generalized pain and discomfort.  Abnormal SPEP-IFE showed poorly defined area of restricted protein mobility reactive with lambda light chain antisera, hematology referral is pending.  History of hepatitis C  History of gastroesophageal reflux (GERD)  Recovering alcoholic in remission (HCC) - Quit drinking in 2020.  Essential hypertension  Prediabetes  History of colonic polyps  Anxiety and depression  Vitamin D deficiency  Orders: Orders Placed This Encounter  Procedures   Ambulatory referral to Rheumatology   No orders of the defined types were placed in this  encounter.  .  Follow-Up Instructions: Return in about 6 months (around 09/05/2022), or if symptoms worsen or fail to improve, for Osteoarthritis, sicca symptoms.    , MD  Note - This record has been created using Dragon software.  Chart creation errors have been sought, but may not always  have been located. Such creation errors do not reflect on  the standard of medical care. 

## 2022-03-02 ENCOUNTER — Ambulatory Visit: Payer: Medicare HMO | Admitting: Rheumatology

## 2022-03-02 ENCOUNTER — Other Ambulatory Visit: Payer: Self-pay | Admitting: *Deleted

## 2022-03-02 DIAGNOSIS — F1021 Alcohol dependence, in remission: Secondary | ICD-10-CM

## 2022-03-02 DIAGNOSIS — M19071 Primary osteoarthritis, right ankle and foot: Secondary | ICD-10-CM

## 2022-03-02 DIAGNOSIS — M791 Myalgia, unspecified site: Secondary | ICD-10-CM

## 2022-03-02 DIAGNOSIS — Z86010 Personal history of colonic polyps: Secondary | ICD-10-CM

## 2022-03-02 DIAGNOSIS — R778 Other specified abnormalities of plasma proteins: Secondary | ICD-10-CM

## 2022-03-02 DIAGNOSIS — G894 Chronic pain syndrome: Secondary | ICD-10-CM

## 2022-03-02 DIAGNOSIS — R768 Other specified abnormal immunological findings in serum: Secondary | ICD-10-CM | POA: Diagnosis not present

## 2022-03-02 DIAGNOSIS — M65332 Trigger finger, left middle finger: Secondary | ICD-10-CM

## 2022-03-02 DIAGNOSIS — R5383 Other fatigue: Secondary | ICD-10-CM

## 2022-03-02 DIAGNOSIS — G8929 Other chronic pain: Secondary | ICD-10-CM

## 2022-03-02 DIAGNOSIS — R682 Dry mouth, unspecified: Secondary | ICD-10-CM

## 2022-03-02 DIAGNOSIS — R748 Abnormal levels of other serum enzymes: Secondary | ICD-10-CM

## 2022-03-02 DIAGNOSIS — R7303 Prediabetes: Secondary | ICD-10-CM

## 2022-03-02 DIAGNOSIS — F419 Anxiety disorder, unspecified: Secondary | ICD-10-CM

## 2022-03-02 DIAGNOSIS — I10 Essential (primary) hypertension: Secondary | ICD-10-CM

## 2022-03-02 DIAGNOSIS — E559 Vitamin D deficiency, unspecified: Secondary | ICD-10-CM

## 2022-03-02 DIAGNOSIS — M25552 Pain in left hip: Secondary | ICD-10-CM

## 2022-03-02 DIAGNOSIS — M65342 Trigger finger, left ring finger: Secondary | ICD-10-CM

## 2022-03-02 DIAGNOSIS — Z8619 Personal history of other infectious and parasitic diseases: Secondary | ICD-10-CM

## 2022-03-02 DIAGNOSIS — Z8719 Personal history of other diseases of the digestive system: Secondary | ICD-10-CM

## 2022-03-05 ENCOUNTER — Ambulatory Visit: Payer: Medicare HMO | Admitting: Rheumatology

## 2022-03-05 ENCOUNTER — Encounter: Payer: Self-pay | Admitting: Rheumatology

## 2022-03-05 VITALS — BP 122/67 | HR 51 | Ht 68.0 in | Wt 138.2 lb

## 2022-03-05 DIAGNOSIS — M25561 Pain in right knee: Secondary | ICD-10-CM | POA: Diagnosis not present

## 2022-03-05 DIAGNOSIS — R682 Dry mouth, unspecified: Secondary | ICD-10-CM | POA: Diagnosis not present

## 2022-03-05 DIAGNOSIS — H04123 Dry eye syndrome of bilateral lacrimal glands: Secondary | ICD-10-CM

## 2022-03-05 DIAGNOSIS — F419 Anxiety disorder, unspecified: Secondary | ICD-10-CM

## 2022-03-05 DIAGNOSIS — M19071 Primary osteoarthritis, right ankle and foot: Secondary | ICD-10-CM

## 2022-03-05 DIAGNOSIS — Z86010 Personal history of colonic polyps: Secondary | ICD-10-CM

## 2022-03-05 DIAGNOSIS — R768 Other specified abnormal immunological findings in serum: Secondary | ICD-10-CM | POA: Diagnosis not present

## 2022-03-05 DIAGNOSIS — R7989 Other specified abnormal findings of blood chemistry: Secondary | ICD-10-CM | POA: Diagnosis not present

## 2022-03-05 DIAGNOSIS — M19042 Primary osteoarthritis, left hand: Secondary | ICD-10-CM

## 2022-03-05 DIAGNOSIS — R5383 Other fatigue: Secondary | ICD-10-CM

## 2022-03-05 DIAGNOSIS — R748 Abnormal levels of other serum enzymes: Secondary | ICD-10-CM

## 2022-03-05 DIAGNOSIS — M25552 Pain in left hip: Secondary | ICD-10-CM

## 2022-03-05 DIAGNOSIS — M19072 Primary osteoarthritis, left ankle and foot: Secondary | ICD-10-CM

## 2022-03-05 DIAGNOSIS — M65332 Trigger finger, left middle finger: Secondary | ICD-10-CM | POA: Diagnosis not present

## 2022-03-05 DIAGNOSIS — G8929 Other chronic pain: Secondary | ICD-10-CM

## 2022-03-05 DIAGNOSIS — F32A Depression, unspecified: Secondary | ICD-10-CM

## 2022-03-05 DIAGNOSIS — F1021 Alcohol dependence, in remission: Secondary | ICD-10-CM | POA: Diagnosis not present

## 2022-03-05 DIAGNOSIS — Z8619 Personal history of other infectious and parasitic diseases: Secondary | ICD-10-CM

## 2022-03-05 DIAGNOSIS — M19041 Primary osteoarthritis, right hand: Secondary | ICD-10-CM

## 2022-03-05 DIAGNOSIS — R778 Other specified abnormalities of plasma proteins: Secondary | ICD-10-CM

## 2022-03-05 DIAGNOSIS — M25562 Pain in left knee: Secondary | ICD-10-CM

## 2022-03-05 DIAGNOSIS — R7303 Prediabetes: Secondary | ICD-10-CM

## 2022-03-05 DIAGNOSIS — G894 Chronic pain syndrome: Secondary | ICD-10-CM

## 2022-03-05 DIAGNOSIS — M65342 Trigger finger, left ring finger: Secondary | ICD-10-CM

## 2022-03-05 DIAGNOSIS — M5136 Other intervertebral disc degeneration, lumbar region: Secondary | ICD-10-CM

## 2022-03-05 DIAGNOSIS — E559 Vitamin D deficiency, unspecified: Secondary | ICD-10-CM

## 2022-03-05 DIAGNOSIS — I10 Essential (primary) hypertension: Secondary | ICD-10-CM

## 2022-03-05 DIAGNOSIS — Z8719 Personal history of other diseases of the digestive system: Secondary | ICD-10-CM

## 2022-03-08 LAB — MYOSITIS SPECIFIC II ANTIBODIES PANEL
EJ AB: <11 SI (ref <11)
JO-1 AB: <11 SI (ref <11)
MDA-5 AB: <11 SI (ref <11)
MI-2 ALPHA AB: <11 SI (ref <11)
MI-2 BETA AB: <11 SI (ref <11)
NXP-2 AB: <11 SI (ref <11)
OJ AB: <11 SI (ref <11)
PL-12 AB: <11 SI (ref <11)
PL-7 AB: <11 SI (ref <11)
SRP-AB: <11 SI (ref <11)
TIF-1y AB: <11 SI (ref <11)

## 2022-03-08 LAB — ALDOLASE: Aldolase: 4.5 U/L (ref <=8.1)

## 2022-03-08 LAB — HMGCR AB (IGG): HMGCR AB (IGG): <2 CU (ref <20)

## 2022-03-09 ENCOUNTER — Ambulatory Visit (INDEPENDENT_AMBULATORY_CARE_PROVIDER_SITE_OTHER): Payer: Medicare HMO

## 2022-03-09 DIAGNOSIS — Z0000 Encounter for general adult medical examination without abnormal findings: Secondary | ICD-10-CM | POA: Diagnosis not present

## 2022-03-09 NOTE — Progress Notes (Signed)
Subjective:   Marc [name] is a 66 y.o. male who presents for Medicare Annual/Subsequent preventive examination. I connected with  Marc [name] on 03/09/22 by a audio enabled telemedicine application and verified that I am speaking with the correct person using two identifiers.  Patient Location: Home  Provider Location: Office/Clinic  I discussed the limitations of evaluation and management by telemedicine. The patient expressed understanding and agreed to proceed.   Review of Systems     Marc [name] , Thank you for taking time to come for your Medicare Wellness Visit. I appreciate your ongoing commitment to your health goals. Please review the following plan we discussed and let me know if I can assist you in the future.   These are the goals we discussed:  Goals      DIET - EAT MORE FRUITS AND VEGETABLES     DIET - REDUCE SUGAR INTAKE     LDL CALC < 130        This is a list of the screening recommended for you and due dates:  Health Maintenance  Topic Date Due   Zoster (Shingles) Vaccine (1 of 2) Never done   COVID-19 Vaccine (3 - Moderna risk series) 07/25/2020   Tetanus Vaccine  12/23/2021   Flu Shot  03/10/2022   Colon Cancer Screening  12/04/2029   Pneumonia Vaccine  Completed   Hepatitis C Screening: USPSTF Recommendation to screen - Ages 18-79 yo.  Completed   HPV Vaccine  Aged Out          Objective:    There were no vitals filed for this visit. There is no height or weight on file to calculate BMI.     03/07/2021   10:42 AM 09/19/2020    3:03 PM 05/28/2020   12:25 PM 05/21/2020   11:23 AM 03/04/2020    3:59 PM 12/01/2019   11:52 AM 02/22/2019    2:37 PM  Advanced Directives  Does Patient Have a Medical Advance Directive? No No No No No No No  Would patient like information on creating a medical advance directive? No - Patient declined  No - Patient declined No - Patient declined Yes (ED - Information included in AVS)  Yes (MAU/Ambulatory/Procedural  Areas - Information given)    Current Medications (verified) Outpatient Encounter Medications as of 03/09/2022  Medication Sig   AMBULATORY NON FORMULARY MEDICATION Kratom Take 2 tablet by mouth twice daily   AMBULATORY NON FORMULARY MEDICATION Friska caffiene 1-2 tablet twice daily   amLODipine (NORVASC) 2.5 MG tablet Take 1 tablet (2.5 mg total) by mouth daily.   Ascorbic Acid (VITAMIN C WITH ROSE HIPS) 500 MG tablet Take 500 mg by mouth daily.    aspirin EC 81 MG tablet Take 81 mg by mouth daily.   baclofen (LIORESAL) 10 MG tablet TAKE (1/2) TABLET ('5MG'$ ) BY MOUTH TWICE DAILY BEFORE A MEAL.   famotidine (PEPCID) 20 MG tablet TAKE 1 TABLET BY MOUTH TWICE DAILY. TAKE WITH BREAKFAST AND AT BEDTIME.   ibuprofen (ADVIL) 200 MG tablet Take 400 mg by mouth 2 (two) times daily.   lansoprazole (PREVACID) 30 MG capsule Take 1 capsule (30 mg total) by mouth 2 (two) times daily before a meal.   lubiprostone (AMITIZA) 24 MCG capsule TAKE 1 CAPSULE TWICE DAILY WITH MEALS.   Multiple Vitamin (MULTIVITAMIN WITH MINERALS) TABS tablet Take 1 tablet by mouth daily.   Nutritional Supplements (KETO PO) Take 1 tablet by mouth in the morning, at noon,  and at bedtime.    pilocarpine (SALAGEN) 5 MG tablet Take 5 mg by mouth in the morning, at noon, in the evening, and at bedtime.   Probiotic Product (PROBIOTIC DAILY PO) Take by mouth daily.   RABEprazole (ACIPHEX) 20 MG tablet Take 20 mg by mouth daily.   senna (SENOKOT) 8.6 MG tablet Take 2 tablets by mouth 2 (two) times daily.   Turmeric 400 MG CAPS Take 800 mg by mouth daily.    Vitamin D3 (VITAMIN D) 25 MCG tablet Take 4,000 Units by mouth daily.    No facility-administered encounter medications on file as of 03/09/2022.    Allergies (verified) Levofloxacin, Ciprofloxacin, and Sulfa antibiotics   History: Past Medical History:  Diagnosis Date   Acute encephalopathy 12/29/2017   Hospitalized 5/22 to 5/23 with acute encephalopathy, unclear etiology    Acute gout involving toe of right foot 09/14/2019   Alcoholism ([place])    Allergy    Anxiety    Arthritis    Back problem    Chest wall pain 03/28/2017   Chronic hepatitis C without hepatic coma ([place]) 01/04/2009   Qualifier: Diagnosis of  By: Westly Pam. 2008: CT A/P NO CIRRHOSIS AUG 2013 MRCP- DILATED CBD/NO CIRRHOSIS DEC 2013 AST 99-102  ALT 145-156 PLT 147 HB 13.4 ALB 4.4-4.7 T BIL 0.7    Depression    Dilation of biliary tract 07/14/2012   EUS JAN 2014 BENIGN CBD DILATION    Epigastric pain 07/14/2012   GERD (gastroesophageal reflux disease)    Gout    HCV (hepatitis C virus) 06/16/2015   Hepatitis B    Hepatitis C 1979   Hepatitis C reactive    LIVER BX 2008-CHRONIC ACTIVE HEPATITIS   HTN (hypertension)    Hx of adenomatous colonic polyps 2008   due for surveillance 2017   Hypercholesterolemia    IBS (irritable bowel syndrome)    Irritable bowel syndrome 01/04/2009   Qualifier: Diagnosis of  By: Westly Pam.    Knee pain    Left forearm pain 08/15/2017   Leukopenia 94/85/4627   Lichen planus    Narcotic addiction ([place]) 06/10/2011   [facility]- Oct 2012    Normal cardiac stress test 05/25/2011   [facility] [place]   Normal echocardiogram 05/25/2011   EF 55%, mild TR   Oral lichen planus    Plaque psoriasis    RUQ pain 07/14/2012   Status post dilation of esophageal narrowing    Substance abuse ([place])    narcotic addiction   Thrombocytopenia ([place]) 11/06/2015   Trigger middle finger of left hand 06/20/2012   Past Surgical History:  Procedure Laterality Date   24 HOUR [place] STUDY  10/21/2020   Procedure: 24 HOUR [place] STUDY;  Surgeon: Mauri Pole, MD;  Location: [facility];  Service: Endoscopy;;   APPENDECTOMY     BALLOON DILATION N/A 05/28/2020   Procedure: [place];  Surgeon: Eloise Harman, DO;  [facility];  Service: Endoscopy;  Laterality: N/A;   BIOPSY  05/28/2020   Procedure: BIOPSY;   Surgeon: Eloise Harman, DO;  [facility];  Service: Endoscopy;;   CARPAL TUNNEL RELEASE     rt   COLONOSCOPY  2008 [facility] ARS D100 V8 PHEN 12.5   2 SIMPLE ADENOMAS (< 1 CM)   COLONOSCOPY WITH PROPOFOL N/A 12/05/2019   TI normal, 3 mm polyp in ascending colon, external and internal hemorrhoids. Polyp removed but  not retrieved. Poor prep   ESOPHAGEAL MANOMETRY N/A 10/21/2020   Procedure: ESOPHAGEAL MANOMETRY (EM);  Surgeon: Mauri Pole, MD;  [facility];  Service: Endoscopy;  Laterality: N/A;   ESOPHAGOGASTRODUODENOSCOPY  04/26/2012   Dr. Oneida Alar: Non-erosive gastritis (inflammation) was found in the gastric antrum but no H.pylori; multiple biopsies (duodenal bx negative for Celiac)/ The mucosa of the esophagus appeared normal   ESOPHAGOGASTRODUODENOSCOPY (EGD) WITH PROPOFOL N/A 12/05/2019   mild gastritis, duodenitis due to aspirin/NSAIDs.    ESOPHAGOGASTRODUODENOSCOPY (EGD) WITH PROPOFOL N/A 05/28/2020   Procedure: ESOPHAGOGASTRODUODENOSCOPY (EGD) WITH PROPOFOL;  Surgeon: Eloise Harman, DO;  [facility];  Service: Endoscopy;  Laterality: N/A;  2:30pm   EUS  09/08/2012   Dr. Ardis Hughs: CBD dilated but no stones. Query secondary to Sphincter of Oddi stenosis, ?dysfunction, but clinically without symptoms   FRACTURE SURGERY N/A    [employer] 08/25/2020   HARDWARE REMOVAL Right 02/12/2014   Procedure: HARDWARE REMOVAL;  Surgeon: Jolyn Nap, MD;  Location: [place];  Service: Orthopedics;  Laterality: Right;   KNEE ARTHROSCOPY     Neurostimulator implant     POLYPECTOMY  12/05/2019   Procedure: POLYPECTOMY;  Surgeon: Danie Binder, MD;  [facility];  Service: Endoscopy;;   right ring finger     spinal stenosis, had screws put in neck  04/11/2009   DR KRITZER   TONSILLECTOMY     ULNA OSTEOTOMY Right 02/12/2014   Procedure: RIGHT ULNAR SHORTENING AND OSTEOTOMY ;  Surgeon: Jolyn Nap, MD;  Location: [facility];  Service: Orthopedics;  Laterality: Right;   ULNAR NERVE TRANSPOSITION     UPPER GASTROINTESTINAL ENDOSCOPY  2008 SLF ABD PAIN WEIGHT LOSS d100 v8 phen 12.5   NL   WRIST SURGERY Right jan 2015   Dr. Edmonia Lynch   Family History  Problem Relation Age of Onset   Bladder Cancer Mother        rare form    Cancer Sister        Metastatic abdominal   Emotional abuse Sister    Stroke Sister    Emotional abuse Sister    Heart failure Father    Stroke Father    Alcohol abuse Father    Dementia Paternal Aunt    Alcohol abuse Paternal Uncle    Dementia Paternal Uncle    Dementia Paternal Grandmother    Stroke Paternal Grandmother    ADD / ADHD Cousin    Bipolar disorder Cousin    Anxiety disorder Cousin    Depression Cousin        Committed suicide   Alcohol abuse Cousin    OCD Cousin    Paranoid behavior Cousin    Brain cancer Maternal Grandfather    Heart defect Other        FAMILY HX   Colon cancer Maternal Uncle    Colon cancer Cousin    Colon polyps Neg Hx    Drug abuse Neg Hx    Schizophrenia Neg Hx    Seizures Neg Hx    Sexual abuse Neg Hx    Physical abuse Neg Hx    Social History   Socioeconomic History   Marital status: Divorced    Spouse name: Not on file   Number of children: 1   Years of education: Not on file   Highest education level: Not on file  Occupational History   Occupation: Disabled/retired  Tobacco Use   Smoking status:  Former    Packs/day: 1.50    Years: 8.00    Total pack years: 12.00    Types: Cigarettes    Quit date: 08/13/1986    Years since quitting: 35.5    Passive exposure: Current   Smokeless tobacco: Never   Tobacco comments:    quit 20 + yrs ago  Vaping Use   Vaping Use: Never used  Substance and Sexual Activity   Alcohol use: Not Currently    Alcohol/week: 0.0 standard drinks of alcohol    Comment: Used to drink heavily- went to treatment in May 2020.    Drug use: Not Currently    Comment: pain medications,  klonopin- none in the last 17 months. Currently in [place]   Sexual activity: Never  Other Topics Concern   Not on file  Social History Narrative   Not on file   Social Determinants of Health   Financial Resource Strain: Low Risk  (03/07/2021)   Overall Financial Resource Strain (CARDIA)    Difficulty of Paying Living Expenses: Not hard at all  Food Insecurity: No Food Insecurity (03/07/2021)   Hunger Vital Sign    Worried About Running Out of Food in the Last Year: Never true    [place] in the Last Year: Never true  Transportation Needs: No Transportation Needs (03/07/2021)   PRAPARE - Hydrologist (Medical): No    Lack of Transportation (Non-Medical): No  Physical Activity: Sufficiently Active (03/07/2021)   Exercise Vital Sign    Days of Exercise per Week: 5 days    Minutes of Exercise per Session: 30 min  Stress: No Stress Concern Present (03/07/2021)   [place]    Feeling of Stress : Not at all  Social Connections: Socially Isolated (03/07/2021)   Social Connection and Isolation Panel [NHANES]    Frequency of Communication with Friends and Family: Three times a week    Frequency of Social Gatherings with Friends and Family: More than three times a week    Attends Religious Services: Never    Marine scientist or Organizations: No    Attends Archivist Meetings: Never    Marital Status: Divorced    Tobacco Counseling Counseling given: Not Answered Tobacco comments: quit 20 + yrs ago   Clinical Intake:              How often do you need to have someone help you when you read instructions, pamphlets, or other written materials from your doctor or pharmacy?: (P) 1 - Never  Diabetic? No         Activities of Daily Living    03/08/2022    7:25 AM  In your present state of health, do you have any difficulty performing the following activities:   Hearing? 0  Vision? 0  Difficulty concentrating or making decisions? 0  Walking or climbing stairs? 0  Dressing or bathing? 0  Doing errands, shopping? 0  Preparing Food and eating ? N  Using the Toilet? N  In the past six months, have you accidently leaked urine? N  Do you have problems with loss of bowel control? N  Managing your Medications? N  Managing your Finances? N  Housekeeping or managing your Housekeeping? N    Patient Care Team: Fayrene Helper, MD as PCP - General (Family Medicine) Leta Baptist, MD as Consulting Physician (Otolaryngology) Drema Dallas, MD (Physical Medicine and  Rehabilitation) Milly Jakob, MD as Consulting Physician (Orthopedic Surgery) Cloria Spring, MD as Consulting Physician ([place]) Eloise Harman, DO as Consulting Physician (Internal Medicine)  Indicate any recent Medical Services you may have received from other than [facility] providers in the past year (date may be approximate).     Assessment:   This is a routine wellness examination for Shawn.  Hearing/Vision screen No results found.  Dietary issues and exercise activities discussed:     Goals Addressed   None   Depression Screen    02/19/2022    9:12 AM 01/07/2022    2:32 PM 12/18/2021    8:59 AM 10/10/2021    1:15 PM 10/02/2021   11:08 AM 09/30/2021    8:28 AM 06/20/2021    8:11 AM  PHQ 2/9 Scores  PHQ - 2 Score 3 0 '1 4  1 '$ 0  PHQ- 9 Score '8   8  10 '$ 0     Information is confidential and restricted. Go to Review Flowsheets to unlock data.     Fall Risk    03/08/2022    7:25 AM 02/19/2022    9:11 AM 01/07/2022    2:32 PM 12/18/2021    8:59 AM 10/10/2021    1:15 PM  [place] in the past year? 0 1 0 0 0  Number falls in past yr: 0 0 0 0 0  Injury with Fall? 0 1 0 0 0  Risk for fall due to :  Other (Comment) No Fall Risks  No Fall Risks  Follow up   Falls evaluation completed  Falls evaluation completed    [place]:  Any stairs in or around the home? Yes  If so, are there any without handrails? No  Home free of loose throw rugs in walkways, pet beds, electrical cords, etc? Yes  Adequate lighting in your home to reduce risk of falls? Yes   ASSISTIVE DEVICES UTILIZED TO PREVENT FALLS:  Life alert? No  Use of a cane, walker or w/c? No  Grab bars in the bathroom? No  Shower chair or bench in shower? No  Elevated toilet seat or a handicapped toilet? No    Cognitive Function:        03/07/2021   10:47 AM 03/04/2020    3:50 PM 02/08/2019    1:50 PM 05/05/2017    2:09 PM  6CIT Screen  What Year? 0 points 0 points 0 points 0 points  What month? 0 points 0 points 0 points 0 points  What time? 0 points 0 points 0 points 0 points  Count back from 20 0 points 0 points 0 points 0 points  Months in reverse 0 points 0 points 0 points 0 points  Repeat phrase 0 points 2 points 0 points 0 points  Total Score 0 points 2 points 0 points 0 points    Immunizations Immunization History  Administered Date(s) Administered   Fluad Quad(high Dose 65+) 04/11/2019, 06/04/2020, 04/22/2021   Influenza Split 06/20/2012   Influenza,inj,Quad PF,6+ Mos 04/24/2013, 08/28/2014, 06/13/2015, 08/11/2017   Influenza-Unspecified 04/24/2013, 08/28/2014, 06/13/2015, 08/11/2017   Moderna SARS-COV2 Booster Vaccination 06/27/2020   Moderna Sars-Covid-2 Vaccination 12/05/2019, 01/02/2020, 06/27/2020   PNEUMOCOCCAL CONJUGATE-20 04/22/2021   Pneumococcal Conjugate-13 02/28/2019   Tdap 12/24/2011   Zoster, Live 10/04/2013    TDAP status: Due, Education has been provided regarding the importance of this vaccine. Advised may receive this vaccine at local pharmacy or Health  Dept. Aware to provide a copy of the vaccination record if obtained from local pharmacy or Health Dept. Verbalized acceptance and understanding.  Flu Vaccine status: Up to date  Pneumococcal vaccine status: Up to date  Covid-19 vaccine status:  Completed vaccines  Qualifies for Shingles Vaccine? Yes   Zostavax completed No   Shingrix Completed?: No.    Education has been provided regarding the importance of this vaccine. Patient has been advised to call insurance company to determine out of pocket expense if they have not yet received this vaccine. Advised may also receive vaccine at local pharmacy or Health Dept. Verbalized acceptance and understanding.  Screening Tests Health Maintenance  Topic Date Due   Zoster Vaccines- Shingrix (1 of 2) Never done   COVID-19 Vaccine (3 - Moderna risk series) 07/25/2020   TETANUS/TDAP  12/23/2021   INFLUENZA VACCINE  03/10/2022   COLONOSCOPY (Pts 45-36yr Insurance coverage will need to be confirmed)  12/04/2029   Pneumonia Vaccine 66 Years old  Completed   Hepatitis C Screening  Completed   HPV VACCINES  Aged Out    Health Maintenance  Health Maintenance Due  Topic Date Due   Zoster Vaccines- Shingrix (1 of 2) Never done   COVID-19 Vaccine (3 - Moderna risk series) 07/25/2020   TETANUS/TDAP  12/23/2021    Colorectal cancer screening: Type of screening: Colonoscopy. Completed 12/05/2019. Repeat every 10 years  Lung Cancer Screening: (Low Dose CT Chest recommended if Age 68-80years, 30 pack-year currently smoking OR have quit w/in 15years.) does not qualify.    Additional Screening:  Hepatitis C Screening: does qualify; Completed 03/05/2022  Vision Screening: Recommended annual ophthalmology exams for early detection of glaucoma and other disorders of the eye. Is the patient up to date with their annual eye exam?  No   If pt is not established with a provider, would they like to be referred to a provider to establish care? No .   Dental Screening: Recommended annual dental exams for proper oral hygiene  Community Resource Referral / Chronic Care Management: CRR required this visit?  No   CCM required this visit?  No      Plan:     I have personally reviewed and  noted the following in the patient's chart:   Medical and social history Use of alcohol, tobacco or illicit drugs  Current medications and supplements including opioid prescriptions. Patient is not currently taking opioid prescriptions. Functional ability and status Nutritional status Physical activity Advanced directives List of other physicians Hospitalizations, surgeries, and ER visits in previous 12 months Vitals Screenings to include cognitive, depression, and falls Referrals and appointments  In addition, I have reviewed and discussed with patient certain preventive protocols, quality metrics, and best practice recommendations. A written personalized care plan for preventive services as well as general preventive health recommendations were provided to patient.     KJohny Drilling CGeronimo  03/09/2022   Nurse Notes:  Mr. PHairfield, Thank you for taking time to come for your Medicare Wellness Visit. I appreciate your ongoing commitment to your health goals. Please review the following plan we discussed and let me know if I can assist you in the future.   These are the goals we discussed:  Goals      DIET - EAT MORE FRUITS AND VEGETABLES     DIET - REDUCE SUGAR INTAKE     LDL CALC < 130        This is a list of the  screening recommended for you and due dates:  Health Maintenance  Topic Date Due   Zoster (Shingles) Vaccine (1 of 2) Never done   COVID-19 Vaccine (3 - Moderna risk series) 07/25/2020   Tetanus Vaccine  12/23/2021   Flu Shot  03/10/2022   Colon Cancer Screening  12/04/2029   Pneumonia Vaccine  Completed   Hepatitis C Screening: USPSTF Recommendation to screen - Ages 18-79 yo.  Completed   HPV Vaccine  Aged Out

## 2022-03-11 ENCOUNTER — Other Ambulatory Visit: Payer: Self-pay | Admitting: Internal Medicine

## 2022-03-11 DIAGNOSIS — R778 Other specified abnormalities of plasma proteins: Secondary | ICD-10-CM | POA: Insufficient documentation

## 2022-03-11 NOTE — Progress Notes (Signed)
Primary Care Physician:  Fayrene Helper, MD  Primary GI: Dr. Abbey Chatters  Patient Location: Home   Provider Location: [facility] office   Reason for Visit: Follow-up   Persons present on the virtual encounter, with roles: Aliene Altes, PA-C (Provider), Marc [name] (patient)   Total time (minutes) spent on medical discussion: 28 minutes  Virtual Visit via video Note Due to COVID-19, visit is conducted virtually and was requested by patient.   I connected with with Marc [name] on 03/13/22 at 10:00 AM EDT by video and verified that I am speaking with the correct person using two identifiers.   I discussed the limitations, risks, security and privacy concerns of performing an evaluation and management service by video and the availability of in person appointments. I also discussed with the patient that there may be a patient responsible charge related to this service. The patient expressed understanding and agreed to proceed.  Chief Complaint  Patient presents with   Medication Refill     History of Present Illness: 66 y.o. male with history of GERD, dysphagia, constipation, colon polyps, hemorrhoids, and hep C genotype 1a, elastography F3/F4 s/p treatment in the past with [facility] and documented SVR. Post treatment elastography with kPa 6.2 in February 2021.  He is presenting today for follow-up.   Last colonoscopy in April 2021 with poor prep, polyp removed but not retrieved.  Recommended repeat colonoscopy in 3 months.  This has yet to be completed.  EGD in October 2021 for dysphagia with benign-appearing esophageal stenosis s/p dilation, gastritis s/p biopsy, normal examined duodenum.  Pathology negative H. Pylori.  He continued with dysphagia despite dilation and underwent extensive evaluation with  esophagram, modified barium, esophageal manometry/pH testing, ENT work-up including laryngoscopy that have been largely unrevealing.  On pH testing, he did have increased  gastroesophageal weekly acid reflux with recommendations to consider TF or fundoplication to decrease nonacid reflux.  He has tried and failed multiple PPIs including omeprazole, pantoprazole, Nexium, Aciphex, Prevacid, and Dexilant.  Last seen in our office 01/01/2021.  His primary concern was ongoing dysphagia and GERD.  He was on Prevacid 30 mg twice daily, famotidine, and had also been started on baclofen after prior visit and reported only slight improvement, but having trouble with side effects including fatigue.  Constipation was well controlled on Amitiza 24 mcg twice daily.  Dr. Abbey Chatters suspected psychiatric component to patient's symptoms to some degree as he does have underlying anxiety and depression though patient does not believe this is the case.  He was referred to Dr. Silverio Decamp to discuss refractory dysphagia.  Unfortunately, Dr. Silverio Decamp was unable to accommodate referral and he was ultimately referred to [facility].  He saw Dr. Truddie Crumble on 04/22/2021.  Lansoprazole was changed to Aciphex 20 mg.  Plan for barium esophagram and EGD with dilation and biopsies.  If persistent symptoms, would obtain high-resolution impedance manometry.  Queried whether patient may have component of esophageal lichen planus as he has a history of lichen planus.  EGD 07/28/2021 with normal exam s/p dilation, esophageal biopsies obtained and were benign.  Gastric emptying 09/02/2021 with normal exam.  Today:  Dysphagia/GERD:  Following with [facility].  Hasn't done manometry or pH.  States he plans not to do this if at all possible. Reflux seems to be doing ok with Pepcid 20 mg twice daily and Aciphex 20 mg in the morning.  Also still taking baclofen. Has some flares at night.  Continues with dysphagia to solid foods  and pills.  Occasionally has to cough items back up.  Due to severe dry mouth, he avoids meats.  He eats mostly vegan diet. Has follow-up in December with [facility].    Constipation: Well  controlled with Amitiza 24 mcg twice daily.. Also couple of senna daily. No brbpr or melena.    Intermittent pain in the RUQ. Started several months ago. Daily. Not triggered by meals. Feels like a cramp. Last several hours. Doesn't take anything. Tried taking gas relief, but no change. No nausea vomiting with it. Worse with sitting. Runs daily.  About 3 days a week, he will work out for several hours. No pain with activity.  Seems to be worse with talking as well.  If he takes his tongue and puts it at the roof of his mouth or the left side, it seems to help.    Recent hemoglobin on 03/12/2022 was slightly low at 12.7 with normocytic indices.  Ferritin was found to be low at 12, iron low at 41, saturation low at 10%.  Liver enzymes also mildly elevated with AST 45, ALT 46.  LFTs have been mildly elevated since March 2022 with brief normalization November 2022.  He has been following with rheumatology.  His ANA is positive with 1: 640 titer, cytoplasmic, rods and rings pattern.Immunoglobulins within normal limits. He has now been referred to [facility] rheumatology for sicca symptoms.   Patient states he is not ready to schedule a colonoscopy right now.  Alcohol: None in over 3 years. Illicit drug use None.  Tylenol:  Had been taking tylenol, 4 a day. Stopped a few weeks ago when she saw Dr. Moshe Cipro Medications: Has been taking atorvastatin, but stopped this a few weeks ago after seeing Dr. Moshe Cipro. OTC supplements/herbal teas: Takes Kratom, caffeine, keto pill for energy, multivitamin, turmeric.    Past Medical History:  Diagnosis Date   Acute encephalopathy 12/29/2017   Hospitalized 5/22 to 5/23 with acute encephalopathy, unclear etiology   Acute gout involving toe of right foot 09/14/2019   Alcoholism ([place])    Allergy    Anxiety    Arthritis    Back problem    Cataract 01/2021   Chest wall pain 03/28/2017   Chronic hepatitis C without hepatic coma ([place]) 01/04/2009   Qualifier: Diagnosis of   By: Westly Pam. 2008: CT A/P NO CIRRHOSIS AUG 2013 MRCP- DILATED CBD/NO CIRRHOSIS DEC 2013 AST 99-102  ALT 145-156 PLT 147 HB 13.4 ALB 4.4-4.7 T BIL 0.7    Depression    Dilation of biliary tract 07/14/2012   EUS JAN 2014 BENIGN CBD DILATION    Epigastric pain 07/14/2012   GERD (gastroesophageal reflux disease)    Gout    HCV (hepatitis C virus) 06/16/2015   Hepatitis B    Hepatitis C 1979   Hepatitis C reactive    LIVER BX 2008-CHRONIC ACTIVE HEPATITIS   HTN (hypertension)    Hx of adenomatous colonic polyps 2008   due for surveillance 2017   Hypercholesterolemia    IBS (irritable bowel syndrome)    Iron deficiency anemia 03/13/2022   Irritable bowel syndrome 01/04/2009   Qualifier: Diagnosis of  By: Westly Pam.    Knee pain    Left forearm pain 08/15/2017   Leukopenia 57/32/2025   Lichen planus    Narcotic addiction ([place]) 06/10/2011   [facility]- Oct 2012    Normal cardiac stress test 05/25/2011   [place]  Normal echocardiogram 05/25/2011   EF 55%, mild TR   Oral lichen planus    Plaque psoriasis    RUQ pain 07/14/2012   Status post dilation of esophageal narrowing    Substance abuse ([place])    narcotic addiction   Thrombocytopenia ([place]) 11/06/2015   Trigger middle finger of left hand 06/20/2012     Past Surgical History:  Procedure Laterality Date   24 HOUR [place] STUDY  10/21/2020   Procedure: [place] STUDY;  Surgeon: Mauri Pole, MD;  Location: [facility];  Service: Endoscopy;;   APPENDECTOMY     BALLOON DILATION N/A 05/28/2020   Procedure: [place];  Surgeon: Eloise Harman, DO;  Location: [facility];  Service: Endoscopy;  Laterality: N/A;   BIOPSY  05/28/2020   Procedure: BIOPSY;  Surgeon: Eloise Harman, DO;  [facility];  Service: Endoscopy;;   CARPAL TUNNEL RELEASE     rt   COLONOSCOPY  2008 SLF ARS D100 V8 PHEN 12.5   2 SIMPLE ADENOMAS (< 1 CM)   COLONOSCOPY  WITH PROPOFOL N/A 12/05/2019   TI normal, 3 mm polyp in ascending colon, external and internal hemorrhoids. Polyp removed but not retrieved. Poor prep   ESOPHAGEAL MANOMETRY N/A 10/21/2020   Procedure: ESOPHAGEAL MANOMETRY (EM);  Surgeon: Mauri Pole, MD;  Location: [facility];  Service: Endoscopy;  Laterality: N/A;   ESOPHAGOGASTRODUODENOSCOPY  04/26/2012   Dr. Oneida Alar: Non-erosive gastritis (inflammation) was found in the gastric antrum but no H.pylori; multiple biopsies (duodenal bx negative for Celiac)/ The mucosa of the esophagus appeared normal   ESOPHAGOGASTRODUODENOSCOPY  07/28/2021   Reports that his health; normal exam s/p esophageal dilation, esophageal biopsies obtained and were benign.   ESOPHAGOGASTRODUODENOSCOPY (EGD) WITH PROPOFOL N/A 12/05/2019   mild gastritis, duodenitis due to aspirin/NSAIDs.    ESOPHAGOGASTRODUODENOSCOPY (EGD) WITH PROPOFOL N/A 05/28/2020   Procedure: ESOPHAGOGASTRODUODENOSCOPY (EGD) WITH PROPOFOL;  Surgeon: Eloise Harman, DO;  [facility];  Service: Endoscopy;  Laterality: N/A;  2:30pm   EUS  09/08/2012   Dr. Ardis Hughs: CBD dilated but no stones. Query secondary to Sphincter of Oddi stenosis, ?dysfunction, but clinically without symptoms   FRACTURE SURGERY N/A    [employer] 08/25/2020   HARDWARE REMOVAL Right 02/12/2014   Procedure: HARDWARE REMOVAL;  Surgeon: Jolyn Nap, MD;  Location: [place];  Service: Orthopedics;  Laterality: Right;   KNEE ARTHROSCOPY     Neurostimulator implant     POLYPECTOMY  12/05/2019   Procedure: POLYPECTOMY;  Surgeon: Danie Binder, MD;  [facility];  Service: Endoscopy;;   right ring finger     spinal stenosis, had screws put in neck  04/11/2009   [facility] [place] Right 02/12/2014   Procedure: RIGHT ULNAR SHORTENING AND OSTEOTOMY ;  Surgeon: Jolyn Nap, MD;  Location: [place];  Service:  Orthopedics;  Laterality: Right;   ULNAR NERVE TRANSPOSITION     UPPER GASTROINTESTINAL ENDOSCOPY  2008 SLF ABD PAIN WEIGHT LOSS d100 v8 phen 12.5   NL   WRIST SURGERY Right 08/2013   Dr. Edmonia Lynch     Current Meds  Medication Sig   AMBULATORY NON FORMULARY MEDICATION Kratom Take 2 tablet by mouth twice daily   AMBULATORY NON FORMULARY MEDICATION Friska caffiene 1-2 tablet twice daily   amLODipine (NORVASC) 2.5 MG tablet Take 1 tablet (2.5 mg total) by  mouth daily.   Ascorbic Acid (VITAMIN C WITH ROSE HIPS) 500 MG tablet Take 500 mg by mouth daily.    aspirin EC 81 MG tablet Take 81 mg by mouth daily.   famotidine (PEPCID) 20 MG tablet TAKE 1 TABLET BY MOUTH TWICE DAILY. TAKE WITH BREAKFAST AND AT BEDTIME.   ibuprofen (ADVIL) 200 MG tablet Take 400 mg by mouth 2 (two) times daily.   lansoprazole (PREVACID) 30 MG capsule Take 1 capsule (30 mg total) by mouth 2 (two) times daily before a meal.   Multiple Vitamin (MULTIVITAMIN WITH MINERALS) TABS tablet Take 1 tablet by mouth daily.   Nutritional Supplements (KETO PO) Take 1 tablet by mouth in the morning, at noon, and at bedtime.    pilocarpine (SALAGEN) 5 MG tablet Take 5 mg by mouth in the morning, at noon, in the evening, and at bedtime.   Probiotic Product (PROBIOTIC DAILY PO) Take by mouth daily.   RABEprazole (ACIPHEX) 20 MG tablet Take 20 mg by mouth daily.   senna (SENOKOT) 8.6 MG tablet Take 2 tablets by mouth 2 (two) times daily.   Turmeric 400 MG CAPS Take 800 mg by mouth daily.    Vitamin D3 (VITAMIN D) 25 MCG tablet Take 4,000 Units by mouth daily.    [DISCONTINUED] baclofen (LIORESAL) 10 MG tablet TAKE (1/2) TABLET ('5MG'$ ) BY MOUTH TWICE DAILY BEFORE A MEAL.   [DISCONTINUED] lubiprostone (AMITIZA) 24 MCG capsule TAKE 1 CAPSULE TWICE DAILY WITH MEALS.     Family History  Problem Relation Age of Onset   Bladder Cancer Mother        rare form    Cancer Sister        Metastatic abdominal   Emotional abuse Sister     Stroke Sister    Emotional abuse Sister    Heart failure Father    Stroke Father    Alcohol abuse Father    Dementia Paternal Aunt    Alcohol abuse Paternal Uncle    Dementia Paternal Uncle    Dementia Paternal Grandmother    Stroke Paternal Grandmother    ADD / ADHD Cousin    Bipolar disorder Cousin    Anxiety disorder Cousin    Depression Cousin        Committed suicide   Alcohol abuse Cousin    OCD Cousin    Paranoid behavior Cousin    Brain cancer Maternal Grandfather    Heart defect Other        FAMILY HX   Colon cancer Maternal Uncle    Colon cancer Cousin    Colon polyps Neg Hx    Drug abuse Neg Hx    Schizophrenia Neg Hx    Seizures Neg Hx    Sexual abuse Neg Hx    Physical abuse Neg Hx     Social History   Socioeconomic History   Marital status: Divorced    Spouse name: Not on file   Number of children: 1   Years of education: Not on file   Highest education level: Not on file  Occupational History   Occupation: Disabled/retired  Tobacco Use   Smoking status: Former    Packs/day: 1.50    Years: 8.00    Total pack years: 12.00    Types: Cigarettes    Quit date: 08/13/1986    Years since quitting: 35.6    Passive exposure: Current   Smokeless tobacco: Never   Tobacco comments:    quit 20 + yrs ago  Vaping Use   Vaping Use: Never used  Substance and Sexual Activity   Alcohol use: Not Currently    Comment: Used to drink heavily- went to treatment in May 2020. No alcohol in 3 years (03/13/2022)   Drug use: Not Currently    Comment: pain medications, klonopin- none in the last 17 months. Currently in [place]   Sexual activity: Never  Other Topics Concern   Not on file  Social History Narrative   Not on file   Social Determinants of Health   Financial Resource Strain: Low Risk  (03/09/2022)   Overall Financial Resource Strain (CARDIA)    Difficulty of Paying Living Expenses: Not hard at all  Food Insecurity: No Food Insecurity (03/09/2022)   Hunger Vital  Sign    Worried About Running Out of Food in the Last Year: Never true    Ran Out of Food in the Last Year: Never true  Transportation Needs: No Transportation Needs (03/09/2022)   PRAPARE - Hydrologist (Medical): No    Lack of Transportation (Non-Medical): No  Physical Activity: Sufficiently Active (03/09/2022)   Exercise Vital Sign    Days of Exercise per Week: 7 days    Minutes of Exercise per Session: 150+ min  Stress: Stress Concern Present (03/09/2022)   [place]    Feeling of Stress : To some extent  Social Connections: Socially Isolated (03/09/2022)   Social Connection and Isolation Panel [NHANES]    Frequency of Communication with Friends and Family: More than three times a week    Frequency of Social Gatherings with Friends and Family: More than three times a week    Attends Religious Services: Never    Marine scientist or Organizations: No    Attends Archivist Meetings: Never    Marital Status: Divorced       Review of Systems: Gen: Denies fever, chills.  Admits to fatigue. CV: Denies chest pain, palpitations. Resp: Denies dyspnea at rest, cough. GI: see HPI Heme: See HPI  Observations/Objective: No distress. Alert and oriented. Pleasant. Well nourished. Normal mood and affect. Unable to perform complete physical exam due to video encounter.    Assessment:  65 y.o. male with history of GERD, dysphagia, constipation, colon polyps, hemorrhoids, and hep C genotype 1a, elastography F3/F4 s/p treatment in the past with [facility] and documented SVR. Post treatment elastography with kPa 6.2 in February 2021, presenting today for follow-up.   Chronic constipation: Well-controlled on Amitiza 24 mcg twice daily and senna couple times a day.    GERD: Fairly well controlled on Aciphex 20 mg daily, Pepcid 20 mg twice daily, and baclofen twice daily.  He is following  with [facility] health due to refractory symptoms as well as dysphagia.  Dysphagia: Currently following with [facility] health due to persistent dysphagia despite esophageal dilation and undergoing extensive evaluation including esophagram, modified barium swallow, esophageal manometry/pH testing, ENT work-up.  He continues to have intermittent trouble with solid foods and pills.  I suspect this is likely influenced by his significant dry mouth/sicca symptoms.  He will continue to follow with [place]: Recent blood work completed 03/12/2022 revealing hemoglobin of 12.7, normocytic indices, ferritin low at 12, iron low at 41, saturation low at 10%.  He denies overt GI bleeding.  Fairly recent EGD in December 2022 with [facility] with normal exam.  Last colonoscopy in April 2021  with poor prep, polyp removed but not retrieved.  He has been recommended to repeat a colonoscopy in 3 months, but this has yet to be completed.  Discussed proceeding with a colonoscopy, but patient again requested to hold off on this.  Advised he can go ahead and start oral iron daily.   Elevated LFTs: Mildly elevated LFTs since March 2022.  Regional normalization in November 2022.  Most recent labs 03/12/2022 with AST 45, ALT 46.  This is down slightly from AST 57, ALT 65 in June.  Patient reports he had been taking about 2000 mg of Tylenol as well as atorvastatin, but stopped this a few weeks ago.  Denies alcohol or illicit drug use.  He also takes several over-the-counter supplements.  Mild elevation could be medication induced; however, as enzymes have been elevated for over a year now, we will complete additional work-up, repeat hepatitis labs and check autoimmune serologies.  Notably, recent ANA was positive, but immunoglobulins within normal limits.  I will also update an ultrasound with elastography due to his history of hepatitis C and evidence of fibrosis prior to  treatment.  Thrombocytopenia: Mild intermittent thrombocytopenia.  Most recent labs 03/12/2022 with platelets 140.  No recent abdominal imaging.  Last ultrasound in 2021 with normal-appearing spleen, normal liver, median kPa 6.2. Will update [name] at this time to ensure he does not have underlying liver disease or splenomegaly.  RUQ pain: Several months of intermittent, daily RUQ abdominal pain described as a cramp lasting several hours at a time.  Not affected by meals.  Denies any associated nausea or vomiting.not affected by activity.  Seems to be worse when sitting down or talking.  States when he puts his tongue to the roof of his mouth or to the left side, he actually notes improvement.  With this description, suspect symptoms are less likely GI related, possibly some sort of neuropathic pain.  In general, this is very vague.  I will update an ultrasound with elastography to ensure no significant underlying biliary or liver pathology.     Plan: Hepatitis C RNA quantitative, hepatitis B surface antibody, hepatitis B surface antigen, hepatitis B core antibody, hepatitis A antibody, ASMA, AMA. Abdominal ultrasound with elastography. Encourage colonoscopy, but patient declined.  He will let me know if he changes his mind. Start ferrous sulfate 325 mg daily. Continue Amitiza 24 mcg twice daily. Continue senna as needed. Continue to avoid Tylenol. Continue to avoid alcohol. Avoid over-the-counter supplements and herbal teas though it is okay to continue multivitamin. Continue to follow with [facility] on reflux and dysphagia. Follow-up in 3 months or sooner if needed.     I discussed the assessment and treatment plan with the patient. The patient was provided an opportunity to ask questions and all were answered. The patient agreed with the plan and demonstrated an understanding of the instructions.   The patient was advised to call back or seek an in-person evaluation if the symptoms  worsen or if the condition fails to improve as anticipated.  I provided 28 minutes of video-face-to-face time during this encounter.  Aliene Altes, PA-C [facility]  03/13/2022

## 2022-03-11 NOTE — Progress Notes (Signed)
[facility] [address], [place] [address]   CLINIC:  Medical Oncology/Hematology  CONSULT NOTE  Patient Care Team: Fayrene Helper, MD as PCP - General (Family Medicine) Leta Baptist, MD as Consulting Physician (Otolaryngology) Drema Dallas, MD (Physical Medicine and Rehabilitation) Milly Jakob, MD as Consulting Physician (Orthopedic Surgery) Cloria Spring, MD as Consulting Physician ([place]) Eloise Harman, DO as Consulting Physician (Internal Medicine)  CHIEF COMPLAINTS/PURPOSE OF CONSULTATION:  Evaluation of abnormal immunofixation  HISTORY OF PRESENTING ILLNESS:  Marc [name] 66 y.o. male is here at the request of his rheumatologist (Dr. Estanislado Pandy) for evaluation of abnormal immunofixation.  Of note, patient was previously seen at [facility] from 2017-2018 due to leukopenia and thrombocytopenia in the setting of hepatitis C and fatty liver disease, as well as elevated ferritin/iron saturation.  Patient was lost to follow-up.  Immunofixation from 02/05/2022 showed poorly defined area of restricted protein mobility reactive with lambda light chain antisera.  Quantitative immunoglobulins were normal but on the lower limits of normal values.  SPEP negative for M spike, but was consistent with hypogammaglobulinemia (gammaglobulin 0.7).  Patient describes a cluster of various symptoms, for which he is seeing rheumatology.  He has chronic arthralgias and chronic pain syndrome.  He reports body aches and chills after eating.  He reports night sweats.  He reports that he lost about 20 pounds 2 years ago, but his weight has been stable over the past year.  He reports occasional tinnitus, and reports that he has "muffled hearing" that comes and goes.  He has dizziness with position changes.  He reports numbness and tingling in his hands and feet for the past 2 years, but denies any history of diabetes.  He denies any  unexplained fevers.  No blurry vision or headache.  No thromboembolic events since his last visit.  No new masses or lymphadenopathy per his report.  He has 50% energy and 60% appetite.  He reports that he is maintaining a stable weight at this time.  Patient's past medical history significant for osteoarthritis, chronic pain syndrome, history of hepatitis C and transaminitis (follows with Dr. Abbey Chatters at [facility]), GERD, recovering alcoholism in remission (quit drinking 2020), hypertension, prediabetes, history of colon polyps, anxiety/depression, vitamin D deficiency.  He follows with rheumatology due to positive ANA, sicca symptoms, constitutional symptoms, and arthritic pain (recently referred to [facility] for second opinion).  Patient lives alone and is functionally independent.  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  Patient has extensive past history of substance abuse, but has been sober for the past 3 years.  He reports daily heavy alcohol use on and off for about 45 years.  He reports previous heavy drug use including IV heroin, cocaine, MDMA, and opioid/benzodiazepine pills.  He worked very hard via Alcoholics Anonymous over the past 3 years.  No family history of multiple myeloma.  Mother deceased secondary to urachal bladder cancer.  Sister deceased secondary to metastatic stomach cancer.  Maternal grandmother with unspecified cancer.  Maternal grandfather with brain cancer.   MEDICAL HISTORY:  Past Medical History:  Diagnosis Date   Acute encephalopathy 12/29/2017   Hospitalized 5/22 to 5/23 with acute encephalopathy, unclear etiology   Acute gout involving toe of right foot 09/14/2019   Alcoholism ([place])    Allergy    Anxiety    Arthritis    Back problem    Cataract 01/2021  Chest wall pain 03/28/2017   Chronic hepatitis C without hepatic coma ([place]) 01/04/2009   Qualifier: Diagnosis of  By: Westly Pam. 2008: CT A/P NO  CIRRHOSIS AUG 2013 MRCP- DILATED CBD/NO CIRRHOSIS DEC 2013 AST 99-102  ALT 145-156 PLT 147 HB 13.4 ALB 4.4-4.7 T BIL 0.7    Depression    Dilation of biliary tract 07/14/2012   EUS JAN 2014 BENIGN CBD DILATION    Epigastric pain 07/14/2012   GERD (gastroesophageal reflux disease)    Gout    HCV (hepatitis C virus) 06/16/2015   Hepatitis B    Hepatitis C 1979   Hepatitis C reactive    LIVER BX 2008-CHRONIC ACTIVE HEPATITIS   HTN (hypertension)    Hx of adenomatous colonic polyps 2008   due for surveillance 2017   Hypercholesterolemia    IBS (irritable bowel syndrome)    Irritable bowel syndrome 01/04/2009   Qualifier: Diagnosis of  By: Westly Pam.    Knee pain    Left forearm pain 08/15/2017   Leukopenia 73/71/0626   Lichen planus    Narcotic addiction ([place]) 06/10/2011   [facility]- Oct 2012    Normal cardiac stress test 05/25/2011   [facility]   Normal echocardiogram 05/25/2011   EF 55%, mild TR   Oral lichen planus    Plaque psoriasis    RUQ pain 07/14/2012   Status post dilation of esophageal narrowing    Substance abuse ([place])    narcotic addiction   Thrombocytopenia ([place]) 11/06/2015   Trigger middle finger of left hand 06/20/2012    SURGICAL HISTORY: Past Surgical History:  Procedure Laterality Date   24 HOUR [place] STUDY  10/21/2020   Procedure: 24 HOUR [place] STUDY;  Surgeon: Mauri Pole, MD;  Location: [facility];  Service: Endoscopy;;   APPENDECTOMY     BALLOON DILATION N/A 05/28/2020   Procedure: [place];  Surgeon: Eloise Harman, DO;  Location[facility];  Service: Endoscopy;  Laterality: N/A;   BIOPSY  05/28/2020   Procedure: BIOPSY;  Surgeon: Eloise Harman, DO;  [facility];  Service: Endoscopy;;   CARPAL TUNNEL RELEASE     rt   COLONOSCOPY  2008 [facility] ARS D100 V8 PHEN 12.5   2 SIMPLE ADENOMAS (< 1 CM)   COLONOSCOPY WITH PROPOFOL N/A 12/05/2019   TI normal, 3 mm polyp in  ascending colon, external and internal hemorrhoids. Polyp removed but not retrieved. Poor prep   ESOPHAGEAL MANOMETRY N/A 10/21/2020   Procedure: ESOPHAGEAL MANOMETRY (EM);  Surgeon: Mauri Pole, MD;  Location: [facility];  Service: Endoscopy;  Laterality: N/A;   ESOPHAGOGASTRODUODENOSCOPY  04/26/2012   Dr. Oneida Alar: Non-erosive gastritis (inflammation) was found in the gastric antrum but no H.pylori; multiple biopsies (duodenal bx negative for Celiac)/ The mucosa of the esophagus appeared normal   ESOPHAGOGASTRODUODENOSCOPY (EGD) WITH PROPOFOL N/A 12/05/2019   mild gastritis, duodenitis due to aspirin/NSAIDs.    ESOPHAGOGASTRODUODENOSCOPY (EGD) WITH PROPOFOL N/A 05/28/2020   Procedure: ESOPHAGOGASTRODUODENOSCOPY (EGD) WITH PROPOFOL;  Surgeon: Eloise Harman, DO;  [facility];  Service: Endoscopy;  Laterality: N/A;  2:30pm   EUS  09/08/2012   Dr. Ardis Hughs: CBD dilated but no stones. Query secondary to Sphincter of Oddi stenosis, ?dysfunction, but clinically without symptoms   FRACTURE SURGERY N/A    [employer]rees[employer] 08/25/2020   HARDWARE REMOVAL Right 02/12/2014   Procedure: HARDWARE REMOVAL;  Surgeon: Jolyn Nap, MD;  Location:  [facility];  Service: Orthopedics;  Laterality: Right;   KNEE ARTHROSCOPY     Neurostimulator implant     POLYPECTOMY  12/05/2019   Procedure: POLYPECTOMY;  Surgeon: Danie Binder, MD;  [facility];  Service: Endoscopy;;   right ring finger     spinal stenosis, had screws put in neck  04/11/2009   [facility] [place] Right 02/12/2014   Procedure: RIGHT ULNAR SHORTENING AND OSTEOTOMY ;  Surgeon: Jolyn Nap, MD;  Location: [facility];  Service: Orthopedics;  Laterality: Right;   ULNAR NERVE TRANSPOSITION     UPPER GASTROINTESTINAL ENDOSCOPY  2008 SLF ABD PAIN WEIGHT LOSS d100 v8 phen 12.5   NL   WRIST SURGERY Right 08/2013   Dr. Edmonia Lynch     SOCIAL HISTORY: Social History   Socioeconomic History   Marital status: Divorced    Spouse name: Not on file   Number of children: 1   Years of education: Not on file   Highest education level: Not on file  Occupational History   Occupation: Disabled/retired  Tobacco Use   Smoking status: Former    Packs/day: 1.50    Years: 8.00    Total pack years: 12.00    Types: Cigarettes    Quit date: 08/13/1986    Years since quitting: 35.6    Passive exposure: Current   Smokeless tobacco: Never   Tobacco comments:    quit 20 + yrs ago  Vaping Use   Vaping Use: Never used  Substance and Sexual Activity   Alcohol use: Not Currently    Comment: Used to drink heavily- went to treatment in May 2020.    Drug use: Not Currently    Comment: pain medications, klonopin- none in the last 17 months. Currently in [place]   Sexual activity: Never  Other Topics Concern   Not on file  Social History Narrative   Not on file   Social Determinants of Health   Financial Resource Strain: Low Risk  (03/09/2022)   Overall Financial Resource Strain (CARDIA)    Difficulty of Paying Living Expenses: Not hard at all  Food Insecurity: No Food Insecurity (03/09/2022)   Hunger Vital Sign    Worried About Running Out of Food in the Last Year: Never true    Ran Out of Food in the Last Year: Never true  Transportation Needs: No Transportation Needs (03/09/2022)   PRAPARE - Hydrologist (Medical): No    Lack of Transportation (Non-Medical): No  Physical Activity: Sufficiently Active (03/09/2022)   Exercise Vital Sign    Days of Exercise per Week: 7 days    Minutes of Exercise per Session: 150+ min  Stress: Stress Concern Present (03/09/2022)   [place]    Feeling of Stress : To some extent  Social Connections: Socially Isolated (03/09/2022)   Social Connection and Isolation Panel [NHANES]    Frequency of  Communication with Friends and Family: More than three times a week    Frequency of Social Gatherings with Friends and Family: More than three times a week    Attends Religious Services: Never    Marine scientist or Organizations: No    Attends Archivist Meetings: Never    Marital Status: Divorced  Human resources officer Violence: Not At Risk (03/09/2022)   Humiliation, Afraid,  Rape, and Kick questionnaire    Fear of Current or Ex-Partner: No    Emotionally Abused: No    Physically Abused: No    Sexually Abused: No    FAMILY HISTORY: Family History  Problem Relation Age of Onset   Bladder Cancer Mother        rare form    Cancer Sister        Metastatic abdominal   Emotional abuse Sister    Stroke Sister    Emotional abuse Sister    Heart failure Father    Stroke Father    Alcohol abuse Father    Dementia Paternal Aunt    Alcohol abuse Paternal Uncle    Dementia Paternal Uncle    Dementia Paternal Grandmother    Stroke Paternal Grandmother    ADD / ADHD Cousin    Bipolar disorder Cousin    Anxiety disorder Cousin    Depression Cousin        Committed suicide   Alcohol abuse Cousin    OCD Cousin    Paranoid behavior Cousin    Brain cancer Maternal Grandfather    Heart defect Other        FAMILY HX   Colon cancer Maternal Uncle    Colon cancer Cousin    Colon polyps Neg Hx    Drug abuse Neg Hx    Schizophrenia Neg Hx    Seizures Neg Hx    Sexual abuse Neg Hx    Physical abuse Neg Hx     ALLERGIES:  is allergic to levofloxacin, ciprofloxacin, and sulfa antibiotics.  MEDICATIONS:  Current Outpatient Medications  Medication Sig Dispense Refill   AMBULATORY NON FORMULARY MEDICATION Kratom Take 2 tablet by mouth twice daily     AMBULATORY NON FORMULARY MEDICATION Friska caffiene 1-2 tablet twice daily     amLODipine (NORVASC) 2.5 MG tablet Take 1 tablet (2.5 mg total) by mouth daily. 30 tablet 0   Ascorbic Acid (VITAMIN C WITH ROSE HIPS) 500 MG tablet  Take 500 mg by mouth daily.      aspirin EC 81 MG tablet Take 81 mg by mouth daily.     baclofen (LIORESAL) 10 MG tablet TAKE (1/2) TABLET (5MG) BY MOUTH TWICE DAILY BEFORE A MEAL. 30 tablet 0   famotidine (PEPCID) 20 MG tablet TAKE 1 TABLET BY MOUTH TWICE DAILY. TAKE WITH BREAKFAST AND AT BEDTIME. 60 tablet 11   ibuprofen (ADVIL) 200 MG tablet Take 400 mg by mouth 2 (two) times daily.     lansoprazole (PREVACID) 30 MG capsule Take 1 capsule (30 mg total) by mouth 2 (two) times daily before a meal. 60 capsule 5   lubiprostone (AMITIZA) 24 MCG capsule TAKE 1 CAPSULE TWICE DAILY WITH MEALS. 60 capsule 0   Multiple Vitamin (MULTIVITAMIN WITH MINERALS) TABS tablet Take 1 tablet by mouth daily.     Nutritional Supplements (KETO PO) Take 1 tablet by mouth in the morning, at noon, and at bedtime.      pilocarpine (SALAGEN) 5 MG tablet Take 5 mg by mouth in the morning, at noon, in the evening, and at bedtime.     Probiotic Product (PROBIOTIC DAILY PO) Take by mouth daily.     RABEprazole (ACIPHEX) 20 MG tablet Take 20 mg by mouth daily.     senna (SENOKOT) 8.6 MG tablet Take 2 tablets by mouth 2 (two) times daily.     Turmeric 400 MG CAPS Take 800 mg by mouth daily.  Vitamin D3 (VITAMIN D) 25 MCG tablet Take 4,000 Units by mouth daily.      No current facility-administered medications for this visit.    REVIEW OF SYSTEMS:   Review of Systems  Constitutional:  Positive for fatigue. Negative for appetite change, chills, diaphoresis, fever and unexpected weight change.  HENT:   Positive for trouble swallowing. Negative for lump/mass and nosebleeds.   Eyes:  Negative for eye problems.  Respiratory:  Negative for cough, hemoptysis and shortness of breath.   Cardiovascular:  Positive for palpitations. Negative for chest pain and leg swelling.  Gastrointestinal:  Negative for abdominal pain, blood in stool, constipation, diarrhea, nausea and vomiting.  Genitourinary:  Negative for hematuria.    Musculoskeletal:  Positive for arthralgias, back pain and myalgias.  Skin: Negative.   Neurological:  Positive for numbness. Negative for dizziness, headaches and light-headedness.  Hematological:  Does not bruise/bleed easily.  Psychiatric/Behavioral:  Positive for sleep disturbance.       PHYSICAL EXAMINATION: ECOG PERFORMANCE STATUS: 1 - Symptomatic but completely ambulatory  There were no vitals filed for this visit. There were no vitals filed for this visit.  Physical Exam Constitutional:      Appearance: Normal appearance.  HENT:     Head: Normocephalic and atraumatic.     Mouth/Throat:     Mouth: Mucous membranes are moist.  Eyes:     Extraocular Movements: Extraocular movements intact.     Pupils: Pupils are equal, round, and reactive to light.  Cardiovascular:     Rate and Rhythm: Regular rhythm. Bradycardia present.     Pulses: Normal pulses.     Heart sounds: Normal heart sounds.  Pulmonary:     Effort: Pulmonary effort is normal.     Breath sounds: Normal breath sounds.  Abdominal:     General: Bowel sounds are normal.     Palpations: Abdomen is soft.     Tenderness: There is no abdominal tenderness.  Musculoskeletal:        General: No swelling.     Right lower leg: No edema.     Left lower leg: No edema.  Lymphadenopathy:     Cervical: No cervical adenopathy.  Skin:    General: Skin is warm and dry.  Neurological:     General: No focal deficit present.     Mental Status: He is alert and oriented to person, place, and time.  Psychiatric:        Mood and Affect: Mood normal.        Behavior: Behavior normal.       LABORATORY DATA:  I have reviewed the data as listed Recent Results (from the past 2160 hour(s))  CBC with Differential/Platelet     Status: None   Collection Time: 02/05/22  9:07 AM  Result Value Ref Range   WBC 5.4 3.8 - 10.8 Thousand/uL   RBC 4.46 4.20 - 5.80 Million/uL   Hemoglobin 13.4 13.2 - 17.1 g/dL   HCT 40.0 38.5 - 50.0 %    MCV 89.7 80.0 - 100.0 fL   MCH 30.0 27.0 - 33.0 pg   MCHC 33.5 32.0 - 36.0 g/dL   RDW 13.2 11.0 - 15.0 %   Platelets 142 140 - 400 Thousand/uL   MPV 11.4 7.5 - 12.5 fL   Neutro Abs 3,850 1,500 - 7,800 cells/uL   Lymphs Abs 1,042 850 - 3,900 cells/uL   Absolute Monocytes 346 200 - 950 cells/uL   Eosinophils Absolute 130 15 - 500 cells/uL  Basophils Absolute 32 0 - 200 cells/uL   Neutrophils Relative % 71.3 %   Total Lymphocyte 19.3 %   Monocytes Relative 6.4 %   Eosinophils Relative 2.4 %   Basophils Relative 0.6 %  COMPLETE METABOLIC PANEL WITH GFR     Status: Abnormal   Collection Time: 02/05/22  9:07 AM  Result Value Ref Range   Glucose, Bld 103 (H) 65 - 99 mg/dL    Comment: .            Fasting reference interval . For someone without known diabetes, a glucose value between 100 and 125 mg/dL is consistent with prediabetes and should be confirmed with a follow-up test. .    BUN 26 (H) 7 - 25 mg/dL   Creat 0.88 0.70 - 1.35 mg/dL   eGFR 94 > OR = 60 mL/min/1.56m    Comment: The eGFR is based on the CKD-EPI 2021 equation. To calculate  the new eGFR from a previous Creatinine or Cystatin C result, go to https://www.kidney.org/professionals/ kdoqi/gfr%5Fcalculator    BUN/Creatinine Ratio 30 (H) 6 - 22 (calc)   Sodium 140 135 - 146 mmol/L   Potassium 4.0 3.5 - 5.3 mmol/L   Chloride 106 98 - 110 mmol/L   CO2 28 20 - 32 mmol/L   Calcium 9.6 8.6 - 10.3 mg/dL   Total Protein 6.9 6.1 - 8.1 g/dL   Albumin 4.4 3.6 - 5.1 g/dL   Globulin 2.5 1.9 - 3.7 g/dL (calc)   AG Ratio 1.8 1.0 - 2.5 (calc)   Total Bilirubin 0.3 0.2 - 1.2 mg/dL   Alkaline phosphatase (APISO) 60 35 - 144 U/L   AST 57 (H) 10 - 35 U/L   ALT 65 (H) 9 - 46 U/L  Urinalysis, Routine w reflex microscopic     Status: None   Collection Time: 02/05/22  9:07 AM  Result Value Ref Range   Color, Urine YELLOW YELLOW   APPearance CLEAR CLEAR   Specific Gravity, Urine 1.016 1.001 - 1.035   pH 6.5 5.0 - 8.0    Glucose, UA NEGATIVE NEGATIVE   Bilirubin Urine NEGATIVE NEGATIVE   Ketones, ur NEGATIVE NEGATIVE   Hgb urine dipstick NEGATIVE NEGATIVE   Protein, ur NEGATIVE NEGATIVE   Nitrite NEGATIVE NEGATIVE   Leukocytes,Ua NEGATIVE NEGATIVE  CK     Status: Abnormal   Collection Time: 02/05/22  9:07 AM  Result Value Ref Range   Total CK 532 (H) 44 - 196 U/L  Sedimentation rate     Status: None   Collection Time: 02/05/22  9:07 AM  Result Value Ref Range   Sed Rate 2 0 - 20 mm/h  Rheumatoid factor     Status: None   Collection Time: 02/05/22  9:07 AM  Result Value Ref Range   Rhuematoid fact SerPl-aCnc <14 <14 IU/mL  ANA     Status: Abnormal   Collection Time: 02/05/22  9:07 AM  Result Value Ref Range   Anti Nuclear Antibody (ANA) POSITIVE (A) NEGATIVE    Comment: ANA IFA is a first line screen for detecting the presence of up to approximately 150 autoantibodies in various autoimmune diseases. A positive ANA IFA result is suggestive of autoimmune disease and reflexes to titer and pattern. Further laboratory testing may be considered if clinically indicated. . For additional information, please refer to http://education.[facility].com/faq/FAQ177 (This link is being provided for informational/ educational purposes only.) .   Sjogrens syndrome-A extractable nuclear antibody     Status: None  Collection Time: 02/05/22  9:07 AM  Result Value Ref Range   SSA (Ro) (ENA) Antibody, IgG <1.0 NEG <1.0 NEG AI  Sjogrens syndrome-B extractable nuclear antibody     Status: None   Collection Time: 02/05/22  9:07 AM  Result Value Ref Range   SSB (La) (ENA) Antibody, IgG <1.0 NEG <1.0 NEG AI  C3 and C4     Status: None   Collection Time: 02/05/22  9:07 AM  Result Value Ref Range   C3 Complement 132 82 - 185 mg/dL   C4 Complement 32 15 - 53 mg/dL  Serum protein electrophoresis with reflex     Status: Abnormal   Collection Time: 02/05/22  9:07 AM  Result Value Ref Range   Total Protein 6.8  6.1 - 8.1 g/dL   Albumin ELP 4.2 3.8 - 4.8 g/dL   Alpha 1 0.3 0.2 - 0.3 g/dL   Alpha 2 0.9 0.5 - 0.9 g/dL   Beta Globulin 0.4 0.4 - 0.6 g/dL   Beta 2 0.3 0.2 - 0.5 g/dL   Gamma Globulin 0.7 (L) 0.8 - 1.7 g/dL   Abnormal Protein Band1 NOTE NONE DETECTED g/dL   SPE Interp.      Comment: . Consistent with hypogammaglobulinemia. Serum free light  chains or urine immunofixation should be considered if  plasma cell dyscrasias are a possible clinical  diagnosis. .   IgG, IgA, IgM     Status: None   Collection Time: 02/05/22  9:07 AM  Result Value Ref Range   Immunoglobulin A 122 70 - 320 mg/dL   IgG (Immunoglobin G), Serum 760 600 - 1,540 mg/dL   IgM, Serum 57 50 - 300 mg/dL  Anti-nuclear ab-titer (ANA titer)     Status: Abnormal   Collection Time: 02/05/22  9:07 AM  Result Value Ref Range   ANA Titer 1 1:640 (H) titer    Comment:                 Reference Range                 <1:40        Negative                 1:40-1:80    Low Antibody Level                 >1:80        Elevated Antibody Level .    ANA Pattern 1 Cytoplasmic, Rods and Rings (A)     Comment: Distinct rod and ring structures in the cytoplasm of interphase cells. Pattern is seen in hepatitis C patients post-IFN/ribavirin therapy, rarely in systemic lupus erythematosus (SLE), Hashimoto's thyroiditis, and may be found in healthy persons. . AC-23: Rods and Rings . International Consensus on ANA Patterns (https://www.hernandez-brewer.[email or web address]/)   IFE Interpretation     Status: None   Collection Time: 02/05/22  9:07 AM  Result Value Ref Range   Immunofix Electr Int      Comment: . A poorly-defined area of restricted protein mobility is detected and is reactive with lambda light chain antisera. .   Aldolase     Status: None   Collection Time: 03/02/22  2:11 PM  Result Value Ref Range   Aldolase 4.5 < OR = 8.1 U/L  3-Hydroxy-3-Methylglutaryl-Coenzyme A Reductase (HMGCR) AB (IgG)     Status: None    Collection Time: 03/02/22  2:11 PM  Result Value Ref Range   HMGCR AB (IGG) <2 <  20 CU    Comment: . 3-Hydroxy-3-Methylglutaryl-Coenzyme A Reductase (HMGCR) Ab is associated with necrotizing myopathy and is often found with the use of statin medications. Rarely, HMGCR Ab associated myositis has also been seen in patients ingesting mushrooms and other foods.   Myositis Specific II Antibodies Panel     Status: None   Collection Time: 03/02/22  2:11 PM  Result Value Ref Range   JO-1 AB <11 <11 SI   PL-7 AB <11 <11 SI   PL-12 AB <11 <11 SI   EJ AB <11 <11 SI   OJ AB <11 <11 SI   SRP-AB <11 <11 SI   MI-2 ALPHA AB <11 <11 SI   MI-2 BETA AB <11 <11 SI   MDA-5 AB <11 <11 SI   TIF-1y AB <11 <11 SI   NXP-2 AB <11 <11 SI    Comment: Myositis-specific autoantibodies (MSAs) are highly selective, generally mutually exclusive, and are associated with a particular clinical phenotype within the myositis spectrum.  Marland Kitchen Anti-synthetase syndrome is associated with MSAs to cytoplasmic enzymes and tRNAs involved with the synthesis of proteins. Target antigens include Jo-1, PL-7, PL-12, EJ, and OJ. Clinically, anti-synthetase syndrome is primarily characterized by myositis and lung inflammation. . Dermatomyositis is associated with MSAs to SRP, Mi-2A, Mi-2B, and clinically this disease is characterized by myositis in association with a rash. Additionally, MSAs to MDA5 (CADM140) have been identified in patients with clinically amyopathic dermatomyositis and rapidly progressive lung disease. MSAs to TIF1-y and NXP-2, collectively, are seen in >40% of children with dermatomyositis and appear to identify those with more severe disease. Finally, TIF1-y Ab has been reported in adults with dermatomyositis and is a ssociated with malignancy, but not in children. . SRP Ab has also been associated with necrotizing myopathy, a disease with unique histological features and an aggressive clinical  course. . This test was developed and its analytical performance  characteristics have been determined by [employer]. It has not been cleared or approved by the FDA. This assay has been validated pursuant to the CLIA regulations and is used for clinical purposes.     RADIOGRAPHIC STUDIES: I have personally reviewed the radiological images as listed and agreed with the findings in the report. No results found.  ASSESSMENT & PLAN: 1.  Abnormal SPEP/immunofixation - Patient seen at the request of his rheumatologist (Dr. Estanislado Pandy) for evaluation of abnormal immunofixation. - Labs from rheumatology: Immunofixation from 02/05/2022 showed poorly defined area of restricted protein mobility reactive with lambda light chain antisera.   Quantitative immunoglobulins were normal but on the lower limits of normal values.   SPEP negative for M spike, but was consistent with hypogammaglobulinemia (gamma globulin 0.7). - Constellation of symptoms (also seen rheumatology) including chronic pain syndrome, chills, night sweats, tinnitus, intermittent hearing loss, positional dizziness, and nondiabetic peripheral neuropathy. - No new bone pain, unexplained fevers, or unintentional weight loss. - PLAN: We will check MGUS/myeloma panel to include SPEP, immunofixation, kappa/lambda free light chains, beta-2 microglobulin, LDH, CBC, and CMP. - If any significant abnormalities will consider 24-hour urine study and skeletal survey.    2.  Thrombocytopenia and leukopenia - Previously seen at [facility] from 2017-2018 due to leukopenia and thrombocytopenia in the setting of hepatitis C and fatty liver disease, as well as elevated ferritin/iron saturation. - Patient was lost to follow-up. - PLAN: We will check CBC and iron panel.  3.  Other history - PMH: Osteoarthritis, chronic pain syndrome, history of hepatitis C and  transaminitis (follows with Dr. Abbey Chatters at [facility]), GERD, recovering  alcoholism in remission (quit drinking 2020), hypertension, prediabetes, history of colon polyps, anxiety/depression, vitamin D deficiency.  He follows with rheumatology due to positive ANA, sicca symptoms, constitutional symptoms, and arthritic pain (recently referred to [facility] for second opinion). - SOCIAL:  Lives alone and is functionally independent.  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 - SUBSTANCE: Past history of substance abuse, but has been sober for the past 3 years.  He reports prior history of daily heavy alcohol use on and off for about 45 years.  He reports previous heavy drug use including IV heroin, cocaine, MDMA, and opioid/benzodiazepine pills.  He worked very hard via Alcoholics Anonymous over the past 3 years. - FAMILY: No family history of multiple myeloma.  Mother deceased secondary to urachal bladder cancer.  Sister deceased secondary to metastatic stomach cancer.  Maternal grandmother with unspecified cancer.  Maternal grandfather with brain cancer.    All questions were answered. The patient knows to call the clinic with any problems, questions or concerns.   Medical decision making: Moderate  Time spent on visit: I spent 30 minutes counseling the patient face to face. The total time spent in the appointment was 55 minutes and more than 50% was on counseling.  I, Tarri Abernethy PA-C, have seen this patient in conjunction with Dr. Derek Jack.  Greater than 50% of visit was performed by Dr. Delton Coombes.   Marc OLSHEFSKI, PA-C 03/13/2022 7:42 AM  DR. Alanzo Lamb: I have independently evaluated this patient and formulated my assessment and plan.  I agree with HPI, assessment and plan written by Casey Burkitt, PA-C.  Patient evaluated at the request of Dr. Estanislado Pandy for abnormal SPEP.  We talked about the spectrum of plasma cell disorders.  We will repeat SPEP, check serum immunofixation and free light  chains.  If there is any abnormalities on this test, will consider further work-up with urine studies and skeletal survey.  Follow-up with [name] in a couple of weeks to discuss results and further plan.

## 2022-03-12 ENCOUNTER — Encounter: Payer: Self-pay | Admitting: Hematology

## 2022-03-12 ENCOUNTER — Inpatient Hospital Stay: Payer: Medicare HMO

## 2022-03-12 ENCOUNTER — Inpatient Hospital Stay: Payer: Medicare HMO | Attending: Hematology | Admitting: Hematology

## 2022-03-12 VITALS — BP 122/65 | HR 47 | Temp 97.7°F | Resp 16 | Ht 68.0 in | Wt 140.2 lb

## 2022-03-12 DIAGNOSIS — I10 Essential (primary) hypertension: Secondary | ICD-10-CM | POA: Insufficient documentation

## 2022-03-12 DIAGNOSIS — F1011 Alcohol abuse, in remission: Secondary | ICD-10-CM | POA: Diagnosis not present

## 2022-03-12 DIAGNOSIS — R769 Abnormal immunological finding in serum, unspecified: Secondary | ICD-10-CM | POA: Diagnosis not present

## 2022-03-12 DIAGNOSIS — D696 Thrombocytopenia, unspecified: Secondary | ICD-10-CM | POA: Diagnosis not present

## 2022-03-12 DIAGNOSIS — Z86010 Personal history of colonic polyps: Secondary | ICD-10-CM | POA: Insufficient documentation

## 2022-03-12 DIAGNOSIS — D72819 Decreased white blood cell count, unspecified: Secondary | ICD-10-CM | POA: Insufficient documentation

## 2022-03-12 DIAGNOSIS — Z79899 Other long term (current) drug therapy: Secondary | ICD-10-CM | POA: Diagnosis not present

## 2022-03-12 DIAGNOSIS — Z8052 Family history of malignant neoplasm of bladder: Secondary | ICD-10-CM | POA: Insufficient documentation

## 2022-03-12 DIAGNOSIS — R7989 Other specified abnormal findings of blood chemistry: Secondary | ICD-10-CM

## 2022-03-12 DIAGNOSIS — E559 Vitamin D deficiency, unspecified: Secondary | ICD-10-CM | POA: Insufficient documentation

## 2022-03-12 DIAGNOSIS — K219 Gastro-esophageal reflux disease without esophagitis: Secondary | ICD-10-CM | POA: Insufficient documentation

## 2022-03-12 DIAGNOSIS — B1920 Unspecified viral hepatitis C without hepatic coma: Secondary | ICD-10-CM | POA: Diagnosis not present

## 2022-03-12 DIAGNOSIS — Z7982 Long term (current) use of aspirin: Secondary | ICD-10-CM | POA: Insufficient documentation

## 2022-03-12 DIAGNOSIS — M1990 Unspecified osteoarthritis, unspecified site: Secondary | ICD-10-CM | POA: Insufficient documentation

## 2022-03-12 DIAGNOSIS — R778 Other specified abnormalities of plasma proteins: Secondary | ICD-10-CM | POA: Diagnosis not present

## 2022-03-12 DIAGNOSIS — B182 Chronic viral hepatitis C: Secondary | ICD-10-CM | POA: Insufficient documentation

## 2022-03-12 LAB — COMPREHENSIVE METABOLIC PANEL
ALT: 46 U/L — ABNORMAL HIGH (ref 0–44)
AST: 45 U/L — ABNORMAL HIGH (ref 15–41)
Albumin: 3.9 g/dL (ref 3.5–5.0)
Alkaline Phosphatase: 63 U/L (ref 38–126)
Anion gap: 4 — ABNORMAL LOW (ref 5–15)
BUN: 28 mg/dL — ABNORMAL HIGH (ref 8–23)
CO2: 27 mmol/L (ref 22–32)
Calcium: 9.3 mg/dL (ref 8.9–10.3)
Chloride: 108 mmol/L (ref 98–111)
Creatinine, Ser: 0.76 mg/dL (ref 0.61–1.24)
GFR, Estimated: >60 mL/min (ref >60)
Glucose, Bld: 117 mg/dL — ABNORMAL HIGH (ref 70–99)
Potassium: 4.2 mmol/L (ref 3.5–5.1)
Sodium: 139 mmol/L (ref 135–145)
Total Bilirubin: 0.5 mg/dL (ref 0.3–1.2)
Total Protein: 7 g/dL (ref 6.5–8.1)

## 2022-03-12 LAB — CBC WITH DIFFERENTIAL/PLATELET
Abs Immature Granulocytes: 0.01 10*3/uL (ref 0.00–0.07)
Basophils Absolute: 0 10*3/uL (ref 0.0–0.1)
Basophils Relative: 1 %
Eosinophils Absolute: 0.1 10*3/uL (ref 0.0–0.5)
Eosinophils Relative: 3 %
HCT: 38.3 % — ABNORMAL LOW (ref 39.0–52.0)
Hemoglobin: 12.7 g/dL — ABNORMAL LOW (ref 13.0–17.0)
Immature Granulocytes: 0 %
Lymphocytes Relative: 22 %
Lymphs Abs: 1.1 10*3/uL (ref 0.7–4.0)
MCH: 30.2 pg (ref 26.0–34.0)
MCHC: 33.2 g/dL (ref 30.0–36.0)
MCV: 91 fL (ref 80.0–100.0)
Monocytes Absolute: 0.3 10*3/uL (ref 0.1–1.0)
Monocytes Relative: 6 %
Neutro Abs: 3.4 10*3/uL (ref 1.7–7.7)
Neutrophils Relative %: 68 %
Platelets: 140 10*3/uL — ABNORMAL LOW (ref 150–400)
RBC: 4.21 MIL/uL — ABNORMAL LOW (ref 4.22–5.81)
RDW: 13.6 % (ref 11.5–15.5)
WBC: 5 10*3/uL (ref 4.0–10.5)
nRBC: 0 % (ref 0.0–0.2)

## 2022-03-12 LAB — IRON AND TIBC
Iron: 41 ug/dL — ABNORMAL LOW (ref 45–182)
Saturation Ratios: 10 % — ABNORMAL LOW (ref 17.9–39.5)
TIBC: 402 ug/dL (ref 250–450)
UIBC: 361 ug/dL

## 2022-03-12 LAB — LACTATE DEHYDROGENASE: LDH: 193 U/L — ABNORMAL HIGH (ref 98–192)

## 2022-03-12 LAB — FERRITIN: Ferritin: 12 ng/mL — ABNORMAL LOW (ref 24–336)

## 2022-03-12 NOTE — Patient Instructions (Signed)
[place] at [facility] Discharge Instructions  You were seen today by Dr. Delton Coombes & Tarri Abernethy PA-C for your abnormal protein labs.  As we discussed, you had a mild abnormality in the labs that were checked by Dr. Estanislado Pandy.  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  We will check additional labs today and we will see you for follow-up visit in 2 to 4 weeks to discuss results.   - - - - - - - - - - - - - - - - - -    Thank you for choosing [place] at [facility] to provide your oncology and hematology care.  To afford each patient quality time with our provider, please arrive at least 15 minutes before your scheduled appointment time.   If you have a lab appointment with the [place] please come in thru the Main Entrance and check in at the main information desk.  You need to re-schedule your appointment should you arrive 10 or more minutes late.  We strive to give you quality time with our providers, and arriving late affects you and other patients whose appointments are after yours.  Also, if you no show three or more times for appointments you may be dismissed from the clinic at the providers discretion.     Again, thank you for choosing [facility].  Our hope is that these requests will decrease the amount of time that you wait before being seen by our physicians.       _____________________________________________________________  Should you have questions after your visit to [facility], please contact our office at [phone] and follow the prompts.  Our office hours are 8:00 a.m. and 4:30 p.m. Monday - Friday.  Please note that voicemails left after 4:00 p.m. may not be returned until the following business day.  We are closed weekends and major holidays.  You do have access to a nurse 24-7,  just call the main number to the clinic [phone] and do not press any options, hold on the line and a nurse will answer the phone.    For prescription refill requests, have your pharmacy contact our office and allow 72 hours.    Due to Covid, you will need to wear a mask upon entering the hospital. If you do not have a mask, a mask will be given to you at the Main Entrance upon arrival. For doctor visits, patients may have 1 support person age 24 or older with them. For treatment visits, patients can not have anyone with them due to social distancing guidelines and our immunocompromised population.

## 2022-03-13 ENCOUNTER — Telehealth: Payer: Self-pay | Admitting: *Deleted

## 2022-03-13 ENCOUNTER — Encounter: Payer: Self-pay | Admitting: Gastroenterology

## 2022-03-13 ENCOUNTER — Telehealth (INDEPENDENT_AMBULATORY_CARE_PROVIDER_SITE_OTHER): Payer: Medicare HMO | Admitting: Gastroenterology

## 2022-03-13 ENCOUNTER — Telehealth: Payer: Self-pay | Admitting: Gastroenterology

## 2022-03-13 ENCOUNTER — Other Ambulatory Visit: Payer: Self-pay | Admitting: Internal Medicine

## 2022-03-13 VITALS — Ht 68.0 in | Wt 139.0 lb

## 2022-03-13 DIAGNOSIS — R1310 Dysphagia, unspecified: Secondary | ICD-10-CM

## 2022-03-13 DIAGNOSIS — Z8619 Personal history of other infectious and parasitic diseases: Secondary | ICD-10-CM | POA: Diagnosis not present

## 2022-03-13 DIAGNOSIS — R7989 Other specified abnormal findings of blood chemistry: Secondary | ICD-10-CM

## 2022-03-13 DIAGNOSIS — D509 Iron deficiency anemia, unspecified: Secondary | ICD-10-CM

## 2022-03-13 DIAGNOSIS — K5900 Constipation, unspecified: Secondary | ICD-10-CM

## 2022-03-13 DIAGNOSIS — K219 Gastro-esophageal reflux disease without esophagitis: Secondary | ICD-10-CM

## 2022-03-13 DIAGNOSIS — R1011 Right upper quadrant pain: Secondary | ICD-10-CM | POA: Diagnosis not present

## 2022-03-13 HISTORY — DX: Iron deficiency anemia, unspecified: D50.9

## 2022-03-13 LAB — BETA 2 MICROGLOBULIN, SERUM: Beta-2 Microglobulin: 1.5 mg/L (ref 0.6–2.4)

## 2022-03-13 LAB — KAPPA/LAMBDA LIGHT CHAINS
Kappa free light chain: 24.2 mg/L — ABNORMAL HIGH (ref 3.3–19.4)
Kappa, lambda light chain ratio: 2 — ABNORMAL HIGH (ref 0.26–1.65)
Lambda free light chains: 12.1 mg/L (ref 5.7–26.3)

## 2022-03-13 MED ORDER — BACLOFEN 10 MG PO TABS: ORAL_TABLET | ORAL | 1 refills | Status: DC

## 2022-03-13 MED ORDER — LUBIPROSTONE 24 MCG PO CAPS: ORAL_CAPSULE | ORAL | 5 refills | Status: DC

## 2022-03-13 NOTE — Telephone Encounter (Signed)
[name] scheduled and appt info sent

## 2022-03-13 NOTE — Telephone Encounter (Signed)
Labs mailed

## 2022-03-13 NOTE — Telephone Encounter (Signed)
Marc [name], you are scheduled for a virtual visit with your provider today.  Just as we do with appointments in the office, we must obtain your consent to participate.  Your consent will be active for this visit and any virtual visit you may have with one of our providers in the next 365 days.  If you have a MyChart account, I can also send a copy of this consent to you electronically.  All virtual visits are billed to your insurance company just like a traditional visit in the office.  As this is a virtual visit, video technology does not allow for your provider to perform a traditional examination.  This may limit your provider's ability to fully assess your condition.  If your provider identifies any concerns that need to be evaluated in person or the need to arrange testing such as labs, EKG, etc, we will make arrangements to do so.  Although advances in technology are sophisticated, we cannot ensure that it will always work on either your end or our end.  If the connection with a video visit is poor, we may have to switch to a telephone visit.  With either a video or telephone visit, we are not always able to ensure that we have a secure connection.   I need to obtain your verbal consent now.   Are you willing to proceed with your visit today? Patient consent to virtual visit.

## 2022-03-13 NOTE — Telephone Encounter (Signed)
Mindy: Please arrange abdominal ultrasound with elastography.  Orders have already been placed.  [name]: Please mail lab orders to patient that were placed at today's virtual visit.  Manuela Schwartz: Patient needs follow-up visit with Dr. Abbey Chatters in 3 months.  Please arrange in person.

## 2022-03-13 NOTE — Patient Instructions (Addendum)
Please have labs completed at [place].  We will arrange for you to have an ultrasound of your abdomen at [facility] in the near future.  As we discussed, your recent blood work shows that you have iron deficiency anemia.  You should start ferrous sulfate 325 mg daily.  You can pick this up over-the-counter.  You also need a colonoscopy due to history of colon polyps.  This is also important to further evaluate your iron deficiency anemia.  If you change your mind and would like to go ahead and proceed with scheduling a colonoscopy, please let me know.  For constipation: Continue Amitiza 24 mcg twice daily Continue senna as needed.  For elevated liver enzymes:  We are working this up with additional blood work and rechecking an ultrasound. Continue to avoid Tylenol for now. Continue to avoid alcohol. Avoid over-the-counter supplements and herbal teas. You may continue your multivitamin.  For reflux and swallowing problems: Continue to follow with [facility]. I have refilled your baclofen for you.  We will plan to follow-up with you in about 3 months.  Do not hesitate to call sooner if needed.   It was good to see you again today!  Aliene Altes, PA-C [facility]

## 2022-03-16 DIAGNOSIS — D509 Iron deficiency anemia, unspecified: Secondary | ICD-10-CM | POA: Diagnosis not present

## 2022-03-16 DIAGNOSIS — R7989 Other specified abnormal findings of blood chemistry: Secondary | ICD-10-CM | POA: Diagnosis not present

## 2022-03-16 DIAGNOSIS — Z8619 Personal history of other infectious and parasitic diseases: Secondary | ICD-10-CM | POA: Diagnosis not present

## 2022-03-16 DIAGNOSIS — R1011 Right upper quadrant pain: Secondary | ICD-10-CM | POA: Diagnosis not present

## 2022-03-16 LAB — PROTEIN ELECTROPHORESIS, SERUM
A/G Ratio: 1.5 (ref 0.7–1.7)
Albumin ELP: 3.8 g/dL (ref 2.9–4.4)
Alpha-1-Globulin: 0.2 g/dL (ref 0.0–0.4)
Alpha-2-Globulin: 0.7 g/dL (ref 0.4–1.0)
Beta Globulin: 0.9 g/dL (ref 0.7–1.3)
Gamma Globulin: 0.7 g/dL (ref 0.4–1.8)
Globulin, Total: 2.5 g/dL (ref 2.2–3.9)
Total Protein ELP: 6.3 g/dL (ref 6.0–8.5)

## 2022-03-16 LAB — IMMUNOFIXATION ELECTROPHORESIS
IgA: 113 mg/dL (ref 61–437)
IgG (Immunoglobin G), Serum: 788 mg/dL (ref 603–1613)
IgM (Immunoglobulin M), Srm: 68 mg/dL (ref 20–172)
Total Protein ELP: 6.1 g/dL (ref 6.0–8.5)

## 2022-03-19 ENCOUNTER — Ambulatory Visit: Payer: Medicare HMO | Admitting: Gastroenterology

## 2022-03-19 LAB — HEPATITIS B CORE ANTIBODY, TOTAL: Hep B Core Total Ab: REACTIVE — AB

## 2022-03-19 LAB — HEPATITIS C RNA QUANTITATIVE
HCV Quantitative Log: <1.18 log IU/mL
HCV RNA, PCR, QN: <15 IU/mL

## 2022-03-19 LAB — HEPATITIS A ANTIBODY, TOTAL: Hepatitis A AB,Total: REACTIVE — AB

## 2022-03-19 LAB — HEPATITIS B SURFACE ANTIGEN: Hepatitis B Surface Ag: NONREACTIVE

## 2022-03-19 LAB — HEPATITIS B SURFACE ANTIBODY,QUALITATIVE: Hep B S Ab: REACTIVE — AB

## 2022-03-19 LAB — MITOCHONDRIAL ANTIBODIES: Mitochondrial M2 Ab, IgG: <=20 U (ref <=20.0)

## 2022-03-19 LAB — ANTI-SMOOTH MUSCLE ANTIBODY, IGG: Actin (Smooth Muscle) Antibody (IGG): <20 U (ref <20)

## 2022-03-20 ENCOUNTER — Other Ambulatory Visit: Payer: Self-pay | Admitting: *Deleted

## 2022-03-20 DIAGNOSIS — R768 Other specified abnormal immunological findings in serum: Secondary | ICD-10-CM

## 2022-03-20 DIAGNOSIS — R7989 Other specified abnormal findings of blood chemistry: Secondary | ICD-10-CM

## 2022-03-23 ENCOUNTER — Ambulatory Visit (HOSPITAL_COMMUNITY)
Admission: RE | Admit: 2022-03-23 | Discharge: 2022-03-23 | Disposition: A | Discharge status: Home / Self Care | Payer: Medicare HMO | Source: Ambulatory Visit | Attending: Gastroenterology | Admitting: Gastroenterology

## 2022-03-23 ENCOUNTER — Other Ambulatory Visit: Payer: Self-pay

## 2022-03-23 DIAGNOSIS — R1011 Right upper quadrant pain: Secondary | ICD-10-CM | POA: Diagnosis not present

## 2022-03-23 DIAGNOSIS — R7989 Other specified abnormal findings of blood chemistry: Secondary | ICD-10-CM

## 2022-03-23 DIAGNOSIS — R768 Other specified abnormal immunological findings in serum: Secondary | ICD-10-CM

## 2022-03-23 DIAGNOSIS — Z8619 Personal history of other infectious and parasitic diseases: Secondary | ICD-10-CM | POA: Insufficient documentation

## 2022-03-25 ENCOUNTER — Encounter: Payer: Self-pay | Admitting: *Deleted

## 2022-03-26 DIAGNOSIS — R768 Other specified abnormal immunological findings in serum: Secondary | ICD-10-CM | POA: Diagnosis not present

## 2022-03-26 DIAGNOSIS — R7989 Other specified abnormal findings of blood chemistry: Secondary | ICD-10-CM | POA: Diagnosis not present

## 2022-03-31 LAB — HEPATITIS B DNA, ULTRAQUANTITATIVE, PCR
Hepatitis B DNA: NOT DETECTED IU/mL
Hepatitis B virus DNA: NOT DETECTED Log IU/mL

## 2022-04-08 NOTE — Progress Notes (Unsigned)
[place], [place] [address]   CLINIC:  Medical Oncology/Hematology  PCP:  Fayrene Helper, MD [address] [place] [address] [phone]   REASON FOR VISIT:  Follow-up for abnormal immunofixation  PRIOR THERAPY: None  CURRENT THERAPY: Under work-up  INTERVAL HISTORY:  Marc [name] 66 y.o. male returns for routine follow-up of abnormal immunofixation.  He was seen for initial visit by Dr. Delton Coombes and Tarri Abernethy PA-C on 03/12/2022.  He denies any changes in his health since his initial visit 1 month ago.  He continues to report the same constellation of symptoms (also seeing rheumatology) including chronic pain syndrome, dry mouth, chills, night sweats, tinnitus, intermittent hearing loss, positional dizziness, and nondiabetic peripheral neuropathy. No new bone pain, unexplained fevers, or unintentional weight loss.  No new masses or lymphadenopathy.  He denies any signs of GI blood loss such as bright blood per rectum or melena.  He follows with Dr. Truddie Crumble at [facility] as well as Dr. Penelope Coop, PA-C at [facility].  He reports that he had EGD at [facility] in December 2022, which was reportedly normal.  He has had previous issues with gastritis and duodenitis secondary to NSAID use, but has started taking ibuprofen twice daily for the past year, as he is unable to take Tylenol due to his liver disease.  He admits to fatigue but denies any pica.  He has 40% energy and 80% appetite. He endorses that he is maintaining a stable weight.   REVIEW OF SYSTEMS:    Review of Systems  Constitutional:  Positive for fatigue. Negative for appetite change, chills, diaphoresis, fever and unexpected weight change.  HENT:   Positive for trouble swallowing (dry mouth). Negative for lump/mass and nosebleeds.   Eyes:  Negative for eye problems.  Respiratory:  Negative for cough, hemoptysis  and shortness of breath.   Cardiovascular:  Negative for chest pain, leg swelling and palpitations.  Gastrointestinal:  Positive for constipation. Negative for abdominal pain, blood in stool, diarrhea, nausea and vomiting.  Genitourinary:  Negative for hematuria.   Skin: Negative.   Neurological:  Positive for numbness. Negative for dizziness, headaches and light-headedness.  Hematological:  Does not bruise/bleed easily.  Psychiatric/Behavioral:  Positive for depression and sleep disturbance. The patient is nervous/anxious.       PAST MEDICAL/SURGICAL HISTORY:  Past Medical History:  Diagnosis Date   Acute encephalopathy 12/29/2017   Hospitalized 5/22 to 5/23 with acute encephalopathy, unclear etiology   Acute gout involving toe of right foot 09/14/2019   Alcoholism ([place])    Allergy    Anxiety    Arthritis    Back problem    Cataract 01/2021   Chest wall pain 03/28/2017   Chronic hepatitis C without hepatic coma ([place]) 01/04/2009   Qualifier: Diagnosis of  By: Westly Pam. 2008: CT A/P NO CIRRHOSIS AUG 2013 MRCP- DILATED CBD/NO CIRRHOSIS DEC 2013 AST 99-102  ALT 145-156 PLT 147 HB 13.4 ALB 4.4-4.7 T BIL 0.7    Depression    Dilation of biliary tract 07/14/2012   EUS JAN 2014 BENIGN CBD DILATION    Epigastric pain 07/14/2012   GERD (gastroesophageal reflux disease)    Gout    HCV (hepatitis C virus) 06/16/2015   Hepatitis B    Hepatitis C 1979   Hepatitis C reactive    LIVER BX 2008-CHRONIC ACTIVE HEPATITIS   HTN (hypertension)    Hx of adenomatous  colonic polyps 2008   due for surveillance 2017   Hypercholesterolemia    IBS (irritable bowel syndrome)    Iron deficiency anemia 03/13/2022   Irritable bowel syndrome 01/04/2009   Qualifier: Diagnosis of  By: Westly Pam.    Knee pain    Left forearm pain 08/15/2017   Leukopenia 80/99/8338   Lichen planus    Narcotic addiction ([place]) 06/10/2011   [facility]- Oct 2012    Normal cardiac  stress test 05/25/2011   [facility]   Normal echocardiogram 05/25/2011   EF 55%, mild TR   Oral lichen planus    Plaque psoriasis    RUQ pain 07/14/2012   Status post dilation of esophageal narrowing    Substance abuse ([place])    narcotic addiction   Thrombocytopenia ([place]) 11/06/2015   Trigger middle finger of left hand 06/20/2012   Past Surgical History:  Procedure Laterality Date   24 HOUR [place] STUDY  10/21/2020   Procedure: 24 HOUR [place] STUDY;  Surgeon: Mauri Pole, MD;  [facility]: [facility];  Service: Endoscopy;;   APPENDECTOMY     BALLOON DILATION N/A 05/28/2020   Procedure: [place];  Surgeon: Eloise Harman, DO;  [facility];  Service: Endoscopy;  Laterality: N/A;   BIOPSY  05/28/2020   Procedure: BIOPSY;  Surgeon: Eloise Harman, DO;  [facility];  Service: Endoscopy;;   CARPAL TUNNEL RELEASE     rt   COLONOSCOPY  2008 [facility] ARS D100 V8 PHEN 12.5   2 SIMPLE ADENOMAS (< 1 CM)   COLONOSCOPY WITH PROPOFOL N/A 12/05/2019   TI normal, 3 mm polyp in ascending colon, external and internal hemorrhoids. Polyp removed but not retrieved. Poor prep   ESOPHAGEAL MANOMETRY N/A 10/21/2020   Procedure: ESOPHAGEAL MANOMETRY (EM);  Surgeon: Mauri Pole, MD;  [facility]: [facility];  Service: Endoscopy;  Laterality: N/A;   ESOPHAGOGASTRODUODENOSCOPY  04/26/2012   Dr. Oneida Alar: Non-erosive gastritis (inflammation) was found in the gastric antrum but no H.pylori; multiple biopsies (duodenal bx negative for Celiac)/ The mucosa of the esophagus appeared normal   ESOPHAGOGASTRODUODENOSCOPY  07/28/2021   Reports that his health; normal exam s/p esophageal dilation, esophageal biopsies obtained and were benign.   ESOPHAGOGASTRODUODENOSCOPY (EGD) WITH PROPOFOL N/A 12/05/2019   mild gastritis, duodenitis due to aspirin/NSAIDs.    ESOPHAGOGASTRODUODENOSCOPY (EGD) WITH PROPOFOL N/A 05/28/2020   Procedure: ESOPHAGOGASTRODUODENOSCOPY (EGD)  WITH PROPOFOL;  Surgeon: Eloise Harman, DO;  [facility];  Service: Endoscopy;  Laterality: N/A;  2:30pm   EUS  09/08/2012   Dr. Ardis Hughs: CBD dilated but no stones. Query secondary to Sphincter of Oddi stenosis, ?dysfunction, but clinically without symptoms   FRACTURE SURGERY N/A    [employer] 08/25/2020   HARDWARE REMOVAL Right 02/12/2014   Procedure: HARDWARE REMOVAL;  Surgeon: Jolyn Nap, MD;  Location: [facility];  Service: Orthopedics;  Laterality: Right;   KNEE ARTHROSCOPY     Neurostimulator implant     POLYPECTOMY  12/05/2019   Procedure: POLYPECTOMY;  Surgeon: Danie Binder, MD;  [facility];  Service: Endoscopy;;   right ring finger     spinal stenosis, had screws put in neck  04/11/2009   DR Windber Right 02/12/2014   Procedure: RIGHT ULNAR SHORTENING AND OSTEOTOMY ;  Surgeon: Jolyn Nap, MD;  Location: [facility];  Service: Orthopedics;  Laterality: Right;   ULNAR NERVE TRANSPOSITION     UPPER GASTROINTESTINAL ENDOSCOPY  2008 SLF ABD PAIN WEIGHT LOSS d100 v8 phen 12.5   NL   WRIST SURGERY Right 08/2013   Dr. Edmonia Lynch     SOCIAL HISTORY:  Social History   Socioeconomic History   Marital status: Divorced    Spouse name: Not on file   Number of children: 1   Years of education: Not on file   Highest education level: Not on file  Occupational History   Occupation: Disabled/retired  Tobacco Use   Smoking status: Former    Packs/day: 1.50    Years: 8.00    Total pack years: 12.00    Types: Cigarettes    Quit date: 08/13/1986    Years since quitting: 35.6    Passive exposure: Current   Smokeless tobacco: Never   Tobacco comments:    quit 20 + yrs ago  Vaping Use   Vaping Use: Never used  Substance and Sexual Activity   Alcohol use: Not Currently    Comment: Used to drink heavily- went to treatment in May 2020. No alcohol in 3 years  (03/13/2022)   Drug use: Not Currently    Comment: pain medications, klonopin- none in the last 17 months. Currently in [place]   Sexual activity: Never  Other Topics Concern   Not on file  Social History Narrative   Not on file   Social Determinants of Health   Financial Resource Strain: Low Risk  (03/09/2022)   Overall Financial Resource Strain (CARDIA)    Difficulty of Paying Living Expenses: Not hard at all  Food Insecurity: No Food Insecurity (03/09/2022)   Hunger Vital Sign    Worried About Running Out of Food in the Last Year: Never true    Ran Out of Food in the Last Year: Never true  Transportation Needs: No Transportation Needs (03/09/2022)   PRAPARE - Hydrologist (Medical): No    Lack of Transportation (Non-Medical): No  Physical Activity: Sufficiently Active (03/09/2022)   Exercise Vital Sign    Days of Exercise per Week: 7 days    Minutes of Exercise per Session: 150+ min  Stress: Stress Concern Present (03/09/2022)   [place]    Feeling of Stress : To some extent  Social Connections: Socially Isolated (03/09/2022)   Social Connection and Isolation Panel [NHANES]    Frequency of Communication with Friends and Family: More than three times a week    Frequency of Social Gatherings with Friends and Family: More than three times a week    Attends Religious Services: Never    Marine scientist or Organizations: No    Attends Archivist Meetings: Never    Marital Status: Divorced  Human resources officer Violence: Not At Risk (03/09/2022)   Humiliation, Afraid, Rape, and Kick questionnaire    Fear of Current or Ex-Partner: No    Emotionally Abused: No    Physically Abused: No    Sexually Abused: No    FAMILY HISTORY:  Family History  Problem Relation Age of Onset   Bladder Cancer Mother        rare form    Cancer Sister        Metastatic abdominal   Emotional abuse  Sister    Stroke Sister    Emotional abuse Sister    Heart failure Father  Stroke Father    Alcohol abuse Father    Dementia Paternal Aunt    Alcohol abuse Paternal Uncle    Dementia Paternal Uncle    Dementia Paternal Grandmother    Stroke Paternal Grandmother    ADD / ADHD Cousin    Bipolar disorder Cousin    Anxiety disorder Cousin    Depression Cousin        Committed suicide   Alcohol abuse Cousin    OCD Cousin    Paranoid behavior Cousin    Brain cancer Maternal Grandfather    Heart defect Other        FAMILY HX   Colon cancer Maternal Uncle    Colon cancer Cousin    Colon polyps Neg Hx    Drug abuse Neg Hx    Schizophrenia Neg Hx    Seizures Neg Hx    Sexual abuse Neg Hx    Physical abuse Neg Hx     CURRENT MEDICATIONS:  Outpatient Encounter Medications as of 04/09/2022  Medication Sig   AMBULATORY NON FORMULARY MEDICATION Kratom Take 2 tablet by mouth twice daily   AMBULATORY NON FORMULARY MEDICATION Friska caffiene 1-2 tablet twice daily   amLODipine (NORVASC) 2.5 MG tablet Take 1 tablet (2.5 mg total) by mouth daily.   Ascorbic Acid (VITAMIN C WITH ROSE HIPS) 500 MG tablet Take 500 mg by mouth daily.    aspirin EC 81 MG tablet Take 81 mg by mouth daily.   baclofen (LIORESAL) 10 MG tablet TAKE (1/2) TABLET (5MG) BY MOUTH TWICE DAILY BEFORE A MEAL.   famotidine (PEPCID) 20 MG tablet TAKE 1 TABLET BY MOUTH TWICE DAILY. TAKE WITH BREAKFAST AND AT BEDTIME.   ibuprofen (ADVIL) 200 MG tablet Take 400 mg by mouth 2 (two) times daily.   lansoprazole (PREVACID) 30 MG capsule Take 1 capsule (30 mg total) by mouth 2 (two) times daily before a meal.   lubiprostone (AMITIZA) 24 MCG capsule TAKE 1 CAPSULE TWICE DAILY WITH MEALS.   Multiple Vitamin (MULTIVITAMIN WITH MINERALS) TABS tablet Take 1 tablet by mouth daily.   Nutritional Supplements (KETO PO) Take 1 tablet by mouth in the morning, at noon, and at bedtime.    pilocarpine (SALAGEN) 5 MG tablet Take 5 mg by mouth  in the morning, at noon, in the evening, and at bedtime.   Probiotic Product (PROBIOTIC DAILY PO) Take by mouth daily.   RABEprazole (ACIPHEX) 20 MG tablet Take 20 mg by mouth daily.   senna (SENOKOT) 8.6 MG tablet Take 2 tablets by mouth 2 (two) times daily.   Turmeric 400 MG CAPS Take 800 mg by mouth daily.    Vitamin D3 (VITAMIN D) 25 MCG tablet Take 4,000 Units by mouth daily.    No facility-administered encounter medications on file as of 04/09/2022.    ALLERGIES:  Allergies  Allergen Reactions   Levofloxacin Nausea Only and Other (See Comments)    "Creepy" feeling, skin didn't feel right, sort of itchy.   Ciprofloxacin     Sweating   Sulfa Antibiotics      PHYSICAL EXAM:    ECOG PERFORMANCE STATUS: 1 - Symptomatic but completely ambulatory  There were no vitals filed for this visit. There were no vitals filed for this visit. Physical Exam Constitutional:      Appearance: Normal appearance.  HENT:     Head: Normocephalic and atraumatic.     Mouth/Throat:     Mouth: Mucous membranes are moist.  Eyes:  Extraocular Movements: Extraocular movements intact.     Pupils: Pupils are equal, round, and reactive to light.  Cardiovascular:     Rate and Rhythm: Regular rhythm. Bradycardia present.     Pulses: Normal pulses.     Heart sounds: Normal heart sounds.  Pulmonary:     Effort: Pulmonary effort is normal.     Breath sounds: Normal breath sounds.  Abdominal:     General: Bowel sounds are normal.     Palpations: Abdomen is soft.     Tenderness: There is no abdominal tenderness.  Musculoskeletal:        General: No swelling.     Right lower leg: No edema.     Left lower leg: No edema.  Lymphadenopathy:     Cervical: No cervical adenopathy.  Skin:    General: Skin is warm and dry.  Neurological:     General: No focal deficit present.     Mental Status: He is alert and oriented to person, place, and time.  Psychiatric:        Mood and Affect: Mood normal.         Behavior: Behavior normal.      LABORATORY DATA:  I have reviewed the labs as listed.  CBC    Component Value Date/Time   WBC 5.0 03/12/2022 1010   RBC 4.21 (L) 03/12/2022 1010   HGB 12.7 (L) 03/12/2022 1010   HGB 13.7 10/15/2020 1021   HCT 38.3 (L) 03/12/2022 1010   HCT 39.2 10/15/2020 1021   PLT 140 (L) 03/12/2022 1010   PLT 140 (L) 10/15/2020 1021   MCV 91.0 03/12/2022 1010   MCV 92 10/15/2020 1021   MCH 30.2 03/12/2022 1010   MCHC 33.2 03/12/2022 1010   RDW 13.6 03/12/2022 1010   RDW 12.0 10/15/2020 1021   LYMPHSABS 1.1 03/12/2022 1010   LYMPHSABS 1.3 10/15/2020 1021   MONOABS 0.3 03/12/2022 1010   EOSABS 0.1 03/12/2022 1010   EOSABS 0.2 10/15/2020 1021   BASOSABS 0.0 03/12/2022 1010   BASOSABS 0.0 10/15/2020 1021      Latest Ref Rng & Units 03/12/2022   10:10 AM 02/05/2022    9:07 AM 06/20/2021    9:22 AM  CMP  Glucose 70 - 99 mg/dL 117  103    BUN 8 - 23 mg/dL 28  26    Creatinine 0.61 - 1.24 mg/dL 0.76  0.88    Sodium 135 - 145 mmol/L 139  140    Potassium 3.5 - 5.1 mmol/L 4.2  4.0    Chloride 98 - 111 mmol/L 108  106    CO2 22 - 32 mmol/L 27  28    Calcium 8.9 - 10.3 mg/dL 9.3  9.6    Total Protein 6.5 - 8.1 g/dL 7.0  6.9    6.8  6.2   Total Bilirubin 0.3 - 1.2 mg/dL 0.5  0.3  0.2   Alkaline Phos 38 - 126 U/L 63   69   AST 15 - 41 U/L 45  57  33   ALT 0 - 44 U/L 46  65  33     DIAGNOSTIC IMAGING:  I have independently reviewed the relevant imaging and discussed with the patient.  ASSESSMENT & PLAN: 1.  Abnormal SPEP/immunofixation - Patient seen at the request of his rheumatologist (Dr. Estanislado Pandy) for evaluation of abnormal immunofixation. - Labs from rheumatology: Immunofixation from 02/05/2022 showed poorly defined area of restricted protein mobility reactive with lambda light chain antisera.  Quantitative immunoglobulins were normal but on the lower limits of normal values.   SPEP negative for M spike, but was consistent with  hypogammaglobulinemia (gamma globulin 0.7). - Hematology work-up (03/12/2022) NEGATIVE for any plasma cell dyscrasia SPEP unremarkable. Immunofixation negative. Mildly elevated kappa free light chain 24.2 with normal lambda 12.1 and mildly elevated ratio 2.0. CBC shows mild normocytic anemia with Hgb 12.7/MCV 91.0 CMP with normal creatinine 0.76.  (Transaminitis chronic/at baseline in the setting of hepatitis C and liver disease following with Dr. Abbey Chatters) Mildly elevated LDH 193.  Normal beta-2 microglobulin.  - Constellation of symptoms (also seeing rheumatology) including chronic pain syndrome, chills, night sweats, tinnitus, intermittent hearing loss, positional dizziness, and nondiabetic peripheral neuropathy.   - No new bone pain, unexplained fevers, or unintentional weight loss.  - PLAN: We will repeat MGUS/myeloma panel in 1 year to rule out any developing plasma cell dyscrasia.   2.  Thrombocytopenia and leukopenia - Previously seen at [facility] from 2017-2018 due to leukopenia and thrombocytopenia in the setting of hepatitis C and fatty liver disease, as well as elevated ferritin/iron saturation. - CBC (03/12/2022): WBC 5.0, Hgb 12.7/MCV 91.0, platelets 140, normal differential - PLAN: We will repeat CBC in 4 months.   3.  Iron deficiency anemia - Patient has history of elevated ferritin and iron saturation in the setting of liver disease, but most recent labs consistent with iron deficiency anemia. - Colonoscopy (12/05/2019): Limited by poor prep.  Polyp x1, external and internal hemorrhoids - EGD (12/05/2019): Mild gastritis and duodenitis secondary to aspirin and NSAIDs - Denies bright red blood per rectum or melena - Takes ibuprofen twice daily. - Has been taking iron tablet once daily since August 2023. - Admits to chronic fatigue.  Denies pica. - Labs (03/12/2022) with Hgb 12.7/MCV 91.0, ferritin 12, iron saturation 10% - PLAN: Check stool cards x3. - Continue oral  iron with ferrous sulfate 325 mg daily  - Advised to STOP all NSAID medications and reach out to GI if he continues to have abdominal cramping pain - We will repeat CBC and iron panel with RTC in 4 months.  4.  Other history - PMH: Osteoarthritis, chronic pain syndrome, history of hepatitis C and transaminitis (follows with Dr. [facility] at [facility]), GERD, recovering alcoholism in remission (quit drinking 2020), hypertension, prediabetes, history of colon polyps, anxiety/depression, vitamin D deficiency.  He follows with rheumatology due to positive ANA, sicca symptoms, constitutional symptoms, and arthritic pain (recently referred to [facility] for second opinion). - SOCIAL:  Lives alone and is functionally independent.  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 - SUBSTANCE: Past history of substance abuse, but has been sober for the past 3 years.  He reports prior history of daily heavy alcohol use on and off for about 45 years.  He reports previous heavy drug use including IV heroin, cocaine, MDMA, and opioid/benzodiazepine pills.  He worked very hard via Alcoholics Anonymous over the past 3 years. - FAMILY: No family history of multiple myeloma.  Mother deceased secondary to urachal bladder cancer.  Sister deceased secondary to metastatic stomach cancer.  Maternal grandmother with unspecified cancer.  Maternal grandfather with brain cancer.    PLAN SUMMARY & DISPOSITION: Labs in 4 months (CBC and iron panel + B12/MMA + folate + copper) RTC 1 week after labs  All questions were answered. The patient knows to call the clinic with any problems, questions or concerns.  Medical decision making: Moderate  Time spent on visit: I spent 20 minutes counseling the patient face to face. The total time spent in the appointment was 30 minutes and more than 50% was on counseling.   ELIBERTO SOLE, PA-C  04/09/2022 11:21 AM

## 2022-04-09 ENCOUNTER — Inpatient Hospital Stay: Payer: Medicare HMO | Admitting: Physician Assistant

## 2022-04-09 VITALS — BP 124/66 | HR 86 | Temp 97.4°F | Resp 18 | Ht 69.49 in | Wt 137.9 lb

## 2022-04-09 DIAGNOSIS — D696 Thrombocytopenia, unspecified: Secondary | ICD-10-CM | POA: Diagnosis not present

## 2022-04-09 DIAGNOSIS — B182 Chronic viral hepatitis C: Secondary | ICD-10-CM | POA: Diagnosis not present

## 2022-04-09 DIAGNOSIS — D509 Iron deficiency anemia, unspecified: Secondary | ICD-10-CM | POA: Diagnosis not present

## 2022-04-09 DIAGNOSIS — E559 Vitamin D deficiency, unspecified: Secondary | ICD-10-CM | POA: Diagnosis not present

## 2022-04-09 DIAGNOSIS — F1011 Alcohol abuse, in remission: Secondary | ICD-10-CM | POA: Diagnosis not present

## 2022-04-09 DIAGNOSIS — R769 Abnormal immunological finding in serum, unspecified: Secondary | ICD-10-CM | POA: Diagnosis not present

## 2022-04-09 DIAGNOSIS — R778 Other specified abnormalities of plasma proteins: Secondary | ICD-10-CM | POA: Diagnosis not present

## 2022-04-09 DIAGNOSIS — I10 Essential (primary) hypertension: Secondary | ICD-10-CM | POA: Diagnosis not present

## 2022-04-09 DIAGNOSIS — D72819 Decreased white blood cell count, unspecified: Secondary | ICD-10-CM | POA: Diagnosis not present

## 2022-04-09 DIAGNOSIS — D649 Anemia, unspecified: Secondary | ICD-10-CM

## 2022-04-09 DIAGNOSIS — K219 Gastro-esophageal reflux disease without esophagitis: Secondary | ICD-10-CM | POA: Diagnosis not present

## 2022-04-09 DIAGNOSIS — M1990 Unspecified osteoarthritis, unspecified site: Secondary | ICD-10-CM | POA: Diagnosis not present

## 2022-04-09 NOTE — Patient Instructions (Signed)
Marc [name] at [facility] **   You were seen today by Marc Abernethy PA-C for your follow-up visit.    IRON DEFICIENCY ANEMIA You have mild anemia as well as low iron. We will check stool cards to see if you have any blood in your bowel movements. You have previously had gastritis/duodenitis (irritation in your stomach and small intestine) from taking NSAID medications.  It is EXTREMELY IMPORTANT that you stop taking all ibuprofen or other NSAID medications at this time. If you continue to have stomach pain, please reach out to your GI doctor (Dr. Abbey [name] and Marc Altes, PA-C) for further advice. Continue to take iron tablet) FERROUS SULFATE) once daily. We will recheck your blood counts and iron levels in 4 months.  LOW PLATELETS & LOW WHITE BLOOD CELLS This is related to your underlying liver disease. Your white blood cells and platelets are only mildly low at this time. You do not need any treatment for this, but we will continue to monitor.  ABNORMAL IMMUNOFIXATION You were sent to [name] by your rheumatologist due to "abnormal immunofixation labs."  When we recheck these labs we did not see any abnormality or sign of conditions such as MGUS or multiple myeloma. We will recheck these labs in 1 year.   FOLLOW-UP APPOINTMENT: Labs in 4 months with office visit 1 week after  ** Thank you for trusting me with your healthcare!  I strive to provide all of my patients with quality care at each visit.  If you receive a survey for this visit, I would be so grateful to you for taking the time to provide feedback.  Thank you in advance!  ~ Marc [name]                   Dr. Derek [name]   &   Marc Abernethy, PA-C   - - - - - - - - - - - - - - - - - -    Thank you for choosing [facility] to provide your oncology and hematology care.  To afford each patient quality time with our  provider, please arrive at least 15 minutes before your scheduled appointment time.   If you have a lab appointment with the [facility] please come in thru the Main Entrance and check in at the main information desk.  You need to re-schedule your appointment should you arrive 10 or more minutes late.  We strive to give you quality time with our providers, and arriving late affects you and other patients whose appointments are after yours.  Also, if you no show three or more times for appointments you may be dismissed from the clinic at the providers discretion.     Again, thank you for choosing [facility].  Our hope is that these requests will decrease the amount of time that you wait before being seen by our physicians.       _____________________________________________________________  Should you have questions after your visit to [facility], please contact our office at [phone] and follow the prompts.  Our office hours are 8:00 a.m. and 4:30 p.m. Monday - Friday.  Please note that voicemails left after 4:00 p.m. may not be returned until the following business day.  We are closed weekends and major holidays.  You do have access to a nurse 24-7, just call the main number to the clinic  [phone] and do not press any options, hold on the line and a nurse will answer the phone.    For prescription refill requests, have your pharmacy contact our office and allow 72 hours.

## 2022-04-10 ENCOUNTER — Telehealth: Payer: Self-pay

## 2022-04-10 NOTE — Telephone Encounter (Signed)
Noted  

## 2022-04-10 NOTE — Telephone Encounter (Signed)
Pt called and lmom requesting a return call from the nurse.

## 2022-04-10 NOTE — Telephone Encounter (Signed)
Spoke to pt, informed him of results and recommendations. Pt voiced understanding. Pt had questions on fatty liver.  Mailed information.

## 2022-04-14 ENCOUNTER — Other Ambulatory Visit: Payer: Self-pay

## 2022-04-14 ENCOUNTER — Other Ambulatory Visit (HOSPITAL_COMMUNITY)
Admission: RE | Admit: 2022-04-14 | Discharge: 2022-04-14 | Disposition: A | Discharge status: Home / Self Care | Payer: Medicare HMO | Attending: Hematology | Admitting: Hematology

## 2022-04-14 DIAGNOSIS — D509 Iron deficiency anemia, unspecified: Secondary | ICD-10-CM | POA: Diagnosis not present

## 2022-04-14 LAB — OCCULT BLOOD X 1 CARD TO LAB, STOOL
Fecal Occult Bld: POSITIVE — AB
Fecal Occult Bld: POSITIVE — AB
Fecal Occult Bld: POSITIVE — AB

## 2022-04-30 ENCOUNTER — Telehealth: Payer: Self-pay | Admitting: Gastroenterology

## 2022-04-30 NOTE — Telephone Encounter (Signed)
Received communication from [employer], PA-C that this patient has IDA and has been heme positive x 3 this month without overt GI bleeding. IDA was discussed at his last OV and recommended updating colonoscopy, but patient declined. He is currently scheduled for follow-up with Magda Paganini on 06/19/22. Can we see about moving his appointment up for him to be seen in the next 2-4 weeks if he is willing?

## 2022-04-30 NOTE — Telephone Encounter (Signed)
[name], please call patient and let him know IDA stands for iron deficiency anemia meaning his iron is low and his hemoglobin is low. We discussed this at his last OV. And yes, the stool samples he recently sent in were positive for blood.

## 2022-04-30 NOTE — Telephone Encounter (Signed)
Spoke to pt, informed him of what IDA was and that his stool sample was positive for blood. Pt voiced understanding. Informed of OV on 05/18/2022.

## 2022-05-04 ENCOUNTER — Other Ambulatory Visit: Payer: Self-pay | Admitting: Internal Medicine

## 2022-05-20 ENCOUNTER — Other Ambulatory Visit: Payer: Self-pay | Admitting: Internal Medicine

## 2022-05-20 DIAGNOSIS — K219 Gastro-esophageal reflux disease without esophagitis: Secondary | ICD-10-CM

## 2022-05-21 DIAGNOSIS — G629 Polyneuropathy, unspecified: Secondary | ICD-10-CM | POA: Diagnosis not present

## 2022-05-21 DIAGNOSIS — R682 Dry mouth, unspecified: Secondary | ICD-10-CM | POA: Diagnosis not present

## 2022-05-21 NOTE — Telephone Encounter (Signed)
Received refill request for baclofen, but states this was discontinued on 04/09/22 by an LPN. Not sure if this was intentional. Please ask patient if he is still taking baclofen twice daily.

## 2022-05-22 NOTE — Telephone Encounter (Signed)
Spoke to pt, he informed me that he is still taking baclofen twice daily

## 2022-05-22 NOTE — Telephone Encounter (Signed)
Rx refilled.

## 2022-05-26 NOTE — Progress Notes (Unsigned)
GI Office Note    Referring Provider: Fayrene Helper, MD Primary Care Physician:  Fayrene Helper, MD  Primary Gastroenterologist: Elon Alas. Abbey Chatters, DO   Chief Complaint   No chief complaint on file.   History of Present Illness   Marc [name] is a 66 y.o. male presenting today for further evaluation of IDA, heme + stool. H/o elevated LFTs, GERD, dysphagia followed at [facility], constipation, colon polyps, hemorrhoids, and hep C genotype 1a, elastography F3/F4 s/p treatment in the past with Harvoni and documented SVR. Post treatment elastography with kPa 6.2 in February 2021.Elastography repeated in 03/2022 with median kPa of 5.4. Last colonoscopy in April 2021 with poor prep, polyp removed but not retrieved.  Recommended repeat colonoscopy in 3 months.  This has yet to be completed.  EGD in October 2021 for dysphagia with benign-appearing esophageal stenosis s/p dilation, gastritis s/p biopsy, normal examined duodenum.  Pathology negative H. Pylori.   ***labs       Medications   Current Outpatient Medications  Medication Sig Dispense Refill   AMBULATORY NON FORMULARY MEDICATION Kratom Take 2 tablet by mouth twice daily     AMBULATORY NON FORMULARY MEDICATION Friska caffiene 1-2 tablet twice daily     amLODipine (NORVASC) 2.5 MG tablet Take 1 tablet (2.5 mg total) by mouth daily. 30 tablet 0   Ascorbic Acid (VITAMIN C WITH ROSE HIPS) 500 MG tablet Take 500 mg by mouth daily.      aspirin EC 81 MG tablet Take 81 mg by mouth daily.     baclofen (LIORESAL) 10 MG tablet TAKE (1/2) TABLET ('5MG'$ ) BY MOUTH TWICE DAILY BEFORE A MEAL. 30 tablet 3   famotidine (PEPCID) 20 MG tablet TAKE 1 TABLET BY MOUTH TWICE DAILY. TAKE WITH BREAKFAST AND AT BEDTIME. 60 tablet 11   ibuprofen (ADVIL) 200 MG tablet Take 400 mg by mouth 2 (two) times daily.     lansoprazole (PREVACID) 30 MG capsule Take 1 capsule (30 mg total) by mouth 2 (two) times daily before a meal. 60 capsule 5    lubiprostone (AMITIZA) 24 MCG capsule TAKE 1 CAPSULE TWICE DAILY WITH MEALS. 60 capsule 5   Multiple Vitamin (MULTIVITAMIN WITH MINERALS) TABS tablet Take 1 tablet by mouth daily.     Nutritional Supplements (KETO PO) Take 1 tablet by mouth in the morning, at noon, and at bedtime.      nystatin (MYCOSTATIN) 100000 UNIT/ML suspension SWISH AND SWALLOW 5ML BY MOUTH FOUR TIMES DAILY. 60 mL 0   pilocarpine (SALAGEN) 5 MG tablet Take 5 mg by mouth in the morning, at noon, in the evening, and at bedtime.     Probiotic Product (PROBIOTIC DAILY PO) Take by mouth daily.     RABEprazole (ACIPHEX) 20 MG tablet Take 20 mg by mouth daily.     senna (SENOKOT) 8.6 MG tablet Take 2 tablets by mouth 2 (two) times daily.     Turmeric 400 MG CAPS Take 800 mg by mouth daily.      Vitamin D3 (VITAMIN D) 25 MCG tablet Take 4,000 Units by mouth daily.      No current facility-administered medications for this visit.    Allergies   Allergies as of 05/27/2022 - Review Complete 04/09/2022  Allergen Reaction Noted   Levofloxacin Nausea Only and Other (See Comments)    Ciprofloxacin  06/13/2015   Sulfa antibiotics  12/02/2012     Past Medical History   Past Medical History:  Diagnosis Date  Acute encephalopathy 12/29/2017   Hospitalized 5/22 to 5/23 with acute encephalopathy, unclear etiology   Acute gout involving toe of right foot 09/14/2019   Alcoholism ([place])    Allergy    Anxiety    Arthritis    Back problem    Cataract 01/2021   Chest wall pain 03/28/2017   Chronic hepatitis C without hepatic coma ([place]) 01/04/2009   Qualifier: Diagnosis of  By: Westly Pam. 2008: CT A/P NO CIRRHOSIS AUG 2013 MRCP- DILATED CBD/NO CIRRHOSIS DEC 2013 AST 99-102  ALT 145-156 PLT 147 HB 13.4 ALB 4.4-4.7 T BIL 0.7    Depression    Dilation of biliary tract 07/14/2012   EUS JAN 2014 BENIGN CBD DILATION    Epigastric pain 07/14/2012   GERD (gastroesophageal reflux disease)    Gout    HCV (hepatitis C virus)  06/16/2015   Hepatitis B    Hepatitis C 1979   Hepatitis C reactive    LIVER BX 2008-CHRONIC ACTIVE HEPATITIS   HTN (hypertension)    Hx of adenomatous colonic polyps 2008   due for surveillance 2017   Hypercholesterolemia    IBS (irritable bowel syndrome)    Iron deficiency anemia 03/13/2022   Irritable bowel syndrome 01/04/2009   Qualifier: Diagnosis of  By: Westly Pam.    Knee pain    Left forearm pain 08/15/2017   Leukopenia 95/63/8756   Lichen planus    Narcotic addiction ([place]) 06/10/2011   [facility]- Oct 2012    Normal cardiac stress test 05/25/2011   [facility] [place]   Normal echocardiogram 05/25/2011   EF 55%, mild TR   Oral lichen planus    Plaque psoriasis    RUQ pain 07/14/2012   Status post dilation of esophageal narrowing    Substance abuse ([place])    narcotic addiction   Thrombocytopenia ([place]) 11/06/2015   Trigger middle finger of left hand 06/20/2012    Past Surgical History   Past Surgical History:  Procedure Laterality Date   24 HOUR [place] STUDY  10/21/2020   Procedure: 24 HOUR [place] STUDY;  Surgeon: Mauri Pole, MD;  Location: [facility];  Service: Endoscopy;;   APPENDECTOMY     BALLOON DILATION N/A 05/28/2020   Procedure: [place];  Surgeon: Eloise Harman, DO;  [facility];  Service: Endoscopy;  Laterality: N/A;   BIOPSY  05/28/2020   Procedure: BIOPSY;  Surgeon: Eloise Harman, DO;  [facility];  Service: Endoscopy;;   CARPAL TUNNEL RELEASE     rt   COLONOSCOPY  2008 [facility] ARS D100 V8 PHEN 12.5   2 SIMPLE ADENOMAS (< 1 CM)   COLONOSCOPY WITH PROPOFOL N/A 12/05/2019   TI normal, 3 mm polyp in ascending colon, external and internal hemorrhoids. Polyp removed but not retrieved. Poor prep   ESOPHAGEAL MANOMETRY N/A 10/21/2020   Procedure: ESOPHAGEAL MANOMETRY (EM);  Surgeon: Mauri Pole, MD;  Location: [facility];  Service: Endoscopy;  Laterality: N/A;    ESOPHAGOGASTRODUODENOSCOPY  04/26/2012   Dr. Oneida Alar: Non-erosive gastritis (inflammation) was found in the gastric antrum but no H.pylori; multiple biopsies (duodenal bx negative for Celiac)/ The mucosa of the esophagus appeared normal   ESOPHAGOGASTRODUODENOSCOPY  07/28/2021   Reports that his health; normal exam s/p esophageal dilation, esophageal biopsies obtained and were benign.   ESOPHAGOGASTRODUODENOSCOPY (EGD) WITH PROPOFOL N/A 12/05/2019   mild gastritis, duodenitis due to aspirin/NSAIDs.    ESOPHAGOGASTRODUODENOSCOPY (EGD) WITH PROPOFOL  N/A 05/28/2020   Procedure: ESOPHAGOGASTRODUODENOSCOPY (EGD) WITH PROPOFOL;  Surgeon: Eloise Harman, DO;  [facility];  Service: Endoscopy;  Laterality: N/A;  2:30pm   EUS  09/08/2012   Dr. Ardis Hughs: CBD dilated but no stones. Query secondary to Sphincter of Oddi stenosis, ?dysfunction, but clinically without symptoms   FRACTURE SURGERY N/A    [employer]rees[employer] 08/25/2020   HARDWARE REMOVAL Right 02/12/2014   Procedure: HARDWARE REMOVAL;  Surgeon: Jolyn Nap, MD;  Location: [facility];  Service: Orthopedics;  Laterality: Right;   KNEE ARTHROSCOPY     Neurostimulator implant     POLYPECTOMY  12/05/2019   Procedure: POLYPECTOMY;  Surgeon: Danie Binder, MD;  [facility];  Service: Endoscopy;;   right ring finger     spinal stenosis, had screws put in neck  04/11/2009   DR Anta[place] Right 02/12/2014   Procedure: RIGHT ULNAR SHORTENING AND OSTEOTOMY ;  Surgeon: Jolyn Nap, MD;  Location: [facility];  Service: Orthopedics;  Laterality: Right;   ULNAR NERVE TRANSPOSITION     UPPER GASTROINTESTINAL ENDOSCOPY  2008 SLF ABD PAIN WEIGHT LOSS d100 v8 phen 12.5   NL   WRIST SURGERY Right 08/2013   Dr. Edmonia Lynch    Past Family History   Family History  Problem Relation Age of Onset   Bladder Cancer Mother        rare form    Cancer  Sister        Metastatic abdominal   Emotional abuse Sister    Stroke Sister    Emotional abuse Sister    Heart failure Father    Stroke Father    Alcohol abuse Father    Dementia Paternal Aunt    Alcohol abuse Paternal Uncle    Dementia Paternal Uncle    Dementia Paternal Grandmother    Stroke Paternal Grandmother    ADD / ADHD Cousin    Bipolar disorder Cousin    Anxiety disorder Cousin    Depression Cousin        Committed suicide   Alcohol abuse Cousin    OCD Cousin    Paranoid behavior Cousin    Brain cancer Maternal Grandfather    Heart defect Other        FAMILY HX   Colon cancer Maternal Uncle    Colon cancer Cousin    Colon polyps Neg Hx    Drug abuse Neg Hx    Schizophrenia Neg Hx    Seizures Neg Hx    Sexual abuse Neg Hx    Physical abuse Neg Hx     Past Social History   Social History   Socioeconomic History   Marital status: Divorced    Spouse name: Not on file   Number of children: 1   Years of education: Not on file   Highest education level: Not on file  Occupational History   Occupation: Disabled/retired  Tobacco Use   Smoking status: Former    Packs/day: 1.50    Years: 8.00    Total pack years: 12.00    Types: Cigarettes    Quit date: 08/13/1986    Years since quitting: 35.8    Passive exposure: Current   Smokeless tobacco: Never   Tobacco comments:    quit 20 + yrs ago  Vaping Use   Vaping Use: Never used  Substance and Sexual Activity  Alcohol use: Not Currently    Comment: Used to drink heavily- went to treatment in May 2020. No alcohol in 3 years (03/13/2022)   Drug use: Not Currently    Comment: pain medications, klonopin- none in the last 17 months. Currently in [place]   Sexual activity: Never  Other Topics Concern   Not on file  Social History Narrative   Not on file   Social Determinants of Health   Financial Resource Strain: Low Risk  (03/09/2022)   Overall Financial Resource Strain (CARDIA)    Difficulty of Paying Living  Expenses: Not hard at all  Food Insecurity: No Food Insecurity (03/09/2022)   Hunger Vital Sign    Worried About Running Out of Food in the Last Year: Never true    Ran Out of Food in the Last Year: Never true  Transportation Needs: No Transportation Needs (03/09/2022)   PRAPARE - Hydrologist (Medical): No    Lack of Transportation (Non-Medical): No  Physical Activity: Sufficiently Active (03/09/2022)   Exercise Vital Sign    Days of Exercise per Week: 7 days    Minutes of Exercise per Session: 150+ min  Stress: Stress Concern Present (03/09/2022)   Marc [name]    Feeling of Stress : To some extent  Social Connections: Socially Isolated (03/09/2022)   Social Connection and Isolation Panel [NHANES]    Frequency of Communication with Friends and Family: More than three times a week    Frequency of Social Gatherings with Friends and Family: More than three times a week    Attends Religious Services: Never    Marine scientist or Organizations: No    Attends Archivist Meetings: Never    Marital Status: Divorced  Human resources officer Violence: Not At Risk (03/09/2022)   Humiliation, Afraid, Rape, and Kick questionnaire    Fear of Current or Ex-Partner: No    Emotionally Abused: No    Physically Abused: No    Sexually Abused: No    Review of Systems   General: Negative for anorexia, weight loss, fever, chills, fatigue, weakness. ENT: Negative for hoarseness, difficulty swallowing , nasal congestion. CV: Negative for chest pain, angina, palpitations, dyspnea on exertion, peripheral edema.  Respiratory: Negative for dyspnea at rest, dyspnea on exertion, cough, sputum, wheezing.  GI: See history of present illness. GU:  Negative for dysuria, hematuria, urinary incontinence, urinary frequency, nocturnal urination.  Endo: Negative for unusual weight change.     Physical Exam   There  were no vitals taken for this visit.   General: Well-nourished, well-developed in no acute distress.  Eyes: No icterus. Mouth: Oropharyngeal mucosa moist and pink , no lesions erythema or exudate. Lungs: Clear to auscultation bilaterally.  Heart: Regular rate and rhythm, no murmurs rubs or gallops.  Abdomen: Bowel sounds are normal, nontender, nondistended, no hepatosplenomegaly or masses,  no abdominal bruits or hernia , no rebound or guarding.  Rectal: ***  Extremities: No lower extremity edema. No clubbing or deformities. Neuro: Alert and oriented x 4   Skin: Warm and dry, no jaundice.   Psych: Alert and cooperative, normal mood and affect.  Labs   *** Imaging Studies   No results found.  Assessment       PLAN   ***   Laureen Ochs. Bobby Rumpf, [place], [facility]

## 2022-05-27 ENCOUNTER — Ambulatory Visit: Payer: Medicare HMO | Admitting: Gastroenterology

## 2022-05-27 ENCOUNTER — Encounter: Payer: Self-pay | Admitting: Gastroenterology

## 2022-05-27 ENCOUNTER — Telehealth: Payer: Self-pay | Admitting: *Deleted

## 2022-05-27 VITALS — BP 150/74 | HR 54 | Temp 97.6°F | Ht 69.0 in | Wt 140.6 lb

## 2022-05-27 DIAGNOSIS — R1310 Dysphagia, unspecified: Secondary | ICD-10-CM

## 2022-05-27 DIAGNOSIS — R195 Other fecal abnormalities: Secondary | ICD-10-CM | POA: Diagnosis not present

## 2022-05-27 DIAGNOSIS — R1011 Right upper quadrant pain: Secondary | ICD-10-CM | POA: Diagnosis not present

## 2022-05-27 DIAGNOSIS — K5900 Constipation, unspecified: Secondary | ICD-10-CM | POA: Diagnosis not present

## 2022-05-27 DIAGNOSIS — R7989 Other specified abnormal findings of blood chemistry: Secondary | ICD-10-CM

## 2022-05-27 DIAGNOSIS — K219 Gastro-esophageal reflux disease without esophagitis: Secondary | ICD-10-CM

## 2022-05-27 DIAGNOSIS — Z86010 Personal history of colonic polyps: Secondary | ICD-10-CM | POA: Diagnosis not present

## 2022-05-27 DIAGNOSIS — D509 Iron deficiency anemia, unspecified: Secondary | ICD-10-CM

## 2022-05-27 NOTE — Patient Instructions (Signed)
Please [facility] at [facility]  CT scan as scheduled. We will plan for a colonoscopy after your CT completed.  Agree with seeing neurology to rule out muscular disorders especially affecting your esophagus.

## 2022-05-27 NOTE — Telephone Encounter (Signed)
PA: Approved for CT via [facility]   DOS:05/27/22-06/26/22 Reference #: [id number]  Pt advised of appt on 05/29/22, arrive at 3:30 pm for a 4 pm procedure time, nothing to eat or drink 4 hours prior. Verbalized understanding.

## 2022-05-29 ENCOUNTER — Ambulatory Visit (HOSPITAL_COMMUNITY)
Admission: RE | Admit: 2022-05-29 | Discharge: 2022-05-29 | Disposition: A | Discharge status: Home / Self Care | Payer: Medicare HMO | Source: Ambulatory Visit | Attending: Gastroenterology | Admitting: Gastroenterology

## 2022-05-29 DIAGNOSIS — I700 Atherosclerosis of aorta: Secondary | ICD-10-CM | POA: Diagnosis not present

## 2022-05-29 DIAGNOSIS — R1310 Dysphagia, unspecified: Secondary | ICD-10-CM | POA: Diagnosis not present

## 2022-05-29 DIAGNOSIS — R7989 Other specified abnormal findings of blood chemistry: Secondary | ICD-10-CM | POA: Insufficient documentation

## 2022-05-29 DIAGNOSIS — K219 Gastro-esophageal reflux disease without esophagitis: Secondary | ICD-10-CM | POA: Diagnosis not present

## 2022-05-29 DIAGNOSIS — R195 Other fecal abnormalities: Secondary | ICD-10-CM | POA: Insufficient documentation

## 2022-05-29 DIAGNOSIS — K5900 Constipation, unspecified: Secondary | ICD-10-CM | POA: Insufficient documentation

## 2022-05-29 DIAGNOSIS — D509 Iron deficiency anemia, unspecified: Secondary | ICD-10-CM | POA: Diagnosis not present

## 2022-05-29 DIAGNOSIS — R1011 Right upper quadrant pain: Secondary | ICD-10-CM | POA: Diagnosis not present

## 2022-05-29 DIAGNOSIS — R109 Unspecified abdominal pain: Secondary | ICD-10-CM | POA: Diagnosis not present

## 2022-05-29 DIAGNOSIS — Z86010 Personal history of colonic polyps: Secondary | ICD-10-CM | POA: Insufficient documentation

## 2022-05-29 MED ORDER — IOHEXOL 300 MG/ML  SOLN
100.0000 mL | Freq: Once | INTRAMUSCULAR | Status: AC | PRN
  Administered 2022-05-29: 100 mL via INTRAVENOUS

## 2022-06-03 LAB — COMPREHENSIVE METABOLIC PANEL
AG Ratio: 1.8 (calc) (ref 1.0–2.5)
ALT: 38 U/L (ref 9–46)
AST: 39 U/L — ABNORMAL HIGH (ref 10–35)
Albumin: 4.3 g/dL (ref 3.6–5.1)
Alkaline phosphatase (APISO): 71 U/L (ref 35–144)
BUN/Creatinine Ratio: 31 (calc) — ABNORMAL HIGH (ref 6–22)
BUN: 26 mg/dL — ABNORMAL HIGH (ref 7–25)
CO2: 26 mmol/L (ref 20–32)
Calcium: 9.8 mg/dL (ref 8.6–10.3)
Chloride: 107 mmol/L (ref 98–110)
Creat: 0.85 mg/dL (ref 0.70–1.35)
Globulin: 2.4 g/dL (calc) (ref 1.9–3.7)
Glucose, Bld: 111 mg/dL — ABNORMAL HIGH (ref 65–99)
Potassium: 4.3 mmol/L (ref 3.5–5.3)
Sodium: 142 mmol/L (ref 135–146)
Total Bilirubin: 0.3 mg/dL (ref 0.2–1.2)
Total Protein: 6.7 g/dL (ref 6.1–8.1)

## 2022-06-03 LAB — CBC WITH DIFFERENTIAL/PLATELET
Absolute Monocytes: 432 cells/uL (ref 200–950)
Basophils Absolute: 32 cells/uL (ref 0–200)
Basophils Relative: 0.6 %
Eosinophils Absolute: 194 cells/uL (ref 15–500)
Eosinophils Relative: 3.6 %
HCT: 41.3 % (ref 38.5–50.0)
Hemoglobin: 14.2 g/dL (ref 13.2–17.1)
Lymphs Abs: 1021 cells/uL (ref 850–3900)
MCH: 31.3 pg (ref 27.0–33.0)
MCHC: 34.4 g/dL (ref 32.0–36.0)
MCV: 91.2 fL (ref 80.0–100.0)
MPV: 11.1 fL (ref 7.5–12.5)
Monocytes Relative: 8 %
Neutro Abs: 3721 cells/uL (ref 1500–7800)
Neutrophils Relative %: 68.9 %
Platelets: 146 10*3/uL (ref 140–400)
RBC: 4.53 10*6/uL (ref 4.20–5.80)
RDW: 12.2 % (ref 11.0–15.0)
Total Lymphocyte: 18.9 %
WBC: 5.4 10*3/uL (ref 3.8–10.8)

## 2022-06-03 LAB — ACETYLCHOLINE RECEPTOR, BINDING: A CHR BINDING ABS: <0.3 nmol/L

## 2022-06-03 LAB — ACETYLCHOLINE RECEPTOR, MODULATING: Acetylchol Modul Ab: <1 % Inhibition

## 2022-06-03 LAB — ACETYLCHOLINE RECEPTOR, BLOCKING: ACHR Blocking Abs: <15 % Inhibition (ref <15)

## 2022-06-03 LAB — LIPASE: Lipase: 99 U/L — ABNORMAL HIGH (ref 7–60)

## 2022-06-08 ENCOUNTER — Ambulatory Visit (INDEPENDENT_AMBULATORY_CARE_PROVIDER_SITE_OTHER): Payer: Medicare HMO | Admitting: Internal Medicine

## 2022-06-08 ENCOUNTER — Ambulatory Visit: Payer: Medicare HMO | Admitting: Gastroenterology

## 2022-06-08 ENCOUNTER — Encounter: Payer: Self-pay | Admitting: Internal Medicine

## 2022-06-08 VITALS — BP 118/62 | HR 43

## 2022-06-08 DIAGNOSIS — K117 Disturbances of salivary secretion: Secondary | ICD-10-CM | POA: Diagnosis not present

## 2022-06-08 NOTE — Progress Notes (Signed)
Acute Office Visit  Subjective:     Patient ID: Marc [name], male    DOB: 01-24-1956, 66 y.o.   MRN: [id number]  Chief Complaint  Patient presents with   Sinus Problem    Feels like drainage and notice tongue feels different almost like a sponge. Spitting up stuff. Also having pain on right side, similar to cramps. Started on 05/18/2022.   Marc [name] presents for an acute visit today to discuss sinus congestion and symptoms of dry mouth.  He reports a history of Sjogren syndrome, but notes that his mouth has been persistently dry despite usual treatment measures.  He specifically states that his tongue feels like a "sponge".  Marc [name] describes nasal congestion and he is producing white-colored mucus.  He denies fever/chills.  His chief concern is xerostomia, and he would like to discuss additional treatment options.  Review of Systems  HENT:  Positive for congestion.        Xerostomia  All other systems reviewed and are negative.     Objective:    BP 118/62   Pulse (!) 43   SpO2 99%   Physical Exam Vitals reviewed.  Constitutional:      General: He is not in acute distress.    Appearance: Normal appearance. He is not ill-appearing.  HENT:     Mouth/Throat:     Mouth: Mucous membranes are dry.     Pharynx: No oropharyngeal exudate or posterior oropharyngeal erythema.  Cardiovascular:     Rate and Rhythm: Normal rate and regular rhythm.     Pulses: Normal pulses.     Heart sounds: Normal heart sounds. No murmur heard.    No friction rub. No gallop.  Pulmonary:     Effort: Pulmonary effort is normal.     Breath sounds: Normal breath sounds.  Abdominal:     General: Abdomen is flat. Bowel sounds are normal.     Palpations: Abdomen is soft.     Tenderness: There is no abdominal tenderness.  Skin:    General: Skin is warm and dry.     Coloration: Skin is not jaundiced.  Neurological:     Mental Status: He is alert.       Assessment & Plan:   Problem List Items  Addressed This Visit       Xerostomia - Primary    Presenting today for an acute visit to discuss persistent symptoms of xerostomia.  He states that his tongue feels like a sponge.  This has occurred in the setting of nasal congestion with white sputum production.  No acute findings present on exam today. He has been diligently drinking water to keep his oral cavity moist.  He has also been using Biotene rinses for lubrication.  Marc [name] is additionally prescribed cevimeline 30 mg BID for treatment of xerostomia in the setting of Sjogren's disease. -I recommended that Marc [name] continue his current treatment measures to manage his symptoms.  In addition to treating xerostomia, I recommended that he use saline rinse, nasal spray, and daily antihistamine to treat his nasal congestion as this seems to have triggered an acute worsening of his symptoms.  I additionally recommended that Marc [name] take an additional dose of cevimeline 30 mg as the standard dose appears to be 3 times daily, whereas he is currently taking the medication twice daily. -He will return to care if his symptoms worsen or fail to improve.  Otherwise, he has routine follow-up scheduled with Dr. Moshe Cipro on  11/15.      Return if symptoms worsen or fail to improve.  Marc Abraham, MD

## 2022-06-14 ENCOUNTER — Encounter: Payer: Self-pay | Admitting: Internal Medicine

## 2022-06-14 DIAGNOSIS — K117 Disturbances of salivary secretion: Secondary | ICD-10-CM | POA: Insufficient documentation

## 2022-06-14 NOTE — Assessment & Plan Note (Addendum)
Presenting today for an acute visit to discuss persistent symptoms of xerostomia.  He states that his tongue feels like a sponge.  This has occurred in the setting of nasal congestion with white sputum production.  No acute findings present on exam today. He has been diligently drinking water to keep his oral cavity moist.  He has also been using Biotene rinses for lubrication.  Marc [name] is additionally prescribed cevimeline 30 mg BID for treatment of xerostomia in the setting of Sjogren's disease. -I recommended that Marc [name] continue his current treatment measures to manage his symptoms.  In addition to treating xerostomia, I recommended that he use saline rinse, nasal spray, and daily antihistamine to treat his nasal congestion as this seems to have triggered an acute worsening of his symptoms.  I additionally recommended that Marc [name] take an additional dose of cevimeline 30 mg as the standard dose appears to be 3 times daily, whereas he is currently taking the medication twice daily. -He will return to care if his symptoms worsen or fail to improve.  Otherwise, he has routine follow-up scheduled with Dr. Moshe Cipro on 11/15.

## 2022-06-15 ENCOUNTER — Other Ambulatory Visit: Payer: Self-pay | Admitting: Family Medicine

## 2022-06-19 ENCOUNTER — Ambulatory Visit: Payer: Medicare HMO | Admitting: Gastroenterology

## 2022-06-24 ENCOUNTER — Ambulatory Visit (INDEPENDENT_AMBULATORY_CARE_PROVIDER_SITE_OTHER): Payer: Medicare HMO | Admitting: Family Medicine

## 2022-06-24 ENCOUNTER — Encounter: Payer: Self-pay | Admitting: Family Medicine

## 2022-06-24 VITALS — BP 128/72 | HR 46 | Ht 69.0 in | Wt 139.0 lb

## 2022-06-24 DIAGNOSIS — Z0001 Encounter for general adult medical examination with abnormal findings: Secondary | ICD-10-CM

## 2022-06-24 DIAGNOSIS — Z23 Encounter for immunization: Secondary | ICD-10-CM

## 2022-06-24 DIAGNOSIS — R350 Frequency of micturition: Secondary | ICD-10-CM | POA: Diagnosis not present

## 2022-06-24 DIAGNOSIS — Z125 Encounter for screening for malignant neoplasm of prostate: Secondary | ICD-10-CM

## 2022-06-24 DIAGNOSIS — E785 Hyperlipidemia, unspecified: Secondary | ICD-10-CM | POA: Diagnosis not present

## 2022-06-24 DIAGNOSIS — E559 Vitamin D deficiency, unspecified: Secondary | ICD-10-CM | POA: Diagnosis not present

## 2022-06-24 DIAGNOSIS — K219 Gastro-esophageal reflux disease without esophagitis: Secondary | ICD-10-CM

## 2022-06-24 DIAGNOSIS — I10 Essential (primary) hypertension: Secondary | ICD-10-CM | POA: Diagnosis not present

## 2022-06-24 DIAGNOSIS — N401 Enlarged prostate with lower urinary tract symptoms: Secondary | ICD-10-CM | POA: Diagnosis not present

## 2022-06-24 NOTE — Patient Instructions (Addendum)
Follow-up in 4 months call if you need me sooner.  Flu vaccine in office today.  Please get current COVID-vaccine at your pharmacy.  Please get the shingles vaccines at your pharmacy.  Lab work today lipid panel PSA TSH and vitamin D.  You are being referred to urology regarding enlarged prostate which is symptomatic.  You are being referred for upper endoscopy/uncontrolled GERD symptoms, and I will directly reach out to your provider also.  Best for 2024.!  Keep exercising, and doing your best to nourish yourself  Great that your recent abdominal scan is normal as far as intestinal organs are concerned Thanks for choosing [facility], we consider it a privelige to serve you.

## 2022-06-24 NOTE — Progress Notes (Signed)
   Marc [name]     MRN: [id number]      DOB: May 13, 1956   HPI: Patient is in for annual physical exam. C/o urinary frequency with poor stream, nocturia, recent scan shows mildly enlarged prostate C/o uncontrolled reflux symptoms, c/o heartburn. Recent labs, if available are reviewed. Immunization is reviewed , and  updated if needed.    PE; BP 128/72   Pulse (!) 46   Ht '5\' 9"'$  (1.753 m)   Wt 139 lb 0.6 oz (63.1 kg)   SpO2 98%   BMI 20.53 kg/m   Pleasant male, alert and oriented x 3, in no cardio-pulmonary distress. Afebrile. HEENT No facial trauma or asymetry. Sinuses non tender. EOMI External ears normal,  Neck: supple, no adenopathy,JVD or thyromegaly.No bruits.  Chest: Clear to ascultation bilaterally.No crackles or wheezes. Non tender to palpation  Cardiovascular system; Heart sounds normal,  S1 and  S2 ,no S3.  No murmur, or thrill. Apical beat not displaced Peripheral pulses normal.  Abdomen: Soft, mild epigastric tenderness, no organomegaly or masses. Bowel sounds normal. No guarding,  or rebound.    Musculoskeletal exam: Full ROM of spine, hips , shoulders and knees. No deformity ,swelling or crepitus noted. No muscle wasting or atrophy.   Neurologic: Cranial nerves 2 to 12 intact. Power, tone ,sensation  normal throughout. No disturbance in gait. No tremor.  Skin: Intact, no ulceration, erythema . Pigmentation normal throughout  Psych; Normal mood and affect. Judgement and concentration normal   Assessment & Plan:  Annual visit for general adult medical examination with abnormal findings Annual exam as documented. . Immunization and cancer screening needs are specifically addressed at this visit.   Enlarged prostate with lower urinary tract symptoms (LUTS) Recent pelvic scan shows prostate enlargement and he is symptomatic, urology to manage  Gastroesophageal reflux disease Reports chronic reflux symptoms uncontrolled despite  medication refer GI has upcoming colonoscopy planned

## 2022-06-25 LAB — PSA, TOTAL AND FREE
PSA, Free Pct: 30 %
PSA, Free: 0.21 ng/mL
Prostate Specific Ag, Serum: 0.7 ng/mL (ref 0.0–4.0)

## 2022-06-25 LAB — LIPID PANEL
Chol/HDL Ratio: 2.5 ratio (ref 0.0–5.0)
Cholesterol, Total: 192 mg/dL (ref 100–199)
HDL: 77 mg/dL (ref >39)
LDL Chol Calc (NIH): 108 mg/dL — ABNORMAL HIGH (ref 0–99)
Triglycerides: 37 mg/dL (ref 0–149)
VLDL Cholesterol Cal: 7 mg/dL (ref 5–40)

## 2022-06-25 LAB — TSH: TSH: 1.51 u[IU]/mL (ref 0.450–4.500)

## 2022-06-25 LAB — VITAMIN D 25 HYDROXY (VIT D DEFICIENCY, FRACTURES): Vit D, 25-Hydroxy: 56.6 ng/mL (ref 30.0–100.0)

## 2022-06-26 ENCOUNTER — Encounter: Payer: Self-pay | Admitting: Family Medicine

## 2022-06-26 DIAGNOSIS — N401 Enlarged prostate with lower urinary tract symptoms: Secondary | ICD-10-CM | POA: Insufficient documentation

## 2022-06-26 NOTE — Assessment & Plan Note (Signed)
Recent pelvic scan shows prostate enlargement and he is symptomatic, urology to manage

## 2022-06-26 NOTE — Assessment & Plan Note (Addendum)
Annual exam as documented. . Immunization and cancer screening needs are specifically addressed at this visit.  

## 2022-06-26 NOTE — Assessment & Plan Note (Signed)
Reports chronic reflux symptoms uncontrolled despite medication refer GI has upcoming colonoscopy planned

## 2022-07-11 ENCOUNTER — Other Ambulatory Visit: Payer: Self-pay | Admitting: Family Medicine

## 2022-07-17 DIAGNOSIS — Z881 Allergy status to other antibiotic agents status: Secondary | ICD-10-CM | POA: Diagnosis not present

## 2022-07-17 DIAGNOSIS — K219 Gastro-esophageal reflux disease without esophagitis: Secondary | ICD-10-CM | POA: Diagnosis not present

## 2022-07-29 ENCOUNTER — Ambulatory Visit: Payer: Medicare HMO | Admitting: Urology

## 2022-07-29 DIAGNOSIS — N401 Enlarged prostate with lower urinary tract symptoms: Secondary | ICD-10-CM

## 2022-08-08 ENCOUNTER — Other Ambulatory Visit: Payer: Self-pay | Admitting: Family Medicine

## 2022-08-12 ENCOUNTER — Inpatient Hospital Stay: Payer: Medicare HMO | Attending: Hematology

## 2022-08-12 DIAGNOSIS — Z8619 Personal history of other infectious and parasitic diseases: Secondary | ICD-10-CM | POA: Insufficient documentation

## 2022-08-12 DIAGNOSIS — D509 Iron deficiency anemia, unspecified: Secondary | ICD-10-CM | POA: Diagnosis not present

## 2022-08-12 DIAGNOSIS — R779 Abnormality of plasma protein, unspecified: Secondary | ICD-10-CM | POA: Diagnosis not present

## 2022-08-12 DIAGNOSIS — Z7982 Long term (current) use of aspirin: Secondary | ICD-10-CM | POA: Diagnosis not present

## 2022-08-12 DIAGNOSIS — D696 Thrombocytopenia, unspecified: Secondary | ICD-10-CM | POA: Diagnosis not present

## 2022-08-12 DIAGNOSIS — D72819 Decreased white blood cell count, unspecified: Secondary | ICD-10-CM | POA: Diagnosis not present

## 2022-08-12 DIAGNOSIS — Z87891 Personal history of nicotine dependence: Secondary | ICD-10-CM | POA: Insufficient documentation

## 2022-08-12 DIAGNOSIS — D649 Anemia, unspecified: Secondary | ICD-10-CM

## 2022-08-12 DIAGNOSIS — R778 Other specified abnormalities of plasma proteins: Secondary | ICD-10-CM

## 2022-08-12 DIAGNOSIS — Z79899 Other long term (current) drug therapy: Secondary | ICD-10-CM | POA: Diagnosis not present

## 2022-08-12 LAB — CBC WITH DIFFERENTIAL/PLATELET
Abs Immature Granulocytes: 0.01 10*3/uL (ref 0.00–0.07)
Basophils Absolute: 0 10*3/uL (ref 0.0–0.1)
Basophils Relative: 1 %
Eosinophils Absolute: 0.2 10*3/uL (ref 0.0–0.5)
Eosinophils Relative: 5 %
HCT: 42.1 % (ref 39.0–52.0)
Hemoglobin: 14.1 g/dL (ref 13.0–17.0)
Immature Granulocytes: 0 %
Lymphocytes Relative: 27 %
Lymphs Abs: 1.2 10*3/uL (ref 0.7–4.0)
MCH: 31.1 pg (ref 26.0–34.0)
MCHC: 33.5 g/dL (ref 30.0–36.0)
MCV: 92.7 fL (ref 80.0–100.0)
Monocytes Absolute: 0.3 10*3/uL (ref 0.1–1.0)
Monocytes Relative: 7 %
Neutro Abs: 2.7 10*3/uL (ref 1.7–7.7)
Neutrophils Relative %: 60 %
Platelets: 146 10*3/uL — ABNORMAL LOW (ref 150–400)
RBC: 4.54 MIL/uL (ref 4.22–5.81)
RDW: 13 % (ref 11.5–15.5)
WBC: 4.5 10*3/uL (ref 4.0–10.5)
nRBC: 0 % (ref 0.0–0.2)

## 2022-08-12 LAB — IRON AND TIBC
Iron: 67 ug/dL (ref 45–182)
Saturation Ratios: 20 % (ref 17.9–39.5)
TIBC: 334 ug/dL (ref 250–450)
UIBC: 267 ug/dL

## 2022-08-12 LAB — VITAMIN B12: Vitamin B-12: 642 pg/mL (ref 180–914)

## 2022-08-12 LAB — FERRITIN: Ferritin: 56 ng/mL (ref 24–336)

## 2022-08-12 LAB — FOLATE: Folate: 17.1 ng/mL (ref >5.9)

## 2022-08-13 ENCOUNTER — Encounter: Payer: Self-pay | Admitting: *Deleted

## 2022-08-13 MED ORDER — PEG 3350-KCL-NA BICARB-NACL 420 G PO SOLR: 4000.0000 mL | Freq: Once | ORAL | 0 refills | Status: AC

## 2022-08-14 LAB — METHYLMALONIC ACID, SERUM: Methylmalonic Acid, Quantitative: 164 nmol/L (ref 0–378)

## 2022-08-16 LAB — COPPER, SERUM: Copper: 62 ug/dL — ABNORMAL LOW (ref 69–132)

## 2022-08-18 NOTE — Progress Notes (Unsigned)
Marc [name], [place] [address]   CLINIC:  Medical Oncology/Hematology  PCP:  Marc Helper, MD [address], [place] [address] [phone]   REASON FOR VISIT:  Follow-up for abnormal immunofixation + leukopenia/thrombocytopenia + iron deficiency anemia  PRIOR THERAPY: None  CURRENT THERAPY: Oral iron supplementation  INTERVAL HISTORY:   Marc [name] 67 y.o. male returns for routine follow-up of his iron deficiency anemia, leukopenia, and thrombocytopenia.  He was last seen by Marc Abernethy PA-C on 04/09/2022.  At today's visit, he reports feeling fair.  He continues to report the same constellation of symptoms (also seeing rheumatology) including chronic pain syndrome, dry/burning mouth, chills, night sweats, tinnitus, intermittent hearing loss, positional dizziness, and nondiabetic peripheral neuropathy. No new bone pain, unexplained fevers, or unintentional weight loss.  No new masses or lymphadenopathy.  He has been taking iron tablet daily.  He continues to take ibuprofen twice daily despite being advised not to, since it is "the only thing that helps with his pain and inflammation."  No bright red blood per rectum or melena.  He admits to fatigue but denies any pica.    He has 70% energy and 80% appetite. He endorses that he is maintaining a stable weight.   ASSESSMENT & PLAN:  1.  Abnormal SPEP/immunofixation - Patient seen at the request of his rheumatologist (Marc [name]) for evaluation of abnormal immunofixation. - Labs from rheumatology: Immunofixation from 02/05/2022 showed poorly defined area of restricted protein mobility reactive with lambda light chain antisera.   Quantitative immunoglobulins were normal but on the lower limits of normal values.   SPEP negative for M spike, but was consistent with hypogammaglobulinemia (gamma globulin 0.7). - Hematology work-up (03/12/2022) NEGATIVE for any plasma cell  dyscrasia SPEP unremarkable. Immunofixation negative. Mildly elevated kappa free light chain 24.2 with normal lambda 12.1 and mildly elevated ratio 2.0. CBC shows mild normocytic anemia with Hgb 12.7/MCV 91.0 CMP with normal creatinine 0.76.  (Transaminitis chronic/at baseline in the setting of hepatitis C and liver disease following with Marc [name]) Mildly elevated LDH 193.  Normal beta-2 microglobulin.  - Constellation of symptoms (also seeing rheumatology) including chronic pain syndrome, chills, night sweats, tinnitus, intermittent hearing loss, positional dizziness, and nondiabetic peripheral neuropathy.   - No new bone pain, unexplained fevers, or unintentional weight loss. - PLAN: We will repeat MGUS/myeloma panel in 1 year to rule out any developing plasma cell dyscrasia.   2.  Thrombocytopenia and leukopenia - Previously seen at [facility] from 2017-2018 due to leukopenia and thrombocytopenia in the setting of hepatitis C and fatty liver disease, as well as elevated ferritin/iron saturation. - CBC (03/12/2022): WBC 5.0, Hgb 12.7/MCV 91.0, platelets 140, normal differential - Most recent CBC (08/12/2022): WBC 4.5, normal differential.  Platelets 146. - Nutritional panel (08/12/2022): Copper deficiency (62), normal B12 642, normal MMA, normal folate. - PLAN:  Recommend starting copper 2 mg daily. - Check CBC/D, copper, and ceruloplasmin levels in 6 months.   3.  Iron deficiency anemia - Patient has history of elevated ferritin and iron saturation in the setting of liver disease, but most recent labs consistent with iron deficiency anemia. - Colonoscopy (12/05/2019): Limited by poor prep.  Polyp x1, external and internal hemorrhoids - EGD (12/05/2019): Mild gastritis and duodenitis secondary to aspirin and NSAIDs - Hemoccult stool positive x 3 (September 2023).  Following with GI, scheduled for upcoming EGD/colonoscopy on 09/07/2022.   - Denies bright red  blood per rectum or  melena - Takes ibuprofen twice daily - Has been taking iron tablet once daily since August 2023. - Admits to chronic fatigue.  Denies pica. - Labs (03/12/2022) with Hgb 12.7/MCV 91.0, ferritin 12, iron saturation 10% - Labs improved (08/12/2022) with Hgb normalized at 14.1, ferritin 56, iron saturation 20%. - PLAN: Continue oral iron with ferrous sulfate 325 mg daily  - Advised to STOP all NSAID medications - Continue GI follow-up - We will repeat CBC and iron panel with RTC in 6 months.   4.  Other history - PMH: Osteoarthritis, chronic pain syndrome, history of hepatitis C and transaminitis (follows with Marc [name] at [facility]), GERD, recovering alcoholism in remission (quit drinking 2020), hypertension, prediabetes, history of colon polyps, anxiety/depression, vitamin D deficiency.  He follows with rheumatology due to positive ANA, sicca symptoms, constitutional symptoms, and arthritic pain (recently referred to [name] for second opinion). - SOCIAL:  Lives alone and is functionally independent.  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 - SUBSTANCE: Past history of substance abuse, but has been sober for the past 3 years.  He reports prior history of daily heavy alcohol use on and off for about 45 years.  He reports previous heavy drug use including IV heroin, cocaine, MDMA, and opioid/benzodiazepine pills.  He worked very hard via Alcoholics Anonymous over the past 3 years. - FAMILY: No family history of multiple myeloma.  Mother deceased secondary to urachal bladder cancer.  Sister deceased secondary to metastatic stomach cancer.  Maternal grandmother with unspecified cancer.  Maternal grandfather with brain cancer.  PLAN SUMMARY: >> Continue oral iron.  Start OTC copper 2 mg daily. >> Labs in 6 months (CBC/D, ferritin, iron/TIBC, copper, ceruloplasmin) >> Office visit in 6 months, TWO weeks after labs    REVIEW OF SYSTEMS:   Review of Systems   Constitutional:  Positive for fatigue. Negative for appetite change, chills, diaphoresis, fever and unexpected weight change.  HENT:   Positive for trouble swallowing (dry mouth and burning tongue). Negative for lump/mass and nosebleeds.   Eyes:  Negative for eye problems.  Respiratory:  Negative for cough, hemoptysis and shortness of breath.   Cardiovascular:  Negative for chest pain, leg swelling and palpitations.  Gastrointestinal:  Positive for abdominal pain and constipation. Negative for blood in stool, diarrhea, nausea and vomiting.  Genitourinary:  Negative for hematuria.   Musculoskeletal:  Positive for arthralgias.  Skin: Negative.   Neurological:  Positive for numbness. Negative for dizziness, headaches and light-headedness.  Hematological:  Does not bruise/bleed easily.  Psychiatric/Behavioral:  Positive for depression and sleep disturbance. The patient is nervous/anxious.      PHYSICAL EXAM:  ECOG PERFORMANCE STATUS: 1 - Symptomatic but completely ambulatory  There were no vitals filed for this visit. There were no vitals filed for this visit. Physical Exam Constitutional:      Appearance: Normal appearance.  HENT:     Head: Normocephalic and atraumatic.     Mouth/Throat:     Mouth: Mucous membranes are moist.  Eyes:     Extraocular Movements: Extraocular movements intact.     Pupils: Pupils are equal, round, and reactive to light.  Cardiovascular:     Rate and Rhythm: Regular rhythm. Bradycardia present.     Pulses: Normal pulses.     Heart sounds: Normal heart sounds.  Pulmonary:     Effort: Pulmonary effort is normal.     Breath sounds: Normal breath sounds.  Abdominal:  General: Bowel sounds are normal.     Palpations: Abdomen is soft.     Tenderness: There is no abdominal tenderness.  Musculoskeletal:        General: No swelling.     Right lower leg: No edema.     Left lower leg: No edema.  Lymphadenopathy:     Cervical: No cervical adenopathy.   Skin:    General: Skin is warm and dry.  Neurological:     General: No focal deficit present.     Mental Status: He is alert and oriented to person, place, and time.  Psychiatric:        Mood and Affect: Mood normal.        Behavior: Behavior normal.     PAST MEDICAL/SURGICAL HISTORY:  Past Medical History:  Diagnosis Date   Acute encephalopathy 12/29/2017   Hospitalized 5/22 to 5/23 with acute encephalopathy, unclear etiology   Acute gout involving toe of right foot 09/14/2019   Alcoholism ([place])    Allergy    Anxiety    Arthritis    Back problem    Cataract 01/2021   Chest wall pain 03/28/2017   Chronic hepatitis C without hepatic coma ([place]) 01/04/2009   Qualifier: Diagnosis of  By: Westly Pam. 2008: CT A/P NO CIRRHOSIS AUG 2013 MRCP- DILATED CBD/NO CIRRHOSIS DEC 2013 AST 99-102  ALT 145-156 PLT 147 HB 13.4 ALB 4.4-4.7 T BIL 0.7    Depression    Dilation of biliary tract 07/14/2012   EUS JAN 2014 BENIGN CBD DILATION    Epigastric pain 07/14/2012   GERD (gastroesophageal reflux disease)    Gout    HCV (hepatitis C virus) 06/16/2015   Hepatitis B    Hepatitis C 1979   Hepatitis C reactive    LIVER BX 2008-CHRONIC ACTIVE HEPATITIS   HTN (hypertension)    Hx of adenomatous colonic polyps 2008   due for surveillance 2017   Hypercholesterolemia    IBS (irritable bowel syndrome)    Iron deficiency anemia 03/13/2022   Irritable bowel syndrome 01/04/2009   Qualifier: Diagnosis of  By: Westly Pam.    Knee pain    Left forearm pain 08/15/2017   Leukopenia 41/74/0814   Lichen planus    Narcotic addiction ([place]) 06/10/2011   [facility]- Oct 2012    Normal cardiac stress test 05/25/2011   [facility]   Normal echocardiogram 05/25/2011   EF 55%, mild TR   Oral lichen planus    Plaque psoriasis    RUQ pain 07/14/2012   Status post dilation of esophageal narrowing    Substance abuse ([place])    narcotic addiction    Thrombocytopenia ([place]) 11/06/2015   Trigger middle finger of left hand 06/20/2012   Past Surgical History:  Procedure Laterality Date   24 HOUR [place] STUDY  10/21/2020   Procedure: 24 HOUR [place] STUDY;  Surgeon: Mauri Pole, MD;  Location: [facility];  Service: Endoscopy;;   APPENDECTOMY     BALLOON DILATION N/A 05/28/2020   Procedure: [place];  Surgeon: Eloise Harman, DO;  [facility];  Service: Endoscopy;  Laterality: N/A;   BIOPSY  05/28/2020   Procedure: BIOPSY;  Surgeon: Eloise Harman, DO;  [facility];  Service: Endoscopy;;   CARPAL TUNNEL RELEASE     rt   COLONOSCOPY  2008 [facility] ARS D100 V8 PHEN 12.5   2 SIMPLE ADENOMAS (< 1 CM)  COLONOSCOPY WITH PROPOFOL N/A 12/05/2019   TI normal, 3 mm polyp in ascending colon, external and internal hemorrhoids. Polyp removed but not retrieved. Poor prep   ESOPHAGEAL MANOMETRY N/A 10/21/2020   Procedure: ESOPHAGEAL MANOMETRY (EM);  Surgeon: Mauri Pole, MD;  [facility]: [facility];  Service: Endoscopy;  Laterality: N/A;   ESOPHAGOGASTRODUODENOSCOPY  04/26/2012   Dr. Oneida Alar: Non-erosive gastritis (inflammation) was found in the gastric antrum but no H.pylori; multiple biopsies (duodenal bx negative for Celiac)/ The mucosa of the esophagus appeared normal   ESOPHAGOGASTRODUODENOSCOPY  07/28/2021   Reports that his health; normal exam s/p esophageal dilation, esophageal biopsies obtained and were benign.   ESOPHAGOGASTRODUODENOSCOPY (EGD) WITH PROPOFOL N/A 12/05/2019   mild gastritis, duodenitis due to aspirin/NSAIDs.    ESOPHAGOGASTRODUODENOSCOPY (EGD) WITH PROPOFOL N/A 05/28/2020   Procedure: ESOPHAGOGASTRODUODENOSCOPY (EGD) WITH PROPOFOL;  Surgeon: Eloise Harman, DO;  [facility];  Service: Endoscopy;  Laterality: N/A;  2:30pm   EUS  09/08/2012   Dr. Ardis Hughs: CBD dilated but no stones. Query secondary to Sphincter of Oddi stenosis, ?dysfunction, but clinically without symptoms    FRACTURE SURGERY N/A    [employer] 08/25/2020   HARDWARE REMOVAL Right 02/12/2014   Procedure: HARDWARE REMOVAL;  Surgeon: Jolyn Nap, MD;  Location: [facility];  Service: Orthopedics;  Laterality: Right;   KNEE ARTHROSCOPY     Neurostimulator implant     POLYPECTOMY  12/05/2019   Procedure: POLYPECTOMY;  Surgeon: Danie Binder, MD;  [facility];  Service: Endoscopy;;   right ring finger     spinal stenosis, had screws put in neck  04/11/2009   DR [place] Right 02/12/2014   Procedure: RIGHT ULNAR SHORTENING AND OSTEOTOMY ;  Surgeon: Jolyn Nap, MD;  Location: [facility];  Service: Orthopedics;  Laterality: Right;   ULNAR NERVE TRANSPOSITION     UPPER GASTROINTESTINAL ENDOSCOPY  2008 SLF ABD PAIN WEIGHT LOSS d100 v8 phen 12.5   NL   WRIST SURGERY Right 08/2013   Dr. Edmonia Lynch    SOCIAL HISTORY:  Social History   Socioeconomic History   Marital status: Divorced    Spouse name: Not on file   Number of children: 1   Years of education: Not on file   Highest education level: Not on file  Occupational History   Occupation: Disabled/retired  Tobacco Use   Smoking status: Former    Packs/day: 1.50    Years: 8.00    Total pack years: 12.00    Types: Cigarettes    Quit date: 08/13/1986    Years since quitting: 36.0    Passive exposure: Current   Smokeless tobacco: Never   Tobacco comments:    quit 20 + yrs ago  Vaping Use   Vaping Use: Never used  Substance and Sexual Activity   Alcohol use: Not Currently    Comment: Used to drink heavily- went to treatment in May 2020. No alcohol in 3 years (03/13/2022)   Drug use: Not Currently    Comment: pain medications, klonopin- none in the last 17 months. Currently in [place]   Sexual activity: Not Currently  Other Topics Concern   Not on file  Social History Narrative   Not on file   Social Determinants of Health    Financial Resource Strain: Low Risk  (03/09/2022)   Overall Financial Resource Strain (CARDIA)    Difficulty of  Paying Living Expenses: Not hard at all  Food Insecurity: No Food Insecurity (03/09/2022)   Hunger Vital Sign    Worried About Running Out of Food in the Last Year: Never true    Ran Out of Food in the Last Year: Never true  Transportation Needs: No Transportation Needs (03/09/2022)   PRAPARE - Hydrologist (Medical): No    Lack of Transportation (Non-Medical): No  Physical Activity: Sufficiently Active (03/09/2022)   Exercise Vital Sign    Days of Exercise per Week: 7 days    Minutes of Exercise per Session: 150+ min  Stress: Stress Concern Present (03/09/2022)   Anna    Feeling of Stress : To some extent  Social Connections: Socially Isolated (03/09/2022)   Social Connection and Isolation Panel [NHANES]    Frequency of Communication with Friends and Family: More than three times a week    Frequency of Social Gatherings with Friends and Family: More than three times a week    Attends Religious Services: Never    Marine scientist or Organizations: No    Attends Archivist Meetings: Never    Marital Status: Divorced  Human resources officer Violence: Not At Risk (03/09/2022)   Humiliation, Afraid, Rape, and Kick questionnaire    Fear of Current or Ex-Partner: No    Emotionally Abused: No    Physically Abused: No    Sexually Abused: No    FAMILY HISTORY:  Family History  Problem Relation Age of Onset   Bladder Cancer Mother        rare form    Cancer Sister        Metastatic abdominal   Emotional abuse Sister    Stroke Sister    Emotional abuse Sister    Heart failure Father    Stroke Father    Alcohol abuse Father    Dementia Paternal Aunt    Alcohol abuse Paternal Uncle    Dementia Paternal Uncle    Dementia Paternal Grandmother    Stroke Paternal  Grandmother    ADD / ADHD Cousin    Bipolar disorder Cousin    Anxiety disorder Cousin    Depression Cousin        Committed suicide   Alcohol abuse Cousin    OCD Cousin    Paranoid behavior Cousin    Brain cancer Maternal Grandfather    Heart defect Other        FAMILY HX   Colon cancer Maternal Uncle    Colon cancer Cousin    Colon polyps Neg Hx    Drug abuse Neg Hx    Schizophrenia Neg Hx    Seizures Neg Hx    Sexual abuse Neg Hx    Physical abuse Neg Hx     CURRENT MEDICATIONS:  Outpatient Encounter Medications as of 08/19/2022  Medication Sig   AMBULATORY NON FORMULARY MEDICATION Kratom Take 2 tablet by mouth twice daily   AMBULATORY NON FORMULARY MEDICATION Friska caffiene 1-2 tablet twice daily   amLODipine (NORVASC) 2.5 MG tablet TAKE (1) TABLET BY MOUTH ONCE DAILY.   aspirin EC 81 MG tablet Take 81 mg by mouth daily.   baclofen (LIORESAL) 10 MG tablet TAKE (1/2) TABLET ('5MG'$ ) BY MOUTH TWICE DAILY BEFORE A MEAL.   cevimeline (EVOXAC) 30 MG capsule Take 30 mg by mouth 2 (two) times daily. (Patient not taking: Reported on 06/24/2022)   famotidine (PEPCID) 20 MG  tablet TAKE 1 TABLET BY MOUTH TWICE DAILY. TAKE WITH BREAKFAST AND AT BEDTIME.   ferrous sulfate 325 (65 FE) MG tablet Take 325 mg by mouth daily with breakfast.   ibuprofen (ADVIL) 200 MG tablet Take 400 mg by mouth 2 (two) times daily.   lubiprostone (AMITIZA) 24 MCG capsule TAKE 1 CAPSULE TWICE DAILY WITH MEALS.   Multiple Vitamin (MULTIVITAMIN WITH MINERALS) TABS tablet Take 1 tablet by mouth daily.   Nutritional Supplements (KETO PO) Take 1 tablet by mouth in the morning, at noon, and at bedtime.    pilocarpine (SALAGEN) 5 MG tablet Take 5 mg by mouth 3 (three) times daily.   RABEprazole (ACIPHEX) 20 MG tablet Take 20 mg by mouth daily.   senna (SENOKOT) 8.6 MG tablet Take 2 tablets by mouth 2 (two) times daily.   Turmeric 400 MG CAPS Take 800 mg by mouth daily.    Vitamin D3 (VITAMIN D) 25 MCG tablet Take  4,000 Units by mouth daily.    No facility-administered encounter medications on file as of 08/19/2022.    ALLERGIES:  Allergies  Allergen Reactions   Levofloxacin Nausea Only and Other (See Comments)    "Creepy" feeling, skin didn't feel right, sort of itchy.   Ciprofloxacin     Sweating   Sulfa Antibiotics     LABORATORY DATA:  I have reviewed the labs as listed.  CBC    Component Value Date/Time   WBC 4.5 08/12/2022 0815   RBC 4.54 08/12/2022 0815   HGB 14.1 08/12/2022 0815   HGB 13.7 10/15/2020 1021   HCT 42.1 08/12/2022 0815   HCT 39.2 10/15/2020 1021   PLT 146 (L) 08/12/2022 0815   PLT 140 (L) 10/15/2020 1021   MCV 92.7 08/12/2022 0815   MCV 92 10/15/2020 1021   MCH 31.1 08/12/2022 0815   MCHC 33.5 08/12/2022 0815   RDW 13.0 08/12/2022 0815   RDW 12.0 10/15/2020 1021   LYMPHSABS 1.2 08/12/2022 0815   LYMPHSABS 1.3 10/15/2020 1021   MONOABS 0.3 08/12/2022 0815   EOSABS 0.2 08/12/2022 0815   EOSABS 0.2 10/15/2020 1021   BASOSABS 0.0 08/12/2022 0815   BASOSABS 0.0 10/15/2020 1021      Latest Ref Rng & Units 05/27/2022   10:19 AM 03/12/2022   10:10 AM 02/05/2022    9:07 AM  CMP  Glucose 65 - 99 mg/dL 111  117  103   BUN 7 - 25 mg/dL '26  28  26   '$ Creatinine 0.70 - 1.35 mg/dL 0.85  0.76  0.88   Sodium 135 - 146 mmol/L 142  139  140   Potassium 3.5 - 5.3 mmol/L 4.3  4.2  4.0   Chloride 98 - 110 mmol/L 107  108  106   CO2 20 - 32 mmol/L '26  27  28   '$ Calcium 8.6 - 10.3 mg/dL 9.8  9.3  9.6   Total Protein 6.1 - 8.1 g/dL 6.7  7.0  6.9    6.8   Total Bilirubin 0.2 - 1.2 mg/dL 0.3  0.5  0.3   Alkaline Phos 38 - 126 U/L  63    AST 10 - 35 U/L 39  45  57   ALT 9 - 46 U/L 38  46  65     DIAGNOSTIC IMAGING:  I have independently reviewed the relevant imaging and discussed with the patient.   WRAP UP:  All questions were answered. The patient knows to call the clinic with any problems,  questions or concerns.  Medical decision making: Moderate  Time spent on  visit: I spent 20 minutes counseling the patient face to face. The total time spent in the appointment was 30 minutes and more than 50% was on counseling.  ZACHARY NOLE, PA-C  08/19/22 9:01 AM

## 2022-08-19 ENCOUNTER — Inpatient Hospital Stay: Payer: Medicare HMO | Admitting: Physician Assistant

## 2022-08-19 VITALS — BP 144/71 | HR 50 | Temp 97.7°F | Resp 17 | Ht 69.0 in | Wt 138.8 lb

## 2022-08-19 DIAGNOSIS — R779 Abnormality of plasma protein, unspecified: Secondary | ICD-10-CM | POA: Diagnosis not present

## 2022-08-19 DIAGNOSIS — Z7982 Long term (current) use of aspirin: Secondary | ICD-10-CM | POA: Diagnosis not present

## 2022-08-19 DIAGNOSIS — Z79899 Other long term (current) drug therapy: Secondary | ICD-10-CM | POA: Diagnosis not present

## 2022-08-19 DIAGNOSIS — D72819 Decreased white blood cell count, unspecified: Secondary | ICD-10-CM | POA: Diagnosis not present

## 2022-08-19 DIAGNOSIS — D696 Thrombocytopenia, unspecified: Secondary | ICD-10-CM

## 2022-08-19 DIAGNOSIS — E610 Copper deficiency: Secondary | ICD-10-CM

## 2022-08-19 DIAGNOSIS — Z87891 Personal history of nicotine dependence: Secondary | ICD-10-CM | POA: Diagnosis not present

## 2022-08-19 DIAGNOSIS — D649 Anemia, unspecified: Secondary | ICD-10-CM | POA: Diagnosis not present

## 2022-08-19 DIAGNOSIS — D509 Iron deficiency anemia, unspecified: Secondary | ICD-10-CM | POA: Diagnosis not present

## 2022-08-19 DIAGNOSIS — Z8619 Personal history of other infectious and parasitic diseases: Secondary | ICD-10-CM | POA: Diagnosis not present

## 2022-08-19 MED ORDER — COPPER 2 MG PO TABS: 2.0000 mg | Freq: Every day | 1 refills | Status: DC

## 2022-08-19 NOTE — Patient Instructions (Addendum)
[facility] at [facility] **   You were seen today by Tarri Abernethy PA-C for your follow-up visit.    IRON DEFICIENCY ANEMIA Your iron levels have improved.  Continue to take iron tablet once daily. You have previously had gastritis/duodenitis (irritation in your stomach and small intestine) from taking NSAID medications.  It is EXTREMELY IMPORTANT that you stop taking all ibuprofen or other NSAID medications at this time. Continue to follow-up with gastroenterology for EGD/colonoscopy in January 2024.  LOW PLATELETS & LOW WHITE BLOOD CELLS This is related to your underlying liver disease. Your white blood cells and platelets are only mildly low at this time. Your COPPER levels were slightly low.  Prescription has been sent to your pharmacy for copper supplement 2 mg daily.  FOLLOW-UP APPOINTMENT: Labs in 6 months with office visit 2 weeks after  ** Thank you for trusting me with your healthcare!  I strive to provide all of my patients with quality care at each visit.  If you receive a survey for this visit, I would be so grateful to you for taking the time to provide feedback.  Thank you in advance!  ~ Rodney Yera                   Dr. Derek Jack   &   Tarri Abernethy, PA-C   - - - - - - - - - - - - - - - - - -    Thank you for choosing [facility] at [facility] to provide your oncology and hematology care.  To afford each patient quality time with our provider, please arrive at least 15 minutes before your scheduled appointment time.   If you have a lab appointment with the [facility] please come in thru the Main Entrance and check in at the main information desk.  You need to re-schedule your appointment should you arrive 10 or more minutes late.  We strive to give you quality time with our providers, and arriving late affects you and other patients whose appointments are after yours.   Also, if you no show three or more times for appointments you may be dismissed from the clinic at the providers discretion.     Again, thank you for choosing [facility].  Our hope is that these requests will decrease the amount of time that you wait before being seen by our physicians.       _____________________________________________________________  Should you have questions after your visit to [facility], please contact our office at [phone] and follow the prompts.  Our office hours are 8:00 a.m. and 4:30 p.m. Monday - Friday.  Please note that voicemails left after 4:00 p.m. may not be returned until the following business day.  We are closed weekends and major holidays.  You do have access to a nurse 24-7, just call the main number to the clinic [phone] and do not press any options, hold on the line and a nurse will answer the phone.    For prescription refill requests, have your pharmacy contact our office and allow 72 hours.

## 2022-08-20 DIAGNOSIS — K117 Disturbances of salivary secretion: Secondary | ICD-10-CM | POA: Diagnosis not present

## 2022-08-20 DIAGNOSIS — R1310 Dysphagia, unspecified: Secondary | ICD-10-CM | POA: Diagnosis not present

## 2022-08-20 DIAGNOSIS — K219 Gastro-esophageal reflux disease without esophagitis: Secondary | ICD-10-CM | POA: Diagnosis not present

## 2022-09-01 NOTE — Patient Instructions (Signed)
Marc [name]  09/01/2022     '@PREFPERIOPPHARMACY'$ @   Your procedure is scheduled on  09/07/2022.   Report to [name] at  0700  A.M.   Call this number if you have problems the morning of surgery:  [phone]  If you experience any cold or flu symptoms such as cough, fever, chills, shortness of breath, etc. between now and your scheduled surgery, please notify us at the above number.   Remember:  Follow the diet and prep instructions given to you by the office.     Take these medicines the morning of surgery with A SIP OF WATER                   amlodipine, baclofen, pepcid, aciphex.     Do not wear jewelry, make-up or nail polish.  Do not wear lotions, powders, or perfumes, or deodorant.  Do not shave 48 hours prior to surgery.  Men may shave face and neck.  Do not bring valuables to the hospital.  [facility] is not responsible for any belongings or valuables.  Contacts, dentures or bridgework may not be worn into surgery.  Leave your suitcase in the car.  After surgery it may be brought to your room.  For patients admitted to the hospital, discharge time will be determined by your treatment team.  Patients discharged the day of surgery will not be allowed to drive home and must have someone with them for 24 hours.    Special instructions:   DO NOT smoke tobacco or vape for 24 hours before your procedure.  Please read over the following fact sheets that you were given. Pain Booklet, Coughing and Deep Breathing, Anesthesia Post-op Instructions, and Care and Recovery After Surgery      Upper Endoscopy, Adult, Care After After the procedure, it is common to have a sore throat. It is also common to have: Mild stomach pain or discomfort. Bloating. Nausea. Follow these instructions at home: The instructions below may help you care for yourself at home. Your health care provider may give you more instructions. If you have questions, ask your health  care provider. If you were given a sedative during the procedure, it can affect you for several hours. Do not drive or operate machinery until your health care provider says that it is safe. If you will be going home right after the procedure, plan to have a responsible adult: Take you home from the hospital or clinic. You will not be allowed to drive. Care for you for the time you are told. Follow instructions from your health care provider about what you may eat and drink. Return to your normal activities as told by your health care provider. Ask your health care provider what activities are safe for you. Take over-the-counter and prescription medicines only as told by your health care provider. Contact a health care provider if you: Have a sore throat that lasts longer than one day. Have trouble swallowing. Have a fever. Get help right away if you: Vomit blood or your vomit looks like coffee grounds. Have bloody, black, or tarry stools. Have a very bad sore throat or you cannot swallow. Have difficulty breathing or very bad pain in your chest or abdomen. These symptoms may be an emergency. Get help right away. Call 911. Do not wait to see if the symptoms will go away. Do not drive yourself to the hospital. Summary After the  procedure, it is common to have a sore throat, mild stomach discomfort, bloating, and nausea. If you were given a sedative during the procedure, it can affect you for several hours. Do not drive until your health care provider says that it is safe. Follow instructions from your health care provider about what you may eat and drink. Return to your normal activities as told by your health care provider. This information is not intended to replace advice given to you by your health care provider. Make sure you discuss any questions you have with your health care provider. Document Revised: 11/05/2021 Document Reviewed: 11/05/2021 Elsevier Patient Education  [place]. Esophageal Dilatation Esophageal dilatation, also called esophageal dilation, is a procedure to widen or open a blocked or narrowed part of the esophagus. The esophagus is the part of the body that moves food and liquid from the mouth to the stomach. You may need this procedure if: You have a buildup of scar tissue in your esophagus that makes it difficult, painful, or impossible to swallow. This can be caused by gastroesophageal reflux disease (GERD). You have cancer of the esophagus. There is a problem with how food moves through your esophagus. In some cases, you may need this procedure repeated at a later time to dilate the esophagus gradually. Tell a health care provider about: Any allergies you have. All medicines you are taking, including vitamins, herbs, eye drops, creams, and over-the-counter medicines. Any problems you or family members have had with anesthetic medicines. Any blood disorders you have. Any surgeries you have had. Any medical conditions you have. Any antibiotic medicines you are required to take before dental procedures. Whether you are pregnant or may be pregnant. What are the risks? Generally, this is a safe procedure. However, problems may occur, including: Bleeding due to a tear in the lining of the esophagus. A hole, or perforation, in the esophagus. What happens before the procedure? Ask your health care provider about: Changing or stopping your regular medicines. This is especially important if you are taking diabetes medicines or blood thinners. Taking medicines such as aspirin and ibuprofen. These medicines can thin your blood. Do not take these medicines unless your health care provider tells you to take them. Taking over-the-counter medicines, vitamins, herbs, and supplements. Follow instructions from your health care provider about eating or drinking restrictions. Plan to have a responsible adult take you home from the hospital or  clinic. Plan to have a responsible adult care for you for the time you are told after you leave the hospital or clinic. This is important. What happens during the procedure? You may be given a medicine to help you relax (sedative). A numbing medicine may be sprayed into the back of your throat, or you may gargle the medicine. Your health care provider may perform the dilatation using various surgical instruments, such as: Simple dilators. This instrument is carefully placed in the esophagus to stretch it. Guided wire bougies. This involves using an endoscope to insert a wire into the esophagus. A dilator is passed over this wire to enlarge the esophagus. Then the wire is removed. Balloon dilators. An endoscope with a small balloon is inserted into the esophagus. The balloon is inflated to stretch the esophagus and open it up. The procedure may vary among health care providers and hospitals. What can I expect after the procedure? Your blood pressure, heart rate, breathing rate, and blood oxygen level will be monitored until you leave the hospital or clinic. Your throat  may feel slightly sore and numb. This will get better over time. You will not be allowed to eat or drink until your throat is no longer numb. When you are able to drink, urinate, and sit on the edge of the bed without nausea or dizziness, you may be able to return home. Follow these instructions at home: Take over-the-counter and prescription medicines only as told by your health care provider. If you were given a sedative during the procedure, it can affect you for several hours. Do not drive or operate machinery until your health care provider says that it is safe. Plan to have a responsible adult care for you for the time you are told. This is important. Follow instructions from your health care provider about any eating or drinking restrictions. Do not use any products that contain nicotine or tobacco, such as cigarettes,  e-cigarettes, and chewing tobacco. If you need help quitting, ask your health care provider. Keep all follow-up visits. This is important. Contact a health care provider if: You have a fever. You have pain that is not relieved by medicine. Get help right away if: You have chest pain. You have trouble breathing. You have trouble swallowing. You vomit blood. You have black, tarry, or bloody stools. These symptoms may represent a serious problem that is an emergency. Do not wait to see if the symptoms will go away. Get medical help right away. Call your local emergency services (911 in the [place] Do not drive yourself to the hospital. Summary Esophageal dilatation, also called esophageal dilation, is a procedure to widen or open a blocked or narrowed part of the esophagus. Plan to have a responsible adult take you home from the hospital or clinic. For this procedure, a numbing medicine may be sprayed into the back of your throat, or you may gargle the medicine. Do not drive or operate machinery until your health care provider says that it is safe. This information is not intended to replace advice given to you by your health care provider. Make sure you discuss any questions you have with your health care provider. Document Revised: 12/13/2019 Document Reviewed: 12/13/2019 Elsevier Patient Education  [place]. Colonoscopy, Adult, Care After The following information offers guidance on how to care for yourself after your procedure. Your health care provider may also give you more specific instructions. If you have problems or questions, contact your health care provider. What can I expect after the procedure? After the procedure, it is common to have: A small amount of blood in your stool for 24 hours after the procedure. Some gas. Mild cramping or bloating of your abdomen. Follow these instructions at home: Eating and drinking  Drink enough fluid to keep your urine pale  yellow. Follow instructions from your health care provider about eating or drinking restrictions. Resume your normal diet as told by your health care provider. Avoid heavy or fried foods that are hard to digest. Activity Rest as told by your health care provider. Avoid sitting for a long time without moving. Get up to take short walks every 1-2 hours. This is important to improve blood flow and breathing. Ask for help if you feel weak or unsteady. Return to your normal activities as told by your health care provider. Ask your health care provider what activities are safe for you. Managing cramping and bloating  Try walking around when you have cramps or feel bloated. If directed, apply heat to your abdomen as told by your health care provider.  Use the heat source that your health care provider recommends, such as a moist heat pack or a heating pad. Place a towel between your skin and the heat source. Leave the heat on for 20-30 minutes. Remove the heat if your skin turns bright red. This is especially important if you are unable to feel pain, heat, or cold. You have a greater risk of getting burned. General instructions If you were given a sedative during the procedure, it can affect you for several hours. Do not drive or operate machinery until your health care provider says that it is safe. For the first 24 hours after the procedure: Do not sign important documents. Do not drink alcohol. Do your regular daily activities at a slower pace than normal. Eat soft foods that are easy to digest. Take over-the-counter and prescription medicines only as told by your health care provider. Keep all follow-up visits. This is important. Contact a health care provider if: You have blood in your stool 2-3 days after the procedure. Get help right away if: You have more than a small spotting of blood in your stool. You have large blood clots in your stool. You have swelling of your abdomen. You have  nausea or vomiting. You have a fever. You have increasing pain in your abdomen that is not relieved with medicine. These symptoms may be an emergency. Get help right away. Call 911. Do not wait to see if the symptoms will go away. Do not drive yourself to the hospital. Summary After the procedure, it is common to have a small amount of blood in your stool. You may also have mild cramping and bloating of your abdomen. If you were given a sedative during the procedure, it can affect you for several hours. Do not drive or operate machinery until your health care provider says that it is safe. Get help right away if you have a lot of blood in your stool, nausea or vomiting, a fever, or increased pain in your abdomen. This information is not intended to replace advice given to you by your health care provider. Make sure you discuss any questions you have with your health care provider. Document Revised: 03/19/2021 Document Reviewed: 03/19/2021 Elsevier Patient Education  [place] After The following information offers guidance on how to care for yourself after your procedure. Your health care provider may also give you more specific instructions. If you have problems or questions, contact your health care provider. What can I expect after the procedure? After the procedure, it is common to have: Tiredness. Little or no memory about what happened during or after the procedure. Impaired judgment when it comes to making decisions. Nausea or vomiting. Some trouble with balance. Follow these instructions at home: For the time period you were told by your health care provider:  Rest. Do not participate in activities where you could fall or become injured. Do not drive or use machinery. Do not drink alcohol. Do not take sleeping pills or medicines that cause drowsiness. Do not make important decisions or sign legal documents. Do not take care of children  on your own. Medicines Take over-the-counter and prescription medicines only as told by your health care provider. If you were prescribed antibiotics, take them as told by your health care provider. Do not stop using the antibiotic even if you start to feel better. Eating and drinking Follow instructions from your health care provider about what you may eat and drink. Drink enough  fluid to keep your urine pale yellow. If you vomit: Drink clear fluids slowly and in small amounts as you are able. Clear fluids include water, ice chips, low-calorie sports drinks, and fruit juice that has water added to it (diluted fruit juice). Eat light and bland foods in small amounts as you are able. These foods include bananas, applesauce, rice, lean meats, toast, and crackers. General instructions  Have a responsible adult stay with you for the time you are told. It is important to have someone help care for you until you are awake and alert. If you have sleep apnea, surgery and some medicines can increase your risk for breathing problems. Follow instructions from your health care provider about wearing your sleep device: When you are sleeping. This includes during daytime naps. While taking prescription pain medicines, sleeping medicines, or medicines that make you drowsy. Do not use any products that contain nicotine or tobacco. These products include cigarettes, chewing tobacco, and vaping devices, such as e-cigarettes. If you need help quitting, ask your health care provider. Contact a health care provider if: You feel nauseous or vomit every time you eat or drink. You feel light-headed. You are still sleepy or having trouble with balance after 24 hours. You get a rash. You have a fever. You have redness or swelling around the IV site. Get help right away if: You have trouble breathing. You have new confusion after you get home. These symptoms may be an emergency. Get help right away. Call 911. Do  not wait to see if the symptoms will go away. Do not drive yourself to the hospital. This information is not intended to replace advice given to you by your health care provider. Make sure you discuss any questions you have with your health care provider. Document Revised: 12/22/2021 Document Reviewed: 12/22/2021 Elsevier Patient Education  [place].

## 2022-09-02 ENCOUNTER — Other Ambulatory Visit: Payer: Self-pay | Admitting: Family Medicine

## 2022-09-03 ENCOUNTER — Encounter (HOSPITAL_COMMUNITY)
Admission: RE | Admit: 2022-09-03 | Discharge: 2022-09-03 | Disposition: A | Discharge status: Home / Self Care | Payer: Medicare HMO | Source: Ambulatory Visit | Attending: Internal Medicine | Admitting: Internal Medicine

## 2022-09-03 ENCOUNTER — Encounter (HOSPITAL_COMMUNITY): Payer: Self-pay

## 2022-09-03 VITALS — BP 122/65 | HR 57 | Temp 97.7°F | Resp 18 | Ht 69.0 in | Wt 138.8 lb

## 2022-09-03 DIAGNOSIS — I10 Essential (primary) hypertension: Secondary | ICD-10-CM

## 2022-09-03 DIAGNOSIS — Z01818 Encounter for other preprocedural examination: Secondary | ICD-10-CM | POA: Insufficient documentation

## 2022-09-03 DIAGNOSIS — D509 Iron deficiency anemia, unspecified: Secondary | ICD-10-CM | POA: Insufficient documentation

## 2022-09-03 DIAGNOSIS — Z8619 Personal history of other infectious and parasitic diseases: Secondary | ICD-10-CM | POA: Insufficient documentation

## 2022-09-03 HISTORY — DX: Sjogren syndrome, unspecified: M35.00

## 2022-09-03 LAB — COMPREHENSIVE METABOLIC PANEL
ALT: 41 U/L (ref 0–44)
AST: 40 U/L (ref 15–41)
Albumin: 3.9 g/dL (ref 3.5–5.0)
Alkaline Phosphatase: 57 U/L (ref 38–126)
Anion gap: 9 (ref 5–15)
BUN: 29 mg/dL — ABNORMAL HIGH (ref 8–23)
CO2: 24 mmol/L (ref 22–32)
Calcium: 9.1 mg/dL (ref 8.9–10.3)
Chloride: 102 mmol/L (ref 98–111)
Creatinine, Ser: 0.92 mg/dL (ref 0.61–1.24)
GFR, Estimated: >60 mL/min (ref >60)
Glucose, Bld: 109 mg/dL — ABNORMAL HIGH (ref 70–99)
Potassium: 3.7 mmol/L (ref 3.5–5.1)
Sodium: 135 mmol/L (ref 135–145)
Total Bilirubin: 0.5 mg/dL (ref 0.3–1.2)
Total Protein: 7 g/dL (ref 6.5–8.1)

## 2022-09-03 LAB — PROTIME-INR
INR: 1 (ref 0.8–1.2)
Prothrombin Time: 13.5 seconds (ref 11.4–15.2)

## 2022-09-03 LAB — CBC WITH DIFFERENTIAL/PLATELET
Abs Immature Granulocytes: 0.01 10*3/uL (ref 0.00–0.07)
Basophils Absolute: 0 10*3/uL (ref 0.0–0.1)
Basophils Relative: 1 %
Eosinophils Absolute: 0.1 10*3/uL (ref 0.0–0.5)
Eosinophils Relative: 3 %
HCT: 41.2 % (ref 39.0–52.0)
Hemoglobin: 13.8 g/dL (ref 13.0–17.0)
Immature Granulocytes: 0 %
Lymphocytes Relative: 18 %
Lymphs Abs: 0.9 10*3/uL (ref 0.7–4.0)
MCH: 31 pg (ref 26.0–34.0)
MCHC: 33.5 g/dL (ref 30.0–36.0)
MCV: 92.6 fL (ref 80.0–100.0)
Monocytes Absolute: 0.3 10*3/uL (ref 0.1–1.0)
Monocytes Relative: 6 %
Neutro Abs: 3.9 10*3/uL (ref 1.7–7.7)
Neutrophils Relative %: 72 %
Platelets: 156 10*3/uL (ref 150–400)
RBC: 4.45 MIL/uL (ref 4.22–5.81)
RDW: 13 % (ref 11.5–15.5)
WBC: 5.3 10*3/uL (ref 4.0–10.5)
nRBC: 0 % (ref 0.0–0.2)

## 2022-09-07 ENCOUNTER — Ambulatory Visit (HOSPITAL_COMMUNITY): Payer: Medicare HMO | Admitting: Anesthesiology

## 2022-09-07 ENCOUNTER — Encounter (HOSPITAL_COMMUNITY)
Admission: RE | Disposition: A | Discharge status: Home / Self Care | Payer: Self-pay | Source: Home / Self Care | Attending: Internal Medicine

## 2022-09-07 ENCOUNTER — Ambulatory Visit (HOSPITAL_BASED_OUTPATIENT_CLINIC_OR_DEPARTMENT_OTHER): Payer: Medicare HMO | Admitting: Anesthesiology

## 2022-09-07 ENCOUNTER — Encounter (HOSPITAL_COMMUNITY): Payer: Self-pay

## 2022-09-07 ENCOUNTER — Ambulatory Visit: Payer: Medicare HMO | Admitting: Physician Assistant

## 2022-09-07 ENCOUNTER — Ambulatory Visit (HOSPITAL_COMMUNITY)
Admission: RE | Admit: 2022-09-07 | Discharge: 2022-09-07 | Disposition: A | Discharge status: Home / Self Care | Payer: Medicare HMO | Attending: Internal Medicine | Admitting: Internal Medicine

## 2022-09-07 DIAGNOSIS — K648 Other hemorrhoids: Secondary | ICD-10-CM | POA: Diagnosis not present

## 2022-09-07 DIAGNOSIS — K219 Gastro-esophageal reflux disease without esophagitis: Secondary | ICD-10-CM | POA: Insufficient documentation

## 2022-09-07 DIAGNOSIS — R1310 Dysphagia, unspecified: Secondary | ICD-10-CM

## 2022-09-07 DIAGNOSIS — I10 Essential (primary) hypertension: Secondary | ICD-10-CM | POA: Diagnosis not present

## 2022-09-07 DIAGNOSIS — Z87891 Personal history of nicotine dependence: Secondary | ICD-10-CM | POA: Insufficient documentation

## 2022-09-07 DIAGNOSIS — K635 Polyp of colon: Secondary | ICD-10-CM | POA: Diagnosis not present

## 2022-09-07 DIAGNOSIS — M1990 Unspecified osteoarthritis, unspecified site: Secondary | ICD-10-CM | POA: Insufficient documentation

## 2022-09-07 DIAGNOSIS — D123 Benign neoplasm of transverse colon: Secondary | ICD-10-CM | POA: Insufficient documentation

## 2022-09-07 DIAGNOSIS — F419 Anxiety disorder, unspecified: Secondary | ICD-10-CM | POA: Insufficient documentation

## 2022-09-07 DIAGNOSIS — D509 Iron deficiency anemia, unspecified: Secondary | ICD-10-CM | POA: Diagnosis not present

## 2022-09-07 DIAGNOSIS — B1920 Unspecified viral hepatitis C without hepatic coma: Secondary | ICD-10-CM | POA: Diagnosis not present

## 2022-09-07 DIAGNOSIS — K2970 Gastritis, unspecified, without bleeding: Secondary | ICD-10-CM | POA: Insufficient documentation

## 2022-09-07 DIAGNOSIS — K6389 Other specified diseases of intestine: Secondary | ICD-10-CM

## 2022-09-07 DIAGNOSIS — F32A Depression, unspecified: Secondary | ICD-10-CM | POA: Insufficient documentation

## 2022-09-07 DIAGNOSIS — D500 Iron deficiency anemia secondary to blood loss (chronic): Secondary | ICD-10-CM | POA: Diagnosis not present

## 2022-09-07 DIAGNOSIS — D128 Benign neoplasm of rectum: Secondary | ICD-10-CM

## 2022-09-07 DIAGNOSIS — R195 Other fecal abnormalities: Secondary | ICD-10-CM

## 2022-09-07 HISTORY — PX: ESOPHAGOGASTRODUODENOSCOPY (EGD) WITH PROPOFOL: SHX5813

## 2022-09-07 HISTORY — PX: COLONOSCOPY WITH PROPOFOL: SHX5780

## 2022-09-07 HISTORY — PX: BIOPSY: SHX5522

## 2022-09-07 HISTORY — PX: POLYPECTOMY: SHX5525

## 2022-09-07 SURGERY — COLONOSCOPY WITH PROPOFOL
Anesthesia: General

## 2022-09-07 MED ORDER — PROPOFOL 10 MG/ML IV BOLUS
INTRAVENOUS | Status: DC | PRN
  Administered 2022-09-07: 100 mg via INTRAVENOUS
  Administered 2022-09-07: 20 mg via INTRAVENOUS
  Administered 2022-09-07: 80 mg via INTRAVENOUS
  Administered 2022-09-07: 15 mg via INTRAVENOUS

## 2022-09-07 MED ORDER — PROPOFOL 500 MG/50ML IV EMUL
INTRAVENOUS | Status: DC | PRN
  Administered 2022-09-07: 150 ug/kg/min via INTRAVENOUS

## 2022-09-07 MED ORDER — LACTATED RINGERS IV SOLN: INTRAVENOUS | Status: DC

## 2022-09-07 MED ORDER — SUCRALFATE 1 G PO TABS: 1.0000 g | ORAL_TABLET | Freq: Four times a day (QID) | ORAL | 2 refills | Status: DC

## 2022-09-07 MED ORDER — LIDOCAINE HCL (CARDIAC) PF 100 MG/5ML IV SOSY
PREFILLED_SYRINGE | INTRAVENOUS | Status: DC | PRN
  Administered 2022-09-07: 50 mg via INTRAVENOUS

## 2022-09-07 NOTE — Anesthesia Postprocedure Evaluation (Signed)
Anesthesia Post Note  Patient: Marc [name]  Procedure(s) Performed: COLONOSCOPY WITH PROPOFOL ESOPHAGOGASTRODUODENOSCOPY (EGD) WITH PROPOFOL BIOPSY POLYPECTOMY  Patient location during evaluation: Phase II Anesthesia Type: General Level of consciousness: awake and alert and oriented Pain management: pain level controlled Vital Signs Assessment: post-procedure vital signs reviewed and stable Respiratory status: spontaneous breathing, nonlabored ventilation and respiratory function stable Cardiovascular status: blood pressure returned to baseline and stable Postop Assessment: no apparent nausea or vomiting Anesthetic complications: no  No notable events documented.   Last Vitals:  Vitals:   09/07/22 0753 09/07/22 0854  BP: (!) 150/76 (!) 115/57  Pulse: (!) 53 (!) 53  Resp: 15 18  Temp: 36.5 C (!) 36.4 C  SpO2: 100% 100%    Last Pain:  Vitals:   09/07/22 0859  TempSrc:   PainSc: 0-No pain                 Ethanjames Fontenot C Kassius Battiste

## 2022-09-07 NOTE — Transfer of Care (Signed)
Immediate Anesthesia Transfer of Care Note  Patient: Marc [name]  Procedure(s) Performed: COLONOSCOPY WITH PROPOFOL ESOPHAGOGASTRODUODENOSCOPY (EGD) WITH PROPOFOL BIOPSY POLYPECTOMY  Patient Location: Short Stay  Anesthesia Type:General  Level of Consciousness: drowsy  Airway & Oxygen Therapy: Patient Spontanous Breathing  Post-op Assessment: Report given to RN and Post -op Vital signs reviewed and stable  Post vital signs: Reviewed and stable  Last Vitals:  Vitals Value Taken Time  BP 115/57 09/07/22 0854  Temp 36.4 C 09/07/22 0854  Pulse 53 09/07/22 0854  Resp 18 09/07/22 0854  SpO2 100 % 09/07/22 0854    Last Pain:  Vitals:   09/07/22 0854  TempSrc: Oral  PainSc:          Complications: No notable events documented.

## 2022-09-07 NOTE — Op Note (Signed)
[facility] Patient Name: Marc [name] Procedure Date: 09/07/2022 8:15 AM MRN: [id number] Date of Birth: 1955-11-07 Attending MD: Elon Alas. Edgar Frisk, [id number] CSN: [id number] Age: 67 Admit Type: Outpatient Procedure:                Colonoscopy Indications:              Heme positive stool, Iron deficiency anemia Providers:                Elon Alas. Abbey Chatters, DO, Tammy Vaught, RN, Aram Candela Referring MD:              Medicines:                Monitored Anesthesia Care Complications:            No immediate complications. Estimated Blood Loss:     Estimated blood loss was minimal. Procedure:                Pre-Anesthesia Assessment:                           - The anesthesia plan was to use monitored                            anesthesia care (MAC).                           After obtaining informed consent, the colonoscope                            was passed under direct vision. Throughout the                            procedure, the patient's blood pressure, pulse, and                            oxygen saturations were monitored continuously. The                            [id number]-[id number] ([id number]) scope was introduced through                            the anus and advanced to the the cecum, identified                            by appendiceal orifice and ileocecal valve. The                            colonoscopy was performed without difficulty. The                            patient tolerated the procedure well. The quality                            of the bowel preparation was  evaluated using the                            BBPS [facility] Bowel Preparation Scale) with scores                            of: Right Colon = 3, Transverse Colon = 3 and Left                            Colon = 3 (entire mucosa seen well with no residual                            staining, small fragments of stool or opaque                            liquid). The total  BBPS score equals 9. Scope In: 8:35:34 AM Scope Out: 8:50:13 AM Scope Withdrawal Time: 0 hours 11 minutes 37 seconds  Total Procedure Duration: 0 hours 14 minutes 39 seconds  Findings:      The perianal and digital rectal examinations were normal.      Non-bleeding internal hemorrhoids were found during endoscopy.      A diffuse area of melanosis was found in the entire colon.      Three sessile polyps were found in the rectum and transverse colon. The       polyps were 3 to 6 mm in size. These polyps were removed with a cold       snare. Resection and retrieval were complete.      The exam was otherwise without abnormality. Impression:               - Non-bleeding internal hemorrhoids.                           - Melanosis in the colon.                           - Three 3 to 6 mm polyps in the rectum and in the                            transverse colon, removed with a cold snare.                            Resected and retrieved.                           - The examination was otherwise normal. Moderate Sedation:      Per Anesthesia Care Recommendation:           - Patient has a contact number available for                            emergencies. The signs and symptoms of potential                            delayed complications were discussed with the  patient. Return to normal activities tomorrow.                            Written discharge instructions were provided to the                            patient.                           - Resume previous diet.                           - Continue present medications.                           - Await pathology results.                           - Repeat colonoscopy in 5 years for surveillance.                           - Return to GI clinic in 3 months. Procedure Code(s):        --- Professional ---                           [phone], Colonoscopy, flexible; with removal of                             tumor(s), polyp(s), or other lesion(s) by snare                            technique Diagnosis Code(s):        --- Professional ---                           K63.89, Other specified diseases of intestine                           D12.8, Benign neoplasm of rectum                           D12.3, Benign neoplasm of transverse colon (hepatic                            flexure or splenic flexure)                           K64.8, Other hemorrhoids                           R19.5, Other fecal abnormalities                           D50.9, Iron deficiency anemia, unspecified CPT copyright 2022 American Medical Association. All rights reserved. The codes documented in this report are preliminary and upon coder review may  be revised to meet current compliance requirements. Elon Alas. Abbey Chatters, DO [facility] Abbey Chatters, DO  09/07/2022 8:57:58 AM This report has been signed electronically. Number of Addenda: 0

## 2022-09-07 NOTE — H&P (Signed)
Primary Care Physician:  Fayrene Helper, MD Primary Gastroenterologist:  Dr. Abbey Chatters  Pre-Procedure History & Physical: HPI:  Marc [name] is a 67 y.o. male is here for an EGD to be performed for GERD, dysphagia, and colonoscopy for iron deficiency anemia and heme positive stool   Past Medical History:  Diagnosis Date   Acute encephalopathy 12/29/2017   Hospitalized 5/22 to 5/23 with acute encephalopathy, unclear etiology   Acute gout involving toe of right foot 09/14/2019   Alcoholism ([place])    Allergy    Annual visit for general adult medical examination with abnormal findings 06/04/2020   Anxiety    Arthritis    Back problem    Cataract 01/2021   Chest wall pain 03/28/2017   Chronic hepatitis C without hepatic coma ([place]) 01/04/2009   Qualifier: Diagnosis of  By: Westly Pam. 2008: CT A/P NO CIRRHOSIS AUG 2013 MRCP- DILATED CBD/NO CIRRHOSIS DEC 2013 AST 99-102  ALT 145-156 PLT 147 HB 13.4 ALB 4.4-4.7 T BIL 0.7    Depression    Dilation of biliary tract 07/14/2012   EUS JAN 2014 BENIGN CBD DILATION    Epigastric pain 07/14/2012   GERD (gastroesophageal reflux disease)    Gout    HCV (hepatitis C virus) 06/16/2015   Hepatitis B    Hepatitis C 1979   Hepatitis C reactive    LIVER BX 2008-CHRONIC ACTIVE HEPATITIS   HTN (hypertension)    Hx of adenomatous colonic polyps 2008   due for surveillance 2017   Hypercholesterolemia    IBS (irritable bowel syndrome)    Iron deficiency anemia 03/13/2022   Irritable bowel syndrome 01/04/2009   Qualifier: Diagnosis of  By: Westly Pam.    Knee pain    Left forearm pain 08/15/2017   Leukopenia 91/63/8466   Lichen planus    Narcotic addiction ([place]) 06/10/2011   [facility]- Oct 2012    Normal cardiac stress test 05/25/2011   [facility] [place]   Normal echocardiogram 05/25/2011   EF 55%, mild TR   Oral lichen planus    Plaque psoriasis    RUQ pain 07/14/2012   Sjogren's disease  ([place])    Status post dilation of esophageal narrowing    Substance abuse ([place])    narcotic addiction   Thrombocytopenia ([place]) 11/06/2015   Trigger middle finger of left hand 06/20/2012    Past Surgical History:  Procedure Laterality Date   24 HOUR [place] STUDY  10/21/2020   Procedure: 24 HOUR [place] STUDY;  Surgeon: Mauri Pole, MD;  Location: [facility];  Service: Endoscopy;;   APPENDECTOMY     BALLOON DILATION N/A 05/28/2020   Procedure: [place];  Surgeon: Eloise Harman, DO;  [facility];  Service: Endoscopy;  Laterality: N/A;   BIOPSY  05/28/2020   Procedure: BIOPSY;  Surgeon: Eloise Harman, DO;  [facility];  Service: Endoscopy;;   CARPAL TUNNEL RELEASE     rt   COLONOSCOPY  2008 [facility] ARS D100 V8 PHEN 12.5   2 SIMPLE ADENOMAS (< 1 CM)   COLONOSCOPY WITH PROPOFOL N/A 12/05/2019   TI normal, 3 mm polyp in ascending colon, external and internal hemorrhoids. Polyp removed but not retrieved. Poor prep   ESOPHAGEAL MANOMETRY N/A 10/21/2020   Procedure: ESOPHAGEAL MANOMETRY (EM);  Surgeon: Mauri Pole, MD;  [facility]: [facility];  Service: Endoscopy;  Laterality: N/A;   ESOPHAGOGASTRODUODENOSCOPY  04/26/2012   Dr.  Fields: Non-erosive gastritis (inflammation) was found in the gastric antrum but no H.pylori; multiple biopsies (duodenal bx negative for Celiac)/ The mucosa of the esophagus appeared normal   ESOPHAGOGASTRODUODENOSCOPY  07/28/2021   Reports that his health; normal exam s/p esophageal dilation, esophageal biopsies obtained and were benign.   ESOPHAGOGASTRODUODENOSCOPY (EGD) WITH PROPOFOL N/A 12/05/2019   mild gastritis, duodenitis due to aspirin/NSAIDs.    ESOPHAGOGASTRODUODENOSCOPY (EGD) WITH PROPOFOL N/A 05/28/2020   Procedure: ESOPHAGOGASTRODUODENOSCOPY (EGD) WITH PROPOFOL;  Surgeon: Eloise Harman, DO;  [facility];  Service: Endoscopy;  Laterality: N/A;  2:30pm   EUS  09/08/2012   Dr. Ardis Hughs: CBD dilated  but no stones. Query secondary to Sphincter of Oddi stenosis, ?dysfunction, but clinically without symptoms   FRACTURE SURGERY N/A    [employer]reesia 08/25/2020   HARDWARE REMOVAL Right 02/12/2014   Procedure: HARDWARE REMOVAL;  Surgeon: Jolyn Nap, MD;  Location: [place];  Service: Orthopedics;  Laterality: Right;   KNEE ARTHROSCOPY     Neurostimulator implant     POLYPECTOMY  12/05/2019   Procedure: POLYPECTOMY;  Surgeon: Danie Binder, MD;  [facility];  Service: Endoscopy;;   right ring finger     spinal stenosis, had screws put in neck  04/11/2009   DR [place] Right 02/12/2014   Procedure: RIGHT ULNAR SHORTENING AND OSTEOTOMY ;  Surgeon: Jolyn Nap, MD;  Location: [place];  Service: Orthopedics;  Laterality: Right;   ULNAR NERVE TRANSPOSITION     UPPER GASTROINTESTINAL ENDOSCOPY  2008 SLF ABD PAIN WEIGHT LOSS d100 v8 phen 12.5   NL   WRIST SURGERY Right 08/2013   Dr. Edmonia Lynch    Prior to Admission medications   Medication Sig Start Date End Date Taking? Authorizing Provider  AMBULATORY NON FORMULARY MEDICATION Kratom Take 2 tablet by mouth twice daily   Yes [provider]  amLODipine (NORVASC) 2.5 MG tablet TAKE (1) TABLET BY MOUTH ONCE DAILY. 09/03/22  Yes Fayrene Helper, MD  aspirin EC 81 MG tablet Take 81 mg by mouth daily.   Yes [provider]  baclofen (LIORESAL) 10 MG tablet TAKE (1/2) TABLET ('5MG'$ ) BY MOUTH TWICE DAILY BEFORE A MEAL. 05/22/22  Yes Harper, Kristen S, PA-C  famotidine (PEPCID) 20 MG tablet TAKE 1 TABLET BY MOUTH TWICE DAILY. TAKE WITH BREAKFAST AND AT BEDTIME. 11/24/21  Yes Mahala Menghini, PA-C  fluticasone (FLONASE) 50 MCG/ACT nasal spray Place 1 spray into both nostrils daily as needed for allergies.   Yes [provider]  lubiprostone (AMITIZA) 24 MCG capsule TAKE 1 CAPSULE TWICE DAILY WITH MEALS. 03/13/22  Yes  Jodi Mourning, Kristen S, PA-C  RABEprazole (ACIPHEX) 20 MG tablet Take 20 mg by mouth daily. 09/30/21  Yes [provider]  AMBULATORY NON FORMULARY MEDICATION Lavena Stanford caffiene 1-2 tablet twice daily    [provider]  cevimeline (EVOXAC) 30 MG capsule Take 30 mg by mouth 2 (two) times daily. 08/28/22   [provider]  copper tablet Take 1 tablet (2 mg total) by mouth daily. 08/19/22   Harriett Rush, PA-C  ferrous sulfate 325 (65 FE) MG tablet Take 325 mg by mouth daily with breakfast.    [provider]  ibuprofen (ADVIL) 200 MG tablet Take 400 mg by mouth 2 (two) times daily.    [provider]  Multiple Vitamin (MULTIVITAMIN WITH MINERALS) TABS tablet  Take 1 tablet by mouth daily.    [provider]  Nutritional Supplements (KETO PO) Take 1 tablet by mouth in the morning, at noon, and at bedtime.     [provider]  nystatin (MYCOSTATIN) 100000 UNIT/ML suspension Take 15 mLs by mouth 4 (four) times daily. 08/21/22   [provider]  pilocarpine (SALAGEN) 5 MG tablet Take 5 mg by mouth 3 (three) times daily. 06/08/22   [provider]  senna (SENOKOT) 8.6 MG tablet Take 2 tablets by mouth 2 (two) times daily.    [provider]  Turmeric 400 MG CAPS Take 800 mg by mouth daily.     [provider]  Vitamin D3 (VITAMIN D) 25 MCG tablet Take 4,000 Units by mouth daily.     [provider]    Allergies as of 08/13/2022 - Review Complete 06/26/2022  Allergen Reaction Noted   Levofloxacin Nausea Only and Other (See Comments)    Ciprofloxacin  06/13/2015   Sulfa antibiotics  12/02/2012    Family History  Problem Relation Age of Onset   Bladder Cancer Mother        rare form    Cancer Sister        Metastatic abdominal   Emotional abuse Sister    Stroke Sister    Emotional abuse Sister    Heart failure Father    Stroke Father    Alcohol abuse Father    Dementia Paternal Aunt     Alcohol abuse Paternal Uncle    Dementia Paternal Uncle    Dementia Paternal Grandmother    Stroke Paternal Grandmother    ADD / ADHD Cousin    Bipolar disorder Cousin    Anxiety disorder Cousin    Depression Cousin        Committed suicide   Alcohol abuse Cousin    OCD Cousin    Paranoid behavior Cousin    Brain cancer Maternal Grandfather    Heart defect Other        FAMILY HX   Colon cancer Maternal Uncle    Colon cancer Cousin    Colon polyps Neg Hx    Drug abuse Neg Hx    Schizophrenia Neg Hx    Seizures Neg Hx    Sexual abuse Neg Hx    Physical abuse Neg Hx     Social History   Socioeconomic History   Marital status: Divorced    Spouse name: Not on file   Number of children: 1   Years of education: Not on file   Highest education level: Not on file  Occupational History   Occupation: Disabled/retired  Tobacco Use   Smoking status: Former    Packs/day: 1.50    Years: 8.00    Total pack years: 12.00    Types: Cigarettes    Quit date: 08/13/1986    Years since quitting: 36.0    Passive exposure: Current   Smokeless tobacco: Never   Tobacco comments:    quit 20 + yrs ago  Vaping Use   Vaping Use: Never used  Substance and Sexual Activity   Alcohol use: Not Currently    Comment: Used to drink heavily- went to treatment in May 2020. No alcohol in 3 years (03/13/2022)   Drug use: Not Currently    Comment: pain medications, klonopin- none in the last 17 months. Currently in [place]   Sexual activity: Not Currently  Other Topics Concern   Not on file  Social History  Narrative   Not on file   Social Determinants of Health   Financial Resource Strain: Low Risk  (03/09/2022)   Overall Financial Resource Strain (CARDIA)    Difficulty of Paying Living Expenses: Not hard at all  Food Insecurity: No Food Insecurity (03/09/2022)   Hunger Vital Sign    Worried About Running Out of Food in the Last Year: Never true    Ran Out of Food in the Last Year: Never true   Transportation Needs: No Transportation Needs (03/09/2022)   PRAPARE - Hydrologist (Medical): No    Lack of Transportation (Non-Medical): No  Physical Activity: Sufficiently Active (03/09/2022)   Exercise Vital Sign    Days of Exercise per Week: 7 days    Minutes of Exercise per Session: 150+ min  Stress: Stress Concern Present (03/09/2022)   [place]    Feeling of Stress : To some extent  Social Connections: Socially Isolated (03/09/2022)   Social Connection and Isolation Panel [NHANES]    Frequency of Communication with Friends and Family: More than three times a week    Frequency of Social Gatherings with Friends and Family: More than three times a week    Attends Religious Services: Never    Marine scientist or Organizations: No    Attends Archivist Meetings: Never    Marital Status: Divorced  Human resources officer Violence: Not At Risk (03/09/2022)   Humiliation, Afraid, Rape, and Kick questionnaire    Fear of Current or Ex-Partner: No    Emotionally Abused: No    Physically Abused: No    Sexually Abused: No    Review of Systems: General: Negative for fever, chills, fatigue, weakness. Eyes: Negative for vision changes.  ENT: Negative for hoarseness, difficulty swallowing , nasal congestion. CV: Negative for chest pain, angina, palpitations, dyspnea on exertion, peripheral edema.  Respiratory: Negative for dyspnea at rest, dyspnea on exertion, cough, sputum, wheezing.  GI: See history of present illness. GU:  Negative for dysuria, hematuria, urinary incontinence, urinary frequency, nocturnal urination.  MS: Negative for joint pain, low back pain.  Derm: Negative for rash or itching.  Neuro: Negative for weakness, abnormal sensation, seizure, frequent headaches, memory loss, confusion.  Psych: Negative for anxiety, depression Endo: Negative for unusual weight change.   Heme: Negative for bruising or bleeding. Allergy: Negative for rash or hives.  Physical Exam: Vital signs in last 24 hours: Temp:  [97.7 F (36.5 C)] 97.7 F (36.5 C) (01/29 0753) Pulse Rate:  [53] 53 (01/29 0753) Resp:  [15] 15 (01/29 0753) BP: (150)/(76) 150/76 (01/29 0753) SpO2:  [100 %] 100 % (01/29 0753) Weight:  [63 kg] 63 kg (01/29 0753)   General:   Alert,  Well-developed, well-nourished, pleasant and cooperative in NAD Head:  Normocephalic and atraumatic. Eyes:  Sclera clear, no icterus.   Conjunctiva pink. Ears:  Normal auditory acuity. Nose:  No deformity, discharge,  or lesions. Msk:  Symmetrical without gross deformities. Normal posture. Extremities:  Without clubbing or edema. Neurologic:  Alert and  oriented x4;  grossly normal neurologically. Skin:  Intact without significant lesions or rashes. Psych:  Alert and cooperative. Normal mood and affect.   Impression/Plan: Marc [name] is here for an EGD to be performed for GERD, dysphagia, and colonoscopy for iron deficiency anemia and heme positive stool   Risks, benefits, limitations, imponderables and alternatives regarding EGD have been reviewed with the patient. Questions have  been answered. All parties agreeable.

## 2022-09-07 NOTE — Anesthesia Preprocedure Evaluation (Signed)
Anesthesia Evaluation  Patient identified by MRN, date of birth, ID band Patient awake    Reviewed: Allergy & Precautions, H&P , NPO status , Patient's Chart, lab work & pertinent test results  Airway Mallampati: II  TM Distance: >3 FB Neck ROM: Full    Dental  (+) Dental Advisory Given, Missing   Pulmonary neg pulmonary ROS, former smoker   Pulmonary exam normal breath sounds clear to auscultation       Cardiovascular hypertension, Pt. on medications Normal cardiovascular exam Rhythm:Regular Rate:Normal     Neuro/Psych  PSYCHIATRIC DISORDERS Anxiety Depression    negative neurological ROS     GI/Hepatic ,GERD  Medicated,,(+)     substance abuse (last use  3 years ago)  alcohol use, Hepatitis -, C  Endo/Other  negative endocrine ROS    Renal/GU negative Renal ROS  negative genitourinary   Musculoskeletal  (+) Arthritis , Osteoarthritis,  narcotic dependent  Abdominal   Peds negative pediatric ROS (+)  Hematology  (+) Blood dyscrasia, anemia   Anesthesia Other Findings   Reproductive/Obstetrics negative OB ROS                             Anesthesia Physical Anesthesia Plan  ASA: 3  Anesthesia Plan: General   Post-op Pain Management: Minimal or no pain anticipated   Induction: Intravenous  PONV Risk Score and Plan: 1 and Propofol infusion  Airway Management Planned: Nasal Cannula and Natural Airway  Additional Equipment:   Intra-op Plan:   Post-operative Plan:   Informed Consent: I have reviewed the patients History and Physical, chart, labs and discussed the procedure including the risks, benefits and alternatives for the proposed anesthesia with the patient or authorized representative who has indicated his/her understanding and acceptance.     Dental advisory given  Plan Discussed with: CRNA and Surgeon  Anesthesia Plan Comments:        Anesthesia Quick  Evaluation

## 2022-09-07 NOTE — Op Note (Signed)
[facility] Patient Name: Marc [name] Procedure Date: 09/07/2022 8:16 AM MRN: [id number] Date of Birth: [birth year] Attending MD: Elon Alas. Abbey Chatters , [place], [id number] CSN: [id number] Age: 67 Admit Type: Outpatient Procedure:                Upper GI endoscopy Indications:              Dysphagia, Gastro-esophageal reflux disease Providers:                Elon Alas. Abbey Chatters, DO, Tammy Vaught, RN, Aram Candela Referring MD:              Medicines:                See the Anesthesia note for documentation of the                            administered medications Complications:            No immediate complications. Estimated Blood Loss:     Estimated blood loss was minimal. Procedure:                Pre-Anesthesia Assessment:                           - The anesthesia plan was to use monitored                            anesthesia care (MAC).                           After obtaining informed consent, the endoscope was                            passed under direct vision. Throughout the                            procedure, the patient's blood pressure, pulse, and                            oxygen saturations were monitored continuously. The                            [id number]-[id number] ([id number]) scope was introduced through the                            mouth, and advanced to the second part of duodenum.                            The upper GI endoscopy was accomplished without                            difficulty. The patient tolerated the procedure                            well. Scope In: 8:28:20  AM Scope Out: 8:31:51 AM Total Procedure Duration: 0 hours 3 minutes 31 seconds  Findings:      There is no endoscopic evidence of bleeding, areas of erosion,       esophagitis or ulcerations in the entire esophagus.      Patchy mild inflammation characterized by erythema was found in the       gastric body and in the gastric antrum. Biopsies were taken with a cold        forceps for Helicobacter pylori testing.      The duodenal bulb, first portion of the duodenum and second portion of       the duodenum were normal. Impression:               - Gastritis. Biopsied.                           - Normal duodenal bulb, first portion of the                            duodenum and second portion of the duodenum. Moderate Sedation:      Per Anesthesia Care Recommendation:           - Patient has a contact number available for                            emergencies. The signs and symptoms of potential                            delayed complications were discussed with the                            patient. Return to normal activities tomorrow.                            Written discharge instructions were provided to the                            patient.                           - Resume previous diet.                           - Continue present medications.                           - Await pathology results.                           - Use Aciphex (rabeprazole) 20 mg PO daily.                           - Use Pepcid (famotidine) 20 mg PO BID.                           - Return to GI clinic in 3 months. Procedure Code(s):        --- Professional ---  53976, Esophagogastroduodenoscopy, flexible,                            transoral; with biopsy, single or multiple Diagnosis Code(s):        --- Professional ---                           K29.70, Gastritis, unspecified, without bleeding                           R13.10, Dysphagia, unspecified                           K21.9, Gastro-esophageal reflux disease without                            esophagitis CPT copyright 2022 American Medical Association. All rights reserved. The codes documented in this report are preliminary and upon coder review may  be revised to meet current compliance requirements. Elon Alas. Abbey Chatters, DO [name], DO 09/07/2022 8:34:22 AM This  report has been signed electronically. Number of Addenda: 0

## 2022-09-07 NOTE — Discharge Instructions (Signed)
EGD Discharge instructions Please read the instructions outlined below and refer to this sheet in the next few weeks. These discharge instructions provide you with general information on caring for yourself after you leave the hospital. Your doctor may also give you specific instructions. While your treatment has been planned according to the most current medical practices available, unavoidable complications occasionally occur. If you have any problems or questions after discharge, please call your doctor. ACTIVITY You may resume your regular activity but move at a slower pace for the next 24 hours.  Take frequent rest periods for the next 24 hours.  Walking will help expel (get rid of) the air and reduce the bloated feeling in your abdomen.  No driving for 24 hours (because of the anesthesia (medicine) used during the test).  You may shower.  Do not sign any important legal documents or operate any machinery for 24 hours (because of the anesthesia used during the test).  NUTRITION Drink plenty of fluids.  You may resume your normal diet.  Begin with a light meal and progress to your normal diet.  Avoid alcoholic beverages for 24 hours or as instructed by your caregiver.  MEDICATIONS You may resume your normal medications unless your caregiver tells you otherwise.  WHAT YOU CAN EXPECT TODAY You may experience abdominal discomfort such as a feeling of fullness or "gas" pains.  FOLLOW-UP Your doctor will discuss the results of your test with you.  SEEK IMMEDIATE MEDICAL ATTENTION IF ANY OF THE FOLLOWING OCCUR: Excessive nausea (feeling sick to your stomach) and/or vomiting.  Severe abdominal pain and distention (swelling).  Trouble swallowing.  Temperature over 101 F (37.8 C).  Rectal bleeding or vomiting of blood.     Colonoscopy Discharge Instructions  Read the instructions outlined below and refer to this sheet in the next few weeks. These discharge instructions provide you with  general information on caring for yourself after you leave the hospital. Your doctor may also give you specific instructions. While your treatment has been planned according to the most current medical practices available, unavoidable complications occasionally occur.   ACTIVITY You may resume your regular activity, but move at a slower pace for the next 24 hours.  Take frequent rest periods for the next 24 hours.  Walking will help get rid of the air and reduce the bloated feeling in your belly (abdomen).  No driving for 24 hours (because of the medicine (anesthesia) used during the test).   Do not sign any important legal documents or operate any machinery for 24 hours (because of the anesthesia used during the test).  NUTRITION Drink plenty of fluids.  You may resume your normal diet as instructed by your doctor.  Begin with a light meal and progress to your normal diet. Heavy or fried foods are harder to digest and may make you feel sick to your stomach (nauseated).  Avoid alcoholic beverages for 24 hours or as instructed.  MEDICATIONS You may resume your normal medications unless your doctor tells you otherwise.  WHAT YOU CAN EXPECT TODAY Some feelings of bloating in the abdomen.  Passage of more gas than usual.  Spotting of blood in your stool or on the toilet paper.  IF YOU HAD POLYPS REMOVED DURING THE COLONOSCOPY: No aspirin products for 7 days or as instructed.  No alcohol for 7 days or as instructed.  Eat a soft diet for the next 24 hours.  FINDING OUT THE RESULTS OF YOUR TEST Not all test results are  available during your visit. If your test results are not back during the visit, make an appointment with your caregiver to find out the results. Do not assume everything is normal if you have not heard from your caregiver or the medical facility. It is important for you to follow up on all of your test results.  SEEK IMMEDIATE MEDICAL ATTENTION IF: You have more than a spotting of  blood in your stool.  Your belly is swollen (abdominal distention).  You are nauseated or vomiting.  You have a temperature over 101.  You have abdominal pain or discomfort that is severe or gets worse throughout the day.   Your EGD revealed mild amount inflammation in your stomach.  I took biopsies of this to rule out infection with a bacteria called H. pylori.  Await pathology results, my office will contact you.  Esophagus and small bowel appeared normal.  Continue on Aciphex and famotidine.  Your colonoscopy revealed 3 polyp(s) which I removed successfully. Await pathology results, my office will contact you. I recommend repeating colonoscopy in 5 years for surveillance purposes.   Follow-up with GI in 3 months.  I hope you have a great rest of your week!  Elon Alas. Abbey Chatters, D.O. Gastroenterology and Hepatology [facility]

## 2022-09-08 LAB — SURGICAL PATHOLOGY

## 2022-09-10 ENCOUNTER — Encounter (HOSPITAL_COMMUNITY): Payer: Self-pay | Admitting: Internal Medicine

## 2022-09-14 ENCOUNTER — Other Ambulatory Visit: Payer: Self-pay | Admitting: Internal Medicine

## 2022-09-14 DIAGNOSIS — K5900 Constipation, unspecified: Secondary | ICD-10-CM

## 2022-09-24 DIAGNOSIS — R682 Dry mouth, unspecified: Secondary | ICD-10-CM | POA: Diagnosis not present

## 2022-09-28 DIAGNOSIS — G629 Polyneuropathy, unspecified: Secondary | ICD-10-CM | POA: Diagnosis not present

## 2022-09-28 DIAGNOSIS — G2401 Drug induced subacute dyskinesia: Secondary | ICD-10-CM | POA: Diagnosis not present

## 2022-09-28 DIAGNOSIS — M792 Neuralgia and neuritis, unspecified: Secondary | ICD-10-CM | POA: Diagnosis not present

## 2022-10-07 ENCOUNTER — Other Ambulatory Visit: Payer: Self-pay | Admitting: Family Medicine

## 2022-10-07 ENCOUNTER — Other Ambulatory Visit: Payer: Self-pay | Admitting: Gastroenterology

## 2022-10-08 ENCOUNTER — Encounter: Payer: Self-pay | Admitting: Radiology

## 2022-10-20 ENCOUNTER — Encounter: Payer: Self-pay | Admitting: Gastroenterology

## 2022-10-21 ENCOUNTER — Other Ambulatory Visit: Payer: Self-pay | Admitting: Family Medicine

## 2022-10-23 DIAGNOSIS — M792 Neuralgia and neuritis, unspecified: Secondary | ICD-10-CM | POA: Diagnosis not present

## 2022-10-23 DIAGNOSIS — E1142 Type 2 diabetes mellitus with diabetic polyneuropathy: Secondary | ICD-10-CM | POA: Diagnosis not present

## 2022-10-23 DIAGNOSIS — G2401 Drug induced subacute dyskinesia: Secondary | ICD-10-CM | POA: Diagnosis not present

## 2022-10-28 ENCOUNTER — Ambulatory Visit (INDEPENDENT_AMBULATORY_CARE_PROVIDER_SITE_OTHER): Payer: Medicare HMO | Admitting: Family Medicine

## 2022-10-28 VITALS — BP 148/80 | HR 52 | Ht 69.0 in | Wt 139.1 lb

## 2022-10-28 DIAGNOSIS — F32A Depression, unspecified: Secondary | ICD-10-CM | POA: Diagnosis not present

## 2022-10-28 DIAGNOSIS — K219 Gastro-esophageal reflux disease without esophagitis: Secondary | ICD-10-CM

## 2022-10-28 DIAGNOSIS — H04123 Dry eye syndrome of bilateral lacrimal glands: Secondary | ICD-10-CM

## 2022-10-28 DIAGNOSIS — I10 Essential (primary) hypertension: Secondary | ICD-10-CM | POA: Diagnosis not present

## 2022-10-28 DIAGNOSIS — R109 Unspecified abdominal pain: Secondary | ICD-10-CM | POA: Diagnosis not present

## 2022-10-28 DIAGNOSIS — R682 Dry mouth, unspecified: Secondary | ICD-10-CM | POA: Diagnosis not present

## 2022-10-28 DIAGNOSIS — F419 Anxiety disorder, unspecified: Secondary | ICD-10-CM | POA: Diagnosis not present

## 2022-10-28 MED ORDER — DICYCLOMINE HCL 10 MG PO CAPS: ORAL_CAPSULE | ORAL | 0 refills | Status: DC

## 2022-10-28 NOTE — Patient Instructions (Addendum)
Annual exam in November , call if you need me sooner   Nurse BP check in 2 months, blood pressure today is elevaterd at [id number] systolic  Trial of limited medication for abdominal cramps and I will message Dr Abbey Chatters about this   Fasting lab order will be mailed to you for your November visit, needs PSA, chem 7 and EGFr and Vit d and fasting lipid nov 16 or  after but before 11/20 appt  Thanks for choosing [facility], we consider it a privelige to serve you.

## 2022-10-31 ENCOUNTER — Encounter: Payer: Self-pay | Admitting: Family Medicine

## 2022-10-31 ENCOUNTER — Telehealth: Payer: Self-pay | Admitting: Family Medicine

## 2022-10-31 DIAGNOSIS — R109 Unspecified abdominal pain: Secondary | ICD-10-CM | POA: Insufficient documentation

## 2022-10-31 NOTE — Assessment & Plan Note (Signed)
DASH diet and commitment to daily physical activity for a minimum of 30 minutes discussed and encouraged, as a part of hypertension management. The importance of attaining a healthy weight is also discussed.     10/28/2022    9:57 AM 10/28/2022    9:54 AM 09/07/2022    8:54 AM 09/07/2022    7:53 AM 09/03/2022   12:46 PM 08/19/2022    8:11 AM 06/24/2022   10:39 AM  BP/Weight  Systolic BP 123456 123456 [id number] 0000000  Diastolic BP 80 79 57 76 65 71 72  Wt. (Lbs)  139.12  138.89 138.8 138.8   BMI  20.54 kg/m2  20.51 kg/m2 20.5 kg/m2 20.5 kg/m2      Elevated at visit , normally controlled , nurse BP check in 2 months

## 2022-10-31 NOTE — Telephone Encounter (Signed)
Needs fsting lipid, chem 7 and EGGR prostae screen and vit D 11/16 or after but before 11/20 appt if possible, pls order and mail in June!

## 2022-10-31 NOTE — Assessment & Plan Note (Signed)
Treated by psych, not well controlled currently, medical complaints remain stressful

## 2022-10-31 NOTE — Progress Notes (Signed)
   Marc [name]     MRN: [id number]      DOB: October 09, 1955   HPI Marc [name] is here for follow up and re-evaluation of chronic medical conditions, medication management and review of any available recent lab and radiology data.  Preventive health is updated, specifically  Cancer screening and Immunization.   Now seeing Neurology re chronic dry mouth , not improving, miserable C/o excess cramping in stomach, delayed eating on day of visit so that is an additional problem The PT denies any adverse reactions to current medications since the last visit.     ROS Denies recent fever or chills. Denies sinus pressure, nasal congestion, ear pain or sore throat. Denies chest congestion, productive cough or wheezing. Denies chest pains, palpitations and leg swelling .   Denies dysuria, frequency, hesitancy or incontinence. Denies headaches, seizures, numbness, or tingling. Denies skin break down or rash.   PE  BP (!) 148/80 (BP Location: Right Arm, Patient Position: Sitting, Cuff Size: Large)   Pulse (!) 52   Ht 5\' 9"  (1.753 m)   Wt 139 lb 1.9 oz (63.1 kg)   SpO2 99%   BMI 20.54 kg/m   Patient alert and oriented and in no cardiopulmonary distress.in pain and uncomfortble  HEENT: No facial asymmetry, EOMI,     Neck supple .  Chest: Clear to auscultation bilaterally.  CVS: S1, S2 no murmurs, no S3.Regular rate.  r.   Ext: No edema  MS: Adequate ROM spine, shoulders, hips and knees.  Skin: Intact, no ulcerations or rash noted.  Psych: Good eye contact, normal affect. Memory intact not anxious or depressed appearing.  CNS: CN 2-12 intact, power,  normal throughout.no focal deficits noted.   Assessment & Plan  Essential hypertension DASH diet and commitment to daily physical activity for a minimum of 30 minutes discussed and encouraged, as a part of hypertension management. The importance of attaining a healthy weight is also discussed.     10/28/2022    9:57 AM 10/28/2022     9:54 AM 09/07/2022    8:54 AM 09/07/2022    7:53 AM 09/03/2022   12:46 PM 08/19/2022    8:11 AM 06/24/2022   10:39 AM  BP/Weight  Systolic BP 123456 123456 [id number] 0000000  Diastolic BP 80 79 57 76 65 71 72  Wt. (Lbs)  139.12  138.89 138.8 138.8   BMI  20.54 kg/m2  20.51 kg/m2 20.5 kg/m2 20.5 kg/m2      Elevated at visit , normally controlled , nurse BP check in 2 months  Dry mouth and eyes Persitently disabling has management at [facility]  Abdominal cramping Trial of limited amt of dicyclomine and GI to address will message his GI doc as pt has significant GI concerns and is already being managed by gI  Anxiety and depression Treated by psych, not well controlled currently, medical complaints remain stressful  Gastroesophageal reflux disease Treated by gI remains symptomatic on high dose med

## 2022-10-31 NOTE — Assessment & Plan Note (Addendum)
Persitently disabling has management at [facility]

## 2022-10-31 NOTE — Assessment & Plan Note (Signed)
Trial of limited amt of dicyclomine and GI to address will message his GI doc as pt has significant GI concerns and is already being managed by gI

## 2022-10-31 NOTE — Assessment & Plan Note (Signed)
Treated by gI remains symptomatic on high dose med

## 2022-11-03 ENCOUNTER — Other Ambulatory Visit: Payer: Self-pay

## 2022-11-03 NOTE — Telephone Encounter (Signed)
Noted will hold and mail in June

## 2022-11-13 ENCOUNTER — Other Ambulatory Visit: Payer: Self-pay | Admitting: Internal Medicine

## 2022-11-13 DIAGNOSIS — K219 Gastro-esophageal reflux disease without esophagitis: Secondary | ICD-10-CM

## 2022-12-09 ENCOUNTER — Other Ambulatory Visit: Payer: Self-pay | Admitting: Family Medicine

## 2022-12-21 ENCOUNTER — Other Ambulatory Visit: Payer: Self-pay | Admitting: Internal Medicine

## 2022-12-30 ENCOUNTER — Ambulatory Visit (INDEPENDENT_AMBULATORY_CARE_PROVIDER_SITE_OTHER): Payer: Medicare HMO

## 2022-12-30 VITALS — BP 146/74

## 2022-12-30 DIAGNOSIS — I10 Essential (primary) hypertension: Secondary | ICD-10-CM

## 2022-12-30 NOTE — Progress Notes (Signed)
Bp was 146/74. Please advise is any changes will be made

## 2022-12-30 NOTE — Addendum Note (Signed)
Addended by: Abner Greenspan on: 12/30/2022 10:24 AM   Modules accepted: Level of Service

## 2023-01-01 ENCOUNTER — Telehealth: Payer: Self-pay | Admitting: Family Medicine

## 2023-01-01 MED ORDER — AMLODIPINE BESYLATE 5 MG PO TABS: 5.0000 mg | ORAL_TABLET | Freq: Every day | ORAL | 2 refills | Status: DC

## 2023-01-01 NOTE — Addendum Note (Signed)
Addended by: Kerri Perches on: 01/01/2023 12:58 AM   Modules accepted: Orders

## 2023-01-01 NOTE — Telephone Encounter (Signed)
Ps call pt, advise him that he needs a higher dose of amlodipine to control BP. I have prescribed amlodipine 5 mg daily,starting today, new script at pharmacy. He is to STOP the 2.5 mg tablet once he gets this Also he needs an in office visit scheduled with me to f/u BP in next 6 to 8 weeks, this also needs to be scheduled while you have him on the phone please ?? Please ask!

## 2023-01-01 NOTE — Telephone Encounter (Signed)
Called patient, left VM.

## 2023-01-08 NOTE — Telephone Encounter (Signed)
Spoke with patient, he is scheduled with Dr. Lodema Hong on 7/23.

## 2023-01-25 DIAGNOSIS — R1033 Periumbilical pain: Secondary | ICD-10-CM | POA: Diagnosis not present

## 2023-01-25 DIAGNOSIS — R1312 Dysphagia, oropharyngeal phase: Secondary | ICD-10-CM | POA: Diagnosis not present

## 2023-01-25 DIAGNOSIS — K219 Gastro-esophageal reflux disease without esophagitis: Secondary | ICD-10-CM | POA: Diagnosis not present

## 2023-02-01 ENCOUNTER — Other Ambulatory Visit: Payer: Self-pay | Admitting: Internal Medicine

## 2023-02-01 DIAGNOSIS — K117 Disturbances of salivary secretion: Secondary | ICD-10-CM | POA: Diagnosis not present

## 2023-02-01 DIAGNOSIS — R682 Dry mouth, unspecified: Secondary | ICD-10-CM | POA: Diagnosis not present

## 2023-02-01 DIAGNOSIS — Q8719 Other congenital malformation syndromes predominantly associated with short stature: Secondary | ICD-10-CM | POA: Diagnosis not present

## 2023-02-03 DIAGNOSIS — M3500 Sicca syndrome, unspecified: Secondary | ICD-10-CM | POA: Diagnosis not present

## 2023-02-03 DIAGNOSIS — K117 Disturbances of salivary secretion: Secondary | ICD-10-CM | POA: Diagnosis not present

## 2023-02-16 DIAGNOSIS — I6523 Occlusion and stenosis of bilateral carotid arteries: Secondary | ICD-10-CM | POA: Diagnosis not present

## 2023-02-16 DIAGNOSIS — M3500 Sicca syndrome, unspecified: Secondary | ICD-10-CM | POA: Diagnosis not present

## 2023-02-17 ENCOUNTER — Inpatient Hospital Stay: Payer: Medicare HMO | Attending: Physician Assistant

## 2023-02-17 DIAGNOSIS — B1920 Unspecified viral hepatitis C without hepatic coma: Secondary | ICD-10-CM | POA: Diagnosis not present

## 2023-02-17 DIAGNOSIS — D72819 Decreased white blood cell count, unspecified: Secondary | ICD-10-CM | POA: Diagnosis not present

## 2023-02-17 DIAGNOSIS — Z809 Family history of malignant neoplasm, unspecified: Secondary | ICD-10-CM | POA: Diagnosis not present

## 2023-02-17 DIAGNOSIS — R6883 Chills (without fever): Secondary | ICD-10-CM | POA: Diagnosis not present

## 2023-02-17 DIAGNOSIS — I10 Essential (primary) hypertension: Secondary | ICD-10-CM | POA: Insufficient documentation

## 2023-02-17 DIAGNOSIS — M1990 Unspecified osteoarthritis, unspecified site: Secondary | ICD-10-CM | POA: Insufficient documentation

## 2023-02-17 DIAGNOSIS — Z8619 Personal history of other infectious and parasitic diseases: Secondary | ICD-10-CM | POA: Insufficient documentation

## 2023-02-17 DIAGNOSIS — F1021 Alcohol dependence, in remission: Secondary | ICD-10-CM | POA: Insufficient documentation

## 2023-02-17 DIAGNOSIS — K219 Gastro-esophageal reflux disease without esophagitis: Secondary | ICD-10-CM | POA: Diagnosis not present

## 2023-02-17 DIAGNOSIS — Z8719 Personal history of other diseases of the digestive system: Secondary | ICD-10-CM | POA: Insufficient documentation

## 2023-02-17 DIAGNOSIS — R61 Generalized hyperhidrosis: Secondary | ICD-10-CM | POA: Insufficient documentation

## 2023-02-17 DIAGNOSIS — D509 Iron deficiency anemia, unspecified: Secondary | ICD-10-CM | POA: Diagnosis not present

## 2023-02-17 DIAGNOSIS — D696 Thrombocytopenia, unspecified: Secondary | ICD-10-CM | POA: Insufficient documentation

## 2023-02-17 DIAGNOSIS — G629 Polyneuropathy, unspecified: Secondary | ICD-10-CM | POA: Insufficient documentation

## 2023-02-17 DIAGNOSIS — Z8616 Personal history of COVID-19: Secondary | ICD-10-CM | POA: Diagnosis not present

## 2023-02-17 DIAGNOSIS — G894 Chronic pain syndrome: Secondary | ICD-10-CM | POA: Insufficient documentation

## 2023-02-17 DIAGNOSIS — R5382 Chronic fatigue, unspecified: Secondary | ICD-10-CM | POA: Diagnosis not present

## 2023-02-17 DIAGNOSIS — Z800 Family history of malignant neoplasm of digestive organs: Secondary | ICD-10-CM | POA: Diagnosis not present

## 2023-02-17 DIAGNOSIS — D649 Anemia, unspecified: Secondary | ICD-10-CM

## 2023-02-17 DIAGNOSIS — E610 Copper deficiency: Secondary | ICD-10-CM | POA: Diagnosis not present

## 2023-02-17 LAB — CBC WITH DIFFERENTIAL/PLATELET
Abs Immature Granulocytes: 0.01 10*3/uL (ref 0.00–0.07)
Basophils Absolute: 0 10*3/uL (ref 0.0–0.1)
Basophils Relative: 0 %
Eosinophils Absolute: 0.4 10*3/uL (ref 0.0–0.5)
Eosinophils Relative: 5 %
HCT: 40.1 % (ref 39.0–52.0)
Hemoglobin: 13.4 g/dL (ref 13.0–17.0)
Immature Granulocytes: 0 %
Lymphocytes Relative: 21 %
Lymphs Abs: 1.5 10*3/uL (ref 0.7–4.0)
MCH: 30.2 pg (ref 26.0–34.0)
MCHC: 33.4 g/dL (ref 30.0–36.0)
MCV: 90.3 fL (ref 80.0–100.0)
Monocytes Absolute: 0.5 10*3/uL (ref 0.1–1.0)
Monocytes Relative: 7 %
Neutro Abs: 4.8 10*3/uL (ref 1.7–7.7)
Neutrophils Relative %: 67 %
Platelets: 172 10*3/uL (ref 150–400)
RBC: 4.44 MIL/uL (ref 4.22–5.81)
RDW: 12.9 % (ref 11.5–15.5)
WBC: 7.2 10*3/uL (ref 4.0–10.5)
nRBC: 0 % (ref 0.0–0.2)

## 2023-02-17 LAB — IRON AND TIBC
Iron: 46 ug/dL (ref 45–182)
Saturation Ratios: 14 % — ABNORMAL LOW (ref 17.9–39.5)
TIBC: 334 ug/dL (ref 250–450)
UIBC: 288 ug/dL

## 2023-02-17 LAB — FERRITIN: Ferritin: 27 ng/mL (ref 24–336)

## 2023-02-18 LAB — CERULOPLASMIN: Ceruloplasmin: 12.3 mg/dL — ABNORMAL LOW (ref 16.0–31.0)

## 2023-02-20 LAB — COPPER, SERUM: Copper: 52 ug/dL — ABNORMAL LOW (ref 69–132)

## 2023-03-02 ENCOUNTER — Ambulatory Visit: Payer: Medicare HMO | Admitting: Family Medicine

## 2023-03-02 ENCOUNTER — Encounter: Payer: Self-pay | Admitting: Family Medicine

## 2023-03-02 NOTE — Progress Notes (Unsigned)
VIRTUAL VISIT via TELEPHONE NOTE [facility]   I connected with Marc [name]  on 03/03/23 at  9:03 AM by telephone and verified that I am speaking with the correct person using two identifiers.  Location: Patient: Home Provider: [facility]   I discussed the limitations, risks, security and privacy concerns of performing an evaluation and management service by telephone and the availability of in person appointments. I also discussed with the patient that there may be a patient responsible charge related to this service. The patient expressed understanding and agreed to proceed.  REASON FOR VISIT:  Follow-up for abnormal immunofixation + leukopenia/thrombocytopenia + iron deficiency anemia  PRIOR THERAPY: None  CURRENT THERAPY: Oral iron supplementation  INTERVAL HISTORY:   Marc [name] 67 y.o. male returns for routine follow-up of his iron deficiency anemia, leukopenia, and thrombocytopenia.  He was last seen by Rojelio Brenner PA-C on 08/19/2022.  At today's visit, he reports feeling poorly following a recent COVID infection.  He continues to report the same constellation of symptoms (also seeing rheumatology) including chronic pain syndrome, generalized pruritus, dry/burning mouth, chills, night sweats, tinnitus, intermittent hearing loss, positional dizziness, and nondiabetic peripheral neuropathy.  No new bone pain, unexplained fevers, or unintentional weight loss.  No new masses or lymphadenopathy.  He continues to take ibuprofen once or twice daily despite being advised not to, since it is "the only thing that helps with his pain and inflammation."  He is taking iron tablet daily and copper 2 mg twice daily since January 2024.  He does take a daily multivitamin that contains 15 mg of zinc.  He has 60% energy and 75% appetite. He endorses that he is maintaining a stable weight.  REVIEW OF SYSTEMS:   Review of Systems  Constitutional:  Positive for  malaise/fatigue. Negative for chills, diaphoresis, fever and weight loss.  Respiratory:  Positive for cough. Negative for shortness of breath.   Cardiovascular:  Negative for chest pain and palpitations.  Gastrointestinal:  Positive for constipation. Negative for abdominal pain, blood in stool, melena, nausea and vomiting.  Neurological:  Positive for tingling, sensory change and headaches. Negative for dizziness.     PHYSICAL EXAM: (per limitations of virtual telephone visit)  The patient is alert and oriented x 3, exhibiting adequate mentation, good mood, and ability to speak in full sentences and execute sound judgement.  ASSESSMENT & PLAN:  1.  Abnormal SPEP/immunofixation - Patient seen at the request of his rheumatologist (Dr. Corliss Skains) for evaluation of abnormal immunofixation. - Labs from rheumatology: Immunofixation from 02/05/2022 showed poorly defined area of restricted protein mobility reactive with lambda light chain antisera.   Quantitative immunoglobulins were normal but on the lower limits of normal values.   SPEP negative for M spike, but was consistent with hypogammaglobulinemia (gamma globulin 0.7). - Hematology work-up (03/12/2022) NEGATIVE for any plasma cell dyscrasia SPEP unremarkable. Immunofixation negative. Mildly elevated kappa free light chain 24.2 with normal lambda 12.1 and mildly elevated ratio 2.0. CBC shows mild normocytic anemia with Hgb 12.7/MCV 91.0 CMP with normal creatinine 0.76.  (Transaminitis chronic/at baseline in the setting of hepatitis C and liver disease following with Dr. Marletta Lor) Mildly elevated LDH 193.  Normal beta-2 microglobulin.  - Constellation of symptoms (also seeing rheumatology) including chronic pain syndrome, chills, night sweats, tinnitus, intermittent hearing loss, positional dizziness, and nondiabetic peripheral neuropathy.   - No new bone pain, unexplained fevers, or unintentional weight loss. - PLAN: We will repeat MGUS/myeloma  panel  in 1 year to rule out any developing plasma cell dyscrasia - to be checked at next visit    2.  Thrombocytopenia and leukopenia - Previously seen at [facility] from 2017-2018 due to leukopenia and thrombocytopenia in the setting of hepatitis C and fatty liver disease, as well as elevated ferritin/iron saturation. - Nutritional workup showed copper deficiency (see below). - Most recent CBC/D (02/17/2023) shows normal WBC and platelets   3.  Copper deficiency - Nutritional panel (08/12/2022): Copper deficiency (62), normal B12 642, normal MMA, normal folate. - Taking copper 2 mg twice daily since January 2024  - Most recent labs (02/17/2023): Copper 52, ceruloplasmin 12.3 - PLAN: We will check 24-hour urinary copper to rule out Wilson's disease. - Increased copper as to 6 mg daily. - Instructed to STOP taking zinc-containing multivitamin - Check CBC/D, copper, zinc, and ceruloplasmin levels in 4 months.  4.  Iron deficiency anemia - Patient has history of elevated ferritin and iron saturation in the setting of liver disease, but most recent labs consistent with iron deficiency anemia. - Hemoccult stool positive x 3 (September 2023). - EGD (09/07/2022): Gastritis, mild.  No endoscopic evidence of bleeding. - Colonoscopy (09/07/2022): Nonbleeding internal hemorrhoids, melanosis coli, polyps x 3 (tubular adenoma x 1, hyperplastic polyp x 1, polypoid colonic mucosa x 1). - Denies bright red blood per rectum or melena - Takes ibuprofen twice daily - Has been taking iron tablet once daily since August 2023. - Admits to chronic fatigue.  Denies pica. - Labs (03/02/2023): Normal Hgb 13.4, but ongoing iron deficiency with ferritin 27, iron saturation 14% - PLAN: Recommend IV Venofer 300 mg x 2 due to symptomatic iron deficiency.  (Discussed potential side effects and risk of allergic reaction.  Patient is agreeable to proceed). - Continue oral iron with ferrous sulfate 325 mg daily  -  Advised to STOP all NSAID medications - Continue GI follow-up - We will repeat CBC and iron panel with RTC in 4 months   5.  Other history - PMH: Osteoarthritis, chronic pain syndrome, history of hepatitis C and transaminitis (follows with Dr. Marletta Lor at [facility]), GERD, recovering alcoholism in remission (quit drinking 2020), hypertension, prediabetes, history of colon polyps, anxiety/depression, vitamin D deficiency.  He follows with rheumatology due to positive ANA, sicca symptoms, constitutional symptoms, and arthritic pain (recently referred to [facility] for second opinion). - SOCIAL:  Lives alone and is functionally independent.  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 - SUBSTANCE: Past history of substance abuse, but has been sober for the past 3 years.  He reports prior history of daily heavy alcohol use on and off for about 45 years.  He reports previous heavy drug use including IV heroin, cocaine, MDMA, and opioid/benzodiazepine pills.  He worked very hard via Alcoholics Anonymous over the past 3 years. - FAMILY: No family history of multiple myeloma.  Mother deceased secondary to urachal bladder cancer.  Sister deceased secondary to metastatic stomach cancer.  Maternal grandmother with unspecified cancer.  Maternal grandfather with brain cancer.  PLAN SUMMARY: >> Venofer 300 mg x 2 >> Urinary copper, 24 hour >> Labs in 4 months = CBC/D, CMP, LDH, SPEP, light chains, immunofixation, ferritin, iron/TIBC, ceruloplasmin, copper >> OFFICE visit in 4 months (1 week after labs)     I discussed the assessment and treatment plan with the patient. The patient was provided an opportunity to ask questions and all were answered. The patient agreed with  the plan and demonstrated an understanding of the instructions.   The patient was advised to call back or seek an in-person evaluation if the symptoms worsen or if the condition fails to improve as  anticipated.  I provided 22 minutes of non-face-to-face time during this encounter.  CHUKWUKA FESTA, PA-C 03/03/2023 9:35 AM

## 2023-03-03 ENCOUNTER — Encounter: Payer: Self-pay | Admitting: Physician Assistant

## 2023-03-03 ENCOUNTER — Inpatient Hospital Stay (HOSPITAL_BASED_OUTPATIENT_CLINIC_OR_DEPARTMENT_OTHER): Payer: Medicare HMO | Admitting: Physician Assistant

## 2023-03-03 DIAGNOSIS — R778 Other specified abnormalities of plasma proteins: Secondary | ICD-10-CM

## 2023-03-03 DIAGNOSIS — D509 Iron deficiency anemia, unspecified: Secondary | ICD-10-CM

## 2023-03-03 DIAGNOSIS — E610 Copper deficiency: Secondary | ICD-10-CM

## 2023-03-03 DIAGNOSIS — G894 Chronic pain syndrome: Secondary | ICD-10-CM | POA: Diagnosis not present

## 2023-03-03 DIAGNOSIS — R5382 Chronic fatigue, unspecified: Secondary | ICD-10-CM | POA: Diagnosis not present

## 2023-03-03 DIAGNOSIS — D72819 Decreased white blood cell count, unspecified: Secondary | ICD-10-CM | POA: Diagnosis not present

## 2023-03-03 DIAGNOSIS — R6883 Chills (without fever): Secondary | ICD-10-CM | POA: Diagnosis not present

## 2023-03-03 DIAGNOSIS — R61 Generalized hyperhidrosis: Secondary | ICD-10-CM | POA: Diagnosis not present

## 2023-03-03 DIAGNOSIS — G629 Polyneuropathy, unspecified: Secondary | ICD-10-CM | POA: Diagnosis not present

## 2023-03-03 DIAGNOSIS — D696 Thrombocytopenia, unspecified: Secondary | ICD-10-CM | POA: Diagnosis not present

## 2023-03-03 MED ORDER — COPPER 2 MG PO TABS
6.0000 mg | Freq: Every day | 5 refills | Status: DC
Start: 2023-03-03 — End: 2023-11-17

## 2023-03-05 ENCOUNTER — Other Ambulatory Visit: Payer: Self-pay | Admitting: Family Medicine

## 2023-03-08 ENCOUNTER — Telehealth: Payer: Medicare HMO | Admitting: Family Medicine

## 2023-03-09 DIAGNOSIS — R1033 Periumbilical pain: Secondary | ICD-10-CM | POA: Diagnosis not present

## 2023-03-09 DIAGNOSIS — M3500 Sicca syndrome, unspecified: Secondary | ICD-10-CM | POA: Diagnosis not present

## 2023-03-12 ENCOUNTER — Inpatient Hospital Stay: Payer: Medicare HMO

## 2023-03-12 DIAGNOSIS — I771 Stricture of artery: Secondary | ICD-10-CM | POA: Diagnosis not present

## 2023-03-15 ENCOUNTER — Other Ambulatory Visit: Payer: Self-pay | Admitting: Gastroenterology

## 2023-03-19 ENCOUNTER — Inpatient Hospital Stay: Payer: Medicare HMO | Attending: Hematology

## 2023-03-19 VITALS — BP 141/75 | HR 61 | Temp 96.9°F | Resp 18

## 2023-03-19 DIAGNOSIS — Z79899 Other long term (current) drug therapy: Secondary | ICD-10-CM | POA: Insufficient documentation

## 2023-03-19 DIAGNOSIS — D509 Iron deficiency anemia, unspecified: Secondary | ICD-10-CM | POA: Diagnosis not present

## 2023-03-19 DIAGNOSIS — D500 Iron deficiency anemia secondary to blood loss (chronic): Secondary | ICD-10-CM

## 2023-03-19 MED ORDER — SODIUM CHLORIDE 0.9 % IV SOLN
300.0000 mg | Freq: Once | INTRAVENOUS | Status: AC
  Administered 2023-03-19: 300 mg via INTRAVENOUS
  Filled 2023-03-19: qty 300

## 2023-03-19 MED ORDER — CETIRIZINE HCL 10 MG PO TABS
10.0000 mg | ORAL_TABLET | Freq: Once | ORAL | Status: AC
  Administered 2023-03-19: 10 mg via ORAL
  Filled 2023-03-19: qty 1

## 2023-03-19 MED ORDER — ACETAMINOPHEN 325 MG PO TABS
650.0000 mg | ORAL_TABLET | Freq: Once | ORAL | Status: AC
  Administered 2023-03-19: 650 mg via ORAL
  Filled 2023-03-19: qty 2

## 2023-03-19 MED ORDER — SODIUM CHLORIDE 0.9 % IV SOLN: Freq: Once | INTRAVENOUS | Status: AC

## 2023-03-19 NOTE — Progress Notes (Signed)
Patient presents today for iron infusion. Patient is in satisfactory condition with no new complaints voiced.  Vital signs are stable.  We will proceed with infusion per provider orders.    Peripheral IV started with good blood return pre and post infusion.  Venofer 300 mg given today per MD orders. Tolerated infusion without adverse affects. Vital signs stable. No complaints at this time. Discharged from clinic ambulatory in stable condition. Alert and oriented x 3. F/U with [facility] as scheduled.   

## 2023-03-19 NOTE — Patient Instructions (Signed)
[facility]  Discharge Instructions: Thank you for choosing [facility] to provide your oncology and hematology care.  If you have a lab appointment with the Cancer Center - please note that after April 8th, 2024, all labs will be drawn in the cancer center.  You do not have to check in or register with the main entrance as you have in the past but will complete your check-in in the cancer center.  Wear comfortable clothing and clothing appropriate for easy access to any Portacath or PICC line.   We strive to give you quality time with your provider. You may need to reschedule your appointment if you arrive late (15 or more minutes).  Arriving late affects you and other patients whose appointments are after yours.  Also, if you miss three or more appointments without notifying the office, you may be dismissed from the clinic at the provider's discretion.      For prescription refill requests, have your pharmacy contact our office and allow 72 hours for refills to be completed.    Today you received Venofer IV iron infusion.  BELOW ARE SYMPTOMS THAT SHOULD BE REPORTED IMMEDIATELY: *FEVER GREATER THAN 100.4 F (38 C) OR HIGHER *CHILLS OR SWEATING *NAUSEA AND VOMITING THAT IS NOT CONTROLLED WITH YOUR NAUSEA MEDICATION *UNUSUAL SHORTNESS OF BREATH *UNUSUAL BRUISING OR BLEEDING *URINARY PROBLEMS (pain or burning when urinating, or frequent urination) *BOWEL PROBLEMS (unusual diarrhea, constipation, pain near the anus) TENDERNESS IN MOUTH AND THROAT WITH OR WITHOUT PRESENCE OF ULCERS (sore throat, sores in mouth, or a toothache) UNUSUAL RASH, SWELLING OR PAIN  UNUSUAL VAGINAL DISCHARGE OR ITCHING   Items with * indicate a potential emergency and should be followed up as soon as possible or go to the Emergency Department if any problems should occur.  Please show the CHEMOTHERAPY ALERT CARD or IMMUNOTHERAPY ALERT CARD at check-in to the Emergency Department and  triage nurse.  Should you have questions after your visit or need to cancel or reschedule your appointment, please contact [facility] [phone]  and follow the prompts.  Office hours are 8:00 a.m. to 4:30 p.m. Monday - Friday. Please note that voicemails left after 4:00 p.m. may not be returned until the following business day.  We are closed weekends and major holidays. You have access to a nurse at all times for urgent questions. Please call the main number to the clinic [phone] and follow the prompts.  For any non-urgent questions, you may also contact your provider using MyChart. We now offer e-Visits for anyone 18 and older to request care online for non-urgent symptoms. For details visit [email or web address].   Also download the MyChart app! Go to the app store, search "MyChart", open the app, select [facility], and log in with your MyChart username and password.   

## 2023-03-22 ENCOUNTER — Other Ambulatory Visit: Payer: Self-pay | Admitting: Internal Medicine

## 2023-03-22 DIAGNOSIS — K5900 Constipation, unspecified: Secondary | ICD-10-CM

## 2023-03-26 ENCOUNTER — Inpatient Hospital Stay: Payer: Medicare HMO

## 2023-03-26 VITALS — BP 129/71 | HR 55 | Temp 97.3°F | Resp 18 | Wt 145.2 lb

## 2023-03-26 DIAGNOSIS — D509 Iron deficiency anemia, unspecified: Secondary | ICD-10-CM | POA: Diagnosis not present

## 2023-03-26 DIAGNOSIS — D500 Iron deficiency anemia secondary to blood loss (chronic): Secondary | ICD-10-CM

## 2023-03-26 DIAGNOSIS — Z79899 Other long term (current) drug therapy: Secondary | ICD-10-CM | POA: Diagnosis not present

## 2023-03-26 MED ORDER — SODIUM CHLORIDE 0.9 % IV SOLN: Freq: Once | INTRAVENOUS | Status: AC

## 2023-03-26 MED ORDER — SODIUM CHLORIDE 0.9 % IV SOLN
300.0000 mg | Freq: Once | INTRAVENOUS | Status: AC
  Administered 2023-03-26: 300 mg via INTRAVENOUS
  Filled 2023-03-26: qty 300

## 2023-03-26 NOTE — Patient Instructions (Signed)
[facility]  Discharge Instructions: Thank you for choosing [facility] to provide your oncology and hematology care.  If you have a lab appointment with the Cancer Center - please note that after April 8th, 2024, all labs will be drawn in the cancer center.  You do not have to check in or register with the main entrance as you have in the past but will complete your check-in in the cancer center.  Wear comfortable clothing and clothing appropriate for easy access to any Portacath or PICC line.   We strive to give you quality time with your provider. You may need to reschedule your appointment if you arrive late (15 or more minutes).  Arriving late affects you and other patients whose appointments are after yours.  Also, if you miss three or more appointments without notifying the office, you may be dismissed from the clinic at the provider's discretion.      For prescription refill requests, have your pharmacy contact our office and allow 72 hours for refills to be completed.    Today you received the following iron infusion   To help prevent nausea and vomiting after your treatment, we encourage you to take your nausea medication as directed.  BELOW ARE SYMPTOMS THAT SHOULD BE REPORTED IMMEDIATELY: *FEVER GREATER THAN 100.4 F (38 C) OR HIGHER *CHILLS OR SWEATING *NAUSEA AND VOMITING THAT IS NOT CONTROLLED WITH YOUR NAUSEA MEDICATION *UNUSUAL SHORTNESS OF BREATH *UNUSUAL BRUISING OR BLEEDING *URINARY PROBLEMS (pain or burning when urinating, or frequent urination) *BOWEL PROBLEMS (unusual diarrhea, constipation, pain near the anus) TENDERNESS IN MOUTH AND THROAT WITH OR WITHOUT PRESENCE OF ULCERS (sore throat, sores in mouth, or a toothache) UNUSUAL RASH, SWELLING OR PAIN  UNUSUAL VAGINAL DISCHARGE OR ITCHING   Items with * indicate a potential emergency and should be followed up as soon as possible or go to the Emergency Department if any problems  should occur.  Please show the CHEMOTHERAPY ALERT CARD or IMMUNOTHERAPY ALERT CARD at check-in to the Emergency Department and triage nurse.  Should you have questions after your visit or need to cancel or reschedule your appointment, please contact [facility] [phone]  and follow the prompts.  Office hours are 8:00 a.m. to 4:30 p.m. Monday - Friday. Please note that voicemails left after 4:00 p.m. may not be returned until the following business day.  We are closed weekends and major holidays. You have access to a nurse at all times for urgent questions. Please call the main number to the clinic [phone] and follow the prompts.  For any non-urgent questions, you may also contact your provider using MyChart. We now offer e-Visits for anyone 18 and older to request care online for non-urgent symptoms. For details visit [email or web address].   Also download the MyChart app! Go to the app store, search "MyChart", open the app, select [facility], and log in with your MyChart username and password.   

## 2023-03-26 NOTE — Progress Notes (Signed)
Venofer iron infusion given per orders. Patient tolerated it well without problems. Vitals stable and discharged home from clinic ambulatory. Follow up as scheduled.

## 2023-03-30 ENCOUNTER — Other Ambulatory Visit: Payer: Self-pay

## 2023-03-30 DIAGNOSIS — D509 Iron deficiency anemia, unspecified: Secondary | ICD-10-CM | POA: Diagnosis not present

## 2023-03-30 DIAGNOSIS — E610 Copper deficiency: Secondary | ICD-10-CM

## 2023-03-30 DIAGNOSIS — Z79899 Other long term (current) drug therapy: Secondary | ICD-10-CM | POA: Diagnosis not present

## 2023-04-01 ENCOUNTER — Ambulatory Visit (INDEPENDENT_AMBULATORY_CARE_PROVIDER_SITE_OTHER): Payer: Medicare HMO

## 2023-04-01 VITALS — Ht 69.0 in | Wt 145.0 lb

## 2023-04-01 DIAGNOSIS — Z0000 Encounter for general adult medical examination without abnormal findings: Secondary | ICD-10-CM | POA: Diagnosis not present

## 2023-04-01 NOTE — Patient Instructions (Signed)
Marc [name] , Thank you for taking time to come for your Medicare Wellness Visit. I appreciate your ongoing commitment to your health goals. Please review the following plan we discussed and let me know if I can assist you in the future.   Referrals/Orders/Follow-Ups/Clinician Recommendations:  You are due for the vaccines checked below. You may have these done at your preferred pharmacy. Please have them fax the office proof of the vaccines so that we can update your chart.   [x]  Flu (due annually) [x]  Shingrix (Shingles vaccine) []  Pneumonia Vaccines [x]  TDAP (Tetanus) Vaccine every 10 years [x]  Covid-19 A new booster will be available in 6-8 weeks if you choose to get it. You can get this at your pharmacy   This is a list of the screening recommended for you and due dates:  Health Maintenance  Topic Date Due   Zoster (Shingles) Vaccine (1 of 2) 09/28/1974   COVID-19 Vaccine (3 - Moderna risk series) 07/25/2020   DTaP/Tdap/Td vaccine (2 - Td or Tdap) 12/23/2021   Flu Shot  08/09/2023*   Medicare Annual Wellness Visit  03/31/2024   Colon Cancer Screening  09/08/2027   Pneumonia Vaccine  Completed   Hepatitis C Screening  Completed   HPV Vaccine  Aged Out  *Topic was postponed. The date shown is not the original due date.    Advanced directives: (Declined) Advance directive discussed with you today. Even though you declined this today, please call our office should you change your mind, and we can give you the proper paperwork for you to fill out.  Next Medicare Annual Wellness Visit scheduled for next year: Yes  Preventive Care 79 Years and Older, Male Preventive care refers to lifestyle choices and visits with your health care provider that can promote health and wellness. Preventive care visits are also called wellness exams. What can I expect for my preventive care visit? Counseling During your preventive care visit, your health care provider may ask about your: Medical history,  including: Past medical problems. Family medical history. History of falls. Current health, including: Emotional well-being. Home life and relationship well-being. Sexual activity. Memory and ability to understand (cognition). Lifestyle, including: Alcohol, nicotine or tobacco, and drug use. Access to firearms. Diet, exercise, and sleep habits. Work and work Astronomer. Sunscreen use. Safety issues such as seatbelt and bike helmet use. Physical exam Your health care provider will check your: Height and weight. These may be used to calculate your BMI (body mass index). BMI is a measurement that tells if you are at a healthy weight. Waist circumference. This measures the distance around your waistline. This measurement also tells if you are at a healthy weight and may help predict your risk of certain diseases, such as type 2 diabetes and high blood pressure. Heart rate and blood pressure. Body temperature. Skin for abnormal spots. What immunizations do I need?  Vaccines are usually given at various ages, according to a schedule. Your health care provider will recommend vaccines for you based on your age, medical history, and lifestyle or other factors, such as travel or where you work. What tests do I need? Screening Your health care provider may recommend screening tests for certain conditions. This may include: Lipid and cholesterol levels. Diabetes screening. This is done by checking your blood sugar (glucose) after you have not eaten for a while (fasting). Hepatitis C test. Hepatitis B test. HIV (human immunodeficiency virus) test. STI (sexually transmitted infection) testing, if you are at risk. Lung cancer screening.  Colorectal cancer screening. Prostate cancer screening. Abdominal aortic aneurysm (AAA) screening. You may need this if you are a current or former smoker. Talk with your health care provider about your test results, treatment options, and if necessary, the  need for more tests. Follow these instructions at home: Eating and drinking  Eat a diet that includes fresh fruits and vegetables, whole grains, lean protein, and low-fat dairy products. Limit your intake of foods with high amounts of sugar, saturated fats, and salt. Take vitamin and mineral supplements as recommended by your health care provider. Do not drink alcohol if your health care provider tells you not to drink. If you drink alcohol: Limit how much you have to 0-2 drinks a day. Know how much alcohol is in your drink. In the [place] one drink equals one 12 oz bottle of beer (355 mL), one 5 oz glass of wine (148 mL), or one 1 oz glass of hard liquor (44 mL). Lifestyle Brush your teeth every morning and night with fluoride toothpaste. Floss one time each day. Exercise for at least 30 minutes 5 or more days each week. Do not use any products that contain nicotine or tobacco. These products include cigarettes, chewing tobacco, and vaping devices, such as e-cigarettes. If you need help quitting, ask your health care provider. Do not use drugs. If you are sexually active, practice safe sex. Use a condom or other form of protection to prevent STIs. Take aspirin only as told by your health care provider. Make sure that you understand how much to take and what form to take. Work with your health care provider to find out whether it is safe and beneficial for you to take aspirin daily. Ask your health care provider if you need to take a cholesterol-lowering medicine (statin). Find healthy ways to manage stress, such as: Meditation, yoga, or listening to music. Journaling. Talking to a trusted person. Spending time with friends and family. Safety Always wear your seat belt while driving or riding in a vehicle. Do not drive: If you have been drinking alcohol. Do not ride with someone who has been drinking. When you are tired or distracted. While texting. If you have been using any  mind-altering substances or drugs. Wear a helmet and other protective equipment during sports activities. If you have firearms in your house, make sure you follow all gun safety procedures. Minimize exposure to UV radiation to reduce your risk of skin cancer. What's next? Visit your health care provider once a year for an annual wellness visit. Ask your health care provider how often you should have your eyes and teeth checked. Stay up to date on all vaccines. This information is not intended to replace advice given to you by your health care provider. Make sure you discuss any questions you have with your health care provider. Document Revised: 01/22/2021 Document Reviewed: 01/22/2021 Elsevier Patient Education  2024 [employer]. Understanding Your Risk for Falls Millions of people have serious injuries from falls each year. It is important to understand your risk of falling. Talk with your health care provider about your risk and what you can do to lower it. If you do have a serious fall, make sure to tell your provider. Falling once raises your risk of falling again. How can falls affect me? Serious injuries from falls are common. These include: Broken bones, such as hip fractures. Head injuries, such as traumatic brain injuries (TBI) or concussions. A fear of falling can cause you to avoid activities and  stay at home. This can make your muscles weaker and raise your risk for a fall. What can increase my risk? There are a number of risk factors that increase your risk for falling. The more risk factors you have, the higher your risk of falling. Serious injuries from a fall happen most often to people who are older than 69 years old. Teenagers and young adults ages 26-29 are also at higher risk. Common risk factors include: Weakness in the lower body. Being generally weak or confused due to long-term (chronic) illness. Dizziness or balance problems. Poor vision. Medicines that cause  dizziness or drowsiness. These may include: Medicines for your blood pressure, heart, anxiety, insomnia, or swelling (edema). Pain medicines. Muscle relaxants. Other risk factors include: Drinking alcohol. Having had a fall in the past. Having foot pain or wearing improper footwear. Working at a dangerous job. Having any of the following in your home: Tripping hazards, such as floor clutter or loose rugs. Poor lighting. Pets. Having dementia or memory loss. What actions can I take to lower my risk of falling?     Physical activity Stay physically fit. Do strength and balance exercises. Consider taking a regular class to build strength and balance. Yoga and tai chi are good options. Vision Have your eyes checked every year and your prescription for glasses or contacts updated as needed. Shoes and walking aids Wear non-skid shoes. Wear shoes that have rubber soles and low heels. Do not wear high heels. Do not walk around the house in socks or slippers. Use a cane or walker as told by your provider. Home safety Attach secure railings on both sides of your stairs. Install grab bars for your bathtub, shower, and toilet. Use a non-skid mat in your bathtub or shower. Attach bath mats securely with double-sided, non-slip rug tape. Use good lighting in all rooms. Keep a flashlight near your bed. Make sure there is a clear path from your bed to the bathroom. Use night-lights. Do not use throw rugs. Make sure all carpeting is taped or tacked down securely. Remove all clutter from walkways and stairways, including extension cords. Repair uneven or broken steps and floors. Avoid walking on icy or slippery surfaces. Walk on the grass instead of on icy or slick sidewalks. Use ice melter to get rid of ice on walkways in the winter. Use a cordless phone. Questions to ask your health care provider Can you help me check my risk for a fall? Do any of my medicines make me more likely to  fall? Should I take a vitamin D supplement? What exercises can I do to improve my strength and balance? Should I make an appointment to have my vision checked? Do I need a bone density test to check for weak bones (osteoporosis)? Would it help to use a cane or a walker? Where to find more information Centers for Disease Control and Prevention, [employer]: TonerPromos.no Community-Based Fall Prevention Programs: TonerPromos.no [employer] on Aging: BaseRingTones.pl Contact a health care provider if: You fall at home. You are afraid of falling at home. You feel weak, drowsy, or dizzy. This information is not intended to replace advice given to you by your health care provider. Make sure you discuss any questions you have with your health care provider. Document Revised: 03/30/2022 Document Reviewed: 03/30/2022 Elsevier Patient Education  2024 [employer].

## 2023-04-01 NOTE — Progress Notes (Signed)
 Because this visit was a virtual/telehealth visit,  certain criteria was not obtained, such a blood pressure, CBG if patient is a diabetic, and timed get up and go. Any medications not marked as "taking" was not mentioned during the medication reconciliation part of the visit. Any vitals not documented were not able to be obtained due to this being a telehealth visit. Vitals that have been documented are verbally provided by the patient.  Patient was unable to self-report a recent blood pressure reading due to a lack of equipment at home via telehealth.  Subjective:   Marc [name] is a 67 y.o. male who presents for Medicare Annual/Subsequent preventive examination.  Visit Complete: Virtual  I connected with  Marc [name] on 04/01/23 by a audio enabled telemedicine application and verified that I am speaking with the correct person using two identifiers.  Patient Location: Home  Provider Location: Home Office  I discussed the limitations of evaluation and management by telemedicine. The patient expressed understanding and agreed to proceed.  Patient Medicare AWV questionnaire was completed by the patient on 03/29/2023; I have confirmed that all information answered by patient is correct and no changes since this date.  Review of Systems     Cardiac Risk Factors include: advanced age (>8men, >80 women);dyslipidemia;hypertension;male gender     Objective:    Today's Vitals   03/29/23 0242 04/01/23 1614  Weight:  145 lb (65.8 kg)  Height:  5\' 9"  (1.753 m)  PainSc: 0-No pain    Body mass index is 21.41 kg/m.     04/01/2023    4:16 PM 03/19/2023    1:55 PM 03/03/2023    8:38 AM 09/03/2022    1:22 PM 08/19/2022    8:11 AM 04/09/2022   10:37 AM 03/12/2022    8:14 AM  Advanced Directives  Does Patient Have a Medical Advance Directive? No No No No No No No  Would patient like information on creating a medical advance directive? No - Patient declined No - Patient declined No - Patient  declined No - Patient declined No - Patient declined No - Patient declined Yes (MAU/Ambulatory/Procedural Areas - Information given)    Current Medications (verified) Outpatient Encounter Medications as of 04/01/2023  Medication Sig   AMBULATORY NON FORMULARY MEDICATION Kratom Take 2 tablet by mouth twice daily   AMBULATORY NON FORMULARY MEDICATION Friska caffiene 1-2 tablet twice daily   amLODipine (NORVASC) 5 MG tablet TAKE ONE TABLET BY MOUTH ONCE DAILY.   aspirin EC 81 MG tablet Take 81 mg by mouth daily.   baclofen (LIORESAL) 10 MG tablet TAKE (1/2) TABLET (5MG ) BY MOUTH TWICE DAILY BEFORE A MEAL.   cevimeline (EVOXAC) 30 MG capsule Take 30 mg by mouth 2 (two) times daily.   copper tablet Take 3 tablets (6 mg total) by mouth daily.   dicyclomine (BENTYL) 10 MG capsule Take one capsule by mouth two tmes daily, as needed, for abdominal cramps   famotidine (PEPCID) 20 MG tablet TAKE 1 TABLET BY MOUTH TWICE DAILY. TAKE WITH BREAKFAST AND AT BEDTIME.   ferrous sulfate 325 (65 FE) MG tablet Take 325 mg by mouth daily with breakfast.   fluticasone (FLONASE) 50 MCG/ACT nasal spray Place 1 spray into both nostrils daily as needed for allergies.   gabapentin (NEURONTIN) 100 MG capsule Take by mouth.   ibuprofen (ADVIL) 200 MG tablet Take 400 mg by mouth 2 (two) times daily.   lubiprostone (AMITIZA) 24 MCG capsule TAKE 1 CAPSULE TWICE DAILY WITH  MEALS.   Multiple Vitamin (MULTIVITAMIN WITH MINERALS) TABS tablet Take 1 tablet by mouth daily.   Nutritional Supplements (KETO PO) Take 1 tablet by mouth in the morning, at noon, and at bedtime.    pilocarpine (SALAGEN) 5 MG tablet Take 5 mg by mouth 3 (three) times daily.   RABEprazole (ACIPHEX) 20 MG tablet Take 20 mg by mouth daily.   senna (SENOKOT) 8.6 MG tablet Take 2 tablets by mouth 2 (two) times daily.   sucralfate (CARAFATE) 1 g tablet TAKE 1 TABLET BY MOUTH 4 TIMES DAILY   Turmeric 400 MG CAPS Take 800 mg by mouth daily.    Vitamin D3  (VITAMIN D) 25 MCG tablet Take 4,000 Units by mouth daily.    No facility-administered encounter medications on file as of 04/01/2023.    Allergies (verified) Levofloxacin, Ciprofloxacin, and Sulfa antibiotics   History: Past Medical History:  Diagnosis Date   Acute encephalopathy 12/29/2017   Hospitalized 5/22 to 5/23 with acute encephalopathy, unclear etiology   Acute gout involving toe of right foot 09/14/2019   Alcoholism (HCC)    Allergy    Annual visit for general adult medical examination with abnormal findings 06/04/2020   Anxiety    Arthritis    Back problem    Cataract 01/2021   Chest wall pain 03/28/2017   Chronic hepatitis C without hepatic coma (HCC) 01/04/2009   Qualifier: Diagnosis of  By: De Blanch. 2008: CT A/P NO CIRRHOSIS AUG 2013 MRCP- DILATED CBD/NO CIRRHOSIS DEC 2013 AST 99-102  ALT 145-156 PLT 147 HB 13.4 ALB 4.4-4.7 T BIL 0.7    Depression    Dilation of biliary tract 07/14/2012   EUS JAN 2014 BENIGN CBD DILATION    Epigastric pain 07/14/2012   GERD (gastroesophageal reflux disease)    Gout    HCV (hepatitis C virus) 06/16/2015   Hepatitis B    Hepatitis C 1979   Hepatitis C reactive    LIVER BX 2008-CHRONIC ACTIVE HEPATITIS   HTN (hypertension)    Hx of adenomatous colonic polyps 2008   due for surveillance 2017   Hypercholesterolemia    IBS (irritable bowel syndrome)    Iron deficiency anemia 03/13/2022   Irritable bowel syndrome 01/04/2009   Qualifier: Diagnosis of  By: De Blanch.    Knee pain    Left forearm pain 08/15/2017   Leukopenia 11/06/2015   Lichen planus    Narcotic addiction (HCC) 06/10/2011   [facility]- Oct 2012    Normal cardiac stress test 05/25/2011   [facility] [place]   Normal echocardiogram 05/25/2011   EF 55%, mild TR   Oral lichen planus    Plaque psoriasis    RUQ pain 07/14/2012   Sjogren's disease (HCC)    Status post dilation of esophageal narrowing     Substance abuse (HCC)    narcotic addiction   Thrombocytopenia (HCC) 11/06/2015   Trigger middle finger of left hand 06/20/2012   Past Surgical History:  Procedure Laterality Date   24 HOUR PH STUDY  10/21/2020   Procedure: 24 HOUR PH STUDY;  Surgeon: Napoleon Form, MD;  Location: [facility];  Service: Endoscopy;;   APPENDECTOMY     BALLOON DILATION N/A 05/28/2020   Procedure: [place];  Surgeon: Lanelle Bal, DO;  [facility];  Service: Endoscopy;  Laterality: N/A;   BIOPSY  05/28/2020   Procedure: BIOPSY;  Surgeon: Lanelle Bal, DO;  [facility];  Service: Endoscopy;;   BIOPSY  09/07/2022   Procedure: BIOPSY;  Surgeon: Lanelle Bal, DO;  [facility];  Service: Endoscopy;;   CARPAL TUNNEL RELEASE     rt   COLONOSCOPY  2008 [facility] ARS D100 V8 PHEN 12.5   2 SIMPLE ADENOMAS (< 1 CM)   COLONOSCOPY WITH PROPOFOL N/A 12/05/2019   TI normal, 3 mm polyp in ascending colon, external and internal hemorrhoids. Polyp removed but not retrieved. Poor prep   COLONOSCOPY WITH PROPOFOL N/A 09/07/2022   Procedure: COLONOSCOPY WITH PROPOFOL;  Surgeon: Lanelle Bal, DO;  [facility];  Service: Endoscopy;  Laterality: N/A;  9:15 AM   ESOPHAGEAL MANOMETRY N/A 10/21/2020   Procedure: ESOPHAGEAL MANOMETRY (EM);  Surgeon: Napoleon Form, MD;  Location: [facility];  Service: Endoscopy;  Laterality: N/A;   ESOPHAGOGASTRODUODENOSCOPY  04/26/2012   Dr. Darrick Penna: Non-erosive gastritis (inflammation) was found in the gastric antrum but no H.pylori; multiple biopsies (duodenal bx negative for Celiac)/ The mucosa of the esophagus appeared normal   ESOPHAGOGASTRODUODENOSCOPY  07/28/2021   Reports that his health; normal exam s/p esophageal dilation, esophageal biopsies obtained and were benign.   ESOPHAGOGASTRODUODENOSCOPY (EGD) WITH PROPOFOL N/A 12/05/2019   mild gastritis, duodenitis due to aspirin/NSAIDs.     ESOPHAGOGASTRODUODENOSCOPY (EGD) WITH PROPOFOL N/A 05/28/2020   Procedure: ESOPHAGOGASTRODUODENOSCOPY (EGD) WITH PROPOFOL;  Surgeon: Lanelle Bal, DO;  [facility];  Service: Endoscopy;  Laterality: N/A;  2:30pm   ESOPHAGOGASTRODUODENOSCOPY (EGD) WITH PROPOFOL N/A 09/07/2022   Procedure: ESOPHAGOGASTRODUODENOSCOPY (EGD) WITH PROPOFOL;  Surgeon: Lanelle Bal, DO;  [facility];  Service: Endoscopy;  Laterality: N/A;   EUS  09/08/2012   Dr. Christella Hartigan: CBD dilated but no stones. Query secondary to Sphincter of Oddi stenosis, ?dysfunction, but clinically without symptoms   FRACTURE SURGERY N/A    Phreesia 08/25/2020   HARDWARE REMOVAL Right 02/12/2014   Procedure: HARDWARE REMOVAL;  Surgeon: Jodi Marble, MD;  Location: [facility];  Service: Orthopedics;  Laterality: Right;   KNEE ARTHROSCOPY     Neurostimulator implant     POLYPECTOMY  12/05/2019   Procedure: POLYPECTOMY;  Surgeon: West Bali, MD;  [facility];  Service: Endoscopy;;   POLYPECTOMY  09/07/2022   Procedure: POLYPECTOMY;  Surgeon: Lanelle Bal, DO;  [facility];  Service: Endoscopy;;   right ring finger     spinal stenosis, had screws put in neck  04/11/2009   DR KRITZER   SPINE SURGERY     TONSILLECTOMY     ULNA OSTEOTOMY Right 02/12/2014   Procedure: RIGHT ULNAR SHORTENING AND OSTEOTOMY ;  Surgeon: Jodi Marble, MD;  Location: [facility];  Service: Orthopedics;  Laterality: Right;   ULNAR NERVE TRANSPOSITION     UPPER GASTROINTESTINAL ENDOSCOPY  2008 [facility]F ABD PAIN WEIGHT LOSS d100 v8 phen 12.5   NL   WRIST SURGERY Right 08/2013   Dr. Margarita Rana   Family History  Problem Relation Age of Onset   Bladder Cancer Mother        rare form    Cancer Sister        Metastatic abdominal   Emotional abuse Sister    Stroke Sister    Emotional abuse Sister    Heart failure Father    Stroke Father    Alcohol abuse Father     Dementia Paternal Aunt    Alcohol abuse Paternal Uncle  Dementia Paternal Uncle    Dementia Paternal Grandmother    Stroke Paternal Grandmother    ADD / ADHD Cousin    Bipolar disorder Cousin    Anxiety disorder Cousin    Depression Cousin        Committed suicide   Alcohol abuse Cousin    OCD Cousin    Paranoid behavior Cousin    Brain cancer Maternal Grandfather    Heart defect Other        FAMILY HX   Colon cancer Maternal Uncle    Colon cancer Cousin    Colon polyps Neg Hx    Drug abuse Neg Hx    Schizophrenia Neg Hx    Seizures Neg Hx    Sexual abuse Neg Hx    Physical abuse Neg Hx    Social History   Socioeconomic History   Marital status: Divorced    Spouse name: Not on file   Number of children: 1   Years of education: Not on file   Highest education level: Associate degree: occupational, Scientist, product/process development, or vocational program  Occupational History   Occupation: Disabled/retired  Tobacco Use   Smoking status: Former    Current packs/day: 0.00    Average packs/day: 1.5 packs/day for 8.0 years (12.0 ttl pk-yrs)    Types: Cigarettes    Start date: 08/13/1978    Quit date: 08/13/1986    Years since quitting: 36.6    Passive exposure: Current   Smokeless tobacco: Never   Tobacco comments:    quit 20 + yrs ago  Vaping Use   Vaping status: Never Used  Substance and Sexual Activity   Alcohol use: Not Currently    Comment: Used to drink heavily- went to treatment in May 2020. No alcohol in 3 years (03/13/2022)   Drug use: Not Currently    Comment: pain medications, klonopin- none in the last 17 months. Currently in AA   Sexual activity: Not Currently  Other Topics Concern   Not on file  Social History Narrative   Not on file   Social Determinants of Health   Financial Resource Strain: Low Risk  (03/29/2023)   Overall Financial Resource Strain (CARDIA)    Difficulty of Paying Living Expenses: Not hard at all  Food Insecurity: No Food Insecurity (03/29/2023)    Hunger Vital Sign    Worried About Running Out of Food in the Last Year: Never true    Ran Out of Food in the Last Year: Never true  Transportation Needs: No Transportation Needs (03/29/2023)   PRAPARE - Administrator, Civil Service (Medical): No    Lack of Transportation (Non-Medical): No  Physical Activity: Sufficiently Active (03/29/2023)   Exercise Vital Sign    Days of Exercise per Week: 7 days    Minutes of Exercise per Session: 150+ min  Stress: Stress Concern Present (03/29/2023)   [employer] of Occupational Health - Occupational Stress Questionnaire    Feeling of Stress : To some extent  Social Connections: Moderately Isolated (03/29/2023)   Social Connection and Isolation Panel [NHANES]    Frequency of Communication with Friends and Family: More than three times a week    Frequency of Social Gatherings with Friends and Family: More than three times a week    Attends Religious Services: Never    Database administrator or Organizations: Yes    Attends Engineer, structural: More than 4 times per year    Marital Status: Divorced  Tobacco Counseling Counseling given: Yes Tobacco comments: quit 20 + yrs ago   Clinical Intake:  Pre-visit preparation completed: Yes  Pain : No/denies pain Pain Score: 0-No pain     BMI - recorded: 21.41 Nutritional Status: BMI of 19-24  Normal Nutritional Risks: None Diabetes: No  How often do you need to have someone help you when you read instructions, pamphlets, or other written materials from your doctor or pharmacy?: 1 - Never  Interpreter Needed?: No  Information entered by ::  Ouida Abeyta, CMA   Activities of Daily Living    03/29/2023    2:42 AM 09/03/2022    1:24 PM  In your present state of health, do you have any difficulty performing the following activities:  Hearing? 0   Vision? 0   Difficulty concentrating or making decisions? 0   Walking or climbing stairs? 0   Dressing or bathing? 0    Doing errands, shopping? 0 0  Preparing Food and eating ? N   Using the Toilet? N   In the past six months, have you accidently leaked urine? N   Do you have problems with loss of bowel control? N   Managing your Medications? N   Managing your Finances? N   Housekeeping or managing your Housekeeping? N     Patient Care Team: Kerri Perches, MD as PCP - General (Family Medicine) Newman Pies, MD as Consulting Physician (Otolaryngology) Deberah Castle, MD (Physical Medicine and Rehabilitation) Mack Hook, MD as Consulting Physician (Orthopedic Surgery) Myrlene Broker, MD as Consulting Physician [facility]) Lanelle Bal, DO as Consulting Physician (Internal Medicine)  Indicate any recent Medical Services you may have received from other than [facility] providers in the past year (date may be approximate).     Assessment:   This is a routine wellness examination for Whitman.  Hearing/Vision screen Hearing Screening - Comments:: Patient denies any hearing difficulties.   Vision Screening - Comments:: Wears rx glasses - up to date with routine eye exams with Dr. Charise Killian   Dietary issues and exercise activities discussed:     Goals Addressed             This Visit's Progress    Patient Stated       Patient states he wants to improve his health       Depression Screen    04/01/2023    4:18 PM 10/28/2022    9:56 AM 06/24/2022   10:03 AM 06/08/2022    9:29 AM 03/09/2022    9:12 AM 03/09/2022    9:10 AM 02/19/2022    9:12 AM  PHQ 2/9 Scores  PHQ - 2 Score 0 3 5 1 2 2 3   PHQ- 9 Score  15 13  7 7 8     Fall Risk    03/29/2023    2:42 AM 10/28/2022    9:56 AM 06/24/2022   10:03 AM 06/08/2022    9:29 AM 03/09/2022    9:12 AM  Fall Risk   Falls in the past year? 0 0 0 0 0  Number falls in past yr: 0 0 0 0 0  Injury with Fall? 0 0 0 0 0  Risk for fall due to : No Fall Risks No Fall Risks No Fall Risks No Fall Risks No Fall Risks  Follow up Falls  prevention discussed Falls evaluation completed Falls evaluation completed Falls evaluation completed Falls evaluation completed    MEDICARE RISK AT HOME: Medicare Risk at Home  Any stairs in or around the home?: Yes If so, are there any without handrails?: No Home free of loose throw rugs in walkways, pet beds, electrical cords, etc?: Yes Adequate lighting in your home to reduce risk of falls?: Yes Life alert?: Yes Use of a cane, walker or w/c?: No Grab bars in the bathroom?: No Shower chair or bench in shower?: No Elevated toilet seat or a handicapped toilet?: No  TIMED UP AND GO:  Was the test performed?  No    Cognitive Function:        04/01/2023    4:17 PM 03/09/2022    9:13 AM 03/07/2021   10:47 AM 03/04/2020    3:50 PM 02/08/2019    1:50 PM  6CIT Screen  What Year? 0 points 0 points 0 points 0 points 0 points  What month? 0 points 0 points 0 points 0 points 0 points  What time? 0 points 0 points 0 points 0 points 0 points  Count back from 20 0 points 0 points 0 points 0 points 0 points  Months in reverse 0 points 0 points 0 points 0 points 0 points  Repeat phrase 0 points 0 points 0 points 2 points 0 points  Total Score 0 points 0 points 0 points 2 points 0 points    Immunizations Immunization History  Administered Date(s) Administered   Fluad Quad(high Dose 65+) 04/11/2019, 06/04/2020, 04/22/2021, 06/24/2022   Influenza Split 06/20/2012   Influenza,inj,Quad PF,6+ Mos 04/24/2013, 08/28/2014, 06/13/2015, 08/11/2017   Influenza-Unspecified 04/24/2013, 08/28/2014, 06/13/2015, 08/11/2017   Moderna SARS-COV2 Booster Vaccination 06/27/2020   Moderna Sars-Covid-2 Vaccination 12/05/2019, 01/02/2020, 06/27/2020   PNEUMOCOCCAL CONJUGATE-20 04/22/2021   Pneumococcal Conjugate-13 02/28/2019   Tdap 12/24/2011   Zoster, Live 10/04/2013    TDAP status: Due, Education has been provided regarding the importance of this vaccine. Advised may receive this vaccine at local pharmacy  or Health Dept. Aware to provide a copy of the vaccination record if obtained from local pharmacy or Health Dept. Verbalized acceptance and understanding.  Flu Vaccine status: Declined, Education has been provided regarding the importance of this vaccine but patient still declined. Advised may receive this vaccine at local pharmacy or Health Dept. Aware to provide a copy of the vaccination record if obtained from local pharmacy or Health Dept. Verbalized acceptance and understanding.  Pneumococcal vaccine status: Up to date  Covid-19 vaccine status: Information provided on how to obtain vaccines.   Qualifies for Shingles Vaccine? Yes   Zostavax completed No   Shingrix Completed?: No.    Education has been provided regarding the importance of this vaccine. Patient has been advised to call insurance company to determine out of pocket expense if they have not yet received this vaccine. Advised may also receive vaccine at local pharmacy or Health Dept. Verbalized acceptance and understanding.  Screening Tests Health Maintenance  Topic Date Due   Zoster Vaccines- Shingrix (1 of 2) 09/28/1972   COVID-19 Vaccine (3 - Moderna risk series) 07/25/2020   DTaP/Tdap/Td (2 - Td or Tdap) 12/23/2021   Medicare Annual Wellness (AWV)  03/10/2023   INFLUENZA VACCINE  08/09/2023 (Originally 03/11/2023)   Colonoscopy  09/08/2027   Pneumonia Vaccine 60+ Years old  Completed   Hepatitis C Screening  Completed   HPV VACCINES  Aged Out    Health Maintenance  Health Maintenance Due  Topic Date Due   Zoster Vaccines- Shingrix (1 of 2) 09/28/1972   COVID-19 Vaccine (3 - Moderna risk series) 07/25/2020   DTaP/Tdap/Td (2 -  Td or Tdap) 12/23/2021   Medicare Annual Wellness (AWV)  03/10/2023    Colorectal cancer screening: Type of screening: Colonoscopy. Completed 09/07/2022. Repeat every 5 years  Lung Cancer Screening: (Low Dose CT Chest recommended if Age 65-80 years, 20 pack-year currently smoking OR have quit  w/in 15years.) does not qualify.   Lung Cancer Screening Referral: na  Additional Screening:  Hepatitis C Screening: does not qualify; Completed 09/03/2022  Vision Screening: Recommended annual ophthalmology exams for early detection of glaucoma and other disorders of the eye. Is the patient up to date with their annual eye exam?  Yes  Who is the provider or what is the name of the office in which the patient attends annual eye exams? Dr. Charise Killian [facility] If pt is not established with a provider, would they like to be referred to a provider to establish care? No .   Dental Screening: Recommended annual dental exams for proper oral hygiene  Diabetic Foot Exam: na  Community Resource Referral / Chronic Care Management: CRR required this visit?  No   CCM required this visit?  No     Plan:     I have personally reviewed and noted the following in the patient's chart:   Medical and social history Use of alcohol, tobacco or illicit drugs  Current medications and supplements including opioid prescriptions. Patient is not currently taking opioid prescriptions. Functional ability and status Nutritional status Physical activity Advanced directives List of other physicians Hospitalizations, surgeries, and ER visits in previous 12 months Vitals Screenings to include cognitive, depression, and falls Referrals and appointments  In addition, I have reviewed and discussed with patient certain preventive protocols, quality metrics, and best practice recommendations. A written personalized care plan for preventive services as well as general preventive health recommendations were provided to patient.     Jordan Hawks Yuki Brunsman, CMA   04/01/2023   After Visit Summary: (MyChart) Due to this being a telephonic visit, the after visit summary with patients personalized plan was offered to patient via MyChart   Nurse Notes: patient complained of recent dizziness, however, he refused to schedule  an appointment to be seen when offered.

## 2023-04-02 LAB — MISC LABCORP TEST (SEND OUT): Labcorp test code: 3343

## 2023-04-07 DIAGNOSIS — K219 Gastro-esophageal reflux disease without esophagitis: Secondary | ICD-10-CM | POA: Diagnosis not present

## 2023-04-07 DIAGNOSIS — K224 Dyskinesia of esophagus: Secondary | ICD-10-CM | POA: Diagnosis not present

## 2023-04-09 DIAGNOSIS — K224 Dyskinesia of esophagus: Secondary | ICD-10-CM | POA: Diagnosis not present

## 2023-04-09 DIAGNOSIS — K219 Gastro-esophageal reflux disease without esophagitis: Secondary | ICD-10-CM | POA: Diagnosis not present

## 2023-04-15 ENCOUNTER — Ambulatory Visit (INDEPENDENT_AMBULATORY_CARE_PROVIDER_SITE_OTHER): Payer: Medicare HMO | Admitting: Family Medicine

## 2023-04-15 ENCOUNTER — Encounter: Payer: Self-pay | Admitting: Family Medicine

## 2023-04-15 VITALS — BP 132/73 | HR 58 | Ht 69.0 in | Wt 142.0 lb

## 2023-04-15 DIAGNOSIS — Z23 Encounter for immunization: Secondary | ICD-10-CM | POA: Diagnosis not present

## 2023-04-15 DIAGNOSIS — E559 Vitamin D deficiency, unspecified: Secondary | ICD-10-CM | POA: Diagnosis not present

## 2023-04-15 DIAGNOSIS — Z125 Encounter for screening for malignant neoplasm of prostate: Secondary | ICD-10-CM

## 2023-04-15 DIAGNOSIS — F332 Major depressive disorder, recurrent severe without psychotic features: Secondary | ICD-10-CM

## 2023-04-15 DIAGNOSIS — I10 Essential (primary) hypertension: Secondary | ICD-10-CM

## 2023-04-15 DIAGNOSIS — R42 Dizziness and giddiness: Secondary | ICD-10-CM | POA: Diagnosis not present

## 2023-04-15 DIAGNOSIS — R140 Abdominal distension (gaseous): Secondary | ICD-10-CM | POA: Diagnosis not present

## 2023-04-15 DIAGNOSIS — E785 Hyperlipidemia, unspecified: Secondary | ICD-10-CM | POA: Diagnosis not present

## 2023-04-15 MED ORDER — ESCITALOPRAM OXALATE 10 MG PO TABS: 10.0000 mg | ORAL_TABLET | Freq: Every day | ORAL | 2 refills | Status: DC

## 2023-04-15 NOTE — Patient Instructions (Addendum)
aNnual exam as before, call if you need me sooner  Fasting lipid, cmp and eGFr, pSA, TSH and vit D 11/15 or shortly after please  Flu vaccine today  New medication for depression lexapro one daily  You are being referred for [name] of circulation to brain and for [name] of abdomen, we will call with appt info  You are being referred to local gI re fullness in stomach   Thanks for choosing [facility], we consider it a privelige to serve you.

## 2023-04-16 ENCOUNTER — Other Ambulatory Visit: Payer: Self-pay | Admitting: Gastroenterology

## 2023-04-16 DIAGNOSIS — Z79899 Other long term (current) drug therapy: Secondary | ICD-10-CM | POA: Diagnosis not present

## 2023-04-16 DIAGNOSIS — M3500 Sicca syndrome, unspecified: Secondary | ICD-10-CM | POA: Diagnosis not present

## 2023-04-16 LAB — CMP14+EGFR
ALT: 16 IU/L (ref 0–44)
AST: 21 IU/L (ref 0–40)
Albumin: 4.2 g/dL (ref 3.9–4.9)
Alkaline Phosphatase: 96 IU/L (ref 44–121)
BUN/Creatinine Ratio: 18 (ref 10–24)
BUN: 15 mg/dL (ref 8–27)
Bilirubin Total: 0.2 mg/dL (ref 0.0–1.2)
CO2: 24 mmol/L (ref 20–29)
Calcium: 9.7 mg/dL (ref 8.6–10.2)
Chloride: 107 mmol/L — ABNORMAL HIGH (ref 96–106)
Creatinine, Ser: 0.82 mg/dL (ref 0.76–1.27)
Globulin, Total: 2.5 g/dL (ref 1.5–4.5)
Glucose: 107 mg/dL — ABNORMAL HIGH (ref 70–99)
Potassium: 4.1 mmol/L (ref 3.5–5.2)
Sodium: 142 mmol/L (ref 134–144)
Total Protein: 6.7 g/dL (ref 6.0–8.5)
eGFR: 95 mL/min/{1.73_m2} (ref >59)

## 2023-04-16 LAB — LIPID PANEL
Chol/HDL Ratio: 2.8 ratio (ref 0.0–5.0)
Cholesterol, Total: 194 mg/dL (ref 100–199)
HDL: 70 mg/dL (ref >39)
LDL Chol Calc (NIH): 113 mg/dL — ABNORMAL HIGH (ref 0–99)
Triglycerides: 60 mg/dL (ref 0–149)
VLDL Cholesterol Cal: 11 mg/dL (ref 5–40)

## 2023-04-16 LAB — VITAMIN D 25 HYDROXY (VIT D DEFICIENCY, FRACTURES): Vit D, 25-Hydroxy: 32.3 ng/mL (ref 30.0–100.0)

## 2023-04-16 LAB — PSA: Prostate Specific Ag, Serum: 0.7 ng/mL (ref 0.0–4.0)

## 2023-04-16 LAB — TSH: TSH: 2.57 u[IU]/mL (ref 0.450–4.500)

## 2023-04-19 ENCOUNTER — Encounter: Payer: Self-pay | Admitting: Physician Assistant

## 2023-04-20 ENCOUNTER — Encounter: Payer: Self-pay | Admitting: Family Medicine

## 2023-04-20 DIAGNOSIS — R42 Dizziness and giddiness: Secondary | ICD-10-CM | POA: Insufficient documentation

## 2023-04-20 DIAGNOSIS — R140 Abdominal distension (gaseous): Secondary | ICD-10-CM | POA: Insufficient documentation

## 2023-04-20 NOTE — Progress Notes (Signed)
   Marc [name]     MRN: [id number]      DOB: [birth year]  Chief Complaint  Patient presents with   Hypertension    F/u   Dizziness    Dizziness on random, when going from laying or standing position to standing gets light headed and leans to left. X1 mo   GI Problem    Pt. Complains of stomach pain. Says it feels like there is gas that he can not pass and it is causing pain in upper, center abdominal region.    Joint Swelling    Joint swelling, having pain in tips of fingers and wrists.    Flu Vaccine    Flu vaccination given 04/15/2023    HPI Marc [name] is here for follow up and re-evaluation of chronic medical conditions, medication management and review of any available recent lab and radiology data.  Preventive health is updated, specifically  Cancer screening and Immunization.   Questions or concerns regarding consultations or procedures which the PT has had in the interim are  addressed. Concerns as above   ROS Denies recent fever or chills. Denies sinus pressure, nasal congestion, ear pain or sore throat. Denies chest congestion, productive cough or wheezing.   .   Denies dysuria, frequency, hesitancy or incontinence. Chronic joint pain, Denies headaches, seizures, numbness, or tingling. Denies uncontrolled  depression, anxiety or insomnia.Has stopped meds and no longer seeing Psych Denies skin break down or rash.   PE  BP 132/73 (BP Location: Left Arm, Patient Position: Sitting, Cuff Size: Normal)   Pulse (!) 58   Ht 5\' 9"  (1.753 m)   Wt 142 lb (64.4 kg)   SpO2 97%   BMI 20.97 kg/m   Patient alert and oriented and in no cardiopulmonary distress.  HEENT: No facial asymmetry, EOMI,     Neck supple .  Chest: Clear to auscultation bilaterally.  CVS: S1, S2 no murmurs, no S3.Regular rate.  .   Ext: No edema  MS: Adequate ROM spine, shoulders, hips and knees.  Skin: Intact, no ulcerations or rash noted.  Psych: Good eye contact, normal affect. Memory  intact not anxious or depressed appearing.  CNS: CN 2-12 intact, power,  normal throughout.no focal deficits noted.   Assessment & Plan  Light headedness 1 month history, esp with change in position, carotid US to be obtained, advised to change position slowly  Bloating symptom Increased fullness and bloating with excess gas [name] RUQ and refer GI  Essential hypertension Controlled, no change in medication   MDD (major depressive disorder) Start lexapro 10 mg daily, offered Psych referral , declines at this time

## 2023-04-20 NOTE — Assessment & Plan Note (Signed)
Start lexapro 10 mg daily, offered Psych referral , declines at this time

## 2023-04-20 NOTE — Assessment & Plan Note (Signed)
1 month history, esp with change in position, carotid US to be obtained, advised to change position slowly

## 2023-04-20 NOTE — Assessment & Plan Note (Signed)
Increased fullness and bloating with excess gas [name] RUQ and refer GI

## 2023-04-20 NOTE — Assessment & Plan Note (Signed)
Controlled, no change in medication  

## 2023-04-26 DIAGNOSIS — K118 Other diseases of salivary glands: Secondary | ICD-10-CM | POA: Diagnosis not present

## 2023-04-26 DIAGNOSIS — R1313 Dysphagia, pharyngeal phase: Secondary | ICD-10-CM | POA: Diagnosis not present

## 2023-04-26 DIAGNOSIS — R1314 Dysphagia, pharyngoesophageal phase: Secondary | ICD-10-CM | POA: Diagnosis not present

## 2023-04-26 DIAGNOSIS — H6123 Impacted cerumen, bilateral: Secondary | ICD-10-CM | POA: Diagnosis not present

## 2023-04-28 ENCOUNTER — Ambulatory Visit (HOSPITAL_COMMUNITY)
Admission: RE | Admit: 2023-04-28 | Discharge: 2023-04-28 | Disposition: A | Discharge status: Home / Self Care | Payer: Medicare HMO | Source: Ambulatory Visit | Attending: Family Medicine | Admitting: Family Medicine

## 2023-04-28 DIAGNOSIS — R42 Dizziness and giddiness: Secondary | ICD-10-CM | POA: Diagnosis not present

## 2023-04-28 DIAGNOSIS — K828 Other specified diseases of gallbladder: Secondary | ICD-10-CM | POA: Diagnosis not present

## 2023-04-28 DIAGNOSIS — R1010 Upper abdominal pain, unspecified: Secondary | ICD-10-CM | POA: Diagnosis not present

## 2023-04-28 DIAGNOSIS — K838 Other specified diseases of biliary tract: Secondary | ICD-10-CM | POA: Diagnosis not present

## 2023-04-28 DIAGNOSIS — R140 Abdominal distension (gaseous): Secondary | ICD-10-CM | POA: Insufficient documentation

## 2023-04-28 DIAGNOSIS — I6523 Occlusion and stenosis of bilateral carotid arteries: Secondary | ICD-10-CM | POA: Diagnosis not present

## 2023-05-06 ENCOUNTER — Other Ambulatory Visit: Payer: Self-pay | Admitting: Family Medicine

## 2023-05-14 ENCOUNTER — Other Ambulatory Visit: Payer: Self-pay | Admitting: Internal Medicine

## 2023-05-14 DIAGNOSIS — K219 Gastro-esophageal reflux disease without esophagitis: Secondary | ICD-10-CM

## 2023-05-18 ENCOUNTER — Encounter: Payer: Self-pay | Admitting: *Deleted

## 2023-05-18 ENCOUNTER — Telehealth: Payer: Self-pay | Admitting: *Deleted

## 2023-05-18 ENCOUNTER — Encounter: Payer: Self-pay | Admitting: Gastroenterology

## 2023-05-18 ENCOUNTER — Ambulatory Visit: Payer: Medicare HMO | Admitting: Gastroenterology

## 2023-05-18 VITALS — BP 142/73 | HR 56 | Temp 97.5°F | Ht 69.0 in | Wt 142.4 lb

## 2023-05-18 DIAGNOSIS — K219 Gastro-esophageal reflux disease without esophagitis: Secondary | ICD-10-CM

## 2023-05-18 DIAGNOSIS — K5900 Constipation, unspecified: Secondary | ICD-10-CM | POA: Diagnosis not present

## 2023-05-18 DIAGNOSIS — K838 Other specified diseases of biliary tract: Secondary | ICD-10-CM | POA: Diagnosis not present

## 2023-05-18 DIAGNOSIS — R1010 Upper abdominal pain, unspecified: Secondary | ICD-10-CM | POA: Diagnosis not present

## 2023-05-18 DIAGNOSIS — D509 Iron deficiency anemia, unspecified: Secondary | ICD-10-CM | POA: Diagnosis not present

## 2023-05-18 MED ORDER — SUCRALFATE 1 G PO TABS: 1.0000 g | ORAL_TABLET | Freq: Four times a day (QID) | ORAL | 5 refills | Status: DC

## 2023-05-18 MED ORDER — FAMOTIDINE 20 MG PO TABS: 20.0000 mg | ORAL_TABLET | Freq: Two times a day (BID) | ORAL | 3 refills | Status: AC

## 2023-05-18 MED ORDER — LUBIPROSTONE 24 MCG PO CAPS
ORAL_CAPSULE | ORAL | 3 refills | Status: DC
Start: 2023-05-18 — End: 2024-05-16

## 2023-05-18 MED ORDER — BACLOFEN 10 MG PO TABS
ORAL_TABLET | ORAL | 11 refills | Status: DC
Start: 2023-05-18 — End: 2023-07-18

## 2023-05-18 NOTE — Progress Notes (Unsigned)
GI Office Note    Referring Provider: Kerri Perches, MD Primary Care Physician:  Kerri Perches, MD  Primary Gastroenterologist: Hennie Duos. Marletta Lor, DO   Chief Complaint   Chief Complaint  Patient presents with   Follow-up    Here for further refills     History of Present Illness   Marc [name] is a 67 y.o. male presenting today for follow up for refills. Also received referral from PCP for excessive bloating and upper abdominal discomfort/fullness. Last seen 05/2022. H/o IDA, Heme + stool, elevated LFTs, GERD, dysphagia followed at [facility], constipation, colon polyps, hemorrhoids, and hep C genotype 1a, elastography F3/F4 s/p treatment in the past with Harvoni and documented SVR. Post treatment elastography with kPa 6.2 in February 2021.Elastography repeated in 03/2022 with median kPa of 5.4. H/O Sjogren's, Lichen planus.  Followed by multiple specialists at [facility], including GI, last seen 01/2023. Pending appt with esophageal motility specialist.    Recent RUQ U/S ordered by PCP. RUQ U/S 04/2023: IMPRESSION: 1. No acute abnormality. 2. Mild increased echotexture of the liver, nonspecific but can be seen in hepatic steatosis. 3. Common bile duct is dilated measuring 8.7 mm. Recommend correlation with liver function tests. If clinically indicated, recommend MRCP for further evaluation. 4. Mild gallbladder wall thickening, nonspecific.  Today:  Vacuum pressure in upper abdomen into neck. Intense pressure. Spits up stuff, acid. Can last 45 minutes. Happens when he first gets up and while exercising and walking. Can happen with or without meals. Feels like something coming up at all times. Constantly swallows air trying to keep stuff from coming up. Stomach bloating in upper abdomen. BM about five times per week. No melena, brbpr. Has burning in the chest and hard to swallow. Burning in chest most days.   Prior data: CT A/P with contrast 05/2022: IMPRESSION: No  acute findings within the abdomen or pelvis. Mildly enlarged prostate. Aortic Atherosclerosis (ICD10-I70.0).  For elevated LFTs, he underwent further labs including HCVRNA which was negative, hepatitis B surface antibody which was reactive, hepatitis B surface antigen negative, hepatitis B core antibody total positive, HBV DNA negative, hepatitis A total antibody positive, ASMA negative, mitochondrial antibodies negative.  Has a history of positive ANA followed by rheumatology.  Immunoglobulins normal.   Chronic dysphagia despite dilation and underwent extensive evaluation with esophagram (locally and [facility]), modified barium (locally and at [facility]), esophageal manometry/pH testing Ginette Otto), ENT work-up including laryngoscopy that was largely unrevealing.  On pH testing, he did have increased gastroesophageal weekly acid reflux with recommendations to consider TIF or fundoplication to decrease nonacid reflux.  He has tried and failed multiple PPIs including omeprazole, pantoprazole, Nexium, Aciphex, Prevacid, Dexilant. He had normal peristalsis. Multiple EGDs.   He has been on PPI twice daily, H2 blocker, baclofen with only slight improvement of dysphagia and GERD. He has quite a bit of underlying anxiety and depression which is felt to be playing a role. He has been seen in GI at [facility] with recommendations for high-resolution impedance manometry. Queried whether patient may have component of esophageal lichen planus as he has a history of lichen planus. EGD December 2022 with normal exam status post dilation, esophageal biopsies obtained and were benign. Gastric emptying study January 2023 normal.    EGD 08/2022: -gastritis s/p bx, unremarkable  Colonoscopy 08/2022: -non-bleeding internal hemorrhoids -melanosis coli -three 3-99mm polyps in rectum and trv colon removed -one tubular adenoma, next colonoscopy in 5 years  Work up at [facility]  GI:   Esophagram September 2022: 1.  Laryngeal  penetration to the level of the vocal cords without evidence of aspiration. Consider speech therapy consultation for formal swallow evaluation. 2.  Mild esophageal dysmotility with intraesophageal reflux and proximal escape. 3.  Mild gastroesophageal reflux. 4.  Small sliding hiatal hernia.      MBSS 04/2021: Thin liquid: Transient penetration without aspiration.  Puree: Transient penetration without aspiration.  Solid:No penetration or aspiration. Mild residue.  Barium Tablet and capsule with thin liquid: Stagnation of the barium tablet within the oropharynx. No delay in passage of the capsule. No aspiration.    Solid gastric emptying January 2023: CONCLUSION:  Normal 2-hour gastric retention.  Normal 4-hour gastric retention.   EGD with dilation December 2022: Impression  -Normal EGD   Recommendations -Await pathology -Obtain manometry and pH impedance testing   Esophageal manometry 03/2023: Overall impression: . This is an abnormal esophageal motility study with a  normal tensive basal LES tone and an elevated median IRP.  The is evidence  of ineffective esophageal motility. The IRP is elevated in both the  primary (supine) and secondary (upright) position. 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 Of note the UES residual pressure is elevated.   Clinical decisions should never be made based on the manometric diagnosis  of EGJOO alone. The clinically relevant conclusive diagnosis of EGJOO  requires both clinically relevant symptoms and confirmation with at least  one of the following supportive investigations supporting obstruction  (Timed barium esophagram and/or Endoflip )    PH/Impedance 03/2023 Impression: The patient has a borderline or inconclusive esophageal acid  exposure time for a patient off medication measured by pH according to  the  [place] Consensus. There is an abnormal reflux frequency measured by  impedance. Symptom indices as outlined above. The mean nocturnal baseline  impedance and the reflux frequency favor the diagnosis of GERD.   [name] Abd pelvic doppler 02/2023: Conclusion  No evidence of any critical stenosis throughout proximal through mid-superior mesenteric arteries. Study does not exclude embolic or branch level disease. 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   Transnasal Flexible Laryngoscopy with FEES 04/2023:  Transnasal Flexible Laryngoscopy with FEES performed by Anna Genre MS CCC-SLP  -The exam reveals a larynx with: -Mild diffuse erythema -Modest posterior laryngeal edema -The vocal fold mobility is preserved  -There is minimal midfold atrophy  -There are no lesions on the free edge of the vocal fold, or elsewhere in the larynx worrisome for malignancy -The mucosa of the post-cricoid and interarytenoid region is modestly redundant There are no scant secretions pooled in either pyriform sinus, and no aspiration is identified on this examination -The tongue base and epiglottis are structurally normal -The visualized subglottis and proximal trachea are widely patent Impression:  Patient presents with mild pharyngeal dysphagia; findings consistent with MBS completed 2 years ago. Dysphagia is predominately characterized by reduced bolus propulsion with mild-moderate valleculae residue after the swallow. Patient compensates for these deficits well by opting for soft/moist solids and utilizing liquid wash as needed for clearance. Provided patient with a home swallow exercise program (lingual and pharyngeal strengthening exercises) to complete twice daily for the next 6 weeks. If beneficial, decrease to 1x daily for maintenance.   Medications   Current Outpatient Medications  Medication Sig Dispense Refill   AMBULATORY NON FORMULARY MEDICATION Kratom Take 2  tablet by mouth twice daily     AMBULATORY NON FORMULARY MEDICATION Friska caffiene 1-2 tablet twice daily     amLODipine (NORVASC) 5 MG tablet TAKE ONE TABLET BY MOUTH ONCE DAILY. 30 tablet 0   aspirin EC 81 MG tablet Take 81 mg by mouth daily.     baclofen (LIORESAL) 10 MG tablet TAKE (1/2) TABLET (5MG ) BY MOUTH TWICE DAILY BEFORE A MEAL. 30 tablet 5   cevimeline (EVOXAC) 30 MG capsule Take 30 mg by mouth 2 (two) times daily.     copper tablet Take 3 tablets (6 mg total) by mouth daily. 90 tablet 5   escitalopram (LEXAPRO) 10 MG tablet Take 1 tablet (10 mg total) by mouth daily. 30 tablet 2   famotidine (PEPCID) 20 MG tablet TAKE 1 TABLET BY MOUTH TWICE DAILY. TAKE WITH BREAKFAST AND AT BEDTIME. 60 tablet 3   ferrous sulfate 325 (65 FE) MG tablet Take 325 mg by mouth daily with breakfast.     gabapentin (NEURONTIN) 100 MG capsule Take by mouth.     hydroxychloroquine (PLAQUENIL) 200 MG tablet Take 1 tablet by mouth daily.     hyoscyamine (LEVSIN) 0.125 MG tablet Take by mouth every 4 (four) hours as needed.     lubiprostone (AMITIZA) 24 MCG capsule TAKE 1 CAPSULE TWICE DAILY WITH MEALS. 60 capsule 3   pilocarpine (SALAGEN) 5 MG tablet Take 5 mg by mouth 3 (three) times daily.     RABEprazole (ACIPHEX) 20 MG tablet Take 20 mg by mouth daily.     senna (SENOKOT) 8.6 MG tablet Take 2 tablets by mouth 2 (two) times daily.     sucralfate (CARAFATE) 1 g tablet TAKE 1 TABLET BY MOUTH 4 TIMES DAILY 120 tablet 1   Turmeric 400 MG CAPS Take 800 mg by mouth daily.      Vitamin D3 (VITAMIN D) 25 MCG tablet Take 4,000 Units by mouth daily.      No current facility-administered medications for this visit.    Allergies   Allergies as of 05/18/2023 - Review Complete 05/18/2023  Allergen Reaction Noted   Levofloxacin Nausea Only and Other (See Comments)    Ciprofloxacin  06/13/2015   Sulfa antibiotics  12/02/2012     Review of Systems   General: Negative for anorexia, weight loss, fever, chills,  fatigue, weakness. ENT: Negative for hoarseness,  nasal congestion. See hpi CV: Negative for chest pain, angina, palpitations, dyspnea on exertion, peripheral edema.  Respiratory: Negative for dyspnea at rest, dyspnea on exertion, cough, sputum, wheezing.  GI: See history of present illness. GU:  Negative for dysuria, hematuria, urinary incontinence, urinary frequency, nocturnal urination.  Endo: Negative for unusual weight change.     Physical Exam   BP (!) 142/73 (BP Location: Right Arm, Patient Position: Sitting, Cuff Size: Normal)   Pulse (!) 56   Temp (!) 97.5 F (36.4 C) (Oral)   Ht 5\' 9"  (1.753 m)   Wt 142 lb 6.4 oz (64.6 kg)   SpO2 97%   BMI 21.03 kg/m    General: Well-nourished, well-developed in no acute distress.  Eyes: No icterus. Mouth: Oropharyngeal mucosa moist and pink  Lungs: Clear to auscultation bilaterally.  Heart: Regular rate and rhythm, no murmurs rubs or gallops.  Abdomen: Bowel sounds are normal, nontender, nondistended, no hepatosplenomegaly or masses,  no abdominal bruits or hernia , no rebound or guarding.  Rectal: not performed  Extremities: No lower extremity edema. No clubbing or deformities. Neuro: Alert and oriented x  4   Skin: Warm and dry, no jaundice.   Psych: Alert and cooperative, normal mood and affect.  Labs   Lab Results  Component Value Date   NA 142 04/15/2023   CL 107 (H) 04/15/2023   K 4.1 04/15/2023   CO2 24 04/15/2023   BUN 15 04/15/2023   CREATININE 0.82 04/15/2023   EGFR 95 04/15/2023   CALCIUM 9.7 04/15/2023   ALBUMIN 4.2 04/15/2023   GLUCOSE 107 (H) 04/15/2023   Lab Results  Component Value Date   WBC 7.2 02/17/2023   HGB 13.4 02/17/2023   HCT 40.1 02/17/2023   MCV 90.3 02/17/2023   PLT 172 02/17/2023   Lab Results  Component Value Date   IRON 46 02/17/2023   TIBC 334 02/17/2023   FERRITIN 27 02/17/2023   Lab Results  Component Value Date   VITAMINB12 642 08/12/2022   Lab Results  Component Value  Date   FOLATE 17.1 08/12/2022   Lab Results  Component Value Date   ALT 16 04/15/2023   AST 21 04/15/2023   ALKPHOS 96 04/15/2023   BILITOT 0.2 04/15/2023   CRP <5 Sed rate 15 Hemoglobin 13.2 low, MCV 87.2, platelets 188,000, TSH 2.570  Imaging Studies   US Carotid Duplex Bilateral  Result Date: 04/30/2023 CLINICAL DATA:  67 year old male with a history of lightheadedness EXAM: BILATERAL CAROTID DUPLEX ULTRASOUND TECHNIQUE: Wallace Cullens scale imaging, color Doppler and duplex ultrasound were performed of bilateral carotid and vertebral arteries in the neck. COMPARISON:  None Available. FINDINGS: Criteria: Quantification of carotid stenosis is based on velocity parameters that correlate the residual internal carotid diameter with NASCET-based stenosis levels, using the diameter of the distal internal carotid lumen as the denominator for stenosis measurement. The following velocity measurements were obtained: RIGHT ICA:  Systolic 95 cm/sec, Diastolic 28 cm/sec CCA:  85 cm/sec SYSTOLIC ICA/CCA RATIO:  1.1 ECA:  91 cm/sec LEFT ICA:  Systolic 95 cm/sec, Diastolic 15 cm/sec CCA:  93 cm/sec SYSTOLIC ICA/CCA RATIO:  1.0 ECA:  74 cm/sec Right Brachial SBP: Not acquired Left Brachial SBP: Not acquired RIGHT CAROTID ARTERY: No significant calcifications of the right common carotid artery. Intermediate waveform maintained. Heterogeneous and partially calcified plaque at the right carotid bifurcation. No significant lumen shadowing. Low resistance waveform of the right ICA. No significant tortuosity. RIGHT VERTEBRAL ARTERY: Antegrade flow with low resistance waveform. LEFT CAROTID ARTERY: No significant calcifications of the left common carotid artery. Intermediate waveform maintained. Heterogeneous and partially calcified plaque at the left carotid bifurcation without significant lumen shadowing. Low resistance waveform of the left ICA. No significant tortuosity. LEFT VERTEBRAL ARTERY:  Antegrade flow with low  resistance waveform. IMPRESSION: Color duplex indicates minimal heterogeneous and calcified plaque, with no hemodynamically significant stenosis by duplex criteria in the extracranial cerebrovascular circulation. Signed, Yvone Neu. Miachel Roux, RPVI [facility] Electronically Signed   By: Gilmer Mor D.O.   On: 04/30/2023 08:40   US Abdomen Limited RUQ (LIVER/GB)  Result Date: 04/28/2023 CLINICAL DATA:  Bloating and upper abdomen pain. EXAM: ULTRASOUND ABDOMEN LIMITED RIGHT UPPER QUADRANT COMPARISON:  March 23, 2022 FINDINGS: Gallbladder: The gallbladder wall slightly thickened measuring 3.5 mm. No gallstones visualized. No sonographic Murphy sign noted by sonographer. Common bile duct: Diameter: 8.7 mm, dilated. Liver: Mild increased echotexture. No focal liver lesion. Portal vein is patent on color Doppler imaging with normal direction of blood flow towards the liver. Portal vein measures 13.7 mm. Other: None. IMPRESSION: 1. No acute abnormality.  2. Mild increased echotexture of the liver, nonspecific but can be seen in hepatic steatosis. 3. Common bile duct is dilated measuring 8.7 mm. Recommend correlation with liver function tests. If clinically indicated, recommend MRCP for further evaluation. 4. Mild gallbladder wall thickening, nonspecific. Electronically Signed   By: Sherian Rein M.D.   On: 04/28/2023 10:08    Assessment   GERD/dysphagia -continue baclofen, famotidine, aciphex, sucralfate as before -continue to follow with GI and GI sub specialities at [facility] Constipation  -continue Amitiza bid Upper abdominal pain  -MRI Abd/MRCP Dilated CBD -MRI Abd/MRCP IDA -recent normal Hgb, iron, ferritin (low normal) -colonoscopy/EGD earlier this year -continue oral iron -check celiac serologies -continue to monitor     [employer]. Melvyn Neth, MHS, PA-C [facility]

## 2023-05-18 NOTE — Telephone Encounter (Signed)
PA approved via cohere. Auth: [id number]  DOS: 05/19/2023 - 07/18/2023

## 2023-05-18 NOTE — Patient Instructions (Addendum)
I have refilled your baclofen, famotidine, Amitiza, sucralfate. Complete labs at your convenience at [facility]. MRI abdomen to be scheduled. Continue to follow with GI and GI subspecialties at [facility].

## 2023-05-19 LAB — TISSUE TRANSGLUTAMINASE, IGA: Transglutaminase IgA: <2 U/mL (ref 0–3)

## 2023-05-21 ENCOUNTER — Other Ambulatory Visit (HOSPITAL_COMMUNITY): Payer: Self-pay | Admitting: Gastroenterology

## 2023-05-21 ENCOUNTER — Encounter (HOSPITAL_COMMUNITY): Payer: Self-pay

## 2023-05-21 ENCOUNTER — Ambulatory Visit (HOSPITAL_COMMUNITY)
Admission: RE | Admit: 2023-05-21 | Discharge: 2023-05-21 | Disposition: A | Discharge status: Home / Self Care | Payer: Medicare HMO | Source: Ambulatory Visit | Attending: Gastroenterology | Admitting: Gastroenterology

## 2023-05-21 DIAGNOSIS — R1010 Upper abdominal pain, unspecified: Secondary | ICD-10-CM

## 2023-05-25 DIAGNOSIS — K146 Glossodynia: Secondary | ICD-10-CM | POA: Diagnosis not present

## 2023-05-28 ENCOUNTER — Encounter: Payer: Self-pay | Admitting: Family Medicine

## 2023-05-31 NOTE — Telephone Encounter (Signed)
Spoke with patient regarding what MRI had advised. He stated he has not used the neuro stimulator in a very long time and the battery life span has ran out. He was told by [employer] that a rep through them will need to be organized with patient to come into office to check his neuro stimulator out. Reports it hasn't had charge in a while. Dr. Alexia Freestone he states is the one that ordered this long time ago but they told him this needs to be taken care of through PCP. Patient stated he gave all this info to Dr. Anthony Sar office but reports he was told since we ordered the MRI this will need to be taken care of through [name]. Please advise leslie.

## 2023-05-31 NOTE — Telephone Encounter (Signed)
Spoke with Vincenza Hews in MRI. He states patient should have external device to monitor stimulator to put in MRI mode. He will need to take device, card and make sure both stimulator and device are fully charged to MRI appointment.   Called pt, LMOVM to call back

## 2023-06-01 NOTE — Telephone Encounter (Signed)
Called [employer] and they have paged a local rep to see what can be done.

## 2023-06-04 ENCOUNTER — Telehealth: Payer: Self-pay | Admitting: *Deleted

## 2023-06-04 NOTE — Telephone Encounter (Signed)
Pt left vm stating that he has spoken with Juliette Alcide simpson from [employer] about his neuro stimulator. He says he was told that someone would have to come to the office to check it out and jump start it. FYI

## 2023-06-04 NOTE — Telephone Encounter (Signed)
Marc [name], Mindy spoke to [employer] yesterday. See my notation on 10/18 patient message for MRI.   How long will his neurostimulator stay on after "jump starting"? If it does not stay on long enough to "put in MRI mode" at time of MRI, then it still will not help [name] for MRI at [facility].   We obviously don't usually handle these type issues, being a GI practice, so we will need to find out that information.

## 2023-06-04 NOTE — Telephone Encounter (Signed)
noted 

## 2023-06-05 ENCOUNTER — Other Ambulatory Visit: Payer: Self-pay | Admitting: Family Medicine

## 2023-06-09 DIAGNOSIS — G8929 Other chronic pain: Secondary | ICD-10-CM | POA: Diagnosis not present

## 2023-06-09 DIAGNOSIS — Z1330 Encounter for screening examination for mental health and behavioral disorders, unspecified: Secondary | ICD-10-CM | POA: Diagnosis not present

## 2023-06-09 DIAGNOSIS — M545 Low back pain, unspecified: Secondary | ICD-10-CM | POA: Diagnosis not present

## 2023-06-22 ENCOUNTER — Ambulatory Visit: Payer: Medicare HMO | Admitting: Gastroenterology

## 2023-06-22 ENCOUNTER — Encounter: Payer: Self-pay | Admitting: Gastroenterology

## 2023-06-22 VITALS — BP 147/78 | HR 68 | Temp 97.8°F | Ht 69.0 in | Wt 143.2 lb

## 2023-06-22 DIAGNOSIS — K838 Other specified diseases of biliary tract: Secondary | ICD-10-CM

## 2023-06-22 DIAGNOSIS — K219 Gastro-esophageal reflux disease without esophagitis: Secondary | ICD-10-CM | POA: Diagnosis not present

## 2023-06-22 DIAGNOSIS — R1310 Dysphagia, unspecified: Secondary | ICD-10-CM

## 2023-06-22 DIAGNOSIS — D509 Iron deficiency anemia, unspecified: Secondary | ICD-10-CM

## 2023-06-22 DIAGNOSIS — K5900 Constipation, unspecified: Secondary | ICD-10-CM | POA: Diagnosis not present

## 2023-06-22 DIAGNOSIS — R1010 Upper abdominal pain, unspecified: Secondary | ICD-10-CM

## 2023-06-22 NOTE — Progress Notes (Signed)
GI Office Note    Referring Provider: Kerri Perches, MD Primary Care Physician:  Kerri Perches, MD  Primary Gastroenterologist: Hennie Duos. Marletta Lor, DO   Chief Complaint   Chief Complaint  Patient presents with   Dysphagia    States that it hurts to swallow and that it feels like things are getting stuck. Also states something is coming up in his mouth to where he has to spit it out.    History of Present Illness   Marc [name] is a 67 y.o. male presenting today for dysphagia. Last seen May 18, 2023.  History of IDA, Hemoccult positive stools completed colonoscopy and EGD as outlined below.  Also with history of elevated LFTs, GERD, dysphagia followed at [facility], constipation, colon polyps, hemorrhoids, hepatitis C genotype Ia treated with Harvoni with SVR documented, elastography F3/F4 prior to treatment.  Posttreatment elastography with K PA 6.2 in February 2021.  Elastography repeated August 2023 with median K PA of 5.4.  He also has a history of ?Sjogren's, lichen planus.  Followed by multiple specialists at [facility] and [place], including GI, last seen 03/2023 for pH/impedence/manometry.  Pending appointment with esophageal motility specialist, per patient cannot be seen until 11/2023.  See below for extensive history.  Recent RUQ U/S ordered by PCP. RUQ U/S 04/2023: IMPRESSION: 1. No acute abnormality. 2. Mild increased echotexture of the liver, nonspecific but can be seen in hepatic steatosis. 3. Common bile duct is dilated measuring 8.7 mm. Recommend correlation with liver function tests. If clinically indicated, recommend MRCP for further evaluation. 4. Mild gallbladder wall thickening, nonspecific.   MRCP pending because he has a neurostimulator in place with a dead battery, MRI requires battery to be charged and placed in MRI mode for them to proceed, patient has a follow-up appointment with neurostimulator  surgeon.    Today: Since his last office visit he has been seen neurology at [facility], ?burning mouth syndrome. He has recently also been seen by Rheumatology at [facility] currently working him up for possible Sjogren's syndrome.  He saw otolaryngology at [facility] for his dysphagia, and noted to have mild diffuse erythema and modest posterior laryngeal edema on transnasal flexible laryngoscopy with FEES.  Mild pharyngeal deficits and manages adequately by eating soft foods and alternating bites and sips.  Manages xerostomia with cevimeline.    Patient states his symptoms are progressively worse. Gets a lot of pressure that he feels coming from his esophagus, forces stuff up and he feels like he has to spit it out.  States he can feel a whole cup up with it.  Symptoms continue to get worse.  He finds it difficult to cope with this.  Essentially eating only bananas, Greek yogurt, peanut butter toast.  Does not try to eat meat anymore.  States after eating breakfast in the morning he waits about an hour before he goes to exercise.  He notes typically he will find more stuff comes up into his mouth with exercise.  Progressively during the day finds it harder to swallow even water.  Feels like there is a lot of pressure that prevents him from swallowing.  States it affects his ability to form his words and talk.  His voice becomes gravelly.  Typically will not eat again until night because he does not want to deal with the situation.  Fortunately he has not had any weight loss.  Dry mouth lozenges and  chewing gum heps some with stuff coming back up as it enables him to continually swallow. Later in day he finds that he prefers to stop chewing gum and sucking on lozenges and he just spits his stuff out to try to get relief.  Bowel movements regular.  No melena or rectal bleeding.    States he was able to get signed off to have an MRI, has a letter that he needs to get to [name] so that we can get him rescheduled  for his MRCP.   Pending appt with esophageal motility specialist?   Wt Readings from Last 15 Encounters:  06/22/23 143 lb 3.2 oz (65 kg)  05/18/23 142 lb 6.4 oz (64.6 kg)  04/15/23 142 lb (64.4 kg)  04/01/23 145 lb (65.8 kg)  03/26/23 145 lb 3.2 oz (65.9 kg)  10/28/22 139 lb 1.9 oz (63.1 kg)  09/07/22 138 lb 14.2 oz (63 kg)  09/03/22 138 lb 12.8 oz (63 kg)  08/19/22 138 lb 12.8 oz (63 kg)  06/24/22 139 lb 0.6 oz (63.1 kg)  05/27/22 140 lb 9.6 oz (63.8 kg)  04/09/22 137 lb 14.4 oz (62.6 kg)  03/13/22 139 lb (63 kg)  03/12/22 140 lb 3.4 oz (63.6 kg)  03/05/22 138 lb 3.2 oz (62.7 kg)     Prior data: CT A/P with contrast 05/2022: IMPRESSION: No acute findings within the abdomen or pelvis. Mildly enlarged prostate. Aortic Atherosclerosis (ICD10-I70.0).   For elevated LFTs, he underwent further labs including HCVRNA which was negative, hepatitis B surface antibody which was reactive, hepatitis B surface antigen negative, hepatitis B core antibody total positive, HBV DNA negative, hepatitis A total antibody positive, ASMA negative, mitochondrial antibodies negative.  Has a history of positive ANA followed by rheumatology.  Immunoglobulins normal.   Chronic dysphagia despite dilation and underwent extensive evaluation with esophagram (locally and [facility]), modified barium (locally and at [facility]), esophageal manometry/pH testing Ginette Otto), ENT work-up including laryngoscopy that was largely unrevealing.  On pH testing, he did have increased gastroesophageal weekly acid reflux with recommendations to consider TIF or fundoplication to decrease nonacid reflux.  He has tried and failed multiple PPIs including omeprazole, pantoprazole, Nexium, Aciphex, Prevacid, Dexilant. He had normal peristalsis. Multiple EGDs.   He has been on PPI twice daily, H2 blocker, baclofen with only slight improvement of dysphagia and GERD. He has quite a bit of underlying anxiety and depression which is felt to be  playing a role. He has been seen in GI at [facility] with recommendations for high-resolution impedance manometry. Queried whether patient may have component of esophageal lichen planus as he has a history of lichen planus. EGD December 2022 with normal exam status post dilation, esophageal biopsies obtained and were benign. Gastric emptying study January 2023 normal.     EGD 08/2022: -gastritis s/p bx, unremarkable   Colonoscopy 08/2022: -non-bleeding internal hemorrhoids -melanosis coli -three 3-58mm polyps in rectum and trv colon removed -one tubular adenoma, next colonoscopy in 5 years  Work up at [facility]:   Esophagram September 2022: 1.  Laryngeal penetration to the level of the vocal cords without evidence of aspiration. Consider speech therapy consultation for formal swallow evaluation. 2.  Mild esophageal dysmotility with intraesophageal reflux and proximal escape. 3.  Mild gastroesophageal reflux. 4.  Small sliding hiatal hernia.      MBSS 04/2021: Thin liquid: Transient penetration without aspiration.  Puree: Transient penetration without aspiration.  Solid:No penetration or aspiration. Mild residue.  Barium Tablet and capsule with thin  liquid: Stagnation of the barium tablet within the oropharynx. No delay in passage of the capsule. No aspiration.    Solid gastric emptying January 2023: CONCLUSION:  Normal 2-hour gastric retention.  Normal 4-hour gastric retention.   EGD with dilation December 2022: Impression  -Normal EGD   Recommendations -Await pathology -Obtain manometry and pH impedance testing   Esophageal manometry 03/2023: Overall impression: This is an abnormal esophageal motility study with a  normal tensive basal LES tone and an elevated median IRP.  The is evidence  of ineffective esophageal motility. The IRP is elevated in both the  primary (supine) and secondary (upright) position. > 20% of supine  swallows have an elevated IBP.This is consistent  with the manometric   diagnosis of esophagogastric junction outflow obstruction without  disordered peristalsis. Of note the UES residual pressure is elevated.    Clinical decisions should never be made based on the manometric diagnosis  of EGJOO alone. The clinically relevant conclusive diagnosis of EGJOO  requires both clinically relevant symptoms and confirmation with at least  one of the following supportive investigations supporting obstruction  (Timed barium esophagram and/or Endoflip )    PH/Impedance 03/2023 Impression: The patient has a borderline or inconclusive esophageal acid  exposure time for a patient off medication measured by pH according to the  [place] Consensus. There is an abnormal reflux frequency measured by  impedance. Symptom indices as outlined above. The mean nocturnal baseline  impedance and the reflux frequency favor the diagnosis of GERD.    [name] Abd pelvic doppler 02/2023: Conclusion  No evidence of any critical stenosis throughout proximal through mid-superior mesenteric arteries. Study does not exclude embolic or branch level disease. 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    Transnasal Flexible Laryngoscopy with FEES 04/2023:  Transnasal Flexible Laryngoscopy with FEES performed by Anna Genre MS CCC-SLP  -The exam reveals a larynx with: -Mild diffuse erythema -Modest posterior laryngeal edema -The vocal fold mobility is preserved  -There is minimal midfold atrophy  -There are no lesions on the free edge of the vocal fold, or elsewhere in the larynx worrisome for malignancy -The mucosa of the post-cricoid and interarytenoid region is modestly redundant There are no scant secretions pooled in either pyriform sinus, and no aspiration is identified on this examination -The tongue base and epiglottis are structurally normal -The visualized subglottis and proximal trachea are widely patent Impression:  Patient presents  with mild pharyngeal dysphagia; findings consistent with MBS completed 2 years ago. Dysphagia is predominately characterized by reduced bolus propulsion with mild-moderate valleculae residue after the swallow. Patient compensates for these deficits well by opting for soft/moist solids and utilizing liquid wash as needed for clearance. Provided patient with a home swallow exercise program (lingual and pharyngeal strengthening exercises) to complete twice daily for the next 6 weeks. If beneficial, decrease to 1x daily for maintenance.    Medications   Current Outpatient Medications  Medication Sig Dispense Refill   AMBULATORY NON FORMULARY MEDICATION Kratom Take 2 tablet by mouth twice daily     AMBULATORY NON FORMULARY MEDICATION Friska caffiene 1-2 tablet twice daily     amLODipine (NORVASC) 5 MG tablet TAKE ONE TABLET BY MOUTH ONCE DAILY. 30 tablet 0   aspirin EC 81 MG tablet Take 81 mg by mouth daily.     baclofen (LIORESAL) 10 MG tablet TAKE (1/2) TABLET (5MG ) BY MOUTH TWICE DAILY BEFORE A MEAL. 30 tablet 11   cevimeline (EVOXAC) 30 MG capsule Take  30 mg by mouth 2 (two) times daily.     copper tablet Take 3 tablets (6 mg total) by mouth daily. 90 tablet 5   escitalopram (LEXAPRO) 10 MG tablet Take 1 tablet (10 mg total) by mouth daily. 30 tablet 2   famotidine (PEPCID) 20 MG tablet Take 1 tablet (20 mg total) by mouth 2 (two) times daily. 180 tablet 3   ferrous sulfate 325 (65 FE) MG tablet Take 325 mg by mouth daily with breakfast.     gabapentin (NEURONTIN) 100 MG capsule Take by mouth.     hydroxychloroquine (PLAQUENIL) 200 MG tablet Take 1 tablet by mouth daily.     hyoscyamine (LEVSIN) 0.125 MG tablet Take by mouth every 4 (four) hours as needed.     lubiprostone (AMITIZA) 24 MCG capsule TAKE 1 CAPSULE TWICE DAILY WITH MEALS. 180 capsule 3   pilocarpine (SALAGEN) 5 MG tablet Take 5 mg by mouth 3 (three) times daily.     RABEprazole (ACIPHEX) 20 MG tablet Take 20 mg by mouth daily.      senna (SENOKOT) 8.6 MG tablet Take 2 tablets by mouth 2 (two) times daily.     sucralfate (CARAFATE) 1 g tablet Take 1 tablet (1 g total) by mouth 4 (four) times daily. 120 tablet 5   Turmeric 400 MG CAPS Take 800 mg by mouth daily.      Vitamin D3 (VITAMIN D) 25 MCG tablet Take 4,000 Units by mouth daily.      No current facility-administered medications for this visit.    Allergies   Allergies as of 06/22/2023 - Review Complete 06/22/2023  Allergen Reaction Noted   Levofloxacin Nausea Only and Other (See Comments)    Ciprofloxacin  06/13/2015   Sulfa antibiotics  12/02/2012        Review of Systems   General: Negative for anorexia, weight loss, fever, chills, fatigue, weakness. ENT: Negative for nasal congestion. See hpi CV: Negative for chest pain, angina, palpitations, dyspnea on exertion, peripheral edema.  Respiratory: Negative for dyspnea at rest, dyspnea on exertion, cough, sputum, wheezing.  GI: See history of present illness. GU:  Negative for dysuria, hematuria, urinary incontinence, urinary frequency, nocturnal urination.  Endo: Negative for unusual weight change.     Physical Exam   BP (!) 147/78 (BP Location: Right Arm, Patient Position: Sitting, Cuff Size: Normal)   Pulse 68   Temp 97.8 F (36.6 C) (Oral)   Ht 5\' 9"  (1.753 m)   Wt 143 lb 3.2 oz (65 kg)   SpO2 97%   BMI 21.15 kg/m    General: Well-nourished, well-developed in no acute distress.  Eyes: No icterus. Mouth: Oropharyngeal mucosa moist and pink  Lungs: Clear to auscultation bilaterally.  Heart: Regular rate and rhythm, no murmurs rubs or gallops.  Abdomen: Bowel sounds are normal,  nondistended, no hepatosplenomegaly or masses,  no abdominal bruits or hernia , no rebound or guarding. Mild ruq tenderness Rectal: not performed  Extremities: No lower extremity edema. No clubbing or deformities. Neuro: Alert and oriented x 4   Skin: Warm and dry, no jaundice.   Psych: Alert and cooperative,  normal mood and affect.  Labs   Lab Results  Component Value Date   NA 142 04/15/2023   CL 107 (H) 04/15/2023   K 4.1 04/15/2023   CO2 24 04/15/2023   BUN 15 04/15/2023   CREATININE 0.82 04/15/2023   EGFR 95 04/15/2023   CALCIUM 9.7 04/15/2023   ALBUMIN 4.2 04/15/2023  GLUCOSE 107 (H) 04/15/2023   Lab Results  Component Value Date   ALT 16 04/15/2023   AST 21 04/15/2023   ALKPHOS 96 04/15/2023   BILITOT 0.2 04/15/2023   Lab Results  Component Value Date   WBC 7.2 02/17/2023   HGB 13.4 02/17/2023   HCT 40.1 02/17/2023   MCV 90.3 02/17/2023   PLT 172 02/17/2023   April 16, 2023: Hemoglobin 13.2, hematocrit 38.4 slightly low  Imaging Studies   No results found.  Assessment   GERD/dysphagia -complex case, see extensive work up as outlined.  Symptoms likely multifactorial with some oropharyngeal component to his dysphagia as well.  Some concern for EGJ outflow obstruction without disordered peristalsis, of note the UES residual pressure is elevated.  Patient believes he has a follow-up planned for esophageal motility clinic in April -Clearly symptoms are back in his daily life, fortunately however no notable weight loss -Currently on low-dose baclofen 5 mg twice daily along with famotidine, Aciphex, sucralfate -Screen for myasthenia gravis -Discuss case with Dr. Marletta Lor  Dilated common bile duct -Noted on ultrasound imaging, LFTs have been normal -MRCP delayed due to history of neurostimulator, patient reports that he has gotten approval to pursue MRI, he will bring this letter for review.  Issue has been that his battery has been dead, neurostimulator nonfunctional but MRI requiring charged battery and watching patient put neurostimulator and MRI safe mode.  Constipation -Continue Amitiza 24 mcg twice daily  IDA -Recent hemoglobin normal -Colonoscopy/EGD earlier this year -Iliac serologies negative -Also followed by hematology, history of low ceruloplasmin,  low serum copper, normal 24 hour urinary copper     Leanna Battles. Melvyn Neth, MHS, PA-C [facility]

## 2023-06-22 NOTE — Patient Instructions (Signed)
Please complete labs at [facility]. We will be in touch with results.  I will discuss your case further with Dr. Marletta Lor for additional recommendations.

## 2023-06-30 ENCOUNTER — Encounter: Payer: Self-pay | Admitting: Family Medicine

## 2023-06-30 ENCOUNTER — Ambulatory Visit (INDEPENDENT_AMBULATORY_CARE_PROVIDER_SITE_OTHER): Payer: Medicare HMO | Admitting: Family Medicine

## 2023-06-30 VITALS — BP 120/70 | HR 63 | Ht 69.0 in | Wt 141.0 lb

## 2023-06-30 DIAGNOSIS — F32A Depression, unspecified: Secondary | ICD-10-CM

## 2023-06-30 DIAGNOSIS — Z0001 Encounter for general adult medical examination with abnormal findings: Secondary | ICD-10-CM | POA: Diagnosis not present

## 2023-06-30 DIAGNOSIS — R1310 Dysphagia, unspecified: Secondary | ICD-10-CM

## 2023-06-30 DIAGNOSIS — F419 Anxiety disorder, unspecified: Secondary | ICD-10-CM | POA: Diagnosis not present

## 2023-06-30 NOTE — Progress Notes (Signed)
   Marc [name]     MRN: [id number]      DOB: 10-06-1955  Chief Complaint  Patient presents with   Annual Exam    HPI: Patient is in for annual physical exam. Uncontrolled depression and anxiety are addressed , he is no actively suicdal but has severe depression all related to his chronic debilitating illness causing him decreased ability toswallow, has severe dry mouth, maintaining adequate nutrition increasingly difficulty Stillreports worsening dysphagia, hopes to get sooner appt to [facility] than approx 4 months, wull message local GI Ptovider also Patient and/or legal guardian verbally consented to [facility] services about presenting concerns and psychiatric consultation as appropriate.  The services will be billed as appropriate for the patient  .    PE; BP 120/70   Pulse 63   Ht 5\' 9"  (1.753 m)   Wt 141 lb (64 kg)   SpO2 98%   BMI 20.82 kg/m   Pleasant male, alert and oriented x 3, in no cardio-pulmonary distress.Chronically ill appearing Afebrile. HEENT No facial trauma or asymetry. Sinuses non tender. EOMI External ears normal,  Neck: supple, no adenopathy,JVD or thyromegaly.No bruits.  Chest: Clear to ascultation bilaterally.No crackles or wheezes. Non tender to palpation  Cardiovascular system; Heart sounds normal,  S1 and  S2 ,no S3.  No murmur, or thrill. Apical beat not displaced Peripheral pulses normal.  Abdomen: Soft, mild epigastric tenderness, no organomegaly or masses. No bruits. Bowel sounds normal. No guarding,  or rebound.    Musculoskeletal exam: Full ROM of spine, hips , shoulders and knees. No deformity ,swelling or crepitus noted. No muscle wasting or atrophy.   Neurologic: Cranial nerves 2 to 12 intact. Power, tone ,sensation  normal throughout. No disturbance in gait. r.  Skin: Intact, no ulceration, erythema , scaling or rash noted. Pigmentation normal throughout  Psych; Depressed mood and flat   affect. Judgement and concentration normal   Assessment & Plan:  Encounter for Medicare annual examination with abnormal findings Annual exam as documented. . Immunization and cancer screening needs are specifically addressed at this visit.   Anxiety and depression Refer to psych soonest v[place]abel appt  Dysphagia Reports worsens continually, nutrtion is a great  challenge, message to be sent to GI Provider , ocal requesting help with sooner appt a [facility]

## 2023-06-30 NOTE — Assessment & Plan Note (Signed)
Annual exam as documented. . Immunization and cancer screening needs are specifically addressed at this visit.  

## 2023-06-30 NOTE — Patient Instructions (Addendum)
F/U in 4 months, call if you need me sooner  You are referred to psychiatry and therapist  I will reach out to your local GI Provider  I recommend both the shingrix vaccines and TdaP, they are both overdue and are available at your pharmacy  The hope is that your physical symptoms will improve  Thanks for choosing [facility], we consider it a privelige to serve you.

## 2023-07-04 ENCOUNTER — Encounter: Payer: Self-pay | Admitting: Physician Assistant

## 2023-07-05 ENCOUNTER — Inpatient Hospital Stay: Payer: Medicare HMO | Attending: Hematology

## 2023-07-05 DIAGNOSIS — D509 Iron deficiency anemia, unspecified: Secondary | ICD-10-CM | POA: Diagnosis not present

## 2023-07-05 DIAGNOSIS — Z79899 Other long term (current) drug therapy: Secondary | ICD-10-CM | POA: Diagnosis not present

## 2023-07-05 DIAGNOSIS — R778 Other specified abnormalities of plasma proteins: Secondary | ICD-10-CM

## 2023-07-05 DIAGNOSIS — E610 Copper deficiency: Secondary | ICD-10-CM

## 2023-07-05 LAB — COMPREHENSIVE METABOLIC PANEL
ALT: 31 U/L (ref 0–44)
AST: 29 U/L (ref 15–41)
Albumin: 4 g/dL (ref 3.5–5.0)
Alkaline Phosphatase: 76 U/L (ref 38–126)
Anion gap: 8 (ref 5–15)
BUN: 18 mg/dL (ref 8–23)
CO2: 25 mmol/L (ref 22–32)
Calcium: 9.1 mg/dL (ref 8.9–10.3)
Chloride: 106 mmol/L (ref 98–111)
Creatinine, Ser: 0.79 mg/dL (ref 0.61–1.24)
GFR, Estimated: >60 mL/min (ref >60)
Glucose, Bld: 108 mg/dL — ABNORMAL HIGH (ref 70–99)
Potassium: 3.6 mmol/L (ref 3.5–5.1)
Sodium: 139 mmol/L (ref 135–145)
Total Bilirubin: 0.5 mg/dL (ref <1.2)
Total Protein: 7.4 g/dL (ref 6.5–8.1)

## 2023-07-05 LAB — CBC WITH DIFFERENTIAL/PLATELET
Abs Immature Granulocytes: 0.01 10*3/uL (ref 0.00–0.07)
Basophils Absolute: 0 10*3/uL (ref 0.0–0.1)
Basophils Relative: 1 %
Eosinophils Absolute: 0.2 10*3/uL (ref 0.0–0.5)
Eosinophils Relative: 3 %
HCT: 44.3 % (ref 39.0–52.0)
Hemoglobin: 14.3 g/dL (ref 13.0–17.0)
Immature Granulocytes: 0 %
Lymphocytes Relative: 16 %
Lymphs Abs: 1 10*3/uL (ref 0.7–4.0)
MCH: 29.6 pg (ref 26.0–34.0)
MCHC: 32.3 g/dL (ref 30.0–36.0)
MCV: 91.7 fL (ref 80.0–100.0)
Monocytes Absolute: 0.4 10*3/uL (ref 0.1–1.0)
Monocytes Relative: 7 %
Neutro Abs: 4.8 10*3/uL (ref 1.7–7.7)
Neutrophils Relative %: 73 %
Platelets: 184 10*3/uL (ref 150–400)
RBC: 4.83 MIL/uL (ref 4.22–5.81)
RDW: 12.8 % (ref 11.5–15.5)
WBC: 6.5 10*3/uL (ref 4.0–10.5)
nRBC: 0 % (ref 0.0–0.2)

## 2023-07-05 LAB — IRON AND TIBC
Iron: 54 ug/dL (ref 45–182)
Saturation Ratios: 17 % — ABNORMAL LOW (ref 17.9–39.5)
TIBC: 324 ug/dL (ref 250–450)
UIBC: 270 ug/dL

## 2023-07-05 LAB — LACTATE DEHYDROGENASE: LDH: 151 U/L (ref 98–192)

## 2023-07-05 LAB — FERRITIN: Ferritin: 76 ng/mL (ref 24–336)

## 2023-07-06 LAB — KAPPA/LAMBDA LIGHT CHAINS
Kappa free light chain: 30 mg/L — ABNORMAL HIGH (ref 3.3–19.4)
Kappa, lambda light chain ratio: 1.83 — ABNORMAL HIGH (ref 0.26–1.65)
Lambda free light chains: 16.4 mg/L (ref 5.7–26.3)

## 2023-07-06 LAB — CERULOPLASMIN: Ceruloplasmin: 12.9 mg/dL — ABNORMAL LOW (ref 16.0–31.0)

## 2023-07-06 NOTE — Assessment & Plan Note (Signed)
Reports worsens continually, nutrtion is a great  challenge, message to be sent to GI Provider , ocal requesting help with sooner appt a [address]

## 2023-07-06 NOTE — Assessment & Plan Note (Signed)
Refer to psych soonest vailabel appt

## 2023-07-07 ENCOUNTER — Other Ambulatory Visit: Payer: Self-pay | Admitting: Family Medicine

## 2023-07-07 LAB — COPPER, SERUM: Copper: 51 ug/dL — ABNORMAL LOW (ref 69–132)

## 2023-07-09 NOTE — Progress Notes (Unsigned)
[facility] [address], [place] [address]   CLINIC:  Medical Oncology/Hematology  PCP:  Kerri Perches, MD [address] [place] [address] [phone]   REASON FOR VISIT:  Follow-up for abnormal immunofixation + leukopenia/thrombocytopenia + iron deficiency anemia   PRIOR THERAPY: None   CURRENT THERAPY: Oral iron supplementation  INTERVAL HISTORY:   Marc [name] 67 y.o. male returns for routine follow-up of his iron deficiency anemia, leukopenia, and thrombocytopenia.  He was last seen by Rojelio Brenner PA-C on 03/03/2023.  He received IV iron with Venofer 300 mg x 2 in August 2024.   At today's visit, he reports feeling fair. He continues to report the chronic constellation of symptoms (also seeing rheumatology) including chronic pain syndrome, generalized pruritus, dry/burning mouth, chills, night sweats, tinnitus, intermittent hearing loss, positional dizziness, and nondiabetic peripheral neuropathy.  He does note some "pressure between his shoulder blades" when he is working out, and I have advised him to speak with primary care to consider possible cardiology referral; have advised him to avoid any exertion in the meantime and to seek immediate medical attention if symptoms persist.   He has worsening dysphagia and reflux symptoms, being followed by GI at [facility] as well as local GI.  No new bone pain, unexplained fevers, or unintentional weight loss.  No new masses or lymphadenopathy.   He continues to take ibuprofen once or twice daily despite being advised not to, since it is "the only thing that helps with his pain and inflammation."  He started copper supplement in January 2024 - current dose of 6 mg daily (since July 2024).  He stopped taking his zinc-containing multivitamin in July 2024.  He continues to take daily iron tablet.  He denies any rectal bleeding, melena, or epistaxis.  He reports that he was recently started on  hydroxychloroquine by rheumatology for "some sort of rheumatoid arthritis."  He has 80% energy and 65% appetite. He endorses that he is maintaining a stable weight.   ASSESSMENT & PLAN:  1.  Abnormal SPEP/immunofixation - Patient seen at the request of his rheumatologist (Dr. Corliss Skains) for evaluation of abnormal immunofixation. - Labs from rheumatology: Immunofixation from 02/05/2022 showed poorly defined area of restricted protein mobility reactive with lambda light chain antisera.   Quantitative immunoglobulins were on the lower limits of normal values.   SPEP negative for M spike - Hematology work-up (03/12/2022) NEGATIVE for any plasma cell dyscrasia SPEP unremarkable. Immunofixation negative. Mildly elevated kappa free light chain 24.2 with normal lambda 12.1 and mildly elevated ratio 2.0. - Most recent MGUS/myeloma panel (07/05/2023):  Immunofixation PENDING SPEP PENDING Stable mild elevation in FLC ratio 1.83, with mildly elevated kappa 30.0 and normal lambda 16.4 Normal hemoglobin, creatinine, calcium.  LDH normal.  - Constellation of symptoms (also seeing rheumatology) including chronic pain syndrome, chills, night sweats, tinnitus, intermittent hearing loss, positional dizziness, and nondiabetic peripheral neuropathy.   - No new bone pain, unexplained fevers, or unintentional weight loss. - PLAN: Will follow results of most recent MGUS/myeloma panel.  If no evidence of monoclonal protein, I do not plan for further testing at this point.   2.  Thrombocytopenia and leukopenia, RESOLVED - Previously seen at [facility] from 2017-2018 due to leukopenia and thrombocytopenia in the setting of hepatitis C and fatty liver disease, as well as elevated ferritin/iron saturation. - Most recent CBC/D (07/05/2023) shows normal WBC and platelets   3.  Copper deficiency - Nutritional  panel (08/12/2022): Copper deficiency (62), normal B12 642, normal MMA, normal folate. - 24-hour  urine copper (03/30/2023) was within normal limits - Started copper supplement in January 2024 - current dose of 6 mg daily (since July 2024)  - Stopped taking his zinc-containing multivitamin in July 2024 - Most recent labs (07/05/2023): Copper 51, ceruloplasmin 12.9 - PLAN: We will have him STOP taking oral iron supplementation, since this may be worsening copper deficiency by decreasing copper absorption.   - Recommend continuing copper 6 mg daily.  - Recheck copper levels in 4 months.   4.  Iron deficiency anemia - Patient has history of elevated ferritin and iron saturation in the setting of liver disease, but most recent labs consistent with iron deficiency anemia. - Hemoccult stool positive x 3 (September 2023). - EGD (09/07/2022): Gastritis, mild.  No endoscopic evidence of bleeding. - Colonoscopy (09/07/2022): Nonbleeding internal hemorrhoids, melanosis coli, polyps x 3 (tubular adenoma x 1, hyperplastic polyp x 1, polypoid colonic mucosa x 1). - Denies bright red blood per rectum or melena - Takes ibuprofen twice daily - Has been taking iron tablet once daily since August 2023. - Received IV iron with Venofer 300 mg x 2 in August 2024 due to symptomatic iron deficiency, but denies any improvement in symptoms - Admits to chronic fatigue.  Denies pica. - Labs (07/05/2023): Normal Hgb 14.3, but mild iron deficiency with ferritin 76, iron saturation 17% - PLAN: Patient declines additional IV iron at this time. - Patient instructed to STOP oral iron supplementation, since this may be interfering with and worsening his copper deficiency.  - Advised to STOP all NSAID medications - Continue GI follow-up - We will repeat CBC and iron panel with RTC in 4 months   5.  Other history - PMH: Osteoarthritis, chronic pain syndrome, history of hepatitis C and transaminitis (follows with Dr. Marletta Lor at [facility]), GERD, recovering alcoholism in remission (quit drinking 2020), hypertension, prediabetes,  history of colon polyps, anxiety/depression, vitamin D deficiency.  He follows with rheumatology due to positive ANA, sicca symptoms, constitutional symptoms, and arthritic pain (recently referred to [facility] for second opinion). - SOCIAL:  Lives alone and is functionally independent.  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 - SUBSTANCE: Past history of substance abuse, but has been sober for the past 3 years.  He reports prior history of daily heavy alcohol use on and off for about 45 years.  He reports previous heavy drug use including IV heroin, cocaine, MDMA, and opioid/benzodiazepine pills.  He worked very hard via Alcoholics Anonymous over the past 3 years. - FAMILY: No family history of multiple myeloma.  Mother deceased secondary to urachal bladder cancer.  Sister deceased secondary to metastatic stomach cancer.  Maternal grandmother with unspecified cancer.  Maternal grandfather with brain cancer.   PLAN SUMMARY: >> Labs in 4 months = CBC/D, CMP, ferritin, iron/TIBC, copper >> OFFICE visit in 4 months (1 week after labs).     REVIEW OF SYSTEMS:   Review of Systems  Constitutional:  Positive for fatigue. Negative for appetite change, chills, diaphoresis, fever and unexpected weight change.  HENT:   Positive for trouble swallowing (dry mouth and burning tongue). Negative for lump/mass and nosebleeds.   Eyes:  Negative for eye problems.  Respiratory:  Negative for cough, hemoptysis and shortness of breath.   Cardiovascular:  Negative for chest pain, leg swelling and palpitations.  Gastrointestinal:  Negative for abdominal pain, blood in stool, constipation, diarrhea,  nausea and vomiting.  Genitourinary:  Negative for hematuria.   Musculoskeletal:  Positive for arthralgias.  Skin: Negative.   Neurological:  Positive for dizziness and numbness. Negative for headaches and light-headedness.  Hematological:  Does not bruise/bleed easily.   Psychiatric/Behavioral:  Negative for depression and sleep disturbance. The patient is not nervous/anxious.      PHYSICAL EXAM:  ECOG PERFORMANCE STATUS: 1 - Symptomatic but completely ambulatory  Vitals:   07/12/23 0929  BP: 139/77  Pulse: (!) 58  Resp: 18  Temp: 98.2 F (36.8 C)  SpO2: 98%   Filed Weights   07/12/23 0929  Weight: 141 lb 15.6 oz (64.4 kg)   Physical Exam  PAST MEDICAL/SURGICAL HISTORY:  Past Medical History:  Diagnosis Date   Acute encephalopathy 12/29/2017   Hospitalized 5/22 to 5/23 with acute encephalopathy, unclear etiology   Acute gout involving toe of right foot 09/14/2019   Alcoholism (HCC)    Allergy    Annual visit for general adult medical examination with abnormal findings 06/04/2020   Anxiety    Arthritis    Back problem    Cataract 01/2021   Chest wall pain 03/28/2017   Chronic hepatitis C without hepatic coma (HCC) 01/04/2009   Qualifier: Diagnosis of  By: De Blanch. 2008: CT A/P NO CIRRHOSIS AUG 2013 MRCP- DILATED CBD/NO CIRRHOSIS DEC 2013 AST 99-102  ALT 145-156 PLT 147 HB 13.4 ALB 4.4-4.7 T BIL 0.7    Depression    Dilation of biliary tract 07/14/2012   EUS JAN 2014 BENIGN CBD DILATION    Epigastric pain 07/14/2012   GERD (gastroesophageal reflux disease)    Gout    HCV (hepatitis C virus) 06/16/2015   Hepatitis B    Hepatitis C 1979   Hepatitis C reactive    LIVER BX 2008-CHRONIC ACTIVE HEPATITIS   HTN (hypertension)    Hx of adenomatous colonic polyps 2008   due for surveillance 2017   Hypercholesterolemia    IBS (irritable bowel syndrome)    Iron deficiency anemia 03/13/2022   Irritable bowel syndrome 01/04/2009   Qualifier: Diagnosis of  By: De Blanch.    Knee pain    Left forearm pain 08/15/2017   Leukopenia 11/06/2015   Lichen planus    Narcotic addiction (HCC) 06/10/2011   [facility]- Oct 2012    Normal cardiac stress test 05/25/2011   [facility]    Normal echocardiogram 05/25/2011   EF 55%, mild TR   Oral lichen planus    Plaque psoriasis    RUQ pain 07/14/2012   Sjogren's disease (HCC)    Status post dilation of esophageal narrowing    Substance abuse (HCC)    narcotic addiction   Thrombocytopenia (HCC) 11/06/2015   Trigger middle finger of left hand 06/20/2012   Past Surgical History:  Procedure Laterality Date   24 HOUR PH STUDY  10/21/2020   Procedure: 24 HOUR PH STUDY;  Surgeon: Napoleon Form, MD;  Location: [facility];  Service: Endoscopy;;   APPENDECTOMY     BALLOON DILATION N/A 05/28/2020   Procedure: [place];  Surgeon: Lanelle Bal, DO;  [facility];  Service: Endoscopy;  Laterality: N/A;   BIOPSY  05/28/2020   Procedure: BIOPSY;  Surgeon: Lanelle Bal, DO;  [facility];  Service: Endoscopy;;   BIOPSY  09/07/2022   Procedure: BIOPSY;  Surgeon: Lanelle Bal, DO;  [facility];  Service: Endoscopy;;  CARPAL TUNNEL RELEASE     rt   COLONOSCOPY  2008 SLF ARS D100 V8 PHEN 12.5   2 SIMPLE ADENOMAS (< 1 CM)   COLONOSCOPY WITH PROPOFOL N/A 12/05/2019   TI normal, 3 mm polyp in ascending colon, external and internal hemorrhoids. Polyp removed but not retrieved. Poor prep   COLONOSCOPY WITH PROPOFOL N/A 09/07/2022   Procedure: COLONOSCOPY WITH PROPOFOL;  Surgeon: Lanelle Bal, DO;  [facility];  Service: Endoscopy;  Laterality: N/A;  9:15 AM   ESOPHAGEAL MANOMETRY N/A 10/21/2020   Procedure: ESOPHAGEAL MANOMETRY (EM);  Surgeon: Napoleon Form, MD;  Location: [facility];  Service: Endoscopy;  Laterality: N/A;   ESOPHAGOGASTRODUODENOSCOPY  04/26/2012   Dr. Darrick Penna: Non-erosive gastritis (inflammation) was found in the gastric antrum but no H.pylori; multiple biopsies (duodenal bx negative for Celiac)/ The mucosa of the esophagus appeared normal   ESOPHAGOGASTRODUODENOSCOPY  07/28/2021   Reports that his health; normal exam s/p esophageal  dilation, esophageal biopsies obtained and were benign.   ESOPHAGOGASTRODUODENOSCOPY (EGD) WITH PROPOFOL N/A 12/05/2019   mild gastritis, duodenitis due to aspirin/NSAIDs.    ESOPHAGOGASTRODUODENOSCOPY (EGD) WITH PROPOFOL N/A 05/28/2020   Procedure: ESOPHAGOGASTRODUODENOSCOPY (EGD) WITH PROPOFOL;  Surgeon: Lanelle Bal, DO;  [facility];  Service: Endoscopy;  Laterality: N/A;  2:30pm   ESOPHAGOGASTRODUODENOSCOPY (EGD) WITH PROPOFOL N/A 09/07/2022   Procedure: ESOPHAGOGASTRODUODENOSCOPY (EGD) WITH PROPOFOL;  Surgeon: Lanelle Bal, DO;  [facility];  Service: Endoscopy;  Laterality: N/A;   EUS  09/08/2012   Dr. Christella Hartigan: CBD dilated but no stones. Query secondary to Sphincter of Oddi stenosis, ?dysfunction, but clinically without symptoms   FRACTURE SURGERY N/A    [employer] 08/25/2020   HARDWARE REMOVAL Right 02/12/2014   Procedure: HARDWARE REMOVAL;  Surgeon: Jodi Marble, MD;  Location: [facility];  Service: Orthopedics;  Laterality: Right;   KNEE ARTHROSCOPY     Neurostimulator implant     POLYPECTOMY  12/05/2019   Procedure: POLYPECTOMY;  Surgeon: West Bali, MD;  [facility];  Service: Endoscopy;;   POLYPECTOMY  09/07/2022   Procedure: POLYPECTOMY;  Surgeon: Lanelle Bal, DO;  [facility];  Service: Endoscopy;;   right ring finger     spinal stenosis, had screws put in neck  04/11/2009   DR KRITZER   SPINE SURGERY     TONSILLECTOMY     ULNA OSTEOTOMY Right 02/12/2014   Procedure: RIGHT ULNAR SHORTENING AND OSTEOTOMY ;  Surgeon: Jodi Marble, MD;  Location: [facility];  Service: Orthopedics;  Laterality: Right;   ULNAR NERVE TRANSPOSITION     UPPER GASTROINTESTINAL ENDOSCOPY  2008 SLF ABD PAIN WEIGHT LOSS d100 v8 phen 12.5   NL   WRIST SURGERY Right 08/2013   Dr. Margarita Rana    SOCIAL HISTORY:  Social History   Socioeconomic History   Marital status: Divorced    Spouse name:  Not on file   Number of children: 1   Years of education: Not on file   Highest education level: Associate degree: occupational, Scientist, product/process development, or vocational program  Occupational History   Occupation: Disabled/retired  Tobacco Use   Smoking status: Former    Current packs/day: 0.00    Average packs/day: 1.5 packs/day for 8.0 years (12.0 ttl pk-yrs)    Types: Cigarettes    Start date: 08/13/1978    Quit date: 08/13/1986    Years since quitting: 36.9    Passive exposure: Current  Smokeless tobacco: Never   Tobacco comments:    quit 20 + yrs ago  Vaping Use   Vaping status: Never Used  Substance and Sexual Activity   Alcohol use: Not Currently    Comment: Used to drink heavily- went to treatment in May 2020. No alcohol in 3 years (03/13/2022)   Drug use: Not Currently    Comment: pain medications, klonopin- none in the last 17 months. Currently in AA   Sexual activity: Not Currently  Other Topics Concern   Not on file  Social History Narrative   Not on file   Social Determinants of Health   Financial Resource Strain: Low Risk  (06/09/2023)   Received from [facility]   Overall Financial Resource Strain (CARDIA)    Difficulty of Paying Living Expenses: Not hard at all  Food Insecurity: No Food Insecurity (06/09/2023)   Received from [facility]   Hunger Vital Sign    Worried About Running Out of Food in the Last Year: Never true    Ran Out of Food in the Last Year: Never true  Transportation Needs: No Transportation Needs (06/09/2023)   Received from [facility] - Transportation    Lack of Transportation (Medical): No    Lack of Transportation (Non-Medical): No  Physical Activity: Sufficiently Active (03/29/2023)   Exercise Vital Sign    Days of Exercise per Week: 7 days    Minutes of Exercise per Session: 150+ min  Stress: Stress Concern Present (03/29/2023)   [employer] of Occupational [employer] - Occupational Stress Questionnaire    Feeling of Stress : To  some extent  Social Connections: Unknown (06/08/2023)   Received from [facility]   Social Network    Social Network: Not on file  Recent Concern: Social Connections - Moderately Isolated (03/29/2023)   Social Connection and Isolation Panel [NHANES]    Frequency of Communication with Friends and Family: More than three times a week    Frequency of Social Gatherings with Friends and Family: More than three times a week    Attends Religious Services: Never    Database administrator or Organizations: Yes    Attends Engineer, structural: More than 4 times per year    Marital Status: Divorced  Intimate Partner Violence: Unknown (06/08/2023)   Received from [facility]   HITS    Physically Hurt: Not on file    Insult or Talk Down To: Not on file    Threaten Physical Harm: Not on file    Scream or Curse: Not on file    FAMILY HISTORY:  Family History  Problem Relation Age of Onset   Bladder Cancer Mother        rare form    Cancer Sister        Metastatic abdominal   Emotional abuse Sister    Stroke Sister    Emotional abuse Sister    Heart failure Father    Stroke Father    Alcohol abuse Father    Dementia Paternal Aunt    Alcohol abuse Paternal Uncle    Dementia Paternal Uncle    Dementia Paternal Grandmother    Stroke Paternal Grandmother    ADD / ADHD Cousin    Bipolar disorder Cousin    Anxiety disorder Cousin    Depression Cousin        Committed suicide   Alcohol abuse Cousin    OCD Cousin    Paranoid behavior Cousin  Brain cancer Maternal Grandfather    Heart defect Other        FAMILY HX   Colon cancer Maternal Uncle    Colon cancer Cousin    Colon polyps Neg Hx    Drug abuse Neg Hx    Schizophrenia Neg Hx    Seizures Neg Hx    Sexual abuse Neg Hx    Physical abuse Neg Hx     CURRENT MEDICATIONS:  Outpatient Encounter Medications as of 07/12/2023  Medication Sig   AMBULATORY NON FORMULARY MEDICATION Kratom Take 2 tablet by mouth twice  daily   AMBULATORY NON FORMULARY MEDICATION Friska caffiene 1-2 tablet twice daily   amLODipine (NORVASC) 5 MG tablet TAKE ONE TABLET BY MOUTH ONCE DAILY.   aspirin EC 81 MG tablet Take 81 mg by mouth daily.   baclofen (LIORESAL) 10 MG tablet TAKE (1/2) TABLET (5MG ) BY MOUTH TWICE DAILY BEFORE A MEAL.   cevimeline (EVOXAC) 30 MG capsule Take 30 mg by mouth 2 (two) times daily.   copper tablet Take 3 tablets (6 mg total) by mouth daily.   escitalopram (LEXAPRO) 10 MG tablet Take 1 tablet (10 mg total) by mouth daily.   famotidine (PEPCID) 20 MG tablet Take 1 tablet (20 mg total) by mouth 2 (two) times daily.   ferrous sulfate 325 (65 FE) MG tablet Take 325 mg by mouth daily with breakfast.   gabapentin (NEURONTIN) 100 MG capsule Take by mouth.   hydroxychloroquine (PLAQUENIL) 200 MG tablet Take 1 tablet by mouth daily.   hyoscyamine (LEVSIN) 0.125 MG tablet Take by mouth every 4 (four) hours as needed.   lubiprostone (AMITIZA) 24 MCG capsule TAKE 1 CAPSULE TWICE DAILY WITH MEALS.   pilocarpine (SALAGEN) 5 MG tablet Take 5 mg by mouth 3 (three) times daily.   RABEprazole (ACIPHEX) 20 MG tablet Take 20 mg by mouth daily.   senna (SENOKOT) 8.6 MG tablet Take 2 tablets by mouth 2 (two) times daily.   sucralfate (CARAFATE) 1 g tablet Take 1 tablet (1 g total) by mouth 4 (four) times daily.   Turmeric 400 MG CAPS Take 800 mg by mouth daily.    Vitamin D3 (VITAMIN D) 25 MCG tablet Take 4,000 Units by mouth daily.    No facility-administered encounter medications on file as of 07/12/2023.    ALLERGIES:  Allergies  Allergen Reactions   Levofloxacin Nausea Only and Other (See Comments)    "Creepy" feeling, skin didn't feel right, sort of itchy.   Ciprofloxacin     Sweating   Sulfa Antibiotics     LABORATORY DATA:  I have reviewed the labs as listed.  CBC    Component Value Date/Time   WBC 6.5 07/05/2023 1049   RBC 4.83 07/05/2023 1049   HGB 14.3 07/05/2023 1049   HGB 13.7 10/15/2020  1021   HCT 44.3 07/05/2023 1049   HCT 39.2 10/15/2020 1021   PLT 184 07/05/2023 1049   PLT 140 (L) 10/15/2020 1021   MCV 91.7 07/05/2023 1049   MCV 92 10/15/2020 1021   MCH 29.6 07/05/2023 1049   MCHC 32.3 07/05/2023 1049   RDW 12.8 07/05/2023 1049   RDW 12.0 10/15/2020 1021   LYMPHSABS 1.0 07/05/2023 1049   LYMPHSABS 1.3 10/15/2020 1021   MONOABS 0.4 07/05/2023 1049   EOSABS 0.2 07/05/2023 1049   EOSABS 0.2 10/15/2020 1021   BASOSABS 0.0 07/05/2023 1049   BASOSABS 0.0 10/15/2020 1021      Latest Ref Rng & Units 07/05/2023  10:49 AM 04/15/2023   10:06 AM 09/03/2022   12:55 PM  CMP  Glucose 70 - 99 mg/dL 841  324  401   BUN 8 - 23 mg/dL 18  15  29    Creatinine 0.61 - 1.24 mg/dL 0.27  2.53  6.64   Sodium 135 - 145 mmol/L 139  142  135   Potassium 3.5 - 5.1 mmol/L 3.6  4.1  3.7   Chloride 98 - 111 mmol/L 106  107  102   CO2 22 - 32 mmol/L 25  24  24    Calcium 8.9 - 10.3 mg/dL 9.1  9.7  9.1   Total Protein 6.5 - 8.1 g/dL 7.4  6.7  7.0   Total Bilirubin <1.2 mg/dL 0.5  0.2  0.5   Alkaline Phos 38 - 126 U/L 76  96  57   AST 15 - 41 U/L 29  21  40   ALT 0 - 44 U/L 31  16  41     DIAGNOSTIC IMAGING:  I have independently reviewed the relevant imaging and discussed with the patient.   WRAP UP:  All questions were answered. The patient knows to call the clinic with any problems, questions or concerns.  Medical decision making: Moderate  Time spent on visit: I spent 20 minutes counseling the patient face to face. The total time spent in the appointment was 30 minutes and more than 50% was on counseling.  ABOUBACAR REICHNER, PA-C  07/12/23 10:04 AM

## 2023-07-12 ENCOUNTER — Inpatient Hospital Stay: Payer: Medicare HMO | Attending: Hematology | Admitting: Physician Assistant

## 2023-07-12 VITALS — BP 139/77 | HR 58 | Temp 98.2°F | Resp 18 | Ht 69.0 in | Wt 142.0 lb

## 2023-07-12 DIAGNOSIS — K219 Gastro-esophageal reflux disease without esophagitis: Secondary | ICD-10-CM | POA: Insufficient documentation

## 2023-07-12 DIAGNOSIS — Z79899 Other long term (current) drug therapy: Secondary | ICD-10-CM | POA: Diagnosis not present

## 2023-07-12 DIAGNOSIS — R61 Generalized hyperhidrosis: Secondary | ICD-10-CM | POA: Diagnosis not present

## 2023-07-12 DIAGNOSIS — E610 Copper deficiency: Secondary | ICD-10-CM | POA: Diagnosis not present

## 2023-07-12 DIAGNOSIS — D509 Iron deficiency anemia, unspecified: Secondary | ICD-10-CM | POA: Insufficient documentation

## 2023-07-12 DIAGNOSIS — B1920 Unspecified viral hepatitis C without hepatic coma: Secondary | ICD-10-CM | POA: Insufficient documentation

## 2023-07-12 DIAGNOSIS — M3500 Sicca syndrome, unspecified: Secondary | ICD-10-CM | POA: Diagnosis not present

## 2023-07-12 DIAGNOSIS — I10 Essential (primary) hypertension: Secondary | ICD-10-CM | POA: Insufficient documentation

## 2023-07-12 DIAGNOSIS — G894 Chronic pain syndrome: Secondary | ICD-10-CM | POA: Diagnosis not present

## 2023-07-12 DIAGNOSIS — K589 Irritable bowel syndrome without diarrhea: Secondary | ICD-10-CM | POA: Insufficient documentation

## 2023-07-12 DIAGNOSIS — F1120 Opioid dependence, uncomplicated: Secondary | ICD-10-CM | POA: Diagnosis not present

## 2023-07-12 DIAGNOSIS — Z7982 Long term (current) use of aspirin: Secondary | ICD-10-CM | POA: Insufficient documentation

## 2023-07-12 DIAGNOSIS — Z8052 Family history of malignant neoplasm of bladder: Secondary | ICD-10-CM | POA: Insufficient documentation

## 2023-07-12 DIAGNOSIS — R6883 Chills (without fever): Secondary | ICD-10-CM | POA: Insufficient documentation

## 2023-07-12 DIAGNOSIS — E7800 Pure hypercholesterolemia, unspecified: Secondary | ICD-10-CM | POA: Insufficient documentation

## 2023-07-12 DIAGNOSIS — R778 Other specified abnormalities of plasma proteins: Secondary | ICD-10-CM | POA: Diagnosis not present

## 2023-07-12 DIAGNOSIS — D696 Thrombocytopenia, unspecified: Secondary | ICD-10-CM | POA: Diagnosis not present

## 2023-07-12 DIAGNOSIS — D500 Iron deficiency anemia secondary to blood loss (chronic): Secondary | ICD-10-CM

## 2023-07-12 DIAGNOSIS — Z87891 Personal history of nicotine dependence: Secondary | ICD-10-CM | POA: Diagnosis not present

## 2023-07-12 DIAGNOSIS — G629 Polyneuropathy, unspecified: Secondary | ICD-10-CM | POA: Diagnosis not present

## 2023-07-12 DIAGNOSIS — D72819 Decreased white blood cell count, unspecified: Secondary | ICD-10-CM | POA: Diagnosis not present

## 2023-07-12 LAB — PROTEIN ELECTROPHORESIS, SERUM
A/G Ratio: 1.3 (ref 0.7–1.7)
Albumin ELP: 3.8 g/dL (ref 2.9–4.4)
Alpha-1-Globulin: 0.2 g/dL (ref 0.0–0.4)
Alpha-2-Globulin: 0.8 g/dL (ref 0.4–1.0)
Beta Globulin: 0.9 g/dL (ref 0.7–1.3)
Gamma Globulin: 1.2 g/dL (ref 0.4–1.8)
Globulin, Total: 3 g/dL (ref 2.2–3.9)
Total Protein ELP: 6.8 g/dL (ref 6.0–8.5)

## 2023-07-12 LAB — IMMUNOFIXATION ELECTROPHORESIS
IgA: 116 mg/dL (ref 61–437)
IgG (Immunoglobin G), Serum: 1276 mg/dL (ref 603–1613)
IgM (Immunoglobulin M), Srm: 66 mg/dL (ref 20–172)
Total Protein ELP: 7 g/dL (ref 6.0–8.5)

## 2023-07-12 NOTE — Patient Instructions (Signed)
[facility] at [facility] **VISIT SUMMARY & IMPORTANT INSTRUCTIONS **   You were seen today by Rojelio Brenner PA-C for your follow-up visit.    IRON DEFICIENCY ANEMIA Your iron levels have improved.   You have previously had gastritis/duodenitis (irritation in your stomach and small intestine) from taking NSAID medications.  It is EXTREMELY IMPORTANT that you stop taking all ibuprofen or other NSAID medications at this time. Since your copper levels remain low, STOP taking iron supplement to see if your copper improves.  (Iron can decrease your body's ability to absorb copper.)  LOW COPPER Continue taking copper 6 mg daily  FOLLOW-UP APPOINTMENT: Labs in 4 months with office visit 1 week after  ** Thank you for trusting me with your healthcare!  I strive to provide all of my patients with quality care at each visit.  If you receive a survey for this visit, I would be so grateful to you for taking the time to provide feedback.  Thank you in advance!  ~ Tirsa Gail                   Dr. Doreatha Massed   &   Rojelio Brenner, PA-C   - - - - - - - - - - - - - - - - - -    Thank you for choosing [facility] at [facility] to provide your oncology and hematology care.  To afford each patient quality time with our provider, please arrive at least 15 minutes before your scheduled appointment time.   If you have a lab appointment with the Cancer Center please come in thru the Main Entrance and check in at the main information desk.  You need to re-schedule your appointment should you arrive 10 or more minutes late.  We strive to give you quality time with our providers, and arriving late affects you and other patients whose appointments are after yours.  Also, if you no show three or more times for appointments you may be dismissed from the clinic at the providers discretion.     Again, thank you for choosing [facility].  Our hope is  that these requests will decrease the amount of time that you wait before being seen by our physicians.       _____________________________________________________________  Should you have questions after your visit to [facility], please contact our office at [phone] and follow the prompts.  Our office hours are 8:00 a.m. and 4:30 p.m. Monday - Friday.  Please note that voicemails left after 4:00 p.m. may not be returned until the following business day.  We are closed weekends and major holidays.  You do have access to a nurse 24-7, just call the main number to the clinic [phone] and do not press any options, hold on the line and a nurse will answer the phone.    For prescription refill requests, have your pharmacy contact our office and allow 72 hours.

## 2023-07-16 ENCOUNTER — Encounter: Payer: Self-pay | Admitting: *Deleted

## 2023-07-16 DIAGNOSIS — K219 Gastro-esophageal reflux disease without esophagitis: Secondary | ICD-10-CM

## 2023-07-16 LAB — ACHR ALL WITH REFLEX TO MUSK
AChR Binding Ab, Serum: <0.03 nmol/L (ref 0.00–0.24)
AChR-modulating Ab: 0 % (ref 0–45)
Acetylchol Block Ab: 19 % (ref 0–25)

## 2023-07-16 LAB — MUSK ANTIBODIES: MuSK Antibodies: <1 U/mL

## 2023-07-18 MED ORDER — BACLOFEN 10 MG PO TABS: ORAL_TABLET | ORAL | 11 refills | Status: DC

## 2023-07-19 NOTE — Telephone Encounter (Signed)
Noted. Informed pt.  

## 2023-08-02 ENCOUNTER — Other Ambulatory Visit: Payer: Self-pay | Admitting: Family Medicine

## 2023-08-03 DIAGNOSIS — K219 Gastro-esophageal reflux disease without esophagitis: Secondary | ICD-10-CM | POA: Diagnosis not present

## 2023-08-03 DIAGNOSIS — R1319 Other dysphagia: Secondary | ICD-10-CM | POA: Diagnosis not present

## 2023-08-05 ENCOUNTER — Other Ambulatory Visit: Payer: Self-pay | Admitting: Family Medicine

## 2023-08-12 DIAGNOSIS — K219 Gastro-esophageal reflux disease without esophagitis: Secondary | ICD-10-CM | POA: Diagnosis not present

## 2023-08-12 DIAGNOSIS — R1319 Other dysphagia: Secondary | ICD-10-CM | POA: Diagnosis not present

## 2023-08-16 DIAGNOSIS — R8589 Other abnormal findings in specimens from digestive organs and abdominal cavity: Secondary | ICD-10-CM | POA: Diagnosis not present

## 2023-08-16 DIAGNOSIS — B379 Candidiasis, unspecified: Secondary | ICD-10-CM | POA: Diagnosis not present

## 2023-08-16 DIAGNOSIS — K219 Gastro-esophageal reflux disease without esophagitis: Secondary | ICD-10-CM | POA: Diagnosis not present

## 2023-08-16 DIAGNOSIS — K117 Disturbances of salivary secretion: Secondary | ICD-10-CM | POA: Diagnosis not present

## 2023-08-17 DIAGNOSIS — Z79899 Other long term (current) drug therapy: Secondary | ICD-10-CM | POA: Diagnosis not present

## 2023-08-17 DIAGNOSIS — M3500 Sicca syndrome, unspecified: Secondary | ICD-10-CM | POA: Diagnosis not present

## 2023-08-18 ENCOUNTER — Encounter: Payer: Self-pay | Admitting: Internal Medicine

## 2023-08-18 ENCOUNTER — Ambulatory Visit: Payer: Medicare HMO | Admitting: Internal Medicine

## 2023-08-18 ENCOUNTER — Telehealth: Payer: Self-pay | Admitting: *Deleted

## 2023-08-18 VITALS — BP 131/81 | HR 63 | Temp 97.9°F | Ht 69.0 in | Wt 142.7 lb

## 2023-08-18 DIAGNOSIS — K838 Other specified diseases of biliary tract: Secondary | ICD-10-CM

## 2023-08-18 DIAGNOSIS — R1013 Epigastric pain: Secondary | ICD-10-CM | POA: Diagnosis not present

## 2023-08-18 DIAGNOSIS — R1310 Dysphagia, unspecified: Secondary | ICD-10-CM | POA: Diagnosis not present

## 2023-08-18 DIAGNOSIS — K5900 Constipation, unspecified: Secondary | ICD-10-CM

## 2023-08-18 DIAGNOSIS — K5909 Other constipation: Secondary | ICD-10-CM

## 2023-08-18 DIAGNOSIS — G8929 Other chronic pain: Secondary | ICD-10-CM

## 2023-08-18 DIAGNOSIS — K219 Gastro-esophageal reflux disease without esophagitis: Secondary | ICD-10-CM

## 2023-08-18 MED ORDER — LIDOCAINE VISCOUS HCL 2 % MT SOLN: 15.0000 mL | Freq: Four times a day (QID) | OROMUCOSAL | 2 refills | Status: AC | PRN

## 2023-08-18 NOTE — Patient Instructions (Signed)
 I am going to send in viscous lidocaine  to your pharmacy to see if this helps with your discomfort.  We have to redo prior authorization for your MRI which Graeme is working on today.  Once we have approval, someone from radiology will call you to get this scheduled.  Continue follow-up with esophageal clinic at [facility].  It was very nice seeing you again today.  Dr. Cindie

## 2023-08-18 NOTE — Telephone Encounter (Signed)
 PA approved via cohere for MRI Authorization #[id number]  DOS: 08/19/2023 - 10/18/2023

## 2023-08-18 NOTE — Progress Notes (Signed)
 Referring Provider: Antonetta Rollene BRAVO, MD Primary Care Physician:  Antonetta Rollene BRAVO, MD Primary GI:  Dr. Cindie  Chief Complaint  Patient presents with   Dysphagia    Follow up dysphagia. Having some abdominal pain.     HPI:   Marc [name] is a 68 y.o. male who presents to the clinic today for follow up visit.  Complicated history of chronic dysphagia, GERD, hepatitis C status posttreatment with Harvoni  with subsequent SVR, Sjogren's  Esophageal dysphagia:  Followed at esophageal clinic at [facility] [place].  pH test results were borderline for pathological GERD, indicating that his reflux is not severe.   Gastric emptying study yielded normal results.   Esophageal manometry suggested a potential esophagogastric junction (EGJ) outflow obstruction.   Esophagram 08/12/23 Esophagus: Normal mucosa. No evidence of mass or stricture. Normal distensibility, motility and contour. Tablet: Unimpeded transit with no significant stagnation.   Today continues complaining of worsening dysphagia.  Also notes epigastric discomfort occurs daily.  Feels like pressure.  Chronic hepatitis C: Status posttreatment with Harvoni  with subsequent SVR, elastography F3/F4 prior to treatment. Posttreatment elastography with K PA 6.2 in February 2021. Elastography repeated August 2023 with median K PA of 5.4.   Dilated common bile duct: Right upper quadrant ultrasound 04/30/2023 hepatic steatosis and dilated CBD with recommendations for MRI/MRCP to further evaluate, mild gallbladder wall thickening.   MRCP delayed because he has a neurostimulator in place with a dead battery, MRI requires battery to be charged and placed in MRI mode for them to proceed. Patient reports that he has gotten approval to pursue MRI, letter scanned into chart 06/30/23   Past Medical History:  Diagnosis Date   Acute encephalopathy 12/29/2017   Hospitalized 5/22 to 5/23 with acute encephalopathy, unclear  etiology   Acute gout involving toe of right foot 09/14/2019   Alcoholism (HCC)    Allergy    Annual visit for general adult medical examination with abnormal findings 06/04/2020   Anxiety    Arthritis    Back problem    Cataract 01/2021   Chest wall pain 03/28/2017   Chronic hepatitis C without hepatic coma (HCC) 01/04/2009   Qualifier: Diagnosis of  By: Ezzard DEVONNA Sonny GORMAN. 2008: CT A/P NO CIRRHOSIS AUG 2013 MRCP- DILATED CBD/NO CIRRHOSIS DEC 2013 AST 99-102  ALT 145-156 PLT 147 HB 13.4 ALB 4.4-4.7 T BIL 0.7    Depression    Dilation of biliary tract 07/14/2012   EUS JAN 2014 BENIGN CBD DILATION    Epigastric pain 07/14/2012   GERD (gastroesophageal reflux disease)    Gout    HCV (hepatitis C virus) 06/16/2015   Hepatitis B    Hepatitis C 1979   Hepatitis C reactive    LIVER BX 2008-CHRONIC ACTIVE HEPATITIS   HTN (hypertension)    Hx of adenomatous colonic polyps 2008   due for surveillance 2017   Hypercholesterolemia    IBS (irritable bowel syndrome)    Iron  deficiency anemia 03/13/2022   Irritable bowel syndrome 01/04/2009   Qualifier: Diagnosis of  By: Ezzard DEVONNA Sonny GORMAN.    Knee pain    Left forearm pain 08/15/2017   Leukopenia 11/06/2015   Lichen planus    Narcotic addiction (HCC) 06/10/2011   [facility]- Oct 2012    Normal cardiac stress test 05/25/2011   [facility] [place]   Normal echocardiogram 05/25/2011   EF 55%, mild TR   Oral lichen planus  Plaque psoriasis    RUQ pain 07/14/2012   Sjogren's disease (HCC)    Status post dilation of esophageal narrowing    Substance abuse (HCC)    narcotic addiction   Thrombocytopenia (HCC) 11/06/2015   Trigger middle finger of left hand 06/20/2012    Past Surgical History:  Procedure Laterality Date   21 HOUR PH STUDY  10/21/2020   Procedure: 24 HOUR PH STUDY;  Surgeon: Shila Gustav GAILS, MD;  Location: [facility];  Service: Endoscopy;;   APPENDECTOMY     BALLOON DILATION N/A  05/28/2020   Procedure: MERRILL HODGKIN;  Surgeon: Cindie Carlin POUR, DO;  [facility];  Service: Endoscopy;  Laterality: N/A;   BIOPSY  05/28/2020   Procedure: BIOPSY;  Surgeon: Cindie Carlin POUR, DO;  [facility];  Service: Endoscopy;;   BIOPSY  09/07/2022   Procedure: BIOPSY;  Surgeon: Cindie Carlin POUR, DO;  [facility];  Service: Endoscopy;;   CARPAL TUNNEL RELEASE     rt   COLONOSCOPY  2008 SLF ARS D100 V8 PHEN 12.5   2 SIMPLE ADENOMAS (< 1 CM)   COLONOSCOPY WITH PROPOFOL  N/A 12/05/2019   TI normal, 3 mm polyp in ascending colon, external and internal hemorrhoids. Polyp removed but not retrieved. Poor prep   COLONOSCOPY WITH PROPOFOL  N/A 09/07/2022   Procedure: COLONOSCOPY WITH PROPOFOL ;  Surgeon: Cindie Carlin POUR, DO;  [facility];  Service: Endoscopy;  Laterality: N/A;  9:15 AM   ESOPHAGEAL MANOMETRY N/A 10/21/2020   Procedure: ESOPHAGEAL MANOMETRY (EM);  Surgeon: Shila Gustav GAILS, MD;  Location: [facility];  Service: Endoscopy;  Laterality: N/A;   ESOPHAGOGASTRODUODENOSCOPY  04/26/2012   Dr. Harvey: Non-erosive gastritis (inflammation) was found in the gastric antrum but no H.pylori; multiple biopsies (duodenal bx negative for Celiac)/ The mucosa of the esophagus appeared normal   ESOPHAGOGASTRODUODENOSCOPY  07/28/2021   Reports that his health; normal exam s/p esophageal dilation, esophageal biopsies obtained and were benign.   ESOPHAGOGASTRODUODENOSCOPY (EGD) WITH PROPOFOL  N/A 12/05/2019   mild gastritis, duodenitis due to aspirin /NSAIDs.    ESOPHAGOGASTRODUODENOSCOPY (EGD) WITH PROPOFOL  N/A 05/28/2020   Procedure: ESOPHAGOGASTRODUODENOSCOPY (EGD) WITH PROPOFOL ;  Surgeon: Cindie Carlin POUR, DO;  [facility];  Service: Endoscopy;  Laterality: N/A;  2:30pm   ESOPHAGOGASTRODUODENOSCOPY (EGD) WITH PROPOFOL  N/A 09/07/2022   Procedure: ESOPHAGOGASTRODUODENOSCOPY (EGD) WITH PROPOFOL ;  Surgeon: Cindie Carlin POUR, DO;   [facility];  Service: Endoscopy;  Laterality: N/A;   EUS  09/08/2012   Dr. Teressa: CBD dilated but no stones. Query secondary to Sphincter of Oddi stenosis, ?dysfunction, but clinically without symptoms   FRACTURE SURGERY N/A    Phreesia 08/25/2020   HARDWARE REMOVAL Right 02/12/2014   Procedure: HARDWARE REMOVAL;  Surgeon: Alm DELENA Hummer, MD;  Location: [facility];  Service: Orthopedics;  Laterality: Right;   KNEE ARTHROSCOPY     Neurostimulator implant     POLYPECTOMY  12/05/2019   Procedure: POLYPECTOMY;  Surgeon: Harvey Margo CROME, MD;  [facility];  Service: Endoscopy;;   POLYPECTOMY  09/07/2022   Procedure: POLYPECTOMY;  Surgeon: Cindie Carlin POUR, DO;  [facility];  Service: Endoscopy;;   right ring finger     spinal stenosis, had screws put in neck  04/11/2009   DR KRITZER   SPINE SURGERY     TONSILLECTOMY     ULNA OSTEOTOMY Right 02/12/2014   Procedure: RIGHT ULNAR SHORTENING AND OSTEOTOMY ;  Surgeon: Alm DELENA Hummer,  MD;  Location: [facility];  Service: Orthopedics;  Laterality: Right;   ULNAR NERVE TRANSPOSITION     UPPER GASTROINTESTINAL ENDOSCOPY  2008 SLF ABD PAIN WEIGHT LOSS d100 v8 phen 12.5   NL   WRIST SURGERY Right 08/2013   Dr. Evalene Chancy    Current Outpatient Medications  Medication Sig Dispense Refill   AMBULATORY NON FORMULARY MEDICATION Kratom Take 2 tablet by mouth twice daily     AMBULATORY NON FORMULARY MEDICATION Friska caffiene 1-2 tablet twice daily     amLODipine  (NORVASC ) 5 MG tablet TAKE ONE TABLET BY MOUTH ONCE DAILY. 30 tablet 0   baclofen  (LIORESAL ) 10 MG tablet TAKE (1/2) TABLET (5MG ) BY MOUTH THREE TIMES DAILY BEFORE A MEAL. IF TOLERATED AFTER THREE DAY, YOU CAN INCREASE TO 10MG  BEFORE YOUR LARGEST MEAL AND CONTINUE 5MG  BEFORE TWO OTHER MEALS FOR TOTAL OF 20MG  DAILY 60 tablet 11   cevimeline  (EVOXAC ) 30 MG capsule Take 30 mg by mouth 2 (two) times daily.     copper  tablet  Take 3 tablets (6 mg total) by mouth daily. 90 tablet 5   escitalopram  (LEXAPRO ) 10 MG tablet Take 1 tablet (10 mg total) by mouth daily. 30 tablet 2   famotidine  (PEPCID ) 20 MG tablet Take 1 tablet (20 mg total) by mouth 2 (two) times daily. 180 tablet 3   gabapentin  (NEURONTIN ) 100 MG capsule Take by mouth.     hydroxychloroquine (PLAQUENIL) 200 MG tablet Take 1 tablet by mouth daily.     hyoscyamine (LEVSIN) 0.125 MG tablet Take by mouth every 4 (four) hours as needed.     lubiprostone  (AMITIZA ) 24 MCG capsule TAKE 1 CAPSULE TWICE DAILY WITH MEALS. 180 capsule 3   OVER THE COUNTER MEDICATION Keto supplement 2 daily     pilocarpine (SALAGEN) 5 MG tablet Take 5 mg by mouth 3 (three) times daily.     RABEprazole  (ACIPHEX ) 20 MG tablet Take 20 mg by mouth daily.     senna (SENOKOT) 8.6 MG tablet Take 2 tablets by mouth 2 (two) times daily.     sucralfate  (CARAFATE ) 1 g tablet Take 1 tablet (1 g total) by mouth 4 (four) times daily. 120 tablet 5   Vitamin D3 (VITAMIN D ) 25 MCG tablet Take 4,000 Units by mouth daily.      aspirin  EC 81 MG tablet Take 81 mg by mouth daily. (Patient not taking: Reported on 08/18/2023)     No current facility-administered medications for this visit.    Allergies as of 08/18/2023 - Review Complete 08/18/2023  Allergen Reaction Noted   Levofloxacin Nausea Only and Other (See Comments)    Ciprofloxacin  06/13/2015   Sulfa  antibiotics  12/02/2012    Family History  Problem Relation Age of Onset   Bladder Cancer Mother        rare form    Cancer Sister        Metastatic abdominal   Emotional abuse Sister    Stroke Sister    Emotional abuse Sister    Heart failure Father    Stroke Father    Alcohol abuse Father    Dementia Paternal Aunt    Alcohol abuse Paternal Uncle    Dementia Paternal Uncle    Dementia Paternal Grandmother    Stroke Paternal Grandmother    ADD / ADHD Cousin    Bipolar disorder Cousin    Anxiety disorder Cousin    Depression Cousin         Committed suicide  Alcohol abuse Cousin    OCD Cousin    Paranoid behavior Cousin    Brain cancer Maternal Grandfather    Heart defect Other        FAMILY HX   Colon cancer Maternal Uncle    Colon cancer Cousin    Colon polyps Neg Hx    Drug abuse Neg Hx    Schizophrenia Neg Hx    Seizures Neg Hx    Sexual abuse Neg Hx    Physical abuse Neg Hx     Social History   Socioeconomic History   Marital status: Divorced    Spouse name: Not on file   Number of children: 1   Years of education: Not on file   Highest education level: Associate degree: occupational, scientist, product/process development, or vocational program  Occupational History   Occupation: Disabled/retired  Tobacco Use   Smoking status: Former    Current packs/day: 0.00    Average packs/day: 1.5 packs/day for 8.0 years (12.0 ttl pk-yrs)    Types: Cigarettes    Start date: 08/13/1978    Quit date: 08/13/1986    Years since quitting: 37.0    Passive exposure: Current   Smokeless tobacco: Never   Tobacco comments:    quit 20 + yrs ago  Vaping Use   Vaping status: Never Used  Substance and Sexual Activity   Alcohol use: Not Currently    Comment: Used to drink heavily- went to treatment in May 2020. No alcohol in 3 years (03/13/2022)   Drug use: Not Currently    Comment: pain medications, klonopin - none in the last 17 months. Currently in AA   Sexual activity: Not Currently  Other Topics Concern   Not on file  Social History Narrative   Not on file   Social Drivers of Health   Financial Resource Strain: Low Risk  (06/09/2023)   Received from [facility]   Overall Financial Resource Strain (CARDIA)    Difficulty of Paying Living Expenses: Not hard at all  Food Insecurity: No Food Insecurity (06/09/2023)   Received from [facility]   Hunger Vital Sign    Worried About Running Out of Food in the Last Year: Never true    Ran Out of Food in the Last Year: Never true  Transportation Needs: No Transportation Needs (06/09/2023)    Received from [facility] - Transportation    Lack of Transportation (Medical): No    Lack of Transportation (Non-Medical): No  Physical Activity: Sufficiently Active (03/29/2023)   Exercise Vital Sign    Days of Exercise per Week: 7 days    Minutes of Exercise per Session: 150+ min  Stress: Stress Concern Present (03/29/2023)   [employer] - Occupational Stress Questionnaire    Feeling of Stress : To some extent  Social Connections: Unknown (06/08/2023)   Received from [facility]   Social Network    Social Network: Not on file  Recent Concern: Social Connections - Moderately Isolated (03/29/2023)   Social Connection and Isolation Panel [NHANES]    Frequency of Communication with Friends and Family: More than three times a week    Frequency of Social Gatherings with Friends and Family: More than three times a week    Attends Religious Services: Never    Database Administrator or Organizations: Yes    Attends Engineer, Structural: More than 4 times per year    Marital Status: Divorced    Subjective: Review of Systems  Constitutional:  Negative for chills and fever.  HENT:  Negative for congestion and hearing loss.   Eyes:  Negative for blurred vision and double vision.  Respiratory:  Negative for cough and shortness of breath.   Cardiovascular:  Negative for chest pain and palpitations.  Gastrointestinal:  Positive for heartburn. Negative for abdominal pain, blood in stool, constipation, diarrhea, melena and vomiting.       Epigastric pain, dysphagia  Genitourinary:  Negative for dysuria and urgency.  Musculoskeletal:  Negative for joint pain and myalgias.  Skin:  Negative for itching and rash.  Neurological:  Negative for dizziness and headaches.  Psychiatric/Behavioral:  Negative for depression. The patient is not nervous/anxious.      Objective: BP 131/81   Pulse 63   Temp 97.9 F (36.6 C)   Ht 5' 9 (1.753 m)   Wt  142 lb 11.2 oz (64.7 kg)   BMI 21.07 kg/m  Physical Exam Constitutional:      Appearance: Normal appearance.  HENT:     Head: Normocephalic and atraumatic.  Eyes:     Extraocular Movements: Extraocular movements intact.     Conjunctiva/sclera: Conjunctivae normal.  Cardiovascular:     Rate and Rhythm: Normal rate and regular rhythm.  Pulmonary:     Effort: Pulmonary effort is normal.     Breath sounds: Normal breath sounds.  Abdominal:     General: Bowel sounds are normal.     Palpations: Abdomen is soft.  Musculoskeletal:        General: Normal range of motion.     Cervical back: Normal range of motion and neck supple.  Skin:    General: Skin is warm.  Neurological:     General: No focal deficit present.     Mental Status: He is alert and oriented to person, place, and time.  Psychiatric:        Mood and Affect: Mood normal.        Behavior: Behavior normal.      Assessment/Plan:  1.  Esophageal dysphagia, epigastric pain-chronic, following with esophageal clinic at [facility].  Recommend he continue follow-up with then in this regard.  Plans for EGD with dilation, Bravo, Endoflip in a few months.  Continue current medications, will trial on as needed viscous lidocaine  to see if this helps with discomfort.  2.  Dilated common bile duct-MRI/MRCP delayed because he has a neurostimulator in place with a dead battery, MRI requires battery to be charged and placed in MRI mode for them to proceed.  Patient states he got approval from spine clinic in [facility], this has been scanned into his chart.  We will work on getting this scheduled.  3.  Chronic constipation-well-controlled on Amitiza .  Will continue.  Follow-up in 3 months  08/18/2023 11:46 AM   Disclaimer: This note was dictated with voice recognition software. Similar sounding words can inadvertently be transcribed and may not be corrected upon review.

## 2023-08-25 ENCOUNTER — Ambulatory Visit (HOSPITAL_COMMUNITY)
Admission: RE | Admit: 2023-08-25 | Discharge: 2023-08-25 | Disposition: A | Discharge status: Home / Self Care | Payer: Medicare HMO | Source: Ambulatory Visit | Attending: Gastroenterology | Admitting: Gastroenterology

## 2023-08-25 DIAGNOSIS — K838 Other specified diseases of biliary tract: Secondary | ICD-10-CM | POA: Insufficient documentation

## 2023-08-25 DIAGNOSIS — D509 Iron deficiency anemia, unspecified: Secondary | ICD-10-CM | POA: Insufficient documentation

## 2023-08-25 DIAGNOSIS — R1010 Upper abdominal pain, unspecified: Secondary | ICD-10-CM | POA: Diagnosis not present

## 2023-08-25 DIAGNOSIS — K5900 Constipation, unspecified: Secondary | ICD-10-CM | POA: Diagnosis not present

## 2023-08-25 DIAGNOSIS — K219 Gastro-esophageal reflux disease without esophagitis: Secondary | ICD-10-CM | POA: Diagnosis not present

## 2023-08-25 MED ORDER — GADOBUTROL 1 MMOL/ML IV SOLN
6.5000 mL | Freq: Once | INTRAVENOUS | Status: AC | PRN
  Administered 2023-08-25: 6.5 mL via INTRAVENOUS

## 2023-09-04 ENCOUNTER — Other Ambulatory Visit: Payer: Self-pay | Admitting: Family Medicine

## 2023-09-20 DIAGNOSIS — K449 Diaphragmatic hernia without obstruction or gangrene: Secondary | ICD-10-CM | POA: Diagnosis not present

## 2023-09-20 DIAGNOSIS — Z7982 Long term (current) use of aspirin: Secondary | ICD-10-CM | POA: Diagnosis not present

## 2023-09-20 DIAGNOSIS — K3189 Other diseases of stomach and duodenum: Secondary | ICD-10-CM | POA: Diagnosis not present

## 2023-09-20 DIAGNOSIS — R1319 Other dysphagia: Secondary | ICD-10-CM | POA: Diagnosis not present

## 2023-09-20 DIAGNOSIS — Z87891 Personal history of nicotine dependence: Secondary | ICD-10-CM | POA: Diagnosis not present

## 2023-09-20 DIAGNOSIS — K219 Gastro-esophageal reflux disease without esophagitis: Secondary | ICD-10-CM | POA: Diagnosis not present

## 2023-09-20 DIAGNOSIS — K2950 Unspecified chronic gastritis without bleeding: Secondary | ICD-10-CM | POA: Diagnosis not present

## 2023-09-20 DIAGNOSIS — B1920 Unspecified viral hepatitis C without hepatic coma: Secondary | ICD-10-CM | POA: Diagnosis not present

## 2023-09-20 DIAGNOSIS — R1310 Dysphagia, unspecified: Secondary | ICD-10-CM | POA: Diagnosis not present

## 2023-09-20 DIAGNOSIS — Z79899 Other long term (current) drug therapy: Secondary | ICD-10-CM | POA: Diagnosis not present

## 2023-09-21 DIAGNOSIS — K3189 Other diseases of stomach and duodenum: Secondary | ICD-10-CM | POA: Diagnosis not present

## 2023-09-21 DIAGNOSIS — K449 Diaphragmatic hernia without obstruction or gangrene: Secondary | ICD-10-CM | POA: Diagnosis not present

## 2023-09-21 DIAGNOSIS — K219 Gastro-esophageal reflux disease without esophagitis: Secondary | ICD-10-CM | POA: Diagnosis not present

## 2023-09-21 DIAGNOSIS — R1319 Other dysphagia: Secondary | ICD-10-CM | POA: Diagnosis not present

## 2023-09-24 DIAGNOSIS — K219 Gastro-esophageal reflux disease without esophagitis: Secondary | ICD-10-CM | POA: Diagnosis not present

## 2023-10-06 ENCOUNTER — Other Ambulatory Visit: Payer: Self-pay | Admitting: Family Medicine

## 2023-10-08 ENCOUNTER — Other Ambulatory Visit (HOSPITAL_COMMUNITY): Payer: Self-pay | Admitting: Gastroenterology

## 2023-10-08 DIAGNOSIS — T17808S Unspecified foreign body in other parts of respiratory tract causing other injury, sequela: Secondary | ICD-10-CM

## 2023-10-08 DIAGNOSIS — T18108A Unspecified foreign body in esophagus causing other injury, initial encounter: Secondary | ICD-10-CM | POA: Diagnosis not present

## 2023-10-12 DIAGNOSIS — K220 Achalasia of cardia: Secondary | ICD-10-CM | POA: Diagnosis not present

## 2023-10-12 DIAGNOSIS — R1013 Epigastric pain: Secondary | ICD-10-CM | POA: Diagnosis not present

## 2023-10-12 DIAGNOSIS — R1319 Other dysphagia: Secondary | ICD-10-CM | POA: Diagnosis not present

## 2023-10-12 DIAGNOSIS — R071 Chest pain on breathing: Secondary | ICD-10-CM | POA: Diagnosis not present

## 2023-10-12 DIAGNOSIS — R1110 Vomiting, unspecified: Secondary | ICD-10-CM | POA: Diagnosis not present

## 2023-10-19 DIAGNOSIS — K220 Achalasia of cardia: Secondary | ICD-10-CM | POA: Diagnosis not present

## 2023-10-19 DIAGNOSIS — R1319 Other dysphagia: Secondary | ICD-10-CM | POA: Diagnosis not present

## 2023-10-28 ENCOUNTER — Ambulatory Visit (INDEPENDENT_AMBULATORY_CARE_PROVIDER_SITE_OTHER): Payer: Medicare HMO | Admitting: Family Medicine

## 2023-10-28 VITALS — BP 137/74 | HR 62 | Ht 69.0 in | Wt 141.0 lb

## 2023-10-28 DIAGNOSIS — F332 Major depressive disorder, recurrent severe without psychotic features: Secondary | ICD-10-CM | POA: Diagnosis not present

## 2023-10-28 DIAGNOSIS — D509 Iron deficiency anemia, unspecified: Secondary | ICD-10-CM

## 2023-10-28 DIAGNOSIS — R7303 Prediabetes: Secondary | ICD-10-CM | POA: Diagnosis not present

## 2023-10-28 DIAGNOSIS — I10 Essential (primary) hypertension: Secondary | ICD-10-CM

## 2023-10-28 DIAGNOSIS — E559 Vitamin D deficiency, unspecified: Secondary | ICD-10-CM

## 2023-10-28 DIAGNOSIS — M5442 Lumbago with sciatica, left side: Secondary | ICD-10-CM

## 2023-10-28 DIAGNOSIS — K117 Disturbances of salivary secretion: Secondary | ICD-10-CM

## 2023-10-28 DIAGNOSIS — G894 Chronic pain syndrome: Secondary | ICD-10-CM | POA: Diagnosis not present

## 2023-10-28 MED ORDER — CEVIMELINE HCL 30 MG PO CAPS: 30.0000 mg | ORAL_CAPSULE | Freq: Three times a day (TID) | ORAL | 1 refills | Status: DC

## 2023-10-28 MED ORDER — AMLODIPINE BESYLATE 5 MG PO TABS: 5.0000 mg | ORAL_TABLET | Freq: Every day | ORAL | 0 refills | Status: DC

## 2023-10-28 MED ORDER — PREDNISONE 10 MG PO TABS: 10.0000 mg | ORAL_TABLET | Freq: Two times a day (BID) | ORAL | 0 refills | Status: DC

## 2023-10-28 MED ORDER — ONDANSETRON HCL 4 MG PO TABS: ORAL_TABLET | ORAL | 0 refills | Status: DC

## 2023-10-28 NOTE — Patient Instructions (Addendum)
 Annual exam in November, call if you need me sooner  Fasting lipid, cmp and EGFr, TSH, Vit D PSA, CBC 3 to 5 days before November appointment   Prednisone for sciatic pain since yesterday , is sent to start next week after procedure  Zofran, limited amount for your evening nausea is prescribed, if persists you will need to have GI evaluate and manage  Shingrix and tdAP vaccines are recommended and are past due

## 2023-11-01 DIAGNOSIS — M5442 Lumbago with sciatica, left side: Secondary | ICD-10-CM | POA: Insufficient documentation

## 2023-11-01 DIAGNOSIS — R1319 Other dysphagia: Secondary | ICD-10-CM | POA: Diagnosis not present

## 2023-11-01 DIAGNOSIS — R1314 Dysphagia, pharyngoesophageal phase: Secondary | ICD-10-CM | POA: Diagnosis not present

## 2023-11-01 DIAGNOSIS — K219 Gastro-esophageal reflux disease without esophagitis: Secondary | ICD-10-CM | POA: Diagnosis not present

## 2023-11-01 DIAGNOSIS — K3189 Other diseases of stomach and duodenum: Secondary | ICD-10-CM | POA: Diagnosis not present

## 2023-11-01 DIAGNOSIS — K220 Achalasia of cardia: Secondary | ICD-10-CM | POA: Diagnosis not present

## 2023-11-01 DIAGNOSIS — Z7982 Long term (current) use of aspirin: Secondary | ICD-10-CM | POA: Diagnosis not present

## 2023-11-01 DIAGNOSIS — K449 Diaphragmatic hernia without obstruction or gangrene: Secondary | ICD-10-CM | POA: Diagnosis not present

## 2023-11-01 DIAGNOSIS — K2950 Unspecified chronic gastritis without bleeding: Secondary | ICD-10-CM | POA: Diagnosis not present

## 2023-11-01 DIAGNOSIS — Z79899 Other long term (current) drug therapy: Secondary | ICD-10-CM | POA: Diagnosis not present

## 2023-11-01 DIAGNOSIS — B1920 Unspecified viral hepatitis C without hepatic coma: Secondary | ICD-10-CM | POA: Diagnosis not present

## 2023-11-01 NOTE — Assessment & Plan Note (Signed)
 Controlled, no change in medication DASH diet and commitment to daily physical activity for a minimum of 30 minutes discussed and encouraged, as a part of hypertension management. The importance of attaining a healthy weight is also discussed.     10/28/2023    9:34 AM 08/18/2023   11:45 AM 08/18/2023   11:42 AM 07/12/2023    9:29 AM 06/30/2023   10:44 AM 06/30/2023    9:45 AM 06/22/2023    9:08 AM  BP/Weight  Systolic BP 137 131 147 139 120 138 147  Diastolic BP 74 81 77 77 70 69 78  Wt. (Lbs) 141.04  142.7 141.98  141   BMI 20.83 kg/m2  21.07 kg/m2 20.97 kg/m2  20.82 kg/m2

## 2023-11-01 NOTE — Assessment & Plan Note (Signed)
 Chronic , persistent , disabling being managed by medical Specialist who treats his Sjogrens

## 2023-11-01 NOTE — Assessment & Plan Note (Signed)
 Uncontrolled , no interest in herapy or treatment by psychiatry, not suicidal or homicidal, feels miserable all the time because of his cronic illness

## 2023-11-01 NOTE — Assessment & Plan Note (Signed)
 Acute onset after lifting weights, short course of oral prednisone to be taken post upper endo next week if still pressent

## 2023-11-01 NOTE — Assessment & Plan Note (Signed)
 Updated lab needed at/ before next visit.

## 2023-11-01 NOTE — Progress Notes (Signed)
 ALFIO LOESCHER     MRN: [id number]      DOB: 02-Jul-1956  Chief Complaint  Patient presents with   Medical Management of Chronic Issues    4 month follow up Injured back lifting weights yesterday pain is stemming from lower left back down left leg.  Nausea later in the afternoon x 10 days     HPI [name] is here for follow up and re-evaluation of chronic medical conditions, medication management and review of any available recent lab and radiology data.  Preventive health is updated, specifically  Cancer screening and Immunization.   Questions or concerns regarding consultations or procedures which the PT has had in the interim are  addressed. The PT denies any adverse reactions to current medications since the last visit.  Concern as above  ROS Denies recent fever or chills. Denies sinus pressure, nasal congestion, ear pain or sore throat. Denies chest congestion, productive cough or wheezing. Denies chest pains, palpitations and leg swelling Chronic abdominal pain, nausea,    Denies dysuria, frequency, hesitancy or incontinence.  Denies headaches, seizures. Chronic uncontrolled depression, anxiety  Denies skin break down or rash.   PE  BP 137/74   Pulse 62   Ht 5\' 9"  (1.753 m)   Wt 141 lb 0.6 oz (64 kg)   SpO2 97%   BMI 20.83 kg/m   Patient alert and oriented and in no cardiopulmonary distress.  HEENT: No facial asymmetry, EOMI,     Neck supple .  Chest: Clear to auscultation bilaterally.  CVS: S1, S2 no murmurs, no S3.Regular rate.   Ext: No edema  MS: Adequate though reduced  ROM spine, normal in  shoulders, hips and knees.  Skin: Intact, no ulcerations or rash noted.  Psych: Good eye contact, flat  affect. Memory intact not anxious is depressed appearing.  CNS: CN 2-12 intact, power,  normal throughout.no focal deficits noted.   Assessment & Plan  Low back pain with left-sided sciatica Acute onset after lifting weights, short course of oral  prednisone to be taken post upper endo next week if still pressent  Essential hypertension Controlled, no change in medication DASH diet and commitment to daily physical activity for a minimum of 30 minutes discussed and encouraged, as a part of hypertension management. The importance of attaining a healthy weight is also discussed.     10/28/2023    9:34 AM 08/18/2023   11:45 AM 08/18/2023   11:42 AM 07/12/2023    9:29 AM 06/30/2023   10:44 AM 06/30/2023    9:45 AM 06/22/2023    9:08 AM  BP/Weight  Systolic BP 137 131 147 139 120 138 147  Diastolic BP 74 81 77 77 70 69 78  Wt. (Lbs) 141.04  142.7 141.98  141   BMI 20.83 kg/m2  21.07 kg/m2 20.97 kg/m2  20.82 kg/m2        MDD (major depressive disorder) Uncontrolled , no interest in herapy or treatment by psychiatry, not suicidal or homicidal, feels miserable all the time because of his cronic illness  Prediabetes Patient educated about the importance of limiting  Carbohydrate intake , the need to commit to daily physical activity for a minimum of 30 minutes , and to commit weight loss. The fact that changes in all these areas will reduce or eliminate all together the development of diabetes is stressed.      Latest Ref Rng & Units 07/05/2023   10:49 AM 04/15/2023   10:06 AM 09/03/2022  12:55 PM 06/24/2022   11:13 AM 05/27/2022   10:19 AM  Diabetic Labs  Chol 100 - 199 mg/dL  130   865    HDL >78 mg/dL  70   77    Calc LDL 0 - 99 mg/dL  469   629    Triglycerides 0 - 149 mg/dL  60   37    Creatinine 0.61 - 1.24 mg/dL 5.28  4.13  2.44   0.10       10/28/2023    9:34 AM 08/18/2023   11:45 AM 08/18/2023   11:42 AM 07/12/2023    9:29 AM 06/30/2023   10:44 AM 06/30/2023    9:45 AM 06/22/2023    9:08 AM  BP/Weight  Systolic BP 137 131 147 139 120 138 147  Diastolic BP 74 81 77 77 70 69 78  Wt. (Lbs) 141.04  142.7 141.98  141   BMI 20.83 kg/m2  21.07 kg/m2 20.97 kg/m2  20.82 kg/m2        No data to display           Updated lab needed at/ before next visit.   Vitamin D deficiency Updated lab needed at/ before next visit.   Xerostomia Chronic , persistent , disabling being managed by medical Specialist who treats his Sjogrens

## 2023-11-01 NOTE — Assessment & Plan Note (Signed)
 Patient educated about the importance of limiting  Carbohydrate intake , the need to commit to daily physical activity for a minimum of 30 minutes , and to commit weight loss. The fact that changes in all these areas will reduce or eliminate all together the development of diabetes is stressed.      Latest Ref Rng & Units 07/05/2023   10:49 AM 04/15/2023   10:06 AM 09/03/2022   12:55 PM 06/24/2022   11:13 AM 05/27/2022   10:19 AM  Diabetic Labs  Chol 100 - 199 mg/dL  161   096    HDL >04 mg/dL  70   77    Calc LDL 0 - 99 mg/dL  540   981    Triglycerides 0 - 149 mg/dL  60   37    Creatinine 0.61 - 1.24 mg/dL 1.91  4.78  2.95   6.21       10/28/2023    9:34 AM 08/18/2023   11:45 AM 08/18/2023   11:42 AM 07/12/2023    9:29 AM 06/30/2023   10:44 AM 06/30/2023    9:45 AM 06/22/2023    9:08 AM  BP/Weight  Systolic BP 137 131 147 139 120 138 147  Diastolic BP 74 81 77 77 70 69 78  Wt. (Lbs) 141.04  142.7 141.98  141   BMI 20.83 kg/m2  21.07 kg/m2 20.97 kg/m2  20.82 kg/m2        No data to display          Updated lab needed at/ before next visit.

## 2023-11-05 ENCOUNTER — Institutional Professional Consult (permissible substitution): Admitting: Professional Counselor

## 2023-11-05 ENCOUNTER — Other Ambulatory Visit: Payer: Self-pay | Admitting: Family Medicine

## 2023-11-05 DIAGNOSIS — M9905 Segmental and somatic dysfunction of pelvic region: Secondary | ICD-10-CM | POA: Diagnosis not present

## 2023-11-05 DIAGNOSIS — M9902 Segmental and somatic dysfunction of thoracic region: Secondary | ICD-10-CM | POA: Diagnosis not present

## 2023-11-05 DIAGNOSIS — M5442 Lumbago with sciatica, left side: Secondary | ICD-10-CM | POA: Diagnosis not present

## 2023-11-05 DIAGNOSIS — M9903 Segmental and somatic dysfunction of lumbar region: Secondary | ICD-10-CM | POA: Diagnosis not present

## 2023-11-07 ENCOUNTER — Other Ambulatory Visit: Payer: Self-pay | Admitting: Gastroenterology

## 2023-11-10 ENCOUNTER — Inpatient Hospital Stay: Payer: Medicare HMO | Attending: Hematology

## 2023-11-10 DIAGNOSIS — M9902 Segmental and somatic dysfunction of thoracic region: Secondary | ICD-10-CM | POA: Diagnosis not present

## 2023-11-10 DIAGNOSIS — E7800 Pure hypercholesterolemia, unspecified: Secondary | ICD-10-CM | POA: Insufficient documentation

## 2023-11-10 DIAGNOSIS — Z9049 Acquired absence of other specified parts of digestive tract: Secondary | ICD-10-CM | POA: Insufficient documentation

## 2023-11-10 DIAGNOSIS — Z79899 Other long term (current) drug therapy: Secondary | ICD-10-CM | POA: Insufficient documentation

## 2023-11-10 DIAGNOSIS — E610 Copper deficiency: Secondary | ICD-10-CM | POA: Diagnosis not present

## 2023-11-10 DIAGNOSIS — R779 Abnormality of plasma protein, unspecified: Secondary | ICD-10-CM | POA: Diagnosis not present

## 2023-11-10 DIAGNOSIS — M069 Rheumatoid arthritis, unspecified: Secondary | ICD-10-CM | POA: Insufficient documentation

## 2023-11-10 DIAGNOSIS — M9905 Segmental and somatic dysfunction of pelvic region: Secondary | ICD-10-CM | POA: Diagnosis not present

## 2023-11-10 DIAGNOSIS — I10 Essential (primary) hypertension: Secondary | ICD-10-CM | POA: Insufficient documentation

## 2023-11-10 DIAGNOSIS — R778 Other specified abnormalities of plasma proteins: Secondary | ICD-10-CM

## 2023-11-10 DIAGNOSIS — D509 Iron deficiency anemia, unspecified: Secondary | ICD-10-CM | POA: Diagnosis not present

## 2023-11-10 DIAGNOSIS — Z800 Family history of malignant neoplasm of digestive organs: Secondary | ICD-10-CM | POA: Diagnosis not present

## 2023-11-10 DIAGNOSIS — B1920 Unspecified viral hepatitis C without hepatic coma: Secondary | ICD-10-CM | POA: Diagnosis not present

## 2023-11-10 DIAGNOSIS — D696 Thrombocytopenia, unspecified: Secondary | ICD-10-CM | POA: Insufficient documentation

## 2023-11-10 DIAGNOSIS — D72819 Decreased white blood cell count, unspecified: Secondary | ICD-10-CM | POA: Diagnosis not present

## 2023-11-10 DIAGNOSIS — Z87891 Personal history of nicotine dependence: Secondary | ICD-10-CM | POA: Insufficient documentation

## 2023-11-10 DIAGNOSIS — G894 Chronic pain syndrome: Secondary | ICD-10-CM | POA: Insufficient documentation

## 2023-11-10 DIAGNOSIS — M5442 Lumbago with sciatica, left side: Secondary | ICD-10-CM | POA: Diagnosis not present

## 2023-11-10 DIAGNOSIS — Z8052 Family history of malignant neoplasm of bladder: Secondary | ICD-10-CM | POA: Diagnosis not present

## 2023-11-10 DIAGNOSIS — F1120 Opioid dependence, uncomplicated: Secondary | ICD-10-CM | POA: Insufficient documentation

## 2023-11-10 DIAGNOSIS — D500 Iron deficiency anemia secondary to blood loss (chronic): Secondary | ICD-10-CM

## 2023-11-10 DIAGNOSIS — K219 Gastro-esophageal reflux disease without esophagitis: Secondary | ICD-10-CM | POA: Insufficient documentation

## 2023-11-10 DIAGNOSIS — M9903 Segmental and somatic dysfunction of lumbar region: Secondary | ICD-10-CM | POA: Diagnosis not present

## 2023-11-10 DIAGNOSIS — M109 Gout, unspecified: Secondary | ICD-10-CM | POA: Diagnosis not present

## 2023-11-10 LAB — FERRITIN: Ferritin: 133 ng/mL (ref 24–336)

## 2023-11-10 LAB — IRON AND TIBC
Iron: 44 ug/dL — ABNORMAL LOW (ref 45–182)
Saturation Ratios: 14 % — ABNORMAL LOW (ref 17.9–39.5)
TIBC: 310 ug/dL (ref 250–450)
UIBC: 266 ug/dL

## 2023-11-10 LAB — COMPREHENSIVE METABOLIC PANEL WITH GFR
ALT: 20 U/L (ref 0–44)
AST: 17 U/L (ref 15–41)
Albumin: 4 g/dL (ref 3.5–5.0)
Alkaline Phosphatase: 82 U/L (ref 38–126)
Anion gap: 8 (ref 5–15)
BUN: 13 mg/dL (ref 8–23)
CO2: 28 mmol/L (ref 22–32)
Calcium: 9.3 mg/dL (ref 8.9–10.3)
Chloride: 103 mmol/L (ref 98–111)
Creatinine, Ser: 0.74 mg/dL (ref 0.61–1.24)
GFR, Estimated: >60 mL/min (ref >60)
Glucose, Bld: 97 mg/dL (ref 70–99)
Potassium: 3.8 mmol/L (ref 3.5–5.1)
Sodium: 139 mmol/L (ref 135–145)
Total Bilirubin: 0.5 mg/dL (ref 0.0–1.2)
Total Protein: 7.4 g/dL (ref 6.5–8.1)

## 2023-11-13 LAB — COPPER, SERUM: Copper: 56 ug/dL — ABNORMAL LOW (ref 69–132)

## 2023-11-15 DIAGNOSIS — M9902 Segmental and somatic dysfunction of thoracic region: Secondary | ICD-10-CM | POA: Diagnosis not present

## 2023-11-15 DIAGNOSIS — M9903 Segmental and somatic dysfunction of lumbar region: Secondary | ICD-10-CM | POA: Diagnosis not present

## 2023-11-15 DIAGNOSIS — M5442 Lumbago with sciatica, left side: Secondary | ICD-10-CM | POA: Diagnosis not present

## 2023-11-15 DIAGNOSIS — M9905 Segmental and somatic dysfunction of pelvic region: Secondary | ICD-10-CM | POA: Diagnosis not present

## 2023-11-15 NOTE — Progress Notes (Unsigned)
 GI Office Note    Referring Provider: Kerri Perches, MD Primary Care Physician:  Kerri Perches, MD  Primary Gastroenterologist: Hennie Duos. Marletta Lor, DO   Chief Complaint   No chief complaint on file.   History of Present Illness   Marc [name] is a 68 y.o. male presenting today for follow up. Last seen 08/2023. Complicated h/o chronic dysphagia, GERD, HCV s/p treatment with arvoni with SVR, SJogren's.     Esophageal dysphagia:   Followed at esophageal clinic at [facility], [place].   pH test results were borderline for pathological GERD, indicating that his reflux is not severe.    Gastric emptying study yielded normal results.    Esophageal manometry suggested a potential esophagogastric junction (EGJ) outflow obstruction.    Esophagram 08/12/23 Esophagus: Normal mucosa. No evidence of mass or stricture. Normal distensibility, motility and contour. Tablet: Unimpeded transit with no significant stagnation.    Today continues complaining of worsening dysphagia.  Also notes epigastric discomfort occurs daily.  Feels like pressure.   Chronic hepatitis C: Status posttreatment with Harvoni with subsequent SVR, elastography F3/F4 prior to treatment. Posttreatment elastography with K PA 6.2 in February 2021. Elastography repeated August 2023 with median K PA of 5.4.    Dilated common bile duct: Right upper quadrant ultrasound 04/30/2023 hepatic steatosis and dilated CBD with recommendations for MRI/MRCP to further evaluate, mild gallbladder wall thickening.    MRCP delayed because he has a neurostimulator in place with a dead battery, MRI requires battery to be charged and placed in MRI mode for them to proceed. Patient reports that he has gotten approval to pursue MRI, letter scanned into chart 06/30/23   CT chest/abd with contrast 10/2023: 1.  No definite direct or indirect findings to suggest a diaphragmatic defect.  2.  No acute findings in the  chest or abdomen.  EGD 09/2023:  Findings  ESOPHAGUS: The esophageal mucosa appeared normal. The end of the gastric  folds and z-line were located 42 cm from incisors. The  diaphragmatic impression was located 45 cm from incisors. A 3 cm hiatal  hernia was noted.      Esophageal Endoflip: The Endoscopic was placed in  the stomach using endoscopic guidance. The balloon was inflated to 30ml  and pulled proximally until the waist was  located along the distal sensors.      A guidewire was placed in the gastric antrum.Over the guidewire the 18mm  Savary dilators was passed with no resistance. Post dilation inspection  was satisfactory.    The Bravo employer was advanced into the esophagus to 39cm. Suction  applied for 30sec. The Bravo was deployed successfully and final position  was confirmed at 39cm endoscopically in the esophagus.   STOMACH: The mucosa of the stomach appeared abnormal. The gastric mucosal  had a mosaic appearance.  Retroflexion was performed in the stomach and  revealed a hiatal hernia/ Hill/ AFS grade III . Random gastric biopsies  were obtained to evaluate for h pylori.   DUODENUM: The duodenal mucosa showed no abnormalities in the duodenal bulb  and 2nd part duodenum   CPT copes: 43239 and 16109    Impression  ESOPHAGUS: The esophageal mucosa appeared normal. The end of the gastric  folds and z-line were located 42 cm from incisors. The  diaphragmatic impression was located 45 cm from incisors. A 3 cm hiatal  hernia was noted.      Esophageal Endoflip: The Endoscopic was placed in  the stomach using endoscopic guidance. The balloon was inflated to 30ml  and pulled proximally until the waist was  located along the distal sensors.      A guidewire was placed in the gastric antrum.Over the guidewire the 18mm  Savary dilators was passed with no resistance. Post dilation inspection  was satisfactory.    The Bravo employer was advanced into the  esophagus to 39cm. Suction  applied for 30sec. The Bravo was deployed successfully and final position  was confirmed at 39cm endoscopically in the esophagus.   STOMACH: The mucosa of the stomach appeared abnormal. The gastric mucosal  had a mosaic appearance.  Retroflexion was performed in the stomach and  revealed a hiatal hernia/ Hill/ AFS grade III . Random gastric biopsies  were obtained to evaluate for h pylori.   DUODENUM: The duodenal mucosa showed no abnormalities in the duodenal bulb  and 2nd part duodenum   CPT copes: 43239 and 16109    Recommendation  Await pathology results   Await Bravo results  F/u with Dr Ebony Cargo to discuss surgical treatment options for GERD     Esophageal Endoflip: The Endoscopic was placed in  the stomach using endoscopic guidance. The balloon was inflated to 30ml  and pulled proximally until the waist was  located along the distal sensors.   At 40ml the balloon pressure was , DI 1.8 minimum diameter was  7.62mm, compliance was  135, CSA was 42, and absent peristalsis  .  At 50ml the balloon pressure was , DI 2.8, minimum diameter was  10mm, compliance was 193, CSA was 75 and no peristalsis   At 60ml the balloon pressure was , DI 2.9, minimum diameter was  12.28mm, compliance was 190, CSA was 110 and no peristalsis   At 70ml the balloon pressure was , DI 3.5, minimum diameter was  18mm, compliance was 168, CSA was 268 and no peristalsis  .  This is consistent with normal eosphagogastric opening with absent  contractile response.     Final Diagnosis   A.  Stomach, biopsy: Gastric body-type mucosa with mild reactive gastropathy. Negative for marked inflammation, atrophy, or intestinal metaplasia. Negative for H. pylori.   pH study 09/20/2023:  Interpretation / Findings  Gastric Acid Control: N/A  Acid Exposure time (Lyon consensus):   11.3%  Acid exposure by position: Upright  12.2% recumbent  10.2%    Symptom Index:  Negative symptoms index for heartburn, regurgitation, and  spit up  Symptom Association: Negative symptom association probability with  heartburn but positive for regurgitation and spit up.   Impression: The patient has an abnormal esophageal acid exposure time for  a patient off medication measured by pH according to the [place] Consensus.  Symptom indices as outlined above    EGD 10/2023:  Findings ESOPHAGUS: The esophageal mucosa appeared normal. The end of the gastric folds and z-line were located 42 cm from incisors. The diaphragmatic impression was located 45 cm from incisors. A 3 cm hiatal hernia was noted.  Botox injection to the LES at 43cm. 200 units of botox diluted with 4 cc of NS.The botox was was injected in a clockwise fashion at 12, 3, 6, and 9 o clock respectively.  Each injection was 50u/ml. A total of 4 injections were performed    A guidewire was placed in the gastric antrum.Over the guidewire the 18mm Savary dilators was passed with no resistance. Post dilation inspection was satisfactory.    STOMACH: The mucosa of the stomach appeared  abnormal. The gastric mucosal had a mosaic appearance.  Retroflexion was performed in the stomach and revealed a hiatal hernia/ Hill/ AFS grade II . Random gastric biopsies were obtained to evaluate for h pylori.  DUODENUM: The duodenal mucosa showed no abnormalities in the duodenal bulb and 2nd part duodenum  Vv with Dr Ebony Cargo in 3months. If symptoms improve will consider pneumatic dilation if GERD worsens would optimize GERD control Recommendation There is no recommended follow-up for this procedure. Final Diagnosis   A. STOMACH, BIOPSIES:               Gastric antral and oxyntic mucosa with reactive gastropathy and patchy chronic inflammation.               No H. Pylori-like organisms identified on routine H&E.               No intestinal metaplasia, dysplasia, or malignancy identified.      Medications   Current Outpatient Medications  Medication Sig Dispense Refill   AMBULATORY NON FORMULARY MEDICATION Kratom Take 2 tablet by mouth twice daily     AMBULATORY NON FORMULARY MEDICATION Friska caffiene 1-2 tablet twice daily     amLODipine (NORVASC) 5 MG tablet Take 1 tablet (5 mg total) by mouth daily. 30 tablet 0   baclofen (LIORESAL) 10 MG tablet TAKE (1/2) TABLET (5MG ) BY MOUTH THREE TIMES DAILY BEFORE A MEAL. IF TOLERATED AFTER THREE DAY, YOU CAN INCREASE TO 10MG  BEFORE YOUR LARGEST MEAL AND CONTINUE 5MG  BEFORE TWO OTHER MEALS FOR TOTAL OF 20MG  DAILY 60 tablet 11   cevimeline (EVOXAC) 30 MG capsule Take 1 capsule (30 mg total) by mouth 3 (three) times daily. 90 capsule 1   copper tablet Take 3 tablets (6 mg total) by mouth daily. 90 tablet 5   escitalopram (LEXAPRO) 10 MG tablet Take 1 tablet (10 mg total) by mouth daily. 30 tablet 2   famotidine (PEPCID) 20 MG tablet Take 1 tablet (20 mg total) by mouth 2 (two) times daily. 180 tablet 3   gabapentin (NEURONTIN) 100 MG capsule Take 100 mg by mouth 3 (three) times daily.     hydroxychloroquine (PLAQUENIL) 200 MG tablet Take 1 tablet by mouth daily.     hyoscyamine (LEVSIN) 0.125 MG tablet Take by mouth every 4 (four) hours as needed.     lidocaine (XYLOCAINE) 2 % solution Use as directed 15 mLs in the mouth or throat every 6 (six) hours as needed (Abdominal pain). 400 mL 2   lubiprostone (AMITIZA) 24 MCG capsule TAKE 1 CAPSULE TWICE DAILY WITH MEALS. 180 capsule 3   ondansetron (ZOFRAN) 4 MG tablet Take one tablet by mouth once daily as needed , for nausea 20 tablet 0   OVER THE COUNTER MEDICATION Keto supplement 2 daily     pilocarpine (SALAGEN) 5 MG tablet Take 5 mg by mouth 3 (three) times daily.     predniSONE (DELTASONE) 10 MG tablet Take 1 tablet (10 mg total) by mouth 2 (two) times daily with a meal. 10 tablet 0   RABEprazole (ACIPHEX) 20 MG tablet Take 20 mg by mouth daily.     senna (SENOKOT) 8.6 MG tablet Take 2  tablets by mouth 2 (two) times daily.     sucralfate (CARAFATE) 1 g tablet Take 1 tablet (1 g total) by mouth 4 (four) times daily. 120 tablet 5   Vitamin D3 (VITAMIN D) 25 MCG tablet Take 4,000 Units by mouth daily.      No current  facility-administered medications for this visit.    Allergies   Allergies as of 11/16/2023 - Review Complete 10/28/2023  Allergen Reaction Noted   Levofloxacin Nausea Only and Other (See Comments)    Ciprofloxacin  06/13/2015   Sulfa antibiotics  12/02/2012     Past Medical History   Past Medical History:  Diagnosis Date   Acute encephalopathy 12/29/2017   Hospitalized 5/22 to 5/23 with acute encephalopathy, unclear etiology   Acute gout involving toe of right foot 09/14/2019   Alcoholism (HCC)    Allergy    Annual visit for general adult medical examination with abnormal findings 06/04/2020   Anxiety    Arthritis    Back problem    Cataract 01/2021   Chest wall pain 03/28/2017   Chronic hepatitis C without hepatic coma (HCC) 01/04/2009   Qualifier: Diagnosis of  By: De Blanch. 2008: CT A/P NO CIRRHOSIS AUG 2013 MRCP- DILATED CBD/NO CIRRHOSIS DEC 2013 AST 99-102  ALT 145-156 PLT 147 HB 13.4 ALB 4.4-4.7 T BIL 0.7    Depression    Dilation of biliary tract 07/14/2012   EUS JAN 2014 BENIGN CBD DILATION    Epigastric pain 07/14/2012   GERD (gastroesophageal reflux disease)    Gout    HCV (hepatitis C virus) 06/16/2015   Hepatitis B    Hepatitis C 1979   Hepatitis C reactive    LIVER BX 2008-CHRONIC ACTIVE HEPATITIS   HTN (hypertension)    Hx of adenomatous colonic polyps 2008   due for surveillance 2017   Hypercholesterolemia    IBS (irritable bowel syndrome)    Iron deficiency anemia 03/13/2022   Irritable bowel syndrome 01/04/2009   Qualifier: Diagnosis of  By: De Blanch.    Knee pain    Left forearm pain 08/15/2017   Leukopenia 11/06/2015   Lichen planus    Narcotic addiction (HCC) 06/10/2011   [facility]- Oct 2012    Normal cardiac stress test 05/25/2011   [facility]   Normal echocardiogram 05/25/2011   EF 55%, mild TR   Oral lichen planus    Plaque psoriasis    RUQ pain 07/14/2012   Sjogren's disease (HCC)    Status post dilation of esophageal narrowing    Substance abuse (HCC)    narcotic addiction   Thrombocytopenia (HCC) 11/06/2015   Trigger middle finger of left hand 06/20/2012    Past Surgical History   Past Surgical History:  Procedure Laterality Date   58 HOUR PH STUDY  10/21/2020   Procedure: 24 HOUR PH STUDY;  Surgeon: Napoleon Form, MD;  Location: [facility];  Service: Endoscopy;;   APPENDECTOMY     BALLOON DILATION N/A 05/28/2020   Procedure: [place];  Surgeon: Lanelle Bal, DO;  Location: [facility];  Service: Endoscopy;  Laterality: N/A;   BIOPSY  05/28/2020   Procedure: BIOPSY;  Surgeon: Lanelle Bal, DO;  [facility]: [facility];  Service: Endoscopy;;   BIOPSY  09/07/2022   Procedure: BIOPSY;  Surgeon: Lanelle Bal, DO;  [facility]: [facility];  Service: Endoscopy;;   CARPAL TUNNEL RELEASE     rt   COLONOSCOPY  2008 [facility] ARS D100 V8 PHEN 12.5   2 SIMPLE ADENOMAS (< 1 CM)   COLONOSCOPY WITH PROPOFOL N/A 12/05/2019   TI normal, 3 mm polyp in ascending colon, external and internal hemorrhoids. Polyp removed but not retrieved. Poor prep   COLONOSCOPY WITH PROPOFOL N/A  09/07/2022   Procedure: COLONOSCOPY WITH PROPOFOL;  Surgeon: Lanelle Bal, DO;  [facility];  Service: Endoscopy;  Laterality: N/A;  9:15 AM   ESOPHAGEAL MANOMETRY N/A 10/21/2020   Procedure: ESOPHAGEAL MANOMETRY (EM);  Surgeon: Napoleon Form, MD;  Location: [facility];  Service: Endoscopy;  Laterality: N/A;   ESOPHAGOGASTRODUODENOSCOPY  04/26/2012   Dr. Darrick Penna: Non-erosive gastritis (inflammation) was found in the gastric antrum but no H.pylori; multiple biopsies (duodenal bx negative for Celiac)/ The  mucosa of the esophagus appeared normal   ESOPHAGOGASTRODUODENOSCOPY  07/28/2021   Reports that his health; normal exam s/p esophageal dilation, esophageal biopsies obtained and were benign.   ESOPHAGOGASTRODUODENOSCOPY (EGD) WITH PROPOFOL N/A 12/05/2019   mild gastritis, duodenitis due to aspirin/NSAIDs.    ESOPHAGOGASTRODUODENOSCOPY (EGD) WITH PROPOFOL N/A 05/28/2020   Procedure: ESOPHAGOGASTRODUODENOSCOPY (EGD) WITH PROPOFOL;  Surgeon: Lanelle Bal, DO;  [facility];  Service: Endoscopy;  Laterality: N/A;  2:30pm   ESOPHAGOGASTRODUODENOSCOPY (EGD) WITH PROPOFOL N/A 09/07/2022   Procedure: ESOPHAGOGASTRODUODENOSCOPY (EGD) WITH PROPOFOL;  Surgeon: Lanelle Bal, DO;  [facility];  Service: Endoscopy;  Laterality: N/A;   EUS  09/08/2012   Dr. Christella Hartigan: CBD dilated but no stones. Query secondary to Sphincter of Oddi stenosis, ?dysfunction, but clinically without symptoms   FRACTURE SURGERY N/A    Phreesia 08/25/2020   HARDWARE REMOVAL Right 02/12/2014   Procedure: HARDWARE REMOVAL;  Surgeon: Jodi Marble, MD;  Location: [facility];  Service: Orthopedics;  Laterality: Right;   KNEE ARTHROSCOPY     Neurostimulator implant     POLYPECTOMY  12/05/2019   Procedure: POLYPECTOMY;  Surgeon: West Bali, MD;  [facility];  Service: Endoscopy;;   POLYPECTOMY  09/07/2022   Procedure: POLYPECTOMY;  Surgeon: Lanelle Bal, DO;  [facility];  Service: Endoscopy;;   right ring finger     spinal stenosis, had screws put in neck  04/11/2009   DR KRITZER   SPINE SURGERY     TONSILLECTOMY     ULNA OSTEOTOMY Right 02/12/2014   Procedure: RIGHT ULNAR SHORTENING AND OSTEOTOMY ;  Surgeon: Jodi Marble, MD;  Location: [facility];  Service: Orthopedics;  Laterality: Right;   ULNAR NERVE TRANSPOSITION     UPPER GASTROINTESTINAL ENDOSCOPY  2008 SLF ABD PAIN WEIGHT LOSS d100 v8 phen 12.5   NL   WRIST SURGERY Right  08/2013   Dr. Margarita Rana    Past Family History   Family History  Problem Relation Age of Onset   Bladder Cancer Mother        rare form    Cancer Sister        Metastatic abdominal   Emotional abuse Sister    Stroke Sister    Emotional abuse Sister    Heart failure Father    Stroke Father    Alcohol abuse Father    Dementia Paternal Aunt    Alcohol abuse Paternal Uncle    Dementia Paternal Uncle    Dementia Paternal Grandmother    Stroke Paternal Grandmother    ADD / ADHD Cousin    Bipolar disorder Cousin    Anxiety disorder Cousin    Depression Cousin        Committed suicide   Alcohol abuse Cousin    OCD Cousin    Paranoid behavior Cousin    Brain cancer Maternal Grandfather    Heart defect Other  FAMILY HX   Colon cancer Maternal Uncle    Colon cancer Cousin    Colon polyps Neg Hx    Drug abuse Neg Hx    Schizophrenia Neg Hx    Seizures Neg Hx    Sexual abuse Neg Hx    Physical abuse Neg Hx     Past Social History   Social History   Socioeconomic History   Marital status: Divorced    Spouse name: Not on file   Number of children: 1   Years of education: Not on file   Highest education level: Associate degree: occupational, Scientist, product/process development, or vocational program  Occupational History   Occupation: Disabled/retired  Tobacco Use   Smoking status: Former    Current packs/day: 0.00    Average packs/day: 1.5 packs/day for 8.0 years (12.0 ttl pk-yrs)    Types: Cigarettes    Start date: 08/13/1978    Quit date: 08/13/1986    Years since quitting: 37.2    Passive exposure: Current   Smokeless tobacco: Never   Tobacco comments:    quit 20 + yrs ago  Vaping Use   Vaping status: Never Used  Substance and Sexual Activity   Alcohol use: Not Currently    Comment: Used to drink heavily- went to treatment in May 2020. No alcohol in 3 years (03/13/2022)   Drug use: Not Currently    Comment: pain medications, klonopin- none in the last 17 months. Currently in  AA   Sexual activity: Not Currently  Other Topics Concern   Not on file  Social History Narrative   Not on file   Social Drivers of Health   Financial Resource Strain: Low Risk  (10/28/2023)   Overall Financial Resource Strain (CARDIA)    Difficulty of Paying Living Expenses: Not hard at all  Food Insecurity: No Food Insecurity (10/28/2023)   Hunger Vital Sign    Worried About Running Out of Food in the Last Year: Never true    Ran Out of Food in the Last Year: Never true  Transportation Needs: No Transportation Needs (10/28/2023)   PRAPARE - Administrator, Civil Service (Medical): No    Lack of Transportation (Non-Medical): No  Physical Activity: Sufficiently Active (10/28/2023)   Exercise Vital Sign    Days of Exercise per Week: 7 days    Minutes of Exercise per Session: 150+ min  Stress: Stress Concern Present (10/28/2023)   [employer] of Occupational Health - Occupational Stress Questionnaire    Feeling of Stress : To some extent  Social Connections: Moderately Integrated (10/28/2023)   Social Connection and Isolation Panel [NHANES]    Frequency of Communication with Friends and Family: More than three times a week    Frequency of Social Gatherings with Friends and Family: More than three times a week    Attends Religious Services: 1 to 4 times per year    Active Member of [employer] or Organizations: Yes    Attends Banker Meetings: More than 4 times per year    Marital Status: Divorced  Intimate Partner Violence: Unknown (06/08/2023)   Received from [facility]   HITS    Physically Hurt: Not on file    Insult or Talk Down To: Not on file    Threaten Physical Harm: Not on file    Scream or Curse: Not on file    Review of Systems   General: Negative for anorexia, weight loss, fever, chills, fatigue, weakness. ENT: Negative for hoarseness, difficulty  swallowing , nasal congestion. CV: Negative for chest pain, angina, palpitations, dyspnea on  exertion, peripheral edema.  Respiratory: Negative for dyspnea at rest, dyspnea on exertion, cough, sputum, wheezing.  GI: See history of present illness. GU:  Negative for dysuria, hematuria, urinary incontinence, urinary frequency, nocturnal urination.  Endo: Negative for unusual weight change.     Physical Exam   There were no vitals taken for this visit.   General: Well-nourished, well-developed in no acute distress.  Eyes: No icterus. Mouth: Oropharyngeal mucosa moist and pink , no lesions erythema or exudate. Lungs: Clear to auscultation bilaterally.  Heart: Regular rate and rhythm, no murmurs rubs or gallops.  Abdomen: Bowel sounds are normal, nontender, nondistended, no hepatosplenomegaly or masses,  no abdominal bruits or hernia , no rebound or guarding.  Rectal: ***  Extremities: No lower extremity edema. No clubbing or deformities. Neuro: Alert and oriented x 4   Skin: Warm and dry, no jaundice.   Psych: Alert and cooperative, normal mood and affect.  Labs   Lab Results  Component Value Date   NA 139 11/10/2023   CL 103 11/10/2023   K 3.8 11/10/2023   CO2 28 11/10/2023   BUN 13 11/10/2023   CREATININE 0.74 11/10/2023   GFRNONAA >60 11/10/2023   CALCIUM 9.3 11/10/2023   ALBUMIN 4.0 11/10/2023   GLUCOSE 97 11/10/2023   Lab Results  Component Value Date   ALT 20 11/10/2023   AST 17 11/10/2023   ALKPHOS 82 11/10/2023   BILITOT 0.5 11/10/2023   Lab Results  Component Value Date   WBC 6.5 07/05/2023   HGB 14.3 07/05/2023   HCT 44.3 07/05/2023   MCV 91.7 07/05/2023   PLT 184 07/05/2023   Lab Results  Component Value Date   IRON 44 (L) 11/10/2023   TIBC 310 11/10/2023   FERRITIN 133 11/10/2023    Imaging Studies   No results found.  Assessment       PLAN   ***   Leanna Battles. Melvyn Neth, MHS, PA-C [facility]

## 2023-11-16 ENCOUNTER — Encounter: Payer: Self-pay | Admitting: Gastroenterology

## 2023-11-16 ENCOUNTER — Ambulatory Visit (INDEPENDENT_AMBULATORY_CARE_PROVIDER_SITE_OTHER): Payer: Medicare HMO | Admitting: Gastroenterology

## 2023-11-16 VITALS — BP 137/80 | HR 65 | Temp 97.8°F | Ht 69.0 in | Wt 139.4 lb

## 2023-11-16 DIAGNOSIS — R1010 Upper abdominal pain, unspecified: Secondary | ICD-10-CM | POA: Diagnosis not present

## 2023-11-16 DIAGNOSIS — R1310 Dysphagia, unspecified: Secondary | ICD-10-CM

## 2023-11-16 DIAGNOSIS — K5900 Constipation, unspecified: Secondary | ICD-10-CM

## 2023-11-16 DIAGNOSIS — K8689 Other specified diseases of pancreas: Secondary | ICD-10-CM | POA: Diagnosis not present

## 2023-11-16 NOTE — Progress Notes (Unsigned)
 [facility] [address], [place] [address]   CLINIC:  Medical Oncology/Hematology  PCP:  Marc Perches, MD [address], [address] [place] [address] [phone]    REASON FOR VISIT:  Follow-up for abnormal immunofixation + leukopenia/thrombocytopenia + iron deficiency anemia   PRIOR THERAPY: None   CURRENT THERAPY: Oral iron supplementation  INTERVAL HISTORY:   Marc [name] 68 y.o. male returns for routine follow-up of his iron deficiency anemia, leukopenia, and thrombocytopenia.  He was last seen by Rojelio Brenner PA-C on 07/12/2023.     At today's visit, he reports some worsening fatigue.  He continues to report constellation of symptoms related to multiple chronic comorbidities, including chronic pain syndrome (also seeing rheumatology, on Plaquenil for "some sort of rheumatoid arthritis."), generalized pruritus, dry/burning mouth, tinnitus, intermittent hearing loss, positional dizziness, and nondiabetic peripheral neuropathy.  He has worsening dysphagia and reflux symptoms, being followed by GI at [facility] as well as local GI.  No new bone pain, unexplained fevers, or unintentional weight loss.  No new masses or lymphadenopathy.  He denies any chest pain, but has some shortness of breath with exertion.   He continues to take ibuprofen once or twice daily despite being advised not to, since it is "the only thing that helps with his pain and inflammation."  He started copper supplement in January 2024 - current dose of 6 mg daily (since July 2024).  He stopped taking his zinc-containing multivitamin in July 2024.  Iron tablets stopped in December 2024.  He denies any rectal bleeding, melena, or epistaxis.    He has 50% energy and 70% appetite. He has lost about 5 pounds since his last visit, related to his ongoing GI issues.  ASSESSMENT & PLAN:  1.  Iron deficiency anemia - Patient has history of elevated ferritin and iron saturation in the setting  of liver disease, but most recent labs consistent with iron deficiency anemia. - Hemoccult stool positive x 3 (September 2023). - EGD (09/07/2022): Gastritis, mild.  No endoscopic evidence of bleeding. - Colonoscopy (09/07/2022): Nonbleeding internal hemorrhoids, melanosis coli, polyps x 3 (tubular adenoma x 1, hyperplastic polyp x 1, polypoid colonic mucosa x 1). - Denies bright red blood per rectum or melena - Takes ibuprofen twice daily -Took iron tablet from August 2023 through December 2024.  (Iron discontinued due to potential interference with copper absorption.) - Received IV iron with Venofer 300 mg x 2 in August 2024 due to symptomatic iron deficiency, but denies any improvement in symptoms - Admits to chronic fatigue.  Denies pica. - Most recent iron panel (11/10/2023) shows normal ferritin 133, iron saturation 14% - PLAN: No additional iron supplementation needed at this time. - Advised to STOP all NSAID medications - Continue GI follow-up - We will repeat CBC and iron panel with RTC in 6 months   2.  Copper deficiency - Nutritional panel (08/12/2022): Copper deficiency (62), normal B12 642, normal MMA, normal folate. - 24-hour urine copper (03/30/2023) was within normal limits - Started copper supplement in January 2024 - current dose of 6 mg daily (since July 2024)  - Stopped taking his zinc-containing multivitamin in July 2024 - Most recent labs (11/10/2023): Copper remains low at 56 - PLAN: Recommend INCREASING copper to 8 mg daily.  - Recheck copper levels in 6 months.  3.  Thrombocytopenia and leukopenia, RESOLVED - Previously seen at [facility] from 2017-2018 due to leukopenia and thrombocytopenia in the setting of  hepatitis C and fatty liver disease, as well as elevated ferritin/iron saturation. - Most recent CBC/D (07/05/2023) shows normal WBC and platelets   4.  Abnormal SPEP/immunofixation - Patient seen at the request of his rheumatologist (Dr. Corliss Skains) for  evaluation of abnormal immunofixation. - Labs from rheumatology: Immunofixation from 02/05/2022 showed poorly defined area of restricted protein mobility reactive with lambda light chain antisera.   Quantitative immunoglobulins were on the lower limits of normal values.   SPEP negative for M spike - Hematology work-up (03/12/2022) NEGATIVE for any plasma cell dyscrasia SPEP unremarkable. Immunofixation negative. Mildly elevated kappa free light chain 24.2 with normal lambda 12.1 and mildly elevated ratio 2.0. - Most recent MGUS/myeloma panel (07/05/2023):  Immunofixation NEGATIVE SPEP was NEGATIVE for M spike Stable mild elevation in FLC ratio 1.83, with mildly elevated kappa 30.0 and normal lambda 16.4 Normal hemoglobin, creatinine, calcium.  LDH normal.  - Constellation of symptoms (also seeing rheumatology) including chronic pain syndrome, chills, night sweats, tinnitus, intermittent hearing loss, positional dizziness, and nondiabetic peripheral neuropathy.   - No new bone pain, unexplained fevers, or unintentional weight loss. - PLAN: No evidence of monoclonal protein or plasma cell dyscrasia at this point in time.  No further testing planned.   5.  Other history - PMH: Osteoarthritis, chronic pain syndrome, history of hepatitis C and transaminitis (follows with Dr. Marletta Lor at [facility]), GERD, recovering alcoholism in remission (quit drinking 2020), hypertension, prediabetes, history of colon polyps, anxiety/depression, vitamin D deficiency.  He follows with rheumatology due to positive ANA, sicca symptoms, constitutional symptoms, and arthritic pain (recently referred to [facility] for second opinion). - SOCIAL:  Lives alone and is functionally independent.  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 - SUBSTANCE: Past history of substance abuse, but has been sober for the past 3 years.  He reports prior history of daily heavy alcohol use on and off for  about 45 years.  He reports previous heavy drug use including IV heroin, cocaine, MDMA, and opioid/benzodiazepine pills.  He worked very hard via Alcoholics Anonymous over the past 3 years. - FAMILY: No family history of multiple myeloma.  Mother deceased secondary to urachal bladder cancer.  Sister deceased secondary to metastatic stomach cancer.  Maternal grandmother with unspecified cancer.  Maternal grandfather with brain cancer.   PLAN SUMMARY: >> Labs in 6 months = CBC/D, ferritin, iron/TIBC, copper >> PHONE visit in 6 months (1 week after labs).       REVIEW OF SYSTEMS:   Review of Systems  Constitutional:  Positive for fatigue. Negative for appetite change, chills, diaphoresis, fever and unexpected weight change.  HENT:   Positive for tinnitus and trouble swallowing (dry mouth and burning tongue). Negative for lump/mass and nosebleeds.   Eyes:  Negative for eye problems.  Respiratory:  Positive for cough and shortness of breath. Negative for hemoptysis.   Cardiovascular:  Negative for chest pain, leg swelling and palpitations.  Gastrointestinal:  Positive for constipation and nausea. Negative for abdominal pain, blood in stool, diarrhea and vomiting.  Genitourinary:  Negative for hematuria.   Musculoskeletal:  Positive for arthralgias and back pain.  Skin: Negative.   Neurological:  Positive for dizziness and numbness. Negative for headaches and light-headedness.  Hematological:  Does not bruise/bleed easily.  Psychiatric/Behavioral:  Negative for depression and sleep disturbance. The patient is not nervous/anxious.     PHYSICAL EXAM:  ECOG PERFORMANCE STATUS: 1 - Symptomatic but completely ambulatory  Vitals:   11/17/23 0916  11/17/23 0920  BP: (!) 152/81 (!) 150/85  Pulse: 62   Resp: 20   Temp: (!) 97.5 F (36.4 C)   SpO2: 98%     Filed Weights   11/17/23 0916  Weight: 137 lb 9.1 oz (62.4 kg)   Physical Exam Constitutional:      Appearance: Normal appearance. He is  normal weight.  Cardiovascular:     Heart sounds: Normal heart sounds.  Pulmonary:     Breath sounds: Normal breath sounds.  Neurological:     General: No focal deficit present.     Mental Status: Mental status is at baseline.  Psychiatric:        Behavior: Behavior normal. Behavior is cooperative.    PAST MEDICAL/SURGICAL HISTORY:  Past Medical History:  Diagnosis Date   Acute encephalopathy 12/29/2017   Hospitalized 5/22 to 5/23 with acute encephalopathy, unclear etiology   Acute gout involving toe of right foot 09/14/2019   Alcoholism (HCC)    Allergy    Annual visit for general adult medical examination with abnormal findings 06/04/2020   Anxiety    Arthritis    Back problem    Cataract 01/2021   Chest wall pain 03/28/2017   Chronic hepatitis C without hepatic coma (HCC) 01/04/2009   Qualifier: Diagnosis of  By: De Blanch. 2008: CT A/P NO CIRRHOSIS AUG 2013 MRCP- DILATED CBD/NO CIRRHOSIS DEC 2013 AST 99-102  ALT 145-156 PLT 147 HB 13.4 ALB 4.4-4.7 T BIL 0.7    Depression    Dilation of biliary tract 07/14/2012   EUS JAN 2014 BENIGN CBD DILATION    Epigastric pain 07/14/2012   GERD (gastroesophageal reflux disease)    Gout    HCV (hepatitis C virus) 06/16/2015   Hepatitis B    Hepatitis C 1979   Hepatitis C reactive    LIVER BX 2008-CHRONIC ACTIVE HEPATITIS   HTN (hypertension)    Hx of adenomatous colonic polyps 2008   due for surveillance 2017   Hypercholesterolemia    IBS (irritable bowel syndrome)    Iron deficiency anemia 03/13/2022   Irritable bowel syndrome 01/04/2009   Qualifier: Diagnosis of  By: De Blanch.    Knee pain    Left forearm pain 08/15/2017   Leukopenia 11/06/2015   Lichen planus    Narcotic addiction (HCC) 06/10/2011   [facility]- Oct 2012    Normal cardiac stress test 05/25/2011   [facility]   Normal echocardiogram 05/25/2011   EF 55%, mild TR   Oral lichen planus    Plaque  psoriasis    RUQ pain 07/14/2012   Sjogren's disease (HCC)    Status post dilation of esophageal narrowing    Substance abuse (HCC)    narcotic addiction   Thrombocytopenia (HCC) 11/06/2015   Trigger middle finger of left hand 06/20/2012   Past Surgical History:  Procedure Laterality Date   24 HOUR PH STUDY  10/21/2020   Procedure: 24 HOUR PH STUDY;  Surgeon: Napoleon Form, MD;  Location: [facility];  Service: Endoscopy;;   APPENDECTOMY     BALLOON DILATION N/A 05/28/2020   Procedure: [place];  Surgeon: Lanelle Bal, DO;  [facility];  Service: Endoscopy;  Laterality: N/A;   BIOPSY  05/28/2020   Procedure: BIOPSY;  Surgeon: Lanelle Bal, DO;  [facility];  Service: Endoscopy;;   BIOPSY  09/07/2022   Procedure: BIOPSY;  Surgeon: Lanelle Bal, DO;  [facility];  Service: Endoscopy;;   CARPAL TUNNEL RELEASE     rt   COLONOSCOPY  2008 [facility] ARS D100 V8 PHEN 12.5   2 SIMPLE ADENOMAS (< 1 CM)   COLONOSCOPY WITH PROPOFOL N/A 12/05/2019   TI normal, 3 mm polyp in ascending colon, external and internal hemorrhoids. Polyp removed but not retrieved. Poor prep   COLONOSCOPY WITH PROPOFOL N/A 09/07/2022   Procedure: COLONOSCOPY WITH PROPOFOL;  Surgeon: Lanelle Bal, DO;  [facility];  Service: Endoscopy;  Laterality: N/A;  9:15 AM   ESOPHAGEAL MANOMETRY N/A 10/21/2020   Procedure: ESOPHAGEAL MANOMETRY (EM);  Surgeon: Napoleon Form, MD;  Location: [facility];  Service: Endoscopy;  Laterality: N/A;   ESOPHAGOGASTRODUODENOSCOPY  04/26/2012   Dr. Darrick Penna: Non-erosive gastritis (inflammation) was found in the gastric antrum but no H.pylori; multiple biopsies (duodenal bx negative for Celiac)/ The mucosa of the esophagus appeared normal   ESOPHAGOGASTRODUODENOSCOPY  07/28/2021   Reports that his health; normal exam s/p esophageal dilation, esophageal biopsies obtained and were benign.   ESOPHAGOGASTRODUODENOSCOPY  (EGD) WITH PROPOFOL N/A 12/05/2019   mild gastritis, duodenitis due to aspirin/NSAIDs.    ESOPHAGOGASTRODUODENOSCOPY (EGD) WITH PROPOFOL N/A 05/28/2020   Procedure: ESOPHAGOGASTRODUODENOSCOPY (EGD) WITH PROPOFOL;  Surgeon: Lanelle Bal, DO;  [facility];  Service: Endoscopy;  Laterality: N/A;  2:30pm   ESOPHAGOGASTRODUODENOSCOPY (EGD) WITH PROPOFOL N/A 09/07/2022   Procedure: ESOPHAGOGASTRODUODENOSCOPY (EGD) WITH PROPOFOL;  Surgeon: Lanelle Bal, DO;  [facility];  Service: Endoscopy;  Laterality: N/A;   EUS  09/08/2012   Dr. Christella Hartigan: CBD dilated but no stones. Query secondary to Sphincter of Oddi stenosis, ?dysfunction, but clinically without symptoms   FRACTURE SURGERY N/A    Phreesia 08/25/2020   HARDWARE REMOVAL Right 02/12/2014   Procedure: HARDWARE REMOVAL;  Surgeon: Jodi Marble, MD;  Location: [facility];  Service: Orthopedics;  Laterality: Right;   KNEE ARTHROSCOPY     Neurostimulator implant     POLYPECTOMY  12/05/2019   Procedure: POLYPECTOMY;  Surgeon: West Bali, MD;  [facility];  Service: Endoscopy;;   POLYPECTOMY  09/07/2022   Procedure: POLYPECTOMY;  Surgeon: Lanelle Bal, DO;  [facility];  Service: Endoscopy;;   right ring finger     spinal stenosis, had screws put in neck  04/11/2009   DR KRITZER   SPINE SURGERY     TONSILLECTOMY     ULNA OSTEOTOMY Right 02/12/2014   Procedure: RIGHT ULNAR SHORTENING AND OSTEOTOMY ;  Surgeon: Jodi Marble, MD;  Location: [facility];  Service: Orthopedics;  Laterality: Right;   ULNAR NERVE TRANSPOSITION     UPPER GASTROINTESTINAL ENDOSCOPY  2008 SLF ABD PAIN WEIGHT LOSS d100 v8 phen 12.5   NL   WRIST SURGERY Right 08/2013   Dr. Margarita Rana    SOCIAL HISTORY:  Social History   Socioeconomic History   Marital status: Divorced    Spouse name: Not on file   Number of children: 1   Years of education: Not on file   Highest  education level: Associate degree: occupational, Scientist, product/process development, or vocational program  Occupational History   Occupation: Disabled/retired  Tobacco Use   Smoking status: Former    Current packs/day: 0.00    Average packs/day: 1.5 packs/day for 8.0 years (12.0 ttl pk-yrs)    Types: Cigarettes    Start date: 08/13/1978    Quit date: 08/13/1986    Years since  quitting: 37.2    Passive exposure: Current   Smokeless tobacco: Never   Tobacco comments:    quit 20 + yrs ago  Vaping Use   Vaping status: Never Used  Substance and Sexual Activity   Alcohol use: Not Currently    Comment: Used to drink heavily- went to treatment in May 2020. No alcohol in 3 years (03/13/2022)   Drug use: Not Currently    Comment: pain medications, klonopin- none in the last 17 months. Currently in AA   Sexual activity: Not Currently  Other Topics Concern   Not on file  Social History Narrative   Not on file   Social Drivers of Health   Financial Resource Strain: Low Risk  (10/28/2023)   Overall Financial Resource Strain (CARDIA)    Difficulty of Paying Living Expenses: Not hard at all  Food Insecurity: No Food Insecurity (10/28/2023)   Hunger Vital Sign    Worried About Running Out of Food in the Last Year: Never true    Ran Out of Food in the Last Year: Never true  Transportation Needs: No Transportation Needs (10/28/2023)   PRAPARE - Administrator, Civil Service (Medical): No    Lack of Transportation (Non-Medical): No  Physical Activity: Sufficiently Active (10/28/2023)   Exercise Vital Sign    Days of Exercise per Week: 7 days    Minutes of Exercise per Session: 150+ min  Stress: Stress Concern Present (10/28/2023)   [employer] of Occupational Health - Occupational Stress Questionnaire    Feeling of Stress : To some extent  Social Connections: Moderately Integrated (10/28/2023)   Social Connection and Isolation Panel [NHANES]    Frequency of Communication with Friends and Family: More than  three times a week    Frequency of Social Gatherings with Friends and Family: More than three times a week    Attends Religious Services: 1 to 4 times per year    Active Member of [employer] or Organizations: Yes    Attends Engineer, structural: More than 4 times per year    Marital Status: Divorced  Intimate Partner Violence: Unknown (06/08/2023)   Received from [facility]   HITS    Physically Hurt: Not on file    Insult or Talk Down To: Not on file    Threaten Physical Harm: Not on file    Scream or Curse: Not on file    FAMILY HISTORY:  Family History  Problem Relation Age of Onset   Bladder Cancer Mother        rare form    Cancer Sister        Metastatic abdominal   Emotional abuse Sister    Stroke Sister    Emotional abuse Sister    Heart failure Father    Stroke Father    Alcohol abuse Father    Dementia Paternal Aunt    Alcohol abuse Paternal Uncle    Dementia Paternal Uncle    Dementia Paternal Grandmother    Stroke Paternal Grandmother    ADD / ADHD Cousin    Bipolar disorder Cousin    Anxiety disorder Cousin    Depression Cousin        Committed suicide   Alcohol abuse Cousin    OCD Cousin    Paranoid behavior Cousin    Brain cancer Maternal Grandfather    Heart defect Other        FAMILY HX   Colon cancer Maternal Uncle    Colon  cancer Cousin    Colon polyps Neg Hx    Drug abuse Neg Hx    Schizophrenia Neg Hx    Seizures Neg Hx    Sexual abuse Neg Hx    Physical abuse Neg Hx     CURRENT MEDICATIONS:  Outpatient Encounter Medications as of 11/17/2023  Medication Sig Note   AMBULATORY NON FORMULARY MEDICATION Kratom Take 2 tablet by mouth twice daily    AMBULATORY NON FORMULARY MEDICATION Friska caffiene 1-2 tablet twice daily    amLODipine (NORVASC) 5 MG tablet Take 1 tablet (5 mg total) by mouth daily.    baclofen (LIORESAL) 10 MG tablet TAKE (1/2) TABLET (5MG ) BY MOUTH THREE TIMES DAILY BEFORE A MEAL. IF TOLERATED AFTER THREE DAY, YOU  CAN INCREASE TO 10MG  BEFORE YOUR LARGEST MEAL AND CONTINUE 5MG  BEFORE TWO OTHER MEALS FOR TOTAL OF 20MG  DAILY 08/18/2023: One bid    cevimeline (EVOXAC) 30 MG capsule Take 1 capsule (30 mg total) by mouth 3 (three) times daily.    famotidine (PEPCID) 20 MG tablet Take 1 tablet (20 mg total) by mouth 2 (two) times daily.    gabapentin (NEURONTIN) 100 MG capsule Take 100 mg by mouth 3 (three) times daily.    hydroxychloroquine (PLAQUENIL) 200 MG tablet Take 1 tablet by mouth daily.    hyoscyamine (LEVSIN) 0.125 MG tablet Take by mouth every 4 (four) hours as needed.    lubiprostone (AMITIZA) 24 MCG capsule TAKE 1 CAPSULE TWICE DAILY WITH MEALS.    ondansetron (ZOFRAN) 4 MG tablet Take one tablet by mouth once daily as needed , for nausea    OVER THE COUNTER MEDICATION Keto supplement 2 daily    pilocarpine (SALAGEN) 5 MG tablet Take 5 mg by mouth 3 (three) times daily. 08/18/2023: 4 per day    RABEprazole (ACIPHEX) 20 MG tablet Take 20 mg by mouth daily.    senna (SENOKOT) 8.6 MG tablet Take 2 tablets by mouth 2 (two) times daily.    sucralfate (CARAFATE) 1 g tablet Take 1 tablet (1 g total) by mouth 4 (four) times daily.    Vitamin D3 (VITAMIN D) 25 MCG tablet Take 4,000 Units by mouth daily.     [DISCONTINUED] copper tablet Take 3 tablets (6 mg total) by mouth daily.    copper tablet Take 2 tablets (4 mg total) by mouth 2 (two) times daily.    [EXPIRED] lidocaine (XYLOCAINE) 2 % solution Use as directed 15 mLs in the mouth or throat every 6 (six) hours as needed (Abdominal pain).    [DISCONTINUED] escitalopram (LEXAPRO) 10 MG tablet Take 1 tablet (10 mg total) by mouth daily. (Patient not taking: Reported on 11/16/2023)    [DISCONTINUED] predniSONE (DELTASONE) 10 MG tablet Take 1 tablet (10 mg total) by mouth 2 (two) times daily with a meal. (Patient not taking: Reported on 11/16/2023)    No facility-administered encounter medications on file as of 11/17/2023.    ALLERGIES:  Allergies  Allergen  Reactions   Levofloxacin Nausea Only and Other (See Comments)    "Creepy" feeling, skin didn't feel right, sort of itchy.   Ciprofloxacin     Sweating   Sulfa Antibiotics     LABORATORY DATA:  I have reviewed the labs as listed.  CBC    Component Value Date/Time   WBC 6.5 07/05/2023 1049   RBC 4.83 07/05/2023 1049   HGB 14.3 07/05/2023 1049   HGB 13.7 10/15/2020 1021   HCT 44.3 07/05/2023 1049   HCT 39.2  10/15/2020 1021   PLT 184 07/05/2023 1049   PLT 140 (L) 10/15/2020 1021   MCV 91.7 07/05/2023 1049   MCV 92 10/15/2020 1021   MCH 29.6 07/05/2023 1049   MCHC 32.3 07/05/2023 1049   RDW 12.8 07/05/2023 1049   RDW 12.0 10/15/2020 1021   LYMPHSABS 1.0 07/05/2023 1049   LYMPHSABS 1.3 10/15/2020 1021   MONOABS 0.4 07/05/2023 1049   EOSABS 0.2 07/05/2023 1049   EOSABS 0.2 10/15/2020 1021   BASOSABS 0.0 07/05/2023 1049   BASOSABS 0.0 10/15/2020 1021      Latest Ref Rng & Units 11/10/2023    9:18 AM 07/05/2023   10:49 AM 04/15/2023   10:06 AM  CMP  Glucose 70 - 99 mg/dL 97  161  096   BUN 8 - 23 mg/dL 13  18  15    Creatinine 0.61 - 1.24 mg/dL 0.45  4.09  8.11   Sodium 135 - 145 mmol/L 139  139  142   Potassium 3.5 - 5.1 mmol/L 3.8  3.6  4.1   Chloride 98 - 111 mmol/L 103  106  107   CO2 22 - 32 mmol/L 28  25  24    Calcium 8.9 - 10.3 mg/dL 9.3  9.1  9.7   Total Protein 6.5 - 8.1 g/dL 7.4  7.4  6.7   Total Bilirubin 0.0 - 1.2 mg/dL 0.5  0.5  0.2   Alkaline Phos 38 - 126 U/L 82  76  96   AST 15 - 41 U/L 17  29  21    ALT 0 - 44 U/L 20  31  16      DIAGNOSTIC IMAGING:  I have independently reviewed the relevant imaging and discussed with the patient.   WRAP UP:  All questions were answered. The patient knows to call the clinic with any problems, questions or concerns.  Medical decision making: Moderate  Time spent on visit: I spent 20 minutes counseling the patient face to face. The total time spent in the appointment was 30 minutes and more than 50% was on  counseling.  KELLEY KNOTH, PA-C  11/17/23 10:00 AM

## 2023-11-16 NOTE — Patient Instructions (Signed)
 Consider mini colon purge today or tomorrow. You can use bisacodyl 15mg  by mouth, then one hour later start miralax one capful hourly for five doses.  Continue amitiza as before.  I will look at previous endoscopic ultrasound and discuss with Dr. Marletta Lor about MRI pancreas findings. Further recommendations to follow.  Please reach out to [facility] regarding ongoing swallowing/chest symptoms.

## 2023-11-17 ENCOUNTER — Inpatient Hospital Stay (HOSPITAL_BASED_OUTPATIENT_CLINIC_OR_DEPARTMENT_OTHER): Payer: Medicare HMO | Admitting: Physician Assistant

## 2023-11-17 VITALS — BP 150/85 | HR 62 | Temp 97.5°F | Resp 20 | Wt 137.6 lb

## 2023-11-17 DIAGNOSIS — D509 Iron deficiency anemia, unspecified: Secondary | ICD-10-CM | POA: Diagnosis not present

## 2023-11-17 DIAGNOSIS — D696 Thrombocytopenia, unspecified: Secondary | ICD-10-CM | POA: Diagnosis not present

## 2023-11-17 DIAGNOSIS — E610 Copper deficiency: Secondary | ICD-10-CM

## 2023-11-17 DIAGNOSIS — D500 Iron deficiency anemia secondary to blood loss (chronic): Secondary | ICD-10-CM | POA: Diagnosis not present

## 2023-11-17 DIAGNOSIS — D72819 Decreased white blood cell count, unspecified: Secondary | ICD-10-CM | POA: Diagnosis not present

## 2023-11-17 DIAGNOSIS — M9905 Segmental and somatic dysfunction of pelvic region: Secondary | ICD-10-CM | POA: Diagnosis not present

## 2023-11-17 DIAGNOSIS — R779 Abnormality of plasma protein, unspecified: Secondary | ICD-10-CM | POA: Diagnosis not present

## 2023-11-17 DIAGNOSIS — M5442 Lumbago with sciatica, left side: Secondary | ICD-10-CM | POA: Diagnosis not present

## 2023-11-17 DIAGNOSIS — M9902 Segmental and somatic dysfunction of thoracic region: Secondary | ICD-10-CM | POA: Diagnosis not present

## 2023-11-17 DIAGNOSIS — G894 Chronic pain syndrome: Secondary | ICD-10-CM | POA: Diagnosis not present

## 2023-11-17 DIAGNOSIS — M069 Rheumatoid arthritis, unspecified: Secondary | ICD-10-CM | POA: Diagnosis not present

## 2023-11-17 DIAGNOSIS — M9903 Segmental and somatic dysfunction of lumbar region: Secondary | ICD-10-CM | POA: Diagnosis not present

## 2023-11-17 DIAGNOSIS — R778 Other specified abnormalities of plasma proteins: Secondary | ICD-10-CM

## 2023-11-17 DIAGNOSIS — K219 Gastro-esophageal reflux disease without esophagitis: Secondary | ICD-10-CM | POA: Diagnosis not present

## 2023-11-17 DIAGNOSIS — M109 Gout, unspecified: Secondary | ICD-10-CM | POA: Diagnosis not present

## 2023-11-17 MED ORDER — COPPER 2 MG PO TABS: 4.0000 mg | Freq: Two times a day (BID) | 5 refills | Status: AC

## 2023-11-17 NOTE — Patient Instructions (Signed)
 [facility] at [facility] **VISIT SUMMARY & IMPORTANT INSTRUCTIONS **   You were seen today by Rojelio Brenner PA-C for your follow-up visit.    IRON DEFICIENCY ANEMIA Your iron levels have improved.  You do not need to take any iron supplement right now. You have previously had gastritis/duodenitis (irritation in your stomach and small intestine) from taking NSAID medications.  It is EXTREMELY IMPORTANT that you stop taking all ibuprofen or other NSAID medications at this time.  LOW COPPER Your copper levels are low.  INCREASE copper to take 8 mg daily.  FOLLOW-UP APPOINTMENT: Labs in 6 months with office visit 1 week after  ** Thank you for trusting me with your healthcare!  I strive to provide all of my patients with quality care at each visit.  If you receive a survey for this visit, I would be so grateful to you for taking the time to provide feedback.  Thank you in advance!  ~ Savas Elvin                   Dr. Doreatha Massed   &   Rojelio Brenner, PA-C   - - - - - - - - - - - - - - - - - -    Thank you for choosing [facility] at [facility] to provide your oncology and hematology care.  To afford each patient quality time with our provider, please arrive at least 15 minutes before your scheduled appointment time.   If you have a lab appointment with the Cancer Center please come in thru the Main Entrance and check in at the main information desk.  You need to re-schedule your appointment should you arrive 10 or more minutes late.  We strive to give you quality time with our providers, and arriving late affects you and other patients whose appointments are after yours.  Also, if you no show three or more times for appointments you may be dismissed from the clinic at the providers discretion.     Again, thank you for choosing [facility].  Our hope is that these requests will decrease the amount of time that you wait  before being seen by our physicians.       _____________________________________________________________  Should you have questions after your visit to [facility], please contact our office at [phone] and follow the prompts.  Our office hours are 8:00 a.m. and 4:30 p.m. Monday - Friday.  Please note that voicemails left after 4:00 p.m. may not be returned until the following business day.  We are closed weekends and major holidays.  You do have access to a nurse 24-7, just call the main number to the clinic [phone] and do not press any options, hold on the line and a nurse will answer the phone.    For prescription refill requests, have your pharmacy contact our office and allow 72 hours.

## 2023-11-22 DIAGNOSIS — M9905 Segmental and somatic dysfunction of pelvic region: Secondary | ICD-10-CM | POA: Diagnosis not present

## 2023-11-22 DIAGNOSIS — M9903 Segmental and somatic dysfunction of lumbar region: Secondary | ICD-10-CM | POA: Diagnosis not present

## 2023-11-22 DIAGNOSIS — M5442 Lumbago with sciatica, left side: Secondary | ICD-10-CM | POA: Diagnosis not present

## 2023-11-22 DIAGNOSIS — M9902 Segmental and somatic dysfunction of thoracic region: Secondary | ICD-10-CM | POA: Diagnosis not present

## 2023-11-24 DIAGNOSIS — M9903 Segmental and somatic dysfunction of lumbar region: Secondary | ICD-10-CM | POA: Diagnosis not present

## 2023-11-24 DIAGNOSIS — M5442 Lumbago with sciatica, left side: Secondary | ICD-10-CM | POA: Diagnosis not present

## 2023-11-24 DIAGNOSIS — M9905 Segmental and somatic dysfunction of pelvic region: Secondary | ICD-10-CM | POA: Diagnosis not present

## 2023-11-24 DIAGNOSIS — M9902 Segmental and somatic dysfunction of thoracic region: Secondary | ICD-10-CM | POA: Diagnosis not present

## 2023-11-25 DIAGNOSIS — H52223 Regular astigmatism, bilateral: Secondary | ICD-10-CM | POA: Diagnosis not present

## 2023-11-29 DIAGNOSIS — M9903 Segmental and somatic dysfunction of lumbar region: Secondary | ICD-10-CM | POA: Diagnosis not present

## 2023-11-29 DIAGNOSIS — M5442 Lumbago with sciatica, left side: Secondary | ICD-10-CM | POA: Diagnosis not present

## 2023-11-29 DIAGNOSIS — M9905 Segmental and somatic dysfunction of pelvic region: Secondary | ICD-10-CM | POA: Diagnosis not present

## 2023-11-29 DIAGNOSIS — M9902 Segmental and somatic dysfunction of thoracic region: Secondary | ICD-10-CM | POA: Diagnosis not present

## 2023-12-01 DIAGNOSIS — M5442 Lumbago with sciatica, left side: Secondary | ICD-10-CM | POA: Diagnosis not present

## 2023-12-01 DIAGNOSIS — M9905 Segmental and somatic dysfunction of pelvic region: Secondary | ICD-10-CM | POA: Diagnosis not present

## 2023-12-01 DIAGNOSIS — M9903 Segmental and somatic dysfunction of lumbar region: Secondary | ICD-10-CM | POA: Diagnosis not present

## 2023-12-01 DIAGNOSIS — M9902 Segmental and somatic dysfunction of thoracic region: Secondary | ICD-10-CM | POA: Diagnosis not present

## 2023-12-03 ENCOUNTER — Other Ambulatory Visit: Payer: Self-pay | Admitting: Family Medicine

## 2023-12-03 DIAGNOSIS — M3500 Sicca syndrome, unspecified: Secondary | ICD-10-CM | POA: Diagnosis not present

## 2023-12-04 ENCOUNTER — Other Ambulatory Visit: Payer: Self-pay | Admitting: Family Medicine

## 2023-12-06 DIAGNOSIS — M5442 Lumbago with sciatica, left side: Secondary | ICD-10-CM | POA: Diagnosis not present

## 2023-12-06 DIAGNOSIS — M9902 Segmental and somatic dysfunction of thoracic region: Secondary | ICD-10-CM | POA: Diagnosis not present

## 2023-12-06 DIAGNOSIS — M9903 Segmental and somatic dysfunction of lumbar region: Secondary | ICD-10-CM | POA: Diagnosis not present

## 2023-12-06 DIAGNOSIS — M9905 Segmental and somatic dysfunction of pelvic region: Secondary | ICD-10-CM | POA: Diagnosis not present

## 2023-12-07 ENCOUNTER — Ambulatory Visit (HOSPITAL_COMMUNITY)
Admission: RE | Admit: 2023-12-07 | Discharge: 2023-12-07 | Disposition: A | Discharge status: Home / Self Care | Source: Ambulatory Visit

## 2023-12-07 ENCOUNTER — Ambulatory Visit (INDEPENDENT_AMBULATORY_CARE_PROVIDER_SITE_OTHER)

## 2023-12-07 VITALS — BP 161/78 | HR 60 | Ht 69.0 in | Wt 143.0 lb

## 2023-12-07 DIAGNOSIS — G894 Chronic pain syndrome: Secondary | ICD-10-CM | POA: Diagnosis not present

## 2023-12-07 DIAGNOSIS — M48061 Spinal stenosis, lumbar region without neurogenic claudication: Secondary | ICD-10-CM | POA: Diagnosis not present

## 2023-12-07 DIAGNOSIS — M4316 Spondylolisthesis, lumbar region: Secondary | ICD-10-CM | POA: Diagnosis not present

## 2023-12-07 DIAGNOSIS — E559 Vitamin D deficiency, unspecified: Secondary | ICD-10-CM | POA: Diagnosis not present

## 2023-12-07 DIAGNOSIS — M47816 Spondylosis without myelopathy or radiculopathy, lumbar region: Secondary | ICD-10-CM | POA: Diagnosis not present

## 2023-12-07 DIAGNOSIS — I10 Essential (primary) hypertension: Secondary | ICD-10-CM

## 2023-12-07 DIAGNOSIS — M5442 Lumbago with sciatica, left side: Secondary | ICD-10-CM | POA: Insufficient documentation

## 2023-12-07 DIAGNOSIS — M549 Dorsalgia, unspecified: Secondary | ICD-10-CM | POA: Diagnosis not present

## 2023-12-07 MED ORDER — GABAPENTIN 300 MG PO CAPS: 300.0000 mg | ORAL_CAPSULE | Freq: Three times a day (TID) | ORAL | Status: AC

## 2023-12-07 MED ORDER — PREDNISONE 10 MG (21) PO TBPK
ORAL_TABLET | ORAL | 0 refills | Status: DC
Start: 2023-12-07 — End: 2024-05-22

## 2023-12-07 NOTE — Progress Notes (Unsigned)
   Established Patient Office Visit  Subjective   Patient ID: Marc [name], male    DOB: June 06, 1956  Age: 68 y.o. MRN: [id number]  Chief Complaint  Patient presents with   Medical Management of Chronic Issues    Pt here to discuss his "Siatic nerve problem" and his "Chiropractor Dr. Carolynne Citron wants an MRI of his spine"    HPI Back Pain: Patient presents for presents evaluation of low back problems.  Symptoms have been present for 2 months and include pain in left lower back  ({desc; pain character:10965} in character; 9/10 in severity) and paresthesias in ***. Initial inciting event: {none:33079}. Symptoms are worst: {time of day:16448}. Alleviating factors identifiable by patient are {back exacerbating/alleviating factors:16449}. Exacerbating factors identifiable by patient are {back exacerbating/alleviating factors:16449}. Treatments so far initiated by patient: {none:33079} Previous lower back problems: {none:33079}. Previous workup: {none:33079}. Previous treatments: {none:33079}.  He has been seeing chiropractor, Dr. Carolynne Citron, for the past 4 weeks.   Dr. Carolynne Citron has recommended that he have an MRI performed.    ROS    Objective:     BP (!) 161/78   Pulse 60   Ht 5\' 9"  (1.753 m)   Wt 143 lb 0.6 oz (64.9 kg)   SpO2 98%   BMI 21.12 kg/m  BP Readings from Last 3 Encounters:  12/07/23 (!) 161/78  11/17/23 (!) 150/85  11/16/23 137/80   Wt Readings from Last 3 Encounters:  12/07/23 143 lb 0.6 oz (64.9 kg)  11/17/23 137 lb 9.1 oz (62.4 kg)  11/16/23 139 lb 6.4 oz (63.2 kg)      Physical Exam   No results found for any visits on 12/07/23.  {Labs (Optional):23779}  The 10-year ASCVD risk score (Arnett DK, et al., 2019) is: 25.6%    Assessment & Plan:   Problem List Items Addressed This Visit   None   No follow-ups on file.    Alison Irvine, FNP

## 2023-12-08 LAB — CBC WITH DIFFERENTIAL/PLATELET
Basophils Absolute: 0 10*3/uL (ref 0.0–0.2)
Basos: 1 %
EOS (ABSOLUTE): 0.2 10*3/uL (ref 0.0–0.4)
Eos: 4 %
Hematocrit: 39.1 % (ref 37.5–51.0)
Hemoglobin: 13.2 g/dL (ref 13.0–17.7)
Immature Grans (Abs): 0 10*3/uL (ref 0.0–0.1)
Immature Granulocytes: 0 %
Lymphocytes Absolute: 1.2 10*3/uL (ref 0.7–3.1)
Lymphs: 23 %
MCH: 30.3 pg (ref 26.6–33.0)
MCHC: 33.8 g/dL (ref 31.5–35.7)
MCV: 90 fL (ref 79–97)
Monocytes Absolute: 0.5 10*3/uL (ref 0.1–0.9)
Monocytes: 8 %
Neutrophils Absolute: 3.5 10*3/uL (ref 1.4–7.0)
Neutrophils: 64 %
Platelets: 192 10*3/uL (ref 150–450)
RBC: 4.36 x10E6/uL (ref 4.14–5.80)
RDW: 12.6 % (ref 11.6–15.4)
WBC: 5.4 10*3/uL (ref 3.4–10.8)

## 2023-12-08 LAB — LIPID PANEL
Chol/HDL Ratio: 3 ratio (ref 0.0–5.0)
Cholesterol, Total: 216 mg/dL — ABNORMAL HIGH (ref 100–199)
HDL: 72 mg/dL (ref >39)
LDL Chol Calc (NIH): 134 mg/dL — ABNORMAL HIGH (ref 0–99)
Triglycerides: 58 mg/dL (ref 0–149)
VLDL Cholesterol Cal: 10 mg/dL (ref 5–40)

## 2023-12-08 LAB — TSH+FREE T4
Free T4: 1.04 ng/dL (ref 0.82–1.77)
TSH: 4.01 u[IU]/mL (ref 0.450–4.500)

## 2023-12-08 LAB — CMP14+EGFR
ALT: 15 IU/L (ref 0–44)
AST: 18 IU/L (ref 0–40)
Albumin: 4.3 g/dL (ref 3.9–4.9)
Alkaline Phosphatase: 125 IU/L — ABNORMAL HIGH (ref 44–121)
BUN/Creatinine Ratio: 17 (ref 10–24)
BUN: 14 mg/dL (ref 8–27)
Bilirubin Total: 0.3 mg/dL (ref 0.0–1.2)
CO2: 26 mmol/L (ref 20–29)
Calcium: 9.5 mg/dL (ref 8.6–10.2)
Chloride: 105 mmol/L (ref 96–106)
Creatinine, Ser: 0.83 mg/dL (ref 0.76–1.27)
Globulin, Total: 2 g/dL (ref 1.5–4.5)
Glucose: 114 mg/dL — ABNORMAL HIGH (ref 70–99)
Potassium: 4.4 mmol/L (ref 3.5–5.2)
Sodium: 143 mmol/L (ref 134–144)
Total Protein: 6.3 g/dL (ref 6.0–8.5)
eGFR: 94 mL/min/{1.73_m2} (ref >59)

## 2023-12-08 LAB — VITAMIN D 25 HYDROXY (VIT D DEFICIENCY, FRACTURES): Vit D, 25-Hydroxy: 25.7 ng/mL — ABNORMAL LOW (ref 30.0–100.0)

## 2023-12-08 NOTE — Assessment & Plan Note (Signed)
 Acute on chronic pain.  Will update x-ray of lumbar before proceeding with MRI.  He states chiropractor has not done x-rays recently.  No red flags on exam.  Will add steroid taper

## 2023-12-08 NOTE — Assessment & Plan Note (Signed)
 Recommend increasing gabapentin  from 100 mg 3 times a day to 300 mg 3 times a day.  Patient is in agreement.  Recommend starting with increased dose at bedtime first and increasing dose during the day

## 2023-12-08 NOTE — Assessment & Plan Note (Addendum)
 Elevated today but could be due to pain we will check routine labs.  Continue with current dose of blood pressure medicine and recommend DASH diet.  Monitor BP at home.  Recommend follow-up in 3 months or sooner if BP remains above 140/90. Recommend smoking cessation

## 2023-12-08 NOTE — Assessment & Plan Note (Signed)
 Recheck levels.  Follow-up according to lab results

## 2023-12-12 ENCOUNTER — Other Ambulatory Visit: Payer: Self-pay

## 2023-12-12 DIAGNOSIS — E559 Vitamin D deficiency, unspecified: Secondary | ICD-10-CM

## 2023-12-12 MED ORDER — VITAMIN D (ERGOCALCIFEROL) 1.25 MG (50000 UNIT) PO CAPS: 50000.0000 [IU] | ORAL_CAPSULE | ORAL | 5 refills | Status: AC

## 2023-12-13 ENCOUNTER — Other Ambulatory Visit: Payer: Self-pay | Admitting: Family Medicine

## 2023-12-15 DIAGNOSIS — M5442 Lumbago with sciatica, left side: Secondary | ICD-10-CM | POA: Diagnosis not present

## 2023-12-15 DIAGNOSIS — M9903 Segmental and somatic dysfunction of lumbar region: Secondary | ICD-10-CM | POA: Diagnosis not present

## 2023-12-15 DIAGNOSIS — M9902 Segmental and somatic dysfunction of thoracic region: Secondary | ICD-10-CM | POA: Diagnosis not present

## 2023-12-15 DIAGNOSIS — M9905 Segmental and somatic dysfunction of pelvic region: Secondary | ICD-10-CM | POA: Diagnosis not present

## 2023-12-20 DIAGNOSIS — G8929 Other chronic pain: Secondary | ICD-10-CM | POA: Diagnosis not present

## 2023-12-20 DIAGNOSIS — M545 Low back pain, unspecified: Secondary | ICD-10-CM | POA: Diagnosis not present

## 2023-12-20 DIAGNOSIS — M792 Neuralgia and neuritis, unspecified: Secondary | ICD-10-CM | POA: Diagnosis not present

## 2023-12-22 DIAGNOSIS — M5442 Lumbago with sciatica, left side: Secondary | ICD-10-CM | POA: Diagnosis not present

## 2023-12-22 DIAGNOSIS — M9903 Segmental and somatic dysfunction of lumbar region: Secondary | ICD-10-CM | POA: Diagnosis not present

## 2023-12-22 DIAGNOSIS — M9905 Segmental and somatic dysfunction of pelvic region: Secondary | ICD-10-CM | POA: Diagnosis not present

## 2023-12-22 DIAGNOSIS — M9902 Segmental and somatic dysfunction of thoracic region: Secondary | ICD-10-CM | POA: Diagnosis not present

## 2023-12-27 ENCOUNTER — Telehealth: Payer: Self-pay

## 2023-12-27 ENCOUNTER — Other Ambulatory Visit: Payer: Self-pay | Admitting: Family Medicine

## 2023-12-27 DIAGNOSIS — M5442 Lumbago with sciatica, left side: Secondary | ICD-10-CM

## 2023-12-27 NOTE — Telephone Encounter (Unsigned)
 Copied from CRM [id number]. Topic: Clinical - Medication Refill >> Dec 27, 2023  1:53 PM Ary Bitter R wrote: Medication: predniSONE  (STERAPRED UNI-PAK 21 TAB) 10 MG (21) TBPK tablet   Has the patient contacted their pharmacy? Yes, no rx on file  This is the patient's preferred pharmacy:  [facility] [place], [place] - [id number] [place] [address] Phone: [phone] Fax: [phone]  Is this the correct pharmacy for this prescription? Yes If no, delete pharmacy and type the correct one.   Has the prescription been filled recently? Yes  Is the patient out of the medication? Yes  Has the patient been seen for an appointment in the last year OR does the patient have an upcoming appointment? Yes  Can we respond through MyChart? Yes  Agent: Please be advised that Rx refills may take up to 3 business days. We ask that you follow-up with your pharmacy.

## 2023-12-27 NOTE — Telephone Encounter (Signed)
 Copied from CRM [id number]. Topic: Clinical - Lab/Test Results >> Dec 27, 2023  1:57 PM Opal Bill wrote: Reason for CRM: Pt still waiting to get the results from his xrays done on 12/07/23. Also asking about an MRI of the spine. Please f/u

## 2023-12-28 ENCOUNTER — Other Ambulatory Visit: Payer: Self-pay

## 2023-12-28 DIAGNOSIS — M5442 Lumbago with sciatica, left side: Secondary | ICD-10-CM

## 2023-12-29 ENCOUNTER — Telehealth: Payer: Self-pay

## 2023-12-29 DIAGNOSIS — M9905 Segmental and somatic dysfunction of pelvic region: Secondary | ICD-10-CM | POA: Diagnosis not present

## 2023-12-29 DIAGNOSIS — M9902 Segmental and somatic dysfunction of thoracic region: Secondary | ICD-10-CM | POA: Diagnosis not present

## 2023-12-29 DIAGNOSIS — M5442 Lumbago with sciatica, left side: Secondary | ICD-10-CM | POA: Diagnosis not present

## 2023-12-29 DIAGNOSIS — M9903 Segmental and somatic dysfunction of lumbar region: Secondary | ICD-10-CM | POA: Diagnosis not present

## 2023-12-29 NOTE — Telephone Encounter (Signed)
 Copied from CRM [id number]. Topic: Clinical - Medication Refill >> Dec 27, 2023  1:53 PM Ary Bitter R wrote: Medication: predniSONE  (STERAPRED UNI-PAK 21 TAB) 10 MG (21) TBPK tablet   Has the patient contacted their pharmacy? Yes, no rx on file  This is the patient's preferred pharmacy:  [facility] [place], [place] - [id number] [place] [address] Phone: [phone] Fax: [phone]  Is this the correct pharmacy for this prescription? Yes If no, delete pharmacy and type the correct one.   Has the prescription been filled recently? Yes  Is the patient out of the medication? Yes  Has the patient been seen for an appointment in the last year OR does the patient have an upcoming appointment? Yes  Can we respond through MyChart? Yes  Agent: Please be advised that Rx refills may take up to 3 business days. We ask that you follow-up with your pharmacy. >> Dec 29, 2023 12:48 PM Emylou G wrote: Checking status of refill??

## 2024-01-03 ENCOUNTER — Other Ambulatory Visit: Payer: Self-pay | Admitting: Family Medicine

## 2024-01-06 NOTE — Telephone Encounter (Signed)
 Sent My-Chart message

## 2024-01-12 DIAGNOSIS — M9902 Segmental and somatic dysfunction of thoracic region: Secondary | ICD-10-CM | POA: Diagnosis not present

## 2024-01-12 DIAGNOSIS — M9905 Segmental and somatic dysfunction of pelvic region: Secondary | ICD-10-CM | POA: Diagnosis not present

## 2024-01-12 DIAGNOSIS — M5442 Lumbago with sciatica, left side: Secondary | ICD-10-CM | POA: Diagnosis not present

## 2024-01-12 DIAGNOSIS — M9903 Segmental and somatic dysfunction of lumbar region: Secondary | ICD-10-CM | POA: Diagnosis not present

## 2024-01-18 DIAGNOSIS — R071 Chest pain on breathing: Secondary | ICD-10-CM | POA: Diagnosis not present

## 2024-01-18 DIAGNOSIS — R1110 Vomiting, unspecified: Secondary | ICD-10-CM | POA: Diagnosis not present

## 2024-01-18 DIAGNOSIS — R09A2 Foreign body sensation, throat: Secondary | ICD-10-CM | POA: Diagnosis not present

## 2024-01-19 DIAGNOSIS — M9905 Segmental and somatic dysfunction of pelvic region: Secondary | ICD-10-CM | POA: Diagnosis not present

## 2024-01-19 DIAGNOSIS — M9903 Segmental and somatic dysfunction of lumbar region: Secondary | ICD-10-CM | POA: Diagnosis not present

## 2024-01-19 DIAGNOSIS — M5442 Lumbago with sciatica, left side: Secondary | ICD-10-CM | POA: Diagnosis not present

## 2024-01-19 DIAGNOSIS — M9902 Segmental and somatic dysfunction of thoracic region: Secondary | ICD-10-CM | POA: Diagnosis not present

## 2024-01-23 ENCOUNTER — Ambulatory Visit (HOSPITAL_COMMUNITY)
Admission: RE | Admit: 2024-01-23 | Discharge: 2024-01-23 | Disposition: A | Discharge status: Home / Self Care | Source: Ambulatory Visit

## 2024-01-23 DIAGNOSIS — M5442 Lumbago with sciatica, left side: Secondary | ICD-10-CM | POA: Diagnosis not present

## 2024-01-23 DIAGNOSIS — Z9682 Presence of neurostimulator: Secondary | ICD-10-CM | POA: Diagnosis not present

## 2024-01-23 DIAGNOSIS — M48061 Spinal stenosis, lumbar region without neurogenic claudication: Secondary | ICD-10-CM | POA: Diagnosis not present

## 2024-01-23 DIAGNOSIS — M5136 Other intervertebral disc degeneration, lumbar region with discogenic back pain only: Secondary | ICD-10-CM | POA: Diagnosis not present

## 2024-01-23 DIAGNOSIS — M5137 Other intervertebral disc degeneration, lumbosacral region with discogenic back pain only: Secondary | ICD-10-CM | POA: Diagnosis not present

## 2024-01-23 MED ORDER — GADOBUTROL 1 MMOL/ML IV SOLN
6.0000 mL | Freq: Once | INTRAVENOUS | Status: AC | PRN
Start: 2024-01-23 — End: 2024-01-23
  Administered 2024-01-23: 6 mL via INTRAVENOUS

## 2024-01-26 DIAGNOSIS — M9902 Segmental and somatic dysfunction of thoracic region: Secondary | ICD-10-CM | POA: Diagnosis not present

## 2024-01-26 DIAGNOSIS — M9903 Segmental and somatic dysfunction of lumbar region: Secondary | ICD-10-CM | POA: Diagnosis not present

## 2024-01-26 DIAGNOSIS — M9905 Segmental and somatic dysfunction of pelvic region: Secondary | ICD-10-CM | POA: Diagnosis not present

## 2024-01-26 DIAGNOSIS — M5442 Lumbago with sciatica, left side: Secondary | ICD-10-CM | POA: Diagnosis not present

## 2024-02-02 DIAGNOSIS — M5441 Lumbago with sciatica, right side: Secondary | ICD-10-CM | POA: Diagnosis not present

## 2024-02-02 DIAGNOSIS — M9905 Segmental and somatic dysfunction of pelvic region: Secondary | ICD-10-CM | POA: Diagnosis not present

## 2024-02-02 DIAGNOSIS — M9903 Segmental and somatic dysfunction of lumbar region: Secondary | ICD-10-CM | POA: Diagnosis not present

## 2024-02-02 DIAGNOSIS — M9902 Segmental and somatic dysfunction of thoracic region: Secondary | ICD-10-CM | POA: Diagnosis not present

## 2024-02-03 ENCOUNTER — Other Ambulatory Visit: Payer: Self-pay | Admitting: Family Medicine

## 2024-02-04 ENCOUNTER — Telehealth: Payer: Self-pay | Admitting: Family Medicine

## 2024-02-04 MED ORDER — AMLODIPINE BESYLATE 5 MG PO TABS: 5.0000 mg | ORAL_TABLET | Freq: Every day | ORAL | 2 refills | Status: DC

## 2024-02-04 NOTE — Telephone Encounter (Signed)
 Copied from CRM [id number]. Topic: Clinical - Medication Refill >> Feb 04, 2024  1:04 PM DeAngela L wrote: Medication: amLODipine  (NORVASC ) 5 MG tablet   Has the patient contacted their pharmacy? Yes (Agent: If no, request that the patient contact the pharmacy for the refill. If patient does not wish to contact the pharmacy document the reason why and proceed with request.) (Agent: If yes, when and what did the pharmacy advise?)  This is the patient's preferred pharmacy:  [facility] [place], [place] - [id number] [place] [address] Phone: [phone] Fax: [phone]  Is this the correct pharmacy for this prescription? Yes If no, delete pharmacy and type the correct one.   Has the prescription been filled recently? Yes   Is the patient out of the medication? Yes   Has the patient been seen for an appointment in the last year OR does the patient have an upcoming appointment? Yes   Can we respond through MyChart? Yes    Agent: Please be advised that Rx refills may take up to 3 business days. We ask that you follow-up with your pharmacy.

## 2024-02-12 ENCOUNTER — Other Ambulatory Visit: Payer: Self-pay | Admitting: Family Medicine

## 2024-02-14 DIAGNOSIS — M3500 Sicca syndrome, unspecified: Secondary | ICD-10-CM | POA: Diagnosis not present

## 2024-02-14 DIAGNOSIS — Z79899 Other long term (current) drug therapy: Secondary | ICD-10-CM | POA: Diagnosis not present

## 2024-02-17 ENCOUNTER — Other Ambulatory Visit: Payer: Self-pay

## 2024-02-17 ENCOUNTER — Ambulatory Visit: Payer: Self-pay

## 2024-02-17 DIAGNOSIS — M51369 Other intervertebral disc degeneration, lumbar region without mention of lumbar back pain or lower extremity pain: Secondary | ICD-10-CM

## 2024-02-21 ENCOUNTER — Other Ambulatory Visit: Payer: Self-pay

## 2024-02-21 MED ORDER — CEVIMELINE HCL 30 MG PO CAPS: 30.0000 mg | ORAL_CAPSULE | Freq: Three times a day (TID) | ORAL | 1 refills | Status: DC

## 2024-03-07 DIAGNOSIS — R12 Heartburn: Secondary | ICD-10-CM | POA: Diagnosis not present

## 2024-03-07 DIAGNOSIS — K219 Gastro-esophageal reflux disease without esophagitis: Secondary | ICD-10-CM | POA: Diagnosis not present

## 2024-03-07 DIAGNOSIS — R071 Chest pain on breathing: Secondary | ICD-10-CM | POA: Diagnosis not present

## 2024-03-21 DIAGNOSIS — M545 Low back pain, unspecified: Secondary | ICD-10-CM | POA: Diagnosis not present

## 2024-03-21 DIAGNOSIS — M47816 Spondylosis without myelopathy or radiculopathy, lumbar region: Secondary | ICD-10-CM | POA: Diagnosis not present

## 2024-03-21 DIAGNOSIS — G8929 Other chronic pain: Secondary | ICD-10-CM | POA: Diagnosis not present

## 2024-04-25 ENCOUNTER — Other Ambulatory Visit: Payer: Self-pay | Admitting: Family Medicine

## 2024-04-26 ENCOUNTER — Encounter: Payer: Self-pay | Admitting: Family Medicine

## 2024-05-13 ENCOUNTER — Encounter: Payer: Self-pay | Admitting: Physician Assistant

## 2024-05-14 ENCOUNTER — Other Ambulatory Visit: Payer: Self-pay | Admitting: Gastroenterology

## 2024-05-14 DIAGNOSIS — K5900 Constipation, unspecified: Secondary | ICD-10-CM

## 2024-05-15 NOTE — Telephone Encounter (Signed)
 He requested to see a psychiatrist in [facility] to Dr. Antonetta.  She is out of the office and they are not taking care of it.  I feel like there has to be someone in the office that can assist with this in her absence.

## 2024-05-16 ENCOUNTER — Telehealth: Payer: Self-pay

## 2024-05-16 NOTE — Telephone Encounter (Signed)
 Copied from CRM #[id number]. Topic: Referral - Status >> May 16, 2024  3:52 PM Harlene ORN wrote: Reason for CRM: Patient asked his PCP to refer him to a psychiatrist a month ago, and now she will be out until November, and still hasn't had an order written Wants Dr. Tobie or if another Provider in the office to write him out a referral instead.

## 2024-05-17 ENCOUNTER — Inpatient Hospital Stay: Attending: Physician Assistant

## 2024-05-17 DIAGNOSIS — D72819 Decreased white blood cell count, unspecified: Secondary | ICD-10-CM | POA: Insufficient documentation

## 2024-05-17 DIAGNOSIS — D500 Iron deficiency anemia secondary to blood loss (chronic): Secondary | ICD-10-CM

## 2024-05-17 DIAGNOSIS — E610 Copper deficiency: Secondary | ICD-10-CM | POA: Insufficient documentation

## 2024-05-17 DIAGNOSIS — D509 Iron deficiency anemia, unspecified: Secondary | ICD-10-CM | POA: Diagnosis not present

## 2024-05-17 DIAGNOSIS — D696 Thrombocytopenia, unspecified: Secondary | ICD-10-CM | POA: Diagnosis not present

## 2024-05-17 LAB — FERRITIN: Ferritin: 171 ng/mL (ref 24–336)

## 2024-05-17 LAB — IRON AND TIBC
Iron: 45 ug/dL (ref 45–182)
Saturation Ratios: 16 % — ABNORMAL LOW (ref 17.9–39.5)
TIBC: 274 ug/dL (ref 250–450)
UIBC: 230 ug/dL

## 2024-05-17 LAB — CBC WITH DIFFERENTIAL/PLATELET
Abs Immature Granulocytes: 0.02 K/uL (ref 0.00–0.07)
Basophils Absolute: 0 K/uL (ref 0.0–0.1)
Basophils Relative: 0 %
Eosinophils Absolute: 0.1 K/uL (ref 0.0–0.5)
Eosinophils Relative: 2 %
HCT: 39.6 % (ref 39.0–52.0)
Hemoglobin: 13.1 g/dL (ref 13.0–17.0)
Immature Granulocytes: 0 %
Lymphocytes Relative: 21 %
Lymphs Abs: 1.4 K/uL (ref 0.7–4.0)
MCH: 29.9 pg (ref 26.0–34.0)
MCHC: 33.1 g/dL (ref 30.0–36.0)
MCV: 90.4 fL (ref 80.0–100.0)
Monocytes Absolute: 0.5 K/uL (ref 0.1–1.0)
Monocytes Relative: 8 %
Neutro Abs: 4.7 K/uL (ref 1.7–7.7)
Neutrophils Relative %: 69 %
Platelets: 215 K/uL (ref 150–400)
RBC: 4.38 MIL/uL (ref 4.22–5.81)
RDW: 13.6 % (ref 11.5–15.5)
WBC: 6.8 K/uL (ref 4.0–10.5)
nRBC: 0 % (ref 0.0–0.2)

## 2024-05-17 NOTE — Telephone Encounter (Signed)
 Pt scheduled 10/13 @ 2:40

## 2024-05-20 LAB — COPPER, SERUM: Copper: 60 ug/dL — ABNORMAL LOW (ref 69–132)

## 2024-05-22 ENCOUNTER — Ambulatory Visit: Admitting: Internal Medicine

## 2024-05-22 VITALS — BP 134/76 | HR 68 | Ht 69.0 in | Wt 138.4 lb

## 2024-05-22 DIAGNOSIS — F331 Major depressive disorder, recurrent, moderate: Secondary | ICD-10-CM

## 2024-05-22 DIAGNOSIS — K219 Gastro-esophageal reflux disease without esophagitis: Secondary | ICD-10-CM | POA: Diagnosis not present

## 2024-05-22 DIAGNOSIS — Z23 Encounter for immunization: Secondary | ICD-10-CM | POA: Diagnosis not present

## 2024-05-22 DIAGNOSIS — M3506 Sjogren syndrome with peripheral nervous system involvement: Secondary | ICD-10-CM

## 2024-05-22 DIAGNOSIS — I10 Essential (primary) hypertension: Secondary | ICD-10-CM | POA: Diagnosis not present

## 2024-05-22 MED ORDER — SERTRALINE HCL 25 MG PO TABS: 25.0000 mg | ORAL_TABLET | Freq: Every day | ORAL | 2 refills | Status: DC

## 2024-05-22 NOTE — Progress Notes (Unsigned)
 Acute Office Visit  Subjective:    Patient ID: Marc [name], male    DOB: Oct 02, 1955, 68 y.o.   MRN: [id number]  Chief Complaint  Patient presents with   Depression    Needs a referral to psychiatry    Gastroesophageal Reflux    Flare up.     HPI Patient is in today for discussion of his depression symptoms.  He reports anhedonia, lack of interest in his routine activities, chronic fatigue and insomnia for the last 3 months.  He has history of depression in the past, but has not done well with some SSRIs - namely Lexapro , Celexa; and Wellbutrin.  He reports feeling miserable due to Sjogren syndrome.  He also reports feeling overwhelmed as his ex-wife keeps on contacting her for her medical care.  He is currently taking Voquezna for GERD, followed by GI.  He feels pulling sensation in the throat and epigastric area.  He has had EGD in the past.  He was referred to neurology in the past, but referral was denied as it was thought to be for lumbar radiculopathy at that time.  Past Medical History:  Diagnosis Date   Acute encephalopathy 12/29/2017   Hospitalized 5/22 to 5/23 with acute encephalopathy, unclear etiology   Acute gout involving toe of right foot 09/14/2019   Alcoholism (HCC)    Allergy    Annual visit for general adult medical examination with abnormal findings 06/04/2020   Anxiety    Arthritis    Back problem    Cataract 01/2021   Chest wall pain 03/28/2017   Chronic hepatitis C without hepatic coma (HCC) 01/04/2009   Qualifier: Diagnosis of  By: Ezzard DEVONNA Sonny GORMAN. 2008: CT A/P NO CIRRHOSIS AUG 2013 MRCP- DILATED CBD/NO CIRRHOSIS DEC 2013 AST 99-102  ALT 145-156 PLT 147 HB 13.4 ALB 4.4-4.7 T BIL 0.7    Depression    Dilation of biliary tract 07/14/2012   EUS JAN 2014 BENIGN CBD DILATION    Epigastric pain 07/14/2012   GERD (gastroesophageal reflux disease)    Gout    HCV (hepatitis C virus) 06/16/2015   Hepatitis B    Hepatitis C 1979   Hepatitis C reactive     LIVER BX 2008-CHRONIC ACTIVE HEPATITIS   HTN (hypertension)    Hx of adenomatous colonic polyps 2008   due for surveillance 2017   Hypercholesterolemia    IBS (irritable bowel syndrome)    Iron  deficiency anemia 03/13/2022   Irritable bowel syndrome 01/04/2009   Qualifier: Diagnosis of  By: Ezzard DEVONNA Sonny GORMAN.    Knee pain    Left forearm pain 08/15/2017   Leukopenia 11/06/2015   Lichen planus    Narcotic addiction (HCC) 06/10/2011   [facility]- Oct 2012    Normal cardiac stress test 05/25/2011   [facility] [place]   Normal echocardiogram 05/25/2011   EF 55%, mild TR   Oral lichen planus    Plaque psoriasis    RUQ pain 07/14/2012   Sjogren's disease    Status post dilation of esophageal narrowing    Substance abuse (HCC)    narcotic addiction   Thrombocytopenia 11/06/2015   Trigger middle finger of left hand 06/20/2012    Past Surgical History:  Procedure Laterality Date   24 HOUR PH STUDY  10/21/2020   Procedure: 24 HOUR PH STUDY;  Surgeon: Shila Gustav GAILS, MD;  Location: [facility];  Service: Endoscopy;;   APPENDECTOMY     BALLOON  DILATION N/A 05/28/2020   Procedure: BALLOON DILATION;  Surgeon: Cindie Carlin POUR, DO;  [facility];  Service: Endoscopy;  Laterality: N/A;   BIOPSY  05/28/2020   Procedure: BIOPSY;  Surgeon: Cindie Carlin POUR, DO;  [facility];  Service: Endoscopy;;   BIOPSY  09/07/2022   Procedure: BIOPSY;  Surgeon: Cindie Carlin POUR, DO;  [facility];  Service: Endoscopy;;   CARPAL TUNNEL RELEASE     rt   COLONOSCOPY  2008 [facility] ARS D100 V8 PHEN 12.5   2 SIMPLE ADENOMAS (< 1 CM)   COLONOSCOPY WITH PROPOFOL  N/A 12/05/2019   TI normal, 3 mm polyp in ascending colon, external and internal hemorrhoids. Polyp removed but not retrieved. Poor prep   COLONOSCOPY WITH PROPOFOL  N/A 09/07/2022   Procedure: COLONOSCOPY WITH PROPOFOL ;  Surgeon: Cindie Carlin POUR, DO;  [facility];   Service: Endoscopy;  Laterality: N/A;  9:15 AM   ESOPHAGEAL MANOMETRY N/A 10/21/2020   Procedure: ESOPHAGEAL MANOMETRY (EM);  Surgeon: Shila Gustav GAILS, MD;  Location: [facility];  Service: Endoscopy;  Laterality: N/A;   ESOPHAGOGASTRODUODENOSCOPY  04/26/2012   Dr. Harvey: Non-erosive gastritis (inflammation) was found in the gastric antrum but no H.pylori; multiple biopsies (duodenal bx negative for Celiac)/ The mucosa of the esophagus appeared normal   ESOPHAGOGASTRODUODENOSCOPY  07/28/2021   Reports that his health; normal exam s/p esophageal dilation, esophageal biopsies obtained and were benign.   ESOPHAGOGASTRODUODENOSCOPY (EGD) WITH PROPOFOL  N/A 12/05/2019   mild gastritis, duodenitis due to aspirin /NSAIDs.    ESOPHAGOGASTRODUODENOSCOPY (EGD) WITH PROPOFOL  N/A 05/28/2020   Procedure: ESOPHAGOGASTRODUODENOSCOPY (EGD) WITH PROPOFOL ;  Surgeon: Cindie Carlin POUR, DO;  [facility];  Service: Endoscopy;  Laterality: N/A;  2:30pm   ESOPHAGOGASTRODUODENOSCOPY (EGD) WITH PROPOFOL  N/A 09/07/2022   Procedure: ESOPHAGOGASTRODUODENOSCOPY (EGD) WITH PROPOFOL ;  Surgeon: Cindie Carlin POUR, DO;  [facility];  Service: Endoscopy;  Laterality: N/A;   EUS  09/08/2012   Dr. Teressa: CBD dilated but no stones. Query secondary to Sphincter of Oddi stenosis, ?dysfunction, but clinically without symptoms   FRACTURE SURGERY N/A    [employer]rees[employer] 08/25/2020   HARDWARE REMOVAL Right 02/12/2014   Procedure: HARDWARE REMOVAL;  Surgeon: Alm DELENA Hummer, MD;  Location: [facility];  Service: Orthopedics;  Laterality: Right;   KNEE ARTHROSCOPY     Neurostimulator implant     POLYPECTOMY  12/05/2019   Procedure: POLYPECTOMY;  Surgeon: Harvey Margo CROME, MD;  [facility];  Service: Endoscopy;;   POLYPECTOMY  09/07/2022   Procedure: POLYPECTOMY;  Surgeon: Cindie Carlin POUR, DO;  [facility];  Service: Endoscopy;;   right ring finger     spinal stenosis, had  screws put in neck  04/11/2009   DR KRITZER   SPINE SURGERY     TONSILLECTOMY     ULNA OSTEOTOMY Right 02/12/2014   Procedure: RIGHT ULNAR SHORTENING AND OSTEOTOMY ;  Surgeon: Alm DELENA Hummer, MD;  Location: [facility];  Service: Orthopedics;  Laterality: Right;   ULNAR NERVE TRANSPOSITION     UPPER GASTROINTESTINAL ENDOSCOPY  2008 [facility]F ABD PAIN WEIGHT LOSS d100 v8 phen 12.5   NL   WRIST SURGERY Right 08/2013   Dr. Evalene Chancy    Family History  Problem Relation Age of Onset   Bladder Cancer Mother        rare form    Cancer Sister        Metastatic abdominal   Emotional abuse  Sister    Stroke Sister    Emotional abuse Sister    Heart failure Father    Stroke Father    Alcohol abuse Father    Dementia Paternal Aunt    Alcohol abuse Paternal Uncle    Dementia Paternal Uncle    Dementia Paternal Grandmother    Stroke Paternal Grandmother    ADD / ADHD Cousin    Bipolar disorder Cousin    Anxiety disorder Cousin    Depression Cousin        Committed suicide   Alcohol abuse Cousin    OCD Cousin    Paranoid behavior Cousin    Brain cancer Maternal Grandfather    Heart defect Other        FAMILY HX   Colon cancer Maternal Uncle    Colon cancer Cousin    Colon polyps Neg Hx    Drug abuse Neg Hx    Schizophrenia Neg Hx    Seizures Neg Hx    Sexual abuse Neg Hx    Physical abuse Neg Hx     Social History   Socioeconomic History   Marital status: Divorced    Spouse name: Not on file   Number of children: 1   Years of education: Not on file   Highest education level: Associate degree: occupational, Scientist, product/process development, or vocational program  Occupational History   Occupation: Disabled/retired  Tobacco Use   Smoking status: Former    Current packs/day: 0.00    Average packs/day: 1.5 packs/day for 8.0 years (12.0 ttl pk-yrs)    Types: Cigarettes    Start date: 08/13/1978    Quit date: 08/13/1986    Years since quitting: 37.8    Passive exposure: Current    Smokeless tobacco: Never   Tobacco comments:    quit 20 + yrs ago  Vaping Use   Vaping status: Never Used  Substance and Sexual Activity   Alcohol use: Not Currently    Comment: Used to drink heavily- went to treatment in May 2020. No alcohol in 3 years (03/13/2022)   Drug use: Not Currently    Comment: pain medications, klonopin - none in the last 17 months. Currently in AA   Sexual activity: Not Currently  Other Topics Concern   Not on file  Social History Narrative   Not on file   Social Drivers of Health   Financial Resource Strain: Low Risk  (05/22/2024)   Overall Financial Resource Strain (CARDIA)    Difficulty of Paying Living Expenses: Not hard at all  Food Insecurity: No Food Insecurity (05/22/2024)   Hunger Vital Sign    Worried About Running Out of Food in the Last Year: Never true    Ran Out of Food in the Last Year: Never true  Transportation Needs: No Transportation Needs (05/22/2024)   PRAPARE - Administrator, Civil Service (Medical): No    Lack of Transportation (Non-Medical): No  Physical Activity: Sufficiently Active (05/22/2024)   Exercise Vital Sign    Days of Exercise per Week: 7 days    Minutes of Exercise per Session: 150+ min  Stress: Stress Concern Present (05/22/2024)   [employer] of Occupational Health - Occupational Stress Questionnaire    Feeling of Stress: Rather much  Social Connections: Moderately Isolated (05/22/2024)   Social Connection and Isolation Panel    Frequency of Communication with Friends and Family: More than three times a week    Frequency of Social Gatherings with Friends and Family: More than  three times a week    Attends Religious Services: Never    Active Member of Clubs or Organizations: Yes    Attends Banker Meetings: More than 4 times per year    Marital Status: Divorced  Intimate Partner Violence: Not At Risk (12/09/2023)   Received from [facility]   HITS    Over the last 12 months how  often did your partner physically hurt you?: Never    Over the last 12 months how often did your partner insult you or talk down to you?: Never    Over the last 12 months how often did your partner threaten you with physical harm?: Never    Over the last 12 months how often did your partner scream or curse at you?: Never    Outpatient Medications Prior to Visit  Medication Sig Dispense Refill   AMBULATORY NON FORMULARY MEDICATION Kratom Take 2 tablet by mouth twice daily     AMBULATORY NON FORMULARY MEDICATION Friska caffiene 1-2 tablet twice daily     amLODipine  (NORVASC ) 5 MG tablet Take 1 tablet (5 mg total) by mouth daily. 30 tablet 2   baclofen  (LIORESAL ) 10 MG tablet TAKE (1/2) TABLET (5MG ) BY MOUTH THREE TIMES DAILY BEFORE A MEAL. IF TOLERATED AFTER THREE DAY, YOU CAN INCREASE TO 10MG  BEFORE YOUR LARGEST MEAL AND CONTINUE 5MG  BEFORE TWO OTHER MEALS FOR TOTAL OF 20MG  DAILY 60 tablet 11   cevimeline  (EVOXAC ) 30 MG capsule Take 1 capsule (30 mg total) by mouth 3 (three) times daily. 90 capsule 1   copper  tablet Take 2 tablets (4 mg total) by mouth 2 (two) times daily. 120 tablet 5   famotidine  (PEPCID ) 20 MG tablet Take 1 tablet (20 mg total) by mouth 2 (two) times daily. 180 tablet 3   gabapentin  (NEURONTIN ) 300 MG capsule Take 1 capsule (300 mg total) by mouth 3 (three) times daily.     hydroxychloroquine (PLAQUENIL) 200 MG tablet Take 1 tablet by mouth daily.     hyoscyamine (LEVSIN) 0.125 MG tablet Take by mouth every 4 (four) hours as needed.     lubiprostone  (AMITIZA ) 24 MCG capsule TAKE 1 CAPSULE TWICE DAILY WITH MEALS. 180 capsule 3   OVER THE COUNTER MEDICATION Keto supplement 2 daily     pilocarpine (SALAGEN) 5 MG tablet Take 5 mg by mouth 3 (three) times daily.     senna (SENOKOT) 8.6 MG tablet Take 2 tablets by mouth 2 (two) times daily.     sucralfate  (CARAFATE ) 1 g tablet Take 1 tablet (1 g total) by mouth 4 (four) times daily. 120 tablet 5   Vitamin D , Ergocalciferol ,  (DRISDOL ) 1.25 MG (50000 UNIT) CAPS capsule Take 1 capsule (50,000 Units total) by mouth every 7 (seven) days. 5 capsule 5   Vitamin D3 (VITAMIN D ) 25 MCG tablet Take 4,000 Units by mouth daily.      Vonoprazan Fumarate 10 MG TABS Take 1 tablet by mouth daily.     RABEprazole  (ACIPHEX ) 20 MG tablet Take 20 mg by mouth daily.     ondansetron  (ZOFRAN ) 4 MG tablet Take one tablet by mouth once daily as needed , for nausea 20 tablet 0   predniSONE  (STERAPRED UNI-PAK 21 TAB) 10 MG (21) TBPK tablet Take according to package instructions. 21 tablet 0   No facility-administered medications prior to visit.    Allergies  Allergen Reactions   Levofloxacin Nausea Only and Other (See Comments)    Creepy feeling, skin didn't feel right,  sort of itchy.   Ciprofloxacin     Sweating   Sulfa  Antibiotics     Review of Systems  Constitutional:  Positive for fatigue. Negative for chills and fever.  HENT:  Negative for congestion and sore throat.        Dry mouth  Eyes:  Negative for pain and discharge.  Respiratory:  Negative for cough and shortness of breath.   Cardiovascular:  Negative for chest pain and palpitations.  Gastrointestinal:  Positive for abdominal pain (Epigastric). Negative for diarrhea, nausea and vomiting.  Endocrine: Negative for polydipsia and polyuria.  Genitourinary:  Negative for dysuria and hematuria.  Musculoskeletal:  Negative for neck pain and neck stiffness.  Skin:  Negative for rash.  Neurological:  Negative for dizziness and weakness.  Psychiatric/Behavioral:  Positive for dysphoric mood and sleep disturbance. Negative for agitation and behavioral problems. The patient is nervous/anxious.        Objective:    Physical Exam Vitals reviewed.  Constitutional:      General: He is not in acute distress.    Appearance: He is not diaphoretic.  HENT:     Head: Normocephalic and atraumatic.     Nose: Nose normal.     Mouth/Throat:     Mouth: Mucous membranes are  moist.  Eyes:     General: No scleral icterus.    Extraocular Movements: Extraocular movements intact.  Cardiovascular:     Rate and Rhythm: Normal rate and regular rhythm.     Heart sounds: Normal heart sounds. No murmur heard. Pulmonary:     Breath sounds: Normal breath sounds. No wheezing or rales.  Musculoskeletal:     Cervical back: Neck supple. No tenderness.     Right lower leg: No edema.     Left lower leg: No edema.  Skin:    General: Skin is warm.     Findings: No rash.  Neurological:     General: No focal deficit present.     Mental Status: He is alert and oriented to person, place, and time.  Psychiatric:        Mood and Affect: Mood is depressed.        Behavior: Behavior is cooperative.     BP 134/76 (BP Location: Left Arm)   Pulse 68   Ht 5' 9 (1.753 m)   Wt 138 lb 6.4 oz (62.8 kg)   SpO2 98%   BMI 20.44 kg/m  Wt Readings from Last 3 Encounters:  05/22/24 138 lb 6.4 oz (62.8 kg)  12/07/23 143 lb 0.6 oz (64.9 kg)  11/17/23 137 lb 9.1 oz (62.4 kg)        Assessment & Plan:   Problem List Items Addressed This Visit       Cardiovascular and Mediastinum   Essential hypertension   BP Readings from Last 1 Encounters:  05/22/24 134/76   Was elevated initially today Usually Well-controlled with amlodipine  5 mg QD Counseled for compliance with the medications Advised DASH diet and moderate exercise/walking, at least 150 mins/week        Digestive   Gastroesophageal reflux disease   Uncontrolled Takes Voquezna and sucralfate  Followed by GI      Relevant Medications   Vonoprazan Fumarate 10 MG TABS     Nervous and Auditory   Sjogren syndrome with peripheral nervous system involvement   Has been evaluated by rheumatology, oral surgeon and ENT specialist Has peripheral symptoms in the neck and chest area -unclear if he has neurological manifestations of  Sjogren syndrome or other connective tissue disorder Would try to send referral to neurology  again      Relevant Medications   sertraline (ZOLOFT) 25 MG tablet   Other Relevant Orders   Ambulatory referral to Neurology     Other   MDD (major depressive disorder), recurrent - Primary   Uncontrolled His symptoms are related to his chronic medical conditions Started Zoloft 25 mg QD Brief counseling done today Has tried other SSRIs/SNRIs in the past, including but not limited to Lexapro , Celexa and Wellbutrin Referred to [facility] in [place]      Relevant Medications   sertraline (ZOLOFT) 25 MG tablet   Other Relevant Orders   Ambulatory referral to Psychiatry   Other Visit Diagnoses       Encounter for immunization       Relevant Orders   Flu vaccine HIGH DOSE PF(Fluzone Trivalent) (Completed)        Meds ordered this encounter  Medications   sertraline (ZOLOFT) 25 MG tablet    Sig: Take 1 tablet (25 mg total) by mouth daily.    Dispense:  30 tablet    Refill:  2     Sherisse Fullilove MARLA Blanch, MD

## 2024-05-22 NOTE — Patient Instructions (Addendum)
 Please start taking Zoloft as prescribed.  You are being referred to [facility] clinic in [place].  [address] [place], [place], [place] [address] Phone: [phone]

## 2024-05-23 NOTE — Progress Notes (Deleted)
 [facility] [address], [place] [address]   CLINIC:  Medical Oncology/Hematology  PCP:  Antonetta Rollene BRAVO, MD [address], [address] [place] [address] [phone]    REASON FOR VISIT:  Follow-up for abnormal immunofixation + leukopenia/thrombocytopenia + iron  deficiency anemia   PRIOR THERAPY: None   CURRENT THERAPY: Oral iron  supplementation  INTERVAL HISTORY:   [name] 68 y.o. male returns for routine follow-up of his iron  deficiency anemia, leukopenia, and thrombocytopenia.   He was last seen by Pleasant Barefoot PA-C on 11/17/2023.     At today's visit, he reports feeling ***. ***He continues to report constellation of symptoms related to multiple chronic comorbidities, including chronic pain syndrome (also seeing rheumatology, on Plaquenil for some sort of rheumatoid arthritis.), generalized pruritus, dry/burning mouth, tinnitus, intermittent hearing loss, positional dizziness, and nondiabetic peripheral neuropathy.  He has worsening dysphagia and reflux symptoms, being followed by GI at [facility] as well as local GI.   ***No new bone pain, unexplained fevers, or unintentional weight loss.   ***No new masses or lymphadenopathy.   ***He denies any chest pain, but has some shortness of breath with exertion. ***Energy   He continues to take ibuprofen  once or twice daily despite being advised not to, since it is the only thing that helps with his pain and inflammation.  *** ***He is taking copper  8 mg daily.  (He does not take any zinc). ***Iron  tablets stopped in December 2024.   ***He denies any rectal bleeding, melena, or epistaxis. ***Ice pica  He has 50***% energy and 70***% appetite.  ***He has lost about 5 pounds since his last visit, related to his ongoing GI issues.  ASSESSMENT & PLAN:  1.  Iron  deficiency anemia - Patient has history of elevated ferritin and iron  saturation in the setting of liver disease, but recent labs have  been consistent with iron  deficiency anemia. - Hemoccult stool positive x 3 (September 2023). - EGD (09/07/2022): Gastritis, mild.  No endoscopic evidence of bleeding. - Colonoscopy (09/07/2022): Nonbleeding internal hemorrhoids, melanosis coli, polyps x 3 (tubular adenoma x 1, hyperplastic polyp x 1, polypoid colonic mucosa x 1). - Denies bright red blood per rectum or melena*** - Takes ibuprofen  twice daily*** -Took iron  tablet from August 2023 through December 2024.  (Iron  discontinued due to potential interference with copper  absorption.) - Received IV iron  with Venofer  300 mg x 2 in August 2024 due to symptomatic iron  deficiency, but denies any improvement in symptoms - Admits to chronic fatigue.  Denies pica.*** - Most recent labs (05/17/2024): Hgb 13.1/MCV 90.4.  Ferritin 171, iron  saturation 16%. - PLAN: No additional iron  supplementation needed at this time. - Advised to STOP all NSAID medications*** - Continue GI follow-up - We will repeat CBC and iron  panel with RTC in 6 months***   2.  Copper  deficiency - Nutritional panel (08/12/2022): Copper  deficiency (62), normal B12 642, normal MMA, normal folate. - 24-hour urine copper  (03/30/2023) was within normal limits - Started copper  supplement in January 2024 - current dose of 8 mg daily (since April 2025)  - Stopped taking his zinc-containing multivitamin in July 2024 - Most recent labs (05/17/2024): Copper  remains low at 60 - PLAN: Continue copper  8 mg daily.***  - Recheck copper  levels in 6 months.  3.  Thrombocytopenia and leukopenia, RESOLVED - Previously seen at [facility] from 2017-2018 due to leukopenia and thrombocytopenia in the setting of hepatitis C and fatty liver disease, as well  as elevated ferritin/iron  saturation. - Most recent CBC/D (07/05/2023) shows normal WBC and platelets   4.  Abnormal SPEP/immunofixation - Patient seen at the request of his rheumatologist (Dr. Dolphus) for evaluation of abnormal  immunofixation. - Labs from rheumatology: Immunofixation from 02/05/2022 showed poorly defined area of restricted protein mobility reactive with lambda light chain antisera.   Quantitative immunoglobulins were on the lower limits of normal values.   SPEP negative for M spike - Hematology work-up (03/12/2022) NEGATIVE for any plasma cell dyscrasia SPEP unremarkable. Immunofixation negative. Mildly elevated kappa free light chain 24.2 with normal lambda 12.1 and mildly elevated ratio 2.0. - Most recent MGUS/myeloma panel (07/05/2023):  Immunofixation NEGATIVE SPEP was NEGATIVE for M spike Stable mild elevation in FLC ratio 1.83, with mildly elevated kappa 30.0 and normal lambda 16.4 Normal hemoglobin, creatinine, calcium .  LDH normal.  - Constellation of symptoms (also seeing rheumatology) including chronic pain syndrome, chills, night sweats, tinnitus, intermittent hearing loss, positional dizziness, and nondiabetic peripheral neuropathy.   - No new bone pain, unexplained fevers, or unintentional weight loss. - PLAN: No evidence of monoclonal protein or plasma cell dyscrasia at this point in time.  No further testing planned.   5.  Other history - PMH: Osteoarthritis, chronic pain syndrome, history of hepatitis C and transaminitis (follows with Dr. Cindie at [facility]), GERD, recovering alcoholism in remission (quit drinking 2020), hypertension, prediabetes, history of colon polyps, anxiety/depression, vitamin D  deficiency.  He follows with rheumatology due to positive ANA, sicca symptoms, constitutional symptoms, and arthritic pain (recently referred to [facility] for second opinion). - SOCIAL:  Lives alone and is functionally independent.  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 - SUBSTANCE: Past history of substance abuse, but has been sober for the past 3 years.  He reports prior history of daily heavy alcohol use on and off for about 45 years.  He  reports previous heavy drug use including IV heroin, cocaine, MDMA, and opioid/benzodiazepine pills.  He worked very hard via Alcoholics Anonymous over the past 3 years. - FAMILY: No family history of multiple myeloma.  Mother deceased secondary to urachal bladder cancer.  Sister deceased secondary to metastatic stomach cancer.  Maternal grandmother with unspecified cancer.  Maternal grandfather with brain cancer.   PLAN SUMMARY:*** >> Labs in 6 months = CBC/D, ferritin, iron /TIBC, copper  >> PHONE visit in 6 months (1 week after labs).       REVIEW OF SYSTEMS: ***  Review of Systems  Constitutional:  Positive for fatigue. Negative for appetite change, chills, diaphoresis, fever and unexpected weight change.  HENT:   Positive for tinnitus and trouble swallowing (dry mouth and burning tongue). Negative for lump/mass and nosebleeds.   Eyes:  Negative for eye problems.  Respiratory:  Positive for cough and shortness of breath. Negative for hemoptysis.   Cardiovascular:  Negative for chest pain, leg swelling and palpitations.  Gastrointestinal:  Positive for constipation and nausea. Negative for abdominal pain, blood in stool, diarrhea and vomiting.  Genitourinary:  Negative for hematuria.   Musculoskeletal:  Positive for arthralgias and back pain.  Skin: Negative.   Neurological:  Positive for dizziness and numbness. Negative for headaches and light-headedness.  Hematological:  Does not bruise/bleed easily.  Psychiatric/Behavioral:  Negative for depression and sleep disturbance. The patient is not nervous/anxious.     PHYSICAL EXAM:***  ECOG PERFORMANCE STATUS: 1 - Symptomatic but completely ambulatory  There were no vitals filed for this visit.   There were no  vitals filed for this visit.  Physical Exam Constitutional:      Appearance: Normal appearance. He is normal weight.  Cardiovascular:     Heart sounds: Normal heart sounds.  Pulmonary:     Breath sounds: Normal breath sounds.   Neurological:     General: No focal deficit present.     Mental Status: Mental status is at baseline.  Psychiatric:        Behavior: Behavior normal. Behavior is cooperative.    PAST MEDICAL/SURGICAL HISTORY:  Past Medical History:  Diagnosis Date   Acute encephalopathy 12/29/2017   Hospitalized 5/22 to 5/23 with acute encephalopathy, unclear etiology   Acute gout involving toe of right foot 09/14/2019   Alcoholism (HCC)    Allergy    Annual visit for general adult medical examination with abnormal findings 06/04/2020   Anxiety    Arthritis    Back problem    Cataract 01/2021   Chest wall pain 03/28/2017   Chronic hepatitis C without hepatic coma (HCC) 01/04/2009   Qualifier: Diagnosis of  By: Ezzard DEVONNA Sonny GORMAN. 2008: CT A/P NO CIRRHOSIS AUG 2013 MRCP- DILATED CBD/NO CIRRHOSIS DEC 2013 AST 99-102  ALT 145-156 PLT 147 HB 13.4 ALB 4.4-4.7 T BIL 0.7    Depression    Dilation of biliary tract 07/14/2012   EUS JAN 2014 BENIGN CBD DILATION    Epigastric pain 07/14/2012   GERD (gastroesophageal reflux disease)    Gout    HCV (hepatitis C virus) 06/16/2015   Hepatitis B    Hepatitis C 1979   Hepatitis C reactive    LIVER BX 2008-CHRONIC ACTIVE HEPATITIS   HTN (hypertension)    Hx of adenomatous colonic polyps 2008   due for surveillance 2017   Hypercholesterolemia    IBS (irritable bowel syndrome)    Iron  deficiency anemia 03/13/2022   Irritable bowel syndrome 01/04/2009   Qualifier: Diagnosis of  By: Ezzard DEVONNA Sonny GORMAN.    Knee pain    Left forearm pain 08/15/2017   Leukopenia 11/06/2015   Lichen planus    Narcotic addiction (HCC) 06/10/2011   [facility]- Oct 2012    Normal cardiac stress test 05/25/2011   [facility] [place]   Normal echocardiogram 05/25/2011   EF 55%, mild TR   Oral lichen planus    Plaque psoriasis    RUQ pain 07/14/2012   Sjogren's disease    Status post dilation of esophageal narrowing    Substance abuse (HCC)     narcotic addiction   Thrombocytopenia 11/06/2015   Trigger middle finger of left hand 06/20/2012   Past Surgical History:  Procedure Laterality Date   24 HOUR PH STUDY  10/21/2020   Procedure: 24 HOUR PH STUDY;  Surgeon: Shila Gustav GAILS, MD;  Location: [facility];  Service: Endoscopy;;   APPENDECTOMY     BALLOON DILATION N/A 05/28/2020   Procedure: MERRILL HODGKIN;  Surgeon: Cindie Carlin POUR, DO;  Location: [facility];  Service: Endoscopy;  Laterality: N/A;   BIOPSY  05/28/2020   Procedure: BIOPSY;  Surgeon: Cindie Carlin POUR, DO;  [facility]: [facility];  Service: Endoscopy;;   BIOPSY  09/07/2022   Procedure: BIOPSY;  Surgeon: Cindie Carlin POUR, DO;  [facility]: [facility];  Service: Endoscopy;;   CARPAL TUNNEL RELEASE     rt   COLONOSCOPY  2008 [facility] ARS D100 V8 PHEN 12.5   2 SIMPLE ADENOMAS (< 1 CM)   COLONOSCOPY WITH PROPOFOL  N/A 12/05/2019  TI normal, 3 mm polyp in ascending colon, external and internal hemorrhoids. Polyp removed but not retrieved. Poor prep   COLONOSCOPY WITH PROPOFOL  N/A 09/07/2022   Procedure: COLONOSCOPY WITH PROPOFOL ;  Surgeon: Cindie Carlin POUR, DO;  [facility]: [facility];  Service: Endoscopy;  Laterality: N/A;  9:15 AM   ESOPHAGEAL MANOMETRY N/A 10/21/2020   Procedure: ESOPHAGEAL MANOMETRY (EM);  Surgeon: Shila Gustav GAILS, MD;  Location: [facility];  Service: Endoscopy;  Laterality: N/A;   ESOPHAGOGASTRODUODENOSCOPY  04/26/2012   Dr. Harvey: Non-erosive gastritis (inflammation) was found in the gastric antrum but no H.pylori; multiple biopsies (duodenal bx negative for Celiac)/ The mucosa of the esophagus appeared normal   ESOPHAGOGASTRODUODENOSCOPY  07/28/2021   Reports that his health; normal exam s/p esophageal dilation, esophageal biopsies obtained and were benign.   ESOPHAGOGASTRODUODENOSCOPY (EGD) WITH PROPOFOL  N/A 12/05/2019   mild gastritis, duodenitis due to aspirin /NSAIDs.    ESOPHAGOGASTRODUODENOSCOPY (EGD) WITH PROPOFOL  N/A  05/28/2020   Procedure: ESOPHAGOGASTRODUODENOSCOPY (EGD) WITH PROPOFOL ;  Surgeon: Cindie Carlin POUR, DO;  [facility];  Service: Endoscopy;  Laterality: N/A;  2:30pm   ESOPHAGOGASTRODUODENOSCOPY (EGD) WITH PROPOFOL  N/A 09/07/2022   Procedure: ESOPHAGOGASTRODUODENOSCOPY (EGD) WITH PROPOFOL ;  Surgeon: Cindie Carlin POUR, DO;  [facility];  Service: Endoscopy;  Laterality: N/A;   EUS  09/08/2012   Dr. Teressa: CBD dilated but no stones. Query secondary to Sphincter of Oddi stenosis, ?dysfunction, but clinically without symptoms   FRACTURE SURGERY N/A    Phreesia 08/25/2020   HARDWARE REMOVAL Right 02/12/2014   Procedure: HARDWARE REMOVAL;  Surgeon: Alm DELENA Hummer, MD;  Location: [facility];  Service: Orthopedics;  Laterality: Right;   KNEE ARTHROSCOPY     Neurostimulator implant     POLYPECTOMY  12/05/2019   Procedure: POLYPECTOMY;  Surgeon: Harvey Margo CROME, MD;  [facility];  Service: Endoscopy;;   POLYPECTOMY  09/07/2022   Procedure: POLYPECTOMY;  Surgeon: Cindie Carlin POUR, DO;  [facility];  Service: Endoscopy;;   right ring finger     spinal stenosis, had screws put in neck  04/11/2009   DR KRITZER   SPINE SURGERY     TONSILLECTOMY     ULNA OSTEOTOMY Right 02/12/2014   Procedure: RIGHT ULNAR SHORTENING AND OSTEOTOMY ;  Surgeon: Alm DELENA Hummer, MD;  Location: [facility];  Service: Orthopedics;  Laterality: Right;   ULNAR NERVE TRANSPOSITION     UPPER GASTROINTESTINAL ENDOSCOPY  2008 SLF ABD PAIN WEIGHT LOSS d100 v8 phen 12.5   NL   WRIST SURGERY Right 08/2013   Dr. Evalene Chancy    SOCIAL HISTORY:  Social History   Socioeconomic History   Marital status: Divorced    Spouse name: Not on file   Number of children: 1   Years of education: Not on file   Highest education level: Associate degree: occupational, Scientist, product/process development, or vocational program  Occupational History   Occupation: Disabled/retired   Tobacco Use   Smoking status: Former    Current packs/day: 0.00    Average packs/day: 1.5 packs/day for 8.0 years (12.0 ttl pk-yrs)    Types: Cigarettes    Start date: 08/13/1978    Quit date: 08/13/1986    Years since quitting: 37.8    Passive exposure: Current   Smokeless tobacco: Never   Tobacco comments:    quit 20 + yrs ago  Vaping Use   Vaping status: Never Used  Substance and Sexual Activity   Alcohol use: Not Currently  Comment: Used to drink heavily- went to treatment in May 2020. No alcohol in 3 years (03/13/2022)   Drug use: Not Currently    Comment: pain medications, klonopin - none in the last 17 months. Currently in AA   Sexual activity: Not Currently  Other Topics Concern   Not on file  Social History Narrative   Not on file   Social Drivers of Health   Financial Resource Strain: Low Risk  (05/22/2024)   Overall Financial Resource Strain (CARDIA)    Difficulty of Paying Living Expenses: Not hard at all  Food Insecurity: No Food Insecurity (05/22/2024)   Hunger Vital Sign    Worried About Running Out of Food in the Last Year: Never true    Ran Out of Food in the Last Year: Never true  Transportation Needs: No Transportation Needs (05/22/2024)   PRAPARE - Administrator, Civil Service (Medical): No    Lack of Transportation (Non-Medical): No  Physical Activity: Sufficiently Active (05/22/2024)   Exercise Vital Sign    Days of Exercise per Week: 7 days    Minutes of Exercise per Session: 150+ min  Stress: Stress Concern Present (05/22/2024)   [employer] of Occupational Health - Occupational Stress Questionnaire    Feeling of Stress: Rather much  Social Connections: Moderately Isolated (05/22/2024)   Social Connection and Isolation Panel    Frequency of Communication with Friends and Family: More than three times a week    Frequency of Social Gatherings with Friends and Family: More than three times a week    Attends Religious Services: Never     Database administrator or Organizations: Yes    Attends Engineer, structural: More than 4 times per year    Marital Status: Divorced  Catering manager Violence: Not At Risk (12/09/2023)   Received from [facility]   HITS    Over the last 12 months how often did your partner physically hurt you?: Never    Over the last 12 months how often did your partner insult you or talk down to you?: Never    Over the last 12 months how often did your partner threaten you with physical harm?: Never    Over the last 12 months how often did your partner scream or curse at you?: Never    FAMILY HISTORY:  Family History  Problem Relation Age of Onset   Bladder Cancer Mother        rare form    Cancer Sister        Metastatic abdominal   Emotional abuse Sister    Stroke Sister    Emotional abuse Sister    Heart failure Father    Stroke Father    Alcohol abuse Father    Dementia Paternal Aunt    Alcohol abuse Paternal Uncle    Dementia Paternal Uncle    Dementia Paternal Grandmother    Stroke Paternal Grandmother    ADD / ADHD Cousin    Bipolar disorder Cousin    Anxiety disorder Cousin    Depression Cousin        Committed suicide   Alcohol abuse Cousin    OCD Cousin    Paranoid behavior Cousin    Brain cancer Maternal Grandfather    Heart defect Other        FAMILY HX   Colon cancer Maternal Uncle    Colon cancer Cousin    Colon polyps Neg Hx    Drug abuse Neg  Hx    Schizophrenia Neg Hx    Seizures Neg Hx    Sexual abuse Neg Hx    Physical abuse Neg Hx     CURRENT MEDICATIONS:  Outpatient Encounter Medications as of 05/24/2024  Medication Sig Note   AMBULATORY NON FORMULARY MEDICATION Kratom Take 2 tablet by mouth twice daily    AMBULATORY NON FORMULARY MEDICATION Friska caffiene 1-2 tablet twice daily    amLODipine  (NORVASC ) 5 MG tablet Take 1 tablet (5 mg total) by mouth daily.    baclofen  (LIORESAL ) 10 MG tablet TAKE (1/2) TABLET (5MG ) BY MOUTH THREE TIMES  DAILY BEFORE A MEAL. IF TOLERATED AFTER THREE DAY, YOU CAN INCREASE TO 10MG  BEFORE YOUR LARGEST MEAL AND CONTINUE 5MG  BEFORE TWO OTHER MEALS FOR TOTAL OF 20MG  DAILY 08/18/2023: One bid    cevimeline  (EVOXAC ) 30 MG capsule Take 1 capsule (30 mg total) by mouth 3 (three) times daily.    copper  tablet Take 2 tablets (4 mg total) by mouth 2 (two) times daily.    famotidine  (PEPCID ) 20 MG tablet Take 1 tablet (20 mg total) by mouth 2 (two) times daily.    gabapentin  (NEURONTIN ) 300 MG capsule Take 1 capsule (300 mg total) by mouth 3 (three) times daily.    hydroxychloroquine (PLAQUENIL) 200 MG tablet Take 1 tablet by mouth daily.    hyoscyamine (LEVSIN) 0.125 MG tablet Take by mouth every 4 (four) hours as needed.    lubiprostone  (AMITIZA ) 24 MCG capsule TAKE 1 CAPSULE TWICE DAILY WITH MEALS.    OVER THE COUNTER MEDICATION Keto supplement 2 daily    pilocarpine (SALAGEN) 5 MG tablet Take 5 mg by mouth 3 (three) times daily. 08/18/2023: 4 per day    senna (SENOKOT) 8.6 MG tablet Take 2 tablets by mouth 2 (two) times daily.    sertraline (ZOLOFT) 25 MG tablet Take 1 tablet (25 mg total) by mouth daily.    sucralfate  (CARAFATE ) 1 g tablet Take 1 tablet (1 g total) by mouth 4 (four) times daily.    Vitamin D , Ergocalciferol , (DRISDOL ) 1.25 MG (50000 UNIT) CAPS capsule Take 1 capsule (50,000 Units total) by mouth every 7 (seven) days.    Vitamin D3 (VITAMIN D ) 25 MCG tablet Take 4,000 Units by mouth daily.     Vonoprazan Fumarate 10 MG TABS Take 1 tablet by mouth daily.    No facility-administered encounter medications on file as of 05/24/2024.    ALLERGIES:  Allergies  Allergen Reactions   Levofloxacin Nausea Only and Other (See Comments)    Creepy feeling, skin didn't feel right, sort of itchy.   Ciprofloxacin     Sweating   Sulfa  Antibiotics     LABORATORY DATA:  I have reviewed the labs as listed.  CBC    Component Value Date/Time   WBC 6.8 05/17/2024 0919   RBC 4.38 05/17/2024 0919    HGB 13.1 05/17/2024 0919   HGB 13.2 12/07/2023 1038   HCT 39.6 05/17/2024 0919   HCT 39.1 12/07/2023 1038   PLT 215 05/17/2024 0919   PLT 192 12/07/2023 1038   MCV 90.4 05/17/2024 0919   MCV 90 12/07/2023 1038   MCH 29.9 05/17/2024 0919   MCHC 33.1 05/17/2024 0919   RDW 13.6 05/17/2024 0919   RDW 12.6 12/07/2023 1038   LYMPHSABS 1.4 05/17/2024 0919   LYMPHSABS 1.2 12/07/2023 1038   MONOABS 0.5 05/17/2024 0919   EOSABS 0.1 05/17/2024 0919   EOSABS 0.2 12/07/2023 1038   BASOSABS 0.0 05/17/2024 0919  BASOSABS 0.0 12/07/2023 1038      Latest Ref Rng & Units 12/07/2023   10:38 AM 11/10/2023    9:18 AM 07/05/2023   10:49 AM  CMP  Glucose 70 - 99 mg/dL 885  97  891   BUN 8 - 27 mg/dL 14  13  18    Creatinine 0.76 - 1.27 mg/dL 9.16  9.25  9.20   Sodium 134 - 144 mmol/L 143  139  139   Potassium 3.5 - 5.2 mmol/L 4.4  3.8  3.6   Chloride 96 - 106 mmol/L 105  103  106   CO2 20 - 29 mmol/L 26  28  25    Calcium  8.6 - 10.2 mg/dL 9.5  9.3  9.1   Total Protein 6.0 - 8.5 g/dL 6.3  7.4  7.4   Total Bilirubin 0.0 - 1.2 mg/dL 0.3  0.5  0.5   Alkaline Phos 44 - 121 IU/L 125  82  76   AST 0 - 40 IU/L 18  17  29    ALT 0 - 44 IU/L 15  20  31      DIAGNOSTIC IMAGING:  I have independently reviewed the relevant imaging and discussed with the patient.   WRAP UP:  All questions were answered. The patient knows to call the clinic with any problems, questions or concerns.  Medical decision making: Moderate***  Time spent on visit: I spent 20 minutes counseling the patient face to face. The total time spent in the appointment was 30 minutes and more than 50% was on counseling.  Pleasant CHRISTELLA Barefoot, PA-C  ***

## 2024-05-24 ENCOUNTER — Inpatient Hospital Stay: Admitting: Physician Assistant

## 2024-05-24 NOTE — Assessment & Plan Note (Addendum)
 Uncontrolled His symptoms are related to his chronic medical conditions Started Zoloft 25 mg QD Brief counseling done today Has tried other SSRIs/SNRIs in the past, including but not limited to Lexapro , Celexa and Wellbutrin Referred to [facility] in [place]

## 2024-05-24 NOTE — Addendum Note (Signed)
 Addended byBETHA TOBIE DOWNS on: 05/24/2024 01:05 PM   Modules accepted: Level of Service

## 2024-05-24 NOTE — Assessment & Plan Note (Signed)
 Uncontrolled Takes Voquezna and sucralfate  Followed by GI

## 2024-05-24 NOTE — Assessment & Plan Note (Signed)
 Has been evaluated by rheumatology, oral surgeon and ENT specialist Has peripheral symptoms in the neck and chest area -unclear if he has neurological manifestations of Sjogren syndrome or other connective tissue disorder Would try to send referral to neurology again

## 2024-05-24 NOTE — Assessment & Plan Note (Signed)
 BP Readings from Last 1 Encounters:  05/22/24 134/76   Was elevated initially today Usually Well-controlled with amlodipine  5 mg QD Counseled for compliance with the medications Advised DASH diet and moderate exercise/walking, at least 150 mins/week

## 2024-05-27 ENCOUNTER — Other Ambulatory Visit: Payer: Self-pay | Admitting: Gastroenterology

## 2024-05-27 DIAGNOSIS — K5900 Constipation, unspecified: Secondary | ICD-10-CM

## 2024-05-27 NOTE — Progress Notes (Unsigned)
 VIRTUAL VISIT via TELEPHONE NOTE [facility]   I connected with Marc [name]  on 05/29/24 at  11:20 AM by telephone and verified that I am speaking with the correct person using two identifiers.  Location: Patient: Home Provider: [facility]   I discussed the limitations, risks, security and privacy concerns of performing an evaluation and management service by telephone and the availability of in person appointments. I also discussed with the patient that there may be a patient responsible charge related to this service. The patient expressed understanding and agreed to proceed.   REASON FOR VISIT:  Follow-up for abnormal immunofixation + leukopenia/thrombocytopenia + iron  deficiency anemia   PRIOR THERAPY: None   CURRENT THERAPY: Oral iron  supplementation  INTERVAL HISTORY:   Marc [name] 68 y.o. male returns for routine follow-up of his iron  deficiency anemia, leukopenia, and thrombocytopenia.   He was last seen by Pleasant Barefoot PA-C on 11/17/2023.     At today's visit, he reports feeling somewhat poorly in the setting of chronic comorbidities and increased depression. At today's visit, he does report some increased depression, which he describes as feeling hopeless from his long-term GI symptoms, as well as some interpersonal relationship stressors, as he has been trying to support his ex-wife after her new diagnosis of dementia.  He reports that he attends AA meetings 6 days weekly, and is finding good support from his AA group.  He has discussed his depression with his PCP, and has an upcoming appointment at [facility] in [place].  He denies any thoughts or intention of self-harm today.  He is aware to reach out for immediate medical attention if he has any active thoughts of self-harm.  He continues to report constellation of symptoms related to multiple chronic comorbidities, including chronic pain syndrome (also seeing rheumatology, on Plaquenil  for some sort of rheumatoid arthritis.), generalized pruritus, dry/burning mouth, tinnitus, intermittent hearing loss, positional dizziness, and nondiabetic peripheral neuropathy.  He has worsening dysphagia and reflux symptoms, being followed by GI at [facility] as well as local GI.   No new bone pain, unexplained fevers, or unintentional weight loss.   No new masses or lymphadenopathy.   He denies any chest pain, but has some shortness of breath with exertion. He reports some fatigue related to chronic health conditions. Energy   He continues to take ibuprofen  once or twice daily despite being advised not to, since it is the only thing that helps with his pain and inflammation.   He is taking copper  8 mg daily.  (He does not take any zinc). Iron  tablets stopped in December 2024 due to potential interaction with copper . He denies any rectal bleeding, melena, or epistaxis. No ice pica.  He has 50% energy and 50% appetite.  He has lost a few pounds since his last visit, related to his ongoing GI issues.  ASSESSMENT & PLAN:  1.  Iron  deficiency anemia - Patient has history of elevated ferritin and iron  saturation in the setting of liver disease, but recent labs have been consistent with iron  deficiency anemia. - Hemoccult stool positive x 3 (September 2023). - EGD (09/07/2022): Gastritis, mild.  No endoscopic evidence of bleeding. - Colonoscopy (09/07/2022): Nonbleeding internal hemorrhoids, melanosis coli, polyps x 3 (tubular adenoma x 1, hyperplastic polyp x 1, polypoid colonic mucosa x 1). - Denies bright red blood per rectum or melena - Takes ibuprofen  twice daily -Took iron  tablet from August 2023 through December 2024.  (Iron  discontinued due  to potential interference with copper  absorption.) - Received IV iron  with Venofer  300 mg x 2 in August 2024 due to symptomatic iron  deficiency, but denies any improvement in symptoms - Admits to chronic fatigue.  Denies pica. - Most recent labs  (05/17/2024): Hgb 13.1/MCV 90.4.  Ferritin 171, iron  saturation 16%. - PLAN: No additional iron  supplementation needed at this time. - Advised to STOP all NSAID medications - Continue GI follow-up - We will repeat CBC and iron  panel with RTC in 6 months   2.  Copper  deficiency - Nutritional panel (08/12/2022): Copper  deficiency (62), normal B12 642, normal MMA, normal folate. - 24-hour urine copper  (03/30/2023) was within normal limits - Started copper  supplement in January 2024 - current dose of 8 mg daily (since April 2025)  - Stopped taking his zinc-containing multivitamin in July 2024 - Most recent labs (05/17/2024): Copper  remains low at 60 - PLAN: Continue copper  8 mg daily.  - Recheck copper  levels in 6 months.  3.  Thrombocytopenia and leukopenia, RESOLVED - Previously seen at [facility] from 2017-2018 due to leukopenia and thrombocytopenia in the setting of hepatitis C and fatty liver disease, as well as elevated ferritin/iron  saturation. - Most recent CBC/D (05/17/2024) shows normal WBC and platelets   4.  Abnormal SPEP/immunofixation - Patient seen at the request of his rheumatologist (Dr. Dolphus) for evaluation of abnormal immunofixation. - Labs from rheumatology: Immunofixation from 02/05/2022 showed poorly defined area of restricted protein mobility reactive with lambda light chain antisera.   Quantitative immunoglobulins were on the lower limits of normal values.   SPEP negative for M spike - Hematology work-up (03/12/2022) NEGATIVE for any plasma cell dyscrasia SPEP unremarkable. Immunofixation negative. Mildly elevated kappa free light chain 24.2 with normal lambda 12.1 and mildly elevated ratio 2.0. - Most recent MGUS/myeloma panel (07/05/2023):  Immunofixation NEGATIVE SPEP was NEGATIVE for M spike Stable mild elevation in FLC ratio 1.83, with mildly elevated kappa 30.0 and normal lambda 16.4 Normal hemoglobin, creatinine, calcium .  LDH normal.  -  Constellation of symptoms (also seeing rheumatology) including chronic pain syndrome, chills, night sweats, tinnitus, intermittent hearing loss, positional dizziness, and nondiabetic peripheral neuropathy.   - No new bone pain, unexplained fevers, or unintentional weight loss. - PLAN: No evidence of monoclonal protein or plasma cell dyscrasia at this point in time.  No further testing planned.   5.  Depression - Increased depression as described in HPI - Does not feel that he is an immediate threat to himself.   - PLAN: Continue follow-up with PCP and therapist.  Advised to call 911 or proceed to ED if any active thoughts of self-harm.  6.  Other history - PMH: Osteoarthritis, chronic pain syndrome, history of hepatitis C and transaminitis (follows with Dr. Cindie at [facility]), GERD, recovering alcoholism in remission (quit drinking 2020), hypertension, prediabetes, history of colon polyps, anxiety/depression, vitamin D  deficiency.  He follows with rheumatology due to positive ANA, sicca symptoms, constitutional symptoms, and arthritic pain (recently referred to [facility] for second opinion). - SOCIAL:  Lives alone and is functionally independent.  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 - SUBSTANCE: Past history of substance abuse, but has been sober for the past 3 years.  He reports prior history of daily heavy alcohol use on and off for about 45 years.  He reports previous heavy drug use including IV heroin, cocaine, MDMA, and opioid/benzodiazepine pills.  He worked very hard via Alcoholics Anonymous over the  past 3 years. - FAMILY: No family history of multiple myeloma.  Mother deceased secondary to urachal bladder cancer.  Sister deceased secondary to metastatic stomach cancer.  Maternal grandmother with unspecified cancer.  Maternal grandfather with brain cancer.   PLAN SUMMARY: >> Labs in 6 months = CBC/D, ferritin, iron /TIBC, copper  >> OFFICE visit  in 6 months (1 week after labs).       REVIEW OF SYSTEMS:   Review of Systems  Constitutional:  Positive for malaise/fatigue. Negative for chills, diaphoresis, fever and weight loss.  Respiratory:  Positive for shortness of breath (with exertion).   Cardiovascular:  Negative for chest pain and palpitations.  Gastrointestinal:  Positive for constipation. Negative for abdominal pain, blood in stool, melena, nausea and vomiting.  Musculoskeletal:  Positive for joint pain and myalgias.  Neurological:  Positive for tingling, sensory change and headaches. Negative for dizziness.  Psychiatric/Behavioral:  Positive for depression. Negative for substance abuse and suicidal ideas.      PHYSICAL EXAM: (per limitations of virtual telephone visit)  The patient is alert and oriented x 3, exhibiting adequate mentation, good mood, and ability to speak in full sentences and execute sound judgement.  WRAP UP:   I discussed the assessment and treatment plan with the patient. The patient was provided an opportunity to ask questions and all were answered. The patient agreed with the plan and demonstrated an understanding of the instructions.   The patient was advised to call back or seek an in-person evaluation if the symptoms worsen or if the condition fails to improve as anticipated.  I provided 32 minutes of non-face-to-face time during this encounter, including > 10 minutes of medical discussion.  PASCUAL MANTEL, PA-C 05/29/24 11:59 AM

## 2024-05-29 ENCOUNTER — Inpatient Hospital Stay: Admitting: Physician Assistant

## 2024-05-29 DIAGNOSIS — D500 Iron deficiency anemia secondary to blood loss (chronic): Secondary | ICD-10-CM | POA: Diagnosis not present

## 2024-05-29 DIAGNOSIS — Z1331 Encounter for screening for depression: Secondary | ICD-10-CM

## 2024-05-29 DIAGNOSIS — E610 Copper deficiency: Secondary | ICD-10-CM

## 2024-05-29 NOTE — Progress Notes (Signed)
 When I talked with patient today to get him ready for his visit with Rebekah, he scored 15 on the PHQ9.  He has scored 12 earlier last week.  He reports being a very distressed, anxious, restless person.  He doesn't feel that he would hurt himself today, but could hurt himself at times.  I offered social work consult and he agreed and also stated that he has appt coming up at [facility] in [place].  He was referred by his PCP.  After our conversation, I felt good about hanging up with him and didn't feel that he was in immediate crisis at this time.  I did let him know that if he does get those immediate ideas and wants to reach out for help, he can call 911 or go to his nearest ER.  He appreciates the information and states he will wait for his appt with the therapist in [place]. I have let provider and our social worker know what is going on and they will follow up with the patient.

## 2024-05-31 ENCOUNTER — Inpatient Hospital Stay: Admitting: Licensed Clinical Social Worker

## 2024-05-31 DIAGNOSIS — Z1331 Encounter for screening for depression: Secondary | ICD-10-CM

## 2024-05-31 NOTE — Progress Notes (Signed)
 [facility] CSW Progress Note  Clinical Child psychotherapist contacted patient by phone to follow-up on emotional support.    Interventions: Pt reports today as an okay day.  Per pt he has been struggling with the symptoms of Sjogren's syndrome and after seeing multiple doctors over the past couple of years feels as though he has no way to find relief from his symptoms and the doctors offer no solutions.  Pt is unable to engage in activities he previously enjoyed due to the symptoms and is struggling to find purpose.  CSW discussed possibly looking into online support groups for Sjogren's syndrome as other people who are suffering from similar symptoms may have found some solutions they can share.  Pt states he has tried to do that through [facility], but has not found any of them helpful.  Pt was referred to [facility] by Dr. Tobie for counseling and was started on Zoloft.  Pt started taking the Zoloft a couple of days ago.  CSW to follow up w/ [facility] to ensure they received the referral as pt has not yet heard from them.  CSW to research supportive services for Sjogren's syndrome.  Pt provided w/ contact details for CSW for additional support as needed.        Follow Up Plan:  Patient will contact CSW with any support or resource needs    Devere JONELLE Manna, LCSW Clinical Social Worker [facility]

## 2024-06-01 ENCOUNTER — Telehealth: Payer: Self-pay

## 2024-06-01 NOTE — Telephone Encounter (Signed)
 Pt stopped by the office wanting to know why his refill requests for Lubiprostone  keep getting denied.

## 2024-06-01 NOTE — Telephone Encounter (Signed)
 Did Marc [name] not receive the one year supply I sent 05/16/2024? It said confirmed receipt on our end. Check behind me to make sure I did not make a mistake.  I keep denying them because I already sent one year supply.

## 2024-06-01 NOTE — Telephone Encounter (Signed)
 Pt was made aware and verbalized understanding. Pt wishes to use [employer] moving forward.

## 2024-06-05 ENCOUNTER — Encounter: Payer: Self-pay | Admitting: Podiatry

## 2024-06-05 ENCOUNTER — Inpatient Hospital Stay: Admitting: Licensed Clinical Social Worker

## 2024-06-05 ENCOUNTER — Ambulatory Visit (INDEPENDENT_AMBULATORY_CARE_PROVIDER_SITE_OTHER)

## 2024-06-05 ENCOUNTER — Ambulatory Visit: Admitting: Podiatry

## 2024-06-05 VITALS — Ht 69.0 in | Wt 138.4 lb

## 2024-06-05 DIAGNOSIS — M7752 Other enthesopathy of left foot: Secondary | ICD-10-CM

## 2024-06-05 DIAGNOSIS — M7751 Other enthesopathy of right foot: Secondary | ICD-10-CM

## 2024-06-05 DIAGNOSIS — M216X9 Other acquired deformities of unspecified foot: Secondary | ICD-10-CM

## 2024-06-05 DIAGNOSIS — Z1331 Encounter for screening for depression: Secondary | ICD-10-CM

## 2024-06-05 NOTE — Progress Notes (Signed)
 Orthotics   Patient was present and evaluated for Custom molded foot orthotics. Patient will benefit from CFO's to provide total contact to BIL MLA's helping to balance and distribute body weight more evenly across BIL feet helping to reduce plantar pressure and pain. Orthotic will also encourage FF / RF alignment  Patient was scanned today and will return for fitting upon receipt  Not covered

## 2024-06-05 NOTE — Progress Notes (Signed)
 [facility] CSW Progress Note  Clinical Child Psychotherapist contacted patient by phone to follow-up on emotional support.    Interventions: CSW spoke w/ a representative at [facility] who informed CSW that they do not see clients over the age of 29 and suggested [facility].  [facility] has a 3 month wait list, but suggested [facility] for medication management.  CSW informed pt of the above, suggesting pt get on the wait list at [facility] and his PCP could manage his Zoloft until he can be seen there.  CSW informed of availability for counseling to bridge if he does choose the wait list.  CSW also emailed pt a link for a website w/ group and mentor support for Sjogren's Disease.       Follow Up Plan:  Patient will contact CSW with any support or resource needs    Devere JONELLE Manna, LCSW Clinical Social Worker [facility]

## 2024-06-05 NOTE — Progress Notes (Signed)
 Chief Complaint  Patient presents with   Foot Pain    Pt is here due to bilateral foot pain, he states that his feet always hurts, and are sore, starting to effect his gait, seems to be off balanced, also has a callous to the right foot near the great toe and corn on the left pinky toe.    HPI: 68 y.o. male presenting today as a new patient for above complaint  Past Medical History:  Diagnosis Date   Acute encephalopathy 12/29/2017   Hospitalized 5/22 to 5/23 with acute encephalopathy, unclear etiology   Acute gout involving toe of right foot 09/14/2019   Alcoholism (HCC)    Allergy    Annual visit for general adult medical examination with abnormal findings 06/04/2020   Anxiety    Arthritis    Back problem    Cataract 01/2021   Chest wall pain 03/28/2017   Chronic hepatitis C without hepatic coma (HCC) 01/04/2009   Qualifier: Diagnosis of  By: Ezzard DEVONNA Sonny GORMAN. 2008: CT A/P NO CIRRHOSIS AUG 2013 MRCP- DILATED CBD/NO CIRRHOSIS DEC 2013 AST 99-102  ALT 145-156 PLT 147 HB 13.4 ALB 4.4-4.7 T BIL 0.7    Depression    Dilation of biliary tract 07/14/2012   EUS JAN 2014 BENIGN CBD DILATION    Epigastric pain 07/14/2012   GERD (gastroesophageal reflux disease)    Gout    HCV (hepatitis C virus) 06/16/2015   Hepatitis B    Hepatitis C 1979   Hepatitis C reactive    LIVER BX 2008-CHRONIC ACTIVE HEPATITIS   HTN (hypertension)    Hx of adenomatous colonic polyps 2008   due for surveillance 2017   Hypercholesterolemia    IBS (irritable bowel syndrome)    Iron  deficiency anemia 03/13/2022   Irritable bowel syndrome 01/04/2009   Qualifier: Diagnosis of  By: Ezzard DEVONNA Sonny GORMAN.    Knee pain    Left forearm pain 08/15/2017   Leukopenia 11/06/2015   Lichen planus    Narcotic addiction (HCC) 06/10/2011   [facility]- Oct 2012    Normal cardiac stress test 05/25/2011   [facility] [place]   Normal echocardiogram 05/25/2011   EF 55%, mild TR   Oral  lichen planus    Plaque psoriasis    RUQ pain 07/14/2012   Sjogren's disease    Status post dilation of esophageal narrowing    Substance abuse (HCC)    narcotic addiction   Thrombocytopenia 11/06/2015   Trigger middle finger of left hand 06/20/2012    Past Surgical History:  Procedure Laterality Date   24 HOUR PH STUDY  10/21/2020   Procedure: 24 HOUR PH STUDY;  Surgeon: Shila Gustav GAILS, MD;  Location: [facility];  Service: Endoscopy;;   APPENDECTOMY     BALLOON DILATION N/A 05/28/2020   Procedure: MERRILL HODGKIN;  Surgeon: Cindie Carlin POUR, DO;  [facility];  Service: Endoscopy;  Laterality: N/A;   BIOPSY  05/28/2020   Procedure: BIOPSY;  Surgeon: Cindie Carlin POUR, DO;  [facility];  Service: Endoscopy;;   BIOPSY  09/07/2022   Procedure: BIOPSY;  Surgeon: Cindie Carlin POUR, DO;  [facility];  Service: Endoscopy;;   CARPAL TUNNEL RELEASE     rt   COLONOSCOPY  2008 [facility] ARS D100 V8 PHEN 12.5   2 SIMPLE ADENOMAS (< 1 CM)   COLONOSCOPY WITH PROPOFOL  N/A 12/05/2019   TI normal, 3 mm polyp in ascending  colon, external and internal hemorrhoids. Polyp removed but not retrieved. Poor prep   COLONOSCOPY WITH PROPOFOL  N/A 09/07/2022   Procedure: COLONOSCOPY WITH PROPOFOL ;  Surgeon: Cindie Carlin POUR, DO;  [facility];  Service: Endoscopy;  Laterality: N/A;  9:15 AM   ESOPHAGEAL MANOMETRY N/A 10/21/2020   Procedure: ESOPHAGEAL MANOMETRY (EM);  Surgeon: Shila Gustav GAILS, MD;  Location: [facility];  Service: Endoscopy;  Laterality: N/A;   ESOPHAGOGASTRODUODENOSCOPY  04/26/2012   Dr. Harvey: Non-erosive gastritis (inflammation) was found in the gastric antrum but no H.pylori; multiple biopsies (duodenal bx negative for Celiac)/ The mucosa of the esophagus appeared normal   ESOPHAGOGASTRODUODENOSCOPY  07/28/2021   Reports that his health; normal exam s/p esophageal dilation, esophageal biopsies obtained and were benign.    ESOPHAGOGASTRODUODENOSCOPY (EGD) WITH PROPOFOL  N/A 12/05/2019   mild gastritis, duodenitis due to aspirin /NSAIDs.    ESOPHAGOGASTRODUODENOSCOPY (EGD) WITH PROPOFOL  N/A 05/28/2020   Procedure: ESOPHAGOGASTRODUODENOSCOPY (EGD) WITH PROPOFOL ;  Surgeon: Cindie Carlin POUR, DO;  [facility];  Service: Endoscopy;  Laterality: N/A;  2:30pm   ESOPHAGOGASTRODUODENOSCOPY (EGD) WITH PROPOFOL  N/A 09/07/2022   Procedure: ESOPHAGOGASTRODUODENOSCOPY (EGD) WITH PROPOFOL ;  Surgeon: Cindie Carlin POUR, DO;  [facility];  Service: Endoscopy;  Laterality: N/A;   EUS  09/08/2012   Dr. Teressa: CBD dilated but no stones. Query secondary to Sphincter of Oddi stenosis, ?dysfunction, but clinically without symptoms   FRACTURE SURGERY N/A    Phreesia 08/25/2020   HARDWARE REMOVAL Right 02/12/2014   Procedure: HARDWARE REMOVAL;  Surgeon: Alm DELENA Hummer, MD;  Location: [facility];  Service: Orthopedics;  Laterality: Right;   KNEE ARTHROSCOPY     Neurostimulator implant     POLYPECTOMY  12/05/2019   Procedure: POLYPECTOMY;  Surgeon: Harvey Margo CROME, MD;  [facility];  Service: Endoscopy;;   POLYPECTOMY  09/07/2022   Procedure: POLYPECTOMY;  Surgeon: Cindie Carlin POUR, DO;  [facility];  Service: Endoscopy;;   right ring finger     spinal stenosis, had screws put in neck  04/11/2009   DR KRITZER   SPINE SURGERY     TONSILLECTOMY     ULNA OSTEOTOMY Right 02/12/2014   Procedure: RIGHT ULNAR SHORTENING AND OSTEOTOMY ;  Surgeon: Alm DELENA Hummer, MD;  Location: [facility];  Service: Orthopedics;  Laterality: Right;   ULNAR NERVE TRANSPOSITION     UPPER GASTROINTESTINAL ENDOSCOPY  2008 SLF ABD PAIN WEIGHT LOSS d100 v8 phen 12.5   NL   WRIST SURGERY Right 08/2013   Dr. Evalene Chancy    Allergies  Allergen Reactions   Levofloxacin Nausea Only and Other (See Comments)    Creepy feeling, skin didn't feel right, sort of itchy.   Ciprofloxacin      Sweating   Sulfa  Antibiotics      Physical Exam: General: The patient is alert and oriented x3 in no acute distress.  Dermatology: Skin is warm, dry and supple bilateral lower extremities.   Vascular: Palpable pedal pulses bilaterally. Capillary refill within normal limits.  No appreciable edema.  No erythema.  Neurological: Grossly intact via light touch  Musculoskeletal Exam: High arches noted bilateral  Radiographic Exam B/L feet 06/05/2024:  Normal osseous mineralization. Joint spaces preserved.  No fractures or osseous irregularities noted.  Assessment/Plan of Care: 1.  Chronic bilateral foot pain 2.  High arches/cavus foot type bilateral 3.  Symptomatic calluses bilateral feet  -Patient evaluated.  X-rays reviewed -I do believe the best long-term solution  for the patient would be custom molded orthotics.  He walks approximately 12 miles per day for exercise.  I do believe orthotics would help alleviate a lot of the patient's foot pain as well as support the feet and assist in his gait -Patient was molded today for new custom molded orthotics -Continue good supportive tennis shoes.  Currently wears a pair of new balance sneakers -Continue to refrain from going barefoot -Return to clinic PRN     Thresa EMERSON Sar, DPM [facility]  Dr. Thresa EMERSON Sar, DPM    [address] [place], [place] [address]                Office [phone]  Fax [phone]

## 2024-06-06 ENCOUNTER — Encounter: Payer: Self-pay | Admitting: Internal Medicine

## 2024-06-06 ENCOUNTER — Encounter: Payer: Self-pay | Admitting: Family Medicine

## 2024-06-06 DIAGNOSIS — R1310 Dysphagia, unspecified: Secondary | ICD-10-CM

## 2024-06-06 DIAGNOSIS — K219 Gastro-esophageal reflux disease without esophagitis: Secondary | ICD-10-CM

## 2024-06-07 ENCOUNTER — Other Ambulatory Visit: Payer: Self-pay | Admitting: Internal Medicine

## 2024-06-12 ENCOUNTER — Other Ambulatory Visit: Payer: Self-pay | Admitting: Family Medicine

## 2024-07-03 ENCOUNTER — Other Ambulatory Visit: Payer: Self-pay | Admitting: Internal Medicine

## 2024-07-03 ENCOUNTER — Other Ambulatory Visit

## 2024-07-03 ENCOUNTER — Ambulatory Visit: Admitting: Podiatry

## 2024-07-03 DIAGNOSIS — M216X2 Other acquired deformities of left foot: Secondary | ICD-10-CM | POA: Diagnosis not present

## 2024-07-03 DIAGNOSIS — M216X1 Other acquired deformities of right foot: Secondary | ICD-10-CM | POA: Diagnosis not present

## 2024-07-03 MED ORDER — ONDANSETRON HCL 4 MG PO TABS: ORAL_TABLET | ORAL | 0 refills | Status: AC

## 2024-07-03 NOTE — Progress Notes (Signed)
 Chief Complaint  Patient presents with   Foot Orthotics    Pt here for orthotics fitting.  Not diabetic.  No anti coag     HPI: 68 y.o. male presenting today as a new patient for above complaint  Past Medical History:  Diagnosis Date   Acute encephalopathy 12/29/2017   Hospitalized 5/22 to 5/23 with acute encephalopathy, unclear etiology   Acute gout involving toe of right foot 09/14/2019   Alcoholism (HCC)    Allergy    Annual visit for general adult medical examination with abnormal findings 06/04/2020   Anxiety    Arthritis    Back problem    Cataract 01/2021   Chest wall pain 03/28/2017   Chronic hepatitis C without hepatic coma (HCC) 01/04/2009   Qualifier: Diagnosis of  By: Ezzard DEVONNA Sonny GORMAN. 2008: CT A/P NO CIRRHOSIS AUG 2013 MRCP- DILATED CBD/NO CIRRHOSIS DEC 2013 AST 99-102  ALT 145-156 PLT 147 HB 13.4 ALB 4.4-4.7 T BIL 0.7    Depression    Dilation of biliary tract 07/14/2012   EUS JAN 2014 BENIGN CBD DILATION    Epigastric pain 07/14/2012   GERD (gastroesophageal reflux disease)    Gout    HCV (hepatitis C virus) 06/16/2015   Hepatitis B    Hepatitis C 1979   Hepatitis C reactive    LIVER BX 2008-CHRONIC ACTIVE HEPATITIS   HTN (hypertension)    Hx of adenomatous colonic polyps 2008   due for surveillance 2017   Hypercholesterolemia    IBS (irritable bowel syndrome)    Iron  deficiency anemia 03/13/2022   Irritable bowel syndrome 01/04/2009   Qualifier: Diagnosis of  By: Ezzard DEVONNA Sonny GORMAN.    Knee pain    Left forearm pain 08/15/2017   Leukopenia 11/06/2015   Lichen planus    Narcotic addiction (HCC) 06/10/2011   [facility]- Oct 2012    Normal cardiac stress test 05/25/2011   [facility] [place]   Normal echocardiogram 05/25/2011   EF 55%, mild TR   Oral lichen planus    Plaque psoriasis    RUQ pain 07/14/2012   Sjogren's disease    Status post dilation of esophageal narrowing    Substance abuse (HCC)    narcotic  addiction   Thrombocytopenia 11/06/2015   Trigger middle finger of left hand 06/20/2012    Past Surgical History:  Procedure Laterality Date   24 HOUR PH STUDY  10/21/2020   Procedure: 24 HOUR PH STUDY;  Surgeon: Shila Gustav GAILS, MD;  Location: [facility];  Service: Endoscopy;;   APPENDECTOMY     BALLOON DILATION N/A 05/28/2020   Procedure: MERRILL HODGKIN;  Surgeon: Cindie Carlin POUR, DO;  [facility]: [facility];  Service: Endoscopy;  Laterality: N/A;   BIOPSY  05/28/2020   Procedure: BIOPSY;  Surgeon: Cindie Carlin POUR, DO;  [facility]: [facility];  Service: Endoscopy;;   BIOPSY  09/07/2022   Procedure: BIOPSY;  Surgeon: Cindie Carlin POUR, DO;  [facility];  Service: Endoscopy;;   CARPAL TUNNEL RELEASE     rt   COLONOSCOPY  2008 [facility] ARS D100 V8 PHEN 12.5   2 SIMPLE ADENOMAS (< 1 CM)   COLONOSCOPY WITH PROPOFOL  N/A 12/05/2019   TI normal, 3 mm polyp in ascending colon, external and internal hemorrhoids. Polyp removed but not retrieved. Poor prep   COLONOSCOPY WITH PROPOFOL  N/A 09/07/2022   Procedure: COLONOSCOPY WITH PROPOFOL ;  Surgeon: Cindie Carlin POUR, DO;  [facility];  Service: Endoscopy;  Laterality: N/A;  9:15 AM   ESOPHAGEAL MANOMETRY N/A 10/21/2020   Procedure: ESOPHAGEAL MANOMETRY (EM);  Surgeon: Shila Gustav GAILS, MD;  Location: [facility];  Service: Endoscopy;  Laterality: N/A;   ESOPHAGOGASTRODUODENOSCOPY  04/26/2012   Dr. Harvey: Non-erosive gastritis (inflammation) was found in the gastric antrum but no H.pylori; multiple biopsies (duodenal bx negative for Celiac)/ The mucosa of the esophagus appeared normal   ESOPHAGOGASTRODUODENOSCOPY  07/28/2021   Reports that his health; normal exam s/p esophageal dilation, esophageal biopsies obtained and were benign.   ESOPHAGOGASTRODUODENOSCOPY (EGD) WITH PROPOFOL  N/A 12/05/2019   mild gastritis, duodenitis due to aspirin /NSAIDs.    ESOPHAGOGASTRODUODENOSCOPY (EGD) WITH PROPOFOL  N/A 05/28/2020    Procedure: ESOPHAGOGASTRODUODENOSCOPY (EGD) WITH PROPOFOL ;  Surgeon: Cindie Carlin POUR, DO;  [facility];  Service: Endoscopy;  Laterality: N/A;  2:30pm   ESOPHAGOGASTRODUODENOSCOPY (EGD) WITH PROPOFOL  N/A 09/07/2022   Procedure: ESOPHAGOGASTRODUODENOSCOPY (EGD) WITH PROPOFOL ;  Surgeon: Cindie Carlin POUR, DO;  [facility];  Service: Endoscopy;  Laterality: N/A;   EUS  09/08/2012   Dr. Teressa: CBD dilated but no stones. Query secondary to Sphincter of Oddi stenosis, ?dysfunction, but clinically without symptoms   FRACTURE SURGERY N/A    [employer] 08/25/2020   HARDWARE REMOVAL Right 02/12/2014   Procedure: HARDWARE REMOVAL;  Surgeon: Alm DELENA Hummer, MD;  Location: [facility];  Service: Orthopedics;  Laterality: Right;   KNEE ARTHROSCOPY     Neurostimulator implant     POLYPECTOMY  12/05/2019   Procedure: POLYPECTOMY;  Surgeon: Harvey Margo CROME, MD;  [facility];  Service: Endoscopy;;   POLYPECTOMY  09/07/2022   Procedure: POLYPECTOMY;  Surgeon: Cindie Carlin POUR, DO;  [facility];  Service: Endoscopy;;   right ring finger     spinal stenosis, had screws put in neck  04/11/2009   DR KRITZER   SPINE SURGERY     TONSILLECTOMY     ULNA OSTEOTOMY Right 02/12/2014   Procedure: RIGHT ULNAR SHORTENING AND OSTEOTOMY ;  Surgeon: Alm DELENA Hummer, MD;  Location: [facility];  Service: Orthopedics;  Laterality: Right;   ULNAR NERVE TRANSPOSITION     UPPER GASTROINTESTINAL ENDOSCOPY  2008 [facility] ABD PAIN WEIGHT LOSS d100 v8 phen 12.5   NL   WRIST SURGERY Right 08/2013   Dr. Evalene Chancy    Allergies  Allergen Reactions   Levofloxacin Nausea Only and Other (See Comments)    Creepy feeling, skin didn't feel right, sort of itchy.   Ciprofloxacin     Sweating   Sulfa  Antibiotics      Physical Exam: General: The patient is alert and oriented x3 in no acute distress.  Dermatology: Skin is warm, dry and supple bilateral  lower extremities.   Vascular: Palpable pedal pulses bilaterally. Capillary refill within normal limits.  No appreciable edema.  No erythema.  Neurological: Grossly intact via light touch  Musculoskeletal Exam: High arches noted bilateral  Radiographic Exam B/L feet 06/05/2024:  Normal osseous mineralization. Joint spaces preserved.  No fractures or osseous irregularities noted.  Assessment/Plan of Care: 1.  Chronic bilateral foot pain 2.  High arches/cavus foot type bilateral 3.  Symptomatic calluses bilateral feet  -Patient evaluated.  X-rays reviewed -I do believe the best long-term solution for the patient would be custom molded orthotics.  He walks approximately 12 miles per day for exercise.  I do believe orthotics would help alleviate a lot of the patient's foot pain as  well as support the feet and assist in his gait -Patient was molded today for new custom molded orthotics -Continue good supportive tennis shoes.  Currently wears a pair of new balance sneakers -Continue to refrain from going barefoot -Return to clinic PRN     Thresa EMERSON Sar, DPM [facility]  Dr. Thresa EMERSON Sar, DPM    [address] [place], [place] [address]                Office [phone]  Fax [phone]

## 2024-07-03 NOTE — Patient Instructions (Signed)

## 2024-07-03 NOTE — Addendum Note (Signed)
 Addended by: ANTONETTA ROLLENE BRAVO on: 07/03/2024 09:21 AM   Modules accepted: Orders

## 2024-07-04 ENCOUNTER — Encounter: Admitting: Family Medicine

## 2024-07-10 ENCOUNTER — Other Ambulatory Visit: Payer: Self-pay | Admitting: Family Medicine

## 2024-07-19 ENCOUNTER — Other Ambulatory Visit: Payer: Self-pay | Admitting: Gastroenterology

## 2024-07-19 DIAGNOSIS — K219 Gastro-esophageal reflux disease without esophagitis: Secondary | ICD-10-CM

## 2024-07-20 ENCOUNTER — Telehealth: Payer: Self-pay | Admitting: Family Medicine

## 2024-07-20 ENCOUNTER — Ambulatory Visit

## 2024-07-20 DIAGNOSIS — F322 Major depressive disorder, single episode, severe without psychotic features: Secondary | ICD-10-CM

## 2024-07-20 DIAGNOSIS — F411 Generalized anxiety disorder: Secondary | ICD-10-CM

## 2024-07-20 NOTE — Telephone Encounter (Signed)
 Copied from CRM #[id number]. Topic: General - Other >> Jul 20, 2024  2:07 PM Emylou G wrote: Reason for CRM: April w/[facility] called about the referral rcvd.. they only accept ages from 12-65.SABRA they have to deny the referral at this time

## 2024-07-21 NOTE — Addendum Note (Signed)
 Addended by: ANTONETTA QUANT E on: 07/21/2024 12:46 PM   Modules accepted: Orders

## 2024-07-21 NOTE — Telephone Encounter (Signed)
 PLS LET PT KNOW UNable to be seen at [facility] based on age  as discussed in note I  have therefore referred him to psych in [place] system and he should expect a call from the psych office with a[pt info

## 2024-08-01 ENCOUNTER — Other Ambulatory Visit: Payer: Self-pay | Admitting: Family Medicine

## 2024-08-09 ENCOUNTER — Other Ambulatory Visit: Payer: Self-pay | Admitting: Internal Medicine

## 2024-08-09 DIAGNOSIS — F331 Major depressive disorder, recurrent, moderate: Secondary | ICD-10-CM

## 2024-08-10 ENCOUNTER — Encounter: Payer: Self-pay | Admitting: Gastroenterology

## 2024-08-11 ENCOUNTER — Other Ambulatory Visit: Payer: Self-pay | Admitting: Family Medicine

## 2024-09-08 ENCOUNTER — Ambulatory Visit

## 2024-09-13 ENCOUNTER — Encounter: Payer: Self-pay | Admitting: Family Medicine

## 2024-09-14 ENCOUNTER — Encounter: Admitting: Family Medicine

## 2024-10-23 ENCOUNTER — Ambulatory Visit (HOSPITAL_COMMUNITY): Admitting: Psychiatry

## 2024-11-16 ENCOUNTER — Encounter: Admitting: Family Medicine

## 2024-11-20 ENCOUNTER — Inpatient Hospital Stay

## 2024-11-27 ENCOUNTER — Inpatient Hospital Stay: Admitting: Physician Assistant
